# Patient Record
Sex: Male | Born: 1955
Health system: Southern US, Community
[De-identification: ages and names within clinical notes are randomized; demographics above are authoritative.]

## PROBLEM LIST (undated history)

## (undated) DIAGNOSIS — E119 Type 2 diabetes mellitus without complications: Secondary | ICD-10-CM

## (undated) DIAGNOSIS — Z972 Presence of dental prosthetic device (complete) (partial): Secondary | ICD-10-CM

## (undated) DIAGNOSIS — R350 Frequency of micturition: Secondary | ICD-10-CM

## (undated) DIAGNOSIS — I10 Essential (primary) hypertension: Secondary | ICD-10-CM

## (undated) DIAGNOSIS — R3915 Urgency of urination: Secondary | ICD-10-CM

## (undated) DIAGNOSIS — Q631 Lobulated, fused and horseshoe kidney: Secondary | ICD-10-CM

## (undated) DIAGNOSIS — K219 Gastro-esophageal reflux disease without esophagitis: Secondary | ICD-10-CM

## (undated) DIAGNOSIS — N200 Calculus of kidney: Secondary | ICD-10-CM

---

## 2003-06-05 HISTORY — PX: PERCUTANEOUS NEPHROLITHOTRIPSY: SHX2206

## 2013-08-14 ENCOUNTER — Telehealth: Payer: Self-pay | Admitting: Family Medicine

## 2013-08-14 NOTE — Telephone Encounter (Signed)
appt on the 18

## 2013-08-19 ENCOUNTER — Ambulatory Visit (INDEPENDENT_AMBULATORY_CARE_PROVIDER_SITE_OTHER): Payer: BC Managed Care – PPO | Admitting: Family Medicine

## 2013-08-19 VITALS — BP 150/80 | HR 67 | Temp 97.1°F | Ht 72.0 in | Wt 159.0 lb

## 2013-08-19 DIAGNOSIS — Z139 Encounter for screening, unspecified: Secondary | ICD-10-CM

## 2013-08-19 DIAGNOSIS — K219 Gastro-esophageal reflux disease without esophagitis: Secondary | ICD-10-CM

## 2013-08-19 DIAGNOSIS — I10 Essential (primary) hypertension: Secondary | ICD-10-CM

## 2013-08-19 DIAGNOSIS — Z0000 Encounter for general adult medical examination without abnormal findings: Secondary | ICD-10-CM

## 2013-08-19 DIAGNOSIS — R319 Hematuria, unspecified: Secondary | ICD-10-CM

## 2013-08-19 DIAGNOSIS — E119 Type 2 diabetes mellitus without complications: Secondary | ICD-10-CM

## 2013-08-19 LAB — POCT UA - MICROSCOPIC ONLY
Bacteria, U Microscopic: NEGATIVE
Casts, Ur, LPF, POC: NEGATIVE
Crystals, Ur, HPF, POC: NEGATIVE
Mucus, UA: NEGATIVE
WBC, Ur, HPF, POC: NEGATIVE
Yeast, UA: NEGATIVE

## 2013-08-19 LAB — POCT URINALYSIS DIPSTICK
Bilirubin, UA: NEGATIVE
Glucose, UA: NEGATIVE
Ketones, UA: NEGATIVE
Leukocytes, UA: NEGATIVE
Nitrite, UA: NEGATIVE
Protein, UA: NEGATIVE
Spec Grav, UA: 1.01
Urobilinogen, UA: NEGATIVE
pH, UA: 6.5

## 2013-08-19 LAB — POCT UA - MICROALBUMIN: Microalbumin Ur, POC: 20 mg/L

## 2013-08-19 LAB — POCT CBC
Granulocyte percent: 62 %G (ref 37–80)
HCT, POC: 41 % — AB (ref 43.5–53.7)
Hemoglobin: 13 g/dL — AB (ref 14.1–18.1)
Lymph, poc: 1.3 (ref 0.6–3.4)
MCH, POC: 27.2 pg (ref 27–31.2)
MCHC: 31.7 g/dL — AB (ref 31.8–35.4)
MCV: 85.5 fL (ref 80–97)
MPV: 7.3 fL (ref 0–99.8)
POC Granulocyte: 2.5 (ref 2–6.9)
POC LYMPH PERCENT: 33.7 %L (ref 10–50)
Platelet Count, POC: 182 10*3/uL (ref 142–424)
RBC: 4.8 M/uL (ref 4.69–6.13)
RDW, POC: 13.7 %
WBC: 4 10*3/uL — AB (ref 4.6–10.2)

## 2013-08-19 LAB — GLUCOSE, POCT (MANUAL RESULT ENTRY): POC Glucose: 401 mg/dl — AB (ref 70–99)

## 2013-08-19 LAB — POCT GLYCOSYLATED HEMOGLOBIN (HGB A1C): Hemoglobin A1C: 13

## 2013-08-19 MED ORDER — LISINOPRIL 10 MG PO TABS: 10.0000 mg | ORAL_TABLET | Freq: Every day | ORAL | Status: DC

## 2013-08-19 NOTE — Progress Notes (Signed)
Subjective:    Patient ID: Philip Richardson, male    DOB: 28-Nov-1955, 58 y.o.   MRN: [id number]  HPI  This 58 y.o. male presents for evaluation of CPE. o He has hx of diabetes and hypertension and he quit taking his meds because it made him feel funny.  He has not had colonoscopy. He has been feeling sluggish and he has been losing weight.  He has been taking some keflex and  tobrex eye gtt's for a stye on his left eye.  Review of Systems C/o stye left eye and feeling sluggish.   No chest pain, SOB, HA, dizziness, vision change, N/V, diarrhea, constipation, dysuria, urinary urgency or frequency, myalgias, arthralgias or rash.  Objective:   Physical Exam Vital signs noted  Well developed well nourished male.  HEENT - Head atraumatic Normocephalic                Eyes - PERRLA, Conjuctiva - clear Sclera- Clear EOMI.  Stye left eye                Ears - EAC's Wnl TM's Wnl Gross Hearing WNL                Nose - Nares patent                 Throat - oropharanx wnl Respiratory - Lungs CTA bilateral Cardiac - RRR S1 and S2 without murmur GI - Abdomen soft Nontender and bowel sounds active x 4 Extremities - No edema. Neuro - Grossly intact.   Results for orders placed in visit on 08/19/13  POCT GLYCOSYLATED HEMOGLOBIN (HGB A1C)      Result Value Ref Range   Hemoglobin A1C 13.0    POCT CBC      Result Value Ref Range   WBC 4.0 (*) 4.6 - 10.2 K/uL   Lymph, poc 1.3  0.6 - 3.4   POC LYMPH PERCENT 33.7  10 - 50 %L   POC Granulocyte 2.5  2 - 6.9   Granulocyte percent 62.0  37 - 80 %G   RBC 4.8  4.69 - 6.13 M/uL   Hemoglobin 13.0 (*) 14.1 - 18.1 g/dL   HCT, POC 41.0 (*) 43.5 - 53.7 %   MCV 85.5  80 - 97 fL   MCH, POC 27.2  27 - 31.2 pg   MCHC 31.7 (*) 31.8 - 35.4 g/dL   RDW, POC 13.7     Platelet Count, POC 182.0  142 - 424 K/uL   MPV 7.3  0 - 99.8 fL  POCT UA - MICROALBUMIN      Result Value Ref Range   Microalbumin Ur, POC 20    POCT UA - MICROSCOPIC ONLY      Result Value  Ref Range   WBC, Ur, HPF, POC neg     RBC, urine, microscopic 30-40     Bacteria, U Microscopic neg     Mucus, UA neg     Epithelial cells, urine per micros occ     Crystals, Ur, HPF, POC neg     Casts, Ur, LPF, POC neg     Yeast, UA neg    POCT URINALYSIS DIPSTICK      Result Value Ref Range   Color, UA yellow     Clarity, UA clear     Glucose, UA neg     Bilirubin, UA neg     Ketones, UA neg     Spec Grav, UA 1.010  Blood, UA large     pH, UA 6.5     Protein, UA neg     Urobilinogen, UA negative     Nitrite, UA neg     Leukocytes, UA Negative    GLUCOSE, POCT (MANUAL RESULT ENTRY)      Result Value Ref Range   POC Glucose 401 (*) 70 - 99 mg/dl      Assessment & Plan:  Screening - Plan: Ambulatory referral to Gastroenterology for screening colonoscopy  Diabetes - Plan: POCT glycosylated hemoglobin (Hb A1C), POCT UA - Microalbumin, lisinopril (PRINIVIL,ZESTRIL) 10 MG tablet Novolog Pen for SSI bid - For fsbs greater than 200 give 6 units sq, for fsbs > 300 give 8 units and for  fsbs >400 give 10 units sq.  Need to make an appointment to see Tammy Eckard or [place] pharmacist for diabetes visit next week and then follow up here with me in 2 weeks.   He is able to give himself 10 unit of humalog insulin sq here in the office.  Hematuria - He has hx of kidney stones and will refer to Urology.  Essential hypertension, benign - Plan: POCT CBC, CMP14+EGFR, lisinopril (PRINIVIL,ZESTRIL) 10 MG tablet  GERD (gastroesophageal reflux disease) - Plan: Vitamin B12 and refuses rx for GERD.  He states he will take something otc  Routine general medical examination at a health care facility - Plan: POCT CBC, CMP14+EGFR, Lipid panel, Thyroid Panel With TSH, PSA, total and free, Vit D  25 hydroxy (rtn osteoporosis monitoring), Vitamin B12, POCT UA - Microscopic Only, POCT urinalysis dipstick.  Spent over 45 minutes in visit with patient today.  Lysbeth Penner FNP

## 2013-08-20 LAB — CMP14+EGFR
ALT: 17 IU/L (ref 0–44)
AST: 15 IU/L (ref 0–40)
Albumin/Globulin Ratio: 1.6 (ref 1.1–2.5)
Albumin: 4 g/dL (ref 3.5–5.5)
Alkaline Phosphatase: 97 IU/L (ref 39–117)
BUN/Creatinine Ratio: 9 (ref 9–20)
BUN: 11 mg/dL (ref 6–24)
CO2: 26 mmol/L (ref 18–29)
Calcium: 9.5 mg/dL (ref 8.7–10.2)
Chloride: 94 mmol/L — ABNORMAL LOW (ref 97–108)
Creatinine, Ser: 1.18 mg/dL (ref 0.76–1.27)
GFR calc Af Amer: 79 mL/min/{1.73_m2} (ref >59)
GFR calc non Af Amer: 68 mL/min/{1.73_m2} (ref >59)
Globulin, Total: 2.5 g/dL (ref 1.5–4.5)
Glucose: 423 mg/dL (ref 65–99)
Potassium: 5 mmol/L (ref 3.5–5.2)
Sodium: 137 mmol/L (ref 134–144)
Total Bilirubin: 0.4 mg/dL (ref 0.0–1.2)
Total Protein: 6.5 g/dL (ref 6.0–8.5)

## 2013-08-20 LAB — THYROID PANEL WITH TSH
Free Thyroxine Index: 2.1 (ref 1.2–4.9)
T3 Uptake Ratio: 28 % (ref 24–39)
T4, Total: 7.4 ug/dL (ref 4.5–12.0)
TSH: 0.582 u[IU]/mL (ref 0.450–4.500)

## 2013-08-20 LAB — PSA, TOTAL AND FREE
PSA, Free Pct: 36 %
PSA, Free: 0.18 ng/mL
PSA: 0.5 ng/mL (ref 0.0–4.0)

## 2013-08-20 LAB — LIPID PANEL
Chol/HDL Ratio: 3.7 ratio units (ref 0.0–5.0)
Cholesterol, Total: 200 mg/dL — ABNORMAL HIGH (ref 100–199)
HDL: 54 mg/dL (ref >39)
LDL Calculated: 119 mg/dL — ABNORMAL HIGH (ref 0–99)
Triglycerides: 133 mg/dL (ref 0–149)
VLDL Cholesterol Cal: 27 mg/dL (ref 5–40)

## 2013-08-20 LAB — MICROALBUMIN, URINE: Microalbumin, Urine: 64.1 ug/mL — ABNORMAL HIGH (ref 0.0–17.0)

## 2013-08-20 LAB — VITAMIN B12: Vitamin B-12: 675 pg/mL (ref 211–946)

## 2013-08-20 LAB — VITAMIN D 25 HYDROXY (VIT D DEFICIENCY, FRACTURES): Vit D, 25-Hydroxy: 44.4 ng/mL (ref 30.0–100.0)

## 2013-08-21 ENCOUNTER — Other Ambulatory Visit: Payer: Self-pay | Admitting: Family Medicine

## 2013-08-21 ENCOUNTER — Telehealth: Payer: Self-pay | Admitting: Family Medicine

## 2013-08-21 MED ORDER — PRAVASTATIN SODIUM 40 MG PO TABS: 40.0000 mg | ORAL_TABLET | Freq: Every day | ORAL | Status: DC

## 2013-08-21 NOTE — Telephone Encounter (Signed)
Message copied by Waverly Ferrari on Fri Aug 21, 2013  3:24 PM ------      Message from: Lysbeth Penner      Created: Fri Aug 21, 2013  1:12 PM       Start pravachol for elevated cholesterol since he is diabetic ------

## 2013-08-25 ENCOUNTER — Ambulatory Visit (INDEPENDENT_AMBULATORY_CARE_PROVIDER_SITE_OTHER): Payer: BC Managed Care – PPO | Admitting: Pharmacist Clinician (PhC)/ Clinical Pharmacy Specialist

## 2013-08-25 DIAGNOSIS — IMO0001 Reserved for inherently not codable concepts without codable children: Secondary | ICD-10-CM

## 2013-08-25 DIAGNOSIS — E1165 Type 2 diabetes mellitus with hyperglycemia: Secondary | ICD-10-CM

## 2013-08-25 DIAGNOSIS — IMO0002 Reserved for concepts with insufficient information to code with codable children: Secondary | ICD-10-CM

## 2013-08-25 NOTE — Progress Notes (Signed)
Patient came in today at 11:55 for an 11:30 appointment and stated he had to be across town at the eye doctor by 12:30.  He was upset and frustrated that he did not know how to use his insulin pens or blood glucometer.  I went through slowly with him all the steps for the monitor and pen injection.  He gave himself a shot of novolog 10 units in office since his BG was 340mg /dL.  He had now taken any insulin since last week because he did not know how to check his blood sugar.  Patient asked many good questions.  I also reveiwed briefly a diabetic diet and CHO meal planning and printed a diet out for him to follow.  We talked about exercise as well.  He would like to get off insulin in the future and take oral medications, which I think is very doable.  He comes back next week to see Bill.

## 2013-08-26 ENCOUNTER — Encounter (INDEPENDENT_AMBULATORY_CARE_PROVIDER_SITE_OTHER): Payer: Self-pay | Admitting: *Deleted

## 2013-08-29 LAB — HM DIABETES EYE EXAM

## 2013-09-01 ENCOUNTER — Ambulatory Visit: Payer: Self-pay | Admitting: Family Medicine

## 2013-09-02 ENCOUNTER — Ambulatory Visit: Payer: Self-pay | Admitting: Family Medicine

## 2013-09-08 ENCOUNTER — Ambulatory Visit: Payer: Self-pay | Admitting: Family Medicine

## 2013-09-09 ENCOUNTER — Ambulatory Visit (INDEPENDENT_AMBULATORY_CARE_PROVIDER_SITE_OTHER): Payer: BC Managed Care – PPO | Admitting: General Practice

## 2013-09-09 ENCOUNTER — Ambulatory Visit: Payer: BC Managed Care – PPO | Admitting: Family Medicine

## 2013-09-09 ENCOUNTER — Encounter: Payer: Self-pay | Admitting: General Practice

## 2013-09-09 VITALS — BP 133/82 | HR 60 | Temp 97.6°F | Ht 72.0 in | Wt 160.6 lb

## 2013-09-09 DIAGNOSIS — E119 Type 2 diabetes mellitus without complications: Secondary | ICD-10-CM

## 2013-09-09 MED ORDER — LINAGLIPTIN-METFORMIN HCL 2.5-1000 MG PO TABS: 1.0000 | ORAL_TABLET | Freq: Two times a day (BID) | ORAL | Status: DC

## 2013-09-09 NOTE — Patient Instructions (Signed)
Diabetes and Foot Care Diabetes may cause you to have problems because of poor blood supply (circulation) to your feet and legs. This may cause the skin on your feet to become thinner, break easier, and heal more slowly. Your skin may become dry, and the skin may peel and crack. You may also have nerve damage in your legs and feet causing decreased feeling in them. You may not notice minor injuries to your feet that could lead to infections or more serious problems. Taking care of your feet is one of the most important things you can do for yourself.  HOME CARE INSTRUCTIONS  Wear shoes at all times, even in the house. Do not go barefoot. Bare feet are easily injured.  Check your feet daily for blisters, cuts, and redness. If you cannot see the bottom of your feet, use a mirror or ask someone for help.  Wash your feet with warm water (do not use hot water) and mild soap. Then pat your feet and the areas between your toes until they are completely dry. Do not soak your feet as this can dry your skin.  Apply a moisturizing lotion or petroleum jelly (that does not contain alcohol and is unscented) to the skin on your feet and to dry, brittle toenails. Do not apply lotion between your toes.  Trim your toenails straight across. Do not dig under them or around the cuticle. File the edges of your nails with an emery board or nail file.  Do not cut corns or calluses or try to remove them with medicine.  Wear clean socks or stockings every day. Make sure they are not too tight. Do not wear knee-high stockings since they may decrease blood flow to your legs.  Wear shoes that fit properly and have enough cushioning. To break in new shoes, wear them for just a few hours a day. This prevents you from injuring your feet. Always look in your shoes before you put them on to be sure there are no objects inside.  Do not cross your legs. This may decrease the blood flow to your feet.  If you find a minor scrape,  cut, or break in the skin on your feet, keep it and the skin around it clean and dry. These areas may be cleansed with mild soap and water. Do not cleanse the area with peroxide, alcohol, or iodine.  When you remove an adhesive bandage, be sure not to damage the skin around it.  If you have a wound, look at it several times a day to make sure it is healing.  Do not use heating pads or hot water bottles. They may burn your skin. If you have lost feeling in your feet or legs, you may not know it is happening until it is too late.  Make sure your health care provider performs a complete foot exam at least annually or more often if you have foot problems. Report any cuts, sores, or bruises to your health care provider immediately. SEEK MEDICAL CARE IF:   You have an injury that is not healing.  You have cuts or breaks in the skin.  You have an ingrown nail.  You notice redness on your legs or feet.  You feel burning or tingling in your legs or feet.  You have pain or cramps in your legs and feet.  Your legs or feet are numb.  Your feet always feel cold. SEEK IMMEDIATE MEDICAL CARE IF:   There is increasing redness,   swelling, or pain in or around a wound.  There is a red line that goes up your leg.  Pus is coming from a wound.  You develop a fever or as directed by your health care provider.  You notice a bad smell coming from an ulcer or wound. Document Released: 05/18/2000 Document Revised: 01/21/2013 Document Reviewed: 10/28/2012 Exit[employer] Patient Information 2014 [employer].  

## 2013-09-09 NOTE — Progress Notes (Signed)
   Subjective:    Patient ID: Philip Richardson, male    DOB: December 05, 1955, 58 y.o.   MRN: [id number]  HPI Patient presents today for follow up diabetes. Newly diagnosed on 08/19/13, taking blood sugars 2 times daily and ranges less than 200. He reports still no being comfortable with administering insulin. Also works two jobs and difficult to check blood sugars and administer insulin. Patient reports trying to eat healthier since speaking with pharmacist about nutrition.     Review of Systems  Constitutional: Negative for fever and chills.  Respiratory: Negative for chest tightness and shortness of breath.   Cardiovascular: Negative for chest pain and palpitations.       Objective:   Physical Exam  Constitutional: He is oriented to person, place, and time. He appears well-developed and well-nourished.  Cardiovascular: Normal rate, regular rhythm and normal heart sounds.   Pulmonary/Chest: Effort normal and breath sounds normal. No respiratory distress. He exhibits no tenderness.  Neurological: He is alert and oriented to person, place, and time.  Skin: Skin is warm and dry.  Psychiatric: He has a normal mood and affect.          Assessment & Plan:  1. Diabetes - Linagliptin-Metformin HCl 2.10-998 MG TABS; Take 1 tablet by mouth 2 (two) times daily with a meal.  Dispense: 56 tablet; Refill: 0 -discontinue insulin  -discussed keeping diary of blood sugars -discussed health eating and regular exercise -RTO in 1 month for labs and sooner if blood sugars elevated (as discussed) -discussed if HgbA1c hasn't improved, he may need to be restarted on insulin -Patient verbalized understanding and [place], FNP-C

## 2013-09-10 DIAGNOSIS — E119 Type 2 diabetes mellitus without complications: Secondary | ICD-10-CM | POA: Insufficient documentation

## 2013-09-23 ENCOUNTER — Other Ambulatory Visit: Payer: Self-pay | Admitting: Urology

## 2013-09-28 ENCOUNTER — Encounter (HOSPITAL_BASED_OUTPATIENT_CLINIC_OR_DEPARTMENT_OTHER): Payer: Self-pay | Admitting: *Deleted

## 2013-09-29 ENCOUNTER — Encounter (HOSPITAL_BASED_OUTPATIENT_CLINIC_OR_DEPARTMENT_OTHER): Payer: Self-pay | Admitting: *Deleted

## 2013-09-29 NOTE — Progress Notes (Signed)
NPO AFTER MN WITH EXCEPTION CLEAR LIQUIDS UNTIL 0900 (NO CREAM/ MILK PRODUCTS).  ARRIVE AT 1330. NEEDS ISTAT 8 AND EKG.

## 2013-09-30 ENCOUNTER — Other Ambulatory Visit: Payer: Self-pay

## 2013-09-30 ENCOUNTER — Ambulatory Visit (HOSPITAL_COMMUNITY): Payer: BC Managed Care – PPO

## 2013-09-30 ENCOUNTER — Encounter (HOSPITAL_BASED_OUTPATIENT_CLINIC_OR_DEPARTMENT_OTHER)
Admission: RE | Disposition: A | Discharge status: Home / Self Care | Payer: Self-pay | Source: Ambulatory Visit | Attending: Urology

## 2013-09-30 ENCOUNTER — Encounter (HOSPITAL_BASED_OUTPATIENT_CLINIC_OR_DEPARTMENT_OTHER): Payer: BC Managed Care – PPO | Admitting: Anesthesiology

## 2013-09-30 ENCOUNTER — Encounter (HOSPITAL_BASED_OUTPATIENT_CLINIC_OR_DEPARTMENT_OTHER): Payer: Self-pay

## 2013-09-30 ENCOUNTER — Ambulatory Visit (HOSPITAL_BASED_OUTPATIENT_CLINIC_OR_DEPARTMENT_OTHER): Payer: BC Managed Care – PPO | Admitting: Anesthesiology

## 2013-09-30 ENCOUNTER — Ambulatory Visit (HOSPITAL_BASED_OUTPATIENT_CLINIC_OR_DEPARTMENT_OTHER)
Admission: RE | Admit: 2013-09-30 | Discharge: 2013-09-30 | Disposition: A | Discharge status: Home / Self Care | Payer: BC Managed Care – PPO | Source: Ambulatory Visit | Attending: Urology | Admitting: Urology

## 2013-09-30 DIAGNOSIS — K219 Gastro-esophageal reflux disease without esophagitis: Secondary | ICD-10-CM | POA: Insufficient documentation

## 2013-09-30 DIAGNOSIS — I10 Essential (primary) hypertension: Secondary | ICD-10-CM | POA: Insufficient documentation

## 2013-09-30 DIAGNOSIS — Q638 Other specified congenital malformations of kidney: Secondary | ICD-10-CM | POA: Insufficient documentation

## 2013-09-30 DIAGNOSIS — N1330 Unspecified hydronephrosis: Secondary | ICD-10-CM | POA: Insufficient documentation

## 2013-09-30 DIAGNOSIS — E119 Type 2 diabetes mellitus without complications: Secondary | ICD-10-CM | POA: Insufficient documentation

## 2013-09-30 DIAGNOSIS — N200 Calculus of kidney: Secondary | ICD-10-CM | POA: Insufficient documentation

## 2013-09-30 HISTORY — DX: Lobulated, fused and horseshoe kidney: Q63.1

## 2013-09-30 HISTORY — DX: Gastro-esophageal reflux disease without esophagitis: K21.9

## 2013-09-30 HISTORY — DX: Presence of dental prosthetic device (complete) (partial): Z97.2

## 2013-09-30 HISTORY — DX: Essential (primary) hypertension: I10

## 2013-09-30 HISTORY — DX: Calculus of kidney: N20.0

## 2013-09-30 HISTORY — DX: Urgency of urination: R39.15

## 2013-09-30 HISTORY — DX: Type 2 diabetes mellitus without complications: E11.9

## 2013-09-30 HISTORY — PX: CYSTOSCOPY WITH RETROGRADE PYELOGRAM, URETEROSCOPY AND STENT PLACEMENT: SHX5789

## 2013-09-30 HISTORY — DX: Frequency of micturition: R35.0

## 2013-09-30 LAB — POCT I-STAT, CHEM 8
BUN: 12 mg/dL (ref 6–23)
CALCIUM ION: 0.93 mmol/L — AB (ref 1.12–1.23)
CHLORIDE: 105 meq/L (ref 96–112)
Creatinine, Ser: 1 mg/dL (ref 0.50–1.35)
Glucose, Bld: 139 mg/dL — ABNORMAL HIGH (ref 70–99)
HCT: 45 % (ref 39.0–52.0)
Hemoglobin: 15.3 g/dL (ref 13.0–17.0)
Potassium: 3.6 mEq/L — ABNORMAL LOW (ref 3.7–5.3)
SODIUM: 139 meq/L (ref 137–147)
TCO2: 21 mmol/L (ref 0–100)

## 2013-09-30 SURGERY — CYSTOURETEROSCOPY, WITH RETROGRADE PYELOGRAM AND STENT INSERTION
Anesthesia: General | Site: Ureter | Laterality: Bilateral

## 2013-09-30 MED ORDER — FENTANYL CITRATE 0.05 MG/ML IJ SOLN
INTRAMUSCULAR | Status: DC | PRN
  Administered 2013-09-30 (×2): 50 ug via INTRAVENOUS

## 2013-09-30 MED ORDER — IOHEXOL 300 MG/ML  SOLN
INTRAMUSCULAR | Status: DC | PRN
  Administered 2013-09-30: 35 mL via INTRAVENOUS

## 2013-09-30 MED ORDER — MIDAZOLAM HCL 2 MG/2ML IJ SOLN
INTRAMUSCULAR | Status: AC
  Filled 2013-09-30: qty 2

## 2013-09-30 MED ORDER — DEXTROSE 5 % IV SOLN
5.0000 mg/kg | Freq: Once | INTRAVENOUS | Status: AC
  Administered 2013-09-30: 350 mg via INTRAVENOUS
  Filled 2013-09-30: qty 8.75

## 2013-09-30 MED ORDER — MIDAZOLAM HCL 5 MG/5ML IJ SOLN
INTRAMUSCULAR | Status: DC | PRN
  Administered 2013-09-30: 2 mg via INTRAVENOUS

## 2013-09-30 MED ORDER — SODIUM CHLORIDE 0.9 % IR SOLN
Status: DC | PRN
  Administered 2013-09-30: 3000 mL via INTRAVESICAL

## 2013-09-30 MED ORDER — SENNOSIDES-DOCUSATE SODIUM 8.6-50 MG PO TABS: 1.0000 | ORAL_TABLET | Freq: Two times a day (BID) | ORAL | Status: DC

## 2013-09-30 MED ORDER — SULFAMETHOXAZOLE-TMP DS 800-160 MG PO TABS: 1.0000 | ORAL_TABLET | Freq: Two times a day (BID) | ORAL | Status: DC

## 2013-09-30 MED ORDER — OXYBUTYNIN CHLORIDE 5 MG PO TABS: 5.0000 mg | ORAL_TABLET | Freq: Three times a day (TID) | ORAL | Status: DC | PRN

## 2013-09-30 MED ORDER — LIDOCAINE HCL (CARDIAC) 20 MG/ML IV SOLN
INTRAVENOUS | Status: DC | PRN
  Administered 2013-09-30: 60 mg via INTRAVENOUS

## 2013-09-30 MED ORDER — FENTANYL CITRATE 0.05 MG/ML IJ SOLN
INTRAMUSCULAR | Status: AC
  Filled 2013-09-30: qty 4

## 2013-09-30 MED ORDER — ONDANSETRON HCL 4 MG/2ML IJ SOLN
INTRAMUSCULAR | Status: DC | PRN
  Administered 2013-09-30: 4 mg via INTRAVENOUS

## 2013-09-30 MED ORDER — OXYCODONE-ACETAMINOPHEN 5-325 MG PO TABS: 1.0000 | ORAL_TABLET | Freq: Four times a day (QID) | ORAL | Status: DC | PRN

## 2013-09-30 MED ORDER — LACTATED RINGERS IV SOLN
INTRAVENOUS | Status: DC
  Administered 2013-09-30 (×2): via INTRAVENOUS
  Filled 2013-09-30: qty 1000

## 2013-09-30 MED ORDER — PROPOFOL 10 MG/ML IV BOLUS
INTRAVENOUS | Status: DC | PRN
  Administered 2013-09-30: 200 mg via INTRAVENOUS

## 2013-09-30 MED ORDER — GENTAMICIN IN SALINE 1.6-0.9 MG/ML-% IV SOLN
80.0000 mg | INTRAVENOUS | Status: DC
  Filled 2013-09-30: qty 50

## 2013-09-30 SURGICAL SUPPLY — 20 items
6 X 26 CONTOUR ×2 IMPLANT
BAG DRAIN URO-CYSTO SKYTR STRL (DRAIN) ×2 IMPLANT
BASKET LASER NITINOL 1.9FR (BASKET) ×2 IMPLANT
BASKET ZERO TIP NITINOL 2.4FR (BASKET) IMPLANT
CANISTER SUCT LVC 12 LTR MEDI- (MISCELLANEOUS) ×2 IMPLANT
CATH INTERMIT  6FR 70CM (CATHETERS) ×2 IMPLANT
CLOTH BEACON ORANGE TIMEOUT ST (SAFETY) ×2 IMPLANT
DRAPE CAMERA CLOSED 9X96 (DRAPES) ×2 IMPLANT
GLOVE BIO SURGEON STRL SZ7.5 (GLOVE) ×2 IMPLANT
GLOVE INDICATOR 7.5 STRL GRN (GLOVE) ×2 IMPLANT
GLOVE SURG SS PI 7.5 STRL IVOR (GLOVE) ×2 IMPLANT
GOWN STRL REUS W/ TWL XL LVL3 (GOWN DISPOSABLE) ×1 IMPLANT
GOWN STRL REUS W/TWL XL LVL3 (GOWN DISPOSABLE) ×3 IMPLANT
GUIDEWIRE ANG ZIPWIRE 038X150 (WIRE) ×2 IMPLANT
GUIDEWIRE STR DUAL SENSOR (WIRE) ×2 IMPLANT
IV NS IRRIG 3000ML ARTHROMATIC (IV SOLUTION) ×2 IMPLANT
PACK CYSTOSCOPY (CUSTOM PROCEDURE TRAY) ×2 IMPLANT
STENT URET 6FRX26 CONTOUR (STENTS) ×2 IMPLANT
SYRINGE 10CC LL (SYRINGE) ×2 IMPLANT
TUBE FEEDING 8FR 16IN STR KANG (MISCELLANEOUS) ×2 IMPLANT

## 2013-09-30 NOTE — Anesthesia Postprocedure Evaluation (Signed)
Anesthesia Post Note  Patient: Philip Richardson  Procedure(s) Performed: Procedure(s) (LRB): CYSTOSCOPY WITH BILATERAL RETROGRADE PYELOGRAM, LEFT DIAGNOSTIC URETEROSCOPY AND Left ureteral stent (Bilateral)  Anesthesia type: General  Patient location: PACU  Post pain: Pain level controlled  Post assessment: Post-op Vital signs reviewed  Last Vitals: BP 149/85  Pulse 58  Temp(Src) 36.3 C (Oral)  Resp 10  Ht 6\' 1"  (1.854 m)  Wt 156 lb (70.761 kg)  BMI 20.59 kg/m2  SpO2 100%  Post vital signs: Reviewed  Level of consciousness: sedated  Complications: No apparent anesthesia complications

## 2013-09-30 NOTE — Anesthesia Preprocedure Evaluation (Signed)
Anesthesia Evaluation  Patient identified by MRN, date of birth, ID band Patient awake    Reviewed: Allergy & Precautions, H&P , NPO status , Patient's Chart, lab work & pertinent test results  Airway       Dental   Pulmonary neg pulmonary ROS,          Cardiovascular hypertension, negative cardio ROS      Neuro/Psych negative neurological ROS  negative psych ROS   GI/Hepatic negative GI ROS, Neg liver ROS, GERD-  ,  Endo/Other  negative endocrine ROSdiabetes  Renal/GU Renal diseasenegative Renal ROS     Musculoskeletal negative musculoskeletal ROS (+)   Abdominal   Peds  Hematology negative hematology ROS (+)   Anesthesia Other Findings   Reproductive/Obstetrics                           Anesthesia Physical Anesthesia Plan  ASA: II  Anesthesia Plan: General   Post-op Pain Management:    Induction: Intravenous  Airway Management Planned: LMA  Additional Equipment:   Intra-op Plan:   Post-operative Plan: Extubation in OR  Informed Consent: I have reviewed the patients History and Physical, chart, labs and discussed the procedure including the risks, benefits and alternatives for the proposed anesthesia with the patient or authorized representative who has indicated his/her understanding and acceptance.   Dental advisory given  Plan Discussed with: CRNA  Anesthesia Plan Comments:         Anesthesia Quick Evaluation

## 2013-09-30 NOTE — Brief Op Note (Signed)
09/30/2013  4:35 PM  PATIENT:  Zaccary Rhodus  58 y.o. male  PRE-OPERATIVE DIAGNOSIS:  BILATERAL STONES IN A HORSESHOE KIDNEY  POST-OPERATIVE DIAGNOSIS:  BILATERAL STONES IN A HORSESHOE KIDNEY  PROCEDURE:  Procedure(s): CYSTOSCOPY WITH BILATERAL RETROGRADE PYELOGRAM, LEFT DIAGNOSTIC URETEROSCOPY AND Left ureteral stent (Bilateral)  SURGEON:  Surgeon(s) and Role:    * Alexis Frock, MD - Primary  PHYSICIAN ASSISTANT:   ASSISTANTS: none   ANESTHESIA:   general  EBL:  Total I/O In: 300 [I.V.:300] Out: -   BLOOD ADMINISTERED:none  DRAINS: none   LOCAL MEDICATIONS USED:  NONE  SPECIMEN:  No Specimen  DISPOSITION OF SPECIMEN:  N/A  COUNTS:  YES  TOURNIQUET:  * No tourniquets in log *  DICTATION: .Other Dictation: Dictation Number [id number]  PLAN OF CARE: Discharge to home after PACU  PATIENT DISPOSITION:  PACU - hemodynamically stable.   Delay start of Pharmacological VTE agent (>24hrs) due to surgical blood loss or risk of bleeding: not applicable

## 2013-09-30 NOTE — Discharge Instructions (Signed)
1 - You may have urinary urgency (bladder spasms) and bloody urine on / off with stent in place. This is normal.  2 - Call MD or go to ER for fever >102, severe pain / nausea / vomiting not relieved by medications, or acute change in medical status  3 - Remove tethered stent on Friday morning at home by pulling on string, then blue plastic tubing, and discarding. Dr. Tresa Moore is in the office Friday if any issues arise.   [facility] [phone] Post Ureteroscopy With or Without Stent Instructions  Definitions:  Ureter: The duct that transports urine from the kidney to the bladder. Stent:   A plastic hollow tube that is placed into the ureter, from the kidney to the                 bladder to prevent the ureter from swelling shut.  GENERAL INSTRUCTIONS:  Despite the fact that no skin incisions were used, the area around the ureter and bladder is raw and irritated. The stent is a foreign body which will further irritate the bladder wall. This irritation is manifested by increased frequency of urination, both day and night, and by an increase in the urge to urinate. In some, the urge to urinate is present almost always. Sometimes the urge is strong enough that you may not be able to stop yourself from urinating. The only real cure is to remove the stent and then give time for the bladder wall to heal which can't be done until the danger of the ureter swelling shut has passed, which varies.  You may see some blood in your urine while the stent is in place and a few days afterwards. Do not be alarmed, even if the urine was clear for a while. Get off your feet and drink lots of fluids until clearing occurs. If you start to pass clots or don't improve, call us.  DIET: You may return to your normal diet immediately. Because of the raw surface of your bladder, alcohol, spicy foods, acid type foods and drinks with caffeine may cause irritation or frequency and should be used in  moderation. To keep your urine flowing freely and to avoid constipation, drink plenty of fluids during the day ( 8-10 glasses ). Tip: Avoid cranberry juice because it is very acidic.  ACTIVITY: Your physical activity doesn't need to be restricted. However, if you are very active, you may see some blood in your urine. We suggest that you reduce your activity under these circumstances until the bleeding has stopped.  BOWELS: It is important to keep your bowels regular during the postoperative period. Straining with bowel movements can cause bleeding. A bowel movement every other day is reasonable. Use a mild laxative if needed, such as Milk of Magnesia 2-3 tablespoons, or 2 Dulcolax tablets. Call if you continue to have problems. If you have been taking narcotics for pain, before, during or after your surgery, you may be constipated. Take a laxative if necessary.   MEDICATION: You should resume your pre-surgery medications unless told not to. In addition you will often be given an antibiotic to prevent infection. These should be taken as prescribed until the bottles are finished unless you are having an unusual reaction to one of the drugs.  PROBLEMS YOU SHOULD REPORT TO [place]:  Fevers over 100.5 Fahrenheit.  Heavy bleeding, or clots ( See above notes about blood in urine ).  Inability to urinate.  Drug reactions ( hives, rash, nausea, vomiting,  diarrhea ).  Severe burning or pain with urination that is not improving.  FOLLOW-UP: You will need a follow-up appointment to monitor your progress. Call for this appointment at the number listed above. Usually the first appointment will be about three to fourteen days after your surgery.      Post Anesthesia Home Care Instructions  Activity: Get plenty of rest for the remainder of the day. A responsible adult should stay with you for 24 hours following the procedure.  For the next 24 hours, DO NOT: -Drive a car -Paediatric nurse -Drink  alcoholic beverages -Take any medication unless instructed by your physician -Make any legal decisions or sign important papers.  Meals: Start with liquid foods such as gelatin or soup. Progress to regular foods as tolerated. Avoid greasy, spicy, heavy foods. If nausea and/or vomiting occur, drink only clear liquids until the nausea and/or vomiting subsides. Call your physician if vomiting continues.  Special Instructions/Symptoms: Your throat may feel dry or sore from the anesthesia or the breathing tube placed in your throat during surgery. If this causes discomfort, gargle with warm salt water. The discomfort should disappear within 24 hours.

## 2013-09-30 NOTE — Anesthesia Preprocedure Evaluation (Deleted)
Anesthesia Evaluation    Airway       Dental   Pulmonary          Cardiovascular hypertension, II    Neuro/Psych    GI/Hepatic   Endo/Other  diabetes  Renal/GU      Musculoskeletal   Abdominal   Peds  Hematology   Anesthesia Other Findings   Reproductive/Obstetrics                          Anesthesia Physical Anesthesia Plan Anesthesia Quick Evaluation

## 2013-09-30 NOTE — H&P (Signed)
Philip Richardson is an 58 y.o. male.    Chief Complaint: Pre-Op Diagnostic Ureteroscopy  HPI:   1 - Bilateral Large Nephrolithiasis in Horseshoe Kidney -  Pre 2015 - Rt PCNL 2005 at [facility] 09/2013 - Rt 2.6cm, renal pelvis stone, Left 1.7cm renal pelvis stone with hydro. Stoness >1500HU by CT on eval for hematuria.  2 - Horsehoe Kidney - Complete horsehoe kidney, not-pelvic location. Complex renovascular anatomy with Rt 1 upper early branch artery , Rt 1 lower posterior artery, Rt upper and lower veins, Lt 1 upper artery, Lt 1 lower posterior artery, Lt 1 small vein upper, Lt 1 large mid retroaortic vein. High UPJ insertion bilaterally.  3 - Left Hydronephrosis / Possible UPJ Obsturction - Mod lefft hydro by CT 2014 w/o obvious stone at UPJ / ureter raising quesion of ureteral stricture / mass/ UPJO.  4 - Prostate Screening - Gets annually by PCP. PSA 0.5 2015.  5 - Microscopic Hematuria - Non- smoker. No dye / textile exposure. CT Urogram 09/2013 with stones and left hydro above, cysto pending.  6 - Medical Stone Disease -  Eval 2015: BMP,PTH,Urate - normal; Composition - pending; 24 hr urines - pending  PMH sig for HTN, DM2. No CV disease. No strong blood thinners.  Today Philip Richardson is seen to proceed with cysto, retrogrades, and diagnostic left ureteroscopy.  Past Medical History  Diagnosis Date  . Type 2 diabetes mellitus   . Hypertension   . GERD (gastroesophageal reflux disease)   . Renal calculus, bilateral   . Horseshoe kidney     BILATERAL  . Frequency of urination   . Urgency of urination   . Wears dentures     Past Surgical History  Procedure Laterality Date  . Percutaneous nephrolithotripsy  2005    History reviewed. No pertinent family history. Social History:  reports that he has never smoked. He has never used smokeless tobacco. He reports that he does not drink alcohol or use illicit drugs.  Allergies: No Known Allergies  No prescriptions prior to admission     No results found for this or any previous visit (from the past 48 hour(s)). No results found.  Review of Systems  Constitutional: Negative.  Negative for fever and chills.  HENT: Negative.   Eyes: Negative.   Respiratory: Negative.   Cardiovascular: Negative.   Gastrointestinal: Negative.   Genitourinary: Negative.   Musculoskeletal: Negative.   Skin: Negative.   Neurological: Negative.   Endo/Heme/Allergies: Negative.   Psychiatric/Behavioral: Negative.     Height 6\' 1"  (1.854 m), weight 72.576 kg (160 lb). Physical Exam  Constitutional: He is oriented to person, place, and time. He appears well-developed and well-nourished.  HENT:  Head: Normocephalic and atraumatic.  Eyes: EOM are normal. Pupils are equal, round, and reactive to light.  Neck: Normal range of motion.  Cardiovascular: Normal rate.   Respiratory: Effort normal.  GI: Soft. Bowel sounds are normal.  Genitourinary: Penis normal.  No CVAT  Musculoskeletal: Normal range of motion.  Neurological: He is alert and oriented to person, place, and time.  Skin: Skin is warm and dry.  Psychiatric: He has a normal mood and affect. His behavior is normal. Judgment and thought content normal.     Assessment/Plan  1 - Bilateral Large Nephrolithiasis in Horseshoe Kidney - Very challenging case. I feel percutaneous approach not ideal given very narrow window to kidney and stones in very anterior location. Staged ureteroscopy an option, but may be technically very challenging and would  require MANY stages (at least 4). Agree robotic pyelolithotomy likely most definitive and efficient. Recommend operative cysto, retrogrades, and left diagnostic ureteroscopy prior to rule out sig left UPJO / stricture that would require concomitant pyeloplasty and maxiamlly rule out any ureteral mass given thickening of proximal ureter and gross hematuria.  We rediscussed ureteroscoy in detail.  We rediscussed risks including bleeding,  infection, damage to kidney / ureter  bladder, rarely loss of kidney. We rediscussed anesthetic risks and rare but serious surgical complications including DVT, PE, MI, and mortality. We specifically readdressed that in 5-10% of cases a staged approach is required with stenting followed by re-attempt ureteroscopy if anatomy unfavorable. The patient voiced understanding and wises to proceed.   2 - Horseshoe Kidney - Rare anatomic varient, likely poor intra-renal drainage driving stone risk substantially. Fortunately good overall renal function.   3 - Left Hydronephrosis / Possible UPJ Obsturction - Proceed with left ureteroscopy as per above.  4 - Prostate Screening - up to date this year.  5 - Microscopic Hematuria - Will complete eval with cysto, retrogrades, left ureteroscopy as per above.  6 - Medical Stone Disease - serum studies normal, composition and 24 hr urines to follow.   Alexis Frock 09/30/2013, 6:51 AM

## 2013-09-30 NOTE — Transfer of Care (Signed)
Immediate Anesthesia Transfer of Care Note  Patient: Philip Richardson  Procedure(s) Performed: Procedure(s) (LRB): CYSTOSCOPY WITH BILATERAL RETROGRADE PYELOGRAM, LEFT DIAGNOSTIC URETEROSCOPY AND Left ureteral stent (Bilateral)  Patient Location: PACU  Anesthesia Type: General  Level of Consciousness: awake, oriented, sedated and patient cooperative  Airway & Oxygen Therapy: Patient Spontanous Breathing and Patient connected to face mask oxygen  Post-op Assessment: Report given to PACU RN and Post -op Vital signs reviewed and stable  Post vital signs: Reviewed and stable  Complications: No apparent anesthesia complications

## 2013-10-01 LAB — GLUCOSE, CAPILLARY: GLUCOSE-CAPILLARY: 126 mg/dL — AB (ref 70–99)

## 2013-10-05 ENCOUNTER — Encounter (HOSPITAL_BASED_OUTPATIENT_CLINIC_OR_DEPARTMENT_OTHER): Payer: Self-pay | Admitting: Urology

## 2013-10-05 NOTE — Op Note (Signed)
NAMEGARDY, LETIZIA NO.:  [id number]  MEDICAL RECORD NO.:  [id number]  LOCATION:                                 FACILITY:  PHYSICIAN:  Alexis Frock, MD     DATE OF BIRTH:  07-07-1955  DATE OF PROCEDURE:  09/30/2013 DATE OF DISCHARGE:  09/30/2013                              OPERATIVE REPORT   DIAGNOSES:  Horseshoe kidney bilateral large volume, nephrolithiasis, questionable left ureteropelvic junction obstruction, filling defect.  PROCEDURES: 1. Cystoscopy, bilateral retrograde pyelogram with interpretation. 2. Diagnostic left ureteroscopy. 3. Insertion of left ureteral stent 6 x 26 with tether.  FINDINGS: 1. Unremarkable urinary bladder. 2. Right retrograde pyelogram with posterior insertion of the ureter     consistent with a horseshoe kidney. 3. Very abnormal left retrograde pyelogram with moderate-to-severe     hydronephrosis, large filling defect in the renal pelvis consistent     with known stone, questionable filling defect on retrograde     pyelogram. 4. Area of sequential filling defects found to be stone without     evidence whatsoever of intraluminal, extraluminal obstruction of     the UPJ upon ureteroscopic vision.  ESTIMATED BLOOD LOSS:  Nil.  INDICATION:  Mr. Philip Richardson is a very pleasant 58 year old gentleman with known history of bilateral reurrent nephrolithiasis and horseshoe kidney. He has undergone previous multiple procedures for this.  He re-presented with a very large volume of nephrolithiasis, having been lost followup for a number of years.  He had a significant left-sided hydronephrosis. Given the unusual orientation of his kidneys and the stone disease and of his ureters, it was felt that likely a robotic pyelolithotomy would be the most efficient means of definitive stone management, however, on his recent imaging, it was a questionable filling defect of the left UPJ worrisome for possible stricture versus mass versus  UPJ obstruction and further rule this out, so the diagnostic ureteroscopy was warranted. Informed consent was obtained and placed in medical record.  PROCEDURE IN DETAIL:  The patient being Salaam Ceresa was verified. Procedure being cysto, bilateral retrograde pyelogram, left diagnostic ureteroscopy was confirmed.  Procedure was carried out.  Time-out was performed.  Intravenous antibiotics were administered.  General LMA anesthesia was introduced.  The patient placed into a low-lithotomy position, and sterile field was created by prepping and draping the patient's penis, perineum, proximal thighs using iodine x3.  Next, cystourethroscopy was performed using a 22-French rigid cystoscope with 12-degree offset lens.  Inspection of anterior and posterior urethra unremarkable.  Inspection of bladder revealed mild trabeculation. Ureteral orifices were in the normal anatomic position.  The right ureteral orifice was cannulated with a 6-French Foley catheter and right retrograde pyelogram was obtained.  Right retrograde pyelogram demonstrated a single right ureter with single system right kidney.  There was abnormal likely posterior insertion with splaying of calyces in many directions consistent with known hemi-horseshoe kidney on the right.  There was no obvious filling defects, narrowing, or hydronephrosis.  Next, the left ureteral orifice was cannulated with a 6-French Foley catheter and left retrograde pyelogram was obtained.  Left retrograde pyelogram demonstrated a single left ureter with single system left kidney.  There was splaying  in multiple calices consistent with known horseshoe kidney.  There was significant hydronephrosis without ureteral nephrosis.  There was questionable narrowing or filling defect in the area of the UPJ on retrograde pyelography, and there was felt that diagnostic ureteroscopy was still warranted.  As such, a 0.03 Glidewire advanced at the level of upper  pole, set aside as a safety wire.  Next, semi-rigid ureteroscopy was performed of the distal fourth- fifth left ureter alongside a separate sensor working wire with the feeding tube in urinary bladder for pressure release.  No mucosal abnormalities were seen of the distal ureter, mid ureter whatsoever. Next, the semi-rigid ureteroscope was exchanged for the 6-French non- digital flexible ureteroscope over the sensor working wire to the level of the UPJ.  Flexible ureteroscopy was then performed in this location. Immediately, it became apparent that there was a protrusion of the large renal pelvis stone into the area of the UPJ.  Thus, causing apparent filling defect by imaging.  There was no evidence of intraluminal mass or stricture whatsoever.  I felt that orientation of the stone likely cause this appearance and therefore, felt that likely pyelolithotomy with removal of this this large remnant stone would in fact be likely most successful and pyeloplasty reimplant in this location would likely not be warranted.  Given the ureteroscopy on the left side, it was felt that temporizing stent was warranted.  As such, the ureteroscope was withdrawn and inspection of the ureter one more time revealed no evidence of mucosal abnormality.  Finally, a new 6 x 26 contour stent was placed using cystoscopic and fluoroscopic guidance.  Good proximal and distal curl were noted.  Efflux of urine was seen around and through the distal end of the stent.  The bladder was emptied per cystoscope. The tether was fashioned in the dorsum of penis.  Procedure was then terminated.  The patient tolerated the procedure well.  There were no immediate periprocedural complications.  The patient was taken to postanesthesia care unit in stable condition.          ______________________________ Alexis Frock, MD     TM/MEDQ  D:  09/30/2013  T:  10/01/2013  Job:  [id number]

## 2013-10-13 ENCOUNTER — Encounter: Payer: Self-pay | Admitting: Nurse Practitioner

## 2013-10-13 ENCOUNTER — Ambulatory Visit (INDEPENDENT_AMBULATORY_CARE_PROVIDER_SITE_OTHER): Payer: BC Managed Care – PPO | Admitting: Nurse Practitioner

## 2013-10-13 VITALS — BP 118/72 | HR 72 | Temp 97.7°F | Ht 73.0 in | Wt 156.4 lb

## 2013-10-13 DIAGNOSIS — E119 Type 2 diabetes mellitus without complications: Secondary | ICD-10-CM

## 2013-10-13 DIAGNOSIS — I10 Essential (primary) hypertension: Secondary | ICD-10-CM

## 2013-10-13 LAB — POCT GLYCOSYLATED HEMOGLOBIN (HGB A1C): Hemoglobin A1C: 9

## 2013-10-13 MED ORDER — LINAGLIPTIN-METFORMIN HCL 2.5-1000 MG PO TABS: 1.0000 | ORAL_TABLET | Freq: Two times a day (BID) | ORAL | Status: DC

## 2013-10-13 NOTE — Patient Instructions (Signed)

## 2013-10-13 NOTE — Progress Notes (Signed)
   Subjective:    Patient ID: Philip Richardson, male    DOB: 30-Jan-1956, 58 y.o.   MRN: [id number]  HPI Patient in several weeks ago and saw B. [place]- was found to have diabetes- He was originally put on insulin but was changed to oral meds at last visit- Fasting blood sugars are running 120-150 in AM- He is doing much better and trying to watch diet.    Review of Systems  Constitutional: Negative.   HENT: Negative.   Respiratory: Negative.   Cardiovascular: Negative.   Genitourinary: Negative.   Neurological: Negative.   Hematological: Negative.   Psychiatric/Behavioral: Negative.   All other systems reviewed and are negative.      Objective:   Physical Exam  Constitutional: He is oriented to person, place, and time. He appears well-developed and well-nourished.  Cardiovascular: Normal rate, regular rhythm and normal heart sounds.   Pulmonary/Chest: Effort normal and breath sounds normal.  Abdominal: Soft. Bowel sounds are normal.  Neurological: He is alert and oriented to person, place, and time.  Skin: Skin is warm and dry.  Psychiatric: He has a normal mood and affect. His behavior is normal. Judgment and thought content normal.    BP 118/72  Pulse 72  Temp(Src) 97.7 F (36.5 C) (Oral)  Ht 6\' 1"  (1.854 m)  Wt 156 lb 6.4 oz (70.943 kg)  BMI 20.64 kg/m2  Results for orders placed in visit on 10/13/13  POCT GLYCOSYLATED HEMOGLOBIN (HGB A1C)      Result Value Ref Range   Hemoglobin A1C 9.0%          Assessment & Plan:   1. Diabetes   2. Hypertension    Orders Placed This Encounter  Procedures  . POCT glycosylated hemoglobin (Hb A1C)   Meds ordered this encounter  Medications  . Insulin Pen Needle (PEN NEEDLES) 31G X 6 MM MISC    Sig:   . ONETOUCH DELICA LANCETS [id number] MISC    Sig:   . Linagliptin-Metformin HCl 2.10-998 MG TABS    Sig: Take 1 tablet by mouth 2 (two) times daily with a meal.    Dispense:  60 tablet    Refill:  3    Order Specific Question:   Supervising Provider    Answer:  Joycelyn Man   Continue toi keep diary of blood sugars Labs pending Health maintenance reviewed Diet and exercise encouraged Continue all meds Follow up  In 3 months   [place], FNP

## 2013-10-14 ENCOUNTER — Other Ambulatory Visit: Payer: Self-pay | Admitting: Urology

## 2013-11-24 ENCOUNTER — Encounter (HOSPITAL_COMMUNITY): Payer: Self-pay | Admitting: Pharmacy Technician

## 2013-12-01 ENCOUNTER — Encounter (HOSPITAL_COMMUNITY)
Admission: RE | Admit: 2013-12-01 | Discharge: 2013-12-01 | Disposition: A | Discharge status: Home / Self Care | Payer: BC Managed Care – PPO | Source: Ambulatory Visit | Attending: Urology | Admitting: Urology

## 2013-12-01 ENCOUNTER — Ambulatory Visit (HOSPITAL_COMMUNITY)
Admission: RE | Admit: 2013-12-01 | Discharge: 2013-12-01 | Disposition: A | Discharge status: Home / Self Care | Payer: BC Managed Care – PPO | Source: Ambulatory Visit | Attending: Anesthesiology | Admitting: Anesthesiology

## 2013-12-01 ENCOUNTER — Encounter (HOSPITAL_COMMUNITY): Payer: Self-pay

## 2013-12-01 ENCOUNTER — Encounter (INDEPENDENT_AMBULATORY_CARE_PROVIDER_SITE_OTHER): Payer: Self-pay

## 2013-12-01 DIAGNOSIS — Z01818 Encounter for other preprocedural examination: Secondary | ICD-10-CM | POA: Insufficient documentation

## 2013-12-01 DIAGNOSIS — Z01812 Encounter for preprocedural laboratory examination: Secondary | ICD-10-CM | POA: Insufficient documentation

## 2013-12-01 LAB — CBC
HEMATOCRIT: 35.4 % — AB (ref 39.0–52.0)
HEMOGLOBIN: 12.7 g/dL — AB (ref 13.0–17.0)
MCH: 29.7 pg (ref 26.0–34.0)
MCHC: 35.9 g/dL (ref 30.0–36.0)
MCV: 82.7 fL (ref 78.0–100.0)
Platelets: 198 10*3/uL (ref 150–400)
RBC: 4.28 MIL/uL (ref 4.22–5.81)
RDW: 13.7 % (ref 11.5–15.5)
WBC: 4.8 10*3/uL (ref 4.0–10.5)

## 2013-12-01 LAB — BASIC METABOLIC PANEL
BUN: 20 mg/dL (ref 6–23)
CHLORIDE: 96 meq/L (ref 96–112)
CO2: 31 mEq/L (ref 19–32)
Calcium: 10.1 mg/dL (ref 8.4–10.5)
Creatinine, Ser: 1.05 mg/dL (ref 0.50–1.35)
GFR calc non Af Amer: 77 mL/min — ABNORMAL LOW (ref >90)
GFR, EST AFRICAN AMERICAN: 89 mL/min — AB (ref >90)
Glucose, Bld: 128 mg/dL — ABNORMAL HIGH (ref 70–99)
POTASSIUM: 5.7 meq/L — AB (ref 3.7–5.3)
Sodium: 135 mEq/L — ABNORMAL LOW (ref 137–147)

## 2013-12-01 NOTE — Progress Notes (Signed)
ekg 4/15 epic

## 2013-12-01 NOTE — Progress Notes (Signed)
Left voice mail message with Foster Simpson at [facility] requesting her to be sure Dr Tresa Moore is aware of potassium results at 1415

## 2013-12-01 NOTE — Patient Instructions (Signed)
Your procedure is scheduled on: 12/09/13  Wednesday    Report to [facility] [place]      AM.   Call this number if you have problems the morning of surgery: [phone]        Do not eat food  Or drink :After Midnight. Tuesday NIGHT   Take these medicines the morning of surgery with A SIP OF WATER:NONE DO NOT TAKE ANY DIABETIC MEDICATION MORNING OF SURGERY   .  Contacts, dentures or partial plates, or metal hairpins  can not be worn to surgery. Your family will be responsible for glasses, dentures, hearing aides while you are in surgery  Leave suitcase in the car. After surgery it may be brought to your room.  For patients admitted to the hospital, checkout time is 11:00 AM day of  discharge.         [facility] IS NOT RESPONSIBLE FOR ANY VALUABLES                                                                  [facility] - Preparing for Surgery Before surgery, you can play an important role.  Because skin is not sterile, your skin needs to be as free of germs as possible.  You can reduce the number of germs on your skin by washing with CHG (chlorahexidine gluconate) soap before surgery.  CHG is an antiseptic cleaner which kills germs and bonds with the skin to continue killing germs even after washing. Please DO NOT use if you have an allergy to CHG or antibacterial soaps.  If your skin becomes reddened/irritated stop using the CHG and inform your nurse when you arrive at Short Stay. Do not shave (including legs and underarms) for at least 48 hours prior to the first CHG shower.  You may shave your face/neck. Please follow these instructions carefully:  1.  Shower with CHG Soap the night before surgery and the  morning of Surgery.  2.  If you choose to wash your hair, wash your hair first as usual with your  normal  shampoo.  3.  After you shampoo, rinse your hair and body thoroughly to remove the   shampoo.                           4.  Use CHG as you would any other liquid soap.  You can apply chg directly  to the skin and wash                       Gently with a scrungie or clean washcloth.  5.  Apply the CHG Soap to your body ONLY FROM THE NECK DOWN.   Do not use on face/ open                           Wound or open sores. Avoid contact with eyes, ears mouth and genitals (private parts).                       Wash face,  Development worker, international aid (private  parts) with your normal soap.             6.  Wash thoroughly, paying special attention to the area where your surgery  will be performed.  7.  Thoroughly rinse your body with warm water from the neck down.  8.  DO NOT shower/wash with your normal soap after using and rinsing off  the CHG Soap.                9.  Pat yourself dry with a clean towel.            10.  Wear clean pajamas.            11.  Place clean sheets on your bed the night of your first shower and do not  sleep with pets. Day of Surgery : Do not apply any lotions/deodorants the morning of surgery.  Please wear clean clothes to the hospital/surgery center.  FAILURE TO FOLLOW THESE INSTRUCTIONS MAY RESULT IN THE CANCELLATION OF YOUR SURGERY PATIENT SIGNATURE_________________________________  NURSE SIGNATURE__________________________________  ________________________________________________________________________

## 2013-12-01 NOTE — Progress Notes (Signed)
Faxed BMET results to Dr Tresa Moore via EPIC to fax and inbox

## 2013-12-09 ENCOUNTER — Inpatient Hospital Stay (HOSPITAL_COMMUNITY): Payer: BC Managed Care – PPO

## 2013-12-09 ENCOUNTER — Encounter (HOSPITAL_COMMUNITY): Payer: Self-pay | Admitting: *Deleted

## 2013-12-09 ENCOUNTER — Encounter (HOSPITAL_COMMUNITY): Payer: BC Managed Care – PPO | Admitting: Registered Nurse

## 2013-12-09 ENCOUNTER — Inpatient Hospital Stay (HOSPITAL_COMMUNITY): Payer: BC Managed Care – PPO | Admitting: Registered Nurse

## 2013-12-09 ENCOUNTER — Encounter (HOSPITAL_COMMUNITY)
Admission: RE | Disposition: A | Discharge status: Home / Self Care | Payer: Self-pay | Source: Ambulatory Visit | Attending: Urology

## 2013-12-09 ENCOUNTER — Inpatient Hospital Stay (HOSPITAL_COMMUNITY)
Admission: RE | Admit: 2013-12-09 | Discharge: 2013-12-11 | DRG: 660 | Disposition: A | Discharge status: Home / Self Care | Payer: BC Managed Care – PPO | Source: Ambulatory Visit | Attending: Urology | Admitting: Urology

## 2013-12-09 DIAGNOSIS — N200 Calculus of kidney: Principal | ICD-10-CM | POA: Diagnosis present

## 2013-12-09 DIAGNOSIS — Q638 Other specified congenital malformations of kidney: Secondary | ICD-10-CM

## 2013-12-09 DIAGNOSIS — K219 Gastro-esophageal reflux disease without esophagitis: Secondary | ICD-10-CM | POA: Diagnosis present

## 2013-12-09 DIAGNOSIS — IMO0002 Reserved for concepts with insufficient information to code with codable children: Secondary | ICD-10-CM

## 2013-12-09 DIAGNOSIS — Z794 Long term (current) use of insulin: Secondary | ICD-10-CM

## 2013-12-09 DIAGNOSIS — Z87891 Personal history of nicotine dependence: Secondary | ICD-10-CM

## 2013-12-09 DIAGNOSIS — E119 Type 2 diabetes mellitus without complications: Secondary | ICD-10-CM | POA: Diagnosis present

## 2013-12-09 DIAGNOSIS — I10 Essential (primary) hypertension: Secondary | ICD-10-CM | POA: Diagnosis present

## 2013-12-09 DIAGNOSIS — R339 Retention of urine, unspecified: Secondary | ICD-10-CM | POA: Diagnosis present

## 2013-12-09 DIAGNOSIS — N1330 Unspecified hydronephrosis: Secondary | ICD-10-CM | POA: Diagnosis present

## 2013-12-09 DIAGNOSIS — Z87442 Personal history of urinary calculi: Secondary | ICD-10-CM

## 2013-12-09 HISTORY — PX: ROBOT ASSISTED PYELOPLASTY: SHX5143

## 2013-12-09 HISTORY — PX: CYSTOSCOPY W/ URETERAL STENT PLACEMENT: SHX1429

## 2013-12-09 LAB — GLUCOSE, CAPILLARY
Glucose-Capillary: 140 mg/dL — ABNORMAL HIGH (ref 70–99)
Glucose-Capillary: 223 mg/dL — ABNORMAL HIGH (ref 70–99)
Glucose-Capillary: 247 mg/dL — ABNORMAL HIGH (ref 70–99)
Glucose-Capillary: 127 mg/dL — ABNORMAL HIGH (ref 70–99)

## 2013-12-09 LAB — HEMOGLOBIN AND HEMATOCRIT, BLOOD
HEMATOCRIT: 31.6 % — AB (ref 39.0–52.0)
Hemoglobin: 11.2 g/dL — ABNORMAL LOW (ref 13.0–17.0)

## 2013-12-09 LAB — TYPE AND SCREEN
ABO/RH(D): A POS
Antibody Screen: NEGATIVE

## 2013-12-09 LAB — ABO/RH: ABO/RH(D): A POS

## 2013-12-09 SURGERY — CYSTOSCOPY, WITH RETROGRADE PYELOGRAM AND URETERAL STENT INSERTION
Anesthesia: General

## 2013-12-09 MED ORDER — SODIUM CHLORIDE 0.9 % IV SOLN
INTRAVENOUS | Status: DC
  Administered 2013-12-09 – 2013-12-11 (×4): via INTRAVENOUS

## 2013-12-09 MED ORDER — LACTATED RINGERS IV SOLN: INTRAVENOUS | Status: DC

## 2013-12-09 MED ORDER — MIDAZOLAM HCL 5 MG/5ML IJ SOLN
INTRAMUSCULAR | Status: DC | PRN
Start: 2013-12-09 — End: 2013-12-09
  Administered 2013-12-09: 2 mg via INTRAVENOUS

## 2013-12-09 MED ORDER — LIDOCAINE HCL (CARDIAC) 20 MG/ML IV SOLN
INTRAVENOUS | Status: AC
  Filled 2013-12-09: qty 5

## 2013-12-09 MED ORDER — HYDROMORPHONE HCL PF 1 MG/ML IJ SOLN
INTRAMUSCULAR | Status: DC | PRN
  Administered 2013-12-09 (×2): .2 mg via INTRAVENOUS

## 2013-12-09 MED ORDER — IOHEXOL 300 MG/ML  SOLN
INTRAMUSCULAR | Status: DC | PRN
  Administered 2013-12-09: 50 mL

## 2013-12-09 MED ORDER — SODIUM CHLORIDE 0.9 % IJ SOLN
INTRAMUSCULAR | Status: AC
  Filled 2013-12-09: qty 20

## 2013-12-09 MED ORDER — DIPHENHYDRAMINE HCL 25 MG PO CAPS: 25.0000 mg | ORAL_CAPSULE | Freq: Four times a day (QID) | ORAL | Status: DC | PRN

## 2013-12-09 MED ORDER — CLINDAMYCIN PHOSPHATE 900 MG/50ML IV SOLN
INTRAVENOUS | Status: AC
  Filled 2013-12-09: qty 50

## 2013-12-09 MED ORDER — ONDANSETRON HCL 4 MG/2ML IJ SOLN
INTRAMUSCULAR | Status: DC | PRN
  Administered 2013-12-09: 4 mg via INTRAVENOUS

## 2013-12-09 MED ORDER — ROCURONIUM BROMIDE 100 MG/10ML IV SOLN
INTRAVENOUS | Status: AC
  Filled 2013-12-09: qty 1

## 2013-12-09 MED ORDER — WATER FOR IRRIGATION, STERILE IR SOLN
Status: DC | PRN
  Administered 2013-12-09: 3000 mL

## 2013-12-09 MED ORDER — BUPIVACAINE LIPOSOME 1.3 % IJ SUSP
20.0000 mL | Freq: Once | INTRAMUSCULAR | Status: AC
  Administered 2013-12-09: 20 mL
  Filled 2013-12-09: qty 20

## 2013-12-09 MED ORDER — MIDAZOLAM HCL 2 MG/2ML IJ SOLN
INTRAMUSCULAR | Status: AC
  Filled 2013-12-09: qty 2

## 2013-12-09 MED ORDER — PROPOFOL 10 MG/ML IV BOLUS
INTRAVENOUS | Status: DC | PRN
  Administered 2013-12-09: 160 mg via INTRAVENOUS

## 2013-12-09 MED ORDER — GENTAMICIN SULFATE 40 MG/ML IJ SOLN
5.0000 mg/kg | INTRAVENOUS | Status: AC
  Administered 2013-12-09: 360 mg via INTRAVENOUS
  Filled 2013-12-09: qty 9

## 2013-12-09 MED ORDER — GLYCOPYRROLATE 0.2 MG/ML IJ SOLN
INTRAMUSCULAR | Status: DC | PRN
  Administered 2013-12-09: .6 mg via INTRAVENOUS

## 2013-12-09 MED ORDER — INSULIN ASPART 100 UNIT/ML ~~LOC~~ SOLN
3.0000 [IU] | Freq: Three times a day (TID) | SUBCUTANEOUS | Status: DC
  Administered 2013-12-09 – 2013-12-10 (×3): 3 [IU] via SUBCUTANEOUS

## 2013-12-09 MED ORDER — ATROPINE SULFATE 0.4 MG/ML IJ SOLN
INTRAMUSCULAR | Status: AC
  Filled 2013-12-09: qty 2

## 2013-12-09 MED ORDER — HYDROMORPHONE HCL PF 1 MG/ML IJ SOLN
0.5000 mg | INTRAMUSCULAR | Status: DC | PRN
  Administered 2013-12-09 (×2): 1 mg via INTRAVENOUS
  Filled 2013-12-09 (×2): qty 1

## 2013-12-09 MED ORDER — EPHEDRINE SULFATE 50 MG/ML IJ SOLN
INTRAMUSCULAR | Status: AC
  Filled 2013-12-09: qty 1

## 2013-12-09 MED ORDER — LACTATED RINGERS IV SOLN
INTRAVENOUS | Status: DC | PRN
  Administered 2013-12-09: 08:00:00 via INTRAVENOUS

## 2013-12-09 MED ORDER — HYDROCODONE-ACETAMINOPHEN 5-325 MG PO TABS: 1.0000 | ORAL_TABLET | Freq: Four times a day (QID) | ORAL | Status: DC | PRN

## 2013-12-09 MED ORDER — ONDANSETRON HCL 4 MG/2ML IJ SOLN: 4.0000 mg | INTRAMUSCULAR | Status: DC | PRN

## 2013-12-09 MED ORDER — DOCUSATE SODIUM 100 MG PO CAPS
100.0000 mg | ORAL_CAPSULE | Freq: Two times a day (BID) | ORAL | Status: DC
  Administered 2013-12-09 – 2013-12-10 (×3): 100 mg via ORAL
  Filled 2013-12-09 (×5): qty 1

## 2013-12-09 MED ORDER — HYDROMORPHONE HCL PF 1 MG/ML IJ SOLN
INTRAMUSCULAR | Status: AC
  Filled 2013-12-09: qty 1

## 2013-12-09 MED ORDER — ROCURONIUM BROMIDE 100 MG/10ML IV SOLN
INTRAVENOUS | Status: DC | PRN
  Administered 2013-12-09: 70 mg via INTRAVENOUS
  Administered 2013-12-09: 10 mg via INTRAVENOUS
  Administered 2013-12-09: 5 mg via INTRAVENOUS

## 2013-12-09 MED ORDER — CLINDAMYCIN PHOSPHATE 900 MG/50ML IV SOLN
900.0000 mg | Freq: Once | INTRAVENOUS | Status: AC
  Administered 2013-12-09: 900 mg via INTRAVENOUS

## 2013-12-09 MED ORDER — PROPOFOL 10 MG/ML IV BOLUS
INTRAVENOUS | Status: AC
  Filled 2013-12-09: qty 20

## 2013-12-09 MED ORDER — ACETAMINOPHEN 500 MG PO TABS
1000.0000 mg | ORAL_TABLET | Freq: Three times a day (TID) | ORAL | Status: AC
  Administered 2013-12-09 – 2013-12-10 (×3): 1000 mg via ORAL
  Filled 2013-12-09 (×3): qty 2

## 2013-12-09 MED ORDER — HYDROMORPHONE HCL PF 2 MG/ML IJ SOLN
INTRAMUSCULAR | Status: AC
  Filled 2013-12-09: qty 1

## 2013-12-09 MED ORDER — FENTANYL CITRATE 0.05 MG/ML IJ SOLN
INTRAMUSCULAR | Status: AC
  Filled 2013-12-09: qty 5

## 2013-12-09 MED ORDER — FENTANYL CITRATE 0.05 MG/ML IJ SOLN
INTRAMUSCULAR | Status: DC | PRN
  Administered 2013-12-09 (×5): 50 ug via INTRAVENOUS

## 2013-12-09 MED ORDER — GLYCOPYRROLATE 0.2 MG/ML IJ SOLN
INTRAMUSCULAR | Status: AC
  Filled 2013-12-09: qty 3

## 2013-12-09 MED ORDER — ONDANSETRON HCL 4 MG/2ML IJ SOLN
INTRAMUSCULAR | Status: AC
  Filled 2013-12-09: qty 2

## 2013-12-09 MED ORDER — EPHEDRINE SULFATE 50 MG/ML IJ SOLN
INTRAMUSCULAR | Status: DC | PRN
Start: 2013-12-09 — End: 2013-12-09
  Administered 2013-12-09 (×3): 5 mg via INTRAVENOUS

## 2013-12-09 MED ORDER — DIPHENHYDRAMINE HCL 25 MG PO CAPS
25.0000 mg | ORAL_CAPSULE | Freq: Once | ORAL | Status: AC
  Administered 2013-12-09: 25 mg via ORAL
  Filled 2013-12-09: qty 1

## 2013-12-09 MED ORDER — HYDROMORPHONE HCL PF 1 MG/ML IJ SOLN
0.2500 mg | INTRAMUSCULAR | Status: DC | PRN
  Administered 2013-12-09 (×2): 0.5 mg via INTRAVENOUS

## 2013-12-09 MED ORDER — LACTATED RINGERS IR SOLN
Status: DC | PRN
  Administered 2013-12-09: 1000 mL

## 2013-12-09 MED ORDER — NEOSTIGMINE METHYLSULFATE 10 MG/10ML IV SOLN
INTRAVENOUS | Status: DC | PRN
  Administered 2013-12-09: 4 mg via INTRAVENOUS

## 2013-12-09 MED ORDER — PHENYLEPHRINE HCL 10 MG/ML IJ SOLN
INTRAMUSCULAR | Status: AC
  Filled 2013-12-09: qty 2

## 2013-12-09 MED ORDER — PHENYLEPHRINE HCL 10 MG/ML IJ SOLN
20.0000 mg | INTRAVENOUS | Status: DC | PRN
  Administered 2013-12-09: 10 ug/min via INTRAVENOUS

## 2013-12-09 MED ORDER — SENNA 8.6 MG PO TABS
1.0000 | ORAL_TABLET | Freq: Two times a day (BID) | ORAL | Status: DC
  Administered 2013-12-09 – 2013-12-10 (×2): 8.6 mg via ORAL
  Filled 2013-12-09 (×2): qty 1

## 2013-12-09 MED ORDER — SODIUM CHLORIDE 0.9 % IJ SOLN
INTRAMUSCULAR | Status: AC
  Filled 2013-12-09: qty 10

## 2013-12-09 MED ORDER — LIDOCAINE HCL (CARDIAC) 20 MG/ML IV SOLN
INTRAVENOUS | Status: DC | PRN
  Administered 2013-12-09: 80 mg via INTRAVENOUS

## 2013-12-09 MED ORDER — OXYCODONE HCL 5 MG PO TABS
5.0000 mg | ORAL_TABLET | ORAL | Status: DC | PRN
  Administered 2013-12-09 – 2013-12-10 (×2): 5 mg via ORAL
  Filled 2013-12-09 (×2): qty 1

## 2013-12-09 MED ORDER — INSULIN ASPART 100 UNIT/ML ~~LOC~~ SOLN
0.0000 [IU] | Freq: Three times a day (TID) | SUBCUTANEOUS | Status: DC
  Administered 2013-12-09: 5 [IU] via SUBCUTANEOUS
  Administered 2013-12-10: 3 [IU] via SUBCUTANEOUS
  Administered 2013-12-10: 2 [IU] via SUBCUTANEOUS

## 2013-12-09 MED ORDER — PNEUMOCOCCAL VAC POLYVALENT 25 MCG/0.5ML IJ INJ
0.5000 mL | INJECTION | INTRAMUSCULAR | Status: AC
  Administered 2013-12-10: 0.5 mL via INTRAMUSCULAR
  Filled 2013-12-09 (×2): qty 0.5

## 2013-12-09 MED ORDER — SODIUM CHLORIDE 0.9 % IV SOLN
INTRAVENOUS | Status: DC | PRN
  Administered 2013-12-09: 11:00:00 via INTRAVENOUS

## 2013-12-09 SURGICAL SUPPLY — 86 items
BAG URO CATCHER STRL LF (DRAPE) ×3 IMPLANT
CANISTER OMNI JUG 16 LITER (MISCELLANEOUS) IMPLANT
CANISTER SUCTION 2500CC (MISCELLANEOUS) IMPLANT
CATH INTERMIT  6FR 70CM (CATHETERS) ×3 IMPLANT
CHLORAPREP W/TINT 26ML (MISCELLANEOUS) ×6 IMPLANT
CLIP LIGATING HEM O LOK PURPLE (MISCELLANEOUS) ×3 IMPLANT
CLIP LIGATING HEMO O LOK GREEN (MISCELLANEOUS) ×3 IMPLANT
CONT SPEC 4OZ CLIKSEAL STRL BL (MISCELLANEOUS) ×3 IMPLANT
CORD HIGH FREQUENCY UNIPOLAR (ELECTROSURGICAL) ×3 IMPLANT
CORDS BIPOLAR (ELECTRODE) ×6 IMPLANT
COVER SURGICAL LIGHT HANDLE (MISCELLANEOUS) ×6 IMPLANT
COVER TIP SHEARS 8 DVNC (MISCELLANEOUS) ×2 IMPLANT
COVER TIP SHEARS 8MM DA VINCI (MISCELLANEOUS) ×1
DECANTER SPIKE VIAL GLASS SM (MISCELLANEOUS) IMPLANT
DERMABOND ADVANCED (GAUZE/BANDAGES/DRESSINGS) ×2
DERMABOND ADVANCED .7 DNX12 (GAUZE/BANDAGES/DRESSINGS) ×4 IMPLANT
DRAIN CHANNEL 15F RND FF 3/16 (WOUND CARE) ×3 IMPLANT
DRAPE INCISE IOBAN 66X45 STRL (DRAPES) ×6 IMPLANT
DRAPE LAPAROSCOPIC ABDOMINAL (DRAPES) ×6 IMPLANT
DRAPE LG THREE QUARTER DISP (DRAPES) ×6 IMPLANT
DRAPE TABLE BACK 44X90 PK DISP (DRAPES) ×6 IMPLANT
DRAPE UTILITY XL STRL (DRAPES) ×3 IMPLANT
DRAPE WARM FLUID 44X44 (DRAPE) ×3 IMPLANT
DRSG TEGADERM 6X8 (GAUZE/BANDAGES/DRESSINGS) ×3 IMPLANT
ELECT REM PT RETURN 9FT ADLT (ELECTROSURGICAL) ×3
ELECTRODE REM PT RTRN 9FT ADLT (ELECTROSURGICAL) ×2 IMPLANT
EVACUATOR SILICONE 100CC (DRAIN) ×3 IMPLANT
GLOVE BIOGEL M STRL SZ7.5 (GLOVE) ×15 IMPLANT
GLOVE BIOGEL PI IND STRL 6.5 (GLOVE) ×2 IMPLANT
GLOVE BIOGEL PI IND STRL 7.0 (GLOVE) ×6 IMPLANT
GLOVE BIOGEL PI IND STRL 7.5 (GLOVE) ×6 IMPLANT
GLOVE BIOGEL PI IND STRL 8 (GLOVE) ×2 IMPLANT
GLOVE BIOGEL PI INDICATOR 6.5 (GLOVE) ×1
GLOVE BIOGEL PI INDICATOR 7.0 (GLOVE) ×3
GLOVE BIOGEL PI INDICATOR 7.5 (GLOVE) ×3
GLOVE BIOGEL PI INDICATOR 8 (GLOVE) ×1
GLOVE SS BIOGEL STRL SZ 7 (GLOVE) ×6 IMPLANT
GLOVE SUPERSENSE BIOGEL SZ 7 (GLOVE) ×3
GLOVE SURG SS PI 6.5 STRL IVOR (GLOVE) ×3 IMPLANT
GLOVE SURG SS PI 7.0 STRL IVOR (GLOVE) ×9 IMPLANT
GOWN STRL REUS W/TWL LRG LVL3 (GOWN DISPOSABLE) ×33 IMPLANT
GUIDEWIRE ANG ZIPWIRE 038X150 (WIRE) ×3 IMPLANT
GUIDEWIRE STR DUAL SENSOR (WIRE) ×3 IMPLANT
KIT ACCESSORY DA VINCI DISP (KITS) ×1
KIT ACCESSORY DVNC DISP (KITS) ×2 IMPLANT
KIT BASIN OR (CUSTOM PROCEDURE TRAY) ×3 IMPLANT
LOOP VESSEL MAXI BLUE (MISCELLANEOUS) ×3 IMPLANT
LUBRICANT JELLY ST 5GR 8946 (MISCELLANEOUS) ×3 IMPLANT
NEEDLE INSUFFLATION 14GA 120MM (NEEDLE) ×3 IMPLANT
NS IRRIG 1000ML POUR BTL (IV SOLUTION) IMPLANT
PACK CYSTO (CUSTOM PROCEDURE TRAY) ×3 IMPLANT
PENCIL BUTTON HOLSTER BLD 10FT (ELECTRODE) ×6 IMPLANT
POSITIONER SURGICAL ARM (MISCELLANEOUS) IMPLANT
POUCH SPECIMEN RETRIEVAL 10MM (ENDOMECHANICALS) ×6 IMPLANT
SET TUBE IRRIG SUCTION NO TIP (IRRIGATION / IRRIGATOR) ×6 IMPLANT
SOLUTION ANTI FOG 6CC (MISCELLANEOUS) ×3 IMPLANT
SOLUTION ELECTROLUBE (MISCELLANEOUS) ×3 IMPLANT
SPONGE LAP 18X18 X RAY DECT (DISPOSABLE) IMPLANT
SPONGE LAP 4X18 X RAY DECT (DISPOSABLE) IMPLANT
STAPLER VISISTAT 35W (STAPLE) IMPLANT
STENT CONTOUR 6FRX26X.038 (STENTS) ×6 IMPLANT
SUT ETHILON 3 0 PS 1 (SUTURE) ×3 IMPLANT
SUT MNCRL 3 0 VIOLET RB1 (SUTURE) IMPLANT
SUT MNCRL AB 4-0 PS2 18 (SUTURE) ×12 IMPLANT
SUT MONOCRYL 3 0 RB1 (SUTURE)
SUT PDS AB 0 CT1 36 (SUTURE) ×6 IMPLANT
SUT VIC AB 0 CT1 27 (SUTURE)
SUT VIC AB 0 CT1 27XBRD ANTBC (SUTURE) IMPLANT
SUT VIC AB 0 UR5 27 (SUTURE) IMPLANT
SUT VIC AB 2-0 SH 27 (SUTURE) ×1
SUT VIC AB 2-0 SH 27X BRD (SUTURE) ×2 IMPLANT
SUT VIC AB 3-0 SH 27 (SUTURE) ×1
SUT VIC AB 3-0 SH 27X BRD (SUTURE) ×2 IMPLANT
SUT VICRYL 0 UR6 27IN ABS (SUTURE) ×3 IMPLANT
SUT VLOC BARB 180 ABS3/0GR12 (SUTURE) ×9
SUTURE VLOC BRB 180 ABS3/0GR12 (SUTURE) ×6 IMPLANT
TOWEL OR NON WOVEN STRL DISP B (DISPOSABLE) ×3 IMPLANT
TRAY FOLEY CATH 14FRSI W/METER (CATHETERS) IMPLANT
TRAY FOLEY CATH 16FRSI W/METER (SET/KITS/TRAYS/PACK) ×3 IMPLANT
TRAY LAP CHOLE (CUSTOM PROCEDURE TRAY) ×3 IMPLANT
TROCAR BLADELESS OPT 12M 150M (ENDOMECHANICALS) ×3 IMPLANT
TROCAR BLADELESS OPT 5 100 (ENDOMECHANICALS) ×3 IMPLANT
TROCAR XCEL 12X100 BLDLESS (ENDOMECHANICALS) ×3 IMPLANT
TUBING CONNECTING 10 (TUBING) ×3 IMPLANT
TUBING INSUFFLATION 10FT LAP (TUBING) ×6 IMPLANT
WATER STERILE IRR 1500ML POUR (IV SOLUTION) IMPLANT

## 2013-12-09 NOTE — Transfer of Care (Signed)
Immediate Anesthesia Transfer of Care Note  Patient: Philip Richardson  Procedure(s) Performed: Procedure(s): CYSTOSCOPY WITH RETROGRADE PYELOGRAM/URETERAL STENT PLACEMENT (Bilateral) ROBOTIC ASSISTED BILATERAL PYELOLITHOTOMY, RIGHT  PYELOPLASTY  (N/A)  Patient Location: PACU  Anesthesia Type:General  Level of Consciousness: awake, alert , oriented and patient cooperative  Airway & Oxygen Therapy: Patient Spontanous Breathing and Patient connected to face mask oxygen  Post-op Assessment: Report given to PACU RN, Post -op Vital signs reviewed and stable and Patient moving all extremities  Post vital signs: Reviewed and stable  Complications: No apparent anesthesia complications

## 2013-12-09 NOTE — H&P (Signed)
Philip Richardson is an 58 y.o. male.    Chief Complaint: Pre-Op Cysto, Bilateral Stents, Bilateral Robotic Pyelolithotomy  HPI:    1 - Bilateral Large Nephrolithiasis in Horseshoe Kidney -  Pre 2015 - Rt PCNL 2005 at [facility] 09/2013 - Rt 2.6cm, renal pelvis stone, Left 1.7cm renal pelvis stone with hydro. [place] >1500HU by CT on eval for hematuria.  2 - Horshoe Kidney - Complete horsehoe kidney, not-pelvic location. Complex renovascular anatomy with Rt 1 upper early branch artery , Rt 1 lower posterior artery, Rt upper and lower veins, Lt 1 upper artery, Lt 1 lower posterior artery, Lt 1 small vein upper, Lt 1 large mid retroaortic vein. High UPJ insertion bilaterally.  3 - Left Hydronephrosis- Mod lefft hydro by CT 2014 w/o obvious stone at UPJ / ureter raising quesion of ureteral stricture / mass/ UPJO, diagnostic ureteroscopy 09/2013 confirmed no stricture, just stone protruding into UPJ area.  PMH sig for HTN, DM2. No CV disease. No strong blood thinners.  Today Henning is seen to proceed with bilateral robotic pyelolithotomy for his complex bilateral stone disease in congenitially abnormal kidney. Most recent UCX negative and UA without infectious parameters.   Past Medical History  Diagnosis Date  . Type 2 diabetes mellitus   . Hypertension   . GERD (gastroesophageal reflux disease)   . Renal calculus, bilateral   . Horseshoe kidney     BILATERAL  . Frequency of urination   . Urgency of urination   . Wears dentures     Past Surgical History  Procedure Laterality Date  . Percutaneous nephrolithotripsy  2005  . Cystoscopy with retrograde pyelogram, ureteroscopy and stent placement Bilateral 09/30/2013    Procedure: CYSTOSCOPY WITH BILATERAL RETROGRADE PYELOGRAM, LEFT DIAGNOSTIC URETEROSCOPY AND Left ureteral stent;  Surgeon: Alexis Frock, MD;  Location: [facility];  Service: Urology;  Laterality: Bilateral;    No family history on file. Social History:   reports that he has never smoked. He has never used smokeless tobacco. He reports that he does not drink alcohol or use illicit drugs.  Allergies: No Known Allergies  Medications Prior to Admission  Medication Sig Dispense Refill  . Cholecalciferol (VITAMIN D3) 2000 UNITS TABS Take 1 capsule by mouth daily.      . Insulin Pen Needle (PEN NEEDLES) 31G X 6 MM MISC       . Linagliptin-Metformin HCl 2.10-998 MG TABS Take 1 tablet by mouth 2 (two) times daily with a meal.  60 tablet  3  . lisinopril (PRINIVIL,ZESTRIL) 10 MG tablet Take 10 mg by mouth every morning.      . Multiple Vitamin (MULTIVITAMIN WITH MINERALS) TABS tablet Take 1 tablet by mouth daily.      . Nutritional Supplements (CHOLESTEROL DEFENSE PO) Take 1 tablet by mouth daily.      Glory Rosebush DELICA LANCETS [id number] MISC         No results found for this or any previous visit (from the past 48 hour(s)). No results found.  Review of Systems  Constitutional: Negative for fever and chills.  HENT: Negative.   Eyes: Negative.   Respiratory: Negative.   Cardiovascular: Negative.   Gastrointestinal: Negative.   Genitourinary: Negative.   Musculoskeletal: Negative.   Skin: Negative.   Neurological: Negative.   Endo/Heme/Allergies: Negative.   Psychiatric/Behavioral: Negative.     Weight 71.215 kg (157 lb). Physical Exam  Constitutional: He is oriented to person, place, and time. He appears well-developed and well-nourished.  HENT:  Head: Normocephalic and atraumatic.  Eyes: EOM are normal. Pupils are equal, round, and reactive to light.  Neck: Normal range of motion. Neck supple.  Cardiovascular: Normal rate and regular rhythm.   Respiratory: Effort normal and breath sounds normal.  GI: Soft. Bowel sounds are normal.  Genitourinary: Penis normal.  No CVAT  Musculoskeletal: Normal range of motion.  Neurological: He is alert and oriented to person, place, and time.  Skin: Skin is warm and dry.  Psychiatric: He has a normal  mood and affect. His behavior is normal. Judgment and thought content normal.     Assessment/Plan  1 - Bilateral Large Nephrolithiasis in Horseshoe Kidney - Very challenging case. I feel percutaneous approach not ideal given very narrow window to kidney and stones in very anterior location. Staged ureteroscopy an option, but may be technically very challenging and would require MANY stages (at least 4). Agree robotic pyelolithotomy likely most definitive and efficient.   We discussed singl v. two stage approach and he wants single stage bilateral robotic pyelolithotomy. Will plan for cysto, bilateral stents, begin surgery with right side (prior PCNL side) and if goes well, proceed with left. Estimate 5 hrs OR time.  We rediscussed pyelolithotomy in detail including indication in cases of unusual anatomy / stone configuration concomitant UPJ abnormalities. We then rediscussed surgical approaches including robotic and open techniques with robotic associated with a shorter convalescence. I showed the patient on their abdomen the approximately 4-6 incision (trocar) sites as well as presumed extraction sites with robotic approach as well as possible open incision sites. We specifically readdressed that there may be need to alter operative plans according to intraopertive findings including conversion to open procedure as well as need for adjunctive procedures such as ureteral stenting to promote correct renal healing. We rediscussed specific peri-operative risks including bleeding, infection, deep vein thrombosis, pulmonary embolism, compartment syndrome, neuropathy / neuropraxia, heart attack, stroke, death, as well as long-term risks such as non-cure / need for additional therapy and need for imaging and lab based post-op surveillance protocols. We rediscussed typical hospital course of approximately 2 day hospitalization, need for peri-operative drains / catheters, and typical post-hospital course with return  to most non-strenuous activities by 2 weeks and ability to return to most jobs and more strenuous activity such as exercise by 6 weeks.   After this lengthy and detail discussion, including answering all of the patient's questions to their satisfaction, they have chosen to proceed with robotic pyelolithotomy today as planned.  2 - Horseshoe Kidney - Rare anatomic varient, likely poor intra-renal drainage driving stone risk substantially. Fortunately good overall renal function.   3 - Left Hydronephrosis / Possible UPJ Obstruction - diagnostic ureteroscopy w/o significant intraluminal obstruction.  Eliany Mccarter 12/09/2013, 6:47 AM

## 2013-12-09 NOTE — Discharge Instructions (Signed)

## 2013-12-09 NOTE — Brief Op Note (Signed)
12/09/2013  2:16 PM  PATIENT:  Philip Richardson  58 y.o. male  PRE-OPERATIVE DIAGNOSIS:  BILATERAL LARGE KIDNEY STONES IN HORSESHOE KIDNEY  POST-OPERATIVE DIAGNOSIS:  BILATERAL LARGE KIDNEY STONES IN HORSESHOE KIDNEY  PROCEDURE:  Procedure(s): CYSTOSCOPY WITH RETROGRADE PYELOGRAM/URETERAL STENT PLACEMENT (Bilateral) ROBOTIC ASSISTED BILATERAL PYELOLITHOTOMY, RIGHT  PYELOPLASTY  (N/A)  SURGEON:  Surgeon(s) and Role:    * Alexis Frock, MD - Primary    * Malka So, MD - Assisting  PHYSICIAN ASSISTANT:   ASSISTANTS: 1 - Irine Seal MD   ANESTHESIA:   general  EBL:  Total I/O In: 2000 [I.V.:2000] Out: 675 [Urine:575; Blood:100]  BLOOD ADMINISTERED:none  DRAINS: 1 - JP to bulb suction, 2- Foley to straight drain   LOCAL MEDICATIONS USED:  MARCAINE     SPECIMEN:  Source of Specimen:  Bilateral Large Renal Stones  DISPOSITION OF SPECIMEN:  [facility] for Compositional Analysis  COUNTS:  YES  TOURNIQUET:  * No tourniquets in log *  DICTATION: .Other Dictation: Dictation Number [id number]  PLAN OF CARE: Admit to inpatient   PATIENT DISPOSITION:  PACU - hemodynamically stable.   Delay start of Pharmacological VTE agent (>24hrs) due to surgical blood loss or risk of bleeding: yes

## 2013-12-09 NOTE — Anesthesia Postprocedure Evaluation (Signed)
  Anesthesia Post-op Note  Patient: Philip Richardson  Procedure(s) Performed: Procedure(s) (LRB): CYSTOSCOPY WITH RETROGRADE PYELOGRAM/URETERAL STENT PLACEMENT (Bilateral) ROBOTIC ASSISTED BILATERAL PYELOLITHOTOMY, RIGHT  PYELOPLASTY  (N/A)  Patient Location: PACU  Anesthesia Type: General  Level of Consciousness: awake and alert   Airway and Oxygen Therapy: Patient Spontanous Breathing  Post-op Pain: mild  Post-op Assessment: Post-op Vital signs reviewed, Patient's Cardiovascular Status Stable, Respiratory Function Stable, Patent Airway and No signs of Nausea or vomiting  Last Vitals:  Filed Vitals:   12/09/13 1604  BP: 130/71  Pulse: 90  Temp: 36.6 C  Resp: 16    Post-op Vital Signs: stable   Complications: No apparent anesthesia complications

## 2013-12-09 NOTE — Anesthesia Preprocedure Evaluation (Addendum)
Anesthesia Evaluation  Patient identified by MRN, date of birth, ID band Patient awake    Reviewed: Allergy & Precautions, H&P , NPO status , Patient's Chart, lab work & pertinent test results  Airway Mallampati: II TM Distance: >3 FB Neck ROM: full    Dental  (+) Edentulous Upper, Edentulous Lower, Dental Advisory Given   Pulmonary neg pulmonary ROS,  breath sounds clear to auscultation  Pulmonary exam normal       Cardiovascular Exercise Tolerance: Good hypertension, Pt. on medications Rhythm:regular Rate:Normal     Neuro/Psych negative neurological ROS  negative psych ROS   GI/Hepatic negative GI ROS, Neg liver ROS,   Endo/Other  diabetes, Well Controlled, Type 2, Insulin Dependent, Oral Hypoglycemic Agents  Renal/GU negative Renal ROS  negative genitourinary   Musculoskeletal   Abdominal   Peds  Hematology negative hematology ROS (+)   Anesthesia Other Findings   Reproductive/Obstetrics negative OB ROS                         Anesthesia Physical Anesthesia Plan  ASA: III  Anesthesia Plan: General   Post-op Pain Management:    Induction: Intravenous  Airway Management Planned: Oral ETT  Additional Equipment:   Intra-op Plan:   Post-operative Plan: Extubation in OR  Informed Consent: I have reviewed the patients History and Physical, chart, labs and discussed the procedure including the risks, benefits and alternatives for the proposed anesthesia with the patient or authorized representative who has indicated his/her understanding and acceptance.   Dental Advisory Given  Plan Discussed with: CRNA and Surgeon  Anesthesia Plan Comments:         Anesthesia Quick Evaluation

## 2013-12-10 ENCOUNTER — Encounter (HOSPITAL_COMMUNITY): Payer: Self-pay | Admitting: Urology

## 2013-12-10 LAB — GLUCOSE, CAPILLARY
GLUCOSE-CAPILLARY: 146 mg/dL — AB (ref 70–99)
GLUCOSE-CAPILLARY: 156 mg/dL — AB (ref 70–99)
GLUCOSE-CAPILLARY: 150 mg/dL — AB (ref 70–99)
Glucose-Capillary: 114 mg/dL — ABNORMAL HIGH (ref 70–99)

## 2013-12-10 LAB — BASIC METABOLIC PANEL
Anion gap: 10 (ref 5–15)
BUN: 13 mg/dL (ref 6–23)
CALCIUM: 8 mg/dL — AB (ref 8.4–10.5)
CHLORIDE: 99 meq/L (ref 96–112)
CO2: 26 meq/L (ref 19–32)
Creatinine, Ser: 1.13 mg/dL (ref 0.50–1.35)
GFR calc Af Amer: 82 mL/min — ABNORMAL LOW (ref >90)
GFR calc non Af Amer: 70 mL/min — ABNORMAL LOW (ref >90)
Glucose, Bld: 157 mg/dL — ABNORMAL HIGH (ref 70–99)
Potassium: 4.4 mEq/L (ref 3.7–5.3)
Sodium: 135 mEq/L — ABNORMAL LOW (ref 137–147)

## 2013-12-10 LAB — HEMOGLOBIN AND HEMATOCRIT, BLOOD
HCT: 28.4 % — ABNORMAL LOW (ref 39.0–52.0)
HEMOGLOBIN: 9.9 g/dL — AB (ref 13.0–17.0)

## 2013-12-10 LAB — CREATININE, FLUID (PLEURAL, PERITONEAL, JP DRAINAGE): Creat, Fluid: 1.2 mg/dL

## 2013-12-10 MED ORDER — TAMSULOSIN HCL 0.4 MG PO CAPS
0.4000 mg | ORAL_CAPSULE | Freq: Every day | ORAL | Status: DC
  Administered 2013-12-10: 0.4 mg via ORAL
  Filled 2013-12-10 (×2): qty 1

## 2013-12-10 NOTE — Progress Notes (Signed)
Nutrition Brief Note  Patient identified on the Malnutrition Screening Tool (MST) Report  Wt Readings from Last 15 Encounters:  12/09/13 157 lb (71.215 kg)  12/09/13 157 lb (71.215 kg)  12/01/13 157 lb (71.215 kg)  10/13/13 156 lb 6.4 oz (70.943 kg)  09/30/13 156 lb (70.761 kg)  09/30/13 156 lb (70.761 kg)  09/09/13 160 lb 9.6 oz (72.848 kg)  08/19/13 159 lb (72.122 kg)    Body mass index is 20.15 kg/(m^2). Patient meets criteria for Normal weight based on current BMI.   Current diet order is Carb modified, patient is consuming approximately 75% of meals at this time. Labs and medications reviewed.   Pt reported an unintentional wt loss of 10 lbs in past year (5.9% body weight loss, non-severe for time frame). Endorsed a decrease in appetite, and has been consuming two meals with one Glucerna shake/daily. Declined supplements during admit as he does not like vanilla flavor.  Currently eating well, denied nausea/abd pain post meals.  No nutrition interventions warranted at this time. If nutrition issues arise, please consult RD.   Philip Abide MS RD LDN Clinical Dietitian [id number]

## 2013-12-10 NOTE — Progress Notes (Signed)
Pt complained of not being able to urinate and feeling full. Bladder scan showed between 400-500cc in bladder. Dr. Tresa Moore made aware. Order to replace Foley. 90 F Foley placed. 725cc urine returned. Pt tolerated well and feels much relief. Callie Fielding RN

## 2013-12-10 NOTE — Op Note (Signed)
NAMEAVEON, NEWBORN NO.:  [id number]  MEDICAL RECORD NO.:  [id number]  LOCATION:  [id number]                         FACILITY:  [facility]  PHYSICIAN:  Alexis Frock, MD     DATE OF BIRTH:  Dec 09, 1955  DATE OF PROCEDURE:  12/09/2013 DATE OF DISCHARGE:                              OPERATIVE REPORT   DIAGNOSES: 1. Bilateral large renal stones and horseshoe kidney. 2. High insertion of right ureteropelvic junction.  PROCEDURES: 1. Cystoscopy, bilateral pyelograms interpretation. 2. Insertion of bilateral ureteral stents, 6 x 26, no tether. 3. Bilateral robotic-assisted laparoscopic pyelolithotomy. 4. Right robotic pyeloplasty.  SURGEON:  Alexis Frock, MD  ASSISTANTS: 1. Marshall Cork. Jeffie Pollock, M.D. 2. Leta Baptist, P.A.  ESTIMATED BLOOD LOSS:  100 mL.  COMPLICATIONS:  None.  SPECIMEN:  Right and left large renal stones for compositional analysis.  FINDINGS: 1. Dense inflammatory response around bilateral UPJs and renal pelvis,     as expected consistent with chronic partially obstructing stones. 2. Right greater than left large volume nephrolithiasis. 3. Insertion of right UPJ to renal pelvis, was incised and closed in a     Heineke-Mikulicz fashion, thus displacing the UPJ inferiorly more     deepened in position, thus performing the pyeloplasty on the right     side.  DRAINS: 1. Jackson-Pratt dual suction. 2. Foley catheter straight drain.  INDICATION:  Mr. Lem is a pleasant 58 year old gentleman with history of horseshoe kidney and recurrent nephrolithiasis.  He underwent Previous percutaneous surgery years ago at [facility].  He subsequently developed a large bilateral right greater than left renal stones and a horseshoe kidney, and he is referred for consideration of management.  He developed notably left hydroureteronephrosis and the left stone was partially obstructing and the right side appeared to have a high insertion of UPJ.  He has very  aberrant renovascular anatomy as well as some hematuria and smoking history.  He therefore underwent bilateral retrogrades and left diagnostic ureteroscopy, several months ago, to confirm no urothelial lesions and none were seen.  Thus, confirming likely obstruction only from a stone.  Options were discussed for definitive stone management including percutaneous approach which seems feasible on the left side, but not on the right due to anatomy versus robotic pyelolithotomy with and without pyeloplasty versus multi- staged ureteroscopy, and he wished to proceed with bilateral robotic pyelolithotomy.  Informed consent was obtained for same and placed in medical record.  PROCEDURE IN DETAIL:  The patient being Kasper Grisso verified, procedure being cystoscopy, bilateral stents, robotic pyelolithotomy bilaterally, possible pyeloplasty was confirmed.  Procedure was carried out.  Time-out was performed.  Intravenous antibiotics were administered.  General endotracheal anesthesia was introduced.  The patient was placed into a low lithotomy position.  Sterile field was created by prepping and draping the patient's penis, perineum, proximal thighs using iodine x3.  Next, cystourethroscopy was performed using 22- French rigid cystoscope with 12-degree offset lens.  Inspection of anterior and posterior urethra unremarkable.  Inspection of urinary bladder revealed minimal trabeculation.  No diverticula, no calcifications.  The right ureteral orifice was cannulated with a 6- French end-hole catheter and right retrograde pyelogram was obtained.  Right retrograde pyelogram demonstrated  a single right ureter, single system right kidney.  There was a large filling defect in the lower aspect, consistent with a known partial staghorn stone, and there was very high insertion of the ureter.  A 0.038 Glidewire was advanced at the level of the renal pelvis, over which a new 6 x 26 contour stent was  placed.  Good proximal and distal deployments were noted.  Next, the left ureteral orifice was cannulated with a 6-French end-hole catheter and left retrograde pyelogram was obtained.  Left retrograde pyelogram demonstrated a single left ureter, single system left kidney.  There was a filling defect.  The UPJ with very large hydronephrosis above this, consistent with known partially obstructing stone.  A 0.03 Glidewire was advanced at the level of the upper pole, over which a new 6 x 26 double-J stent was placed on the left side.  Good proximal and distal deployments were noted.  The cystoscope was exchanged for a 16-French Foley catheter with 10 mL sterile water in the balloon and connected to  straight drain.  The patient was then completely repositioned in a right side up full flank position, employing axillary roll, 15 degrees table flexion, patient further fashioned to the operative table using 3-inch tape over padding.  A new sterile field was created by first clipper shaving and then prepping and draping his entire right flank using chlorhexidine gluconate and a high-flow low pressure pneumoperitoneum was obtained using Veress technique in the right lower quadrant, having passed the aspiration and drop test.  Next, 12-mm robotic camera port was placed in position approximately 4 fingerbreadths superolateral to the umbilicus.  Laparoscopic examination of peritoneal cavity revealed no significant adhesions and no visceral injury.  Additional ports were then placed as follows; right subcostal 8- mm robotic port, right inferior paramedian 8-mm robotic port approximately 1 handbreadth superior to the pubic ramus, right far lateral 8-mm robotic port approximately 4 fingerbreadths superior to the anterior iliac spine, 12-mm assistant port in the midline at the level just below the umbilicus, and a 5-mm port in the paramedian location subcostally for liver retraction, through which a  self-retaining pressure was applied and fastened to the posterior abdominal wall, placing the liver on gentle superior traction to expose the upper pole of right kidney.  Robot was docked and passed through electronic checks. Attention was directed at development of the right retroperitoneum. Incision was made lateral to the ascending colon from the area of the cecum to the area of the hepatic flexure.  The colon was carefully swept medially.  The duodenum was encountered and very carefully kocherized medially.  The ureter was encountered as it coursed along the psoas muscle, was identified and placed on gentle lateral traction.  The right gonadal vessels were also found and did have a very abnormal course running very superolateral along the edge of the kidney lateral to the ureter.  Dissection was then proceeded within this triangle superiorly towards the area of the UPJ on the right side.  Area of the right UPJ was identified in the renal pelvis.  Carefully swept free of fat circumferentially around this for distance of approximately 3 cm in each direction.  There was significant inflammatory tissue around this area as expected.  The renal pelvis was then incised lateral to the UPJ and inferiorly, and then the larger stone in question was found, removed and placed into an EndoCatch bag for later retrieval.  Careful inspection was performed using robotic guidance of each accessible calyx in the  lower pole.  One additional stone fragments was found and also set aside for compositional analysis.  In order to further displace UPJ inferiorly to allow for better drainage decision was made to perform non-dismembered pyeloplasty by reapproximating the lower half pyelotomy as before, however, top half incision was closed in a Heineke-Mikulicz fashion, thus releasing some of the hourglass deformity of the renal pelvis and bringing the UPJ inferiorly, thus performing a pyeloplasty, was performed  using running 3- 0 V-Loc suture.  As described using two separate suture lines, taking great care not to include the ureteral stent suture line, this was not in the stent, but was visualized within the renal pelvis with good proximal curl.  This was then vigorously watertight.  Hemostasis appeared excellent.  All sponge and needle counts were correct. Notably, the ureter and a lower pole vessels were carefully mobilized and traction applied using vessel loops.  These were also removed at this point.  The patient was tolerating the procedure very well.  There was minimal blood loss.  He was hemodynamically stable.  Decision was made to proceed with contralateral surgery as well.  As such, robot was completely undocked, the previous camera port was closed at the level of fascia using figure-of-eight Vicryl and all port sites to the right of the midline were closed at the level of skin using subcuticular Monocryl followed by Dermabond and infiltrated with dilute lyophilized Marcaine. The EndoCatch bag string was brought through the medial assistant port, which was purposely left open and Ioban was placed over this sterilely. The patient was then completely repositioned into a left side up full flank position exactly mirror images as previous and found to be suitably positioned.  A new sterile field was created by prepping and draping the patient's entire left flank using chlorhexidine gluconate as well as the previous medial port site and EndoCatch bag string. Pneumoperitoneum was re-established by inserting the 12-mm short assistant port site through the previous incision.  Pneumoperitoneum was easily obtained.  New robotic camera port was placed approximately 4 fingerbreadths superolateral to the umbilicus on the left side. Superior subcostal robotic port was placed as well as a far lateral approximately 4 fingerbreadths superior into the left anterosuperior iliac spine.  Robot was docked  and passed through electronic checks. Attention was directed at development of the retroperitoneum on the left side. Incision was made lateral to the descending colon from the area of the splenic flexure towards the area of the internal ring.  This was carefully swept medially.  The ureter was encountered as it coursed along the psoas muscle and was carefully placed on gentle lateral traction.  As per the right side, the gonadal vessels were quite aberrant and ran superolaterally, coursing over the anterior surface of the kidney.  These were doubly ligated using cold clips and divided as they crossed the area of the UPJ and renal pelvis to better expose this area.  Dissection proceeded medial to the ureter towards the area of the UPJ and renal pelvis and superior to the UPJ, incision was made directly on top of the stone which was then carefully removed using robotic guidance, placed in a separate EndoCatch bag. Imaging was reviewed, and it was felt that all left sided stone had been removed, and this pyelotomy was closed at the level of the renal pelvis using running V-Loc suture 3-0.  The proximal curl of the stent was visualized within the renal pelvis and was not included in the renal pelvis repair.  This appeared to be grossly watertight.  A closed suction drain was brought through the previous left lateral most port site.  Robot was then undocked. Specimens were retrieved by extending the medial assistant port site for approximately 2 fingerbreadths, removing the specimens, setting aside for permanent pathology.  This extraction site was closed at the level of fascia using figure-of-eight PDS x2 followed by Scarpa's using 2-0 Vicryl.  The camera port site on the left site was closed using figure-of-eight Vicryl at the level of fascia.  All incision sites were infiltrated with dilute lyophilized Marcaine and closed at the level of skin using subcuticular Monocryl followed by Dermabond.   Drain system was applied.  Procedure was then terminated.  The patient tolerated the procedure well without immediate periprocedural complications.  The patient was taken to postanesthesia care unit in stable condition.          ______________________________ Alexis Frock, MD     TM/MEDQ  D:  12/09/2013  T:  12/10/2013  Job:  [id number]

## 2013-12-10 NOTE — Progress Notes (Signed)
1 Day Post-Op  Subjective:  1 - Bilateral Large Nephrolithiasis in Horseshoe Kidney - s/p bilateral robotic pyelolithotomy with bilateral ureteral stenting 12/09/2013. Foley removed 7/9.   Today Philip Richardson is without complaints. Urine clearing, drain output minimal, no fevers, pain controlled.     Objective: Vital signs in last 24 hours: Temp:  [96.2 F (35.7 C)-98.7 F (37.1 C)] 98.7 F (37.1 C) (07/09 0614) Pulse Rate:  [74-105] 105 (07/09 0614) Resp:  [10-18] 18 (07/09 0614) BP: (104-138)/(62-86) 104/66 mmHg (07/09 0614) SpO2:  [98 %-100 %] 98 % (07/09 0614) Last BM Date: 12/08/13  Intake/Output from previous day: 07/08 0701 - 07/09 0700 In: 4485 [P.O.:360; I.V.:4125] Out: 2130 [Urine:1875; Drains:155; Blood:100] Intake/Output this shift:    General appearance: alert, cooperative, appears stated age and friend at bedside Head: Normocephalic, without obvious abnormality, atraumatic Nose: Nares normal. Septum midline. Mucosa normal. No drainage or sinus tenderness. Throat: lips, mucosa, and tongue normal; teeth and gums normal Neck: supple, symmetrical, trachea midline Back: symmetric, no curvature. ROM normal. No CVA tenderness. Resp: non-labored on room air Cardio: Nl rate GI: soft, non-tender; bowel sounds normal; no masses,  no organomegaly Male genitalia: normal, foley removed, urine light pink.  Extremities: extremities normal, atraumatic, no cyanosis or edema Pulses: 2+ and symmetric Skin: Skin color, texture, turgor normal. No rashes or lesions Lymph nodes: Cervical, supraclavicular, and axillary nodes normal. Neurologic: Grossly normal Incision/Wound: Recent port sites c/d/i. JP with modest serosanguinous fluid.   Lab Results:   Recent Labs  12/09/13 1501 12/10/13 0427  HGB 11.2* 9.9*  HCT 31.6* 28.4*   BMET  Recent Labs  12/10/13 0427  NA 135*  K 4.4  CL 99  CO2 26  GLUCOSE 157*  BUN 13  CREATININE 1.13  CALCIUM 8.0*   PT/INR No results found  for this basename: LABPROT, INR,  in the last 72 hours ABG No results found for this basename: PHART, PCO2, PO2, HCO3,  in the last 72 hours  Studies/Results: Dg Abd 1 View  12/09/2013   CLINICAL DATA:  Horseshoe kidney.  Bilateral nephrolithiasis.  EXAM: DG C-ARM 1-60 MIN - NRPT [facility]; ABDOMEN - 1 VIEW  COMPARISON:  CT 09/09/2013.  Retrograde pyelogram 09/30/2013  FINDINGS: Horseshoe kidney noted.  Right ureter was injected with contrast. Mild right hydronephrosis. The proximally ureter was not opacified. Right ureteral stent successfully placed in good position  Left ureter was injected. There is marked hydronephrosis with dilatation of the renal pelvis with a large stone. There is a stricture at the left UPJ as noted on the prior study. Left ureteral stent placed in good position.  IMPRESSION: Bilateral hydronephrosis left greater than right. Bilateral nephrolithiasis. Irregular stricture at the left UPJ.  Bilateral ureteral stents were successfully placed.   Electronically Signed   By: Franchot Gallo M.D.   On: 12/09/2013 10:10   Dg C-arm 1-60 Min-no Report  12/09/2013   CLINICAL DATA:  Horseshoe kidney.  Bilateral nephrolithiasis.  EXAM: DG C-ARM 1-60 MIN - NRPT [facility]; ABDOMEN - 1 VIEW  COMPARISON:  CT 09/09/2013.  Retrograde pyelogram 09/30/2013  FINDINGS: Horseshoe kidney noted.  Right ureter was injected with contrast. Mild right hydronephrosis. The proximally ureter was not opacified. Right ureteral stent successfully placed in good position  Left ureter was injected. There is marked hydronephrosis with dilatation of the renal pelvis with a large stone. There is a stricture at the left UPJ as noted on the prior study. Left ureteral stent placed in good position.  IMPRESSION: Bilateral  hydronephrosis left greater than right. Bilateral nephrolithiasis. Irregular stricture at the left UPJ.  Bilateral ureteral stents were successfully placed.   Electronically Signed   By: Franchot Gallo M.D.   On:  12/09/2013 10:10    Anti-infectives: Anti-infectives   Start     Dose/Rate Route Frequency Ordered Stop   12/09/13 0715  clindamycin (CLEOCIN) IVPB 900 mg     900 mg 100 mL/hr over 30 Minutes Intravenous  Once 12/09/13 0709 12/09/13 0846   12/09/13 0715  gentamicin (GARAMYCIN) 360 mg in dextrose 5 % 100 mL IVPB     5 mg/kg  71.2 kg 109 mL/hr over 60 Minutes Intravenous On call to O.R. 12/09/13 0709 12/09/13 0934      Assessment/Plan:  1 - Bilateral Large Nephrolithiasis in Horseshoe Kidney - doing well POD1.DC foley and monitor JP output. Continue ambulation. Likely DC tomorrow AM.   Philip Richardson, Philip Richardson 12/10/2013

## 2013-12-11 LAB — GLUCOSE, CAPILLARY: Glucose-Capillary: 149 mg/dL — ABNORMAL HIGH (ref 70–99)

## 2013-12-11 MED ORDER — OXYCODONE-ACETAMINOPHEN 5-325 MG PO TABS: 1.0000 | ORAL_TABLET | Freq: Four times a day (QID) | ORAL | Status: DC | PRN

## 2013-12-11 MED ORDER — SENNOSIDES-DOCUSATE SODIUM 8.6-50 MG PO TABS: 1.0000 | ORAL_TABLET | Freq: Two times a day (BID) | ORAL | Status: DC

## 2013-12-11 MED ORDER — SULFAMETHOXAZOLE-TMP DS 800-160 MG PO TABS: 1.0000 | ORAL_TABLET | Freq: Two times a day (BID) | ORAL | Status: DC

## 2013-12-11 MED ORDER — TAMSULOSIN HCL 0.4 MG PO CAPS: 0.4000 mg | ORAL_CAPSULE | Freq: Every day | ORAL | Status: DC

## 2013-12-11 NOTE — Discharge Summary (Signed)
Physician Discharge Summary  Patient ID: Philip Richardson MRN: [id number] DOB/AGE: 06/27/1955 58 y.o.  Admit date: 12/09/2013 Discharge date: 12/11/2013  Admission Diagnoses: Bilateral Large Nephrolithiasis in Horseshoe Kidney  Discharge Diagnoses: Bilateral Large Nephrolithiasis in Horseshoe Kidney, Urinary Retention Active Problems:   Staghorn kidney stones   Discharged Condition: good  Hospital Course:     1 - Bilateral Large Nephrolithiasis in Horseshoe Kidney - s/p bilateral robotic pyelolithotomy with bilateral ureteral stenting 12/09/2013. JP removed 7/10 as JP Cr 1.2 (consistent with serum and low output).   2 - Urinary Retention  - pt with h/o prior post-op retention x several. Failed trial of void 7/9 and foley replaced and started on tamsulosin. Plan for removal in 3 days in office.  By 7/10, the day of discharge, pt ambulatory, pain controlled, no wound problems, tolerating regular diet, and felt to be adequate for discharge.   Consults: None  Significant Diagnostic Studies: labs: JP Cr 1.2.  Treatments: surgery: bilateral robotic pyelolithotomy with bilateral ureteral stenting 12/09/2013  Discharge Exam: Blood pressure 132/75, pulse 100, temperature 99.5 F (37.5 C), temperature source Oral, resp. rate 20, height 6\' 2"  (1.88 m), weight 71.215 kg (157 lb), SpO2 98.00%. General appearance: alert, cooperative, appears stated age and friend at bedside Head: Normocephalic, without obvious abnormality, atraumatic Nose: Nares normal. Septum midline. Mucosa normal. No drainage or sinus tenderness. Throat: lips, mucosa, and tongue normal; teeth and gums normal Neck: supple, symmetrical, trachea midline Back: symmetric, no curvature. ROM normal. No CVA tenderness. Resp: non-labored Chest wall: no tenderness Cardio: Nl rate GI: soft, non-tender; bowel sounds normal; no masses,  no organomegaly Male genitalia: normal, foley c/d/i with yellow urine.  Extremities: extremities  normal, atraumatic, no cyanosis or edema Pulses: 2+ and symmetric Skin: Skin color, texture, turgor normal. No rashes or lesions Lymph nodes: Cervical, supraclavicular, and axillary nodes normal. Neurologic: Grossly normal Incision/Wound: recent port sites c/d/i. Small blister from tape at left most site. JP removed and dry dressing applied, fluid in bulb scant serous.   Disposition: 01-Home or Self Care     Medication List    STOP taking these medications       CHOLESTEROL DEFENSE PO     multivitamin with minerals Tabs tablet     Vitamin D3 2000 UNITS Tabs      TAKE these medications       HYDROcodone-acetaminophen 5-325 MG per tablet  Commonly known as:  NORCO  Take 1-2 tablets by mouth every 6 (six) hours as needed.     Linagliptin-Metformin HCl 2.10-998 MG Tabs  Take 1 tablet by mouth 2 (two) times daily with a meal.     lisinopril 10 MG tablet  Commonly known as:  PRINIVIL,ZESTRIL  Take 10 mg by mouth every morning.     ONETOUCH DELICA LANCETS [id number] Misc     oxyCODONE-acetaminophen 5-325 MG per tablet  Commonly known as:  ROXICET  Take 1-2 tablets by mouth every 6 (six) hours as needed for moderate pain or severe pain. Post-operatively     Pen Needles 31G X 6 MM Misc     senna-docusate 8.6-50 MG per tablet  Commonly known as:  Senokot-S  Take 1 tablet by mouth 2 (two) times daily. While taking pain meds to prevent constipation     sulfamethoxazole-trimethoprim 800-160 MG per tablet  Commonly known as:  BACTRIM DS  Take 1 tablet by mouth 2 (two) times daily. X 4 days to prevent infection     tamsulosin 0.4 MG Caps capsule  Commonly known as:  FLOMAX  Take 1 capsule (0.4 mg total) by mouth daily. For urination.           Follow-up Information   Follow up with Alexis Frock, MD On 12/22/2013. (10:30)    Specialty:  Urology   Contact information:   [place] [address] [phone]       Follow up with Alexis Frock, MD On 12/14/2013.  (for office catheter removal by Dr. Zettie Pho nurse.)    Specialty:  Urology   Contact information:   [place] [address] [phone]       Signed: Alexis Frock 12/11/2013, 6:51 AM

## 2014-01-13 ENCOUNTER — Encounter: Payer: Self-pay | Admitting: Nurse Practitioner

## 2014-01-13 ENCOUNTER — Ambulatory Visit (INDEPENDENT_AMBULATORY_CARE_PROVIDER_SITE_OTHER): Payer: BC Managed Care – PPO | Admitting: Nurse Practitioner

## 2014-01-13 VITALS — BP 105/66 | HR 82 | Temp 97.5°F | Ht 74.0 in | Wt 153.2 lb

## 2014-01-13 DIAGNOSIS — E119 Type 2 diabetes mellitus without complications: Secondary | ICD-10-CM

## 2014-01-13 DIAGNOSIS — I10 Essential (primary) hypertension: Secondary | ICD-10-CM

## 2014-01-13 LAB — POCT GLYCOSYLATED HEMOGLOBIN (HGB A1C)

## 2014-01-13 NOTE — Patient Instructions (Signed)

## 2014-01-13 NOTE — Progress Notes (Signed)
Subjective:    Patient ID: Philip Richardson, male    DOB: [birth year], 58 y.o.   MRN: [id number]  Patient here today for follow up of chronic medical problems. Says that he feels much better since blood sugars have started coming down.   Hypertension This is a chronic problem. The current episode started more than 1 year ago. The problem is unchanged. The problem is controlled. Pertinent negatives include no chest pain, headaches, neck pain, palpitations, peripheral edema or shortness of breath. There are no associated agents to hypertension. Risk factors for coronary artery disease include dyslipidemia, family history, diabetes mellitus and male gender. Past treatments include ACE inhibitors. The current treatment provides significant improvement. There are no compliance problems.   Diabetes He presents for his follow-up diabetic visit. He has type 2 diabetes mellitus. No MedicAlert identification noted. The initial diagnosis of diabetes was made 6 months ago. His disease course has been stable. Pertinent negatives for hypoglycemia include no headaches. Pertinent negatives for diabetes include no chest pain, no polydipsia, no polyphagia, no polyuria, no visual change and no weight loss. Symptoms are improving. Risk factors for coronary artery disease include dyslipidemia, family history, diabetes mellitus, male sex and hypertension. Current diabetic treatment includes diet and oral agent (dual therapy). He is compliant with treatment most of the time. His weight is stable. He is following a diabetic diet. When asked about meal planning, he reported none. He has not had a previous visit with a dietician. He rarely participates in exercise. His home blood glucose trend is decreasing rapidly. His breakfast blood glucose is taken between 8-9 am. His breakfast blood glucose range is generally 110-130 mg/dl. His overall blood glucose range is 180-200 mg/dl. An ACE inhibitor/angiotensin II receptor blocker is  being taken. He does not see a podiatrist.Eye exam is current.      Review of Systems  Constitutional: Negative for weight loss.  Respiratory: Negative for shortness of breath.   Cardiovascular: Negative for chest pain and palpitations.  Endocrine: Negative for polydipsia, polyphagia and polyuria.  Musculoskeletal: Negative for neck pain.  Neurological: Negative for headaches.  All other systems reviewed and are negative.      Objective:   Physical Exam  Constitutional: He is oriented to person, place, and time. He appears well-developed and well-nourished.  HENT:  Head: Normocephalic.  Right Ear: External ear normal.  Left Ear: External ear normal.  Nose: Nose normal.  Mouth/Throat: Oropharynx is clear and moist.  Eyes: EOM are normal. Pupils are equal, round, and reactive to light.  Neck: Normal range of motion. Neck supple. No JVD present. No thyromegaly present.  Cardiovascular: Normal rate, regular rhythm, normal heart sounds and intact distal pulses.  Exam reveals no gallop and no friction rub.   No murmur heard. Pulmonary/Chest: Effort normal and breath sounds normal. No respiratory distress. He has no wheezes. He has no rales. He exhibits no tenderness.  Abdominal: Soft. Bowel sounds are normal. He exhibits no mass. There is no tenderness.  Musculoskeletal: Normal range of motion. He exhibits no edema.  Lymphadenopathy:    He has no cervical adenopathy.  Neurological: He is alert and oriented to person, place, and time. No cranial nerve deficit.  Skin: Skin is warm and dry.  Psychiatric: He has a normal mood and affect. His behavior is normal. Judgment and thought content normal.   BP 105/66  Pulse 82  Temp(Src) 97.5 F (36.4 C) (Oral)  Ht '6\' 2"'  (1.88 m)  Wt 153 lb 3.2 oz (  69.491 kg)  BMI 19.66 kg/m2  Results for orders placed in visit on 01/13/14  POCT GLYCOSYLATED HEMOGLOBIN (HGB A1C)      Result Value Ref Range   Hemoglobin A1C 6.5%              Assessment & Plan:   1. Type 2 diabetes mellitus without complication   2. Essential hypertension    Orders Placed This Encounter  Procedures  . CMP14+EGFR  . NMR, lipoprofile  . POCT glycosylated hemoglobin (Hb A1C)   Patient will do colonoscopy once heals from kidney surgery- will discuss at next appointment Labs pending Health maintenance reviewed Diet and exercise encouraged Continue all meds Follow up  In 3 months   [place], FNP

## 2014-01-14 LAB — CMP14+EGFR
A/G RATIO: 1.7 (ref 1.1–2.5)
ALT: 14 IU/L (ref 0–44)
AST: 16 IU/L (ref 0–40)
Albumin: 4.5 g/dL (ref 3.5–5.5)
Alkaline Phosphatase: 70 IU/L (ref 39–117)
BILIRUBIN TOTAL: 0.6 mg/dL (ref 0.0–1.2)
BUN/Creatinine Ratio: 13 (ref 9–20)
BUN: 15 mg/dL (ref 6–24)
CALCIUM: 10.1 mg/dL (ref 8.7–10.2)
CO2: 26 mmol/L (ref 18–29)
Chloride: 91 mmol/L — ABNORMAL LOW (ref 97–108)
Creatinine, Ser: 1.15 mg/dL (ref 0.76–1.27)
GFR calc non Af Amer: 70 mL/min/{1.73_m2} (ref >59)
GFR, EST AFRICAN AMERICAN: 81 mL/min/{1.73_m2} (ref >59)
Globulin, Total: 2.7 g/dL (ref 1.5–4.5)
Glucose: 121 mg/dL — ABNORMAL HIGH (ref 65–99)
POTASSIUM: 4.6 mmol/L (ref 3.5–5.2)
SODIUM: 136 mmol/L (ref 134–144)
Total Protein: 7.2 g/dL (ref 6.0–8.5)

## 2014-01-14 LAB — NMR, LIPOPROFILE
Cholesterol: 181 mg/dL (ref 100–199)
HDL Cholesterol by NMR: 49 mg/dL (ref >39)
HDL Particle Number: 32 umol/L (ref >=30.5)
LDL Particle Number: 1474 nmol/L — ABNORMAL HIGH (ref <1000)
LDL Size: 20.8 nm (ref >20.5)
LDLC SERPL CALC-MCNC: 103 mg/dL — ABNORMAL HIGH (ref 0–99)
LP-IR Score: 53 — ABNORMAL HIGH (ref <=45)
SMALL LDL PARTICLE NUMBER: 729 nmol/L — AB (ref <=527)
Triglycerides by NMR: 143 mg/dL (ref 0–149)

## 2014-01-19 ENCOUNTER — Other Ambulatory Visit: Payer: Self-pay | Admitting: Nurse Practitioner

## 2014-01-19 MED ORDER — ATORVASTATIN CALCIUM 40 MG PO TABS: 40.0000 mg | ORAL_TABLET | Freq: Every day | ORAL | Status: DC

## 2014-01-20 ENCOUNTER — Telehealth: Payer: Self-pay | Admitting: Family Medicine

## 2014-01-20 NOTE — Telephone Encounter (Signed)
Message copied by Waverly Ferrari on Wed Jan 20, 2014 10:31 AM ------      Message from: Chevis Pretty      Created: Tue Jan 19, 2014  9:09 AM       Hgba1c discussed at appointment      Kidney and liver function stable      ldl particle numbers and LDL are elevated- need to be on a statin- lipitor rx sent to pharmacy      Continue current meds- low fat diet and exercise and recheck in 3 months       ------

## 2014-03-13 ENCOUNTER — Other Ambulatory Visit: Payer: Self-pay | Admitting: Nurse Practitioner

## 2014-04-21 ENCOUNTER — Ambulatory Visit (INDEPENDENT_AMBULATORY_CARE_PROVIDER_SITE_OTHER): Payer: BC Managed Care – PPO | Admitting: Nurse Practitioner

## 2014-04-21 ENCOUNTER — Encounter: Payer: Self-pay | Admitting: Nurse Practitioner

## 2014-04-21 VITALS — BP 129/73 | HR 75 | Temp 98.0°F | Ht 74.0 in | Wt 158.6 lb

## 2014-04-21 DIAGNOSIS — E119 Type 2 diabetes mellitus without complications: Secondary | ICD-10-CM

## 2014-04-21 DIAGNOSIS — I10 Essential (primary) hypertension: Secondary | ICD-10-CM

## 2014-04-21 DIAGNOSIS — E785 Hyperlipidemia, unspecified: Secondary | ICD-10-CM

## 2014-04-21 LAB — POCT GLYCOSYLATED HEMOGLOBIN (HGB A1C): HEMOGLOBIN A1C: 6.5

## 2014-04-21 MED ORDER — LINAGLIPTIN-METFORMIN HCL 2.5-1000 MG PO TABS: ORAL_TABLET | ORAL | Status: DC

## 2014-04-21 MED ORDER — LISINOPRIL 10 MG PO TABS: 10.0000 mg | ORAL_TABLET | Freq: Every morning | ORAL | Status: DC

## 2014-04-21 MED ORDER — ATORVASTATIN CALCIUM 40 MG PO TABS: 40.0000 mg | ORAL_TABLET | Freq: Every day | ORAL | Status: DC

## 2014-04-21 NOTE — Progress Notes (Signed)
Subjective:    Patient ID: Philip Richardson, male    DOB: 01-Apr-1956, 58 y.o.   MRN: [id number]  Patient here today for follow up of chronic medical problems. Says that he feels much better since blood sugars have started coming down.   Hypertension This is a chronic problem. The current episode started more than 1 year ago. The problem is unchanged. The problem is controlled. Pertinent negatives include no chest pain, headaches, neck pain, palpitations or shortness of breath. Risk factors for coronary artery disease include male gender, diabetes mellitus and dyslipidemia. Past treatments include ACE inhibitors. The current treatment provides moderate improvement. Compliance problems include diet and exercise.   Diabetes He presents for his follow-up diabetic visit. He has type 2 diabetes mellitus. No MedicAlert identification noted. His disease course has been stable. There are no hypoglycemic associated symptoms. Pertinent negatives for hypoglycemia include no headaches. Pertinent negatives for diabetes include no chest pain, no polydipsia, no polyphagia, no polyuria and no visual change. There are no hypoglycemic complications. There are no diabetic complications. Risk factors for coronary artery disease include diabetes mellitus, dyslipidemia, hypertension, male sex and family history. Current diabetic treatment includes oral agent (dual therapy). He is compliant with treatment most of the time. When asked about meal planning, he reported none. He has not had a previous visit with a dietitian. He participates in exercise every other day. His breakfast blood glucose is taken between 9-10 am. His breakfast blood glucose range is generally 110-130 mg/dl. An ACE inhibitor/angiotensin II receptor blocker is being taken. He does not see a podiatrist.Eye exam is current.  Hyperlipidemia This is a chronic problem. The current episode started more than 1 year ago. The problem is uncontrolled. Recent lipid  tests were reviewed and are variable. Exacerbating diseases include diabetes. He has no history of hypothyroidism or obesity. Pertinent negatives include no chest pain or shortness of breath. Current antihyperlipidemic treatment includes statins. The current treatment provides moderate improvement of lipids. Compliance problems include adherence to diet.  Risk factors for coronary artery disease include diabetes mellitus, dyslipidemia, hypertension and male sex.      Review of Systems  Constitutional: Negative.   HENT: Negative.   Respiratory: Negative for shortness of breath.   Cardiovascular: Negative for chest pain and palpitations.  Endocrine: Negative for polydipsia, polyphagia and polyuria.  Musculoskeletal: Negative for neck pain.  Neurological: Negative for headaches.  Psychiatric/Behavioral: Negative.   All other systems reviewed and are negative.      Objective:   Physical Exam  Constitutional: He is oriented to person, place, and time. He appears well-developed and well-nourished.  HENT:  Head: Normocephalic.  Right Ear: External ear normal.  Left Ear: External ear normal.  Nose: Nose normal.  Mouth/Throat: Oropharynx is clear and moist.  Eyes: EOM are normal. Pupils are equal, round, and reactive to light.  Neck: Normal range of motion. Neck supple. No JVD present. No thyromegaly present.  Cardiovascular: Normal rate, regular rhythm, normal heart sounds and intact distal pulses.  Exam reveals no gallop and no friction rub.   No murmur heard. Pulmonary/Chest: Effort normal and breath sounds normal. No respiratory distress. He has no wheezes. He has no rales. He exhibits no tenderness.  Abdominal: Soft. Bowel sounds are normal. He exhibits no mass. There is no tenderness.  Genitourinary: Penis normal.  refuses rectal exam  Musculoskeletal: Normal range of motion. He exhibits no edema.  Lymphadenopathy:    He has no cervical adenopathy.  Neurological: He is alert  and  oriented to person, place, and time. No cranial nerve deficit.  Skin: Skin is warm and dry.  Psychiatric: He has a normal mood and affect. His behavior is normal. Judgment and thought content normal.   BP 129/73 mmHg  Pulse 75  Temp(Src) 98 F (36.7 C) (Oral)  Ht '6\' 2"'  (1.88 m)  Wt 158 lb 9.6 oz (71.94 kg)  BMI 20.35 kg/m2  Results for orders placed or performed in visit on 04/21/14  POCT glycosylated hemoglobin (Hb A1C)  Result Value Ref Range   Hemoglobin A1C 6.5%   HM DIABETES EYE EXAM  Result Value Ref Range   HM Diabetic Eye Exam No Retinopathy No Retinopathy      Assessment & Plan:   1. Type 2 diabetes mellitus without complication Continue to watch carbs in diet - POCT glycosylated hemoglobin (Hb A1C) - NMR, lipoprofile - Linagliptin-Metformin HCl (JENTADUETO) 2.10-998 MG TABS; TAKE  (1)  TABLET TWICE A DAY WITH MEALS (BREAKFAST AND SUPPER)  Dispense: 180 tablet; Refill: 1  2. Essential hypertension Low Na+ diet - CMP14+EGFR - lisinopril (PRINIVIL,ZESTRIL) 10 MG tablet; Take 1 tablet (10 mg total) by mouth every morning.  Dispense: 90 tablet; Refill: 1  3. Hyperlipidemia with target LDL less than 100 Low fat diet - atorvastatin (LIPITOR) 40 MG tablet; Take 1 tablet (40 mg total) by mouth daily.  Dispense: 90 tablet; Refill: 1  Referral made for colonoscopy and patient is suppose to call and make appointment Refuses flu vaccine Labs pending Health maintenance reviewed Diet and exercise encouraged Continue all meds Follow up  In 3 months   [place], FNP

## 2014-04-21 NOTE — Patient Instructions (Signed)

## 2014-04-22 LAB — NMR, LIPOPROFILE
Cholesterol: 176 mg/dL (ref 100–199)
HDL CHOLESTEROL BY NMR: 46 mg/dL (ref >39)
HDL Particle Number: 31.2 umol/L (ref >=30.5)
LDL PARTICLE NUMBER: 1351 nmol/L — AB (ref <1000)
LDL SIZE: 20.9 nm (ref >20.5)
LDL-C: 110 mg/dL — ABNORMAL HIGH (ref 0–99)
LP-IR Score: 44 (ref <=45)
Small LDL Particle Number: 623 nmol/L — ABNORMAL HIGH (ref <=527)
Triglycerides by NMR: 100 mg/dL (ref 0–149)

## 2014-04-22 LAB — CMP14+EGFR
ALT: 18 IU/L (ref 0–44)
AST: 21 IU/L (ref 0–40)
Albumin/Globulin Ratio: 1.6 (ref 1.1–2.5)
Albumin: 4.4 g/dL (ref 3.5–5.5)
Alkaline Phosphatase: 83 IU/L (ref 39–117)
BUN/Creatinine Ratio: 12 (ref 9–20)
BUN: 13 mg/dL (ref 6–24)
CALCIUM: 10.2 mg/dL (ref 8.7–10.2)
CHLORIDE: 95 mmol/L — AB (ref 97–108)
CO2: 26 mmol/L (ref 18–29)
Creatinine, Ser: 1.12 mg/dL (ref 0.76–1.27)
GFR calc Af Amer: 83 mL/min/{1.73_m2} (ref >59)
GFR calc non Af Amer: 72 mL/min/{1.73_m2} (ref >59)
Globulin, Total: 2.7 g/dL (ref 1.5–4.5)
Glucose: 116 mg/dL — ABNORMAL HIGH (ref 65–99)
POTASSIUM: 4.5 mmol/L (ref 3.5–5.2)
Sodium: 140 mmol/L (ref 134–144)
Total Bilirubin: 0.3 mg/dL (ref 0.0–1.2)
Total Protein: 7.1 g/dL (ref 6.0–8.5)

## 2014-09-24 ENCOUNTER — Other Ambulatory Visit: Payer: Self-pay | Admitting: Nurse Practitioner

## 2014-11-30 ENCOUNTER — Other Ambulatory Visit: Payer: Self-pay | Admitting: Nurse Practitioner

## 2014-11-30 NOTE — Telephone Encounter (Signed)
Last seen 04/11/14  MMM

## 2014-11-30 NOTE — Telephone Encounter (Signed)
no more refills without being seen  

## 2014-12-28 ENCOUNTER — Other Ambulatory Visit: Payer: Self-pay | Admitting: Nurse Practitioner

## 2015-01-05 ENCOUNTER — Telehealth: Payer: Self-pay

## 2015-01-05 MED ORDER — SAXAGLIPTIN-METFORMIN ER 2.5-1000 MG PO TB24: 1.0000 | ORAL_TABLET | Freq: Every day | ORAL | Status: DC

## 2015-01-05 NOTE — Telephone Encounter (Signed)
Insurance denied [email or web address]. Needs to try kombiglyze first which is on his formulary.  Shelah Lewandowsky is out of the office today. Will you authorize a switch to kombiglyze?  If so, review and sign setup prescription so that it will transmit electronically.

## 2015-01-05 NOTE — Telephone Encounter (Signed)
May switch patient to agent that insurance will pay for

## 2015-01-05 NOTE — Addendum Note (Signed)
Addended by: Holley Dexter on: 01/05/2015 03:34 PM   Modules accepted: Orders

## 2015-01-05 NOTE — Telephone Encounter (Signed)
[employer] denied prior authorization for [employer]   Must try Kombiglyze first

## 2015-01-06 ENCOUNTER — Other Ambulatory Visit: Payer: Self-pay | Admitting: Nurse Practitioner

## 2015-01-06 NOTE — Telephone Encounter (Signed)
Patient has not been seen since NOvember 2015- ntbs to change meds

## 2015-01-07 ENCOUNTER — Telehealth: Payer: Self-pay | Admitting: *Deleted

## 2015-01-07 ENCOUNTER — Telehealth: Payer: Self-pay | Admitting: Nurse Practitioner

## 2015-01-07 NOTE — Telephone Encounter (Signed)
Call given to rx

## 2015-01-07 NOTE — Telephone Encounter (Signed)
Left message, need to schedule an appointment to review medication changes and have lab work..  Insurance is refusing to pay for one of his current diabetic medications.  Will need to have blood work to check levels for his diabetes and continue from there.

## 2015-01-08 ENCOUNTER — Other Ambulatory Visit: Payer: Self-pay | Admitting: Nurse Practitioner

## 2015-01-08 ENCOUNTER — Telehealth: Payer: Self-pay

## 2015-01-08 MED ORDER — LINAGLIPTIN 5 MG PO TABS: 5.0000 mg | ORAL_TABLET | Freq: Every day | ORAL | Status: DC

## 2015-01-08 MED ORDER — METFORMIN HCL 1000 MG PO TABS: 1000.0000 mg | ORAL_TABLET | Freq: Two times a day (BID) | ORAL | Status: DC

## 2015-01-08 NOTE — Telephone Encounter (Signed)
Gave patient tradjenta sampoles to take 1/2 tablet daily and called in rx for metformin 1000mg  BID- patient aware.

## 2015-01-08 NOTE — Telephone Encounter (Signed)
Patient returned call, informed that he needs to come in for labwork and to discuss medication.  Patient has appointment scheduled

## 2015-01-08 NOTE — Addendum Note (Signed)
Addended byCarrolyn Leigh on: 01/08/2015 09:39 AM   Modules accepted: Orders, Medications

## 2015-01-13 NOTE — Telephone Encounter (Signed)
Patient aware.

## 2015-02-01 ENCOUNTER — Ambulatory Visit (INDEPENDENT_AMBULATORY_CARE_PROVIDER_SITE_OTHER): Payer: BLUE CROSS/BLUE SHIELD | Admitting: Nurse Practitioner

## 2015-02-01 ENCOUNTER — Encounter (INDEPENDENT_AMBULATORY_CARE_PROVIDER_SITE_OTHER): Payer: Self-pay

## 2015-02-01 ENCOUNTER — Encounter: Payer: Self-pay | Admitting: Nurse Practitioner

## 2015-02-01 VITALS — BP 138/84 | HR 66 | Temp 96.8°F | Ht 74.0 in | Wt 167.0 lb

## 2015-02-01 DIAGNOSIS — E785 Hyperlipidemia, unspecified: Secondary | ICD-10-CM

## 2015-02-01 DIAGNOSIS — I10 Essential (primary) hypertension: Secondary | ICD-10-CM

## 2015-02-01 DIAGNOSIS — E119 Type 2 diabetes mellitus without complications: Secondary | ICD-10-CM

## 2015-02-01 LAB — POCT GLYCOSYLATED HEMOGLOBIN (HGB A1C): HEMOGLOBIN A1C: 6.6

## 2015-02-01 MED ORDER — LISINOPRIL 10 MG PO TABS: ORAL_TABLET | ORAL | Status: DC

## 2015-02-01 MED ORDER — ATORVASTATIN CALCIUM 40 MG PO TABS: 40.0000 mg | ORAL_TABLET | Freq: Every day | ORAL | Status: DC

## 2015-02-01 MED ORDER — LINAGLIPTIN 5 MG PO TABS: 5.0000 mg | ORAL_TABLET | Freq: Every day | ORAL | Status: DC

## 2015-02-01 NOTE — Progress Notes (Signed)
Subjective:    Patient ID: Philip Richardson, male    DOB: [birth year], 59 y.o.   MRN: [id number]  Patient here today for follow up of chronic medical problems.   Hypertension This is a chronic problem. The current episode started more than 1 year ago. The problem is unchanged. The problem is controlled. Pertinent negatives include no chest pain, headaches, neck pain, palpitations or shortness of breath. Risk factors for coronary artery disease include male gender, diabetes mellitus and dyslipidemia. Past treatments include ACE inhibitors. The current treatment provides moderate improvement. Compliance problems include diet and exercise.  There is no history of CAD/MI or CVA.  Diabetes He presents for his follow-up diabetic visit. He has type 2 diabetes mellitus. No MedicAlert identification noted. His disease course has been stable. There are no hypoglycemic associated symptoms. Pertinent negatives for hypoglycemia include no headaches. Pertinent negatives for diabetes include no chest pain, no polydipsia, no polyphagia, no polyuria and no visual change. There are no hypoglycemic complications. Symptoms are stable. There are no diabetic complications. Pertinent negatives for diabetic complications include no CVA. Risk factors for coronary artery disease include diabetes mellitus, dyslipidemia, hypertension, male sex and family history. Current diabetic treatment includes oral agent (dual therapy). He is compliant with treatment most of the time. His weight is stable. He is following a diabetic diet. When asked about meal planning, he reported none. He has not had a previous visit with a dietitian. He participates in exercise daily. His breakfast blood glucose is taken between 9-10 am. His breakfast blood glucose range is generally 130-140 mg/dl. An ACE inhibitor/angiotensin II receptor blocker is being taken. He does not see a podiatrist.Eye exam is current (April 2016).  Hyperlipidemia This is a chronic  problem. The current episode started more than 1 year ago. The problem is uncontrolled. Recent lipid tests were reviewed and are variable. Exacerbating diseases include diabetes. He has no history of hypothyroidism or obesity. Pertinent negatives include no chest pain or shortness of breath. Current antihyperlipidemic treatment includes statins. The current treatment provides moderate improvement of lipids. Compliance problems include adherence to diet.  Risk factors for coronary artery disease include diabetes mellitus, dyslipidemia, hypertension and male sex.      Review of Systems  Constitutional: Negative.   HENT: Negative.   Respiratory: Negative.  Negative for shortness of breath.   Cardiovascular: Negative.  Negative for chest pain and palpitations.  Endocrine: Negative for polydipsia, polyphagia and polyuria.  Musculoskeletal: Negative for neck pain.  Neurological: Negative for headaches.  Psychiatric/Behavioral: Negative.   All other systems reviewed and are negative.      Objective:   Physical Exam  Constitutional: He is oriented to person, place, and time. He appears well-developed and well-nourished.  HENT:  Head: Normocephalic.  Right Ear: External ear normal.  Left Ear: External ear normal.  Nose: Nose normal.  Mouth/Throat: Oropharynx is clear and moist.  Eyes: EOM are normal. Pupils are equal, round, and reactive to light.  Neck: Normal range of motion. Neck supple. No JVD present. No thyromegaly present.  Cardiovascular: Normal rate, regular rhythm, normal heart sounds and intact distal pulses.  Exam reveals no gallop and no friction rub.   No murmur heard. Pulmonary/Chest: Effort normal and breath sounds normal. No respiratory distress. He has no wheezes. He has no rales. He exhibits no tenderness.  Abdominal: Soft. Bowel sounds are normal. He exhibits no mass. There is no tenderness.  Genitourinary: Penis normal.  refuses rectal exam  Musculoskeletal: Normal  range  of motion. He exhibits no edema.  Lymphadenopathy:    He has no cervical adenopathy.  Neurological: He is alert and oriented to person, place, and time. No cranial nerve deficit.  Skin: Skin is warm and dry.  Psychiatric: He has a normal mood and affect. His behavior is normal. Judgment and thought content normal.   BP 138/84 mmHg  Pulse 66  Temp(Src) 96.8 F (36 C) (Oral)  Ht _0  (1.88 m)  Wt 167 lb (75.751 kg)  BMI 21.43 kg/m2   Results for orders placed or performed in visit on 02/01/15  POCT glycosylated hemoglobin (Hb A1C)  Result Value Ref Range   Hemoglobin A1C 6.6         Assessment & Plan:   1. Type 2 diabetes mellitus without complication Continue with diet and exercise. Continue with CBG checks - POCT glycosylated hemoglobin (Hb A1C) - POCT UA - Microalbumin  2. Essential hypertension No salt added to diet - CMP14+EGFR - lisinopril (PRINIVIL,ZESTRIL) 10 MG tablet; TAKE (1) TABLET DAILY IN THE MORNING.  Dispense: 90 tablet; Refill: 1  3. Hyperlipidemia with target LDL less than 100 Avoid fatty foods in diet - Lipid panel - atorvastatin (LIPITOR) 40 MG tablet; Take 1 tablet (40 mg total) by mouth daily.  Dispense: 90 tablet; Refill: 1   Continue all meds Labs pending Health Maintenance reviewed Diet and exercise encouraged RTO 3 months  Mary-Margaret Hassell Done, FNP

## 2015-02-01 NOTE — Patient Instructions (Signed)
Diabetes and Foot Care Diabetes may cause you to have problems because of poor blood supply (circulation) to your feet and legs. This may cause the skin on your feet to become thinner, break easier, and heal more slowly. Your skin may become dry, and the skin may peel and crack. You may also have nerve damage in your legs and feet causing decreased feeling in them. You may not notice minor injuries to your feet that could lead to infections or more serious problems. Taking care of your feet is one of the most important things you can do for yourself.  HOME CARE INSTRUCTIONS  Wear shoes at all times, even in the house. Do not go barefoot. Bare feet are easily injured.  Check your feet daily for blisters, cuts, and redness. If you cannot see the bottom of your feet, use a mirror or ask someone for help.  Wash your feet with warm water (do not use hot water) and mild soap. Then pat your feet and the areas between your toes until they are completely dry. Do not soak your feet as this can dry your skin.  Apply a moisturizing lotion or petroleum jelly (that does not contain alcohol and is unscented) to the skin on your feet and to dry, brittle toenails. Do not apply lotion between your toes.  Trim your toenails straight across. Do not dig under them or around the cuticle. File the edges of your nails with an emery board or nail file.  Do not cut corns or calluses or try to remove them with medicine.  Wear clean socks or stockings every day. Make sure they are not too tight. Do not wear knee-high stockings since they may decrease blood flow to your legs.  Wear shoes that fit properly and have enough cushioning. To break in new shoes, wear them for just a few hours a day. This prevents you from injuring your feet. Always look in your shoes before you put them on to be sure there are no objects inside.  Do not cross your legs. This may decrease the blood flow to your feet.  If you find a minor scrape,  cut, or break in the skin on your feet, keep it and the skin around it clean and dry. These areas may be cleansed with mild soap and water. Do not cleanse the area with peroxide, alcohol, or iodine.  When you remove an adhesive bandage, be sure not to damage the skin around it.  If you have a wound, look at it several times a day to make sure it is healing.  Do not use heating pads or hot water bottles. They may burn your skin. If you have lost feeling in your feet or legs, you may not know it is happening until it is too late.  Make sure your health care provider performs a complete foot exam at least annually or more often if you have foot problems. Report any cuts, sores, or bruises to your health care provider immediately. SEEK MEDICAL CARE IF:   You have an injury that is not healing.  You have cuts or breaks in the skin.  You have an ingrown nail.  You notice redness on your legs or feet.  You feel burning or tingling in your legs or feet.  You have pain or cramps in your legs and feet.  Your legs or feet are numb.  Your feet always feel cold. SEEK IMMEDIATE MEDICAL CARE IF:   There is increasing redness,   swelling, or pain in or around a wound.  There is a red line that goes up your leg.  Pus is coming from a wound.  You develop a fever or as directed by your health care provider.  You notice a bad smell coming from an ulcer or wound. Document Released: 05/18/2000 Document Revised: 01/21/2013 Document Reviewed: 10/28/2012 Exit[employer] Patient Information 2015 [employer]. This information is not intended to replace advice given to you by your health care provider. Make sure you discuss any questions you have with your health care provider.  

## 2015-02-02 LAB — CMP14+EGFR
ALK PHOS: 64 IU/L (ref 39–117)
ALT: 16 IU/L (ref 0–44)
AST: 18 IU/L (ref 0–40)
Albumin/Globulin Ratio: 1.9 (ref 1.1–2.5)
Albumin: 4.6 g/dL (ref 3.5–5.5)
BUN/Creatinine Ratio: 14 (ref 9–20)
BUN: 18 mg/dL (ref 6–24)
Bilirubin Total: 0.5 mg/dL (ref 0.0–1.2)
CO2: 27 mmol/L (ref 18–29)
CREATININE: 1.29 mg/dL — AB (ref 0.76–1.27)
Calcium: 10.1 mg/dL (ref 8.7–10.2)
Chloride: 94 mmol/L — ABNORMAL LOW (ref 97–108)
GFR calc Af Amer: 70 mL/min/{1.73_m2} (ref >59)
GFR calc non Af Amer: 61 mL/min/{1.73_m2} (ref >59)
GLOBULIN, TOTAL: 2.4 g/dL (ref 1.5–4.5)
GLUCOSE: 105 mg/dL — AB (ref 65–99)
Potassium: 4.2 mmol/L (ref 3.5–5.2)
Sodium: 139 mmol/L (ref 134–144)
Total Protein: 7 g/dL (ref 6.0–8.5)

## 2015-02-02 LAB — LIPID PANEL
CHOLESTEROL TOTAL: 165 mg/dL (ref 100–199)
Chol/HDL Ratio: 3.4 ratio units (ref 0.0–5.0)
HDL: 49 mg/dL (ref >39)
LDL CALC: 84 mg/dL (ref 0–99)
TRIGLYCERIDES: 162 mg/dL — AB (ref 0–149)
VLDL Cholesterol Cal: 32 mg/dL (ref 5–40)

## 2015-02-09 ENCOUNTER — Other Ambulatory Visit: Payer: Self-pay | Admitting: Nurse Practitioner

## 2015-05-10 ENCOUNTER — Encounter: Payer: Self-pay | Admitting: Nurse Practitioner

## 2015-05-10 ENCOUNTER — Ambulatory Visit (INDEPENDENT_AMBULATORY_CARE_PROVIDER_SITE_OTHER): Payer: BLUE CROSS/BLUE SHIELD | Admitting: Nurse Practitioner

## 2015-05-10 VITALS — BP 171/83 | HR 66 | Temp 96.9°F | Ht 74.0 in | Wt 175.2 lb

## 2015-05-10 DIAGNOSIS — Z1212 Encounter for screening for malignant neoplasm of rectum: Secondary | ICD-10-CM | POA: Diagnosis not present

## 2015-05-10 DIAGNOSIS — I10 Essential (primary) hypertension: Secondary | ICD-10-CM

## 2015-05-10 DIAGNOSIS — Z1159 Encounter for screening for other viral diseases: Secondary | ICD-10-CM | POA: Diagnosis not present

## 2015-05-10 DIAGNOSIS — E119 Type 2 diabetes mellitus without complications: Secondary | ICD-10-CM

## 2015-05-10 DIAGNOSIS — Z125 Encounter for screening for malignant neoplasm of prostate: Secondary | ICD-10-CM

## 2015-05-10 DIAGNOSIS — E785 Hyperlipidemia, unspecified: Secondary | ICD-10-CM

## 2015-05-10 LAB — POCT GLYCOSYLATED HEMOGLOBIN (HGB A1C): Hemoglobin A1C: 7.2

## 2015-05-10 MED ORDER — LISINOPRIL 10 MG PO TABS: ORAL_TABLET | ORAL | Status: DC

## 2015-05-10 MED ORDER — LISINOPRIL-HYDROCHLOROTHIAZIDE 20-25 MG PO TABS: 1.0000 | ORAL_TABLET | Freq: Every day | ORAL | Status: DC

## 2015-05-10 NOTE — Progress Notes (Signed)
Subjective:    Patient ID: Philip Richardson, male    DOB: Jun 28, 1955, 59 y.o.   MRN: [id number]  Patient here today for follow up of chronic medical problems.   Hypertension This is a chronic problem. The current episode started more than 1 year ago. The problem is unchanged. The problem is controlled. Pertinent negatives include no chest pain, headaches, neck pain, palpitations or shortness of breath. Risk factors for coronary artery disease include male gender, diabetes mellitus and dyslipidemia. Past treatments include ACE inhibitors. The current treatment provides moderate improvement. Compliance problems include diet and exercise.  There is no history of CAD/MI or CVA.  Diabetes He presents for his follow-up diabetic visit. He has type 2 diabetes mellitus. No MedicAlert identification noted. His disease course has been stable. There are no hypoglycemic associated symptoms. Pertinent negatives for hypoglycemia include no headaches. Pertinent negatives for diabetes include no chest pain, no polydipsia, no polyphagia, no polyuria and no visual change. There are no hypoglycemic complications. Symptoms are stable. There are no diabetic complications. Pertinent negatives for diabetic complications include no CVA. Risk factors for coronary artery disease include diabetes mellitus, dyslipidemia, hypertension, male sex and family history. Current diabetic treatment includes oral agent (dual therapy). He is compliant with treatment most of the time. His weight is stable. He is following a diabetic diet. When asked about meal planning, he reported none. He has not had a previous visit with a dietitian. He participates in exercise daily. His breakfast blood glucose is taken between 9-10 am. His breakfast blood glucose range is generally 130-140 mg/dl. An ACE inhibitor/angiotensin II receptor blocker is being taken. He does not see a podiatrist.Eye exam is current (April 2016).  Hyperlipidemia This is a chronic  problem. The current episode started more than 1 year ago. The problem is uncontrolled. Recent lipid tests were reviewed and are variable. Exacerbating diseases include diabetes. He has no history of hypothyroidism or obesity. Pertinent negatives include no chest pain or shortness of breath. Current antihyperlipidemic treatment includes statins. The current treatment provides moderate improvement of lipids. Compliance problems include adherence to diet.  Risk factors for coronary artery disease include diabetes mellitus, dyslipidemia, hypertension and male sex.      Review of Systems  Constitutional: Negative.   HENT: Negative.   Respiratory: Negative.  Negative for shortness of breath.   Cardiovascular: Negative.  Negative for chest pain and palpitations.  Endocrine: Negative for polydipsia, polyphagia and polyuria.  Musculoskeletal: Negative for neck pain.  Neurological: Negative for headaches.  Psychiatric/Behavioral: Negative.   All other systems reviewed and are negative.      Objective:   Physical Exam  Constitutional: He is oriented to person, place, and time. He appears well-developed and well-nourished.  HENT:  Head: Normocephalic.  Right Ear: External ear normal.  Left Ear: External ear normal.  Nose: Nose normal.  Mouth/Throat: Oropharynx is clear and moist.  Eyes: EOM are normal. Pupils are equal, round, and reactive to light.  Neck: Normal range of motion. Neck supple. No JVD present. No thyromegaly present.  Cardiovascular: Normal rate, regular rhythm, normal heart sounds and intact distal pulses.  Exam reveals no gallop and no friction rub.   No murmur heard. Pulmonary/Chest: Effort normal and breath sounds normal. No respiratory distress. He has no wheezes. He has no rales. He exhibits no tenderness.  Abdominal: Soft. Bowel sounds are normal. He exhibits no mass. There is no tenderness.  Genitourinary: Penis normal.  refuses rectal exam  Musculoskeletal: Normal  range  of motion. He exhibits no edema.  Lymphadenopathy:    He has no cervical adenopathy.  Neurological: He is alert and oriented to person, place, and time. No cranial nerve deficit.  Skin: Skin is warm and dry.  Psychiatric: He has a normal mood and affect. His behavior is normal. Judgment and thought content normal.    BP 171/83 mmHg  Pulse 66  Temp(Src) 96.9 F (36.1 C) (Oral)  Ht '6\' 2"'  (1.88 m)  Wt 175 lb 3.2 oz (79.47 kg)  BMI 22.48 kg/m2  Results for orders placed or performed in visit on 05/10/15  POCT glycosylated hemoglobin (Hb A1C)  Result Value Ref Range   Hemoglobin A1C 7.2         Assessment & Plan:  1. Essential hypertension Do not add salt to diet Changed from lisinopril 32m to lisinopril/hctz 20/25 Nurse check og blood pressure in 2 weeks' - CMP14+EGFR - lisinopril-hydrochlorothiazide (PRINZIDE,ZESTORETIC) 20-25 MG tablet; Take 1 tablet by mouth daily.  Dispense: 90 tablet; Refill: 1  2. Type 2 diabetes mellitus without complication, without long-term current use of insulin (HCC) Continue to watch carbs in diet - POCT glycosylated hemoglobin (Hb A1C)  3. Hyperlipidemia with target LDL less than 100 Low fat diet - Lipid panel  4. Need for hepatitis C screening test - Hepatitis C antibody  5. Screening for malignant neoplasm of the rectum  - Fecal occult blood, imunochemical; Future  6. Prostate cancer screening - PSA, total and free    Labs pending Health maintenance reviewed Diet and exercise encouraged Continue all meds Follow up  In 3 month   M[place]endale FNP

## 2015-05-10 NOTE — Patient Instructions (Signed)
Hypertension Hypertension, commonly called high blood pressure, is when the force of blood pumping through your arteries is too strong. Your arteries are the blood vessels that carry blood from your heart throughout your body. A blood pressure reading consists of a higher number over a lower number, such as 110/72. The higher number (systolic) is the pressure inside your arteries when your heart pumps. The lower number (diastolic) is the pressure inside your arteries when your heart relaxes. Ideally you want your blood pressure below 120/80. Hypertension forces your heart to work harder to pump blood. Your arteries may become narrow or stiff. Having untreated or uncontrolled hypertension can cause heart attack, stroke, kidney disease, and other problems. RISK FACTORS Some risk factors for high blood pressure are controllable. Others are not.  Risk factors you cannot control include:   Race. You may be at higher risk if you are African American.  Age. Risk increases with age.  Gender. Men are at higher risk than women before age 45 years. After age 65, women are at higher risk than men. Risk factors you can control include:  Not getting enough exercise or physical activity.  Being overweight.  Getting too much fat, sugar, calories, or salt in your diet.  Drinking too much alcohol. SIGNS AND SYMPTOMS Hypertension does not usually cause signs or symptoms. Extremely high blood pressure (hypertensive crisis) may cause headache, anxiety, shortness of breath, and nosebleed. DIAGNOSIS To check if you have hypertension, your health care provider will measure your blood pressure while you are seated, with your arm held at the level of your heart. It should be measured at least twice using the same arm. Certain conditions can cause a difference in blood pressure between your right and left arms. A blood pressure reading that is higher than normal on one occasion does not mean that you need treatment. If  it is not clear whether you have high blood pressure, you may be asked to return on a different day to have your blood pressure checked again. Or, you may be asked to monitor your blood pressure at home for 1 or more weeks. TREATMENT Treating high blood pressure includes making lifestyle changes and possibly taking medicine. Living a healthy lifestyle can help lower high blood pressure. You may need to change some of your habits. Lifestyle changes may include:  Following the DASH diet. This diet is high in fruits, vegetables, and whole grains. It is low in salt, red meat, and added sugars.  Keep your sodium intake below 2,300 mg per day.  Getting at least 30-45 minutes of aerobic exercise at least 4 times per week.  Losing weight if necessary.  Not smoking.  Limiting alcoholic beverages.  Learning ways to reduce stress. Your health care provider may prescribe medicine if lifestyle changes are not enough to get your blood pressure under control, and if one of the following is true:  You are 18-59 years of age and your systolic blood pressure is above 140.  You are 60 years of age or older, and your systolic blood pressure is above 150.  Your diastolic blood pressure is above 90.  You have diabetes, and your systolic blood pressure is over 140 or your diastolic blood pressure is over 90.  You have kidney disease and your blood pressure is above 140/90.  You have heart disease and your blood pressure is above 140/90. Your personal target blood pressure may vary depending on your medical conditions, your age, and other factors. HOME CARE INSTRUCTIONS    Have your blood pressure rechecked as directed by your health care provider.   Take medicines only as directed by your health care provider. Follow the directions carefully. Blood pressure medicines must be taken as prescribed. The medicine does not work as well when you skip doses. Skipping doses also puts you at risk for  problems.  Do not smoke.   Monitor your blood pressure at home as directed by your health care provider. SEEK MEDICAL CARE IF:   You think you are having a reaction to medicines taken.  You have recurrent headaches or feel dizzy.  You have swelling in your ankles.  You have trouble with your vision. SEEK IMMEDIATE MEDICAL CARE IF:  You develop a severe headache or confusion.  You have unusual weakness, numbness, or feel faint.  You have severe chest or abdominal pain.  You vomit repeatedly.  You have trouble breathing. MAKE SURE YOU:   Understand these instructions.  Will watch your condition.  Will get help right away if you are not doing well or get worse.   This information is not intended to replace advice given to you by your health care provider. Make sure you discuss any questions you have with your health care provider.   Document Released: 05/21/2005 Document Revised: 10/05/2014 Document Reviewed: 03/13/2013 Elsevier Interactive Patient Education 2016 Elsevier Inc[employer]  

## 2015-05-11 LAB — CMP14+EGFR
ALK PHOS: 90 IU/L (ref 39–117)
ALT: 16 IU/L (ref 0–44)
AST: 15 IU/L (ref 0–40)
Albumin/Globulin Ratio: 1.8 (ref 1.1–2.5)
Albumin: 4.3 g/dL (ref 3.5–5.5)
BILIRUBIN TOTAL: 0.4 mg/dL (ref 0.0–1.2)
BUN / CREAT RATIO: 14 (ref 9–20)
BUN: 15 mg/dL (ref 6–24)
CHLORIDE: 99 mmol/L (ref 97–106)
CO2: 28 mmol/L (ref 18–29)
Calcium: 9.5 mg/dL (ref 8.7–10.2)
Creatinine, Ser: 1.05 mg/dL (ref 0.76–1.27)
GFR calc Af Amer: 89 mL/min/{1.73_m2} (ref >59)
GFR calc non Af Amer: 77 mL/min/{1.73_m2} (ref >59)
GLUCOSE: 113 mg/dL — AB (ref 65–99)
Globulin, Total: 2.4 g/dL (ref 1.5–4.5)
Potassium: 4.8 mmol/L (ref 3.5–5.2)
Sodium: 142 mmol/L (ref 136–144)
Total Protein: 6.7 g/dL (ref 6.0–8.5)

## 2015-05-11 LAB — LIPID PANEL
CHOLESTEROL TOTAL: 185 mg/dL (ref 100–199)
Chol/HDL Ratio: 3.9 ratio units (ref 0.0–5.0)
HDL: 47 mg/dL (ref >39)
LDL Calculated: 106 mg/dL — ABNORMAL HIGH (ref 0–99)
TRIGLYCERIDES: 161 mg/dL — AB (ref 0–149)
VLDL CHOLESTEROL CAL: 32 mg/dL (ref 5–40)

## 2015-05-11 LAB — PSA, TOTAL AND FREE
PSA, Free Pct: 35 %
PSA, Free: 0.21 ng/mL
Prostate Specific Ag, Serum: 0.6 ng/mL (ref 0.0–4.0)

## 2015-05-11 LAB — HEPATITIS C ANTIBODY: Hep C Virus Ab: <0.1 s/co ratio (ref 0.0–0.9)

## 2015-05-12 ENCOUNTER — Other Ambulatory Visit: Payer: Self-pay | Admitting: Nurse Practitioner

## 2015-05-12 MED ORDER — ATORVASTATIN CALCIUM 20 MG PO TABS: 20.0000 mg | ORAL_TABLET | Freq: Every day | ORAL | Status: DC

## 2015-08-09 ENCOUNTER — Ambulatory Visit (INDEPENDENT_AMBULATORY_CARE_PROVIDER_SITE_OTHER): Payer: BLUE CROSS/BLUE SHIELD | Admitting: Nurse Practitioner

## 2015-08-09 ENCOUNTER — Telehealth: Payer: Self-pay | Admitting: Nurse Practitioner

## 2015-08-09 ENCOUNTER — Encounter: Payer: Self-pay | Admitting: Nurse Practitioner

## 2015-08-09 VITALS — BP 134/76 | HR 68 | Temp 97.9°F | Ht 74.0 in | Wt 174.0 lb

## 2015-08-09 DIAGNOSIS — I10 Essential (primary) hypertension: Secondary | ICD-10-CM | POA: Diagnosis not present

## 2015-08-09 DIAGNOSIS — E119 Type 2 diabetes mellitus without complications: Secondary | ICD-10-CM

## 2015-08-09 DIAGNOSIS — E785 Hyperlipidemia, unspecified: Secondary | ICD-10-CM

## 2015-08-09 LAB — BAYER DCA HB A1C WAIVED: HB A1C (BAYER DCA - WAIVED): 8.2 % — ABNORMAL HIGH (ref <7.0)

## 2015-08-09 MED ORDER — CANAGLIFLOZIN 100 MG PO TABS: 100.0000 mg | ORAL_TABLET | Freq: Every day | ORAL | Status: DC

## 2015-08-09 MED ORDER — ATORVASTATIN CALCIUM 20 MG PO TABS: 20.0000 mg | ORAL_TABLET | Freq: Every day | ORAL | Status: DC

## 2015-08-09 MED ORDER — LISINOPRIL-HYDROCHLOROTHIAZIDE 20-25 MG PO TABS: 1.0000 | ORAL_TABLET | Freq: Every day | ORAL | Status: DC

## 2015-08-09 NOTE — Telephone Encounter (Signed)
FYI-Pharmacy says that patient always refuses to get Lipitor

## 2015-08-09 NOTE — Progress Notes (Signed)
Subjective:    Patient ID: Philip Richardson, male    DOB: August 04, 1955, 60 y.o.   MRN: [id number]  Patient here today for follow up of chronic medical problems.  Outpatient Encounter Prescriptions as of 08/09/2015  Medication Sig  . atorvastatin (LIPITOR) 20 MG tablet Take 1 tablet (20 mg total) by mouth daily.  Marland Kitchen lisinopril-hydrochlorothiazide (PRINZIDE,ZESTORETIC) 20-25 MG tablet Take 1 tablet by mouth daily.  . metFORMIN (GLUCOPHAGE) 1000 MG tablet Take 1 tablet (1,000 mg total) by mouth 2 (two) times daily with a meal.  . ONETOUCH DELICA LANCETS 86L MISC    No facility-administered encounter medications on file as of 08/09/2015.     Hypertension This is a chronic problem. The current episode started more than 1 year ago. The problem is unchanged. The problem is controlled. Pertinent negatives include no chest pain, headaches, neck pain, palpitations or shortness of breath. Risk factors for coronary artery disease include male gender, diabetes mellitus and dyslipidemia. Past treatments include ACE inhibitors. The current treatment provides moderate improvement. Compliance problems include diet and exercise.  There is no history of CAD/MI or CVA.  Diabetes He presents for his follow-up diabetic visit. He has type 2 diabetes mellitus. No MedicAlert identification noted. His disease course has been stable. There are no hypoglycemic associated symptoms. Pertinent negatives for hypoglycemia include no headaches. Pertinent negatives for diabetes include no chest pain, no polydipsia, no polyphagia, no polyuria and no visual change. There are no hypoglycemic complications. Symptoms are stable. There are no diabetic complications. Pertinent negatives for diabetic complications include no CVA. Risk factors for coronary artery disease include diabetes mellitus, dyslipidemia, hypertension, male sex and family history. Current diabetic treatment includes oral agent (dual therapy). He is compliant with treatment  most of the time. His weight is stable. He is following a diabetic diet. When asked about meal planning, he reported none. He has not had a previous visit with a dietitian. He participates in exercise daily. His breakfast blood glucose is taken between 9-10 am. His breakfast blood glucose range is generally 180-200 mg/dl. His dinner blood glucose range is generally 180-200 mg/dl. His highest blood glucose is >200 mg/dl. An ACE inhibitor/angiotensin II receptor blocker is being taken. He does not see a podiatrist.Eye exam is current (April 2016).  Hyperlipidemia This is a chronic problem. The current episode started more than 1 year ago. The problem is uncontrolled. Recent lipid tests were reviewed and are variable. Exacerbating diseases include diabetes. He has no history of hypothyroidism or obesity. Pertinent negatives include no chest pain or shortness of breath. Current antihyperlipidemic treatment includes statins. The current treatment provides moderate improvement of lipids. Compliance problems include adherence to diet.  Risk factors for coronary artery disease include diabetes mellitus, dyslipidemia, hypertension and male sex.      Review of Systems  Constitutional: Negative.   HENT: Negative.   Respiratory: Negative.  Negative for shortness of breath.   Cardiovascular: Negative.  Negative for chest pain and palpitations.  Endocrine: Negative for polydipsia, polyphagia and polyuria.  Musculoskeletal: Negative for neck pain.  Neurological: Negative for headaches.  Psychiatric/Behavioral: Negative.   All other systems reviewed and are negative.      Objective:   Physical Exam  Constitutional: He is oriented to person, place, and time. He appears well-developed and well-nourished.  HENT:  Head: Normocephalic.  Right Ear: External ear normal.  Left Ear: External ear normal.  Nose: Nose normal.  Mouth/Throat: Oropharynx is clear and moist.  Eyes: EOM are normal. Pupils  are equal,  round, and reactive to light.  Neck: Normal range of motion. Neck supple. No JVD present. No thyromegaly present.  Cardiovascular: Normal rate, regular rhythm, normal heart sounds and intact distal pulses.  Exam reveals no gallop and no friction rub.   No murmur heard. Pulmonary/Chest: Effort normal and breath sounds normal. No respiratory distress. He has no wheezes. He has no rales. He exhibits no tenderness.  Abdominal: Soft. Bowel sounds are normal. He exhibits no mass. There is no tenderness.  Genitourinary: Penis normal.  refuses rectal exam  Musculoskeletal: Normal range of motion. He exhibits no edema.  Lymphadenopathy:    He has no cervical adenopathy.  Neurological: He is alert and oriented to person, place, and time. No cranial nerve deficit.  Skin: Skin is warm and dry.  Psychiatric: He has a normal mood and affect. His behavior is normal. Judgment and thought content normal.    BP 134/76 mmHg  Pulse 68  Temp(Src) 97.9 F (36.6 C) (Oral)  Ht '6\' 2"'  (1.88 m)  Wt 174 lb (78.926 kg)  BMI 22.33 kg/m2  HGBA1C- 8.2%      Assessment & Plan:  1. Essential hypertension Do not add salt to diet - CMP14+EGFR - lisinopril-hydrochlorothiazide (PRINZIDE,ZESTORETIC) 20-25 MG tablet; Take 1 tablet by mouth daily.  Dispense: 90 tablet; Refill: 1  2. Type 2 diabetes mellitus without complication, without long-term current use of insulin (HCC) Stricter carb counting Added invokana to meds - Bayer DCA Hb A1c Waived - canagliflozin (INVOKANA) 100 MG TABS tablet; Take 1 tablet (100 mg total) by mouth daily before breakfast.  Dispense: 30 tablet; Refill: 0  3. Hyperlipidemia with target LDL less than 100 Low fat diet - Lipid panel - atorvastatin (LIPITOR) 20 MG tablet; Take 1 tablet (20 mg total) by mouth daily.  Dispense: 90 tablet; Refill: 1    Labs pending Health maintenance reviewed Diet and exercise encouraged Continue all meds Follow up  In 3 months   [place],  FNP

## 2015-08-09 NOTE — Patient Instructions (Signed)
Diabetes and Foot Care Diabetes may cause you to have problems because of poor blood supply (circulation) to your feet and legs. This may cause the skin on your feet to become thinner, break easier, and heal more slowly. Your skin may become dry, and the skin may peel and crack. You may also have nerve damage in your legs and feet causing decreased feeling in them. You may not notice minor injuries to your feet that could lead to infections or more serious problems. Taking care of your feet is one of the most important things you can do for yourself.  HOME CARE INSTRUCTIONS  Wear shoes at all times, even in the house. Do not go barefoot. Bare feet are easily injured.  Check your feet daily for blisters, cuts, and redness. If you cannot see the bottom of your feet, use a mirror or ask someone for help.  Wash your feet with warm water (do not use hot water) and mild soap. Then pat your feet and the areas between your toes until they are completely dry. Do not soak your feet as this can dry your skin.  Apply a moisturizing lotion or petroleum jelly (that does not contain alcohol and is unscented) to the skin on your feet and to dry, brittle toenails. Do not apply lotion between your toes.  Trim your toenails straight across. Do not dig under them or around the cuticle. File the edges of your nails with an emery board or nail file.  Do not cut corns or calluses or try to remove them with medicine.  Wear clean socks or stockings every day. Make sure they are not too tight. Do not wear knee-high stockings since they may decrease blood flow to your legs.  Wear shoes that fit properly and have enough cushioning. To break in new shoes, wear them for just a few hours a day. This prevents you from injuring your feet. Always look in your shoes before you put them on to be sure there are no objects inside.  Do not cross your legs. This may decrease the blood flow to your feet.  If you find a minor scrape,  cut, or break in the skin on your feet, keep it and the skin around it clean and dry. These areas may be cleansed with mild soap and water. Do not cleanse the area with peroxide, alcohol, or iodine.  When you remove an adhesive bandage, be sure not to damage the skin around it.  If you have a wound, look at it several times a day to make sure it is healing.  Do not use heating pads or hot water bottles. They may burn your skin. If you have lost feeling in your feet or legs, you may not know it is happening until it is too late.  Make sure your health care provider performs a complete foot exam at least annually or more often if you have foot problems. Report any cuts, sores, or bruises to your health care provider immediately. SEEK MEDICAL CARE IF:   You have an injury that is not healing.  You have cuts or breaks in the skin.  You have an ingrown nail.  You notice redness on your legs or feet.  You feel burning or tingling in your legs or feet.  You have pain or cramps in your legs and feet.  Your legs or feet are numb.  Your feet always feel cold. SEEK IMMEDIATE MEDICAL CARE IF:   There is increasing redness,   swelling, or pain in or around a wound.  There is a red line that goes up your leg.  Pus is coming from a wound.  You develop a fever or as directed by your health care provider.  You notice a bad smell coming from an ulcer or wound.   This information is not intended to replace advice given to you by your health care provider. Make sure you discuss any questions you have with your health care provider.   Document Released: 05/18/2000 Document Revised: 01/21/2013 Document Reviewed: 10/28/2012 Elsevier Interactive Patient Education 2016 [employer] Inc[employer]  

## 2015-08-10 LAB — LIPID PANEL
CHOL/HDL RATIO: 3.9 ratio (ref 0.0–5.0)
Cholesterol, Total: 178 mg/dL (ref 100–199)
HDL: 46 mg/dL (ref >39)
LDL Calculated: 112 mg/dL — ABNORMAL HIGH (ref 0–99)
Triglycerides: 102 mg/dL (ref 0–149)
VLDL Cholesterol Cal: 20 mg/dL (ref 5–40)

## 2015-08-10 LAB — CMP14+EGFR
ALBUMIN: 4 g/dL (ref 3.5–5.5)
ALT: 19 IU/L (ref 0–44)
AST: 15 IU/L (ref 0–40)
Albumin/Globulin Ratio: 1.5 (ref 1.1–2.5)
Alkaline Phosphatase: 79 IU/L (ref 39–117)
BILIRUBIN TOTAL: 0.4 mg/dL (ref 0.0–1.2)
BUN/Creatinine Ratio: 16 (ref 9–20)
BUN: 18 mg/dL (ref 6–24)
CALCIUM: 9.6 mg/dL (ref 8.7–10.2)
CHLORIDE: 97 mmol/L (ref 96–106)
CO2: 26 mmol/L (ref 18–29)
Creatinine, Ser: 1.15 mg/dL (ref 0.76–1.27)
GFR, EST AFRICAN AMERICAN: 80 mL/min/{1.73_m2} (ref >59)
GFR, EST NON AFRICAN AMERICAN: 69 mL/min/{1.73_m2} (ref >59)
GLUCOSE: 224 mg/dL — AB (ref 65–99)
Globulin, Total: 2.7 g/dL (ref 1.5–4.5)
POTASSIUM: 5.5 mmol/L — AB (ref 3.5–5.2)
SODIUM: 138 mmol/L (ref 134–144)
TOTAL PROTEIN: 6.7 g/dL (ref 6.0–8.5)

## 2015-08-11 NOTE — Addendum Note (Signed)
Addended by: Jamelle Haring on: 08/11/2015 02:28 PM   Modules accepted: Orders

## 2015-08-23 LAB — HM DIABETES EYE EXAM

## 2015-11-29 ENCOUNTER — Ambulatory Visit (INDEPENDENT_AMBULATORY_CARE_PROVIDER_SITE_OTHER): Payer: BLUE CROSS/BLUE SHIELD | Admitting: Nurse Practitioner

## 2015-11-29 ENCOUNTER — Encounter: Payer: Self-pay | Admitting: Nurse Practitioner

## 2015-11-29 VITALS — BP 125/72 | HR 65 | Temp 97.1°F | Ht 74.0 in | Wt 174.6 lb

## 2015-11-29 DIAGNOSIS — E119 Type 2 diabetes mellitus without complications: Secondary | ICD-10-CM | POA: Diagnosis not present

## 2015-11-29 DIAGNOSIS — I10 Essential (primary) hypertension: Secondary | ICD-10-CM | POA: Diagnosis not present

## 2015-11-29 DIAGNOSIS — E785 Hyperlipidemia, unspecified: Secondary | ICD-10-CM | POA: Diagnosis not present

## 2015-11-29 LAB — CMP14+EGFR
ALK PHOS: 70 IU/L (ref 39–117)
ALT: 16 IU/L (ref 0–44)
AST: 16 IU/L (ref 0–40)
Albumin/Globulin Ratio: 1.7 (ref 1.2–2.2)
Albumin: 4.1 g/dL (ref 3.5–5.5)
BILIRUBIN TOTAL: 0.4 mg/dL (ref 0.0–1.2)
BUN / CREAT RATIO: 15 (ref 9–20)
BUN: 19 mg/dL (ref 6–24)
CHLORIDE: 95 mmol/L — AB (ref 96–106)
CO2: 24 mmol/L (ref 18–29)
Calcium: 9.7 mg/dL (ref 8.7–10.2)
Creatinine, Ser: 1.31 mg/dL — ABNORMAL HIGH (ref 0.76–1.27)
GFR calc Af Amer: 68 mL/min/{1.73_m2} (ref >59)
GFR calc non Af Amer: 59 mL/min/{1.73_m2} — ABNORMAL LOW (ref >59)
GLOBULIN, TOTAL: 2.4 g/dL (ref 1.5–4.5)
Glucose: 302 mg/dL — ABNORMAL HIGH (ref 65–99)
POTASSIUM: 4.6 mmol/L (ref 3.5–5.2)
SODIUM: 135 mmol/L (ref 134–144)
Total Protein: 6.5 g/dL (ref 6.0–8.5)

## 2015-11-29 LAB — LIPID PANEL
CHOLESTEROL TOTAL: 171 mg/dL (ref 100–199)
Chol/HDL Ratio: 4.2 ratio units (ref 0.0–5.0)
HDL: 41 mg/dL (ref >39)
LDL CALC: 101 mg/dL — AB (ref 0–99)
Triglycerides: 146 mg/dL (ref 0–149)
VLDL Cholesterol Cal: 29 mg/dL (ref 5–40)

## 2015-11-29 LAB — BAYER DCA HB A1C WAIVED: HB A1C (BAYER DCA - WAIVED): 8 % — ABNORMAL HIGH (ref <7.0)

## 2015-11-29 MED ORDER — ATORVASTATIN CALCIUM 20 MG PO TABS: 20.0000 mg | ORAL_TABLET | Freq: Every day | ORAL | Status: DC

## 2015-11-29 MED ORDER — METFORMIN HCL 1000 MG PO TABS: 1000.0000 mg | ORAL_TABLET | Freq: Two times a day (BID) | ORAL | Status: DC

## 2015-11-29 MED ORDER — LISINOPRIL-HYDROCHLOROTHIAZIDE 20-25 MG PO TABS: 1.0000 | ORAL_TABLET | Freq: Every day | ORAL | Status: DC

## 2015-11-29 MED ORDER — GLIMEPIRIDE 4 MG PO TABS: 4.0000 mg | ORAL_TABLET | Freq: Every day | ORAL | Status: DC

## 2015-11-29 NOTE — Patient Instructions (Signed)

## 2015-11-29 NOTE — Progress Notes (Signed)
Subjective:    Patient ID: Philip Richardson, male    DOB: 11/12/1955, 60 y.o.   MRN: [id number]  Patient here today for follow up of chronic medical problems.  Outpatient Encounter Prescriptions as of 11/29/2015  Medication Sig  . atorvastatin (LIPITOR) 20 MG tablet Take 1 tablet (20 mg total) by mouth daily.  . canagliflozin (INVOKANA) 100 MG TABS tablet Take 1 tablet (100 mg total) by mouth daily before breakfast.  . lisinopril-hydrochlorothiazide (PRINZIDE,ZESTORETIC) 20-25 MG tablet Take 1 tablet by mouth daily.  . metFORMIN (GLUCOPHAGE) 1000 MG tablet Take 1 tablet (1,000 mg total) by mouth 2 (two) times daily with a meal.  . ONETOUCH DELICA LANCETS 36U MISC    No facility-administered encounter medications on file as of 11/29/2015.    Hypertension This is a chronic problem. The current episode started more than 1 year ago. The problem is unchanged. The problem is controlled. Pertinent negatives include no chest pain, headaches, neck pain, palpitations or shortness of breath. Risk factors for coronary artery disease include male gender, diabetes mellitus and dyslipidemia. Past treatments include ACE inhibitors. The current treatment provides moderate improvement. Compliance problems include diet and exercise.  There is no history of CAD/MI or CVA.  Diabetes He presents for his follow-up diabetic visit. He has type 2 diabetes mellitus. No MedicAlert identification noted. His disease course has been stable. There are no hypoglycemic associated symptoms. Pertinent negatives for hypoglycemia include no headaches. Pertinent negatives for diabetes include no chest pain, no polydipsia, no polyphagia, no polyuria and no visual change. There are no hypoglycemic complications. Symptoms are stable. There are no diabetic complications. Pertinent negatives for diabetic complications include no CVA. Risk factors for coronary artery disease include diabetes mellitus, dyslipidemia, hypertension, male sex and  family history. Current diabetic treatment includes oral agent (dual therapy) and oral agent (monotherapy) (was put on invokana at last visit but he said it made him weak so he quit taking.). He is compliant with treatment most of the time. His weight is stable. He is following a diabetic diet. When asked about meal planning, he reported none. He has not had a previous visit with a dietitian. He participates in exercise daily. His breakfast blood glucose is taken between 9-10 am. His breakfast blood glucose range is generally 130-140 mg/dl. His dinner blood glucose range is generally 180-200 mg/dl. His highest blood glucose is >200 mg/dl. An ACE inhibitor/angiotensin II receptor blocker is being taken. He does not see a podiatrist.Eye exam is current (April 2016).  Hyperlipidemia This is a chronic problem. The current episode started more than 1 year ago. The problem is uncontrolled. Recent lipid tests were reviewed and are variable. Exacerbating diseases include diabetes. He has no history of hypothyroidism or obesity. Pertinent negatives include no chest pain or shortness of breath. Current antihyperlipidemic treatment includes statins. The current treatment provides moderate improvement of lipids. Compliance problems include adherence to diet.  Risk factors for coronary artery disease include diabetes mellitus, dyslipidemia, hypertension and male sex.      Review of Systems  Constitutional: Negative.   HENT: Negative.   Respiratory: Negative.  Negative for shortness of breath.   Cardiovascular: Negative.  Negative for chest pain and palpitations.  Endocrine: Negative for polydipsia, polyphagia and polyuria.  Musculoskeletal: Negative for neck pain.  Neurological: Negative for headaches.  Psychiatric/Behavioral: Negative.   All other systems reviewed and are negative.      Objective:   Physical Exam  Constitutional: He is oriented to person, place, and  time. He appears well-developed and  well-nourished.  HENT:  Head: Normocephalic.  Right Ear: External ear normal.  Left Ear: External ear normal.  Nose: Nose normal.  Mouth/Throat: Oropharynx is clear and moist.  Eyes: EOM are normal. Pupils are equal, round, and reactive to light.  Neck: Normal range of motion. Neck supple. No JVD present. No thyromegaly present.  Cardiovascular: Normal rate, regular rhythm, normal heart sounds and intact distal pulses.  Exam reveals no gallop and no friction rub.   No murmur heard. Pulmonary/Chest: Effort normal and breath sounds normal. No respiratory distress. He has no wheezes. He has no rales. He exhibits no tenderness.  Abdominal: Soft. Bowel sounds are normal. He exhibits no mass. There is no tenderness.  Genitourinary: Penis normal.  refuses rectal exam  Musculoskeletal: Normal range of motion. He exhibits no edema.  Lymphadenopathy:    He has no cervical adenopathy.  Neurological: He is alert and oriented to person, place, and time. No cranial nerve deficit.  Skin: Skin is warm and dry.  Psychiatric: He has a normal mood and affect. His behavior is normal. Judgment and thought content normal.    BP 125/72 mmHg  Pulse 65  Temp(Src) 97.1 F (36.2 C) (Oral)  Ht '6\' 2"'  (1.88 m)  Wt 174 lb 9.6 oz (79.198 kg)  BMI 22.41 kg/m2  HGBA1C- 8.0% - down from 8.2% at last visit      Assessment & Plan:  1. Hyperlipidemia with target LDL less than 100 Low fat diet - CMP14+EGFR - Lipid panel - atorvastatin (LIPITOR) 20 MG tablet; Take 1 tablet (20 mg total) by mouth daily.  Dispense: 90 tablet; Refill: 1  2. Essential hypertension Do not add salt o diet - CMP14+EGFR - lisinopril-hydrochlorothiazide (PRINZIDE,ZESTORETIC) 20-25 MG tablet; Take 1 tablet by mouth daily.  Dispense: 90 tablet; Refill: 1  3. Type 2 diabetes mellitus without complication, without long-term current use of insulin (HCC) Stricter carb counting Added amaryl- insurance would not pay for trajenta in past- may  revisit when new insurance starts next month Warned of low bllod sugars with amaryl - Bayer DCA Hb A1c Waived - CMP14+EGFR - metFORMIN (GLUCOPHAGE) 1000 MG tablet; Take 1 tablet (1,000 mg total) by mouth 2 (two) times daily with a meal.  Dispense: 180 tablet; Refill: 3 - glimepiride (AMARYL) 4 MG tablet; Take 1 tablet (4 mg total) by mouth daily before breakfast.  Dispense: 30 tablet; Refill: 3    Labs pending Health maintenance reviewed Diet and exercise encouraged Continue all meds Follow up  In 3 months   [place], FNP

## 2016-03-06 ENCOUNTER — Encounter: Payer: Self-pay | Admitting: Nurse Practitioner

## 2016-03-06 ENCOUNTER — Ambulatory Visit (INDEPENDENT_AMBULATORY_CARE_PROVIDER_SITE_OTHER): Payer: 59 | Admitting: Nurse Practitioner

## 2016-03-06 VITALS — BP 139/69 | HR 64 | Temp 97.0°F | Ht 74.0 in | Wt 174.0 lb

## 2016-03-06 DIAGNOSIS — I10 Essential (primary) hypertension: Secondary | ICD-10-CM

## 2016-03-06 DIAGNOSIS — E119 Type 2 diabetes mellitus without complications: Secondary | ICD-10-CM

## 2016-03-06 DIAGNOSIS — Z0000 Encounter for general adult medical examination without abnormal findings: Secondary | ICD-10-CM

## 2016-03-06 DIAGNOSIS — E785 Hyperlipidemia, unspecified: Secondary | ICD-10-CM

## 2016-03-06 LAB — CMP14+EGFR
ALBUMIN: 4.1 g/dL (ref 3.5–5.5)
ALT: 17 IU/L (ref 0–44)
AST: 16 IU/L (ref 0–40)
Albumin/Globulin Ratio: 1.6 (ref 1.2–2.2)
Alkaline Phosphatase: 71 IU/L (ref 39–117)
BILIRUBIN TOTAL: 0.4 mg/dL (ref 0.0–1.2)
BUN / CREAT RATIO: 14 (ref 9–20)
BUN: 20 mg/dL (ref 6–24)
CALCIUM: 9.3 mg/dL (ref 8.7–10.2)
CO2: 24 mmol/L (ref 18–29)
Chloride: 95 mmol/L — ABNORMAL LOW (ref 96–106)
Creatinine, Ser: 1.46 mg/dL — ABNORMAL HIGH (ref 0.76–1.27)
GFR, EST AFRICAN AMERICAN: 60 mL/min/{1.73_m2} (ref >59)
GFR, EST NON AFRICAN AMERICAN: 52 mL/min/{1.73_m2} — AB (ref >59)
GLUCOSE: 311 mg/dL — AB (ref 65–99)
Globulin, Total: 2.5 g/dL (ref 1.5–4.5)
Potassium: 5.5 mmol/L — ABNORMAL HIGH (ref 3.5–5.2)
Sodium: 135 mmol/L (ref 134–144)
TOTAL PROTEIN: 6.6 g/dL (ref 6.0–8.5)

## 2016-03-06 LAB — LIPID PANEL
CHOL/HDL RATIO: 4.2 ratio (ref 0.0–5.0)
Cholesterol, Total: 176 mg/dL (ref 100–199)
HDL: 42 mg/dL (ref >39)
LDL CALC: 105 mg/dL — AB (ref 0–99)
Triglycerides: 145 mg/dL (ref 0–149)
VLDL CHOLESTEROL CAL: 29 mg/dL (ref 5–40)

## 2016-03-06 LAB — BAYER DCA HB A1C WAIVED: HB A1C (BAYER DCA - WAIVED): 7.6 % — ABNORMAL HIGH (ref <7.0)

## 2016-03-06 NOTE — Progress Notes (Addendum)
Subjective:    Patient ID: Philip Richardson, male    DOB: 22-May-1956, 60 y.o.   MRN: [id number]  Patient here today for annual physical exam and follow up of chronic medical problems.  Outpatient Encounter Prescriptions as of 03/06/2016  Medication Sig  . lisinopril-hydrochlorothiazide (PRINZIDE,ZESTORETIC) 20-25 MG tablet Take 1 tablet by mouth daily.  . metFORMIN (GLUCOPHAGE) 1000 MG tablet Take 1 tablet (1,000 mg total) by mouth 2 (two) times daily with a meal.  . ONETOUCH DELICA LANCETS 29U MISC     Hypertension  This is a chronic problem. The current episode started more than 1 year ago. The problem is unchanged. The problem is controlled. Pertinent negatives include no chest pain, headaches, neck pain, palpitations or shortness of breath. Risk factors for coronary artery disease include male gender, diabetes mellitus and dyslipidemia. Past treatments include ACE inhibitors. The current treatment provides moderate improvement. Compliance problems include diet and exercise.  There is no history of CAD/MI or CVA.  Diabetes  He presents for his follow-up diabetic visit. He has type 2 diabetes mellitus. No MedicAlert identification noted. His disease course has been stable. There are no hypoglycemic associated symptoms. Pertinent negatives for hypoglycemia include no headaches. Pertinent negatives for diabetes include no chest pain, no polydipsia, no polyphagia, no polyuria and no visual change. There are no hypoglycemic complications. Symptoms are stable. There are no diabetic complications. Pertinent negatives for diabetic complications include no CVA. Risk factors for coronary artery disease include diabetes mellitus, dyslipidemia, hypertension, male sex and family history. Current diabetic treatment includes oral agent (dual therapy) (added amaryl last visit because insurance would not pay for tradgenta. He stopped amaryl because he said it made him weak.). He is compliant with treatment most of  the time. His weight is stable. He is following a diabetic diet. When asked about meal planning, he reported none. He has not had a previous visit with a dietitian. He participates in exercise daily. His breakfast blood glucose is taken between 9-10 am. His breakfast blood glucose range is generally 130-140 mg/dl. His dinner blood glucose range is generally 180-200 mg/dl. His highest blood glucose is >200 mg/dl. An ACE inhibitor/angiotensin II receptor blocker is being taken. He does not see a podiatrist.Eye exam is current (April 2016).  Hyperlipidemia  This is a chronic problem. The current episode started more than 1 year ago. The problem is uncontrolled. Recent lipid tests were reviewed and are variable. Exacerbating diseases include diabetes. He has no history of hypothyroidism or obesity. Pertinent negatives include no chest pain or shortness of breath. Treatments tried: patient stopped statin because he is ntaking cholesterof  OTC. The current treatment provides moderate improvement of lipids. Compliance problems include adherence to diet.  Risk factors for coronary artery disease include diabetes mellitus, dyslipidemia, hypertension and male sex.      Review of Systems  Constitutional: Negative.   HENT: Negative.   Respiratory: Negative.  Negative for shortness of breath.   Cardiovascular: Negative.  Negative for chest pain and palpitations.  Endocrine: Negative for polydipsia, polyphagia and polyuria.  Musculoskeletal: Negative for neck pain.  Neurological: Negative for headaches.  Psychiatric/Behavioral: Negative.   All other systems reviewed and are negative.      Objective:   Physical Exam  Constitutional: He is oriented to person, place, and time. He appears well-developed and well-nourished.  HENT:  Head: Normocephalic.  Right Ear: External ear normal.  Left Ear: External ear normal.  Nose: Nose normal.  Mouth/Throat: Oropharynx is clear and  moist.  Eyes: EOM are normal.  Pupils are equal, round, and reactive to light.  Neck: Normal range of motion. Neck supple. No JVD present. No thyromegaly present.  Cardiovascular: Normal rate, regular rhythm, normal heart sounds and intact distal pulses.  Exam reveals no gallop and no friction rub.   No murmur heard. Pulmonary/Chest: Effort normal and breath sounds normal. No respiratory distress. He has no wheezes. He has no rales. He exhibits no tenderness.  Abdominal: Soft. Bowel sounds are normal. He exhibits no mass. There is no tenderness.  Genitourinary: Penis normal.  Genitourinary Comments: refuses rectal exam  Musculoskeletal: Normal range of motion. He exhibits no edema.  Lymphadenopathy:    He has no cervical adenopathy.  Neurological: He is alert and oriented to person, place, and time. No cranial nerve deficit.  Skin: Skin is warm and dry.  Psychiatric: He has a normal mood and affect. His behavior is normal. Judgment and thought content normal.   BP 139/69   Pulse 64   Temp 97 F (36.1 C) (Oral)   Ht 6' 2" (1.88 m)   Wt 174 lb (78.9 kg)   BMI 22.34 kg/m   Hgba1c 7.6% - down from 8.0% at last visit     Assessment & Plan:  1. Annual physical exam  2. Hyperlipidemia with target LDL less than 100 Low fat diet - Lipid panel  3. Essential hypertension Do not add salt to diet - CMP14+EGFR  4. Type 2 diabetes mellitus without complication, without long-term current use of insulin (HCC) Stricter carb countinh- need hgba1c below 7 - Bayer DCA Hb A1c Waived - Microalbumin / creatinine urine ratio    Labs pending Health maintenance reviewed Diet and exercise encouraged Continue all meds Follow up  In 3 months   [place], FNP

## 2016-03-06 NOTE — Patient Instructions (Signed)

## 2016-03-07 LAB — MICROALBUMIN / CREATININE URINE RATIO
CREATININE, UR: 53.7 mg/dL
MICROALBUM., U, RANDOM: 13.1 ug/mL
Microalb/Creat Ratio: 24.4 mg/g creat (ref 0.0–30.0)

## 2016-03-09 ENCOUNTER — Other Ambulatory Visit: Payer: Self-pay | Admitting: *Deleted

## 2016-03-09 DIAGNOSIS — E875 Hyperkalemia: Secondary | ICD-10-CM

## 2016-06-12 ENCOUNTER — Ambulatory Visit: Payer: 59 | Admitting: Nurse Practitioner

## 2016-06-26 ENCOUNTER — Ambulatory Visit (INDEPENDENT_AMBULATORY_CARE_PROVIDER_SITE_OTHER): Payer: 59 | Admitting: Nurse Practitioner

## 2016-06-26 ENCOUNTER — Encounter (INDEPENDENT_AMBULATORY_CARE_PROVIDER_SITE_OTHER): Payer: Self-pay

## 2016-06-26 ENCOUNTER — Encounter: Payer: Self-pay | Admitting: Nurse Practitioner

## 2016-06-26 VITALS — BP 139/83 | HR 65 | Temp 97.7°F | Ht 74.0 in | Wt 177.0 lb

## 2016-06-26 DIAGNOSIS — E119 Type 2 diabetes mellitus without complications: Secondary | ICD-10-CM | POA: Diagnosis not present

## 2016-06-26 DIAGNOSIS — E785 Hyperlipidemia, unspecified: Secondary | ICD-10-CM

## 2016-06-26 DIAGNOSIS — Z1211 Encounter for screening for malignant neoplasm of colon: Secondary | ICD-10-CM | POA: Diagnosis not present

## 2016-06-26 DIAGNOSIS — Z1212 Encounter for screening for malignant neoplasm of rectum: Secondary | ICD-10-CM

## 2016-06-26 DIAGNOSIS — I10 Essential (primary) hypertension: Secondary | ICD-10-CM

## 2016-06-26 LAB — BAYER DCA HB A1C WAIVED: HB A1C: 7.2 % — AB (ref <7.0)

## 2016-06-26 MED ORDER — LISINOPRIL-HYDROCHLOROTHIAZIDE 20-25 MG PO TABS: 1.0000 | ORAL_TABLET | Freq: Every day | ORAL | 1 refills | Status: DC

## 2016-06-26 NOTE — Progress Notes (Signed)
Subjective:    Patient ID: Philip Richardson, male    DOB: 1955-06-20, 61 y.o.   MRN: [id number]  Patient here today for follow up of chronic medical problems.  Outpatient Encounter Prescriptions as of 06/26/2016  Medication Sig  . lisinopril-hydrochlorothiazide (PRINZIDE,ZESTORETIC) 20-25 MG tablet Take 1 tablet by mouth daily.  . metFORMIN (GLUCOPHAGE) 1000 MG tablet Take 1 tablet (1,000 mg total) by mouth 2 (two) times daily with a meal.  . ONETOUCH DELICA LANCETS 37T MISC    No facility-administered encounter medications on file as of 06/26/2016.     Hypertension  This is a chronic problem. The current episode started more than 1 year ago. The problem is unchanged. The problem is controlled. Pertinent negatives include no chest pain, headaches, neck pain, palpitations or shortness of breath. Risk factors for coronary artery disease include male gender, diabetes mellitus and dyslipidemia. Past treatments include ACE inhibitors. The current treatment provides moderate improvement. Compliance problems include diet and exercise.  There is no history of CAD/MI or CVA.  Diabetes  He presents for his follow-up diabetic visit. He has type 2 diabetes mellitus. No MedicAlert identification noted. His disease course has been stable. There are no hypoglycemic associated symptoms. Pertinent negatives for hypoglycemia include no headaches. Pertinent negatives for diabetes include no chest pain, no polydipsia, no polyphagia, no polyuria and no visual change. There are no hypoglycemic complications. Symptoms are stable. There are no diabetic complications. Pertinent negatives for diabetic complications include no CVA. Risk factors for coronary artery disease include diabetes mellitus, dyslipidemia, hypertension, male sex and family history. Current diabetic treatment includes oral agent (dual therapy) and oral agent (monotherapy) (was put on invokana at last visit but he said it made him weak so he quit taking.).  He is compliant with treatment most of the time. His weight is stable. He is following a diabetic diet. When asked about meal planning, he reported none. He has not had a previous visit with a dietitian. He participates in exercise daily. His breakfast blood glucose is taken between 9-10 am. His breakfast blood glucose range is generally 140-180 mg/dl. His dinner blood glucose range is generally 180-200 mg/dl. His highest blood glucose is >200 mg/dl. His overall blood glucose range is 140-180 mg/dl. (He has not been checking blood sugar everyday) An ACE inhibitor/angiotensin II receptor blocker is being taken. He does not see a podiatrist.Eye exam is current (April 2016).  Hyperlipidemia  This is a chronic problem. The current episode started more than 1 year ago. The problem is uncontrolled. Recent lipid tests were reviewed and are variable. Exacerbating diseases include diabetes. He has no history of hypothyroidism or obesity. Pertinent negatives include no chest pain or shortness of breath. Current antihyperlipidemic treatment includes statins. The current treatment provides moderate improvement of lipids. Compliance problems include adherence to diet.  Risk factors for coronary artery disease include diabetes mellitus, dyslipidemia, hypertension and male sex.      Review of Systems  Constitutional: Negative.   HENT: Negative.   Respiratory: Negative.  Negative for shortness of breath.   Cardiovascular: Negative.  Negative for chest pain and palpitations.  Endocrine: Negative for polydipsia, polyphagia and polyuria.  Musculoskeletal: Negative for neck pain.  Neurological: Negative for headaches.  Psychiatric/Behavioral: Negative.   All other systems reviewed and are negative.      Objective:   Physical Exam  Constitutional: He is oriented to person, place, and time. He appears well-developed and well-nourished.  HENT:  Head: Normocephalic.  Right Ear: External  ear normal.  Left Ear:  External ear normal.  Nose: Nose normal.  Mouth/Throat: Oropharynx is clear and moist.  Eyes: EOM are normal. Pupils are equal, round, and reactive to light.  Neck: Normal range of motion. Neck supple. No JVD present. No thyromegaly present.  Cardiovascular: Normal rate, regular rhythm, normal heart sounds and intact distal pulses.  Exam reveals no gallop and no friction rub.   No murmur heard. Pulmonary/Chest: Effort normal and breath sounds normal. No respiratory distress. He has no wheezes. He has no rales. He exhibits no tenderness.  Abdominal: Soft. Bowel sounds are normal. He exhibits no mass. There is no tenderness.  Genitourinary: Penis normal.  Genitourinary Comments: refuses rectal exam  Musculoskeletal: Normal range of motion. He exhibits no edema.  Lymphadenopathy:    He has no cervical adenopathy.  Neurological: He is alert and oriented to person, place, and time. No cranial nerve deficit.  Skin: Skin is warm and dry.  Psychiatric: He has a normal mood and affect. His behavior is normal. Judgment and thought content normal.    BP 139/83   Pulse 65   Temp 97.7 F (36.5 C) (Oral)   Ht 6' 2" (1.88 m)   Wt 177 lb (80.3 kg)   BMI 22.73 kg/m   HGBA1C- 7.2% down from 7.6% at last visit      Assessment & Plan:  1. Type 2 diabetes mellitus without complication, without long-term current use of insulin (HCC) Continue to watch carbs in diet - CMP14+EGFR - Lipid panel - Bayer DCA Hb A1c Waived  2. Essential hypertension Low sodium diet - CMP14+EGFR - Lipid panel - Bayer DCA Hb A1c Waived - lisinopril-hydrochlorothiazide (PRINZIDE,ZESTORETIC) 20-25 MG tablet; Take 1 tablet by mouth daily.  Dispense: 90 tablet; Refill: 1  3. Hyperlipidemia with target LDL less than 100 Low fat diet - CMP14+EGFR - Lipid panel - Bayer DCA Hb A1c Waived  4. Encounter for colorectal cancer screening hemoccult cards given to patient with directions - Fecal occult blood, imunochemical;  Future    Labs pending Health maintenance reviewed Diet and exercise encouraged Continue all meds Follow up  In 3 months   [place], FNP

## 2016-06-26 NOTE — Patient Instructions (Signed)

## 2016-06-27 LAB — CMP14+EGFR
ALT: 18 IU/L (ref 0–44)
AST: 14 IU/L (ref 0–40)
Albumin/Globulin Ratio: 1.8 (ref 1.2–2.2)
Albumin: 4.2 g/dL (ref 3.6–4.8)
Alkaline Phosphatase: 72 IU/L (ref 39–117)
BUN/Creatinine Ratio: 14 (ref 10–24)
BUN: 19 mg/dL (ref 8–27)
Bilirubin Total: 0.4 mg/dL (ref 0.0–1.2)
CALCIUM: 9.7 mg/dL (ref 8.6–10.2)
CO2: 28 mmol/L (ref 18–29)
Chloride: 98 mmol/L (ref 96–106)
Creatinine, Ser: 1.34 mg/dL — ABNORMAL HIGH (ref 0.76–1.27)
GFR calc Af Amer: 66 mL/min/{1.73_m2} (ref >59)
GFR, EST NON AFRICAN AMERICAN: 57 mL/min/{1.73_m2} — AB (ref >59)
GLOBULIN, TOTAL: 2.3 g/dL (ref 1.5–4.5)
Glucose: 284 mg/dL — ABNORMAL HIGH (ref 65–99)
Potassium: 4.8 mmol/L (ref 3.5–5.2)
Sodium: 139 mmol/L (ref 134–144)
TOTAL PROTEIN: 6.5 g/dL (ref 6.0–8.5)

## 2016-06-27 LAB — LIPID PANEL
CHOL/HDL RATIO: 4 ratio (ref 0.0–5.0)
Cholesterol, Total: 163 mg/dL (ref 100–199)
HDL: 41 mg/dL (ref >39)
LDL CALC: 95 mg/dL (ref 0–99)
TRIGLYCERIDES: 133 mg/dL (ref 0–149)
VLDL Cholesterol Cal: 27 mg/dL (ref 5–40)

## 2016-06-28 NOTE — Progress Notes (Signed)
Patient aware.

## 2016-08-21 LAB — HM DIABETES EYE EXAM

## 2016-09-25 ENCOUNTER — Encounter: Payer: Self-pay | Admitting: Nurse Practitioner

## 2016-09-25 ENCOUNTER — Ambulatory Visit (INDEPENDENT_AMBULATORY_CARE_PROVIDER_SITE_OTHER): Payer: 59 | Admitting: Nurse Practitioner

## 2016-09-25 VITALS — BP 149/79 | HR 67 | Temp 97.5°F | Ht 74.0 in | Wt 181.0 lb

## 2016-09-25 DIAGNOSIS — E785 Hyperlipidemia, unspecified: Secondary | ICD-10-CM | POA: Diagnosis not present

## 2016-09-25 DIAGNOSIS — I10 Essential (primary) hypertension: Secondary | ICD-10-CM

## 2016-09-25 DIAGNOSIS — E119 Type 2 diabetes mellitus without complications: Secondary | ICD-10-CM

## 2016-09-25 LAB — BAYER DCA HB A1C WAIVED: HB A1C: 8 % — AB (ref <7.0)

## 2016-09-25 MED ORDER — GLIMEPIRIDE 2 MG PO TABS: 2.0000 mg | ORAL_TABLET | Freq: Every day | ORAL | 5 refills | Status: DC

## 2016-09-25 NOTE — Progress Notes (Signed)
Subjective:    Patient ID: Philip Richardson, male    DOB: 10-01-1955, 61 y.o.   MRN: [id number]  HPI  Philip Richardson is here today for follow up of chronic medical problem.  Outpatient Encounter Prescriptions as of 09/25/2016  Medication Sig  . lisinopril-hydrochlorothiazide (PRINZIDE,ZESTORETIC) 20-25 MG tablet Take 1 tablet by mouth daily.  . metFORMIN (GLUCOPHAGE) 1000 MG tablet Take 1 tablet (1,000 mg total) by mouth 2 (two) times daily with a meal.  . ONETOUCH DELICA LANCETS 54Y MISC    No facility-administered encounter medications on file as of 09/25/2016.     1. Essential hypertension  No c/o chest pain, SOB or HA  2. Type 2 diabetes mellitus without complication, without long-term current use of insulin (HCC) last hgba1c 7.2% - blood sugars averaging 120-130- no hpoglycemia  3. Hyperlipidemia with target LDL less than 100  Does not watch diet- very little exercise    New complaints: None today     Review of Systems  Constitutional: Negative for diaphoresis.  Eyes: Negative for pain.  Respiratory: Negative for shortness of breath.   Cardiovascular: Negative for chest pain, palpitations and leg swelling.  Gastrointestinal: Negative for abdominal pain.  Endocrine: Negative for polydipsia.  Skin: Negative for rash.  Neurological: Negative for dizziness, weakness and headaches.  Hematological: Does not bruise/bleed easily.       Objective:   Physical Exam  Constitutional: He is oriented to person, place, and time. He appears well-developed and well-nourished.  HENT:  Head: Normocephalic.  Right Ear: External ear normal.  Left Ear: External ear normal.  Nose: Nose normal.  Mouth/Throat: Oropharynx is clear and moist.  Eyes: EOM are normal. Pupils are equal, round, and reactive to light.  Neck: Normal range of motion. Neck supple. No JVD present. No thyromegaly present.  Cardiovascular: Normal rate, regular rhythm, normal heart sounds and intact distal pulses.   Exam reveals no gallop and no friction rub.   No murmur heard. Pulmonary/Chest: Effort normal and breath sounds normal. No respiratory distress. He has no wheezes. He has no rales. He exhibits no tenderness.  Abdominal: Soft. Bowel sounds are normal. He exhibits no mass. There is no tenderness.  Genitourinary: Prostate normal and penis normal.  Musculoskeletal: Normal range of motion. He exhibits no edema.  Lymphadenopathy:    He has no cervical adenopathy.  Neurological: He is alert and oriented to person, place, and time. No cranial nerve deficit.  Skin: Skin is warm and dry.  Psychiatric: He has a normal mood and affect. His behavior is normal. Judgment and thought content normal.   BP (!) 149/79   Pulse 67   Temp 97.5 F (36.4 C) (Oral)   Ht '6\' 2"'  (1.88 m)   Wt 181 lb (82.1 kg)   BMI 23.24 kg/m   hgba1c- 8.0%      Assessment & Plan:  1. Essential hypertension Low sodium diet - CMP14+EGFR  2. Type 2 diabetes mellitus without complication, without long-term current use of insulin (HCC) Check fasting blood sugars every morning Strict carb counting Added amaryl Meds ordered this encounter  Medications  . glimepiride (AMARYL) 2 MG tablet    Sig: Take 1 tablet (2 mg total) by mouth daily with breakfast.    Dispense:  30 tablet    Refill:  5    Order Specific Question:   Supervising Provider    Answer:   VINCENT, CAROL L [[id number]]   - Bayer DCA Hb A1c Waived  3. Hyperlipidemia with target  LDL less than 100 low fat diet - Lipid panel    Labs pending Health maintenance reviewed Diet and exercise encouraged Continue all meds Follow up  In 3 months   [place], FNP

## 2016-09-25 NOTE — Patient Instructions (Signed)
Carbohydrate Counting for Diabetes Mellitus, Adult Carbohydrate counting is a method for keeping track of how many carbohydrates you eat. Eating carbohydrates naturally increases the amount of sugar (glucose) in the blood. Counting how many carbohydrates you eat helps keep your blood glucose within normal limits, which helps you manage your diabetes (diabetes mellitus). It is important to know how many carbohydrates you can safely have in each meal. This is different for every person. A diet and nutrition specialist (registered dietitian) can help you make a meal plan and calculate how many carbohydrates you should have at each meal and snack. Carbohydrates are found in the following foods:  Grains, such as breads and cereals.  Dried beans and soy products.  Starchy vegetables, such as potatoes, peas, and corn.  Fruit and fruit juices.  Milk and yogurt.  Sweets and snack foods, such as cake, cookies, candy, chips, and soft drinks. How do I count carbohydrates? There are two ways to count carbohydrates in food. You can use either of the methods or a combination of both. Reading "Nutrition Facts" on packaged food  The "Nutrition Facts" list is included on the labels of almost all packaged foods and beverages in the [place] It includes:  The serving size.  Information about nutrients in each serving, including the grams (g) of carbohydrate per serving. To use the "Nutrition Facts":  Decide how many servings you will have.  Multiply the number of servings by the number of carbohydrates per serving.  The resulting number is the total amount of carbohydrates that you will be having. Learning standard serving sizes of other foods  When you eat foods containing carbohydrates that are not packaged or do not include "Nutrition Facts" on the label, you need to measure the servings in order to count the amount of carbohydrates:  Measure the foods that you will eat with a food scale or measuring  cup, if needed.  Decide how many standard-size servings you will eat.  Multiply the number of servings by 15. Most carbohydrate-rich foods have about 15 g of carbohydrates per serving.  For example, if you eat 8 oz (170 g) of strawberries, you will have eaten 2 servings and 30 g of carbohydrates (2 servings x 15 g = 30 g).  For foods that have more than one food mixed, such as soups and casseroles, you must count the carbohydrates in each food that is included. The following list contains standard serving sizes of common carbohydrate-rich foods. Each of these servings has about 15 g of carbohydrates:   hamburger bun or  English muffin.   oz (15 mL) syrup.   oz (14 g) jelly.  1 slice of bread.  1 six-inch tortilla.  3 oz (85 g) cooked rice or pasta.  4 oz (113 g) cooked dried beans.  4 oz (113 g) starchy vegetable, such as peas, corn, or potatoes.  4 oz (113 g) hot cereal.  4 oz (113 g) mashed potatoes or  of a large baked potato.  4 oz (113 g) canned or frozen fruit.  4 oz (120 mL) fruit juice.  4-6 crackers.  6 chicken nuggets.  6 oz (170 g) unsweetened dry cereal.  6 oz (170 g) plain fat-free yogurt or yogurt sweetened with artificial sweeteners.  8 oz (240 mL) milk.  8 oz (170 g) fresh fruit or one small piece of fruit.  24 oz (680 g) popped popcorn. Example of carbohydrate counting Sample meal  3 oz (85 g) chicken breast.  6 oz (  170 g) brown rice.  4 oz (113 g) corn.  8 oz (240 mL) milk.  8 oz (170 g) strawberries with sugar-free whipped topping. Carbohydrate calculation 1. Identify the foods that contain carbohydrates:  Rice.  Corn.  Milk.  Strawberries. 2. Calculate how many servings you have of each food:  2 servings rice.  1 serving corn.  1 serving milk.  1 serving strawberries. 3. Multiply each number of servings by 15 g:  2 servings rice x 15 g = 30 g.  1 serving corn x 15 g = 15 g.  1 serving milk x 15 g = 15  g.  1 serving strawberries x 15 g = 15 g. 4. Add together all of the amounts to find the total grams of carbohydrates eaten:  30 g + 15 g + 15 g + 15 g = 75 g of carbohydrates total. This information is not intended to replace advice given to you by your health care provider. Make sure you discuss any questions you have with your health care provider. Document Released: 05/21/2005 Document Revised: 12/09/2015 Document Reviewed: 11/02/2015 Elsevier Interactive Patient Education  2017 Elsevier Inc.  

## 2016-09-26 LAB — CMP14+EGFR
ALT: 19 IU/L (ref 0–44)
AST: 18 IU/L (ref 0–40)
Albumin/Globulin Ratio: 1.8 (ref 1.2–2.2)
Albumin: 4.2 g/dL (ref 3.6–4.8)
Alkaline Phosphatase: 68 IU/L (ref 39–117)
BILIRUBIN TOTAL: 0.2 mg/dL (ref 0.0–1.2)
BUN / CREAT RATIO: 15 (ref 10–24)
BUN: 19 mg/dL (ref 8–27)
CO2: 27 mmol/L (ref 18–29)
CREATININE: 1.3 mg/dL — AB (ref 0.76–1.27)
Calcium: 9.5 mg/dL (ref 8.6–10.2)
Chloride: 96 mmol/L (ref 96–106)
GFR calc non Af Amer: 59 mL/min/{1.73_m2} — ABNORMAL LOW (ref >59)
GFR, EST AFRICAN AMERICAN: 69 mL/min/{1.73_m2} (ref >59)
GLUCOSE: 186 mg/dL — AB (ref 65–99)
Globulin, Total: 2.3 g/dL (ref 1.5–4.5)
Potassium: 4.3 mmol/L (ref 3.5–5.2)
SODIUM: 139 mmol/L (ref 134–144)
TOTAL PROTEIN: 6.5 g/dL (ref 6.0–8.5)

## 2016-09-26 LAB — LIPID PANEL
Chol/HDL Ratio: 4.1 ratio (ref 0.0–5.0)
Cholesterol, Total: 174 mg/dL (ref 100–199)
HDL: 42 mg/dL (ref >39)
LDL CALC: 99 mg/dL (ref 0–99)
Triglycerides: 167 mg/dL — ABNORMAL HIGH (ref 0–149)
VLDL CHOLESTEROL CAL: 33 mg/dL (ref 5–40)

## 2016-10-23 ENCOUNTER — Other Ambulatory Visit: Payer: 59

## 2016-10-23 DIAGNOSIS — Z1212 Encounter for screening for malignant neoplasm of rectum: Principal | ICD-10-CM

## 2016-10-23 DIAGNOSIS — Z1211 Encounter for screening for malignant neoplasm of colon: Secondary | ICD-10-CM

## 2016-10-24 LAB — FECAL OCCULT BLOOD, IMMUNOCHEMICAL: FECAL OCCULT BLD: NEGATIVE

## 2016-12-08 ENCOUNTER — Other Ambulatory Visit: Payer: Self-pay | Admitting: Nurse Practitioner

## 2016-12-08 DIAGNOSIS — E119 Type 2 diabetes mellitus without complications: Secondary | ICD-10-CM

## 2016-12-25 ENCOUNTER — Encounter: Payer: Self-pay | Admitting: Nurse Practitioner

## 2016-12-25 ENCOUNTER — Ambulatory Visit (INDEPENDENT_AMBULATORY_CARE_PROVIDER_SITE_OTHER): Payer: 59 | Admitting: Nurse Practitioner

## 2016-12-25 VITALS — BP 161/83 | HR 70 | Temp 97.8°F | Ht 74.0 in | Wt 185.0 lb

## 2016-12-25 DIAGNOSIS — I10 Essential (primary) hypertension: Secondary | ICD-10-CM

## 2016-12-25 DIAGNOSIS — E785 Hyperlipidemia, unspecified: Secondary | ICD-10-CM

## 2016-12-25 DIAGNOSIS — E119 Type 2 diabetes mellitus without complications: Secondary | ICD-10-CM | POA: Diagnosis not present

## 2016-12-25 MED ORDER — LISINOPRIL-HYDROCHLOROTHIAZIDE 20-25 MG PO TABS: 1.0000 | ORAL_TABLET | Freq: Every day | ORAL | 1 refills | Status: DC

## 2016-12-25 MED ORDER — METFORMIN HCL 1000 MG PO TABS: 1000.0000 mg | ORAL_TABLET | Freq: Two times a day (BID) | ORAL | 0 refills | Status: DC

## 2016-12-25 MED ORDER — LISINOPRIL-HYDROCHLOROTHIAZIDE 20-12.5 MG PO TABS: 2.0000 | ORAL_TABLET | Freq: Every day | ORAL | 1 refills | Status: DC

## 2016-12-25 MED ORDER — GLIMEPIRIDE 2 MG PO TABS: 2.0000 mg | ORAL_TABLET | Freq: Every day | ORAL | 5 refills | Status: DC

## 2016-12-25 NOTE — Progress Notes (Signed)
 Subjective:    Patient ID: Philip Richardson, male    DOB: 02/16/1956, 60 y.o.   MRN: [id number]  HPI  Philip Richardson is here today for follow up of chronic medical problem.  Outpatient Encounter Prescriptions as of 12/25/2016  Medication Sig  . glimepiride (AMARYL) 2 MG tablet Take 1 tablet (2 mg total) by mouth daily with breakfast.  . lisinopril-hydrochlorothiazide (PRINZIDE,ZESTORETIC) 20-25 MG tablet Take 1 tablet by mouth daily.  . metFORMIN (GLUCOPHAGE) 1000 MG tablet Take 1 tablet (1,000 mg total) by mouth 2 (two) times daily with a meal.  . ONETOUCH DELICA LANCETS 33G MISC      1. Type 2 diabetes mellitus without complication, without long-term current use of insulin (HCC) last hgba1c 8.0%. We added amaryl to meds. Blood sugars have improved- he does not check everyday. No hypoglycemia  2. Essential hypertension  No c/o chest pain, SOB or Headache. Does not check blood pressure at home  3. Hyperlipidemia with target LDL less than 100  Does not really watch fats in diet very closely. Is currently not on anything for cholesterol.    New complaints: None today  Social history:     Review of Systems  Constitutional: Negative for activity change and appetite change.  HENT: Negative.   Eyes: Negative for pain.  Respiratory: Negative for shortness of breath.   Cardiovascular: Negative for chest pain, palpitations and leg swelling.  Gastrointestinal: Negative for abdominal pain.  Endocrine: Negative for polydipsia.  Genitourinary: Negative.   Skin: Negative for rash.  Neurological: Negative for dizziness, weakness and headaches.  Hematological: Does not bruise/bleed easily.  Psychiatric/Behavioral: Negative.   All other systems reviewed and are negative.      Objective:   Physical Exam  Constitutional: He is oriented to person, place, and time. He appears well-developed and well-nourished.  HENT:  Head: Normocephalic.  Right Ear: External ear normal.  Left Ear:  External ear normal.  Nose: Nose normal.  Mouth/Throat: Oropharynx is clear and moist.  Eyes: Pupils are equal, round, and reactive to light. EOM are normal.  Neck: Normal range of motion. Neck supple. No JVD present. No thyromegaly present.  Cardiovascular: Normal rate, regular rhythm, normal heart sounds and intact distal pulses.  Exam reveals no gallop and no friction rub.   No murmur heard. Pulmonary/Chest: Effort normal and breath sounds normal. No respiratory distress. He has no wheezes. He has no rales. He exhibits no tenderness.  Abdominal: Soft. Bowel sounds are normal. He exhibits no mass. There is no tenderness.  Musculoskeletal: Normal range of motion. He exhibits no edema.  Lymphadenopathy:    He has no cervical adenopathy.  Neurological: He is alert and oriented to person, place, and time. No cranial nerve deficit.  Skin: Skin is warm and dry.  Psychiatric: He has a normal mood and affect. His behavior is normal. Judgment and thought content normal.    BP (!) 161/83   Pulse 70   Temp 97.8 F (36.6 C) (Oral)   Ht 6' 2" (1.88 m)   Wt 185 lb (83.9 kg)   BMI 23.75 kg/m   hgba1c 7.1%      Assessment & Plan:  1. Type 2 diabetes mellitus without complication, without long-term current use of insulin (HCC) Continue to watch carbs in diet - Bayer DCA Hb A1c Waived - glimepiride (AMARYL) 2 MG tablet; Take 1 tablet (2 mg total) by mouth daily with breakfast.  Dispense: 30 tablet; Refill: 5 - metFORMIN (GLUCOPHAGE) 1000 MG tablet; Take   1 tablet (1,000 mg total) by mouth 2 (two) times daily with a meal.  Dispense: 60 tablet; Refill: 0  2. Essential hypertension Low sodium diet - CMP14+EGFR Changed lisinopril from 20/25 to 20/12.5 2 daily Keep diaryu of blood pressure if can - lisinopril-hydrochlorothiazide (ZESTORETIC) 20-12.5 MG tablet; Take 2 tablets by mouth daily.  Dispense: 180 tablet; Refill: 1  3. Hyperlipidemia with target LDL less than 100 Low fat diet - Lipid  panel    Labs pending Health maintenance reviewed Diet and exercise encouraged Continue all meds Follow up  In 3 months   [place], FNP

## 2016-12-25 NOTE — Patient Instructions (Signed)

## 2016-12-26 ENCOUNTER — Telehealth: Payer: Self-pay | Admitting: Nurse Practitioner

## 2016-12-26 LAB — LIPID PANEL
CHOLESTEROL TOTAL: 185 mg/dL (ref 100–199)
Chol/HDL Ratio: 4.6 ratio (ref 0.0–5.0)
HDL: 40 mg/dL (ref >39)
LDL Calculated: 102 mg/dL — ABNORMAL HIGH (ref 0–99)
TRIGLYCERIDES: 215 mg/dL — AB (ref 0–149)
VLDL Cholesterol Cal: 43 mg/dL — ABNORMAL HIGH (ref 5–40)

## 2016-12-26 LAB — CMP14+EGFR
ALBUMIN: 4.3 g/dL (ref 3.6–4.8)
ALK PHOS: 67 IU/L (ref 39–117)
ALT: 29 IU/L (ref 0–44)
AST: 23 IU/L (ref 0–40)
Albumin/Globulin Ratio: 1.8 (ref 1.2–2.2)
BILIRUBIN TOTAL: 0.3 mg/dL (ref 0.0–1.2)
BUN/Creatinine Ratio: 15 (ref 10–24)
BUN: 22 mg/dL (ref 8–27)
CHLORIDE: 101 mmol/L (ref 96–106)
CO2: 26 mmol/L (ref 20–29)
CREATININE: 1.44 mg/dL — AB (ref 0.76–1.27)
Calcium: 10 mg/dL (ref 8.6–10.2)
GFR calc Af Amer: 61 mL/min/{1.73_m2} (ref >59)
GFR calc non Af Amer: 52 mL/min/{1.73_m2} — ABNORMAL LOW (ref >59)
GLOBULIN, TOTAL: 2.4 g/dL (ref 1.5–4.5)
GLUCOSE: 78 mg/dL (ref 65–99)
Potassium: 4.1 mmol/L (ref 3.5–5.2)
SODIUM: 144 mmol/L (ref 134–144)
Total Protein: 6.7 g/dL (ref 6.0–8.5)

## 2016-12-26 LAB — BAYER DCA HB A1C WAIVED: HB A1C (BAYER DCA - WAIVED): 7.1 % — ABNORMAL HIGH (ref <7.0)

## 2016-12-26 NOTE — Telephone Encounter (Signed)
Provider, dose of medication was increased to two pills daily and has been one pill prior to office visit.  Is two pills daily correct?

## 2016-12-27 NOTE — Telephone Encounter (Signed)
Yes he is on 2 tablets daily now as discussed at appointment- changed form lisinopril 20/25 1x a day to 20/12.5 2 pills daily to increase lisinopril from 20mg  daily to 40mg  and keep HCTZ at 25mg  daily

## 2016-12-27 NOTE — Telephone Encounter (Signed)
Clarified with Philip Richardson pt to be on Lisinopril-HCTZ 20/12.5 two tablets daily equal to 40/25

## 2017-02-05 ENCOUNTER — Other Ambulatory Visit: Payer: Self-pay | Admitting: Nurse Practitioner

## 2017-02-05 DIAGNOSIS — E119 Type 2 diabetes mellitus without complications: Secondary | ICD-10-CM

## 2017-03-08 ENCOUNTER — Other Ambulatory Visit: Payer: Self-pay | Admitting: Nurse Practitioner

## 2017-03-08 DIAGNOSIS — E119 Type 2 diabetes mellitus without complications: Secondary | ICD-10-CM

## 2017-04-02 ENCOUNTER — Encounter: Payer: Self-pay | Admitting: Nurse Practitioner

## 2017-04-02 ENCOUNTER — Ambulatory Visit (INDEPENDENT_AMBULATORY_CARE_PROVIDER_SITE_OTHER): Payer: 59 | Admitting: Nurse Practitioner

## 2017-04-02 VITALS — BP 145/80 | HR 67 | Temp 97.0°F | Ht 74.0 in | Wt 187.0 lb

## 2017-04-02 DIAGNOSIS — E785 Hyperlipidemia, unspecified: Secondary | ICD-10-CM

## 2017-04-02 DIAGNOSIS — E119 Type 2 diabetes mellitus without complications: Secondary | ICD-10-CM

## 2017-04-02 DIAGNOSIS — I10 Essential (primary) hypertension: Secondary | ICD-10-CM

## 2017-04-02 MED ORDER — LISINOPRIL-HYDROCHLOROTHIAZIDE 20-12.5 MG PO TABS: 2.0000 | ORAL_TABLET | Freq: Every day | ORAL | 1 refills | Status: DC

## 2017-04-02 MED ORDER — METFORMIN HCL 1000 MG PO TABS: 1000.0000 mg | ORAL_TABLET | Freq: Two times a day (BID) | ORAL | 1 refills | Status: DC

## 2017-04-02 MED ORDER — GLIMEPIRIDE 2 MG PO TABS: 2.0000 mg | ORAL_TABLET | Freq: Every day | ORAL | 1 refills | Status: DC

## 2017-04-02 NOTE — Patient Instructions (Signed)

## 2017-04-02 NOTE — Progress Notes (Signed)
Subjective:    Patient ID: Philip Richardson, male    DOB: 02-Apr-1956, 61 y.o.   MRN: [id number]  HPI Philip Richardson is here today for follow up of chronic medical problem.  Outpatient Encounter Prescriptions as of 04/02/2017  Medication Sig  . glimepiride (AMARYL) 2 MG tablet Take 1 tablet (2 mg total) by mouth daily with breakfast.  . lisinopril-hydrochlorothiazide (ZESTORETIC) 20-12.5 MG tablet Take 2 tablets by mouth daily.  . metFORMIN (GLUCOPHAGE) 1000 MG tablet TAKE 1 TABLET TWICE A DAY WITH MEALS  . ONETOUCH DELICA LANCETS 77O MISC    No facility-administered encounter medications on file as of 04/02/2017.     1. Type 2 diabetes mellitus without complication, without long-term current use of insulin (HCC)  Blood glucose controlled with metformin and glimeperide.  Patient checks blood glucose twice weekly and he states it runs in the 130s.  Previous A1C 7.1 on 12/25/16.  2. Essential hypertension  Blood pressure well-controlled on combination lisinopril-HCTZ.  Patient does not check blood pressure regularly at home.  3. Hyperlipidemia with target LDL less than 100  Managed with diet and exercise.  Labs monitored for progression.    New complaints: None today.  Social history: Lives alone works 2 jobs to pay for anew [employer] he just bought.   Review of Systems  Constitutional: Positive for activity change (increased activity; walking daily). Negative for appetite change.  Respiratory: Negative for cough and shortness of breath.   Cardiovascular: Negative for chest pain and palpitations.  Neurological: Negative for dizziness, light-headedness and headaches.  All other systems reviewed and are negative.      Objective:   Physical Exam  Constitutional: He is oriented to person, place, and time. He appears well-developed and well-nourished. No distress.  HENT:  Head: Normocephalic.  Right Ear: External ear normal.  Left Ear: External ear normal.  Mouth/Throat:  Oropharynx is clear and moist.  Eyes: Pupils are equal, round, and reactive to light.  Neck: Normal range of motion. Neck supple. No thyromegaly present.  Cardiovascular: Normal rate, regular rhythm, normal heart sounds and intact distal pulses.   No murmur heard. Pulmonary/Chest: Effort normal and breath sounds normal. No respiratory distress. He has no wheezes.  Abdominal: Soft. Bowel sounds are normal. There is no tenderness.  Musculoskeletal: Normal range of motion.  Neurological: He is alert and oriented to person, place, and time.  Skin: Skin is warm and dry.  Psychiatric: He has a normal mood and affect. His behavior is normal.   BP (!) 162/83   Pulse 67   Temp (!) 97 F (36.1 C) (Oral)   Ht '6\' 2"'$  (1.88 m)   Wt 187 lb (84.8 kg)   BMI 24.01 kg/m  A1C: 6.9%    Assessment & Plan:  1. Type 2 diabetes mellitus without complication, without long-term current use of insulin (HCC) Continue to watch carbs in diet - Bayer DCA Hb A1c Waived - Microalbumin / creatinine urine ratio - glimepiride (AMARYL) 2 MG tablet; Take 1 tablet (2 mg total) by mouth daily with breakfast.  Dispense: 90 tablet; Refill: 1 - metFORMIN (GLUCOPHAGE) 1000 MG tablet; Take 1 tablet (1,000 mg total) by mouth 2 (two) times daily with a meal.  Dispense: 180 tablet; Refill: 1  2. Essential hypertension Low sodium diet - CMP14+EGFR - lisinopril-hydrochlorothiazide (ZESTORETIC) 20-12.5 MG tablet; Take 2 tablets by mouth daily.  Dispense: 180 tablet; Refill: 1  3. Hyperlipidemia with target LDL less than 100 Low fat diet - Lipid panel  Labs pending Health maintenance reviewed Diet and exercise encouraged Continue all meds Follow up  In 3 months   [place], FNP

## 2017-04-03 LAB — LIPID PANEL
CHOLESTEROL TOTAL: 176 mg/dL (ref 100–199)
Chol/HDL Ratio: 4.2 ratio (ref 0.0–5.0)
HDL: 42 mg/dL (ref >39)
LDL CALC: 110 mg/dL — AB (ref 0–99)
TRIGLYCERIDES: 119 mg/dL (ref 0–149)
VLDL Cholesterol Cal: 24 mg/dL (ref 5–40)

## 2017-04-03 LAB — CMP14+EGFR
ALK PHOS: 70 IU/L (ref 39–117)
ALT: 24 IU/L (ref 0–44)
AST: 19 IU/L (ref 0–40)
Albumin/Globulin Ratio: 1.9 (ref 1.2–2.2)
Albumin: 4.2 g/dL (ref 3.6–4.8)
BILIRUBIN TOTAL: 0.2 mg/dL (ref 0.0–1.2)
BUN/Creatinine Ratio: 15 (ref 10–24)
BUN: 25 mg/dL (ref 8–27)
CHLORIDE: 100 mmol/L (ref 96–106)
CO2: 28 mmol/L (ref 20–29)
CREATININE: 1.7 mg/dL — AB (ref 0.76–1.27)
Calcium: 9.6 mg/dL (ref 8.6–10.2)
GFR calc Af Amer: 50 mL/min/{1.73_m2} — ABNORMAL LOW (ref >59)
GFR calc non Af Amer: 43 mL/min/{1.73_m2} — ABNORMAL LOW (ref >59)
GLOBULIN, TOTAL: 2.2 g/dL (ref 1.5–4.5)
GLUCOSE: 77 mg/dL (ref 65–99)
POTASSIUM: 4.3 mmol/L (ref 3.5–5.2)
SODIUM: 142 mmol/L (ref 134–144)
Total Protein: 6.4 g/dL (ref 6.0–8.5)

## 2017-04-03 LAB — BAYER DCA HB A1C WAIVED: HB A1C (BAYER DCA - WAIVED): 6.9 % (ref <7.0)

## 2017-04-09 ENCOUNTER — Telehealth: Payer: Self-pay | Admitting: Nurse Practitioner

## 2017-04-09 NOTE — Telephone Encounter (Signed)
Patient notified of lab results

## 2017-07-09 ENCOUNTER — Ambulatory Visit: Payer: 59 | Admitting: Nurse Practitioner

## 2017-07-09 ENCOUNTER — Encounter: Payer: Self-pay | Admitting: Nurse Practitioner

## 2017-07-09 VITALS — BP 142/76 | HR 87 | Temp 96.8°F | Ht 74.0 in | Wt 185.0 lb

## 2017-07-09 DIAGNOSIS — E785 Hyperlipidemia, unspecified: Secondary | ICD-10-CM

## 2017-07-09 DIAGNOSIS — I10 Essential (primary) hypertension: Secondary | ICD-10-CM | POA: Diagnosis not present

## 2017-07-09 DIAGNOSIS — E119 Type 2 diabetes mellitus without complications: Secondary | ICD-10-CM | POA: Diagnosis not present

## 2017-07-09 LAB — BAYER DCA HB A1C WAIVED: HB A1C: 6.8 % (ref <7.0)

## 2017-07-09 MED ORDER — METFORMIN HCL 1000 MG PO TABS: 1000.0000 mg | ORAL_TABLET | Freq: Two times a day (BID) | ORAL | 1 refills | Status: DC

## 2017-07-09 MED ORDER — LISINOPRIL-HYDROCHLOROTHIAZIDE 20-12.5 MG PO TABS: 2.0000 | ORAL_TABLET | Freq: Every day | ORAL | 1 refills | Status: DC

## 2017-07-09 MED ORDER — GLIMEPIRIDE 2 MG PO TABS: 2.0000 mg | ORAL_TABLET | Freq: Every day | ORAL | 1 refills | Status: DC

## 2017-07-09 NOTE — Patient Instructions (Signed)

## 2017-07-09 NOTE — Progress Notes (Signed)
Subjective:    Patient ID: Philip Richardson, male    DOB: 11/30/1955, 62 y.o.   MRN: [id number]  HPI  Sonny Anthes is here today for follow up of chronic medical problem.  Outpatient Encounter Medications as of 07/09/2017  Medication Sig  . glimepiride (AMARYL) 2 MG tablet Take 1 tablet (2 mg total) by mouth daily with breakfast.  . lisinopril-hydrochlorothiazide (ZESTORETIC) 20-12.5 MG tablet Take 2 tablets by mouth daily.  . metFORMIN (GLUCOPHAGE) 1000 MG tablet Take 1 tablet (1,000 mg total) by mouth 2 (two) times daily with a meal.  . ONETOUCH DELICA LANCETS 87G MISC      1. Type 2 diabetes mellitus without complication, without long-term current use of insulin ([place]) last hgba1c was 6.9%. Fasting blood sugars have been running around 130-140 consistently. No hypolgycemia.  2. Essential hypertension  No c/o chest pain, sob or headache. Does not check blood pressure at home. BP Readings from Last 3 Encounters:  04/02/17 (!) 145/80  12/25/16 (!) 161/83  09/25/16 (!) 149/79     3. Hyperlipidemia with target LDL less than 100  Does not watch diet at all.    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 Negative for activity change.  HENT: Negative.   Eyes: Negative for pain.  Respiratory: Negative for shortness of breath.   Cardiovascular: Negative for chest pain, palpitations and leg swelling.  Gastrointestinal: Negative for abdominal pain.  Endocrine: Negative for polydipsia.  Genitourinary: Negative.   Skin: Negative for rash.  Neurological: Negative for dizziness, weakness and headaches.  Hematological: Does not bruise/bleed easily.  Psychiatric/Behavioral: Negative.   All other systems reviewed and are negative.      Objective:   Physical Exam  Constitutional: He is oriented to person, place, and time. He appears well-developed and  well-nourished.  HENT:  Head: Normocephalic.  Right Ear: External ear normal.  Left Ear: External ear normal.  Nose: Nose normal.  Mouth/Throat: Oropharynx is clear and moist.  Eyes: EOM are normal. Pupils are equal, round, and reactive to light.  Neck: Normal range of motion. Neck supple. No JVD present. No thyromegaly present.  Cardiovascular: Normal rate, regular rhythm, normal heart sounds and intact distal pulses. Exam reveals no gallop and no friction rub.  No murmur heard. Pulmonary/Chest: Effort normal and breath sounds normal. No respiratory distress. He has no wheezes. He has no rales. He exhibits no tenderness.  Abdominal: Soft. Bowel sounds are normal. He exhibits no mass. There is no tenderness.  Genitourinary: Prostate normal and penis normal.  Musculoskeletal: Normal range of motion. He exhibits no edema.  Lymphadenopathy:    He has no cervical adenopathy.  Neurological: He is alert and oriented to person, place, and time. No cranial nerve deficit.  Skin: Skin is warm and dry.  Psychiatric: He has a normal mood and affect. His behavior is normal. Judgment and thought content normal.   BP (!) 142/76 (BP Location: Left Arm, Cuff Size: Normal)   Pulse 87   Temp (!) 96.8 F (36 C) (Oral)   Ht '6\' 2"'  (1.88 m)   Wt 185 lb (83.9 kg)   BMI 23.75 kg/m   hgba1c 6.8% today      Assessment & Plan:  1. Type 2 diabetes mellitus without complication, without long-term current use of insulin (HCC) Continue  To watch carbs in diet - Bayer DCA Hb A1c Waived -  glimepiride (AMARYL) 2 MG tablet; Take 1 tablet (2 mg total) by mouth daily with breakfast.  Dispense: 90 tablet; Refill: 1 - metFORMIN (GLUCOPHAGE) 1000 MG tablet; Take 1 tablet (1,000 mg total) by mouth 2 (two) times daily with a meal.  Dispense: 180 tablet; Refill: 1  2. Essential hypertension Low sodium diet - CMP14+EGFR - lisinopril-hydrochlorothiazide (ZESTORETIC) 20-12.5 MG tablet; Take 2 tablets by mouth daily.   Dispense: 180 tablet; Refill: 1  3. Hyperlipidemia with target LDL less than 100 Low fat diet - Lipid panel    Labs pending Health maintenance reviewed Diet and exercise encouraged Continue all meds Follow up  In 3 months   [place], FNP

## 2017-07-10 LAB — CMP14+EGFR
A/G RATIO: 1.8 (ref 1.2–2.2)
ALBUMIN: 4.4 g/dL (ref 3.6–4.8)
ALK PHOS: 79 IU/L (ref 39–117)
ALT: 20 IU/L (ref 0–44)
AST: 20 IU/L (ref 0–40)
BILIRUBIN TOTAL: 0.3 mg/dL (ref 0.0–1.2)
BUN/Creatinine Ratio: 13 (ref 10–24)
BUN: 23 mg/dL (ref 8–27)
CHLORIDE: 102 mmol/L (ref 96–106)
CO2: 24 mmol/L (ref 20–29)
Calcium: 10.5 mg/dL — ABNORMAL HIGH (ref 8.6–10.2)
Creatinine, Ser: 1.8 mg/dL — ABNORMAL HIGH (ref 0.76–1.27)
GFR calc non Af Amer: 40 mL/min/{1.73_m2} — ABNORMAL LOW (ref >59)
GFR, EST AFRICAN AMERICAN: 46 mL/min/{1.73_m2} — AB (ref >59)
GLOBULIN, TOTAL: 2.4 g/dL (ref 1.5–4.5)
Glucose: 64 mg/dL — ABNORMAL LOW (ref 65–99)
Potassium: 4.6 mmol/L (ref 3.5–5.2)
SODIUM: 146 mmol/L — AB (ref 134–144)
TOTAL PROTEIN: 6.8 g/dL (ref 6.0–8.5)

## 2017-07-10 LAB — LIPID PANEL
CHOLESTEROL TOTAL: 183 mg/dL (ref 100–199)
Chol/HDL Ratio: 4.6 ratio (ref 0.0–5.0)
HDL: 40 mg/dL (ref >39)
LDL Calculated: 103 mg/dL — ABNORMAL HIGH (ref 0–99)
Triglycerides: 199 mg/dL — ABNORMAL HIGH (ref 0–149)
VLDL CHOLESTEROL CAL: 40 mg/dL (ref 5–40)

## 2017-10-08 ENCOUNTER — Ambulatory Visit: Payer: 59 | Admitting: Nurse Practitioner

## 2017-10-08 ENCOUNTER — Encounter: Payer: Self-pay | Admitting: Nurse Practitioner

## 2017-10-08 VITALS — BP 127/68 | HR 69 | Temp 98.5°F | Ht 74.0 in | Wt 186.0 lb

## 2017-10-08 DIAGNOSIS — E119 Type 2 diabetes mellitus without complications: Secondary | ICD-10-CM

## 2017-10-08 DIAGNOSIS — I10 Essential (primary) hypertension: Secondary | ICD-10-CM

## 2017-10-08 DIAGNOSIS — E785 Hyperlipidemia, unspecified: Secondary | ICD-10-CM | POA: Diagnosis not present

## 2017-10-08 LAB — BAYER DCA HB A1C WAIVED: HB A1C: 6.8 % (ref <7.0)

## 2017-10-08 NOTE — Progress Notes (Signed)
Subjective:    Patient ID: Philip Richardson, male    DOB: August 13, 1955, 62 y.o.   MRN: [id number]   Chief Complaint: Medical management of chronic issues  HPI:  1. Type 2 diabetes mellitus without complication, without long-term current use of insulin ([place]) last hgba1c was 6.8% . He does not check blood sugars at home veery often.  2. Hyperlipidemia with target LDL less than 100  Tries to avoid fried foods.  3. Essential hypertension  No c/o chest pain, sob or headache. Does not check blood pressure at home. BP Readings from Last 3 Encounters:  10/08/17 127/68  07/09/17 (!) 142/76  04/02/17 (!) 145/80       Outpatient Encounter Medications as of 10/08/2017  Medication Sig  . glimepiride (AMARYL) 2 MG tablet Take 1 tablet (2 mg total) by mouth daily with breakfast.  . lisinopril-hydrochlorothiazide (ZESTORETIC) 20-12.5 MG tablet Take 2 tablets by mouth daily.  . metFORMIN (GLUCOPHAGE) 1000 MG tablet Take 1 tablet (1,000 mg total) by mouth 2 (two) times daily with a meal.  . ONETOUCH DELICA LANCETS 37T MISC       New complaints: None today  Social history: Lives by hisself. Retired from town [place]  Constitutional: Negative for activity change and appetite change.  HENT: Negative.   Eyes: Negative for pain.  Respiratory: Negative for shortness of breath.   Cardiovascular: Negative for chest pain, palpitations and leg swelling.  Gastrointestinal: Negative for abdominal pain.  Endocrine: Negative for polydipsia.  Genitourinary: Negative.   Skin: Negative for rash.  Neurological: Negative for dizziness, weakness and headaches.  Hematological: Does not bruise/bleed easily.  Psychiatric/Behavioral: Negative.   All other systems reviewed and are negative.      Objective:   Physical Exam  Constitutional: He is oriented to person, place, and time. He appears well-developed and well-nourished.  HENT:  Head: Normocephalic.  Nose: Nose normal.    Mouth/Throat: Oropharynx is clear and moist.  Eyes: Pupils are equal, round, and reactive to light. EOM are normal.  Neck: Normal range of motion and phonation normal. Neck supple. No JVD present. Carotid bruit is not present. No thyroid mass and no thyromegaly present.  Cardiovascular: Normal rate and regular rhythm.  Pulmonary/Chest: Effort normal and breath sounds normal. No respiratory distress.  Abdominal: Soft. Normal appearance, normal aorta and bowel sounds are normal. There is no tenderness.  Musculoskeletal: Normal range of motion.  Lymphadenopathy:    He has no cervical adenopathy.  Neurological: He is alert and oriented to person, place, and time.  Skin: Skin is warm and dry.  Psychiatric: Judgment normal.  Vitals reviewed.   BP 127/68   Pulse 69   Temp 98.5 F (36.9 C) (Oral)   Ht '6\' 2"'$  (1.88 m)   Wt 186 lb (84.4 kg)   BMI 23.88 kg/m   hgba1c 6.8%     Assessment & Plan:  Philip Richardson comes in today with chief complaint of Medical Management of Chronic Issues   Diagnosis and orders addressed:  1. Type 2 diabetes mellitus without complication, without long-term current use of insulin (HCC) Continue to watch carbs in diet - Bayer DCA Hb A1c Waived - Microalbumin / creatinine urine ratio  2. Hyperlipidemia with target LDL less than 100 Low fat diet - Lipid panel  3. Essential hypertension Watch sodium content in diet - CMP14+EGFR   Labs pending Health Maintenance reviewed Diet and exercise encouraged  Follow up plan: 3 months   Mary-Margaret  Hassell Done, [place]

## 2017-10-08 NOTE — Patient Instructions (Signed)
DASH Eating Plan DASH stands for "Dietary Approaches to Stop Hypertension." The DASH eating plan is a healthy eating plan that has been shown to reduce high blood pressure (hypertension). It may also reduce your risk for type 2 diabetes, heart disease, and stroke. The DASH eating plan may also help with weight loss. What are tips for following this plan? General guidelines  Avoid eating more than 2,300 mg (milligrams) of salt (sodium) a day. If you have hypertension, you may need to reduce your sodium intake to 1,500 mg a day.  Limit alcohol intake to no more than 1 drink a day for nonpregnant women and 2 drinks a day for men. One drink equals 12 oz of beer, 5 oz of wine, or 1 oz of hard liquor.  Work with your health care provider to maintain a healthy body weight or to lose weight. Ask what an ideal weight is for you.  Get at least 30 minutes of exercise that causes your heart to beat faster (aerobic exercise) most days of the week. Activities may include walking, swimming, or biking.  Work with your health care provider or diet and nutrition specialist (dietitian) to adjust your eating plan to your individual calorie needs. Reading food labels  Check food labels for the amount of sodium per serving. Choose foods with less than 5 percent of the Daily Value of sodium. Generally, foods with less than 300 mg of sodium per serving fit into this eating plan.  To find whole grains, look for the word "whole" as the first word in the ingredient list. Shopping  Buy products labeled as "low-sodium" or "no salt added."  Buy fresh foods. Avoid canned foods and premade or frozen meals. Cooking  Avoid adding salt when cooking. Use salt-free seasonings or herbs instead of table salt or sea salt. Check with your health care provider or pharmacist before using salt substitutes.  Do not fry foods. Cook foods using healthy methods such as baking, boiling, grilling, and broiling instead.  Cook with  heart-healthy oils, such as olive, canola, soybean, or sunflower oil. Meal planning   Eat a balanced diet that includes: ? 5 or more servings of fruits and vegetables each day. At each meal, try to fill half of your plate with fruits and vegetables. ? Up to 6-8 servings of whole grains each day. ? Less than 6 oz of lean meat, poultry, or fish each day. A 3-oz serving of meat is about the same size as a deck of cards. One egg equals 1 oz. ? 2 servings of low-fat dairy each day. ? A serving of nuts, seeds, or beans 5 times each week. ? Heart-healthy fats. Healthy fats called Omega-3 fatty acids are found in foods such as flaxseeds and coldwater fish, like sardines, salmon, and mackerel.  Limit how much you eat of the following: ? Canned or prepackaged foods. ? Food that is high in trans fat, such as fried foods. ? Food that is high in saturated fat, such as fatty meat. ? Sweets, desserts, sugary drinks, and other foods with added sugar. ? Full-fat dairy products.  Do not salt foods before eating.  Try to eat at least 2 vegetarian meals each week.  Eat more home-cooked food and less restaurant, buffet, and fast food.  When eating at a restaurant, ask that your food be prepared with less salt or no salt, if possible. What foods are recommended? The items listed may not be a complete list. Talk with your dietitian about what   dietary choices are best for you. Grains Whole-grain or whole-wheat bread. Whole-grain or whole-wheat pasta. Brown rice. Oatmeal. Quinoa. Bulgur. Whole-grain and low-sodium cereals. Pita bread. Low-fat, low-sodium crackers. Whole-wheat flour tortillas. Vegetables Fresh or frozen vegetables (raw, steamed, roasted, or grilled). Low-sodium or reduced-sodium tomato and vegetable juice. Low-sodium or reduced-sodium tomato sauce and tomato paste. Low-sodium or reduced-sodium canned vegetables. Fruits All fresh, dried, or frozen fruit. Canned fruit in natural juice (without  added sugar). Meat and other protein foods Skinless chicken or turkey. Ground chicken or turkey. Pork with fat trimmed off. Fish and seafood. Egg whites. Dried beans, peas, or lentils. Unsalted nuts, nut butters, and seeds. Unsalted canned beans. Lean cuts of beef with fat trimmed off. Low-sodium, lean deli meat. Dairy Low-fat (1%) or fat-free (skim) milk. Fat-free, low-fat, or reduced-fat cheeses. Nonfat, low-sodium ricotta or cottage cheese. Low-fat or nonfat yogurt. Low-fat, low-sodium cheese. Fats and oils Soft margarine without trans fats. Vegetable oil. Low-fat, reduced-fat, or light mayonnaise and salad dressings (reduced-sodium). Canola, safflower, olive, soybean, and sunflower oils. Avocado. Seasoning and other foods Herbs. Spices. Seasoning mixes without salt. Unsalted popcorn and pretzels. Fat-free sweets. What foods are not recommended? The items listed may not be a complete list. Talk with your dietitian about what dietary choices are best for you. Grains Baked goods made with fat, such as croissants, muffins, or some breads. Dry pasta or rice meal packs. Vegetables Creamed or fried vegetables. Vegetables in a cheese sauce. Regular canned vegetables (not low-sodium or reduced-sodium). Regular canned tomato sauce and paste (not low-sodium or reduced-sodium). Regular tomato and vegetable juice (not low-sodium or reduced-sodium). Pickles. Olives. Fruits Canned fruit in a light or heavy syrup. Fried fruit. Fruit in cream or butter sauce. Meat and other protein foods Fatty cuts of meat. Ribs. Fried meat. Bacon. Sausage. Bologna and other processed lunch meats. Salami. Fatback. Hotdogs. Bratwurst. Salted nuts and seeds. Canned beans with added salt. Canned or smoked fish. Whole eggs or egg yolks. Chicken or turkey with skin. Dairy Whole or 2% milk, cream, and half-and-half. Whole or full-fat cream cheese. Whole-fat or sweetened yogurt. Full-fat cheese. Nondairy creamers. Whipped toppings.  Processed cheese and cheese spreads. Fats and oils Butter. Stick margarine. Lard. Shortening. Ghee. Bacon fat. Tropical oils, such as coconut, palm kernel, or palm oil. Seasoning and other foods Salted popcorn and pretzels. Onion salt, garlic salt, seasoned salt, table salt, and sea salt. Worcestershire sauce. Tartar sauce. Barbecue sauce. Teriyaki sauce. Soy sauce, including reduced-sodium. Steak sauce. Canned and packaged gravies. Fish sauce. Oyster sauce. Cocktail sauce. Horseradish that you find on the shelf. Ketchup. Mustard. Meat flavorings and tenderizers. Bouillon cubes. Hot sauce and Tabasco sauce. Premade or packaged marinades. Premade or packaged taco seasonings. Relishes. Regular salad dressings. Where to find more information:  National Heart, Lung, and Blood Institute: www.nhlbi.nih.gov  American Heart Association: www.heart.org Summary  The DASH eating plan is a healthy eating plan that has been shown to reduce high blood pressure (hypertension). It may also reduce your risk for type 2 diabetes, heart disease, and stroke.  With the DASH eating plan, you should limit salt (sodium) intake to 2,300 mg a day. If you have hypertension, you may need to reduce your sodium intake to 1,500 mg a day.  When on the DASH eating plan, aim to eat more fresh fruits and vegetables, whole grains, lean proteins, low-fat dairy, and heart-healthy fats.  Work with your health care provider or diet and nutrition specialist (dietitian) to adjust your eating plan to your individual   calorie needs. This information is not intended to replace advice given to you by your health care provider. Make sure you discuss any questions you have with your health care provider. Document Released: 05/10/2011 Document Revised: 05/14/2016 Document Reviewed: 05/14/2016 Elsevier Interactive Patient Education  2018 Elsevier Inc.  

## 2017-10-09 LAB — LIPID PANEL
CHOL/HDL RATIO: 4.5 ratio (ref 0.0–5.0)
Cholesterol, Total: 154 mg/dL (ref 100–199)
HDL: 34 mg/dL — AB (ref >39)
LDL CALC: 82 mg/dL (ref 0–99)
TRIGLYCERIDES: 190 mg/dL — AB (ref 0–149)
VLDL Cholesterol Cal: 38 mg/dL (ref 5–40)

## 2017-10-09 LAB — CMP14+EGFR
A/G RATIO: 1.8 (ref 1.2–2.2)
ALK PHOS: 72 IU/L (ref 39–117)
ALT: 19 IU/L (ref 0–44)
AST: 15 IU/L (ref 0–40)
Albumin: 4 g/dL (ref 3.6–4.8)
BUN/Creatinine Ratio: 12 (ref 10–24)
BUN: 21 mg/dL (ref 8–27)
Bilirubin Total: 0.2 mg/dL (ref 0.0–1.2)
CO2: 26 mmol/L (ref 20–29)
Calcium: 9.4 mg/dL (ref 8.6–10.2)
Chloride: 99 mmol/L (ref 96–106)
Creatinine, Ser: 1.78 mg/dL — ABNORMAL HIGH (ref 0.76–1.27)
GFR calc Af Amer: 47 mL/min/{1.73_m2} — ABNORMAL LOW (ref >59)
GFR calc non Af Amer: 40 mL/min/{1.73_m2} — ABNORMAL LOW (ref >59)
GLOBULIN, TOTAL: 2.2 g/dL (ref 1.5–4.5)
Glucose: 120 mg/dL — ABNORMAL HIGH (ref 65–99)
POTASSIUM: 4.1 mmol/L (ref 3.5–5.2)
SODIUM: 140 mmol/L (ref 134–144)
Total Protein: 6.2 g/dL (ref 6.0–8.5)

## 2018-01-07 ENCOUNTER — Encounter: Payer: Self-pay | Admitting: Nurse Practitioner

## 2018-01-07 ENCOUNTER — Ambulatory Visit: Payer: BLUE CROSS/BLUE SHIELD | Admitting: Nurse Practitioner

## 2018-01-07 VITALS — BP 140/76 | HR 65 | Temp 97.6°F | Ht 74.0 in | Wt 187.0 lb

## 2018-01-07 DIAGNOSIS — E785 Hyperlipidemia, unspecified: Secondary | ICD-10-CM

## 2018-01-07 DIAGNOSIS — E119 Type 2 diabetes mellitus without complications: Secondary | ICD-10-CM | POA: Diagnosis not present

## 2018-01-07 DIAGNOSIS — Z1211 Encounter for screening for malignant neoplasm of colon: Secondary | ICD-10-CM | POA: Diagnosis not present

## 2018-01-07 DIAGNOSIS — I10 Essential (primary) hypertension: Secondary | ICD-10-CM | POA: Diagnosis not present

## 2018-01-07 DIAGNOSIS — Z1212 Encounter for screening for malignant neoplasm of rectum: Secondary | ICD-10-CM

## 2018-01-07 LAB — BAYER DCA HB A1C WAIVED: HB A1C: 7 % — AB (ref <7.0)

## 2018-01-07 MED ORDER — GLIMEPIRIDE 2 MG PO TABS: 2.0000 mg | ORAL_TABLET | Freq: Every day | ORAL | 1 refills | Status: DC

## 2018-01-07 MED ORDER — METFORMIN HCL 1000 MG PO TABS: 1000.0000 mg | ORAL_TABLET | Freq: Two times a day (BID) | ORAL | 1 refills | Status: DC

## 2018-01-07 MED ORDER — LISINOPRIL-HYDROCHLOROTHIAZIDE 20-12.5 MG PO TABS: 2.0000 | ORAL_TABLET | Freq: Every day | ORAL | 1 refills | Status: DC

## 2018-01-07 NOTE — Progress Notes (Signed)
Subjective:    Patient ID: Philip Richardson, male    DOB: 03/24/1956, 62 y.o.   MRN: [id number]   Chief Complaint: Medical Management of Chronic Issues  HPI:  1. Type 2 diabetes mellitus without complication, without long-term current use of insulin ([place]) last hgba1c was 6.8%. He does not check blood sugars everyday. He does try to watch diet. denies any symptoms of hypoglycemia.  2. Hyperlipidemia with target LDL less than 100  Tries to avoid fried foods. Does very little exercse  3. Essential hypertension  No c/o chest pain, sob or headache. Does not check blood pressure at home. BP Readings from Last 3 Encounters:  10/08/17 127/68  07/09/17 (!) 142/76  04/02/17 (!) 145/80       Outpatient Encounter Medications as of 01/07/2018  Medication Sig  . glimepiride (AMARYL) 2 MG tablet Take 1 tablet (2 mg total) by mouth daily with breakfast.  . lisinopril-hydrochlorothiazide (ZESTORETIC) 20-12.5 MG tablet Take 2 tablets by mouth daily.  . metFORMIN (GLUCOPHAGE) 1000 MG tablet Take 1 tablet (1,000 mg total) by mouth 2 (two) times daily with a meal.  . ONETOUCH DELICA LANCETS 48O MISC       New complaints: None today  Social history: Retired from town of [place]. Still lives alone.   Review of Systems  Constitutional: Negative for activity change and appetite change.  HENT: Negative.   Eyes: Negative for pain.  Respiratory: Negative for shortness of breath.   Cardiovascular: Negative for chest pain, palpitations and leg swelling.  Gastrointestinal: Negative for abdominal pain.  Endocrine: Negative for polydipsia.  Genitourinary: Negative.   Skin: Negative for rash.  Neurological: Negative for dizziness, weakness and headaches.  Hematological: Does not bruise/bleed easily.  Psychiatric/Behavioral: Negative.   All other systems reviewed and are negative.      Objective:   Physical Exam  Constitutional: He is oriented to person, place, and time. He appears  well-developed and well-nourished.  HENT:  Head: Normocephalic.  Nose: Nose normal.  Mouth/Throat: Oropharynx is clear and moist.  Eyes: Pupils are equal, round, and reactive to light. EOM are normal.  Neck: Normal range of motion and phonation normal. Neck supple. No JVD present. Carotid bruit is not present. No thyroid mass and no thyromegaly present.  Cardiovascular: Normal rate and regular rhythm.  Pulmonary/Chest: Effort normal and breath sounds normal. No respiratory distress.  Abdominal: Soft. Normal appearance, normal aorta and bowel sounds are normal. There is no tenderness.  Musculoskeletal: Normal range of motion.  Lymphadenopathy:    He has no cervical adenopathy.  Neurological: He is alert and oriented to person, place, and time.  Skin: Skin is warm and dry.  Psychiatric: He has a normal mood and affect. His behavior is normal. Judgment and thought content normal.  Nursing note and vitals reviewed.  BP 140/76   Pulse 65   Temp 97.6 F (36.4 C) (Oral)   Ht _0  (1.88 m)   Wt 187 lb (84.8 kg)   BMI 24.01 kg/m   hgba1c 7.0%       Assessment & Plan:  Philip Richardson comes in today with chief complaint of No chief complaint on file.   Diagnosis and orders addressed:  1. Type 2 diabetes mellitus without complication, without long-term current use of insulin (HCC) Continue to wath carbsin diet - Bayer DCA Hb A1c Waived - Microalbumin / creatinine urine ratio  2. Hyperlipidemia with target LDL less than 100 Low fat diet - Lipid panel  3. Essential hypertension  Low sodium diet - CMP14+EGFR  4. Encounter for colorectal cancer screening 3 months  Meds ordered this encounter  Medications  . lisinopril-hydrochlorothiazide (ZESTORETIC) 20-12.5 MG tablet    Sig: Take 2 tablets by mouth daily.    Dispense:  180 tablet    Refill:  1    Note dose change    Order Specific Question:   Supervising Provider    Answer:   VINCENT, CAROL L [[id number]]  . glimepiride  (AMARYL) 2 MG tablet    Sig: Take 1 tablet (2 mg total) by mouth daily with breakfast.    Dispense:  90 tablet    Refill:  1    Order Specific Question:   Supervising Provider    Answer:   VINCENT, CAROL L [[id number]]  . metFORMIN (GLUCOPHAGE) 1000 MG tablet    Sig: Take 1 tablet (1,000 mg total) by mouth 2 (two) times daily with a meal.    Dispense:  180 tablet    Refill:  1    Order Specific Question:   Supervising Provider    Answer:   [place] pending Health Maintenance reviewed Diet and exercise encouraged  Follow up plan: 3 months   [place], FNP

## 2018-01-07 NOTE — Patient Instructions (Signed)

## 2018-01-08 LAB — CMP14+EGFR
ALBUMIN: 4.1 g/dL (ref 3.6–4.8)
ALK PHOS: 76 IU/L (ref 39–117)
ALT: 17 IU/L (ref 0–44)
AST: 17 IU/L (ref 0–40)
Albumin/Globulin Ratio: 1.6 (ref 1.2–2.2)
BUN / CREAT RATIO: 13 (ref 10–24)
BUN: 22 mg/dL (ref 8–27)
Bilirubin Total: 0.2 mg/dL (ref 0.0–1.2)
CO2: 26 mmol/L (ref 20–29)
CREATININE: 1.74 mg/dL — AB (ref 0.76–1.27)
Calcium: 9.9 mg/dL (ref 8.6–10.2)
Chloride: 98 mmol/L (ref 96–106)
GFR calc Af Amer: 48 mL/min/{1.73_m2} — ABNORMAL LOW (ref >59)
GFR calc non Af Amer: 41 mL/min/{1.73_m2} — ABNORMAL LOW (ref >59)
GLUCOSE: 123 mg/dL — AB (ref 65–99)
Globulin, Total: 2.5 g/dL (ref 1.5–4.5)
Potassium: 4.8 mmol/L (ref 3.5–5.2)
Sodium: 139 mmol/L (ref 134–144)
Total Protein: 6.6 g/dL (ref 6.0–8.5)

## 2018-01-08 LAB — LIPID PANEL
CHOLESTEROL TOTAL: 175 mg/dL (ref 100–199)
Chol/HDL Ratio: 5.1 ratio — ABNORMAL HIGH (ref 0.0–5.0)
HDL: 34 mg/dL — ABNORMAL LOW (ref >39)
LDL CALC: 98 mg/dL (ref 0–99)
TRIGLYCERIDES: 216 mg/dL — AB (ref 0–149)
VLDL Cholesterol Cal: 43 mg/dL — ABNORMAL HIGH (ref 5–40)

## 2018-02-04 DIAGNOSIS — Z1211 Encounter for screening for malignant neoplasm of colon: Secondary | ICD-10-CM | POA: Diagnosis not present

## 2018-02-04 DIAGNOSIS — Z1212 Encounter for screening for malignant neoplasm of rectum: Secondary | ICD-10-CM | POA: Diagnosis not present

## 2018-02-08 LAB — COLOGUARD
Cologuard: NEGATIVE
Cologuard: NEGATIVE

## 2018-02-11 ENCOUNTER — Encounter: Payer: Self-pay | Admitting: *Deleted

## 2018-02-18 LAB — HM DIABETES EYE EXAM

## 2018-04-08 ENCOUNTER — Ambulatory Visit: Payer: BLUE CROSS/BLUE SHIELD | Admitting: Nurse Practitioner

## 2018-04-08 ENCOUNTER — Encounter: Payer: Self-pay | Admitting: Nurse Practitioner

## 2018-04-08 VITALS — BP 147/78 | HR 65 | Temp 97.8°F | Ht 74.0 in | Wt 183.0 lb

## 2018-04-08 DIAGNOSIS — E785 Hyperlipidemia, unspecified: Secondary | ICD-10-CM | POA: Diagnosis not present

## 2018-04-08 DIAGNOSIS — I10 Essential (primary) hypertension: Secondary | ICD-10-CM

## 2018-04-08 DIAGNOSIS — E119 Type 2 diabetes mellitus without complications: Secondary | ICD-10-CM

## 2018-04-08 DIAGNOSIS — K219 Gastro-esophageal reflux disease without esophagitis: Secondary | ICD-10-CM | POA: Diagnosis not present

## 2018-04-08 LAB — BAYER DCA HB A1C WAIVED: HB A1C (BAYER DCA - WAIVED): 7.2 % — ABNORMAL HIGH (ref <7.0)

## 2018-04-08 MED ORDER — LISINOPRIL-HYDROCHLOROTHIAZIDE 20-12.5 MG PO TABS: 2.0000 | ORAL_TABLET | Freq: Every day | ORAL | 1 refills | Status: DC

## 2018-04-08 MED ORDER — METFORMIN HCL 1000 MG PO TABS: 1000.0000 mg | ORAL_TABLET | Freq: Two times a day (BID) | ORAL | 1 refills | Status: DC

## 2018-04-08 MED ORDER — GLIMEPIRIDE 2 MG PO TABS: 2.0000 mg | ORAL_TABLET | Freq: Every day | ORAL | 1 refills | Status: DC

## 2018-04-08 MED ORDER — OMEPRAZOLE 40 MG PO CPDR: 40.0000 mg | DELAYED_RELEASE_CAPSULE | Freq: Every day | ORAL | 3 refills | Status: DC

## 2018-04-08 NOTE — Progress Notes (Signed)
Subjective:    Patient ID: Philip Richardson, male    DOB: 11-07-1955, 62 y.o.   MRN: [id number]   Chief Complaint: Medical management of chronic issues  HPI:  1. Essential hypertension  -does not check BP at home -no C/O of CP/SOB/HA -does not watch salt intake in diet BP Readings from Last 3 Encounters:  04/08/18 (!) 147/78  01/07/18 140/76  10/08/17 127/68    2. Type 2 diabetes mellitus without complication, without long-term current use of insulin (HCC)  -checks blood sugars occasionally -no symptoms of hypoglycemia -last A1C on 01/07/18 was 7.0  3. Hyperlipidemia with target LDL less than 100  -does not watch his fat intake in diet -little exercise -Cholesteroff OTC    Outpatient Encounter Medications as of 04/08/2018  Medication Sig  . glimepiride (AMARYL) 2 MG tablet Take 1 tablet (2 mg total) by mouth daily with breakfast.  . lisinopril-hydrochlorothiazide (ZESTORETIC) 20-12.5 MG tablet Take 2 tablets by mouth daily.  . metFORMIN (GLUCOPHAGE) 1000 MG tablet Take 1 tablet (1,000 mg total) by mouth 2 (two) times daily with a meal.  . ONETOUCH DELICA LANCETS 74Q MISC    No facility-administered encounter medications on file as of 04/08/2018.     New complaints: Indigestion on and off for over a year, TUMs helps calm down symptoms some, but burning in throat/chest makes him cough with almost every meal. No dark/black stools or swallowing difficulties  Social history: Works part-time at [employer]  Constitutional: Negative for chills, fatigue, fever and unexpected weight change.  HENT: Negative for congestion, ear pain, rhinorrhea, sinus pressure and sore throat.   Eyes: Negative for pain, redness and visual disturbance.  Respiratory: Negative for cough, chest tightness, shortness of breath and wheezing.   Cardiovascular: Negative for chest pain, palpitations and leg swelling.  Gastrointestinal: Negative for abdominal pain, constipation,  diarrhea, nausea and vomiting.  Endocrine: Negative for cold intolerance, heat intolerance, polydipsia, polyphagia and polyuria.  Genitourinary: Negative for difficulty urinating, dysuria and urgency.  Musculoskeletal: Negative for arthralgias, joint swelling and myalgias.  Skin: Negative for rash and wound.  Allergic/Immunologic: Negative for environmental allergies and food allergies.  Neurological: Negative for dizziness and headaches.  Hematological: Does not bruise/bleed easily.  Psychiatric/Behavioral: Negative for confusion, sleep disturbance and suicidal ideas. The patient is not nervous/anxious.        Objective:   Physical Exam  Constitutional: He is oriented to person, place, and time. He appears well-developed and well-nourished.  HENT:  Head: Normocephalic and atraumatic.  Right Ear: External ear normal.  Left Ear: External ear normal.  Nose: Nose normal.  Mouth/Throat: Oropharynx is clear and moist. No oropharyngeal exudate.  Eyes: Pupils are equal, round, and reactive to light. Conjunctivae and EOM are normal.  Neck: Normal range of motion. Neck supple. No thyromegaly present.  Cardiovascular: Normal rate, regular rhythm, normal heart sounds and intact distal pulses.  Pulmonary/Chest: Effort normal and breath sounds normal.  Abdominal: Soft. Bowel sounds are normal.  Musculoskeletal: Normal range of motion.  Neurological: He is alert and oriented to person, place, and time. He displays normal reflexes. No cranial nerve deficit.  Skin: Skin is warm and dry.  Psychiatric: He has a normal mood and affect. His behavior is normal. Judgment and thought content normal.  Nursing note and vitals reviewed.  A1C today is 7.2, up from 7.0 on 01/07/18    BP (!) 147/78   Pulse 65   Temp 97.8 F (36.6 C) (  Oral)   Ht 6' 2" (1.88 m)   Wt 183 lb (83 kg)   BMI 23.50 kg/m   PLAN:  [name] comes in today with chief complaint of Medical Management of Chronic  Issues   Diagnosis and orders addressed:  1. Essential hypertension -limit salt intake in diet -check BP at home - CMP14+EGFR - lisinopril-hydrochlorothiazide (ZESTORETIC) 20-12.5 MG tablet; Take 2 tablets by mouth daily.  Dispense: 180 tablet; Refill: 1  2. Type 2 diabetes mellitus without complication, without long-term current use of insulin (HCC) -watch carbs/sweets -check BS at home - Bayer DCA Hb A1c Waived - glimepiride (AMARYL) 2 MG tablet; Take 1 tablet (2 mg total) by mouth daily with breakfast.  Dispense: 90 tablet; Refill: 1 - metFORMIN (GLUCOPHAGE) 1000 MG tablet; Take 1 tablet (1,000 mg total) by mouth 2 (two) times daily with a meal.  Dispense: 180 tablet; Refill: 1  3. Hyperlipidemia with target LDL less than 100 -low fat diet; avoid fried/fatty foods - Lipid panel - LDL Cholesterol, Direct  4. Gastroesophageal reflux disease without esophagitis -omeprazole 42m po daily -avoid spicy foods -stop eating 2-3 hours before bedtime   Labs pending Health Maintenance reviewed Diet and exercise encouraged  Follow up plan: 3 months   [place] FNP

## 2018-04-08 NOTE — Patient Instructions (Signed)

## 2018-04-09 LAB — CMP14+EGFR
A/G RATIO: 1.6 (ref 1.2–2.2)
ALT: 23 IU/L (ref 0–44)
AST: 19 IU/L (ref 0–40)
Albumin: 4.1 g/dL (ref 3.6–4.8)
Alkaline Phosphatase: 70 IU/L (ref 39–117)
BUN/Creatinine Ratio: 5 — ABNORMAL LOW (ref 10–24)
BUN: 8 mg/dL (ref 8–27)
Bilirubin Total: 0.2 mg/dL (ref 0.0–1.2)
CALCIUM: 10.4 mg/dL — AB (ref 8.6–10.2)
CO2: 34 mmol/L — ABNORMAL HIGH (ref 20–29)
CREATININE: 1.62 mg/dL — AB (ref 0.76–1.27)
Chloride: 98 mmol/L (ref 96–106)
GFR, EST AFRICAN AMERICAN: 52 mL/min/{1.73_m2} — AB (ref >59)
GFR, EST NON AFRICAN AMERICAN: 45 mL/min/{1.73_m2} — AB (ref >59)
Globulin, Total: 2.6 g/dL (ref 1.5–4.5)
Glucose: 110 mg/dL — ABNORMAL HIGH (ref 65–99)
POTASSIUM: 4 mmol/L (ref 3.5–5.2)
SODIUM: 141 mmol/L (ref 134–144)
TOTAL PROTEIN: 6.7 g/dL (ref 6.0–8.5)

## 2018-04-09 LAB — LIPID PANEL
Chol/HDL Ratio: 4.3 ratio (ref 0.0–5.0)
Cholesterol, Total: 177 mg/dL (ref 100–199)
HDL: 41 mg/dL (ref >39)
LDL CALC: 106 mg/dL — AB (ref 0–99)
Triglycerides: 149 mg/dL (ref 0–149)
VLDL CHOLESTEROL CAL: 30 mg/dL (ref 5–40)

## 2018-04-09 LAB — LDL CHOLESTEROL, DIRECT: LDL Direct: 113 mg/dL — ABNORMAL HIGH (ref 0–99)

## 2018-04-29 DIAGNOSIS — X32XXXD Exposure to sunlight, subsequent encounter: Secondary | ICD-10-CM | POA: Diagnosis not present

## 2018-04-29 DIAGNOSIS — Z85828 Personal history of other malignant neoplasm of skin: Secondary | ICD-10-CM | POA: Diagnosis not present

## 2018-04-29 DIAGNOSIS — D225 Melanocytic nevi of trunk: Secondary | ICD-10-CM | POA: Diagnosis not present

## 2018-04-29 DIAGNOSIS — Z08 Encounter for follow-up examination after completed treatment for malignant neoplasm: Secondary | ICD-10-CM | POA: Diagnosis not present

## 2018-04-29 DIAGNOSIS — B078 Other viral warts: Secondary | ICD-10-CM | POA: Diagnosis not present

## 2018-04-29 DIAGNOSIS — C44519 Basal cell carcinoma of skin of other part of trunk: Secondary | ICD-10-CM | POA: Diagnosis not present

## 2018-04-29 DIAGNOSIS — L570 Actinic keratosis: Secondary | ICD-10-CM | POA: Diagnosis not present

## 2018-06-10 DIAGNOSIS — X32XXXD Exposure to sunlight, subsequent encounter: Secondary | ICD-10-CM | POA: Diagnosis not present

## 2018-06-10 DIAGNOSIS — L570 Actinic keratosis: Secondary | ICD-10-CM | POA: Diagnosis not present

## 2018-06-10 DIAGNOSIS — Z08 Encounter for follow-up examination after completed treatment for malignant neoplasm: Secondary | ICD-10-CM | POA: Diagnosis not present

## 2018-06-10 DIAGNOSIS — Z85828 Personal history of other malignant neoplasm of skin: Secondary | ICD-10-CM | POA: Diagnosis not present

## 2018-07-15 ENCOUNTER — Ambulatory Visit: Payer: BLUE CROSS/BLUE SHIELD | Admitting: Nurse Practitioner

## 2018-07-15 ENCOUNTER — Encounter: Payer: Self-pay | Admitting: Nurse Practitioner

## 2018-07-15 VITALS — BP 165/75 | HR 69 | Temp 97.0°F | Ht 74.0 in | Wt 189.0 lb

## 2018-07-15 DIAGNOSIS — I10 Essential (primary) hypertension: Secondary | ICD-10-CM

## 2018-07-15 DIAGNOSIS — H60501 Unspecified acute noninfective otitis externa, right ear: Secondary | ICD-10-CM

## 2018-07-15 DIAGNOSIS — E119 Type 2 diabetes mellitus without complications: Secondary | ICD-10-CM | POA: Diagnosis not present

## 2018-07-15 DIAGNOSIS — E785 Hyperlipidemia, unspecified: Secondary | ICD-10-CM

## 2018-07-15 LAB — BAYER DCA HB A1C WAIVED: HB A1C: 6.9 % (ref <7.0)

## 2018-07-15 MED ORDER — CIPROFLOXACIN-DEXAMETHASONE 0.3-0.1 % OT SUSP: 4.0000 [drp] | Freq: Two times a day (BID) | OTIC | 0 refills | Status: DC

## 2018-07-15 MED ORDER — LISINOPRIL-HYDROCHLOROTHIAZIDE 20-12.5 MG PO TABS: 2.0000 | ORAL_TABLET | Freq: Every day | ORAL | 1 refills | Status: DC

## 2018-07-15 MED ORDER — GLIMEPIRIDE 2 MG PO TABS: 2.0000 mg | ORAL_TABLET | Freq: Every day | ORAL | 1 refills | Status: DC

## 2018-07-15 MED ORDER — METFORMIN HCL 1000 MG PO TABS: 1000.0000 mg | ORAL_TABLET | Freq: Two times a day (BID) | ORAL | 1 refills | Status: DC

## 2018-07-15 NOTE — Progress Notes (Signed)
Subjective:    Patient ID: Philip Richardson, male    DOB: 07/26/1955, 63 y.o.   MRN: [id number]   Chief Complaint: medical management of chronic issues  HPI:  1. Essential hypertension  no c/o chest pain, sob or headache. Does not check his blood pressure at home. BP Readings from Last 3 Encounters:  04/08/18 (!) 147/78  01/07/18 140/76  10/08/17 127/68     2. Hyperlipidemia with target LDL less than 100  Watches diet and exercises frequently  3. Type 2 diabetes mellitus without complication, without long-term current use of insulin ([place]) last hgba1c  was 7.2%. his blood sugars run around 130 fasting when he checks them. He denies any symptoms f low blood sugar.    Outpatient Encounter Medications as of 07/15/2018  Medication Sig  . glimepiride (AMARYL) 2 MG tablet Take 1 tablet (2 mg total) by mouth daily with breakfast.  . lisinopril-hydrochlorothiazide (ZESTORETIC) 20-12.5 MG tablet Take 2 tablets by mouth daily.  . metFORMIN (GLUCOPHAGE) 1000 MG tablet Take 1 tablet (1,000 mg total) by mouth 2 (two) times daily with a meal.  . omeprazole (PRILOSEC) 40 MG capsule Take 1 capsule (40 mg total) by mouth daily.  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   Review of Systems  Constitutional: Negative for activity change and appetite change.  HENT: Negative.   Eyes: Negative for pain.  Respiratory: Negative for shortness of breath.   Cardiovascular: Negative for chest pain, palpitations and leg swelling.  Gastrointestinal: Negative for abdominal pain.  Endocrine: Negative for polydipsia.  Genitourinary: Negative.   Skin: Negative for rash.  Neurological: Negative for dizziness, weakness and headaches.  Hematological: Does not bruise/bleed easily.  Psychiatric/Behavioral: Negative.   All other systems reviewed and are negative.      Objective:   Physical  Exam Vitals signs and nursing note reviewed.  Constitutional:      Appearance: Normal appearance. He is well-developed.  HENT:     Head: Normocephalic.     Right Ear: Tympanic membrane and external ear normal.     Left Ear: Tympanic membrane, ear canal and external ear normal.     Ears:     Comments: Right ear canal has yellowish excudate    Nose: Nose normal.  Eyes:     Pupils: Pupils are equal, round, and reactive to light.  Neck:     Musculoskeletal: Normal range of motion and neck supple.     Thyroid: No thyroid mass or thyromegaly.     Vascular: No carotid bruit or JVD.     Trachea: Phonation normal.  Cardiovascular:     Rate and Rhythm: Normal rate and regular rhythm.  Pulmonary:     Effort: Pulmonary effort is normal. No respiratory distress.     Breath sounds: Normal breath sounds.  Abdominal:     General: Bowel sounds are normal.     Palpations: Abdomen is soft.     Tenderness: There is no abdominal tenderness.  Musculoskeletal: Normal range of motion.  Lymphadenopathy:     Cervical: No cervical adenopathy.  Skin:    General: Skin is warm and dry.  Neurological:     Mental Status: He is alert and oriented to person, place, and time.  Psychiatric:        Behavior: Behavior normal.        Thought Content: Thought  content normal.        Judgment: Judgment normal.     BP (!) 165/75   Pulse 69   Temp (!) 97 F (36.1 C) (Oral)   Ht _0  (1.88 m)   Wt 189 lb (85.7 kg)   BMI 24.27 kg/m   hgba1c 6.9%    Assessment & Plan:  Philip Richardson comes in today with chief complaint of Medical Management of Chronic Issues   Diagnosis and orders addressed:  1. Essential hypertension Low sodium diet - CMP14+EGFR - lisinopril-hydrochlorothiazide (ZESTORETIC) 20-12.5 MG tablet; Take 2 tablets by mouth daily.  Dispense: 180 tablet; Refill: 1  2. Hyperlipidemia with target LDL less than 100 Low fat diet - Lipid panel  3. Type 2 diabetes mellitus without  complication, without long-term current use of insulin (HCC) Continue to wtach carbs in diet - Bayer DCA Hb A1c Waived - Microalbumin / creatinine urine ratio - glimepiride (AMARYL) 2 MG tablet; Take 1 tablet (2 mg total) by mouth daily with breakfast.  Dispense: 90 tablet; Refill: 1 - metFORMIN (GLUCOPHAGE) 1000 MG tablet; Take 1 tablet (1,000 mg total) by mouth 2 (two) times daily with a meal.  Dispense: 180 tablet; Refill: 1  4. Acute otitis externa of right ear, unspecified type Avoid getting water in ears   Labs pending Health Maintenance reviewed Diet and exercise encouraged  Follow up plan: 3 months   [place], FNP

## 2018-07-15 NOTE — Patient Instructions (Signed)
Diabetes Mellitus and Foot Care  Foot care is an important part of your health, especially when you have diabetes. Diabetes may cause you to have problems because of poor blood flow (circulation) to your feet and legs, which can cause your skin to:   Become thinner and drier.   Break more easily.   Heal more slowly.   Peel and crack.  You may also have nerve damage (neuropathy) in your legs and feet, causing decreased feeling in them. This means that you may not notice minor injuries to your feet that could lead to more serious problems. Noticing and addressing any potential problems early is the best way to prevent future foot problems.  How to care for your feet  Foot hygiene   Wash your feet daily with warm water and mild soap. Do not use hot water. Then, pat your feet and the areas between your toes until they are completely dry. Do not soak your feet as this can dry your skin.   Trim your toenails straight across. Do not dig under them or around the cuticle. File the edges of your nails with an emery board or nail file.   Apply a moisturizing lotion or petroleum jelly to the skin on your feet and to dry, brittle toenails. Use lotion that does not contain alcohol and is unscented. Do not apply lotion between your toes.  Shoes and socks   Wear clean socks or stockings every day. Make sure they are not too tight. Do not wear knee-high stockings since they may decrease blood flow to your legs.   Wear shoes that fit properly and have enough cushioning. Always look in your shoes before you put them on to be sure there are no objects inside.   To break in new shoes, wear them for just a few hours a day. This prevents injuries on your feet.  Wounds, scrapes, corns, and calluses   Check your feet daily for blisters, cuts, bruises, sores, and redness. If you cannot see the bottom of your feet, use a mirror or ask someone for help.   Do not cut corns or calluses or try to remove them with medicine.   If you  find a minor scrape, cut, or break in the skin on your feet, keep it and the skin around it clean and dry. You may clean these areas with mild soap and water. Do not clean the area with peroxide, alcohol, or iodine.   If you have a wound, scrape, corn, or callus on your foot, look at it several times a day to make sure it is healing and not infected. Check for:  ? Redness, swelling, or pain.  ? Fluid or blood.  ? Warmth.  ? Pus or a bad smell.  General instructions   Do not cross your legs. This may decrease blood flow to your feet.   Do not use heating pads or hot water bottles on your feet. They may burn your skin. If you have lost feeling in your feet or legs, you may not know this is happening until it is too late.   Protect your feet from hot and cold by wearing shoes, such as at the beach or on hot pavement.   Schedule a complete foot exam at least once a year (annually) or more often if you have foot problems. If you have foot problems, report any cuts, sores, or bruises to your health care provider immediately.  Contact a health care provider if:     You have a medical condition that increases your risk of infection and you have any cuts, sores, or bruises on your feet.   You have an injury that is not healing.   You have redness on your legs or feet.   You feel burning or tingling in your legs or feet.   You have pain or cramps in your legs and feet.   Your legs or feet are numb.   Your feet always feel cold.   You have pain around a toenail.  Get help right away if:   You have a wound, scrape, corn, or callus on your foot and:  ? You have pain, swelling, or redness that gets worse.  ? You have fluid or blood coming from the wound, scrape, corn, or callus.  ? Your wound, scrape, corn, or callus feels warm to the touch.  ? You have pus or a bad smell coming from the wound, scrape, corn, or callus.  ? You have a fever.  ? You have a red line going up your leg.  Summary   Check your feet every day  for cuts, sores, red spots, swelling, and blisters.   Moisturize feet and legs daily.   Wear shoes that fit properly and have enough cushioning.   If you have foot problems, report any cuts, sores, or bruises to your health care provider immediately.   Schedule a complete foot exam at least once a year (annually) or more often if you have foot problems.  This information is not intended to replace advice given to you by your health care provider. Make sure you discuss any questions you have with your health care provider.  Document Released: 05/18/2000 Document Revised: 07/03/2017 Document Reviewed: 06/22/2016  Elsevier Interactive Patient Education  2019 [employer]vier Inc[employer]

## 2018-07-16 LAB — CMP14+EGFR
A/G RATIO: 2 (ref 1.2–2.2)
ALK PHOS: 76 IU/L (ref 39–117)
ALT: 24 IU/L (ref 0–44)
AST: 17 IU/L (ref 0–40)
Albumin: 4.2 g/dL (ref 3.8–4.8)
BUN/Creatinine Ratio: 14 (ref 10–24)
BUN: 24 mg/dL (ref 8–27)
Bilirubin Total: <0.2 mg/dL (ref 0.0–1.2)
CALCIUM: 9.8 mg/dL (ref 8.6–10.2)
CO2: 25 mmol/L (ref 20–29)
Chloride: 99 mmol/L (ref 96–106)
Creatinine, Ser: 1.69 mg/dL — ABNORMAL HIGH (ref 0.76–1.27)
GFR calc Af Amer: 49 mL/min/{1.73_m2} — ABNORMAL LOW (ref >59)
GFR, EST NON AFRICAN AMERICAN: 43 mL/min/{1.73_m2} — AB (ref >59)
Globulin, Total: 2.1 g/dL (ref 1.5–4.5)
Glucose: 178 mg/dL — ABNORMAL HIGH (ref 65–99)
Potassium: 4.3 mmol/L (ref 3.5–5.2)
Sodium: 142 mmol/L (ref 134–144)
Total Protein: 6.3 g/dL (ref 6.0–8.5)

## 2018-07-16 LAB — LIPID PANEL
CHOLESTEROL TOTAL: 169 mg/dL (ref 100–199)
Chol/HDL Ratio: 4.4 ratio (ref 0.0–5.0)
HDL: 38 mg/dL — AB (ref >39)
LDL Calculated: 101 mg/dL — ABNORMAL HIGH (ref 0–99)
TRIGLYCERIDES: 149 mg/dL (ref 0–149)
VLDL Cholesterol Cal: 30 mg/dL (ref 5–40)

## 2018-08-21 DIAGNOSIS — H04123 Dry eye syndrome of bilateral lacrimal glands: Secondary | ICD-10-CM | POA: Diagnosis not present

## 2018-08-21 DIAGNOSIS — H40033 Anatomical narrow angle, bilateral: Secondary | ICD-10-CM | POA: Diagnosis not present

## 2018-08-22 DIAGNOSIS — H10013 Acute follicular conjunctivitis, bilateral: Secondary | ICD-10-CM | POA: Diagnosis not present

## 2018-10-14 ENCOUNTER — Other Ambulatory Visit: Payer: Self-pay

## 2018-10-14 ENCOUNTER — Ambulatory Visit (INDEPENDENT_AMBULATORY_CARE_PROVIDER_SITE_OTHER): Payer: BLUE CROSS/BLUE SHIELD | Admitting: Nurse Practitioner

## 2018-10-14 ENCOUNTER — Encounter: Payer: Self-pay | Admitting: Nurse Practitioner

## 2018-10-14 DIAGNOSIS — I10 Essential (primary) hypertension: Secondary | ICD-10-CM

## 2018-10-14 DIAGNOSIS — E785 Hyperlipidemia, unspecified: Secondary | ICD-10-CM

## 2018-10-14 DIAGNOSIS — E119 Type 2 diabetes mellitus without complications: Secondary | ICD-10-CM

## 2018-10-14 MED ORDER — METFORMIN HCL 1000 MG PO TABS: 1000.0000 mg | ORAL_TABLET | Freq: Two times a day (BID) | ORAL | 1 refills | Status: DC

## 2018-10-14 MED ORDER — GLIMEPIRIDE 2 MG PO TABS: 2.0000 mg | ORAL_TABLET | Freq: Every day | ORAL | 1 refills | Status: DC

## 2018-10-14 MED ORDER — LISINOPRIL-HYDROCHLOROTHIAZIDE 20-12.5 MG PO TABS: 2.0000 | ORAL_TABLET | Freq: Every day | ORAL | 1 refills | Status: DC

## 2018-10-14 NOTE — Progress Notes (Signed)
Virtual Visit via telephone Note  I connected with Philip Richardson on 10/14/18 at 3:40PM by telephone and verified that I am speaking with the correct person using two identifiers. Philip Richardson is currently located at home and no one is currently with her during visit. The provider, Mary-Margaret Hassell Done, FNP is located in their office at time of visit.  I discussed the limitations, risks, security and privacy concerns of performing an evaluation and management service by telephone and the availability of in person appointments. I also discussed with the patient that there may be a patient responsible charge related to this service. The patient expressed understanding and agreed to proceed.   History and Present Illness:   Chief Complaint: Medical Management of Chronic Issues    HPI:  1. Essential hypertension No c/o chest pain, sob or headache. Does not check blood pressure at home BP Readings from Last 3 Encounters:  07/15/18 (!) 165/75  04/08/18 (!) 147/78  01/07/18 140/76    2. Type 2 diabetes mellitus without complication, without long-term current use of insulin (HCC) Last hgba1c was 6.9. hois fasting blood sugars have remained below 130. He denies any symptoms of hypoglycemia  3. Hyperlipidemia with target LDL less than 100 Does not watch diet very closely. Stays busy and walks 2 miles a day   Outpatient Encounter Medications as of 10/14/2018  Medication Sig  . ciprofloxacin-dexamethasone (CIPRODEX) OTIC suspension Place 4 drops into the right ear 2 (two) times daily.  Marland Kitchen glimepiride (AMARYL) 2 MG tablet Take 1 tablet (2 mg total) by mouth daily with breakfast.  . lisinopril-hydrochlorothiazide (ZESTORETIC) 20-12.5 MG tablet Take 2 tablets by mouth daily.  . metFORMIN (GLUCOPHAGE) 1000 MG tablet Take 1 tablet (1,000 mg total) by mouth 2 (two) times daily with a meal.  . ONETOUCH DELICA LANCETS 65K MISC        New complaints:   None today  Social history: Lives  by hisself    Review of Systems  Constitutional: Negative for diaphoresis and weight loss.  Eyes: Negative for blurred vision, double vision and pain.  Respiratory: Negative for shortness of breath.   Cardiovascular: Negative for chest pain, palpitations, orthopnea and leg swelling.  Gastrointestinal: Negative for abdominal pain.  Skin: Negative for rash.  Neurological: Negative for dizziness, sensory change, loss of consciousness, weakness and headaches.  Endo/Heme/Allergies: Negative for polydipsia. Does not bruise/bleed easily.  Psychiatric/Behavioral: Negative for memory loss. The patient does not have insomnia.   All other systems reviewed and are negative.    Observations/Objective: Alert and oriented- answers all questions appropriately No distress  Assessment and Plan: [name] comes in today with chief complaint of Medical Management of Chronic Issues   Diagnosis and orders addressed:  1. Essential hypertension Low sodium diet - lisinopril-hydrochlorothiazide (ZESTORETIC) 20-12.5 MG tablet; Take 2 tablets by mouth daily.  Dispense: 180 tablet; Refill: 1  2. Type 2 diabetes mellitus without complication, without long-term current use of insulin (HCC) Continue to watch carbs in diet - glimepiride (AMARYL) 2 MG tablet; Take 1 tablet (2 mg total) by mouth daily with breakfast.  Dispense: 90 tablet; Refill: 1 - metFORMIN (GLUCOPHAGE) 1000 MG tablet; Take 1 tablet (1,000 mg total) by mouth 2 (two) times daily with a meal.  Dispense: 180 tablet; Refill: 1  3. Hyperlipidemia with target LDL less than 100 Low fat diet   Previous lab results reviewed Health Maintenance reviewed Diet and exercise encouraged  Follow up plan: 3 months     I discussed  the assessment and treatment plan with the patient. The patient was provided an opportunity to ask questions and all were answered. The patient agreed with the plan and demonstrated an understanding of the  instructions.   The patient was advised to call back or seek an in-person evaluation if the symptoms worsen or if the condition fails to improve as anticipated.  The above assessment and management plan was discussed with the patient. The patient verbalized understanding of and has agreed to the management plan. Patient is aware to call the clinic if symptoms persist or worsen. Patient is aware when to return to the clinic for a follow-up visit. Patient educated on when it is appropriate to go to the emergency department.   Time call ended:  3:55 PM  I provided 17 minutes of non-face-to-face time during this encounter.    Mary-Margaret Hassell Done, FNP

## 2019-01-14 ENCOUNTER — Encounter: Payer: Self-pay | Admitting: Nurse Practitioner

## 2019-01-14 ENCOUNTER — Ambulatory Visit: Payer: BC Managed Care – PPO | Admitting: Nurse Practitioner

## 2019-01-14 ENCOUNTER — Other Ambulatory Visit: Payer: Self-pay

## 2019-01-14 VITALS — BP 161/83 | HR 70 | Temp 97.8°F | Ht 74.0 in | Wt 187.0 lb

## 2019-01-14 DIAGNOSIS — E785 Hyperlipidemia, unspecified: Secondary | ICD-10-CM | POA: Diagnosis not present

## 2019-01-14 DIAGNOSIS — I10 Essential (primary) hypertension: Secondary | ICD-10-CM

## 2019-01-14 DIAGNOSIS — E119 Type 2 diabetes mellitus without complications: Secondary | ICD-10-CM | POA: Diagnosis not present

## 2019-01-14 LAB — BAYER DCA HB A1C WAIVED: HB A1C (BAYER DCA - WAIVED): 8 % — ABNORMAL HIGH

## 2019-01-14 MED ORDER — AMLODIPINE BESYLATE 10 MG PO TABS: 10.0000 mg | ORAL_TABLET | Freq: Every day | ORAL | 3 refills | Status: DC

## 2019-01-14 NOTE — Patient Instructions (Signed)
Carbohydrate Counting for Diabetes Mellitus, Adult  Carbohydrate counting is a method of keeping track of how many carbohydrates you eat. Eating carbohydrates naturally increases the amount of sugar (glucose) in the blood. Counting how many carbohydrates you eat helps keep your blood glucose within normal limits, which helps you manage your diabetes (diabetes mellitus). It is important to know how many carbohydrates you can safely have in each meal. This is different for every person. A diet and nutrition specialist (registered dietitian) can help you make a meal plan and calculate how many carbohydrates you should have at each meal and snack. Carbohydrates are found in the following foods:  Grains, such as breads and cereals.  Dried beans and soy products.  Starchy vegetables, such as potatoes, peas, and corn.  Fruit and fruit juices.  Milk and yogurt.  Sweets and snack foods, such as cake, cookies, candy, chips, and soft drinks. How do I count carbohydrates? There are two ways to count carbohydrates in food. You can use either of the methods or a combination of both. Reading "Nutrition Facts" on packaged food The "Nutrition Facts" list is included on the labels of almost all packaged foods and beverages in the [place] It includes:  The serving size.  Information about nutrients in each serving, including the grams (g) of carbohydrate per serving. To use the "Nutrition Facts":  Decide how many servings you will have.  Multiply the number of servings by the number of carbohydrates per serving.  The resulting number is the total amount of carbohydrates that you will be having. Learning standard serving sizes of other foods When you eat carbohydrate foods that are not packaged or do not include "Nutrition Facts" on the label, you need to measure the servings in order to count the amount of carbohydrates:  Measure the foods that you will eat with a food scale or measuring cup, if needed.   Decide how many standard-size servings you will eat.  Multiply the number of servings by 15. Most carbohydrate-rich foods have about 15 g of carbohydrates per serving. ? For example, if you eat 8 oz (170 g) of strawberries, you will have eaten 2 servings and 30 g of carbohydrates (2 servings x 15 g = 30 g).  For foods that have more than one food mixed, such as soups and casseroles, you must count the carbohydrates in each food that is included. The following list contains standard serving sizes of common carbohydrate-rich foods. Each of these servings has about 15 g of carbohydrates:   hamburger bun or  English muffin.   oz (15 mL) syrup.   oz (14 g) jelly.  1 slice of bread.  1 six-inch tortilla.  3 oz (85 g) cooked rice or pasta.  4 oz (113 g) cooked dried beans.  4 oz (113 g) starchy vegetable, such as peas, corn, or potatoes.  4 oz (113 g) hot cereal.  4 oz (113 g) mashed potatoes or  of a large baked potato.  4 oz (113 g) canned or frozen fruit.  4 oz (120 mL) fruit juice.  4-6 crackers.  6 chicken nuggets.  6 oz (170 g) unsweetened dry cereal.  6 oz (170 g) plain fat-free yogurt or yogurt sweetened with artificial sweeteners.  8 oz (240 mL) milk.  8 oz (170 g) fresh fruit or one small piece of fruit.  24 oz (680 g) popped popcorn. Example of carbohydrate counting Sample meal  3 oz (85 g) chicken breast.  6 oz (170 g)   brown rice.  4 oz (113 g) corn.  8 oz (240 mL) milk.  8 oz (170 g) strawberries with sugar-free whipped topping. Carbohydrate calculation 1. Identify the foods that contain carbohydrates: ? Rice. ? Corn. ? Milk. ? Strawberries. 2. Calculate how many servings you have of each food: ? 2 servings rice. ? 1 serving corn. ? 1 serving milk. ? 1 serving strawberries. 3. Multiply each number of servings by 15 g: ? 2 servings rice x 15 g = 30 g. ? 1 serving corn x 15 g = 15 g. ? 1 serving milk x 15 g = 15 g. ? 1 serving  strawberries x 15 g = 15 g. 4. Add together all of the amounts to find the total grams of carbohydrates eaten: ? 30 g + 15 g + 15 g + 15 g = 75 g of carbohydrates total. Summary  Carbohydrate counting is a method of keeping track of how many carbohydrates you eat.  Eating carbohydrates naturally increases the amount of sugar (glucose) in the blood.  Counting how many carbohydrates you eat helps keep your blood glucose within normal limits, which helps you manage your diabetes.  A diet and nutrition specialist (registered dietitian) can help you make a meal plan and calculate how many carbohydrates you should have at each meal and snack. This information is not intended to replace advice given to you by your health care provider. Make sure you discuss any questions you have with your health care provider. Document Released: 05/21/2005 Document Revised: 12/13/2016 Document Reviewed: 11/02/2015 Elsevier Patient Education  2020 Elsevier Inc.  

## 2019-01-14 NOTE — Progress Notes (Signed)
Subjective:    Patient ID: Philip Richardson, male    DOB: 12-29-1955, 63 y.o.   MRN: [id number]     Subjective:    Patient ID: Philip Richardson, male    DOB: 03-28-1956, 63 y.o.   MRN: [id number]  .  Chief Complaint: medical management of chronic issues   HPI:  1. Essential hypertension No c/o chest pain, sob or headache. Does not check blood pressure at home. BP Readings from Last 3 Encounters:  07/15/18 (!) 165/75  04/08/18 (!) 147/78  01/07/18 140/76     2. Hyperlipidemia with target LDL less than 100 Watches diet but does very little exercise  3. Type 2 diabetes mellitus without complication, without long-term current use of insulin (HCC) Last HGBA1c was 6.9%. his fasting blood sugars when he checks them are running below 140. He denies any low blood sugars.    Outpatient Encounter Medications as of 01/14/2019  Medication Sig  . ciprofloxacin-dexamethasone (CIPRODEX) OTIC suspension Place 4 drops into the right ear 2 (two) times daily.  Marland Kitchen glimepiride (AMARYL) 2 MG tablet Take 1 tablet (2 mg total) by mouth daily with breakfast.  . lisinopril-hydrochlorothiazide (ZESTORETIC) 20-12.5 MG tablet Take 2 tablets by mouth daily.  . metFORMIN (GLUCOPHAGE) 1000 MG tablet Take 1 tablet (1,000 mg total) by mouth 2 (two) times daily with a meal.  . ONETOUCH DELICA LANCETS 15V MISC      Past Surgical History:  Procedure Laterality Date  . CYSTOSCOPY W/ URETERAL STENT PLACEMENT Bilateral 12/09/2013   Procedure: CYSTOSCOPY WITH RETROGRADE PYELOGRAM/URETERAL STENT PLACEMENT;  Surgeon: Alexis Frock, MD;  Location: [facility];  Service: Urology;  Laterality: Bilateral;  . CYSTOSCOPY WITH RETROGRADE PYELOGRAM, URETEROSCOPY AND STENT PLACEMENT Bilateral 09/30/2013   Procedure: CYSTOSCOPY WITH BILATERAL RETROGRADE PYELOGRAM, LEFT DIAGNOSTIC URETEROSCOPY AND Left ureteral stent;  Surgeon: Alexis Frock, MD;  Location: [facility];  Service: Urology;  Laterality: Bilateral;  .  PERCUTANEOUS NEPHROLITHOTRIPSY  2005  . ROBOT ASSISTED PYELOPLASTY N/A 12/09/2013   Procedure: ROBOTIC ASSISTED BILATERAL PYELOLITHOTOMY, RIGHT  PYELOPLASTY ;  Surgeon: Alexis Frock, MD;  Location: [facility];  Service: Urology;  Laterality: N/A;    History reviewed. No pertinent family history.  New complaints: None today  Social history: He quit his job at [employer] - now he is retired  Armed forces technical officer: N/A   Review of Systems  Constitutional: Negative for activity change and appetite change.  HENT: Negative.   Eyes: Negative for pain.  Respiratory: Negative for shortness of breath.   Cardiovascular: Negative for chest pain, palpitations and leg swelling.  Gastrointestinal: Negative for abdominal pain.  Endocrine: Negative for polydipsia.  Genitourinary: Negative.   Skin: Negative for rash.  Neurological: Negative for dizziness, weakness and headaches.  Hematological: Does not bruise/bleed easily.  Psychiatric/Behavioral: Negative.   All other systems reviewed and are negative.      Objective:   Physical Exam Vitals signs and nursing note reviewed.  Constitutional:      Appearance: Normal appearance. He is well-developed.  HENT:     Head: Normocephalic.     Nose: Nose normal.  Eyes:     Pupils: Pupils are equal, round, and reactive to light.  Neck:     Musculoskeletal: Normal range of motion and neck supple.     Thyroid: No thyroid mass or thyromegaly.     Vascular: No carotid bruit or JVD.     Trachea: Phonation normal.  Cardiovascular:     Rate and Rhythm: Normal rate and  regular rhythm.  Pulmonary:     Effort: Pulmonary effort is normal. No respiratory distress.     Breath sounds: Normal breath sounds.  Abdominal:     General: Bowel sounds are normal.     Palpations: Abdomen is soft.     Tenderness: There is no abdominal tenderness.  Musculoskeletal: Normal range of motion.  Lymphadenopathy:     Cervical: No cervical adenopathy.  Skin:     General: Skin is warm and dry.  Neurological:     Mental Status: He is alert and oriented to person, place, and time.  Psychiatric:        Behavior: Behavior normal.        Thought Content: Thought content normal.        Judgment: Judgment normal.    BP (!) 161/83   Pulse 70   Temp 97.8 F (36.6 C) (Oral)   Ht '6\' 2"'  (1.88 m)   Wt 187 lb (84.8 kg)   BMI 24.01 kg/m   hgba1c 8.0      Assessment & Plan:  Mykle Pascua comes in today with chief complaint of Medical Management of Chronic Issues   Diagnosis and orders addressed:  1. Essential hypertension Low sodium diet Added amlodipine 36m to meds - CMP14+EGFR  2. Hyperlipidemia with target LDL less than 100 Low fat  diet - Lipid panel  3. Type 2 diabetes mellitus without complication, without long-term current use of insulin (HCC) Continue to watch carbs in diet Will give 1 month to trend downward - Bayer DCA Hb A1c Waived - Microalbumin / creatinine urine ratio   Labs pending Health Maintenance reviewed Diet and exercise encouraged  Follow up plan: 1 month   MIsabel FNP

## 2019-01-15 LAB — CMP14+EGFR
ALT: 28 IU/L (ref 0–44)
AST: 23 IU/L (ref 0–40)
Albumin/Globulin Ratio: 1.8 (ref 1.2–2.2)
Albumin: 4.1 g/dL (ref 3.8–4.8)
Alkaline Phosphatase: 76 IU/L (ref 39–117)
BUN/Creatinine Ratio: 12 (ref 10–24)
BUN: 20 mg/dL (ref 8–27)
Bilirubin Total: 0.2 mg/dL (ref 0.0–1.2)
CO2: 25 mmol/L (ref 20–29)
Calcium: 10 mg/dL (ref 8.6–10.2)
Chloride: 95 mmol/L — ABNORMAL LOW (ref 96–106)
Creatinine, Ser: 1.68 mg/dL — ABNORMAL HIGH (ref 0.76–1.27)
GFR calc Af Amer: 50 mL/min/{1.73_m2} — ABNORMAL LOW (ref >59)
GFR calc non Af Amer: 43 mL/min/{1.73_m2} — ABNORMAL LOW (ref >59)
Globulin, Total: 2.3 g/dL (ref 1.5–4.5)
Glucose: 238 mg/dL — ABNORMAL HIGH (ref 65–99)
Potassium: 4.7 mmol/L (ref 3.5–5.2)
Sodium: 136 mmol/L (ref 134–144)
Total Protein: 6.4 g/dL (ref 6.0–8.5)

## 2019-01-15 LAB — LIPID PANEL
Chol/HDL Ratio: 4.9 ratio (ref 0.0–5.0)
Cholesterol, Total: 183 mg/dL (ref 100–199)
HDL: 37 mg/dL — ABNORMAL LOW (ref >39)
LDL Calculated: 97 mg/dL (ref 0–99)
Triglycerides: 243 mg/dL — ABNORMAL HIGH (ref 0–149)
VLDL Cholesterol Cal: 49 mg/dL — ABNORMAL HIGH (ref 5–40)

## 2019-02-17 LAB — HM DIABETES EYE EXAM

## 2019-02-24 ENCOUNTER — Encounter: Payer: Self-pay | Admitting: Nurse Practitioner

## 2019-02-24 ENCOUNTER — Ambulatory Visit: Payer: BC Managed Care – PPO | Admitting: Nurse Practitioner

## 2019-02-24 ENCOUNTER — Other Ambulatory Visit: Payer: Self-pay

## 2019-02-24 VITALS — BP 138/71 | HR 69 | Temp 97.8°F | Ht 74.0 in | Wt 185.0 lb

## 2019-02-24 DIAGNOSIS — E785 Hyperlipidemia, unspecified: Secondary | ICD-10-CM | POA: Diagnosis not present

## 2019-02-24 DIAGNOSIS — E119 Type 2 diabetes mellitus without complications: Secondary | ICD-10-CM

## 2019-02-24 DIAGNOSIS — Z125 Encounter for screening for malignant neoplasm of prostate: Secondary | ICD-10-CM | POA: Diagnosis not present

## 2019-02-24 DIAGNOSIS — I10 Essential (primary) hypertension: Secondary | ICD-10-CM

## 2019-02-24 LAB — BAYER DCA HB A1C WAIVED: HB A1C (BAYER DCA - WAIVED): 8.1 % — ABNORMAL HIGH (ref <7.0)

## 2019-02-24 MED ORDER — GLIMEPIRIDE 2 MG PO TABS: 2.0000 mg | ORAL_TABLET | Freq: Every day | ORAL | 1 refills | Status: DC

## 2019-02-24 MED ORDER — METFORMIN HCL 1000 MG PO TABS: 1000.0000 mg | ORAL_TABLET | Freq: Two times a day (BID) | ORAL | 1 refills | Status: DC

## 2019-02-24 MED ORDER — LISINOPRIL-HYDROCHLOROTHIAZIDE 20-12.5 MG PO TABS: 2.0000 | ORAL_TABLET | Freq: Every day | ORAL | 1 refills | Status: DC

## 2019-02-24 NOTE — Patient Instructions (Signed)
Diabetes Mellitus and Foot Care Foot care is an important part of your health, especially when you have diabetes. Diabetes may cause you to have problems because of poor blood flow (circulation) to your feet and legs, which can cause your skin to:  Become thinner and drier.  Break more easily.  Heal more slowly.  Peel and crack. You may also have nerve damage (neuropathy) in your legs and feet, causing decreased feeling in them. This means that you may not notice minor injuries to your feet that could lead to more serious problems. Noticing and addressing any potential problems early is the best way to prevent future foot problems. How to care for your feet Foot hygiene  Wash your feet daily with warm water and mild soap. Do not use hot water. Then, pat your feet and the areas between your toes until they are completely dry. Do not soak your feet as this can dry your skin.  Trim your toenails straight across. Do not dig under them or around the cuticle. File the edges of your nails with an emery board or nail file.  Apply a moisturizing lotion or petroleum jelly to the skin on your feet and to dry, brittle toenails. Use lotion that does not contain alcohol and is unscented. Do not apply lotion between your toes. Shoes and socks  Wear clean socks or stockings every day. Make sure they are not too tight. Do not wear knee-high stockings since they may decrease blood flow to your legs.  Wear shoes that fit properly and have enough cushioning. Always look in your shoes before you put them on to be sure there are no objects inside.  To break in new shoes, wear them for just a few hours a day. This prevents injuries on your feet. Wounds, scrapes, corns, and calluses  Check your feet daily for blisters, cuts, bruises, sores, and redness. If you cannot see the bottom of your feet, use a mirror or ask someone for help.  Do not cut corns or calluses or try to remove them with medicine.  If you  find a minor scrape, cut, or break in the skin on your feet, keep it and the skin around it clean and dry. You may clean these areas with mild soap and water. Do not clean the area with peroxide, alcohol, or iodine.  If you have a wound, scrape, corn, or callus on your foot, look at it several times a day to make sure it is healing and not infected. Check for: ? Redness, swelling, or pain. ? Fluid or blood. ? Warmth. ? Pus or a bad smell. General instructions  Do not cross your legs. This may decrease blood flow to your feet.  Do not use heating pads or hot water bottles on your feet. They may burn your skin. If you have lost feeling in your feet or legs, you may not know this is happening until it is too late.  Protect your feet from hot and cold by wearing shoes, such as at the beach or on hot pavement.  Schedule a complete foot exam at least once a year (annually) or more often if you have foot problems. If you have foot problems, report any cuts, sores, or bruises to your health care provider immediately. Contact a health care provider if:  You have a medical condition that increases your risk of infection and you have any cuts, sores, or bruises on your feet.  You have an injury that is not   healing.  You have redness on your legs or feet.  You feel burning or tingling in your legs or feet.  You have pain or cramps in your legs and feet.  Your legs or feet are numb.  Your feet always feel cold.  You have pain around a toenail. Get help right away if:  You have a wound, scrape, corn, or callus on your foot and: ? You have pain, swelling, or redness that gets worse. ? You have fluid or blood coming from the wound, scrape, corn, or callus. ? Your wound, scrape, corn, or callus feels warm to the touch. ? You have pus or a bad smell coming from the wound, scrape, corn, or callus. ? You have a fever. ? You have a red line going up your leg. Summary  Check your feet every day  for cuts, sores, red spots, swelling, and blisters.  Moisturize feet and legs daily.  Wear shoes that fit properly and have enough cushioning.  If you have foot problems, report any cuts, sores, or bruises to your health care provider immediately.  Schedule a complete foot exam at least once a year (annually) or more often if you have foot problems. This information is not intended to replace advice given to you by your health care provider. Make sure you discuss any questions you have with your health care provider. Document Released: 05/18/2000 Document Revised: 07/03/2017 Document Reviewed: 06/22/2016 Elsevier Patient Education  2020 Elsevier Inc.  

## 2019-02-24 NOTE — Progress Notes (Signed)
Subjective:    Patient ID: Philip Richardson, male    DOB: 27-Apr-1956, 63 y.o.   MRN: [id number]   Chief Complaint: Medical Management of Chronic Issues    HPI:  1. Essential hypertension No c/o chest pain, sob or headache. Does not check blood pressure  At home. BP Readings from Last 3 Encounters:  02/24/19 (!) 147/76  01/14/19 (!) 161/83  07/15/18 (!) 165/75     2. Hyperlipidemia with target LDL less than 100 Doe snot really watch diet and does no exercise. Lab Results  Component Value Date   CHOL 183 01/14/2019   HDL 37 (L) 01/14/2019   LDLCALC 97 01/14/2019   LDLDIRECT 113 (H) 04/08/2018   TRIG 243 (H) 01/14/2019   CHOLHDL 4.9 01/14/2019     3. Type 2 diabetes mellitus without complication, without long-term current use of insulin (HCC) His fasting blood sugars at home are running around 130-150. He does ot check it every day. He denies any symptoms of low blood sugar. Lab Results  Component Value Date   HGBA1C 8.0 (H) 01/14/2019     4. Screening for prostate cancer Lab Results  Component Value Date   PSA 0.5 08/19/2013       Outpatient Encounter Medications as of 02/24/2019  Medication Sig  . amLODipine (NORVASC) 10 MG tablet Take 1 tablet (10 mg total) by mouth daily.  Marland Kitchen glimepiride (AMARYL) 2 MG tablet Take 1 tablet (2 mg total) by mouth daily with breakfast.  . lisinopril-hydrochlorothiazide (ZESTORETIC) 20-12.5 MG tablet Take 2 tablets by mouth daily.  . metFORMIN (GLUCOPHAGE) 1000 MG tablet Take 1 tablet (1,000 mg total) by mouth 2 (two) times daily with a meal.  . ONETOUCH DELICA LANCETS 63A MISC      Past Surgical History:  Procedure Laterality Date  . CYSTOSCOPY W/ URETERAL STENT PLACEMENT Bilateral 12/09/2013   Procedure: CYSTOSCOPY WITH RETROGRADE PYELOGRAM/URETERAL STENT PLACEMENT;  Surgeon: Alexis Frock, MD;  Location: [facility];  Service: Urology;  Laterality: Bilateral;  . CYSTOSCOPY WITH RETROGRADE PYELOGRAM, URETEROSCOPY AND STENT  PLACEMENT Bilateral 09/30/2013   Procedure: CYSTOSCOPY WITH BILATERAL RETROGRADE PYELOGRAM, LEFT DIAGNOSTIC URETEROSCOPY AND Left ureteral stent;  Surgeon: Alexis Frock, MD;  Location: [facility];  Service: Urology;  Laterality: Bilateral;  . PERCUTANEOUS NEPHROLITHOTRIPSY  2005  . ROBOT ASSISTED PYELOPLASTY N/A 12/09/2013   Procedure: ROBOTIC ASSISTED BILATERAL PYELOLITHOTOMY, RIGHT  PYELOPLASTY ;  Surgeon: Alexis Frock, MD;  Location: [facility];  Service: Urology;  Laterality: N/A;    History reviewed. No pertinent family history.  New complaints: none today  Social history: Lives by hisself- is retired  Controlled substance contract: n/a    Review of Systems  Constitutional: Negative for activity change and appetite change.  HENT: Negative.   Eyes: Negative for pain.  Respiratory: Negative for shortness of breath.   Cardiovascular: Negative for chest pain, palpitations and leg swelling.  Gastrointestinal: Negative for abdominal pain.  Endocrine: Negative for polydipsia.  Genitourinary: Negative.   Skin: Negative for rash.  Neurological: Negative for dizziness, weakness and headaches.  Hematological: Does not bruise/bleed easily.  Psychiatric/Behavioral: Negative.   All other systems reviewed and are negative.      Objective:   Physical Exam Vitals signs and nursing note reviewed.  Constitutional:      Appearance: Normal appearance. He is well-developed.  HENT:     Head: Normocephalic.     Nose: Nose normal.  Eyes:     Pupils: Pupils are equal, round, and reactive to light.  Neck:     Musculoskeletal: Normal range of motion and neck supple.     Thyroid: No thyroid mass or thyromegaly.     Vascular: No carotid bruit or JVD.     Trachea: Phonation normal.  Cardiovascular:     Rate and Rhythm: Normal rate and regular rhythm.  Pulmonary:     Effort: Pulmonary effort is normal. No respiratory distress.     Breath sounds: Normal breath sounds.   Abdominal:     General: Bowel sounds are normal.     Palpations: Abdomen is soft.     Tenderness: There is no abdominal tenderness.  Musculoskeletal: Normal range of motion.  Lymphadenopathy:     Cervical: No cervical adenopathy.  Skin:    General: Skin is warm and dry.  Neurological:     Mental Status: He is alert and oriented to person, place, and time.  Psychiatric:        Behavior: Behavior normal.        Thought Content: Thought content normal.        Judgment: Judgment normal.    BP 138/71   Pulse 69   Temp 97.8 F (36.6 C) (Temporal)   Ht _0  (1.88 m)   Wt 185 lb (83.9 kg)   SpO2 98%   BMI 23.75 kg/m         Assessment & Plan:  Philip Richardson comes in today with chief complaint of Medical Management of Chronic Issues   Diagnosis and orders addressed:  1. Essential hypertension Low sodium diet - CMP14+EGFR - lisinopril-hydrochlorothiazide (ZESTORETIC) 20-12.5 MG tablet; Take 2 tablets by mouth daily.  Dispense: 180 tablet; Refill: 1  2. Hyperlipidemia with target LDL less than 100 Low fat diet - Lipid panel  3. Type 2 diabetes mellitus without complication, without long-term current use of insulin (HCC) Continue to watch carbs diet - Bayer DCA Hb A1c Waived - glimepiride (AMARYL) 2 MG tablet; Take 1 tablet (2 mg total) by mouth daily with breakfast.  Dispense: 90 tablet; Refill: 1 - metFORMIN (GLUCOPHAGE) 1000 MG tablet; Take 1 tablet (1,000 mg total) by mouth 2 (two) times daily with a meal.  Dispense: 180 tablet; Refill: 1  4. Screening for prostate cancer - PSA, total and free   Labs pending Health Maintenance reviewed Diet and exercise encouraged  Follow up plan: 3 months   [place], FNP

## 2019-02-25 LAB — PSA, TOTAL AND FREE
PSA, Free Pct: 51.4 %
PSA, Free: 0.36 ng/mL
Prostate Specific Ag, Serum: 0.7 ng/mL (ref 0.0–4.0)

## 2019-02-25 LAB — LIPID PANEL
Chol/HDL Ratio: 4.5 ratio (ref 0.0–5.0)
Cholesterol, Total: 191 mg/dL (ref 100–199)
HDL: 42 mg/dL (ref >39)
LDL Chol Calc (NIH): 122 mg/dL — ABNORMAL HIGH (ref 0–99)
Triglycerides: 154 mg/dL — ABNORMAL HIGH (ref 0–149)
VLDL Cholesterol Cal: 27 mg/dL (ref 5–40)

## 2019-02-25 LAB — CMP14+EGFR
ALT: 28 IU/L (ref 0–44)
AST: 21 IU/L (ref 0–40)
Albumin/Globulin Ratio: 1.6 (ref 1.2–2.2)
Albumin: 4.3 g/dL (ref 3.8–4.8)
Alkaline Phosphatase: 88 IU/L (ref 39–117)
BUN/Creatinine Ratio: 14 (ref 10–24)
BUN: 25 mg/dL (ref 8–27)
Bilirubin Total: 0.3 mg/dL (ref 0.0–1.2)
CO2: 24 mmol/L (ref 20–29)
Calcium: 10.4 mg/dL — ABNORMAL HIGH (ref 8.6–10.2)
Chloride: 99 mmol/L (ref 96–106)
Creatinine, Ser: 1.75 mg/dL — ABNORMAL HIGH (ref 0.76–1.27)
GFR calc Af Amer: 47 mL/min/{1.73_m2} — ABNORMAL LOW (ref >59)
GFR calc non Af Amer: 41 mL/min/{1.73_m2} — ABNORMAL LOW (ref >59)
Globulin, Total: 2.7 g/dL (ref 1.5–4.5)
Glucose: 169 mg/dL — ABNORMAL HIGH (ref 65–99)
Potassium: 4.8 mmol/L (ref 3.5–5.2)
Sodium: 140 mmol/L (ref 134–144)
Total Protein: 7 g/dL (ref 6.0–8.5)

## 2019-02-26 MED ORDER — ATORVASTATIN CALCIUM 40 MG PO TABS: 40.0000 mg | ORAL_TABLET | Freq: Every day | ORAL | 1 refills | Status: DC

## 2019-02-26 NOTE — Addendum Note (Signed)
Addended by: Chevis Pretty on: 02/26/2019 05:09 PM   Modules accepted: Orders

## 2019-02-27 ENCOUNTER — Telehealth: Payer: Self-pay | Admitting: Nurse Practitioner

## 2019-02-27 NOTE — Telephone Encounter (Signed)
Aware of results. 

## 2019-03-02 ENCOUNTER — Encounter: Payer: Self-pay | Admitting: *Deleted

## 2019-06-03 ENCOUNTER — Encounter: Payer: Self-pay | Admitting: Nurse Practitioner

## 2019-06-03 ENCOUNTER — Other Ambulatory Visit: Payer: Self-pay

## 2019-06-03 ENCOUNTER — Ambulatory Visit: Payer: BC Managed Care – PPO | Admitting: Nurse Practitioner

## 2019-06-03 VITALS — BP 138/73 | HR 80 | Temp 97.5°F | Resp 20 | Ht 74.0 in | Wt 193.0 lb

## 2019-06-03 DIAGNOSIS — E119 Type 2 diabetes mellitus without complications: Secondary | ICD-10-CM

## 2019-06-03 DIAGNOSIS — I10 Essential (primary) hypertension: Secondary | ICD-10-CM

## 2019-06-03 DIAGNOSIS — E785 Hyperlipidemia, unspecified: Secondary | ICD-10-CM

## 2019-06-03 LAB — BAYER DCA HB A1C WAIVED: HB A1C (BAYER DCA - WAIVED): 8.7 % — ABNORMAL HIGH (ref <7.0)

## 2019-06-03 MED ORDER — METFORMIN HCL 1000 MG PO TABS: 1000.0000 mg | ORAL_TABLET | Freq: Two times a day (BID) | ORAL | 1 refills | Status: DC

## 2019-06-03 MED ORDER — GLIMEPIRIDE 2 MG PO TABS: 2.0000 mg | ORAL_TABLET | Freq: Every day | ORAL | 1 refills | Status: DC

## 2019-06-03 MED ORDER — LISINOPRIL-HYDROCHLOROTHIAZIDE 20-12.5 MG PO TABS: 2.0000 | ORAL_TABLET | Freq: Every day | ORAL | 1 refills | Status: DC

## 2019-06-03 MED ORDER — GLIMEPIRIDE 4 MG PO TABS: 4.0000 mg | ORAL_TABLET | Freq: Every day | ORAL | 1 refills | Status: DC

## 2019-06-03 MED ORDER — AMLODIPINE BESYLATE 10 MG PO TABS: 10.0000 mg | ORAL_TABLET | Freq: Every day | ORAL | 1 refills | Status: DC

## 2019-06-03 MED ORDER — ATORVASTATIN CALCIUM 40 MG PO TABS: 40.0000 mg | ORAL_TABLET | Freq: Every day | ORAL | 1 refills | Status: DC

## 2019-06-03 NOTE — Patient Instructions (Signed)
Carbohydrate Counting for Diabetes Mellitus, Adult  Carbohydrate counting is a method of keeping track of how many carbohydrates you eat. Eating carbohydrates naturally increases the amount of sugar (glucose) in the blood. Counting how many carbohydrates you eat helps keep your blood glucose within normal limits, which helps you manage your diabetes (diabetes mellitus). It is important to know how many carbohydrates you can safely have in each meal. This is different for every person. A diet and nutrition specialist (registered dietitian) can help you make a meal plan and calculate how many carbohydrates you should have at each meal and snack. Carbohydrates are found in the following foods:  Grains, such as breads and cereals.  Dried beans and soy products.  Starchy vegetables, such as potatoes, peas, and corn.  Fruit and fruit juices.  Milk and yogurt.  Sweets and snack foods, such as cake, cookies, candy, chips, and soft drinks. How do I count carbohydrates? There are two ways to count carbohydrates in food. You can use either of the methods or a combination of both. Reading "Nutrition Facts" on packaged food The "Nutrition Facts" list is included on the labels of almost all packaged foods and beverages in the [place] It includes:  The serving size.  Information about nutrients in each serving, including the grams (g) of carbohydrate per serving. To use the "Nutrition Facts":  Decide how many servings you will have.  Multiply the number of servings by the number of carbohydrates per serving.  The resulting number is the total amount of carbohydrates that you will be having. Learning standard serving sizes of other foods When you eat carbohydrate foods that are not packaged or do not include "Nutrition Facts" on the label, you need to measure the servings in order to count the amount of carbohydrates:  Measure the foods that you will eat with a food scale or measuring cup, if needed.   Decide how many standard-size servings you will eat.  Multiply the number of servings by 15. Most carbohydrate-rich foods have about 15 g of carbohydrates per serving. ? For example, if you eat 8 oz (170 g) of strawberries, you will have eaten 2 servings and 30 g of carbohydrates (2 servings x 15 g = 30 g).  For foods that have more than one food mixed, such as soups and casseroles, you must count the carbohydrates in each food that is included. The following list contains standard serving sizes of common carbohydrate-rich foods. Each of these servings has about 15 g of carbohydrates:   hamburger bun or  English muffin.   oz (15 mL) syrup.   oz (14 g) jelly.  1 slice of bread.  1 six-inch tortilla.  3 oz (85 g) cooked rice or pasta.  4 oz (113 g) cooked dried beans.  4 oz (113 g) starchy vegetable, such as peas, corn, or potatoes.  4 oz (113 g) hot cereal.  4 oz (113 g) mashed potatoes or  of a large baked potato.  4 oz (113 g) canned or frozen fruit.  4 oz (120 mL) fruit juice.  4-6 crackers.  6 chicken nuggets.  6 oz (170 g) unsweetened dry cereal.  6 oz (170 g) plain fat-free yogurt or yogurt sweetened with artificial sweeteners.  8 oz (240 mL) milk.  8 oz (170 g) fresh fruit or one small piece of fruit.  24 oz (680 g) popped popcorn. Example of carbohydrate counting Sample meal  3 oz (85 g) chicken breast.  6 oz (170 g)   brown rice.  4 oz (113 g) corn.  8 oz (240 mL) milk.  8 oz (170 g) strawberries with sugar-free whipped topping. Carbohydrate calculation 1. Identify the foods that contain carbohydrates: ? Rice. ? Corn. ? Milk. ? Strawberries. 2. Calculate how many servings you have of each food: ? 2 servings rice. ? 1 serving corn. ? 1 serving milk. ? 1 serving strawberries. 3. Multiply each number of servings by 15 g: ? 2 servings rice x 15 g = 30 g. ? 1 serving corn x 15 g = 15 g. ? 1 serving milk x 15 g = 15 g. ? 1 serving  strawberries x 15 g = 15 g. 4. Add together all of the amounts to find the total grams of carbohydrates eaten: ? 30 g + 15 g + 15 g + 15 g = 75 g of carbohydrates total. Summary  Carbohydrate counting is a method of keeping track of how many carbohydrates you eat.  Eating carbohydrates naturally increases the amount of sugar (glucose) in the blood.  Counting how many carbohydrates you eat helps keep your blood glucose within normal limits, which helps you manage your diabetes.  A diet and nutrition specialist (registered dietitian) can help you make a meal plan and calculate how many carbohydrates you should have at each meal and snack. This information is not intended to replace advice given to you by your health care provider. Make sure you discuss any questions you have with your health care provider. Document Released: 05/21/2005 Document Revised: 12/13/2016 Document Reviewed: 11/02/2015 Elsevier Patient Education  2020 Elsevier Inc.  

## 2019-06-03 NOTE — Progress Notes (Signed)
Subjective:    Patient ID: Philip Richardson, male    DOB: [birth year], 63 y.o.   MRN: [id number]   Chief Complaint: Medical Management of Chronic Issues    HPI:  1. Essential hypertension No c/o chest pan, sob or headache. Does not check blood presure at home. BP Readings from Last 3 Encounters:  06/03/19 138/73  02/24/19 138/71  01/14/19 (!) 161/83     2. Hyperlipidemia with target LDL less than 100 Tries to watch diet but does very little exercise. Lab Results  Component Value Date   CHOL 191 02/24/2019   HDL 42 02/24/2019   LDLCALC 122 (H) 02/24/2019   LDLDIRECT 113 (H) 04/08/2018   TRIG 154 (H) 02/24/2019   CHOLHDL 4.5 02/24/2019     3. Type 2 diabetes mellitus without complication, without long-term current use of insulin (HCC) Fasting blood sugars running around 140. Denies any low blood sugars. No changes were made to meds at last visit Lab Results  Component Value Date   HGBA1C 8.1 (H) 02/24/2019       Outpatient Encounter Medications as of 06/03/2019  Medication Sig  . amLODipine (NORVASC) 10 MG tablet Take 1 tablet (10 mg total) by mouth daily.  Marland Kitchen atorvastatin (LIPITOR) 40 MG tablet Take 1 tablet (40 mg total) by mouth daily.  Marland Kitchen glimepiride (AMARYL) 2 MG tablet Take 1 tablet (2 mg total) by mouth daily with breakfast.  . lisinopril-hydrochlorothiazide (ZESTORETIC) 20-12.5 MG tablet Take 2 tablets by mouth daily.  . metFORMIN (GLUCOPHAGE) 1000 MG tablet Take 1 tablet (1,000 mg total) by mouth 2 (two) times daily with a meal.  . ONETOUCH DELICA LANCETS 07A MISC      Past Surgical History:  Procedure Laterality Date  . CYSTOSCOPY W/ URETERAL STENT PLACEMENT Bilateral 12/09/2013   Procedure: CYSTOSCOPY WITH RETROGRADE PYELOGRAM/URETERAL STENT PLACEMENT;  Surgeon: Alexis Frock, MD;  Location: [facility];  Service: Urology;  Laterality: Bilateral;  . CYSTOSCOPY WITH RETROGRADE PYELOGRAM, URETEROSCOPY AND STENT PLACEMENT Bilateral 09/30/2013   Procedure:  CYSTOSCOPY WITH BILATERAL RETROGRADE PYELOGRAM, LEFT DIAGNOSTIC URETEROSCOPY AND Left ureteral stent;  Surgeon: Alexis Frock, MD;  Location: [facility];  Service: Urology;  Laterality: Bilateral;  . PERCUTANEOUS NEPHROLITHOTRIPSY  2005  . ROBOT ASSISTED PYELOPLASTY N/A 12/09/2013   Procedure: ROBOTIC ASSISTED BILATERAL PYELOLITHOTOMY, RIGHT  PYELOPLASTY ;  Surgeon: Alexis Frock, MD;  Location: [facility];  Service: Urology;  Laterality: N/A;    History reviewed. No pertinent family history.  New complaints: None today  Social history: Lives by hisself  Controlled substance contract: n/a    Review of Systems  Constitutional: Negative for diaphoresis.  Eyes: Negative for pain.  Respiratory: Negative for shortness of breath.   Cardiovascular: Negative for chest pain, palpitations and leg swelling.  Gastrointestinal: Negative for abdominal pain.  Endocrine: Negative for polydipsia.  Skin: Negative for rash.  Neurological: Negative for dizziness, weakness and headaches.  Hematological: Does not bruise/bleed easily.  All other systems reviewed and are negative.      Objective:   Physical Exam Vitals and nursing note reviewed.  Constitutional:      Appearance: Normal appearance. He is well-developed.  HENT:     Head: Normocephalic.     Nose: Nose normal.  Eyes:     Pupils: Pupils are equal, round, and reactive to light.  Neck:     Thyroid: No thyroid mass or thyromegaly.     Vascular: No carotid bruit or JVD.     Trachea: Phonation normal.  Cardiovascular:  Rate and Rhythm: Normal rate and regular rhythm.  Pulmonary:     Effort: Pulmonary effort is normal. No respiratory distress.     Breath sounds: Normal breath sounds.  Abdominal:     General: Bowel sounds are normal.     Palpations: Abdomen is soft.     Tenderness: There is no abdominal tenderness.  Musculoskeletal:        General: Normal range of motion.     Cervical back: Normal range of motion  and neck supple.  Lymphadenopathy:     Cervical: No cervical adenopathy.  Skin:    General: Skin is warm and dry.  Neurological:     Mental Status: He is alert and oriented to person, place, and time.  Psychiatric:        Behavior: Behavior normal.        Thought Content: Thought content normal.        Judgment: Judgment normal.    BP 138/73   Pulse 80   Temp (!) 97.5 F (36.4 C) (Temporal)   Resp 20   Ht '6\' 2"'  (1.88 m)   Wt 193 lb (87.5 kg)   SpO2 100%   BMI 24.78 kg/m          Assessment & Plan:  Philip Richardson comes in today with chief complaint of Medical Management of Chronic Issues   Diagnosis and orders addressed:  1. Essential hypertension Low sodium diet - CMP14+EGFR - lisinopril-hydrochlorothiazide (ZESTORETIC) 20-12.5 MG tablet; Take 2 tablets by mouth daily.  Dispense: 180 tablet; Refill: 1 - amLODipine (NORVASC) 10 MG tablet; Take 1 tablet (10 mg total) by mouth daily.  Dispense: 90 tablet; Refill: 1  2. Hyperlipidemia with target LDL less than 100 Low fat diet - Lipid panel - atorvastatin (LIPITOR) 40 MG tablet; Take 1 tablet (40 mg total) by mouth daily.  Dispense: 90 tablet; Refill: 1  3. Type 2 diabetes mellitus without complication, without long-term current use of insulin (HCC) Strict carb counting Increased amaryl to 6m daily- hgba1c - Microalbumin / creatinine urine ratio - glimepiride (AMARYL) 4 MG tablet; Take 1 tablet (2 mg total) by mouth daily with breakfast.  Dispense: 90 tablet; Refill: 1 - metFORMIN (GLUCOPHAGE) 1000 MG tablet; Take 1 tablet (1,000 mg total) by mouth 2 (two) times daily with a meal.  Dispense: 180 tablet; Refill: 1   Labs pending Health Maintenance reviewed Diet and exercise encouraged  Follow up plan: 3 months   Mary-Margaret MHassell Done FNP

## 2019-06-04 LAB — CMP14+EGFR
ALT: 37 IU/L (ref 0–44)
AST: 23 IU/L (ref 0–40)
Albumin/Globulin Ratio: 1.6 (ref 1.2–2.2)
Albumin: 4.2 g/dL (ref 3.8–4.8)
Alkaline Phosphatase: 81 IU/L (ref 39–117)
BUN/Creatinine Ratio: 11 (ref 10–24)
BUN: 20 mg/dL (ref 8–27)
Bilirubin Total: 0.3 mg/dL (ref 0.0–1.2)
CO2: 24 mmol/L (ref 20–29)
Calcium: 10.1 mg/dL (ref 8.6–10.2)
Chloride: 93 mmol/L — ABNORMAL LOW (ref 96–106)
Creatinine, Ser: 1.84 mg/dL — ABNORMAL HIGH (ref 0.76–1.27)
GFR calc Af Amer: 44 mL/min/{1.73_m2} — ABNORMAL LOW (ref >59)
GFR calc non Af Amer: 38 mL/min/{1.73_m2} — ABNORMAL LOW (ref >59)
Globulin, Total: 2.6 g/dL (ref 1.5–4.5)
Glucose: 391 mg/dL — ABNORMAL HIGH (ref 65–99)
Potassium: 4.9 mmol/L (ref 3.5–5.2)
Sodium: 134 mmol/L (ref 134–144)
Total Protein: 6.8 g/dL (ref 6.0–8.5)

## 2019-06-04 LAB — LIPID PANEL
Chol/HDL Ratio: 4.6 ratio (ref 0.0–5.0)
Cholesterol, Total: 215 mg/dL — ABNORMAL HIGH (ref 100–199)
HDL: 47 mg/dL (ref >39)
LDL Chol Calc (NIH): 138 mg/dL — ABNORMAL HIGH (ref 0–99)
Triglycerides: 165 mg/dL — ABNORMAL HIGH (ref 0–149)
VLDL Cholesterol Cal: 30 mg/dL (ref 5–40)

## 2019-06-16 ENCOUNTER — Encounter: Payer: Self-pay | Admitting: Family Medicine

## 2019-06-26 ENCOUNTER — Ambulatory Visit: Payer: BC Managed Care – PPO | Admitting: Nurse Practitioner

## 2019-06-26 DIAGNOSIS — J40 Bronchitis, not specified as acute or chronic: Secondary | ICD-10-CM | POA: Diagnosis not present

## 2019-06-26 DIAGNOSIS — Z6823 Body mass index (BMI) 23.0-23.9, adult: Secondary | ICD-10-CM | POA: Diagnosis not present

## 2019-06-26 DIAGNOSIS — R05 Cough: Secondary | ICD-10-CM | POA: Diagnosis not present

## 2019-06-26 DIAGNOSIS — J0100 Acute maxillary sinusitis, unspecified: Secondary | ICD-10-CM | POA: Diagnosis not present

## 2019-07-03 DIAGNOSIS — K219 Gastro-esophageal reflux disease without esophagitis: Secondary | ICD-10-CM | POA: Diagnosis not present

## 2019-07-03 DIAGNOSIS — Z6824 Body mass index (BMI) 24.0-24.9, adult: Secondary | ICD-10-CM | POA: Diagnosis not present

## 2019-07-03 DIAGNOSIS — R0602 Shortness of breath: Secondary | ICD-10-CM | POA: Diagnosis not present

## 2019-07-03 DIAGNOSIS — R05 Cough: Secondary | ICD-10-CM | POA: Diagnosis not present

## 2019-07-07 ENCOUNTER — Telehealth: Payer: Self-pay | Admitting: Nurse Practitioner

## 2019-07-07 ENCOUNTER — Other Ambulatory Visit: Payer: Self-pay | Admitting: Nurse Practitioner

## 2019-07-07 MED ORDER — BENZONATATE 100 MG PO CAPS: 100.0000 mg | ORAL_CAPSULE | Freq: Three times a day (TID) | ORAL | 0 refills | Status: DC | PRN

## 2019-07-07 MED ORDER — AZITHROMYCIN 250 MG PO TABS: ORAL_TABLET | ORAL | 0 refills | Status: DC

## 2019-07-07 MED ORDER — PREDNISONE 10 MG (21) PO TBPK: ORAL_TABLET | ORAL | 0 refills | Status: DC

## 2019-07-07 NOTE — Telephone Encounter (Signed)
Pts girlfriend called stating that pt tested positive for COVID on 07/03/19 and is having a hard time breathing. Wants to know if MMM can prescribe him something or recommend something for him to take or do to help with his breathing. Can call girlfriend or patients home # [phone].

## 2019-07-07 NOTE — Telephone Encounter (Signed)
Lmtcb.

## 2019-07-07 NOTE — Progress Notes (Signed)
meds sent to [facility]- if no improvement to the ER

## 2019-07-07 NOTE — Telephone Encounter (Signed)
Patient is not wanting to do phone visit just wants prednison, zpack,inhaler, cough med with codeine , nasal spray sent to [facility]. Aware will not get cough med with out visit. Please advise.

## 2019-07-15 ENCOUNTER — Emergency Department (HOSPITAL_COMMUNITY): Payer: BC Managed Care – PPO

## 2019-07-15 ENCOUNTER — Encounter (HOSPITAL_COMMUNITY): Payer: Self-pay | Admitting: Emergency Medicine

## 2019-07-15 ENCOUNTER — Other Ambulatory Visit: Payer: Self-pay

## 2019-07-15 ENCOUNTER — Inpatient Hospital Stay (HOSPITAL_COMMUNITY)
Admission: EM | Admit: 2019-07-15 | Discharge: 2019-07-22 | DRG: 438 | Disposition: A | Discharge status: Home / Self Care | Payer: BC Managed Care – PPO | Attending: Internal Medicine | Admitting: Internal Medicine

## 2019-07-15 DIAGNOSIS — Q631 Lobulated, fused and horseshoe kidney: Secondary | ICD-10-CM

## 2019-07-15 DIAGNOSIS — R509 Fever, unspecified: Secondary | ICD-10-CM

## 2019-07-15 DIAGNOSIS — E1122 Type 2 diabetes mellitus with diabetic chronic kidney disease: Secondary | ICD-10-CM | POA: Diagnosis present

## 2019-07-15 DIAGNOSIS — K7689 Other specified diseases of liver: Secondary | ICD-10-CM | POA: Diagnosis not present

## 2019-07-15 DIAGNOSIS — R748 Abnormal levels of other serum enzymes: Secondary | ICD-10-CM

## 2019-07-15 DIAGNOSIS — E875 Hyperkalemia: Secondary | ICD-10-CM

## 2019-07-15 DIAGNOSIS — D72829 Elevated white blood cell count, unspecified: Secondary | ICD-10-CM | POA: Diagnosis not present

## 2019-07-15 DIAGNOSIS — E1165 Type 2 diabetes mellitus with hyperglycemia: Secondary | ICD-10-CM

## 2019-07-15 DIAGNOSIS — Z888 Allergy status to other drugs, medicaments and biological substances status: Secondary | ICD-10-CM

## 2019-07-15 DIAGNOSIS — N179 Acute kidney failure, unspecified: Secondary | ICD-10-CM | POA: Diagnosis not present

## 2019-07-15 DIAGNOSIS — E876 Hypokalemia: Secondary | ICD-10-CM | POA: Diagnosis not present

## 2019-07-15 DIAGNOSIS — K219 Gastro-esophageal reflux disease without esophagitis: Secondary | ICD-10-CM

## 2019-07-15 DIAGNOSIS — E785 Hyperlipidemia, unspecified: Secondary | ICD-10-CM | POA: Diagnosis present

## 2019-07-15 DIAGNOSIS — E1121 Type 2 diabetes mellitus with diabetic nephropathy: Secondary | ICD-10-CM | POA: Diagnosis not present

## 2019-07-15 DIAGNOSIS — Z8616 Personal history of COVID-19: Secondary | ICD-10-CM

## 2019-07-15 DIAGNOSIS — I10 Essential (primary) hypertension: Secondary | ICD-10-CM | POA: Diagnosis present

## 2019-07-15 DIAGNOSIS — E8809 Other disorders of plasma-protein metabolism, not elsewhere classified: Secondary | ICD-10-CM | POA: Diagnosis not present

## 2019-07-15 DIAGNOSIS — R1013 Epigastric pain: Secondary | ICD-10-CM | POA: Diagnosis not present

## 2019-07-15 DIAGNOSIS — K8500 Idiopathic acute pancreatitis without necrosis or infection: Principal | ICD-10-CM | POA: Diagnosis present

## 2019-07-15 DIAGNOSIS — K5900 Constipation, unspecified: Secondary | ICD-10-CM

## 2019-07-15 DIAGNOSIS — K859 Acute pancreatitis without necrosis or infection, unspecified: Secondary | ICD-10-CM | POA: Diagnosis present

## 2019-07-15 DIAGNOSIS — E441 Mild protein-calorie malnutrition: Secondary | ICD-10-CM | POA: Diagnosis present

## 2019-07-15 DIAGNOSIS — K5730 Diverticulosis of large intestine without perforation or abscess without bleeding: Secondary | ICD-10-CM | POA: Diagnosis not present

## 2019-07-15 DIAGNOSIS — R1310 Dysphagia, unspecified: Secondary | ICD-10-CM | POA: Diagnosis present

## 2019-07-15 DIAGNOSIS — R000 Tachycardia, unspecified: Secondary | ICD-10-CM | POA: Diagnosis not present

## 2019-07-15 DIAGNOSIS — Z6824 Body mass index (BMI) 24.0-24.9, adult: Secondary | ICD-10-CM | POA: Diagnosis not present

## 2019-07-15 DIAGNOSIS — Z7984 Long term (current) use of oral hypoglycemic drugs: Secondary | ICD-10-CM | POA: Diagnosis not present

## 2019-07-15 DIAGNOSIS — R109 Unspecified abdominal pain: Secondary | ICD-10-CM

## 2019-07-15 DIAGNOSIS — E1169 Type 2 diabetes mellitus with other specified complication: Secondary | ICD-10-CM

## 2019-07-15 DIAGNOSIS — E1100 Type 2 diabetes mellitus with hyperosmolarity without nonketotic hyperglycemic-hyperosmolar coma (NKHHC): Secondary | ICD-10-CM | POA: Diagnosis present

## 2019-07-15 DIAGNOSIS — Z79899 Other long term (current) drug therapy: Secondary | ICD-10-CM

## 2019-07-15 DIAGNOSIS — R079 Chest pain, unspecified: Secondary | ICD-10-CM | POA: Diagnosis not present

## 2019-07-15 DIAGNOSIS — I129 Hypertensive chronic kidney disease with stage 1 through stage 4 chronic kidney disease, or unspecified chronic kidney disease: Secondary | ICD-10-CM | POA: Diagnosis present

## 2019-07-15 DIAGNOSIS — R7989 Other specified abnormal findings of blood chemistry: Secondary | ICD-10-CM | POA: Diagnosis not present

## 2019-07-15 DIAGNOSIS — Z87442 Personal history of urinary calculi: Secondary | ICD-10-CM | POA: Diagnosis not present

## 2019-07-15 DIAGNOSIS — J690 Pneumonitis due to inhalation of food and vomit: Secondary | ICD-10-CM | POA: Diagnosis not present

## 2019-07-15 DIAGNOSIS — R739 Hyperglycemia, unspecified: Secondary | ICD-10-CM

## 2019-07-15 DIAGNOSIS — R1010 Upper abdominal pain, unspecified: Secondary | ICD-10-CM | POA: Diagnosis not present

## 2019-07-15 DIAGNOSIS — N200 Calculus of kidney: Secondary | ICD-10-CM | POA: Diagnosis not present

## 2019-07-15 DIAGNOSIS — E1101 Type 2 diabetes mellitus with hyperosmolarity with coma: Secondary | ICD-10-CM | POA: Diagnosis not present

## 2019-07-15 DIAGNOSIS — N1832 Chronic kidney disease, stage 3b: Secondary | ICD-10-CM | POA: Diagnosis not present

## 2019-07-15 DIAGNOSIS — K30 Functional dyspepsia: Secondary | ICD-10-CM | POA: Diagnosis not present

## 2019-07-15 DIAGNOSIS — E871 Hypo-osmolality and hyponatremia: Secondary | ICD-10-CM

## 2019-07-15 LAB — URINALYSIS, ROUTINE W REFLEX MICROSCOPIC
Bacteria, UA: NONE SEEN
Bilirubin Urine: NEGATIVE
Glucose, UA: >=500 mg/dL — AB
Ketones, ur: NEGATIVE mg/dL
Leukocytes,Ua: NEGATIVE
Nitrite: NEGATIVE
Protein, ur: NEGATIVE mg/dL
Specific Gravity, Urine: 1.02 (ref 1.005–1.030)
pH: 5 (ref 5.0–8.0)

## 2019-07-15 LAB — HEPATIC FUNCTION PANEL
ALT: 28 U/L (ref 0–44)
AST: 17 U/L (ref 15–41)
Albumin: 3.3 g/dL — ABNORMAL LOW (ref 3.5–5.0)
Alkaline Phosphatase: 95 U/L (ref 38–126)
Bilirubin, Direct: 0.2 mg/dL (ref 0.0–0.2)
Indirect Bilirubin: 0.7 mg/dL (ref 0.3–0.9)
Total Bilirubin: 0.9 mg/dL (ref 0.3–1.2)
Total Protein: 7.6 g/dL (ref 6.5–8.1)

## 2019-07-15 LAB — CBC
HCT: 37 % — ABNORMAL LOW (ref 39.0–52.0)
Hemoglobin: 12.4 g/dL — ABNORMAL LOW (ref 13.0–17.0)
MCH: 27.7 pg (ref 26.0–34.0)
MCHC: 33.5 g/dL (ref 30.0–36.0)
MCV: 82.8 fL (ref 80.0–100.0)
Platelets: 339 10*3/uL (ref 150–400)
RBC: 4.47 MIL/uL (ref 4.22–5.81)
RDW: 12.7 % (ref 11.5–15.5)
WBC: 12.9 10*3/uL — ABNORMAL HIGH (ref 4.0–10.5)
nRBC: 0 % (ref 0.0–0.2)

## 2019-07-15 LAB — BASIC METABOLIC PANEL
Anion gap: 18 — ABNORMAL HIGH (ref 5–15)
BUN: 44 mg/dL — ABNORMAL HIGH (ref 8–23)
CO2: 21 mmol/L — ABNORMAL LOW (ref 22–32)
Calcium: 9.9 mg/dL (ref 8.9–10.3)
Chloride: 82 mmol/L — ABNORMAL LOW (ref 98–111)
Creatinine, Ser: 2.86 mg/dL — ABNORMAL HIGH (ref 0.61–1.24)
GFR calc Af Amer: 26 mL/min — ABNORMAL LOW (ref >60)
GFR calc non Af Amer: 22 mL/min — ABNORMAL LOW (ref >60)
Glucose, Bld: 804 mg/dL (ref 70–99)
Potassium: 5.4 mmol/L — ABNORMAL HIGH (ref 3.5–5.1)
Sodium: 121 mmol/L — ABNORMAL LOW (ref 135–145)

## 2019-07-15 LAB — CBG MONITORING, ED
Glucose-Capillary: >600 mg/dL (ref 70–99)
Glucose-Capillary: 521 mg/dL (ref 70–99)
Glucose-Capillary: >600 mg/dL (ref 70–99)
Glucose-Capillary: 417 mg/dL — ABNORMAL HIGH (ref 70–99)
Glucose-Capillary: 225 mg/dL — ABNORMAL HIGH (ref 70–99)
Glucose-Capillary: 324 mg/dL — ABNORMAL HIGH (ref 70–99)

## 2019-07-15 LAB — BLOOD GAS, VENOUS
Acid-base deficit: 1.4 mmol/L (ref 0.0–2.0)
Bicarbonate: 21.4 mmol/L (ref 20.0–28.0)
FIO2: 21
O2 Saturation: 25.6 %
Patient temperature: 36.3
pCO2, Ven: 42 mmHg — ABNORMAL LOW (ref 44.0–60.0)
pH, Ven: 7.363 (ref 7.250–7.430)
pO2, Ven: <31 mmHg — CL (ref 32.0–45.0)

## 2019-07-15 LAB — TROPONIN I (HIGH SENSITIVITY)
Troponin I (High Sensitivity): 3 ng/L (ref <18)
Troponin I (High Sensitivity): 3 ng/L (ref <18)

## 2019-07-15 LAB — BETA-HYDROXYBUTYRIC ACID: Beta-Hydroxybutyric Acid: 1.96 mmol/L — ABNORMAL HIGH (ref 0.05–0.27)

## 2019-07-15 LAB — LIPASE, BLOOD: Lipase: 1014 U/L — ABNORMAL HIGH (ref 11–51)

## 2019-07-15 MED ORDER — INSULIN REGULAR(HUMAN) IN NACL 100-0.9 UT/100ML-% IV SOLN
INTRAVENOUS | Status: DC
  Administered 2019-07-15: 7.5 [IU]/h via INTRAVENOUS
  Administered 2019-07-16: 3.4 [IU]/h via INTRAVENOUS
  Filled 2019-07-15 (×2): qty 100

## 2019-07-15 MED ORDER — DEXTROSE 50 % IV SOLN: 0.0000 mL | INTRAVENOUS | Status: DC | PRN

## 2019-07-15 MED ORDER — SODIUM CHLORIDE 0.9% FLUSH
3.0000 mL | Freq: Once | INTRAVENOUS | Status: AC
  Administered 2019-07-15: 3 mL via INTRAVENOUS

## 2019-07-15 MED ORDER — DEXTROSE-NACL 5-0.45 % IV SOLN: INTRAVENOUS | Status: DC

## 2019-07-15 MED ORDER — SODIUM CHLORIDE 0.9 % IV BOLUS
1000.0000 mL | INTRAVENOUS | Status: AC
  Administered 2019-07-15 (×2): 1000 mL via INTRAVENOUS

## 2019-07-15 MED ORDER — INSULIN ASPART 100 UNIT/ML ~~LOC~~ SOLN
10.0000 [IU] | Freq: Once | SUBCUTANEOUS | Status: AC
  Administered 2019-07-15: 10 [IU] via SUBCUTANEOUS
  Filled 2019-07-15: qty 1

## 2019-07-15 MED ORDER — SODIUM CHLORIDE 0.9 % IV BOLUS
1000.0000 mL | Freq: Once | INTRAVENOUS | Status: AC
  Administered 2019-07-15: 1000 mL via INTRAVENOUS

## 2019-07-15 MED ORDER — PANTOPRAZOLE SODIUM 40 MG IV SOLR
40.0000 mg | Freq: Once | INTRAVENOUS | Status: AC
  Administered 2019-07-15: 40 mg via INTRAVENOUS
  Filled 2019-07-15: qty 40

## 2019-07-15 MED ORDER — SODIUM CHLORIDE 0.9 % IV SOLN: INTRAVENOUS | Status: DC

## 2019-07-15 NOTE — ED Triage Notes (Signed)
Patient c/o chest and abdominal pain that started yesterday. Reports no BM x 1 week.

## 2019-07-15 NOTE — ED Notes (Signed)
Date and time results received: 07/15/19 2117  Test: po2 Critical Value: <31.0  Name of Provider Notified: cook  Orders Received? Or Actions Taken?: na

## 2019-07-15 NOTE — ED Provider Notes (Addendum)
[facility] Provider Note   CSN: [id number] Arrival date & time: 07/15/19  1746     History Chief Complaint  Patient presents with  . Chest Pain  . Hyperglycemia    Philip Richardson is a 64 y.o. male.  Chief complaint lower chest pain and upper abdominal pain described as a "gas-like" sensation.  No frank substernal chest pain, dyspnea, diaphoresis.  Recent sinus infection which responded to antibiotics.  However his glucose has been running high lately.  Normal glucose for him is 140.  No bowel movement in 1 week.        Past Medical History:  Diagnosis Date  . Frequency of urination   . GERD (gastroesophageal reflux disease)   . Horseshoe kidney    BILATERAL  . Hypertension   . Renal calculus, bilateral   . Type 2 diabetes mellitus ([place])   . Urgency of urination   . Wears dentures     Patient Active Problem List   Diagnosis Date Noted  . Hyperlipidemia with target LDL less than 100 02/01/2015  . [place] kidney stones 12/09/2013  . Hypertension 10/13/2013  . Diabetes ([place]) 09/10/2013    Past Surgical History:  Procedure Laterality Date  . CYSTOSCOPY W/ URETERAL STENT PLACEMENT Bilateral 12/09/2013   Procedure: CYSTOSCOPY WITH RETROGRADE PYELOGRAM/URETERAL STENT PLACEMENT;  Surgeon: Alexis Frock, MD;  Location: [facility];  Service: Urology;  Laterality: Bilateral;  . CYSTOSCOPY WITH RETROGRADE PYELOGRAM, URETEROSCOPY AND STENT PLACEMENT Bilateral 09/30/2013   Procedure: CYSTOSCOPY WITH BILATERAL RETROGRADE PYELOGRAM, LEFT DIAGNOSTIC URETEROSCOPY AND Left ureteral stent;  Surgeon: Alexis Frock, MD;  Location: [facility];  Service: Urology;  Laterality: Bilateral;  . PERCUTANEOUS NEPHROLITHOTRIPSY  2005  . ROBOT ASSISTED PYELOPLASTY N/A 12/09/2013   Procedure: ROBOTIC ASSISTED BILATERAL PYELOLITHOTOMY, RIGHT  PYELOPLASTY ;  Surgeon: Alexis Frock, MD;  Location: [facility];  Service: Urology;  Laterality: N/A;       Family History    Problem Relation Age of Onset  . Cancer Mother     Social History   Tobacco Use  . Smoking status: Never Smoker  . Smokeless tobacco: Never Used  Substance Use Topics  . Alcohol use: No  . Drug use: No    Home Medications Prior to Admission medications   Medication Sig Start Date End Date Taking? Authorizing Provider  amLODipine (NORVASC) 10 MG tablet Take 1 tablet (10 mg total) by mouth daily. 06/03/19  Yes Martin, Mary-Margaret, FNP  benzonatate (TESSALON PERLES) 100 MG capsule Take 1 capsule (100 mg total) by mouth 3 (three) times daily as needed for cough. 07/07/19  Yes Hassell Done, Mary-Margaret, FNP  cimetidine (TAGAMET) 200 MG tablet Take 200 mg by mouth 3 (three) times daily. 07/03/19  Yes [provider]  glimepiride (AMARYL) 4 MG tablet Take 1 tablet (4 mg total) by mouth daily before breakfast. 06/03/19  Yes Hassell Done, Mary-Margaret, FNP  lisinopril-hydrochlorothiazide (ZESTORETIC) 20-12.5 MG tablet Take 2 tablets by mouth daily. Patient taking differently: Take 1 tablet by mouth 2 (two) times daily.  06/03/19  Yes Hassell Done, Mary-Margaret, FNP  metFORMIN (GLUCOPHAGE) 1000 MG tablet Take 1 tablet (1,000 mg total) by mouth 2 (two) times daily with a meal. 06/03/19  Yes Hassell Done, Mary-Margaret, FNP  Multiple Vitamin (MULTIVITAMIN WITH MINERALS) TABS tablet Take 1 tablet by mouth daily.   Yes [provider]  Plant Sterol Stanol-Pantethine (CHOLEST OFF COMPLETE PO) Take 1 tablet by mouth daily.   Yes [provider]  predniSONE (STERAPRED UNI-PAK 21 TAB) 10  MG (21) TBPK tablet As directed x 6 days Patient taking differently: Take 10-60 mg by mouth See admin instructions. 6 day course starting on 07/07/2019 (6,5,4,3,2,1) 07/07/19  Yes Hassell Done, Mary-Margaret, FNP  albuterol (VENTOLIN HFA) 108 (90 Base) MCG/ACT inhaler Inhale 2 puffs into the lungs every 6 (six) hours as needed for wheezing or shortness of breath.  06/26/19   [provider]  atorvastatin (LIPITOR)  40 MG tablet Take 1 tablet (40 mg total) by mouth daily. Patient not taking: Reported on 07/15/2019 06/03/19   Chevis Pretty, FNP  azithromycin (ZITHROMAX Z-PAK) 250 MG tablet As directed Patient not taking: Reported on 07/15/2019 07/07/19   Chevis Pretty, FNP  VIRTUSSIN A/C 100-10 MG/5ML syrup Take 5 mLs by mouth 3 (three) times daily as needed for cough.  06/26/19   [provider]    Allergies    Invokana [canagliflozin]  Review of Systems   Review of Systems  All other systems reviewed and are negative.   Physical Exam Updated Vital Signs BP 133/86   Pulse 94   Temp (!) 97.3 F (36.3 C) (Oral)   Resp 19   Ht 6\' 1"  (1.854 m)   Wt 84.4 kg   SpO2 94%   BMI 24.54 kg/m   Physical Exam Vitals and nursing note reviewed.  Constitutional:      Appearance: He is well-developed.     Comments: nad  HENT:     Head: Normocephalic and atraumatic.  Eyes:     Conjunctiva/sclera: Conjunctivae normal.  Cardiovascular:     Rate and Rhythm: Normal rate and regular rhythm.     Comments: Tender lower chest wall Pulmonary:     Effort: Pulmonary effort is normal.     Breath sounds: Normal breath sounds.  Abdominal:     General: Bowel sounds are normal.     Palpations: Abdomen is soft.     Comments: Tender epigastrium.  Musculoskeletal:        General: Normal range of motion.     Cervical back: Neck supple.  Skin:    General: Skin is warm and dry.  Neurological:     General: No focal deficit present.     Mental Status: He is alert and oriented to person, place, and time.  Psychiatric:        Behavior: Behavior normal.     ED Results / Procedures / Treatments   Labs (all labs ordered are listed, but only abnormal results are displayed) Labs Reviewed  BASIC METABOLIC PANEL - Abnormal; Notable for the following components:      Result Value   Sodium 121 (*)    Potassium 5.4 (*)    Chloride 82 (*)    CO2 21 (*)    Glucose, Bld 804 (*)    BUN 44 (*)     Creatinine, Ser 2.86 (*)    GFR calc non Af Amer 22 (*)    GFR calc Af Amer 26 (*)    Anion gap 18 (*)    All other components within normal limits  CBC - Abnormal; Notable for the following components:   WBC 12.9 (*)    Hemoglobin 12.4 (*)    HCT 37.0 (*)    All other components within normal limits  URINALYSIS, ROUTINE W REFLEX MICROSCOPIC - Abnormal; Notable for the following components:   Color, Urine STRAW (*)    Glucose, UA >=500 (*)    Hgb urine dipstick MODERATE (*)    All other components within normal  limits  HEPATIC FUNCTION PANEL - Abnormal; Notable for the following components:   Albumin 3.3 (*)    All other components within normal limits  LIPASE, BLOOD - Abnormal; Notable for the following components:   Lipase 1,014 (*)    All other components within normal limits  BETA-HYDROXYBUTYRIC ACID - Abnormal; Notable for the following components:   Beta-Hydroxybutyric Acid 1.96 (*)    All other components within normal limits  BLOOD GAS, VENOUS - Abnormal; Notable for the following components:   pCO2, Ven 42.0 (*)    pO2, Ven <31.0 (*)    All other components within normal limits  CBG MONITORING, ED - Abnormal; Notable for the following components:   Glucose-Capillary >600 (*)    All other components within normal limits  CBG MONITORING, ED - Abnormal; Notable for the following components:   Glucose-Capillary >600 (*)    All other components within normal limits  CBG MONITORING, ED - Abnormal; Notable for the following components:   Glucose-Capillary 521 (*)    All other components within normal limits  CBG MONITORING, ED - Abnormal; Notable for the following components:   Glucose-Capillary 417 (*)    All other components within normal limits  CBG MONITORING, ED - Abnormal; Notable for the following components:   Glucose-Capillary 324 (*)    All other components within normal limits  BETA-HYDROXYBUTYRIC ACID  TROPONIN I (HIGH SENSITIVITY)  TROPONIN I (HIGH  SENSITIVITY)    EKG EKG Interpretation  Date/Time:  Wednesday July 15 2019 17:53:49 EST Ventricular Rate:  116 PR Interval:  152 QRS Duration: 88 QT Interval:  310 QTC Calculation: 430 R Axis:   60 Text Interpretation: Sinus tachycardia Cannot rule out Anterior infarct , age undetermined Abnormal ECG Confirmed by Nat Christen [phone]) on 07/15/2019 6:27:27 PM   Radiology DG Chest 2 View  Result Date: 07/15/2019 CLINICAL DATA:  Chest pain. EXAM: CHEST - 2 VIEW COMPARISON:  12/01/2013 FINDINGS: The cardiomediastinal silhouette is within normal limits. There are new mild opacities in both lung bases. The upper lungs are clear. No pleural effusion or pneumothorax is identified. No acute osseous abnormality is seen. IMPRESSION: New mild bibasilar opacities which could reflect early infection or atelectasis. Electronically Signed   By: Logan Bores M.D.   On: 07/15/2019 18:58   DG Abdomen 1 View  Result Date: 07/15/2019 CLINICAL DATA:  Patient c/o chest and abdominal pain that started yesterday. Reports no BM x 1 week. EXAM: ABDOMEN - 1 VIEW COMPARISON:  Abdominal radiograph 09/21/2014, CT abdomen pelvis 09/09/2013 FINDINGS: There are no dilated loops of bowel to suggest obstruction. No supine evidence for free air. Moderate stool burden. Small radiopaque densities projecting over the bilateral kidneys mostly representing renal stones. Lung bases are excluded from field of view. No acute finding in the visualized skeleton. IMPRESSION: 1. Nonobstructive bowel gas pattern.  Moderate stool burden. 2. Bilateral renal calculi. Electronically Signed   By: Audie Pinto M.D.   On: 07/15/2019 19:20    Procedures Procedures (including critical care time)  Medications Ordered in ED Medications  insulin regular, human (MYXREDLIN) 100 units/ 100 mL infusion (3.6 Units/hr Intravenous Rate/Dose Change 07/15/19 2237)  0.9 %  sodium chloride infusion (has no administration in time range)  dextrose 5  %-0.45 % sodium chloride infusion (has no administration in time range)  dextrose 50 % solution 0-50 mL (has no administration in time range)  sodium chloride flush (NS) 0.9 % injection 3 mL (3 mLs Intravenous Given 07/15/19 1833)  sodium chloride 0.9 % bolus 1,000 mL (0 mLs Intravenous Stopped 07/15/19 2026)  insulin aspart (novoLOG) injection 10 Units (10 Units Subcutaneous Given 07/15/19 1907)  sodium chloride 0.9 % bolus 1,000 mL (1,000 mLs Intravenous New Bag/Given 07/15/19 2047)    ED Course  I have reviewed the triage vital signs and the nursing notes.  Pertinent labs & imaging results that were available during my care of the patient were reviewed by me and considered in my medical decision making (see chart for details).    MDM Rules/Calculators/A&P                      No acute abdomen.  Will work-up chest pain and epigastric pain.  Glucose elevated.  IV fluids and subcutaneous insulin administered.  2200: Multiple lab anomalies noted.  Glucose 804, potassium 5.4, sodium 121, creatinine 2.86, lipase 1014.  Endo tool initiated for hyperglycemia.  Will obtain CT scan of abdomen pelvis to evaluate elevated lipase.  Admit to general medicine.   CRITICAL CARE Performed by: Nat Christen Total critical care time: 45 minutes Critical care time was exclusive of separately billable procedures and treating other patients. Critical care was necessary to treat or prevent imminent or life-threatening deterioration. Critical care was time spent personally by me on the following activities: development of treatment plan with patient and/or surrogate as well as nursing, discussions with consultants, evaluation of patient's response to treatment, examination of patient, obtaining history from patient or surrogate, ordering and performing treatments and interventions, ordering and review of laboratory studies, ordering and review of radiographic studies, pulse oximetry and re-evaluation of patient's  condition. Final Clinical Impression(s) / ED Diagnoses Final diagnoses:  Chest pain, unspecified type  Epigastric pain  Hyperglycemia  Elevated lipase  AKI (acute kidney injury) [facility])    Rx / DC Orders ED Discharge Orders    None       Nat Christen, MD 07/15/19 Lanetta Inch    Nat Christen, MD 07/15/19 2259

## 2019-07-15 NOTE — ED Triage Notes (Signed)
Patient states he has not checked his blood sugar "in about 2 weeks." CBG completed in Triage with "high" reading.

## 2019-07-15 NOTE — ED Notes (Signed)
Date and time results received: 07/15/19 1938  Test: Glucose Critical Value: 804  Name of Provider Notified: Lacinda Axon  Orders Received? Or Actions Taken?: na

## 2019-07-16 ENCOUNTER — Inpatient Hospital Stay (HOSPITAL_COMMUNITY): Payer: BC Managed Care – PPO

## 2019-07-16 ENCOUNTER — Encounter (HOSPITAL_COMMUNITY): Payer: Self-pay | Admitting: Internal Medicine

## 2019-07-16 DIAGNOSIS — Q631 Lobulated, fused and horseshoe kidney: Secondary | ICD-10-CM | POA: Diagnosis not present

## 2019-07-16 DIAGNOSIS — Z7984 Long term (current) use of oral hypoglycemic drugs: Secondary | ICD-10-CM | POA: Diagnosis not present

## 2019-07-16 DIAGNOSIS — I10 Essential (primary) hypertension: Secondary | ICD-10-CM

## 2019-07-16 DIAGNOSIS — N179 Acute kidney failure, unspecified: Secondary | ICD-10-CM

## 2019-07-16 DIAGNOSIS — K859 Acute pancreatitis without necrosis or infection, unspecified: Secondary | ICD-10-CM | POA: Diagnosis present

## 2019-07-16 DIAGNOSIS — E441 Mild protein-calorie malnutrition: Secondary | ICD-10-CM | POA: Diagnosis present

## 2019-07-16 DIAGNOSIS — E785 Hyperlipidemia, unspecified: Secondary | ICD-10-CM

## 2019-07-16 DIAGNOSIS — E875 Hyperkalemia: Secondary | ICD-10-CM

## 2019-07-16 DIAGNOSIS — Z8616 Personal history of COVID-19: Secondary | ICD-10-CM | POA: Diagnosis not present

## 2019-07-16 DIAGNOSIS — K5900 Constipation, unspecified: Secondary | ICD-10-CM

## 2019-07-16 DIAGNOSIS — R1010 Upper abdominal pain, unspecified: Secondary | ICD-10-CM | POA: Diagnosis not present

## 2019-07-16 DIAGNOSIS — R109 Unspecified abdominal pain: Secondary | ICD-10-CM

## 2019-07-16 DIAGNOSIS — D72829 Elevated white blood cell count, unspecified: Secondary | ICD-10-CM

## 2019-07-16 DIAGNOSIS — R1310 Dysphagia, unspecified: Secondary | ICD-10-CM | POA: Diagnosis present

## 2019-07-16 DIAGNOSIS — K8500 Idiopathic acute pancreatitis without necrosis or infection: Secondary | ICD-10-CM | POA: Diagnosis not present

## 2019-07-16 DIAGNOSIS — E1165 Type 2 diabetes mellitus with hyperglycemia: Secondary | ICD-10-CM

## 2019-07-16 DIAGNOSIS — N1832 Chronic kidney disease, stage 3b: Secondary | ICD-10-CM | POA: Diagnosis present

## 2019-07-16 DIAGNOSIS — E1101 Type 2 diabetes mellitus with hyperosmolarity with coma: Secondary | ICD-10-CM | POA: Diagnosis present

## 2019-07-16 DIAGNOSIS — E1122 Type 2 diabetes mellitus with diabetic chronic kidney disease: Secondary | ICD-10-CM | POA: Diagnosis present

## 2019-07-16 DIAGNOSIS — R7989 Other specified abnormal findings of blood chemistry: Secondary | ICD-10-CM | POA: Diagnosis not present

## 2019-07-16 DIAGNOSIS — E871 Hypo-osmolality and hyponatremia: Secondary | ICD-10-CM | POA: Diagnosis present

## 2019-07-16 DIAGNOSIS — N200 Calculus of kidney: Secondary | ICD-10-CM | POA: Diagnosis not present

## 2019-07-16 DIAGNOSIS — K219 Gastro-esophageal reflux disease without esophagitis: Secondary | ICD-10-CM

## 2019-07-16 DIAGNOSIS — E1100 Type 2 diabetes mellitus with hyperosmolarity without nonketotic hyperglycemic-hyperosmolar coma (NKHHC): Secondary | ICD-10-CM | POA: Diagnosis not present

## 2019-07-16 DIAGNOSIS — K7689 Other specified diseases of liver: Secondary | ICD-10-CM | POA: Diagnosis not present

## 2019-07-16 DIAGNOSIS — I129 Hypertensive chronic kidney disease with stage 1 through stage 4 chronic kidney disease, or unspecified chronic kidney disease: Secondary | ICD-10-CM | POA: Diagnosis present

## 2019-07-16 DIAGNOSIS — J690 Pneumonitis due to inhalation of food and vomit: Secondary | ICD-10-CM | POA: Diagnosis not present

## 2019-07-16 DIAGNOSIS — E876 Hypokalemia: Secondary | ICD-10-CM | POA: Diagnosis not present

## 2019-07-16 DIAGNOSIS — R748 Abnormal levels of other serum enzymes: Secondary | ICD-10-CM | POA: Diagnosis not present

## 2019-07-16 DIAGNOSIS — Z6824 Body mass index (BMI) 24.0-24.9, adult: Secondary | ICD-10-CM | POA: Diagnosis not present

## 2019-07-16 DIAGNOSIS — Z79899 Other long term (current) drug therapy: Secondary | ICD-10-CM | POA: Diagnosis not present

## 2019-07-16 DIAGNOSIS — K5730 Diverticulosis of large intestine without perforation or abscess without bleeding: Secondary | ICD-10-CM | POA: Diagnosis not present

## 2019-07-16 DIAGNOSIS — Z87442 Personal history of urinary calculi: Secondary | ICD-10-CM | POA: Diagnosis not present

## 2019-07-16 DIAGNOSIS — K30 Functional dyspepsia: Secondary | ICD-10-CM | POA: Diagnosis not present

## 2019-07-16 DIAGNOSIS — Z888 Allergy status to other drugs, medicaments and biological substances status: Secondary | ICD-10-CM | POA: Diagnosis not present

## 2019-07-16 DIAGNOSIS — E8809 Other disorders of plasma-protein metabolism, not elsewhere classified: Secondary | ICD-10-CM

## 2019-07-16 LAB — CBC
HCT: 31.9 % — ABNORMAL LOW (ref 39.0–52.0)
Hemoglobin: 10.8 g/dL — ABNORMAL LOW (ref 13.0–17.0)
MCH: 28.2 pg (ref 26.0–34.0)
MCHC: 33.9 g/dL (ref 30.0–36.0)
MCV: 83.3 fL (ref 80.0–100.0)
Platelets: 226 10*3/uL (ref 150–400)
RBC: 3.83 MIL/uL — ABNORMAL LOW (ref 4.22–5.81)
RDW: 12.7 % (ref 11.5–15.5)
WBC: 14.5 10*3/uL — ABNORMAL HIGH (ref 4.0–10.5)
nRBC: 0 % (ref 0.0–0.2)

## 2019-07-16 LAB — GLUCOSE, CAPILLARY
Glucose-Capillary: 118 mg/dL — ABNORMAL HIGH (ref 70–99)
Glucose-Capillary: 135 mg/dL — ABNORMAL HIGH (ref 70–99)
Glucose-Capillary: 136 mg/dL — ABNORMAL HIGH (ref 70–99)
Glucose-Capillary: 141 mg/dL — ABNORMAL HIGH (ref 70–99)
Glucose-Capillary: 142 mg/dL — ABNORMAL HIGH (ref 70–99)
Glucose-Capillary: 157 mg/dL — ABNORMAL HIGH (ref 70–99)
Glucose-Capillary: 161 mg/dL — ABNORMAL HIGH (ref 70–99)
Glucose-Capillary: 170 mg/dL — ABNORMAL HIGH (ref 70–99)
Glucose-Capillary: 175 mg/dL — ABNORMAL HIGH (ref 70–99)
Glucose-Capillary: 182 mg/dL — ABNORMAL HIGH (ref 70–99)
Glucose-Capillary: 201 mg/dL — ABNORMAL HIGH (ref 70–99)
Glucose-Capillary: 224 mg/dL — ABNORMAL HIGH (ref 70–99)
Glucose-Capillary: 244 mg/dL — ABNORMAL HIGH (ref 70–99)
Glucose-Capillary: 247 mg/dL — ABNORMAL HIGH (ref 70–99)
Glucose-Capillary: 255 mg/dL — ABNORMAL HIGH (ref 70–99)

## 2019-07-16 LAB — BASIC METABOLIC PANEL
Anion gap: 6 (ref 5–15)
BUN: 38 mg/dL — ABNORMAL HIGH (ref 8–23)
CO2: 23 mmol/L (ref 22–32)
Calcium: 8.3 mg/dL — ABNORMAL LOW (ref 8.9–10.3)
Chloride: 99 mmol/L (ref 98–111)
Creatinine, Ser: 2.33 mg/dL — ABNORMAL HIGH (ref 0.61–1.24)
GFR calc Af Amer: 33 mL/min — ABNORMAL LOW (ref >60)
GFR calc non Af Amer: 29 mL/min — ABNORMAL LOW (ref >60)
Glucose, Bld: 254 mg/dL — ABNORMAL HIGH (ref 70–99)
Potassium: 4.7 mmol/L (ref 3.5–5.1)
Sodium: 128 mmol/L — ABNORMAL LOW (ref 135–145)

## 2019-07-16 LAB — COMPREHENSIVE METABOLIC PANEL
ALT: 21 U/L (ref 0–44)
ALT: 18 U/L (ref 0–44)
ALT: 20 U/L (ref 0–44)
ALT: 19 U/L (ref 0–44)
AST: 17 U/L (ref 15–41)
AST: 22 U/L (ref 15–41)
AST: 20 U/L (ref 15–41)
AST: 21 U/L (ref 15–41)
Albumin: 2.7 g/dL — ABNORMAL LOW (ref 3.5–5.0)
Albumin: 2.7 g/dL — ABNORMAL LOW (ref 3.5–5.0)
Albumin: 2.5 g/dL — ABNORMAL LOW (ref 3.5–5.0)
Albumin: 2.3 g/dL — ABNORMAL LOW (ref 3.5–5.0)
Alkaline Phosphatase: 68 U/L (ref 38–126)
Alkaline Phosphatase: 74 U/L (ref 38–126)
Alkaline Phosphatase: 71 U/L (ref 38–126)
Alkaline Phosphatase: 68 U/L (ref 38–126)
Anion gap: 9 (ref 5–15)
Anion gap: 6 (ref 5–15)
Anion gap: 7 (ref 5–15)
Anion gap: 6 (ref 5–15)
BUN: 39 mg/dL — ABNORMAL HIGH (ref 8–23)
BUN: 34 mg/dL — ABNORMAL HIGH (ref 8–23)
BUN: 31 mg/dL — ABNORMAL HIGH (ref 8–23)
BUN: 27 mg/dL — ABNORMAL HIGH (ref 8–23)
CO2: 22 mmol/L (ref 22–32)
CO2: 21 mmol/L — ABNORMAL LOW (ref 22–32)
CO2: 23 mmol/L (ref 22–32)
CO2: 21 mmol/L — ABNORMAL LOW (ref 22–32)
Calcium: 8.6 mg/dL — ABNORMAL LOW (ref 8.9–10.3)
Calcium: 8 mg/dL — ABNORMAL LOW (ref 8.9–10.3)
Calcium: 8 mg/dL — ABNORMAL LOW (ref 8.9–10.3)
Calcium: 7.6 mg/dL — ABNORMAL LOW (ref 8.9–10.3)
Chloride: 98 mmol/L (ref 98–111)
Chloride: 102 mmol/L (ref 98–111)
Chloride: 101 mmol/L (ref 98–111)
Chloride: 105 mmol/L (ref 98–111)
Creatinine, Ser: 2.38 mg/dL — ABNORMAL HIGH (ref 0.61–1.24)
Creatinine, Ser: 2.17 mg/dL — ABNORMAL HIGH (ref 0.61–1.24)
Creatinine, Ser: 1.97 mg/dL — ABNORMAL HIGH (ref 0.61–1.24)
Creatinine, Ser: 1.73 mg/dL — ABNORMAL HIGH (ref 0.61–1.24)
GFR calc Af Amer: 36 mL/min — ABNORMAL LOW (ref >60)
GFR calc Af Amer: 41 mL/min — ABNORMAL LOW (ref >60)
GFR calc Af Amer: 48 mL/min — ABNORMAL LOW (ref >60)
GFR calc non Af Amer: 31 mL/min — ABNORMAL LOW (ref >60)
GFR calc non Af Amer: 35 mL/min — ABNORMAL LOW (ref >60)
GFR calc non Af Amer: 41 mL/min — ABNORMAL LOW (ref >60)
Glucose, Bld: 229 mg/dL — ABNORMAL HIGH (ref 70–99)
Glucose, Bld: 153 mg/dL — ABNORMAL HIGH (ref 70–99)
Glucose, Bld: 162 mg/dL — ABNORMAL HIGH (ref 70–99)
Glucose, Bld: 182 mg/dL — ABNORMAL HIGH (ref 70–99)
Potassium: 5.2 mmol/L — ABNORMAL HIGH (ref 3.5–5.1)
Potassium: 4.6 mmol/L (ref 3.5–5.1)
Potassium: 4.6 mmol/L (ref 3.5–5.1)
Potassium: 4.8 mmol/L (ref 3.5–5.1)
Sodium: 129 mmol/L — ABNORMAL LOW (ref 135–145)
Sodium: 129 mmol/L — ABNORMAL LOW (ref 135–145)
Sodium: 131 mmol/L — ABNORMAL LOW (ref 135–145)
Sodium: 132 mmol/L — ABNORMAL LOW (ref 135–145)
Total Bilirubin: 0.8 mg/dL (ref 0.3–1.2)
Total Bilirubin: 0.7 mg/dL (ref 0.3–1.2)
Total Bilirubin: 0.8 mg/dL (ref 0.3–1.2)
Total Bilirubin: 0.7 mg/dL (ref 0.3–1.2)
Total Protein: 6.2 g/dL — ABNORMAL LOW (ref 6.5–8.1)
Total Protein: 6 g/dL — ABNORMAL LOW (ref 6.5–8.1)
Total Protein: 5.8 g/dL — ABNORMAL LOW (ref 6.5–8.1)
Total Protein: 5.6 g/dL — ABNORMAL LOW (ref 6.5–8.1)

## 2019-07-16 LAB — BETA-HYDROXYBUTYRIC ACID
Beta-Hydroxybutyric Acid: 1.6 mmol/L — ABNORMAL HIGH (ref 0.05–0.27)
Beta-Hydroxybutyric Acid: 0.17 mmol/L (ref 0.05–0.27)

## 2019-07-16 LAB — PHOSPHORUS: Phosphorus: 3.3 mg/dL (ref 2.5–4.6)

## 2019-07-16 LAB — HIV ANTIBODY (ROUTINE TESTING W REFLEX): HIV Screen 4th Generation wRfx: NONREACTIVE

## 2019-07-16 LAB — CBG MONITORING, ED
Glucose-Capillary: 141 mg/dL — ABNORMAL HIGH (ref 70–99)
Glucose-Capillary: 143 mg/dL — ABNORMAL HIGH (ref 70–99)
Glucose-Capillary: 169 mg/dL — ABNORMAL HIGH (ref 70–99)
Glucose-Capillary: 246 mg/dL — ABNORMAL HIGH (ref 70–99)

## 2019-07-16 LAB — MAGNESIUM: Magnesium: 1.6 mg/dL — ABNORMAL LOW (ref 1.7–2.4)

## 2019-07-16 LAB — PROCALCITONIN: Procalcitonin: 0.44 ng/mL

## 2019-07-16 LAB — MRSA PCR SCREENING: MRSA by PCR: NEGATIVE

## 2019-07-16 MED ORDER — BISACODYL 10 MG RE SUPP
10.0000 mg | Freq: Once | RECTAL | Status: AC
  Administered 2019-07-16: 10 mg via RECTAL
  Filled 2019-07-16: qty 1

## 2019-07-16 MED ORDER — ATORVASTATIN CALCIUM 40 MG PO TABS
40.0000 mg | ORAL_TABLET | Freq: Every day | ORAL | Status: DC
  Administered 2019-07-16: 40 mg via ORAL
  Filled 2019-07-16: qty 1

## 2019-07-16 MED ORDER — ONDANSETRON HCL 4 MG/2ML IJ SOLN: 4.0000 mg | Freq: Four times a day (QID) | INTRAMUSCULAR | Status: DC | PRN

## 2019-07-16 MED ORDER — ALBUTEROL SULFATE HFA 108 (90 BASE) MCG/ACT IN AERS: 2.0000 | INHALATION_SPRAY | Freq: Four times a day (QID) | RESPIRATORY_TRACT | Status: DC | PRN

## 2019-07-16 MED ORDER — GLUCERNA SHAKE PO LIQD
237.0000 mL | Freq: Three times a day (TID) | ORAL | Status: DC
  Administered 2019-07-17 – 2019-07-22 (×4): 237 mL via ORAL

## 2019-07-16 MED ORDER — DEXTROSE-NACL 5-0.9 % IV SOLN: INTRAVENOUS | Status: DC

## 2019-07-16 MED ORDER — MORPHINE SULFATE (PF) 2 MG/ML IV SOLN
2.0000 mg | INTRAVENOUS | Status: DC | PRN
  Administered 2019-07-16: 2 mg via INTRAVENOUS
  Filled 2019-07-16: qty 1

## 2019-07-16 MED ORDER — INSULIN DETEMIR 100 UNIT/ML ~~LOC~~ SOLN
10.0000 [IU] | Freq: Once | SUBCUTANEOUS | Status: AC
  Administered 2019-07-16: 10 [IU] via SUBCUTANEOUS
  Filled 2019-07-16: qty 0.1

## 2019-07-16 MED ORDER — INSULIN ASPART 100 UNIT/ML ~~LOC~~ SOLN
4.0000 [IU] | Freq: Three times a day (TID) | SUBCUTANEOUS | Status: DC
  Administered 2019-07-16: 4 [IU] via SUBCUTANEOUS

## 2019-07-16 MED ORDER — LIVING WELL WITH DIABETES BOOK: Freq: Once | Status: AC

## 2019-07-16 MED ORDER — MAGNESIUM SULFATE 50 % IJ SOLN: 1.0000 g | Freq: Once | INTRAMUSCULAR | Status: DC

## 2019-07-16 MED ORDER — INSULIN ASPART 100 UNIT/ML ~~LOC~~ SOLN: 0.0000 [IU] | Freq: Every day | SUBCUTANEOUS | Status: DC

## 2019-07-16 MED ORDER — SODIUM CHLORIDE 0.9 % IV SOLN: INTRAVENOUS | Status: AC

## 2019-07-16 MED ORDER — HEPARIN SODIUM (PORCINE) 5000 UNIT/ML IJ SOLN
5000.0000 [IU] | Freq: Three times a day (TID) | INTRAMUSCULAR | Status: DC
  Administered 2019-07-16 – 2019-07-22 (×18): 5000 [IU] via SUBCUTANEOUS
  Filled 2019-07-16 (×18): qty 1

## 2019-07-16 MED ORDER — CHLORHEXIDINE GLUCONATE CLOTH 2 % EX PADS
6.0000 | MEDICATED_PAD | Freq: Every day | CUTANEOUS | Status: DC
  Administered 2019-07-16 – 2019-07-17 (×2): 6 via TOPICAL

## 2019-07-16 MED ORDER — HYDROMORPHONE HCL 1 MG/ML IJ SOLN
0.5000 mg | INTRAMUSCULAR | Status: DC | PRN
  Administered 2019-07-16 – 2019-07-18 (×8): 0.5 mg via INTRAVENOUS
  Filled 2019-07-16 (×8): qty 0.5

## 2019-07-16 MED ORDER — INSULIN ASPART 100 UNIT/ML ~~LOC~~ SOLN
0.0000 [IU] | Freq: Three times a day (TID) | SUBCUTANEOUS | Status: DC
  Administered 2019-07-16: 5 [IU] via SUBCUTANEOUS

## 2019-07-16 MED ORDER — PANTOPRAZOLE SODIUM 40 MG PO TBEC
40.0000 mg | DELAYED_RELEASE_TABLET | Freq: Every day | ORAL | Status: DC
  Administered 2019-07-16 – 2019-07-17 (×2): 40 mg via ORAL
  Filled 2019-07-16 (×2): qty 1

## 2019-07-16 MED ORDER — AMLODIPINE BESYLATE 5 MG PO TABS
10.0000 mg | ORAL_TABLET | Freq: Every day | ORAL | Status: DC
  Administered 2019-07-16 – 2019-07-22 (×7): 10 mg via ORAL
  Filled 2019-07-16 (×7): qty 2

## 2019-07-16 MED ORDER — MAGNESIUM SULFATE IN D5W 1-5 GM/100ML-% IV SOLN
1.0000 g | Freq: Once | INTRAVENOUS | Status: AC
  Administered 2019-07-16: 1 g via INTRAVENOUS
  Filled 2019-07-16: qty 100

## 2019-07-16 NOTE — H&P (Addendum)
History and Physical  Philip Richardson HWE:[id number] DOB: 30-Nov-1955 DOA: 07/15/2019  Referring physician: Nat Christen PCP: Chevis Pretty, [place]  Patient coming from: Home   Chief Complaint:   HPI: Philip Richardson is a 64 y.o. male with medical history significant for hypertension, type 2 diabetes mellitus, hyperlipidemia, GERD and Horse shoe kidney who presents to the emergency department due to upper abdominal pain with radiation to the back and lower chest area causing lower chest pain which started on Tuesday (2/9), abdominal pain was described as  "gas-like "sensation and was rated as 8/10 on pain scale and was associated with decreased appetite.  Patient states that he has not had any bowel movement in 1 week. He is diabetic and he said that he has not checked his blood glucose level in 2 weeks.  He denies fever, chills, headache, diaphoresis.  Of note, patient states that he was recently treated for sinus infection which responds to antibiotics.  ED Course:  In the emergency department, temperature was 97.3 F, BP 104/66 with other vital signs being stable. He was noted to be hyperglycemic with blood glucose level at 804.  WBC 12.9, patient was also noted to be hyponatremic, hyperkalemic, hypoalbuminemic and elevated BUN/creatinine of 44/2.86 (baseline creatinine was 1.7-1.8).  Lipase 1014.  Urinalysis was positive for glucosuria.  CT abdomen and pelvis without contrast showed patchy/streaky airspace opacity at both lung bases which could be due to atelectasis or infectious etiology.  Findings consistent with diffuse extensive acute pancreatitis noted.  Normal x-ray showed nonobstructive bowel gas pattern with moderate stool burden and bilateral renal calculi.  Chest x-ray showed new mild bibasilar opacities which could reflect early infection or atelectasis.  10 units subcutaneous insulin followed by IV insulin drip was started.  IV Protonix and IV hydration provided.  Hospitalist was asked to  admit patient for further evaluation and management.  Review of Systems: Constitutional: Negative for chills and fever.  HENT: Negative for ear pain and sore throat.   Eyes: Negative for pain and visual disturbance.  Respiratory: Negative for cough, chest tightness and shortness of breath.   Cardiovascular: Positive for lower chest pain.  No diaphoresis or palpitations.  Gastrointestinal: Positive for abdominal pain.  No vomiting.  Endocrine: Negative for polyphagia and polyuria.  Genitourinary: Negative for decreased urine volume, dysuria Musculoskeletal: Negative for arthralgias and back pain.  Skin: Negative for color change and rash.  Allergic/Immunologic: Negative for immunocompromised state.  Neurological: Negative for tremors, syncope, speech difficulty, weakness, light-headedness and headaches.  Hematological: Does not bruise/bleed easily.  All other systems reviewed and are negative   Past Medical History:  Diagnosis Date  . Frequency of urination   . GERD (gastroesophageal reflux disease)   . Horseshoe kidney    BILATERAL  . Hypertension   . Renal calculus, bilateral   . Type 2 diabetes mellitus ([place])   . Urgency of urination   . Wears dentures    Past Surgical History:  Procedure Laterality Date  . CYSTOSCOPY W/ URETERAL STENT PLACEMENT Bilateral 12/09/2013   Procedure: CYSTOSCOPY WITH RETROGRADE PYELOGRAM/URETERAL STENT PLACEMENT;  Surgeon: Alexis Frock, MD;  Location: [facility];  Service: Urology;  Laterality: Bilateral;  . CYSTOSCOPY WITH RETROGRADE PYELOGRAM, URETEROSCOPY AND STENT PLACEMENT Bilateral 09/30/2013   Procedure: CYSTOSCOPY WITH BILATERAL RETROGRADE PYELOGRAM, LEFT DIAGNOSTIC URETEROSCOPY AND Left ureteral stent;  Surgeon: Alexis Frock, MD;  [facility];  Service: Urology;  Laterality: Bilateral;  . PERCUTANEOUS NEPHROLITHOTRIPSY  2005  . ROBOT ASSISTED PYELOPLASTY N/A 12/09/2013  Procedure: ROBOTIC ASSISTED BILATERAL  PYELOLITHOTOMY, RIGHT  PYELOPLASTY ;  Surgeon: Alexis Frock, MD;  Location: [facility];  Service: Urology;  Laterality: N/A;    Social History:  reports that he has never smoked. He has never used smokeless tobacco. He reports that he does not drink alcohol or use drugs.   Allergies  Allergen Reactions  . Invokana [Canagliflozin] Other (See Comments)    weakness    Family History  Problem Relation Age of Onset  . Cancer Mother      Prior to Admission medications   Medication Sig Start Date End Date Taking? Authorizing Provider  amLODipine (NORVASC) 10 MG tablet Take 1 tablet (10 mg total) by mouth daily. 06/03/19  Yes Martin, Mary-Margaret, FNP  benzonatate (TESSALON PERLES) 100 MG capsule Take 1 capsule (100 mg total) by mouth 3 (three) times daily as needed for cough. 07/07/19  Yes Hassell Done, Mary-Margaret, FNP  cimetidine (TAGAMET) 200 MG tablet Take 200 mg by mouth 3 (three) times daily. 07/03/19  Yes [provider]  glimepiride (AMARYL) 4 MG tablet Take 1 tablet (4 mg total) by mouth daily before breakfast. 06/03/19  Yes Hassell Done, Mary-Margaret, FNP  lisinopril-hydrochlorothiazide (ZESTORETIC) 20-12.5 MG tablet Take 2 tablets by mouth daily. Patient taking differently: Take 1 tablet by mouth 2 (two) times daily.  06/03/19  Yes Hassell Done, Mary-Margaret, FNP  metFORMIN (GLUCOPHAGE) 1000 MG tablet Take 1 tablet (1,000 mg total) by mouth 2 (two) times daily with a meal. 06/03/19  Yes Hassell Done, Mary-Margaret, FNP  Multiple Vitamin (MULTIVITAMIN WITH MINERALS) TABS tablet Take 1 tablet by mouth daily.   Yes [provider]  Plant Sterol Stanol-Pantethine (CHOLEST OFF COMPLETE PO) Take 1 tablet by mouth daily.   Yes [provider]  predniSONE (STERAPRED UNI-PAK 21 TAB) 10 MG (21) TBPK tablet As directed x 6 days Patient taking differently: Take 10-60 mg by mouth See admin instructions. 6 day course starting on 07/07/2019 (6,5,4,3,2,1) 07/07/19  Yes Hassell Done, Mary-Margaret, FNP    albuterol (VENTOLIN HFA) 108 (90 Base) MCG/ACT inhaler Inhale 2 puffs into the lungs every 6 (six) hours as needed for wheezing or shortness of breath.  06/26/19   [provider]  atorvastatin (LIPITOR) 40 MG tablet Take 1 tablet (40 mg total) by mouth daily. Patient not taking: Reported on 07/15/2019 06/03/19   Chevis Pretty, FNP  azithromycin (ZITHROMAX Z-PAK) 250 MG tablet As directed Patient not taking: Reported on 07/15/2019 07/07/19   Chevis Pretty, FNP  VIRTUSSIN A/C 100-10 MG/5ML syrup Take 5 mLs by mouth 3 (three) times daily as needed for cough.  06/26/19   [provider]    Physical Exam: BP (!) 147/87   Pulse 90   Temp 98 F (36.7 C) (Oral)   Resp (!) 21   Ht 6\' 1"  (1.854 m)   Wt 77.5 kg   SpO2 100%   BMI 22.54 kg/m   . General: 64 y.o. year-old male well developed well nourished in no acute distress.  Alert and oriented x3. Marland Kitchen HEENT: Normocephalic, atraumatic . Neck: Supple, trachea medial . Cardiovascular: Regular rate and rhythm with no rubs or gallops.  Mild tenderness to palpation of Xiphoid process. No thyromegaly or JVD noted.  No lower extremity edema. 2/4 pulses in all 4 extremities. Marland Kitchen Respiratory: Clear to auscultation with no wheezes or rales. Good inspiratory effort. . Abdomen: Soft, epigastric tenderness with normal bowel sounds x4 quadrants. . Muskuloskeletal: No cyanosis, clubbing or edema noted bilaterally . Neuro: CN II-XII intact, strength, sensation, reflexes .  Skin: No ulcerative lesions noted or rashes . Psychiatry: Judgement and insight appear normal. Mood is appropriate for condition and setting          Labs on Admission:  Basic Metabolic Panel: Recent Labs  Lab 07/15/19 1829 07/16/19 0342  NA 121* 129*  K 5.4* 5.2*  CL 82* 98  CO2 21* 22  GLUCOSE 804* 229*  BUN 44* 39*  CREATININE 2.86* 2.38*  CALCIUM 9.9 8.6*  MG  --  1.6*  PHOS  --  3.3   Liver Function Tests: Recent Labs  Lab 07/15/19 1829  07/16/19 0342  AST 17 17  ALT 28 21  ALKPHOS 95 68  BILITOT 0.9 0.8  PROT 7.6 6.2*  ALBUMIN 3.3* 2.7*   Recent Labs  Lab 07/15/19 1829  LIPASE 1,014*   No results for input(s): AMMONIA in the last 168 hours. CBC: Recent Labs  Lab 07/15/19 1829 07/16/19 0342  WBC 12.9* 14.5*  HGB 12.4* 10.8*  HCT 37.0* 31.9*  MCV 82.8 83.3  PLT 339 226   Cardiac Enzymes: No results for input(s): CKTOTAL, CKMB, CKMBINDEX, TROPONINI in the last 168 hours.  BNP (last 3 results) No results for input(s): BNP in the last 8760 hours.  ProBNP (last 3 results) No results for input(s): PROBNP in the last 8760 hours.  CBG: Recent Labs  Lab 07/16/19 0041 07/16/19 0144 07/16/19 0248 07/16/19 0536 07/16/19 0623  GLUCAP 141* 143* 169* 246* 255*    Radiological Exams on Admission: CT ABDOMEN PELVIS WO CONTRAST  Result Date: 07/16/2019 CLINICAL DATA:  Abdominal pain EXAM: CT ABDOMEN AND PELVIS without CONTRAST TECHNIQUE: Multidetector CT imaging of the abdomen and pelvis was performed following the standard protocol without IV contrast. COMPARISON:  None. FINDINGS: Lower chest: The visualized heart size within normal limits. No pericardial fluid/thickening. There is a small hiatal hernia present. Contrast is seen refluxing within the distal esophagus. Increased interstitial markings and patchy airspace opacity seen at both lung bases. Hepatobiliary: The liver is normal in density without focal abnormality.The main portal vein is patent. No evidence of calcified gallstones, gallbladder wall thickening or biliary dilatation. Pancreas: There is diffuse mesenteric fat stranding changes seen surrounding the entirety of the pancreas, predominantly around the pancreatic head and proximal body. No definite pancreatic ductal dilatation is seen. Common bile duct stones are seen. No loculated fluid collections is seen. Scattered small lymph nodes are noted. Parenchymal enhancement evaluation is limited due to  lack of intravenous contrast. Spleen: Normal in size without focal abnormality. Adrenals/Urinary Tract: Both adrenal glands appear normal. 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 Multiple bilateral renal calculi are noted. The largest measuring 1.2 cm in the upper pole the right kidney. There is mild bilateral renal atrophy. There is a 1 cm low-density lesion seen off the upper pole of the left kidney. No hydronephrosis. The bladder is unremarkable. Stomach/Bowel: There is a small hiatal hernia. There is diffuse fat stranding changes and wall thickening seen around the second and third portion of the duodenum. The remainder of the small bowel is unremarkable.Scattered colonic diverticula are noted without diverticulitis. The appendix is normal in appearance. Vascular/Lymphatic: There are no enlarged mesenteric, retroperitoneal, or pelvic lymph nodes. Scattered aortic atherosclerotic calcifications are seen without aneurysmal dilatation. Reproductive: The prostate is unremarkable. Other: No evidence of abdominal wall mass or hernia. Musculoskeletal: No acute or significant osseous findings. IMPRESSION: 1. Patchy/streaky airspace opacity at both lung bases which could be due to atelectasis or infectious etiology.  2. Findings consistent with diffuse extensive acute pancreatitis, most notable about the proximal body and pancreatic head. No definite loculated fluid collections or common bile duct stones. 3. Adjacent inflammatory changes involving the duodenum. 4. Multiple bilateral non-obstructing renal calculi. 5. Diverticulosis without diverticulitis. 6.  Aortic Atherosclerosis (ICD10-I70.0). Electronically Signed   By: Prudencio Pair M.D.   On: 07/16/2019 01:32   DG Chest 2 View  Result Date: 07/15/2019 CLINICAL DATA:  Chest pain. EXAM: CHEST - 2 VIEW COMPARISON:  12/01/2013 FINDINGS: The cardiomediastinal silhouette is within normal limits. There are new mild opacities in both lung bases. The  upper lungs are clear. No pleural effusion or pneumothorax is identified. No acute osseous abnormality is seen. IMPRESSION: New mild bibasilar opacities which could reflect early infection or atelectasis. Electronically Signed   By: Logan Bores M.D.   On: 07/15/2019 18:58   DG Abdomen 1 View  Result Date: 07/15/2019 CLINICAL DATA:  Patient c/o chest and abdominal pain that started yesterday. Reports no BM x 1 week. EXAM: ABDOMEN - 1 VIEW COMPARISON:  Abdominal radiograph 09/21/2014, CT abdomen pelvis 09/09/2013 FINDINGS: There are no dilated loops of bowel to suggest obstruction. No supine evidence for free air. Moderate stool burden. Small radiopaque densities projecting over the bilateral kidneys mostly representing renal stones. Lung bases are excluded from field of view. No acute finding in the visualized skeleton. IMPRESSION: 1. Nonobstructive bowel gas pattern.  Moderate stool burden. 2. Bilateral renal calculi. Electronically Signed   By: Audie Pinto M.D.   On: 07/15/2019 19:20    EKG: I independently viewed the EKG done and my findings are as followed: Sinus tachycardia at a rate of 116bpm Assessment/Plan Present on Admission: . Pancreatitis . Hyperosmolar hyperglycemic state (HHS) ([place]) . Hypertension  Principal Problem:   Hyperosmolar hyperglycemic state (HHS) ([place]) Active Problems:   Hypertension   Dyslipidemia   Pancreatitis   Abdominal pain   GERD (gastroesophageal reflux disease)   Hyponatremia   Hypomagnesemia   Hypoalbuminemia   Hyperkalemia   Leukocytosis   Hyperglycemia due to diabetes mellitus (HCC)   AKI (acute kidney injury) ([place])   Constipation   Hyperosmolar hyperglycemic state Hyperglycemia secondary to poorly controlled type 2 diabetes mellitus Patient presents with blood glucose level of 804 anion gap of 18 without metabolic acidosis Continue insulin drip, IV NS with IV potassium per DKA protocol Transition IV NS to D5 1/2NS when serum glucose <   250mg /dL Continue to monitor for anion gap closure and increased bicarb > 18 prior to transitioning patient to subcu insulin Continue NPO Glimepiride and Metformin will be held at this time  Abdominal pain possible secondary to acute pancreatitis Patient complains of abdominal pain with radiation to the back Lipase level was elevated at 1014 CT abdomen and pelvis suggestive of acute pancreatitis Continue IV Zofran p.r.n Continue IV morphine 2 mg every 4 hours p.r.n for pain Continue Protonix Start IV LR at  143ml/Hr when above IV fluid is discontinued Continue full liquid diet with plan to advance diet as tolerated RUQ U/S in the morning to investigate biliary etiology (gallstone and bile duct dilatation)  Essential hypertension (uncontrolled) Continue home amlodipine Hold lisinopril-HCTZ at this time due to hyperkalemia and acute kidney injury  Dyslipidemia Continue statin  GERD Continue Protonix  Pseudohyponatremia Na 121; corrected sodium level per hypoglycemia =132 Continue to monitor sodium level with morning labs  Hypomagnesemia Mg 1.6; this will be replenished  Hypoalbuminemia secondary to mild protein calorie malnutrition Albumin 3.3, protein supplement  be provided  Hyperkalemia K+ 5.4, this has improved to 5.2 Continue to monitor K level with morning labs  Questionable pulmonary infectious process  leukocytosis WBC is 12.9 CT abdomen and pelvis without contrast showed patchy/streaky airspace opacity at both lung bases which could be due to atelectasis or infectious etiology. Chest x-ray showed new mild bibasilar opacities which could reflect early infection or atelectasis. Patient was in no acute distress and denies shortness of breath.  Procalcitonin will be checked to rule out possible infectious process Continue incentive spirometry as needed  Acute kidney injury on ??? CKD  Creatinine on admission= Elevated BUN/creatinine of 44/2.86 (baseline creatinine  was 1.7-1.8).  Renally adjust medications, avoid nephrotoxic agents/dehydration/hypotension  Constipation Continue MiraLAX daily as needed  DVT prophylaxis: Subcu heparin  Code Status: Full code  Family Communication: None at bedside  Disposition Plan: Discharge home within the next 48 to 72 hours pending clinical improvement  Consults called: None  Admission status: Inpatient admission    Bernadette Hoit MD [facility]  If 7PM-7AM, please contact night-coverage [email or web address]  07/16/2019, 7:26 AM

## 2019-07-16 NOTE — Progress Notes (Signed)
Inpatient Diabetes Program Recommendations  AACE/ADA: New Consensus Statement on Inpatient Glycemic Control (2015)  Target Ranges:  Prepandial:   less than 140 mg/dL      Peak postprandial:   less than 180 mg/dL (1-2 hours)      Critically ill patients:  140 - 180 mg/dL   Lab Results  Component Value Date   GLUCAP 170 (H) 07/16/2019   HGBA1C 8.7 (H) 06/03/2019    Review of Glycemic Control  Diabetes history: DM2 Outpatient Diabetes medications: Amaryl 4 mg qd + Metformin 1 gm bid Current orders for Inpatient glycemic control: IV insulin drip  Inpatient Diabetes Program Recommendations:   Noted patient received Levemir 10 units @ 5852 and correction. Consider when transitioning off of IV insulin: -Increase Levemir to 12 units bid (0.3 units/kg x 77.5 kg)  Thank you, Bethena Roys E. Arlissa Monteverde, RN, MSN, CDE  Diabetes Coordinator Inpatient Glycemic Control Team Team Pager [phone] (8am-5pm) 07/16/2019 1:14 PM

## 2019-07-16 NOTE — Progress Notes (Signed)
Per HPI: Philip Richardson is a 64 y.o. male with medical history significant for hypertension, type 2 diabetes mellitus, hyperlipidemia, GERD and Horse shoe kidney who presents to the emergency department due to upper abdominal pain with radiation to the back and lower chest area causing lower chest pain which started on Tuesday (2/9), abdominal pain was described as  "gas-like "sensation and was rated as 8/10 on pain scale and was associated with decreased appetite.  Patient states that he has not had any bowel movement in 1 week. He is diabetic and he said that he has not checked his blood glucose level in 2 weeks.  He denies fever, chills, headache, diaphoresis.  Of note, patient states that he was recently treated for sinus infection which responds to antibiotics.  2/11: Patient was admitted with hyperosmolar hyperglycemic state in the setting of acute pancreatitis.  He also has recent history of COVID-19 diagnosis on 1/29 at an urgent care facility.  He is not hypoxemic, nor does he have any upper respiratory symptoms.  He has been started on IV insulin infusion as well as IV fluids and continues to have abdominal pain complaints.  GI consultation appreciated for the pancreatitis as there is questionable common bile duct stone present on imaging.  He denies alcohol use, but could have some of his symptoms related to Covid.  Continue n.p.o. status with close monitoring.  Total care time: 35 minutes.

## 2019-07-16 NOTE — Progress Notes (Signed)
Pt has not voided all shift, bladder scan performed around 1430, 200 mL found in the bladder. MD made aware, orders to recheck in approx. 2-3 hours and if >300 mL in and out cath the patient.  Pt urinated approx. 25 mL in urinal, bladder scanned patient again and 191 mL was found in the bladder. MD made aware, will continue to monitor.

## 2019-07-16 NOTE — Consult Note (Addendum)
Referring Provider: Bernadette Hoit, DO Primary Care Physician:  Chevis Pretty, FNP Primary Gastroenterologist:  Garfield Cornea, MD (previously unassigned  Reason for Consultation:  pancreatitis  HPI: Philip Richardson is a 64 y.o. male with past medical history for hypertension, type 2 diabetes mellitus, hyperlipidemia, GERD, horseshoe kidney presenting to the emergency department with severe epigastric pain radiating into the back for 24 hours duration. PATIENT WAS DIAGNOSED WITH COVID ON 07/03/19.  Patient states of the past 2 weeks he has not felt well.  He has had a hard time eating.  Discomfort in the upper abdomen which she felt was reflux related.  No bowel movement in 2 weeks.  Has been unable to take his medications.  Yesterday epigastric pain became so severe he had come to the emergency department.  He has had nausea but no vomiting.  Prior to 2 weeks ago he was having regular bowel movements.  No blood in the stool or melena.  Denies fever or chills.  No current shortness of breath. Initially seen at urgent care 06/26/19 for SOB, chest tightness, cough and wheezing. Patient was covid negative at that time. Retested 07/03/19 and was positive.  In the emergency department he was afebrile.  Vital signs stable.  Glucose of 804.  Noted to have significant electrolyte abnormalities and acute on chronic renal insufficiency.  His creatinine was 2.86 up from baseline of 1.8, BUN 44, sodium 121, potassium 5.4, chloride 82, white blood cell count 12,900, hemoglobin 12.4, hematocrit 37, troponin I was 3, lipase 1014, LFTs normal except albumin of 3.3.  ABGs with PO2 less than 31, PCO2 42.  CT abdomen pelvis without contrast yesterday showed small hiatal hernia, contrast refluxing into the distal esophagus, interstitial markings in patchy airspace opacity seen in both lung bases, diffuse mesenteric fat stranding surrounding the entirety of the pancreas, predominantly around the pancreatic head and  proximal body.  No definite pancreatic ductal dilatation. Multiple bilateral nonobstructing renal calculi.   "Common bile duct stones are seen" noted within the body of the report. In the impression of the study it says no common bile duct stones.   No prior episodes of pancreatitis. No history of ETOH use past or present. No FH of pancreatitis.   Prior to Admission medications   Medication Sig Start Date End Date Taking? Authorizing Provider  amLODipine (NORVASC) 10 MG tablet Take 1 tablet (10 mg total) by mouth daily. 06/03/19  Yes Martin, Mary-Margaret, FNP  benzonatate (TESSALON PERLES) 100 MG capsule Take 1 capsule (100 mg total) by mouth 3 (three) times daily as needed for cough. 07/07/19  Yes Hassell Done, Mary-Margaret, FNP  cimetidine (TAGAMET) 200 MG tablet Take 200 mg by mouth 3 (three) times daily. 07/03/19  Yes [provider]  glimepiride (AMARYL) 4 MG tablet Take 1 tablet (4 mg total) by mouth daily before breakfast. 06/03/19  Yes Hassell Done, Mary-Margaret, FNP  lisinopril-hydrochlorothiazide (ZESTORETIC) 20-12.5 MG tablet Take 2 tablets by mouth daily. Patient taking differently: Take 1 tablet by mouth 2 (two) times daily.  06/03/19  Yes Hassell Done, Mary-Margaret, FNP  metFORMIN (GLUCOPHAGE) 1000 MG tablet Take 1 tablet (1,000 mg total) by mouth 2 (two) times daily with a meal. 06/03/19  Yes Hassell Done, Mary-Margaret, FNP  Multiple Vitamin (MULTIVITAMIN WITH MINERALS) TABS tablet Take 1 tablet by mouth daily.   Yes [provider]  Plant Sterol Stanol-Pantethine (CHOLEST OFF COMPLETE PO) Take 1 tablet by mouth daily.   Yes [provider]  predniSONE (STERAPRED UNI-PAK 21 TAB) 10 MG (21) TBPK  tablet As directed x 6 days Patient taking differently: Take 10-60 mg by mouth See admin instructions. 6 day course starting on 07/07/2019 (6,5,4,3,2,1) 07/07/19  Yes Hassell Done, Mary-Margaret, FNP  albuterol (VENTOLIN HFA) 108 (90 Base) MCG/ACT inhaler Inhale 2 puffs into the lungs every 6 (six)  hours as needed for wheezing or shortness of breath.  06/26/19   [provider]  atorvastatin (LIPITOR) 40 MG tablet Take 1 tablet (40 mg total) by mouth daily. Patient not taking: Reported on 07/15/2019 06/03/19   Chevis Pretty, FNP  azithromycin (ZITHROMAX Z-PAK) 250 MG tablet As directed Patient not taking: Reported on 07/15/2019 07/07/19   Chevis Pretty, FNP  VIRTUSSIN A/C 100-10 MG/5ML syrup Take 5 mLs by mouth 3 (three) times daily as needed for cough.  06/26/19   [provider]    Current Facility-Administered Medications  Medication Dose Route Frequency Provider Last Rate Last Admin  . 0.9 %  sodium chloride infusion   Intravenous Continuous Nat Christen, MD      . albuterol (VENTOLIN HFA) 108 (90 Base) MCG/ACT inhaler 2 puff  2 puff Inhalation Q6H PRN Adefeso, Oladapo, DO      . amLODipine (NORVASC) tablet 10 mg  10 mg Oral Daily Adefeso, Oladapo, DO   10 mg at 07/16/19 0802  . atorvastatin (LIPITOR) tablet 40 mg  40 mg Oral Daily Adefeso, Oladapo, DO   40 mg at 07/16/19 0802  . Chlorhexidine Gluconate Cloth 2 % PADS 6 each  6 each Topical Daily Adefeso, Oladapo, DO   6 each at 07/16/19 0804  . dextrose 5 %-0.45 % sodium chloride infusion   Intravenous Continuous Nat Christen, MD 75 mL/hr at 07/16/19 0043 New Bag at 07/16/19 0043  . dextrose 50 % solution 0-50 mL  0-50 mL Intravenous PRN Nat Christen, MD      . feeding supplement (GLUCERNA SHAKE) (GLUCERNA SHAKE) liquid 237 mL  237 mL Oral TID BM Adefeso, Oladapo, DO      . heparin injection 5,000 Units  5,000 Units Subcutaneous Q8H Adefeso, Oladapo, DO      . insulin aspart (novoLOG) injection 0-15 Units  0-15 Units Subcutaneous TID WC Adefeso, Oladapo, DO   5 Units at 07/16/19 0802  . insulin aspart (novoLOG) injection 0-5 Units  0-5 Units Subcutaneous QHS Adefeso, Oladapo, DO      . insulin aspart (novoLOG) injection 4 Units  4 Units Subcutaneous TID WC Adefeso, Oladapo, DO   4 Units at 07/16/19 0803  .  insulin regular, human (MYXREDLIN) 100 units/ 100 mL infusion   Intravenous Continuous Nat Christen, MD 2.2 mL/hr at 07/16/19 0538 2.2 Units/hr at 07/16/19 0538  . magnesium sulfate IVPB 1 g 100 mL  1 g Intravenous Once Adefeso, Oladapo, DO 100 mL/hr at 07/16/19 0804 1 g at 07/16/19 0804  . morphine 2 MG/ML injection 2 mg  2 mg Intravenous Q4H PRN Adefeso, Oladapo, DO   2 mg at 07/16/19 0805  . ondansetron (ZOFRAN) injection 4 mg  4 mg Intravenous Q6H PRN Adefeso, Oladapo, DO      . pantoprazole (PROTONIX) EC tablet 40 mg  40 mg Oral Daily Adefeso, Oladapo, DO   40 mg at 07/16/19 0802    Allergies as of 07/15/2019 - Review Complete 07/15/2019  Allergen Reaction Noted  . Invokana [canagliflozin] Other (See Comments) 11/29/2015    Past Medical History:  Diagnosis Date  . Frequency of urination   . GERD (gastroesophageal reflux disease)   . Horseshoe kidney    BILATERAL  .  Hypertension   . Renal calculus, bilateral   . Type 2 diabetes mellitus ([place])   . Urgency of urination   . Wears dentures     Past Surgical History:  Procedure Laterality Date  . CYSTOSCOPY W/ URETERAL STENT PLACEMENT Bilateral 12/09/2013   Procedure: CYSTOSCOPY WITH RETROGRADE PYELOGRAM/URETERAL STENT PLACEMENT;  Surgeon: Alexis Frock, MD;  Location: [facility];  Service: Urology;  Laterality: Bilateral;  . CYSTOSCOPY WITH RETROGRADE PYELOGRAM, URETEROSCOPY AND STENT PLACEMENT Bilateral 09/30/2013   Procedure: CYSTOSCOPY WITH BILATERAL RETROGRADE PYELOGRAM, LEFT DIAGNOSTIC URETEROSCOPY AND Left ureteral stent;  Surgeon: Alexis Frock, MD;  Location: [facility];  Service: Urology;  Laterality: Bilateral;  . PERCUTANEOUS NEPHROLITHOTRIPSY  2005  . ROBOT ASSISTED PYELOPLASTY N/A 12/09/2013   Procedure: ROBOTIC ASSISTED BILATERAL PYELOLITHOTOMY, RIGHT  PYELOPLASTY ;  Surgeon: Alexis Frock, MD;  Location: [facility];  Service: Urology;  Laterality: N/A;    Family History  Problem Relation Age of Onset  .  Cancer Mother   . Colon cancer Neg Hx   . Pancreatitis Neg Hx     Social History   Socioeconomic History  . Marital status: Single    Spouse name: Not on file  . Number of children: Not on file  . Years of education: Not on file  . Highest education level: Not on file  Occupational History  . Not on file  Tobacco Use  . Smoking status: Never Smoker  . Smokeless tobacco: Never Used  Substance and Sexual Activity  . Alcohol use: No    Comment: no history of etoh use  . Drug use: No  . Sexual activity: Not on file  Other Topics Concern  . Not on file  Social History Narrative  . Not on file   Social Determinants of Health   Financial Resource Strain:   . Difficulty of Paying Living Expenses: Not on file  Food Insecurity:   . Worried About Charity fundraiser in the Last Year: Not on file  . Ran Out of Food in the Last Year: Not on file  Transportation Needs:   . Lack of Transportation (Medical): Not on file  . Lack of Transportation (Non-Medical): Not on file  Physical Activity:   . Days of Exercise per Week: Not on file  . Minutes of Exercise per Session: Not on file  Stress:   . Feeling of Stress : Not on file  Social Connections:   . Frequency of Communication with Friends and Family: Not on file  . Frequency of Social Gatherings with Friends and Family: Not on file  . Attends Religious Services: Not on file  . Active Member of Clubs or Organizations: Not on file  . Attends Archivist Meetings: Not on file  . Marital Status: Not on file  Intimate Partner Violence:   . Fear of Current or Ex-Partner: Not on file  . Emotionally Abused: Not on file  . Physically Abused: Not on file  . Sexually Abused: Not on file     ROS:  General: Negative for weight loss, fever, chills, +anorexia, +fatigue,+ weakness. Eyes: Negative for vision changes.  ENT: Negative for hoarseness, difficulty swallowing , nasal congestion. CV: Negative for chest pain, angina,  palpitations, dyspnea on exertion, peripheral edema.  Respiratory: Negative for dyspnea at rest, dyspnea on exertion, cough, sputum, wheezing. No current sob.  GI: See history of present illness. GU:  Negative for dysuria, hematuria, urinary incontinence, urinary frequency, nocturnal urination.  MS: Negative for joint pain, low  back pain.  Derm: Negative for rash or itching.  Neuro: Negative for weakness, abnormal sensation, seizure, frequent headaches, memory loss, confusion.  Psych: Negative for anxiety, depression, suicidal ideation, hallucinations.  Endo: Negative for unusual weight change.  Heme: Negative for bruising or bleeding. Allergy: Negative for rash or hives.       Physical Examination: Vital signs in last 24 hours: Temp:  [97.3 F (36.3 C)-98 F (36.7 C)] 98 F (36.7 C) (02/11 0804) Pulse Rate:  [77-94] 86 (02/11 0804) Resp:  [16-22] 16 (02/11 0804) BP: (104-158)/(66-105) 150/88 (02/11 0804) SpO2:  [94 %-100 %] 100 % (02/11 0804) Weight:  [77.5 kg-84.4 kg] 77.5 kg (02/11 0635) Last BM Date: 06/25/19(pt states its been 3 weeks)  General: Well-nourished, well-developed in no acute distress. Appears uncomfortable Head: Normocephalic, atraumatic.   Eyes: Conjunctiva pink, no icterus. Mouth: Oropharyngeal mucosa moist and pink , no lesions erythema or exudate. Neck: Supple without thyromegaly, masses, or lymphadenopathy.  Lungs: Clear to auscultation bilaterally.  Heart: Regular rate and rhythm, no murmurs rubs or gallops.  Abdomen: Bowel sounds are normal, nondistended, no hepatosplenomegaly or masses, no abdominal bruits or hernia , no rebound or guarding.  Moderate epigastric tenderness Rectal: not performed Extremities: No lower extremity edema, clubbing, deformity.  Neuro: Alert and oriented x 4 , grossly normal neurologically.  Skin: Warm and dry, no rash or jaundice.   Psych: Alert and cooperative, normal mood and affect.        Intake/Output from previous  day: No intake/output data recorded. Intake/Output this shift: No intake/output data recorded.  Lab Results: CBC Recent Labs    07/15/19 1829 07/16/19 0342  WBC 12.9* 14.5*  HGB 12.4* 10.8*  HCT 37.0* 31.9*  MCV 82.8 83.3  PLT 339 226   BMET Recent Labs    07/15/19 1829 07/16/19 0342  NA 121* 129*  K 5.4* 5.2*  CL 82* 98  CO2 21* 22  GLUCOSE 804* 229*  BUN 44* 39*  CREATININE 2.86* 2.38*  CALCIUM 9.9 8.6*   LFT Recent Labs    07/15/19 1829 07/16/19 0342  BILITOT 0.9 0.8  BILIDIR 0.2  --   IBILI 0.7  --   ALKPHOS 95 68  AST 17 17  ALT 28 21  PROT 7.6 6.2*  ALBUMIN 3.3* 2.7*    Lipase Recent Labs    07/15/19 1829  LIPASE 1,014*    PT/INR No results for input(s): LABPROT, INR in the last 72 hours.    Imaging Studies: CT ABDOMEN PELVIS WO CONTRAST  Result Date: 07/16/2019 CLINICAL DATA:  Abdominal pain EXAM: CT ABDOMEN AND PELVIS without CONTRAST TECHNIQUE: Multidetector CT imaging of the abdomen and pelvis was performed following the standard protocol without IV contrast. COMPARISON:  None. FINDINGS: Lower chest: The visualized heart size within normal limits. No pericardial fluid/thickening. There is a small hiatal hernia present. Contrast is seen refluxing within the distal esophagus. Increased interstitial markings and patchy airspace opacity seen at both lung bases. Hepatobiliary: The liver is normal in density without focal abnormality.The main portal vein is patent. No evidence of calcified gallstones, gallbladder wall thickening or biliary dilatation. Pancreas: There is diffuse mesenteric fat stranding changes seen surrounding the entirety of the pancreas, predominantly around the pancreatic head and proximal body. No definite pancreatic ductal dilatation is seen. Common bile duct stones are seen. No loculated fluid collections is seen. Scattered small lymph nodes are noted. Parenchymal enhancement evaluation is limited due to lack of intravenous  contrast. Spleen: Normal in size  without focal abnormality. Adrenals/Urinary Tract: Both adrenal glands appear normal. 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 Multiple bilateral renal calculi are noted. The largest measuring 1.2 cm in the upper pole the right kidney. There is mild bilateral renal atrophy. There is a 1 cm low-density lesion seen off the upper pole of the left kidney. No hydronephrosis. The bladder is unremarkable. Stomach/Bowel: There is a small hiatal hernia. There is diffuse fat stranding changes and wall thickening seen around the second and third portion of the duodenum. The remainder of the small bowel is unremarkable.Scattered colonic diverticula are noted without diverticulitis. The appendix is normal in appearance. Vascular/Lymphatic: There are no enlarged mesenteric, retroperitoneal, or pelvic lymph nodes. Scattered aortic atherosclerotic calcifications are seen without aneurysmal dilatation. Reproductive: The prostate is unremarkable. Other: No evidence of abdominal wall mass or hernia. Musculoskeletal: No acute or significant osseous findings. IMPRESSION: 1. Patchy/streaky airspace opacity at both lung bases which could be due to atelectasis or infectious etiology. 2. Findings consistent with diffuse extensive acute pancreatitis, most notable about the proximal body and pancreatic head. No definite loculated fluid collections or common bile duct stones. 3. Adjacent inflammatory changes involving the duodenum. 4. Multiple bilateral non-obstructing renal calculi. 5. Diverticulosis without diverticulitis. 6.  Aortic Atherosclerosis (ICD10-I70.0). Electronically Signed   By: Prudencio Pair M.D.   On: 07/16/2019 01:32   DG Chest 2 View  Result Date: 07/15/2019 CLINICAL DATA:  Chest pain. EXAM: CHEST - 2 VIEW COMPARISON:  12/01/2013 FINDINGS: The cardiomediastinal silhouette is within normal limits. There are new mild opacities in both lung bases. The upper lungs are clear.  No pleural effusion or pneumothorax is identified. No acute osseous abnormality is seen. IMPRESSION: New mild bibasilar opacities which could reflect early infection or atelectasis. Electronically Signed   By: Logan Bores M.D.   On: 07/15/2019 18:58   DG Abdomen 1 View  Result Date: 07/15/2019 CLINICAL DATA:  Patient c/o chest and abdominal pain that started yesterday. Reports no BM x 1 week. EXAM: ABDOMEN - 1 VIEW COMPARISON:  Abdominal radiograph 09/21/2014, CT abdomen pelvis 09/09/2013 FINDINGS: There are no dilated loops of bowel to suggest obstruction. No supine evidence for free air. Moderate stool burden. Small radiopaque densities projecting over the bilateral kidneys mostly representing renal stones. Lung bases are excluded from field of view. No acute finding in the visualized skeleton. IMPRESSION: 1. Nonobstructive bowel gas pattern.  Moderate stool burden. 2. Bilateral renal calculi. Electronically Signed   By: Audie Pinto M.D.   On: 07/15/2019 19:20  [4 week]   Impression: Pleasant 64 year old with history of diabetes, hypertension, GERD presenting for further evaluation of progressive epigastric pain associated with nausea, overall feeling poor for couple of weeks.  Patient initially presented to urgent care on January 22 with complaints of upper respiratory symptoms, tested negative for influenza and Covid at the time.  He eventually was retested on January 29 was Covid positive.  Over the past 2 days he has had progressive epigastric pain radiating into his back requiring ED evaluation.  Noted to have acute pancreatitis.  Unclear etiology at this point.  Recent triglycerides and calcium unremarkable.  No prior alcohol use. I have personally reviewed CT with Dr. Thornton Papas and patient does not have CBD stones. CBD diameter 0.2cm on u/s and LFTs are normal.   Constipation likely multifactorial in setting of Covid, pancreatitis. Will provide dulcolax suppository today, transition to miralax  once he begins oral intake.    Plan: 1.  Add NS 200cc/hr for next 8 hours in addition to his dextrose/NS infusion.  2. NPO.  3. Supportive measures with adequate pain management.  4. Follow labs.  5. We will follow with you.   We would like to thank you for the opportunity to participate in the care of Philip Richardson.  Laureen Ochs. Bernarda Caffey [facility] [phone] 2/11/202112:31 PM     LOS: 0 days

## 2019-07-17 DIAGNOSIS — R1010 Upper abdominal pain, unspecified: Secondary | ICD-10-CM

## 2019-07-17 DIAGNOSIS — K219 Gastro-esophageal reflux disease without esophagitis: Secondary | ICD-10-CM

## 2019-07-17 DIAGNOSIS — E1165 Type 2 diabetes mellitus with hyperglycemia: Secondary | ICD-10-CM

## 2019-07-17 DIAGNOSIS — K8500 Idiopathic acute pancreatitis without necrosis or infection: Principal | ICD-10-CM

## 2019-07-17 DIAGNOSIS — N179 Acute kidney failure, unspecified: Secondary | ICD-10-CM

## 2019-07-17 DIAGNOSIS — R1013 Epigastric pain: Secondary | ICD-10-CM

## 2019-07-17 DIAGNOSIS — K5900 Constipation, unspecified: Secondary | ICD-10-CM

## 2019-07-17 DIAGNOSIS — E1100 Type 2 diabetes mellitus with hyperosmolarity without nonketotic hyperglycemic-hyperosmolar coma (NKHHC): Secondary | ICD-10-CM

## 2019-07-17 DIAGNOSIS — R748 Abnormal levels of other serum enzymes: Secondary | ICD-10-CM

## 2019-07-17 LAB — COMPREHENSIVE METABOLIC PANEL
ALT: 20 U/L (ref 0–44)
ALT: 19 U/L (ref 0–44)
AST: 20 U/L (ref 15–41)
AST: 22 U/L (ref 15–41)
Albumin: 2.4 g/dL — ABNORMAL LOW (ref 3.5–5.0)
Albumin: 2.4 g/dL — ABNORMAL LOW (ref 3.5–5.0)
Alkaline Phosphatase: 70 U/L (ref 38–126)
Alkaline Phosphatase: 64 U/L (ref 38–126)
Anion gap: 7 (ref 5–15)
Anion gap: 8 (ref 5–15)
BUN: 27 mg/dL — ABNORMAL HIGH (ref 8–23)
BUN: 24 mg/dL — ABNORMAL HIGH (ref 8–23)
CO2: 20 mmol/L — ABNORMAL LOW (ref 22–32)
CO2: 21 mmol/L — ABNORMAL LOW (ref 22–32)
Calcium: 7.7 mg/dL — ABNORMAL LOW (ref 8.9–10.3)
Calcium: 7.9 mg/dL — ABNORMAL LOW (ref 8.9–10.3)
Chloride: 104 mmol/L (ref 98–111)
Chloride: 104 mmol/L (ref 98–111)
Creatinine, Ser: 1.76 mg/dL — ABNORMAL HIGH (ref 0.61–1.24)
Creatinine, Ser: 1.72 mg/dL — ABNORMAL HIGH (ref 0.61–1.24)
GFR calc Af Amer: 47 mL/min — ABNORMAL LOW (ref >60)
GFR calc Af Amer: 48 mL/min — ABNORMAL LOW (ref >60)
GFR calc non Af Amer: 40 mL/min — ABNORMAL LOW (ref >60)
GFR calc non Af Amer: 41 mL/min — ABNORMAL LOW (ref >60)
Glucose, Bld: 167 mg/dL — ABNORMAL HIGH (ref 70–99)
Glucose, Bld: 151 mg/dL — ABNORMAL HIGH (ref 70–99)
Potassium: 4.9 mmol/L (ref 3.5–5.1)
Potassium: 4.9 mmol/L (ref 3.5–5.1)
Sodium: 131 mmol/L — ABNORMAL LOW (ref 135–145)
Sodium: 133 mmol/L — ABNORMAL LOW (ref 135–145)
Total Bilirubin: 0.8 mg/dL (ref 0.3–1.2)
Total Bilirubin: 0.4 mg/dL (ref 0.3–1.2)
Total Protein: 5.7 g/dL — ABNORMAL LOW (ref 6.5–8.1)
Total Protein: 5.9 g/dL — ABNORMAL LOW (ref 6.5–8.1)

## 2019-07-17 LAB — GLUCOSE, CAPILLARY
Glucose-Capillary: 126 mg/dL — ABNORMAL HIGH (ref 70–99)
Glucose-Capillary: 132 mg/dL — ABNORMAL HIGH (ref 70–99)
Glucose-Capillary: 132 mg/dL — ABNORMAL HIGH (ref 70–99)
Glucose-Capillary: 137 mg/dL — ABNORMAL HIGH (ref 70–99)
Glucose-Capillary: 144 mg/dL — ABNORMAL HIGH (ref 70–99)
Glucose-Capillary: 146 mg/dL — ABNORMAL HIGH (ref 70–99)
Glucose-Capillary: 146 mg/dL — ABNORMAL HIGH (ref 70–99)
Glucose-Capillary: 148 mg/dL — ABNORMAL HIGH (ref 70–99)
Glucose-Capillary: 148 mg/dL — ABNORMAL HIGH (ref 70–99)
Glucose-Capillary: 150 mg/dL — ABNORMAL HIGH (ref 70–99)
Glucose-Capillary: 150 mg/dL — ABNORMAL HIGH (ref 70–99)
Glucose-Capillary: 156 mg/dL — ABNORMAL HIGH (ref 70–99)
Glucose-Capillary: 159 mg/dL — ABNORMAL HIGH (ref 70–99)
Glucose-Capillary: 169 mg/dL — ABNORMAL HIGH (ref 70–99)
Glucose-Capillary: 246 mg/dL — ABNORMAL HIGH (ref 70–99)

## 2019-07-17 LAB — HEMOGLOBIN A1C
Hgb A1c MFr Bld: 12.6 % — ABNORMAL HIGH (ref 4.8–5.6)
Mean Plasma Glucose: 314.92 mg/dL

## 2019-07-17 LAB — LIPASE, BLOOD: Lipase: 310 U/L — ABNORMAL HIGH (ref 11–51)

## 2019-07-17 MED ORDER — INSULIN ASPART 100 UNIT/ML ~~LOC~~ SOLN: 0.0000 [IU] | Freq: Every day | SUBCUTANEOUS | Status: DC

## 2019-07-17 MED ORDER — INSULIN DETEMIR 100 UNIT/ML ~~LOC~~ SOLN
12.0000 [IU] | Freq: Two times a day (BID) | SUBCUTANEOUS | Status: DC
  Administered 2019-07-17 – 2019-07-20 (×7): 12 [IU] via SUBCUTANEOUS
  Filled 2019-07-17 (×12): qty 0.12

## 2019-07-17 MED ORDER — INSULIN ASPART 100 UNIT/ML ~~LOC~~ SOLN
0.0000 [IU] | Freq: Three times a day (TID) | SUBCUTANEOUS | Status: DC
  Administered 2019-07-17: 3 [IU] via SUBCUTANEOUS

## 2019-07-17 MED ORDER — PANTOPRAZOLE SODIUM 40 MG PO TBEC
40.0000 mg | DELAYED_RELEASE_TABLET | Freq: Every day | ORAL | Status: DC
  Administered 2019-07-18: 40 mg via ORAL
  Filled 2019-07-17: qty 1

## 2019-07-17 NOTE — Progress Notes (Signed)
PROGRESS NOTE    Philip Richardson  RFF:[id number] DOB: 08-31-1955 DOA: 07/15/2019 PCP: Chevis Pretty, FNP   Brief Narrative:  Per HPI: Philip Romingeris a 64 y.o.malewith medical history significant forhypertension, type 2 diabetes mellitus, hyperlipidemia, GERD andHorse shoe kidneywho presents to the emergency department due to upper abdominal pain with radiation to the back and lower chest area causing lower chest pain which started on Tuesday (2/9), abdominal pain was described as "gas-like"sensation and was rated as 8/10 on pain scale and was associated with decreased appetite. Patient states that he has not had any bowel movement in 1 week. Heis diabetic and he said that he has not checked his blood glucose level in 2 weeks. He denies fever, chills, headache, diaphoresis. Of note, patient states that he was recently treated for sinus infection which responds to antibiotics.  2/11: Patient was admitted with hyperosmolar hyperglycemic state in the setting of acute pancreatitis.  He also has recent history of COVID-19 diagnosis on 1/29 at an urgent care facility.  He is not hypoxemic, nor does he have any upper respiratory symptoms.  He has been started on IV insulin infusion as well as IV fluids and continues to have abdominal pain complaints.  GI consultation appreciated for the pancreatitis as there is questionable common bile duct stone present on imaging.  He denies alcohol use, but could have some of his symptoms related to Covid.  Continue n.p.o. status with close monitoring.  2/12: Patient has improved blood glucose readings and less abdominal pain noted this morning.  Awaiting GI recommendations for diet advancement and then will plan to stop insulin drip and convert to sliding scale.  Assessment & Plan:   Principal Problem:   Hyperosmolar hyperglycemic state (HHS) ([place]) Active Problems:   Hypertension   Dyslipidemia   Pancreatitis   Abdominal pain   GERD  (gastroesophageal reflux disease)   Hyponatremia   Hypomagnesemia   Hypoalbuminemia   Hyperkalemia   Leukocytosis   Hyperglycemia due to diabetes mellitus (HCC)   AKI (acute kidney injury) ([place])   Constipation   Acute pancreatitis-idiopathic -Appears to be improving with downtrending lipase -Appreciate GI recommendations for dietary advancement -Maintain on IV fluid and insulin drip for now -Patient will likely require outpatient imaging studies as recommended in the near future  Hyperosmolar hyperglycemic state secondary to above -We will plan to transition to sliding scale insulin once patient is able to tolerate diet  Recent COVID-19 diagnosis -Patient appears asymptomatic from this currently does not require isolation -Diagnosed in urgent care facility 1/29  Essential hypertension-controlled -Continue home amlodipine -Hold lisinopril/HCTZ  Dyslipidemia -Holding statin for now  GERD -Continue PPI  Mild hyponatremia -Likely secondary to HCTZ use at home, recommend holding for now -Continue on D5 normal saline while on insulin infusion at this time  AKI on CKD stage IIIb -Appears to be resolved and he is back to his baseline creatinine near 1.8 -Continue to monitor and remain off lisinopril/HCTZ for now  Constipation -Appreciate GI recommendations and will keep on MiraLAX as needed   DVT prophylaxis: Heparin Code Status: Full Family Communication: None at bedside Disposition Plan: Continue to treat for pancreatitis and convert IV insulin to sliding scale once tolerating diet.   Consultants:   GI  Procedures:   See studies below  Antimicrobials:  Anti-infectives (From admission, onward)   None       Subjective: Patient seen and evaluated today with no new acute complaints or concerns. No acute concerns or events noted overnight.  He  denies any further abdominal pain, nausea, or vomiting.  Objective: Vitals:   07/17/19 0819 07/17/19 0900 07/17/19  1000 07/17/19 1100  BP:  (!) 144/83 (!) 153/76 (!) 152/76  Pulse: (!) 111 (!) 113 (!) 108 (!) 112  Resp: 18 19 18 20   Temp:      TempSrc:      SpO2: 100% 99% 99% 100%  Weight:      Height:        Intake/Output Summary (Last 24 hours) at 07/17/2019 1119 Last data filed at 07/17/2019 1045 Gross per 24 hour  Intake 2050.69 ml  Output 1275 ml  Net 775.69 ml   Filed Weights   07/15/19 1801 07/16/19 0635 07/17/19 0500  Weight: 84.4 kg 77.5 kg 79.6 kg    Examination:  General exam: Appears calm and comfortable  Respiratory system: Clear to auscultation. Respiratory effort normal. Cardiovascular system: S1 & S2 heard, RRR. No JVD, murmurs, rubs, gallops or clicks. No pedal edema. Gastrointestinal system: Abdomen is minimally distended, soft and nontender. No organomegaly or masses felt. Normal bowel sounds heard. Central nervous system: Alert and oriented. No focal neurological deficits. Extremities: Symmetric 5 x 5 power. Skin: No rashes, lesions or ulcers Psychiatry: Judgement and insight appear normal. Mood & affect appropriate.     Data Reviewed: I have personally reviewed following labs and imaging studies  CBC: Recent Labs  Lab 07/15/19 1829 07/16/19 0342  WBC 12.9* 14.5*  HGB 12.4* 10.8*  HCT 37.0* 31.9*  MCV 82.8 83.3  PLT 339 638   Basic Metabolic Panel: Recent Labs  Lab 07/16/19 0342 07/16/19 0845 07/16/19 1215 07/16/19 1642 07/16/19 2108 07/17/19 0038 07/17/19 0341  NA 129*   < > 129* 131* 132* 131* 133*  K 5.2*   < > 4.6 4.6 4.8 4.9 4.9  CL 98   < > 102 101 105 104 104  CO2 22   < > 21* 23 21* 20* 21*  GLUCOSE 229*   < > 153* 162* 182* 167* 151*  BUN 39*   < > 34* 31* 27* 27* 24*  CREATININE 2.38*   < > 2.17* 1.97* 1.73* 1.76* 1.72*  CALCIUM 8.6*   < > 8.0* 8.0* 7.6* 7.7* 7.9*  MG 1.6*  --   --   --   --   --   --   PHOS 3.3  --   --   --   --   --   --    < > = values in this interval not displayed.   GFR: Estimated Creatinine Clearance: 49.5  mL/min (A) (by C-G formula based on SCr of 1.72 mg/dL (H)). Liver Function Tests: Recent Labs  Lab 07/16/19 1215 07/16/19 1642 07/16/19 2108 07/17/19 0038 07/17/19 0341  AST 22 20 21 20 22   ALT 18 20 19 20 19   ALKPHOS 74 71 68 70 64  BILITOT 0.7 0.8 0.7 0.8 0.4  PROT 6.0* 5.8* 5.6* 5.7* 5.9*  ALBUMIN 2.7* 2.5* 2.3* 2.4* 2.4*   Recent Labs  Lab 07/15/19 1829 07/17/19 0341  LIPASE 1,014* 310*   No results for input(s): AMMONIA in the last 168 hours. Coagulation Profile: No results for input(s): INR, PROTIME in the last 168 hours. Cardiac Enzymes: No results for input(s): CKTOTAL, CKMB, CKMBINDEX, TROPONINI in the last 168 hours. BNP (last 3 results) No results for input(s): PROBNP in the last 8760 hours. HbA1C: No results for input(s): HGBA1C in the last 72 hours. CBG: Recent Labs  Lab 07/17/19 0625 07/17/19  9381 07/17/19 0849 07/17/19 0932 07/17/19 1043  GLUCAP 146* 126* 132* 144* 159*   Lipid Profile: No results for input(s): CHOL, HDL, LDLCALC, TRIG, CHOLHDL, LDLDIRECT in the last 72 hours. Thyroid Function Tests: No results for input(s): TSH, T4TOTAL, FREET4, T3FREE, THYROIDAB in the last 72 hours. Anemia Panel: No results for input(s): VITAMINB12, FOLATE, FERRITIN, TIBC, IRON, RETICCTPCT in the last 72 hours. Sepsis Labs: Recent Labs  Lab 07/16/19 0845  PROCALCITON 0.44    Recent Results (from the past 240 hour(s))  MRSA PCR Screening     Status: None   Collection Time: 07/16/19  6:22 AM   Specimen: Nasal Mucosa; Nasopharyngeal  Result Value Ref Range Status   MRSA by PCR NEGATIVE NEGATIVE Final    Comment:        The GeneXpert MRSA Assay (FDA approved for NASAL specimens only), is one component of a comprehensive MRSA colonization surveillance program. It is not intended to diagnose MRSA infection nor to guide or monitor treatment for MRSA infections. Performed at [facility], [address], [place], [place] [address]           Radiology Studies: CT ABDOMEN PELVIS WO CONTRAST  Addendum Date: 07/16/2019   ADDENDUM REPORT: 07/16/2019 11:41 ADDENDUM: Upon review of the images and the report, correction is made to the body of the report. In the section discussing the pancreas, the third sentence should be corrected to state: "NO common bile duct stones are seen." This than matches the impression of the report, which indicates an absence of common bile duct stones. Remainder of report as previously dictated by original interpreting radiologist. Electronically Signed   By: Lavonia Dana M.D.   On: 07/16/2019 11:41   Result Date: 07/16/2019 CLINICAL DATA:  Abdominal pain EXAM: CT ABDOMEN AND PELVIS without CONTRAST TECHNIQUE: Multidetector CT imaging of the abdomen and pelvis was performed following the standard protocol without IV contrast. COMPARISON:  None. FINDINGS: Lower chest: The visualized heart size within normal limits. No pericardial fluid/thickening. There is a small hiatal hernia present. Contrast is seen refluxing within the distal esophagus. Increased interstitial markings and patchy airspace opacity seen at both lung bases. Hepatobiliary: The liver is normal in density without focal abnormality.The main portal vein is patent. No evidence of calcified gallstones, gallbladder wall thickening or biliary dilatation. Pancreas: There is diffuse mesenteric fat stranding changes seen surrounding the entirety of the pancreas, predominantly around the pancreatic head and proximal body. No definite pancreatic ductal dilatation is seen. Common bile duct stones are seen. No loculated fluid collections is seen. Scattered small lymph nodes are noted. Parenchymal enhancement evaluation is limited due to lack of intravenous contrast. Spleen: Normal in size without focal abnormality. Adrenals/Urinary Tract: Both adrenal glands appear normal. 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 Multiple bilateral renal calculi  are noted. The largest measuring 1.2 cm in the upper pole the right kidney. There is mild bilateral renal atrophy. There is a 1 cm low-density lesion seen off the upper pole of the left kidney. No hydronephrosis. The bladder is unremarkable. Stomach/Bowel: There is a small hiatal hernia. There is diffuse fat stranding changes and wall thickening seen around the second and third portion of the duodenum. The remainder of the small bowel is unremarkable.Scattered colonic diverticula are noted without diverticulitis. The appendix is normal in appearance. Vascular/Lymphatic: There are no enlarged mesenteric, retroperitoneal, or pelvic lymph nodes. Scattered aortic atherosclerotic calcifications are seen without aneurysmal dilatation. Reproductive: The prostate is unremarkable. Other: No  evidence of abdominal wall mass or hernia. Musculoskeletal: No acute or significant osseous findings. IMPRESSION: 1. Patchy/streaky airspace opacity at both lung bases which could be due to atelectasis or infectious etiology. 2. Findings consistent with diffuse extensive acute pancreatitis, most notable about the proximal body and pancreatic head. No definite loculated fluid collections or common bile duct stones. 3. Adjacent inflammatory changes involving the duodenum. 4. Multiple bilateral non-obstructing renal calculi. 5. Diverticulosis without diverticulitis. 6.  Aortic Atherosclerosis (ICD10-I70.0). Electronically Signed: By: Prudencio Pair M.D. On: 07/16/2019 01:32   DG Chest 2 View  Result Date: 07/15/2019 CLINICAL DATA:  Chest pain. EXAM: CHEST - 2 VIEW COMPARISON:  12/01/2013 FINDINGS: The cardiomediastinal silhouette is within normal limits. There are new mild opacities in both lung bases. The upper lungs are clear. No pleural effusion or pneumothorax is identified. No acute osseous abnormality is seen. IMPRESSION: New mild bibasilar opacities which could reflect early infection or atelectasis. Electronically Signed   By:  Logan Bores M.D.   On: 07/15/2019 18:58   DG Abdomen 1 View  Result Date: 07/15/2019 CLINICAL DATA:  Patient c/o chest and abdominal pain that started yesterday. Reports no BM x 1 week. EXAM: ABDOMEN - 1 VIEW COMPARISON:  Abdominal radiograph 09/21/2014, CT abdomen pelvis 09/09/2013 FINDINGS: There are no dilated loops of bowel to suggest obstruction. No supine evidence for free air. Moderate stool burden. Small radiopaque densities projecting over the bilateral kidneys mostly representing renal stones. Lung bases are excluded from field of view. No acute finding in the visualized skeleton. IMPRESSION: 1. Nonobstructive bowel gas pattern.  Moderate stool burden. 2. Bilateral renal calculi. Electronically Signed   By: Audie Pinto M.D.   On: 07/15/2019 19:20   US Abdomen Limited RUQ  Result Date: 07/16/2019 CLINICAL DATA:  Abdominal pain. EXAM: ULTRASOUND ABDOMEN LIMITED RIGHT UPPER QUADRANT COMPARISON:  CT abdomen pelvis 07/16/2019 FINDINGS: Gallbladder: No gallstones or wall thickening visualized. No sonographic Murphy sign noted by sonographer. Common bile duct: Diameter: 0.2 cm, within normal limits Liver: Poor visualization due to shadowing bowel gas and increased echogenicity. No definite focal lesion identified. The liver parenchymal echogenicity appears diffusely increased. Portal vein is patent on color Doppler imaging with normal direction of blood flow towards the liver. Other: None. IMPRESSION: 1.  No acute finding in the right upper quadrant. 2. Limited visualization of the liver due to shadowing bowel gas and increased echogenicity. Diffusely increased parenchymal echogenicity most commonly seen with hepatic steatosis. Electronically Signed   By: Audie Pinto M.D.   On: 07/16/2019 11:54        Scheduled Meds: . amLODipine  10 mg Oral Daily  . Chlorhexidine Gluconate Cloth  6 each Topical Daily  . feeding supplement (GLUCERNA SHAKE)  237 mL Oral TID BM  . heparin  5,000  Units Subcutaneous Q8H  . pantoprazole  40 mg Oral Daily   Continuous Infusions: . sodium chloride    . dextrose 5 % and 0.9% NaCl 75 mL/hr at 07/16/19 0925  . insulin 1 mL/hr at 07/17/19 1045     LOS: 1 day    Time spent: 30 minutes    Philip Richardson Darleen Crocker, DO [facility] Pager [phone]  If 7PM-7AM, please contact night-coverage [email or web address] Password [facility] 07/17/2019, 11:19 AM

## 2019-07-17 NOTE — Progress Notes (Signed)
Subjective: Feeling significantly better today than yesterday. Less abdominal pain, still with generalized soreness. Has needed pain medication, but effective for him. Denies N/V, hematochezia, melena. No fever, chills. Has intermittent GERD symptoms at home and was recently started on "an acid pill." Currently on po Protonix while inpatient. No other GI complaints.  Objective: Vital signs in last 24 hours: Temp:  [98.2 F (36.8 C)-99 F (37.2 C)] 98.5 F (36.9 C) (02/12 0800) Pulse Rate:  [98-118] 112 (02/12 1100) Resp:  [13-20] 20 (02/12 1100) BP: (123-160)/(72-90) 152/76 (02/12 1100) SpO2:  [81 %-100 %] 100 % (02/12 1100) Weight:  [79.6 kg] 79.6 kg (02/12 0500) Last BM Date: 06/25/19 General:   Alert and oriented, pleasant Head:  Normocephalic and atraumatic. Eyes:  No icterus, sclera clear. Conjuctiva pink.  Heart:  S1, S2 present, no murmurs noted.  Lungs: Clear to auscultation bilaterally, without wheezing, rales, or rhonchi.  Abdomen:  Bowel sounds present, soft, nnon-distended. Mild to moderate generalized TTP. No HSM or hernias noted. No rebound or guarding. No masses appreciated  Msk:  Symmetrical without gross deformities. Pulses:  Normal pulses noted. Extremities:  Without clubbing or edema. Neurologic:  Alert and  oriented x4;  grossly normal neurologically. Psych:  Alert and cooperative. Normal mood and affect.  Intake/Output from previous day: 02/11 0701 - 02/12 0700 In: 2171.7 [I.V.:2071.7; IV Piggyback:100] Out: 1761 [Urine:1275] Intake/Output this shift: Total I/O In: 5.7 [I.V.:5.7] Out: -   Lab Results: Recent Labs    07/15/19 1829 07/16/19 0342  WBC 12.9* 14.5*  HGB 12.4* 10.8*  HCT 37.0* 31.9*  PLT 339 226   BMET Recent Labs    07/16/19 2108 07/17/19 0038 07/17/19 0341  NA 132* 131* 133*  K 4.8 4.9 4.9  CL 105 104 104  CO2 21* 20* 21*  GLUCOSE 182* 167* 151*  BUN 27* 27* 24*  CREATININE 1.73* 1.76* 1.72*  CALCIUM 7.6* 7.7* 7.9*    LFT Recent Labs    07/15/19 1829 07/16/19 0342 07/16/19 2108 07/17/19 0038 07/17/19 0341  PROT 7.6   < > 5.6* 5.7* 5.9*  ALBUMIN 3.3*   < > 2.3* 2.4* 2.4*  AST 17   < > 21 20 22   ALT 28   < > 19 20 19   ALKPHOS 95   < > 68 70 64  BILITOT 0.9   < > 0.7 0.8 0.4  BILIDIR 0.2  --   --   --   --   IBILI 0.7  --   --   --   --    < > = values in this interval not displayed.   PT/INR No results for input(s): LABPROT, INR in the last 72 hours. Hepatitis Panel No results for input(s): HEPBSAG, HCVAB, HEPAIGM, HEPBIGM in the last 72 hours.   Studies/Results: CT ABDOMEN PELVIS WO CONTRAST  Addendum Date: 07/16/2019   ADDENDUM REPORT: 07/16/2019 11:41 ADDENDUM: Upon review of the images and the report, correction is made to the body of the report. In the section discussing the pancreas, the third sentence should be corrected to state: "NO common bile duct stones are seen." This than matches the impression of the report, which indicates an absence of common bile duct stones. Remainder of report as previously dictated by original interpreting radiologist. Electronically Signed   By: Lavonia Dana M.D.   On: 07/16/2019 11:41   Result Date: 07/16/2019 CLINICAL DATA:  Abdominal pain EXAM: CT ABDOMEN AND PELVIS without CONTRAST TECHNIQUE: Multidetector CT imaging of  the abdomen and pelvis was performed following the standard protocol without IV contrast. COMPARISON:  None. FINDINGS: Lower chest: The visualized heart size within normal limits. No pericardial fluid/thickening. There is a small hiatal hernia present. Contrast is seen refluxing within the distal esophagus. Increased interstitial markings and patchy airspace opacity seen at both lung bases. Hepatobiliary: The liver is normal in density without focal abnormality.The main portal vein is patent. No evidence of calcified gallstones, gallbladder wall thickening or biliary dilatation. Pancreas: There is diffuse mesenteric fat stranding changes seen  surrounding the entirety of the pancreas, predominantly around the pancreatic head and proximal body. No definite pancreatic ductal dilatation is seen. Common bile duct stones are seen. No loculated fluid collections is seen. Scattered small lymph nodes are noted. Parenchymal enhancement evaluation is limited due to lack of intravenous contrast. Spleen: Normal in size without focal abnormality. Adrenals/Urinary Tract: Both adrenal glands appear normal. 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 Multiple bilateral renal calculi are noted. The largest measuring 1.2 cm in the upper pole the right kidney. There is mild bilateral renal atrophy. There is a 1 cm low-density lesion seen off the upper pole of the left kidney. No hydronephrosis. The bladder is unremarkable. Stomach/Bowel: There is a small hiatal hernia. There is diffuse fat stranding changes and wall thickening seen around the second and third portion of the duodenum. The remainder of the small bowel is unremarkable.Scattered colonic diverticula are noted without diverticulitis. The appendix is normal in appearance. Vascular/Lymphatic: There are no enlarged mesenteric, retroperitoneal, or pelvic lymph nodes. Scattered aortic atherosclerotic calcifications are seen without aneurysmal dilatation. Reproductive: The prostate is unremarkable. Other: No evidence of abdominal wall mass or hernia. Musculoskeletal: No acute or significant osseous findings. IMPRESSION: 1. Patchy/streaky airspace opacity at both lung bases which could be due to atelectasis or infectious etiology. 2. Findings consistent with diffuse extensive acute pancreatitis, most notable about the proximal body and pancreatic head. No definite loculated fluid collections or common bile duct stones. 3. Adjacent inflammatory changes involving the duodenum. 4. Multiple bilateral non-obstructing renal calculi. 5. Diverticulosis without diverticulitis. 6.  Aortic Atherosclerosis  (ICD10-I70.0). Electronically Signed: By: Prudencio Pair M.D. On: 07/16/2019 01:32   DG Chest 2 View  Result Date: 07/15/2019 CLINICAL DATA:  Chest pain. EXAM: CHEST - 2 VIEW COMPARISON:  12/01/2013 FINDINGS: The cardiomediastinal silhouette is within normal limits. There are new mild opacities in both lung bases. The upper lungs are clear. No pleural effusion or pneumothorax is identified. No acute osseous abnormality is seen. IMPRESSION: New mild bibasilar opacities which could reflect early infection or atelectasis. Electronically Signed   By: Logan Bores M.D.   On: 07/15/2019 18:58   DG Abdomen 1 View  Result Date: 07/15/2019 CLINICAL DATA:  Patient c/o chest and abdominal pain that started yesterday. Reports no BM x 1 week. EXAM: ABDOMEN - 1 VIEW COMPARISON:  Abdominal radiograph 09/21/2014, CT abdomen pelvis 09/09/2013 FINDINGS: There are no dilated loops of bowel to suggest obstruction. No supine evidence for free air. Moderate stool burden. Small radiopaque densities projecting over the bilateral kidneys mostly representing renal stones. Lung bases are excluded from field of view. No acute finding in the visualized skeleton. IMPRESSION: 1. Nonobstructive bowel gas pattern.  Moderate stool burden. 2. Bilateral renal calculi. Electronically Signed   By: Audie Pinto M.D.   On: 07/15/2019 19:20   US Abdomen Limited RUQ  Result Date: 07/16/2019 CLINICAL DATA:  Abdominal pain. EXAM: ULTRASOUND ABDOMEN LIMITED RIGHT UPPER  QUADRANT COMPARISON:  CT abdomen pelvis 07/16/2019 FINDINGS: Gallbladder: No gallstones or wall thickening visualized. No sonographic Murphy sign noted by sonographer. Common bile duct: Diameter: 0.2 cm, within normal limits Liver: Poor visualization due to shadowing bowel gas and increased echogenicity. No definite focal lesion identified. The liver parenchymal echogenicity appears diffusely increased. Portal vein is patent on color Doppler imaging with normal direction of blood  flow towards the liver. Other: None. IMPRESSION: 1.  No acute finding in the right upper quadrant. 2. Limited visualization of the liver due to shadowing bowel gas and increased echogenicity. Diffusely increased parenchymal echogenicity most commonly seen with hepatic steatosis. Electronically Signed   By: Audie Pinto M.D.   On: 07/16/2019 11:54    Assessment: Pleasant 64 year old with history of diabetes, hypertension, GERD presenting for further evaluation of progressive epigastric pain associated with nausea, overall feeling poor for couple of weeks.  Patient initially presented to urgent care on January 22 with complaints of upper respiratory symptoms, tested negative for influenza and Covid at the time.  He eventually was retested on January 29 was Covid positive.  Over the past 2 days he has had progressive epigastric pain radiating into his back requiring ED evaluation.  Noted to have acute pancreatitis.  Unclear etiology at this point.  Recent triglycerides and calcium unremarkable.  No prior alcohol use. CT was personally reviewed with Dr. Thornton Papas yesterday and confirmed patient does not have CBD stones. CBD diameter 0.2cm on u/s and LFTs are normal.   Constipation likely multifactorial in setting of Covid, pancreatitis. Was provided dulcolax suppository yesterday, transition to miralax once he begins oral intake.   Labs today (early morning approximately 0345) show mildly improved BUN (34>31>27>24) with hydration. CBC not checked this morning. Lipase (also 0345) significantly improved from 1014 > 310. CBGs better controlled.  Urinary difficulty: Having urinary difficulty last evening. Per nursing staff patient has had output of about a liter overnight, appears resolved.   Overall improved today.  Plan: 1. Continue gentle hydration 2. Supportive care including pain management and fluids 3. Follow labs (lipase, CBC, metabolic panel tomorrow) 4. Start clear liquid diet at lunch, advance  as tolerated; if worsening symptoms return to NPO. 5. Will continue to follow along 6. Supportive measures   Thank you for allowing [place] to participate in the care of Jaconita, DNP, AGNP-C Adult & Gerontological Nurse Practitioner [facility]    LOS: 1 day    07/17/2019, 11:25 AM

## 2019-07-17 NOTE — Progress Notes (Signed)
Patients heart rate 115, MEWS score of 2. On call provider notified.

## 2019-07-18 ENCOUNTER — Inpatient Hospital Stay (HOSPITAL_COMMUNITY): Payer: BC Managed Care – PPO

## 2019-07-18 DIAGNOSIS — E1165 Type 2 diabetes mellitus with hyperglycemia: Secondary | ICD-10-CM | POA: Diagnosis not present

## 2019-07-18 DIAGNOSIS — R509 Fever, unspecified: Secondary | ICD-10-CM | POA: Diagnosis not present

## 2019-07-18 DIAGNOSIS — R748 Abnormal levels of other serum enzymes: Secondary | ICD-10-CM

## 2019-07-18 DIAGNOSIS — E1100 Type 2 diabetes mellitus with hyperosmolarity without nonketotic hyperglycemic-hyperosmolar coma (NKHHC): Secondary | ICD-10-CM | POA: Diagnosis not present

## 2019-07-18 LAB — CBC
HCT: 27.7 % — ABNORMAL LOW (ref 39.0–52.0)
Hemoglobin: 9.1 g/dL — ABNORMAL LOW (ref 13.0–17.0)
MCH: 27.5 pg (ref 26.0–34.0)
MCHC: 32.9 g/dL (ref 30.0–36.0)
MCV: 83.7 fL (ref 80.0–100.0)
Platelets: 257 10*3/uL (ref 150–400)
RBC: 3.31 MIL/uL — ABNORMAL LOW (ref 4.22–5.81)
RDW: 13.5 % (ref 11.5–15.5)
WBC: 21.7 10*3/uL — ABNORMAL HIGH (ref 4.0–10.5)
nRBC: 0 % (ref 0.0–0.2)

## 2019-07-18 LAB — URINALYSIS, ROUTINE W REFLEX MICROSCOPIC
Bilirubin Urine: NEGATIVE
Glucose, UA: >=500 mg/dL — AB
Ketones, ur: NEGATIVE mg/dL
Leukocytes,Ua: NEGATIVE
Nitrite: NEGATIVE
Protein, ur: 100 mg/dL — AB
Specific Gravity, Urine: 1.011 (ref 1.005–1.030)
pH: 5 (ref 5.0–8.0)

## 2019-07-18 LAB — BASIC METABOLIC PANEL
Anion gap: 9 (ref 5–15)
BUN: 16 mg/dL (ref 8–23)
CO2: 20 mmol/L — ABNORMAL LOW (ref 22–32)
Calcium: 7.9 mg/dL — ABNORMAL LOW (ref 8.9–10.3)
Chloride: 104 mmol/L (ref 98–111)
Creatinine, Ser: 1.67 mg/dL — ABNORMAL HIGH (ref 0.61–1.24)
GFR calc Af Amer: 50 mL/min — ABNORMAL LOW (ref >60)
GFR calc non Af Amer: 43 mL/min — ABNORMAL LOW (ref >60)
Glucose, Bld: 119 mg/dL — ABNORMAL HIGH (ref 70–99)
Potassium: 4 mmol/L (ref 3.5–5.1)
Sodium: 133 mmol/L — ABNORMAL LOW (ref 135–145)

## 2019-07-18 LAB — LACTIC ACID, PLASMA
Lactic Acid, Venous: 1.4 mmol/L (ref 0.5–1.9)
Lactic Acid, Venous: 1.4 mmol/L (ref 0.5–1.9)

## 2019-07-18 LAB — LIPASE, BLOOD: Lipase: 63 U/L — ABNORMAL HIGH (ref 11–51)

## 2019-07-18 LAB — GLUCOSE, CAPILLARY
Glucose-Capillary: 147 mg/dL — ABNORMAL HIGH (ref 70–99)
Glucose-Capillary: 148 mg/dL — ABNORMAL HIGH (ref 70–99)
Glucose-Capillary: 168 mg/dL — ABNORMAL HIGH (ref 70–99)
Glucose-Capillary: 189 mg/dL — ABNORMAL HIGH (ref 70–99)
Glucose-Capillary: 67 mg/dL — ABNORMAL LOW (ref 70–99)
Glucose-Capillary: 95 mg/dL (ref 70–99)

## 2019-07-18 LAB — PROCALCITONIN: Procalcitonin: 1.27 ng/mL

## 2019-07-18 MED ORDER — POLYETHYLENE GLYCOL 3350 17 G PO PACK
17.0000 g | PACK | Freq: Once | ORAL | Status: AC
  Administered 2019-07-18: 17 g via ORAL

## 2019-07-18 MED ORDER — PIPERACILLIN-TAZOBACTAM 3.375 G IVPB
3.3750 g | Freq: Three times a day (TID) | INTRAVENOUS | Status: DC
  Administered 2019-07-18 – 2019-07-21 (×11): 3.375 g via INTRAVENOUS
  Filled 2019-07-18 (×12): qty 50

## 2019-07-18 MED ORDER — POLYETHYLENE GLYCOL 3350 17 G PO PACK
255.0000 g | PACK | Freq: Once | ORAL | Status: DC
  Filled 2019-07-18: qty 15

## 2019-07-18 MED ORDER — INSULIN ASPART 100 UNIT/ML ~~LOC~~ SOLN
0.0000 [IU] | Freq: Three times a day (TID) | SUBCUTANEOUS | Status: DC
  Administered 2019-07-18: 09:00:00 3 [IU] via SUBCUTANEOUS
  Administered 2019-07-18 – 2019-07-19 (×2): 4 [IU] via SUBCUTANEOUS
  Administered 2019-07-20 – 2019-07-22 (×2): 7 [IU] via SUBCUTANEOUS
  Administered 2019-07-22: 4 [IU] via SUBCUTANEOUS

## 2019-07-18 MED ORDER — BISACODYL 10 MG RE SUPP
10.0000 mg | Freq: Once | RECTAL | Status: AC
  Administered 2019-07-18: 12:00:00 10 mg via RECTAL
  Filled 2019-07-18: qty 1

## 2019-07-18 MED ORDER — PANTOPRAZOLE SODIUM 40 MG PO TBEC
40.0000 mg | DELAYED_RELEASE_TABLET | Freq: Two times a day (BID) | ORAL | Status: DC
  Administered 2019-07-18 – 2019-07-22 (×7): 40 mg via ORAL
  Filled 2019-07-18 (×8): qty 1

## 2019-07-18 MED ORDER — HYDROMORPHONE HCL 1 MG/ML IJ SOLN
0.5000 mg | Freq: Four times a day (QID) | INTRAMUSCULAR | Status: DC
  Administered 2019-07-19 – 2019-07-20 (×4): 0.5 mg via INTRAVENOUS
  Filled 2019-07-18 (×5): qty 0.5

## 2019-07-18 MED ORDER — INSULIN ASPART 100 UNIT/ML ~~LOC~~ SOLN: 0.0000 [IU] | Freq: Every day | SUBCUTANEOUS | Status: DC

## 2019-07-18 MED ORDER — ACETAMINOPHEN 325 MG PO TABS
650.0000 mg | ORAL_TABLET | Freq: Four times a day (QID) | ORAL | Status: DC | PRN
  Administered 2019-07-18 – 2019-07-20 (×2): 650 mg via ORAL
  Filled 2019-07-18 (×2): qty 2

## 2019-07-18 MED ORDER — SODIUM CHLORIDE 0.9 % IV SOLN: INTRAVENOUS | Status: DC

## 2019-07-18 NOTE — Progress Notes (Signed)
  Assessment/Plan: ADMITTED WITH IDIOPATHIC PANCREATITIS. MORE PAIN AND BLOATING TODAY AFTER ATTEMPTING A FULL LIQUID DIET. WBC INCREASING. LIPASE IMPROVED.  PLAN: 1. CONTINUE FULL LIQUID DIET. 2. ATC PAIN MEDS AND PRN. 3. BID PPI. 4. CONSIDER BENEFITS V. RISKS OF ZOSYN. IT IS TOO EARLY FOR INFECTED PSEUDOCYST. MONITOR CBC AND TEMPERATURE.   Subjective: Since I last evaluated the patient HE HAS 7/10 ABDOMINAL PAIN AFTER FULL LIQUID DIET. STOMACH MORE DISTENDED. NO NAUSEA OR VOMITING. PASSING GAS. NO BM.  Objective: Vital signs in last 24 hours: Vitals:   07/18/19 0836 07/18/19 1406  BP: 119/67 (!) 151/77  Pulse: (!) 104 (!) 108  Resp:  19  Temp: 99.7 F (37.6 C) 100.1 F (37.8 C)  SpO2: 97% 97%   General appearance: alert, cooperative and MILD distress Resp: clear to auscultation bilaterally Cardio: regular rate and rhythm GI: soft, MILDLY tender IN EPIGASTRIUM, DISTENDED; NO REBOUND OR GUARDING; bowel sounds PRESENT  Lab Results:  WBC 21.7 Hb 9.1 PLT CT 257   Studies/Results: DG Chest 1 View  Result Date: 07/18/2019 CLINICAL DATA:  COVID-19 positivity with fevers EXAM: CHEST  1 VIEW COMPARISON:  07/15/2019 FINDINGS: Cardiac shadow is stable. Stable bibasilar opacities are seen. No focal confluent infiltrate is noted. No sizable effusion is seen. No bony abnormality is noted. IMPRESSION: No significant change from the prior exam. Electronically Signed   By: Inez Catalina M.D.   On: 07/18/2019 11:19    Medications: I have reviewed the patient's current medications.

## 2019-07-18 NOTE — Progress Notes (Signed)
PROGRESS NOTE    Philip Richardson  ZYS:[id number] DOB: 11-16-1955 DOA: 07/15/2019 PCP: Chevis Pretty, FNP   Brief Narrative:  Per HPI: Vimal Romingeris a 64 y.o.malewith medical history significant forhypertension, type 2 diabetes mellitus, hyperlipidemia, GERD andHorse shoe kidneywho presents to the emergency department due to upper abdominal pain with radiation to the back and lower chest area causing lower chest pain which started on Tuesday (2/9), abdominal pain was described as "gas-like"sensation and was rated as 8/10 on pain scale and was associated with decreased appetite. Patient states that he has not had any bowel movement in 1 week. Heis diabetic and he said that he has not checked his blood glucose level in 2 weeks. He denies fever, chills, headache, diaphoresis. Of note, patient states that he was recently treated for sinus infection which responds to antibiotics.  2/11:Patient was admitted with hyperosmolar hyperglycemic state in the setting of acute pancreatitis. He also has recent history of COVID-19 diagnosis on 1/29 at an urgent care facility. He is not hypoxemic, nor does he have any upper respiratory symptoms. He has been started on IV insulin infusion as well as IV fluids and continues to have abdominal pain complaints. GI consultation appreciated for the pancreatitis as there is questionable common bile duct stone present on imaging. He denies alcohol use, but could have some of his symptoms related to Covid. Continue n.p.o. status with close monitoring.  2/12: Patient has improved blood glucose readings and less abdominal pain noted this morning.  Awaiting GI recommendations for diet advancement and then will plan to stop insulin drip and convert to sliding scale.  2/13: Lipase levels of further down trended and blood glucose remained stable.  He is otherwise asymptomatic and has still not had a bowel movement.  Overnight he had developed a  temperature of 101.4 Fahrenheit and was noted to be tachycardic.  He was empirically started on IV Zosyn with blood cultures obtained and no growth noted thus far.  Will obtain urine analysis and chest x-ray as well.  Advance diet per GI.  Maintain on IV fluid for now.  Assessment & Plan:   Principal Problem:   Hyperosmolar hyperglycemic state (HHS) ([place]) Active Problems:   Hypertension   Dyslipidemia   Pancreatitis   Abdominal pain   GERD (gastroesophageal reflux disease)   Hyponatremia   Hypomagnesemia   Hypoalbuminemia   Hyperkalemia   Leukocytosis   Hyperglycemia due to diabetes mellitus (HCC)   AKI (acute kidney injury) ([place])   Constipation   Elevated lipase   Acute pancreatitis-idiopathic -Appears to be improving with downtrending lipase -Appreciate GI recommendations  -Plan to advance diet today -Maintain on IV fluid as ordered -Patient will likely require outpatient imaging studies as recommended in the near future  Hyperosmolar hyperglycemic state secondary to above -Blood glucose is now better controlled and patient has been transitioned off of IV insulin  Fever with tachycardia -Noted SIRS criteria without any definitive source of infection -Started on IV Zosyn empirically with blood cultures obtained and pending, but no growth thus far -We will obtain chest x-ray and urinalysis for completeness -Procalcitonin is elevated -Continue to monitor closely -Denies any significant symptomatology, but may consider repeat abdominal CT if this persists  Recent COVID-19 diagnosis -Patient appears asymptomatic from this currently does not require isolation -Diagnosed in urgent care facility 1/29  Essential hypertension-controlled -Continue home amlodipine -Hold lisinopril/HCTZ  Dyslipidemia -Holding statin for now  GERD -Continue PPI  Mild hyponatremia -Likely secondary to HCTZ use at home, recommend holding  for now -Continue on normal saline  AKI on  CKD stage IIIb -Appears to be resolved  -Continue to monitor repeat labs with IV fluid ongoing  Constipation -Administer suppository as well as MiraLAX today   DVT prophylaxis: Heparin Code Status: Full Family Communication: None at bedside Disposition Plan:  Continue to monitor fevers and maintain on IV Zosyn.  Appreciate further GI recommendations.  We will plan to advance diet further today.   Consultants:   GI  Procedures:   See studies below  Antimicrobials:  Anti-infectives (From admission, onward)   Start     Dose/Rate Route Frequency Ordered Stop   07/18/19 0630  piperacillin-tazobactam (ZOSYN) IVPB 3.375 g     3.375 g 12.5 mL/hr over 240 Minutes Intravenous Every 8 hours 07/18/19 0557         Subjective: Patient seen and evaluated today with no new acute complaints or concerns.  He was noted to develop a fever overnight as well as elevated heart rate.  Blood cultures were obtained and patient was started empirically on Zosyn.  No other complaints are otherwise noted.  He appears to be tolerating his full liquid diet okay and blood glucose remained stable.  He is currently afebrile.  Objective: Vitals:   07/18/19 0209 07/18/19 0307 07/18/19 0525 07/18/19 0836  BP: (!) 162/84  (!) 158/63 119/67  Pulse: (!) 128 (!) 121 (!) 134 (!) 104  Resp: 20  20   Temp: 100 F (37.8 C) 99.4 F (37.4 C) (!) 101.4 F (38.6 C) 99.7 F (37.6 C)  TempSrc: Oral Oral Oral Oral  SpO2: 97% 97% 97% 97%  Weight:      Height:        Intake/Output Summary (Last 24 hours) at 07/18/2019 1107 Last data filed at 07/18/2019 0900 Gross per 24 hour  Intake 244.66 ml  Output 200 ml  Net 44.66 ml   Filed Weights   07/15/19 1801 07/16/19 0635 07/17/19 0500  Weight: 84.4 kg 77.5 kg 79.6 kg    Examination:  General exam: Appears calm and comfortable  Respiratory system: Clear to auscultation. Respiratory effort normal. Cardiovascular system: S1 & S2 heard, RRR. No JVD, murmurs,  rubs, gallops or clicks. No pedal edema. Gastrointestinal system: Abdomen is nondistended, soft and nontender. No organomegaly or masses felt. Normal bowel sounds heard. Central nervous system: Alert and oriented. No focal neurological deficits. Extremities: Symmetric 5 x 5 power. Skin: No rashes, lesions or ulcers Psychiatry: Judgement and insight appear normal. Mood & affect appropriate.     Data Reviewed: I have personally reviewed following labs and imaging studies  CBC: Recent Labs  Lab 07/15/19 1829 07/16/19 0342 07/18/19 0704  WBC 12.9* 14.5* 21.7*  HGB 12.4* 10.8* 9.1*  HCT 37.0* 31.9* 27.7*  MCV 82.8 83.3 83.7  PLT 339 226 275   Basic Metabolic Panel: Recent Labs  Lab 07/16/19 0342 07/16/19 0845 07/16/19 1642 07/16/19 2108 07/17/19 0038 07/17/19 0341 07/18/19 0704  NA 129*   < > 131* 132* 131* 133* 133*  K 5.2*   < > 4.6 4.8 4.9 4.9 4.0  CL 98   < > 101 105 104 104 104  CO2 22   < > 23 21* 20* 21* 20*  GLUCOSE 229*   < > 162* 182* 167* 151* 119*  BUN 39*   < > 31* 27* 27* 24* 16  CREATININE 2.38*   < > 1.97* 1.73* 1.76* 1.72* 1.67*  CALCIUM 8.6*   < > 8.0* 7.6* 7.7* 7.9*  7.9*  MG 1.6*  --   --   --   --   --   --   PHOS 3.3  --   --   --   --   --   --    < > = values in this interval not displayed.   GFR: Estimated Creatinine Clearance: 51 mL/min (A) (by C-G formula based on SCr of 1.67 mg/dL (H)). Liver Function Tests: Recent Labs  Lab 07/16/19 1215 07/16/19 1642 07/16/19 2108 07/17/19 0038 07/17/19 0341  AST 22 20 21 20 22   ALT 18 20 19 20 19   ALKPHOS 74 71 68 70 64  BILITOT 0.7 0.8 0.7 0.8 0.4  PROT 6.0* 5.8* 5.6* 5.7* 5.9*  ALBUMIN 2.7* 2.5* 2.3* 2.4* 2.4*   Recent Labs  Lab 07/15/19 1829 07/17/19 0341 07/18/19 0704  LIPASE 1,014* 310* 63*   No results for input(s): AMMONIA in the last 168 hours. Coagulation Profile: No results for input(s): INR, PROTIME in the last 168 hours. Cardiac Enzymes: No results for input(s): CKTOTAL,  CKMB, CKMBINDEX, TROPONINI in the last 168 hours. BNP (last 3 results) No results for input(s): PROBNP in the last 8760 hours. HbA1C: Recent Labs    07/17/19 0341  HGBA1C 12.6*   CBG: Recent Labs  Lab 07/17/19 2153 07/18/19 0041 07/18/19 0621 07/18/19 0722 07/18/19 1101  GLUCAP 156* 148* 95 147* 67*   Lipid Profile: No results for input(s): CHOL, HDL, LDLCALC, TRIG, CHOLHDL, LDLDIRECT in the last 72 hours. Thyroid Function Tests: No results for input(s): TSH, T4TOTAL, FREET4, T3FREE, THYROIDAB in the last 72 hours. Anemia Panel: No results for input(s): VITAMINB12, FOLATE, FERRITIN, TIBC, IRON, RETICCTPCT in the last 72 hours. Sepsis Labs: Recent Labs  Lab 07/16/19 0845 07/18/19 0709 07/18/19 1006  PROCALCITON 0.44 1.27  --   LATICACIDVEN  --  1.4 1.4    Recent Results (from the past 240 hour(s))  MRSA PCR Screening     Status: None   Collection Time: 07/16/19  6:22 AM   Specimen: Nasal Mucosa; Nasopharyngeal  Result Value Ref Range Status   MRSA by PCR NEGATIVE NEGATIVE Final    Comment:        The GeneXpert MRSA Assay (FDA approved for NASAL specimens only), is one component of a comprehensive MRSA colonization surveillance program. It is not intended to diagnose MRSA infection nor to guide or monitor treatment for MRSA infections. Performed at [facility], [address] [place], [place] [address]   Culture, blood (routine x 2)     Status: None (Preliminary result)   Collection Time: 07/18/19  7:08 AM   Specimen: BLOOD RIGHT ARM  Result Value Ref Range Status   Specimen Description   Final    BLOOD RIGHT ARM BOTTLES DRAWN AEROBIC AND ANAEROBIC   Special Requests Blood Culture adequate volume  Final   Culture   Final    NO GROWTH < 12 HOURS Performed at [facility], [address] [place], [place] [address]    Report Status PENDING  Incomplete  Culture, blood (routine x 2)     Status: None (Preliminary result)   Collection Time: 07/18/19  7:16 AM     Specimen: Right Antecubital; Blood  Result Value Ref Range Status   Specimen Description   Final    RIGHT ANTECUBITAL BOTTLES DRAWN AEROBIC AND ANAEROBIC   Special Requests Blood Culture adequate volume  Final   Culture   Final    NO GROWTH < 12 HOURS Performed at  [facility], [address] [place], [place] [address]    Report Status PENDING  Incomplete         Radiology Studies: US Abdomen Limited RUQ  Result Date: 07/16/2019 CLINICAL DATA:  Abdominal pain. EXAM: ULTRASOUND ABDOMEN LIMITED RIGHT UPPER QUADRANT COMPARISON:  CT abdomen pelvis 07/16/2019 FINDINGS: Gallbladder: No gallstones or wall thickening visualized. No sonographic Murphy sign noted by sonographer. Common bile duct: Diameter: 0.2 cm, within normal limits Liver: Poor visualization due to shadowing bowel gas and increased echogenicity. No definite focal lesion identified. The liver parenchymal echogenicity appears diffusely increased. Portal vein is patent on color Doppler imaging with normal direction of blood flow towards the liver. Other: None. IMPRESSION: 1.  No acute finding in the right upper quadrant. 2. Limited visualization of the liver due to shadowing bowel gas and increased echogenicity. Diffusely increased parenchymal echogenicity most commonly seen with hepatic steatosis. Electronically Signed   By: Audie Pinto M.D.   On: 07/16/2019 11:54        Scheduled Meds: . amLODipine  10 mg Oral Daily  . bisacodyl  10 mg Rectal Once  . Chlorhexidine Gluconate Cloth  6 each Topical Daily  . feeding supplement (GLUCERNA SHAKE)  237 mL Oral TID BM  . heparin  5,000 Units Subcutaneous Q8H  . insulin aspart  0-20 Units Subcutaneous TID WC  . insulin aspart  0-5 Units Subcutaneous QHS  . insulin detemir  12 Units Subcutaneous BID  . pantoprazole  40 mg Oral QAC breakfast  . polyethylene glycol  255 g Oral Once   Continuous Infusions: . sodium chloride 125 mL/hr at 07/18/19 0213  . insulin Stopped  (07/17/19 1648)  . piperacillin-tazobactam (ZOSYN)  IV 3.375 g (07/18/19 0610)     LOS: 2 days    Time spent: 34 minutes    Layali Freund Darleen Crocker, DO [facility] Pager [phone]  If 7PM-7AM, please contact night-coverage [email or web address] Password [facility] 07/18/2019, 11:07 AM

## 2019-07-18 NOTE — Progress Notes (Signed)
Pharmacy Antibiotic Note  Philip Richardson is a 64 y.o. male admitted on 07/15/2019 with sepsis.  Pharmacy has been consulted for Zosyn dosing.  Plan:  Start Zosyn 3.375gm IV q8h (EI) Pharmacy will continue to monitor renal function,  cultures and patient progress.   Height: 6\' 1"  (185.4 cm) Weight: 175 lb 7.8 oz (79.6 kg) IBW/kg (Calculated) : 79.9  Temp (24hrs), Avg:99.4 F (37.4 C), Min:98.4 F (36.9 C), Max:101.4 F (38.6 C)  Recent Labs  Lab 07/15/19 1829 07/15/19 1829 07/16/19 0342 07/16/19 0845 07/16/19 1215 07/16/19 1642 07/16/19 2108 07/17/19 0038 07/17/19 0341  WBC 12.9*  --  14.5*  --   --   --   --   --   --   CREATININE 2.86*   < > 2.38*   < > 2.17* 1.97* 1.73* 1.76* 1.72*   < > = values in this interval not displayed.    Estimated Creatinine Clearance: 49.5 mL/min (A) (by C-G formula based on SCr of 1.72 mg/dL (H)).    Allergies  Allergen Reactions  . Invokana [Canagliflozin] Other (See Comments)    weakness    Antimicrobials this admission: Zosyn 2/13>      Microbiology results: 2/13 [facility] x2:   2/11 MRSA PCR: negative  Thank you for allowing pharmacy to be a part of this patient's care.  Despina Pole 07/18/2019 5:58 AM

## 2019-07-18 NOTE — Progress Notes (Signed)
Patient MEWS score of 4. Heart rate 134, temp 101.4, blood pressure 158/63, RR20 and O2 97% on room air. Spoke with MD Humphrey Rolls via phone and new orders placed for cardiac monitoring, BCx2, PRN Tylenol to be given now and Zosyn with Pharmacy to dose. Patient A&O x4 with no complaints at this time except for mild lower back pain which he states is improved from earlier.

## 2019-07-19 LAB — GLUCOSE, CAPILLARY
Glucose-Capillary: 94 mg/dL (ref 70–99)
Glucose-Capillary: 119 mg/dL — ABNORMAL HIGH (ref 70–99)
Glucose-Capillary: 164 mg/dL — ABNORMAL HIGH (ref 70–99)
Glucose-Capillary: 152 mg/dL — ABNORMAL HIGH (ref 70–99)

## 2019-07-19 LAB — CBC
HCT: 24 % — ABNORMAL LOW (ref 39.0–52.0)
Hemoglobin: 7.9 g/dL — ABNORMAL LOW (ref 13.0–17.0)
MCH: 27.7 pg (ref 26.0–34.0)
MCHC: 32.9 g/dL (ref 30.0–36.0)
MCV: 84.2 fL (ref 80.0–100.0)
Platelets: 230 10*3/uL (ref 150–400)
RBC: 2.85 MIL/uL — ABNORMAL LOW (ref 4.22–5.81)
RDW: 13.5 % (ref 11.5–15.5)
WBC: 13.4 10*3/uL — ABNORMAL HIGH (ref 4.0–10.5)
nRBC: 0 % (ref 0.0–0.2)

## 2019-07-19 LAB — BASIC METABOLIC PANEL
Anion gap: 9 (ref 5–15)
BUN: 14 mg/dL (ref 8–23)
CO2: 19 mmol/L — ABNORMAL LOW (ref 22–32)
Calcium: 7.6 mg/dL — ABNORMAL LOW (ref 8.9–10.3)
Chloride: 102 mmol/L (ref 98–111)
Creatinine, Ser: 1.59 mg/dL — ABNORMAL HIGH (ref 0.61–1.24)
GFR calc Af Amer: 53 mL/min — ABNORMAL LOW (ref >60)
GFR calc non Af Amer: 46 mL/min — ABNORMAL LOW (ref >60)
Glucose, Bld: 115 mg/dL — ABNORMAL HIGH (ref 70–99)
Potassium: 3.6 mmol/L (ref 3.5–5.1)
Sodium: 130 mmol/L — ABNORMAL LOW (ref 135–145)

## 2019-07-19 LAB — PROCALCITONIN: Procalcitonin: 1.26 ng/mL

## 2019-07-19 NOTE — Plan of Care (Signed)
  Problem: Education: Goal: Knowledge of General Education information will improve Description: Including pain rating scale, medication(s)/side effects and non-pharmacologic comfort measures Outcome: Progressing   Problem: Clinical Measurements: Goal: Will remain free from infection Outcome: Progressing Goal: Respiratory complications will improve Outcome: Progressing   Problem: Activity: Goal: Risk for activity intolerance will decrease Outcome: Progressing   Problem: Nutrition: Goal: Adequate nutrition will be maintained Outcome: Progressing   Problem: Elimination: Goal: Will not experience complications related to bowel motility Outcome: Progressing   Problem: Safety: Goal: Ability to remain free from injury will improve Outcome: Progressing   Problem: Skin Integrity: Goal: Risk for impaired skin integrity will decrease Outcome: Progressing

## 2019-07-19 NOTE — Progress Notes (Signed)
  Assessment/Plan: ADMITTED WITH IDIOPATHIC PANCREATITIS. CLINICALLY STABLE. PAIN CONTINUES BUT NOW MORE IN HIS BACK. WBC IMPROVED.  PLAN: 1. SUPPORTIVE CARE. CONTINUE MIVFs. 2. PAIN MEDS  3. PROTONIX BID 4. ZOFRAN PRN 5. FULL LIQUID DIET.  Subjective: Since I last evaluated the patient HE C/O 7/10 BACK PAIN. ABDOMINAL PAIN IMPROVED. NAUSEA/VOMITING RESOLVED. 1 WATERY STOOL TODAY AND 1 SOFT STOOL. FEELS A LITTLE BIT BETTER TODAY. PAIN IS CONTROLLED WITH MEDS.  Objective: Vital signs in last 24 hours: Vitals:   07/18/19 2053 07/19/19 0543  BP: 110/70 139/73  Pulse: (!) 129 (!) 109  Resp: 20 18  Temp: (!) 97.3 F (36.3 C) 100 F (37.8 C)  SpO2: 99% 96%   General appearance: alert, cooperative and no distress Resp: clear to auscultation bilaterally Cardio: regular rate and rhythm GI: soft, MILDLY tender IN EPIGASTRIUM, RUQ/RLQ, NO REBOUND OR GUARDING; bowel sounds normal;   Lab Results:  K 3.6 Cr 1.59 Hb 7.9 WBC 13.4   Studies/Results: No results found.  Medications: I have reviewed the patient's current medications.

## 2019-07-19 NOTE — Progress Notes (Signed)
PROGRESS NOTE    Philip Richardson  UMP:[id number] DOB: [birth year] DOA: 07/15/2019 PCP: Chevis Pretty, FNP   Brief Narrative:  Per HPI: Philip Romingeris a 64 y.o.malewith medical history significant forhypertension, type 2 diabetes mellitus, hyperlipidemia, GERD andHorse shoe kidneywho presents to the emergency department due to upper abdominal pain with radiation to the back and lower chest area causing lower chest pain which started on Tuesday (2/9), abdominal pain was described as "gas-like"sensation and was rated as 8/10 on pain scale and was associated with decreased appetite. Patient states that he has not had any bowel movement in 1 week. Heis diabetic and he said that he has not checked his blood glucose level in 2 weeks. He denies fever, chills, headache, diaphoresis. Of note, patient states that he was recently treated for sinus infection which responds to antibiotics.  2/11:Patient was admitted with hyperosmolar hyperglycemic state in the setting of acute pancreatitis. He also has recent history of COVID-19 diagnosis on 1/29 at an urgent care facility. He is not hypoxemic, nor does he have any upper respiratory symptoms. He has been started on IV insulin infusion as well as IV fluids and continues to have abdominal pain complaints. GI consultation appreciated for the pancreatitis as there is questionable common bile duct stone present on imaging. He denies alcohol use, but could have some of his symptoms related to Covid. Continue n.p.o. status with close monitoring.  2/12:Patient has improved blood glucose readings and less abdominal pain noted this morning. Awaiting GI recommendations for diet advancement and then will plan to stop insulin drip and convert to sliding scale.  2/13: Lipase levels of further down trended and blood glucose remained stable.  He is otherwise asymptomatic and has still not had a bowel movement.  Overnight he had developed a  temperature of 101.4 Fahrenheit and was noted to be tachycardic.  He was empirically started on IV Zosyn with blood cultures obtained and no growth noted thus far.  Will obtain urine analysis and chest x-ray as well.  Advance diet per GI.  Maintain on IV fluid for now.  2/14: Leukocytosis has improved, but patient still has low-grade temperatures as well as some tachycardia.  No growth on cultures or findings that are significant on urinalysis or chest x-ray.  Continue Zosyn for now.  He is back to full liquid diet as he had some persistent pain with eating a regular diet.  Appreciate further GI recommendations.  Recheck labs along with lipase in a.m.  Assessment & Plan:   Principal Problem:   Hyperosmolar hyperglycemic state (HHS) (Rule) Active Problems:   Hypertension   Dyslipidemia   Pancreatitis   Abdominal pain   GERD (gastroesophageal reflux disease)   Hyponatremia   Hypomagnesemia   Hypoalbuminemia   Hyperkalemia   Leukocytosis   Hyperglycemia due to diabetes mellitus (HCC)   AKI (acute kidney injury) ([place])   Constipation   Elevated lipase   Acute pancreatitis-idiopathic -Appears to be improving with downtrending lipase, recheck in a.m. -Appreciate GI recommendations  -Continue on full liquid diet for now -Maintain on IV fluid as ordered for now -Patient will likely require outpatient imaging studies as recommended in the near future  Hyperosmolar hyperglycemic state secondary to above-resolved -Continue current management  Fever with tachycardia-ongoing -Noted SIRS criteria without any definitive source of infection on chest x-ray, urine analysis, or blood cultures -Started on IV Zosyn empirically with blood cultures obtained and pending, but no growth thus far -Procalcitonin remains elevated -Leukocytosis is improving and CBC will be  reevaluated along with lactic acid in a.m. -Continue to monitor closely -Denies any significant symptomatology, but may consider  repeat abdominal CT if this persists  Recent COVID-19 diagnosis -Patient appears asymptomatic from this currently does not require isolation -Diagnosed in urgent care facility 1/29  Essential hypertension-controlled -Continue home amlodipine -Hold lisinopril/HCTZ  Dyslipidemia -Holding statin for now  GERD -Continue PPI  Mild hyponatremia -Likely secondary to HCTZ use at home, holding for now -Continue on normal saline at current rate  AKI on CKD stage IIIb -Steadily improving -Continue to monitor repeat labs with IV fluid ongoing  Constipation-improved -Patient noted to have a bowel movement on 2/13   DVT prophylaxis:Heparin Code Status:Full Family Communication:None at bedside Disposition Plan: Continue to monitor fevers and maintain on IV Zosyn.  Appreciate further GI recommendations.    Advance diet when able.   Consultants:  GI  Procedures:  See studies below  Antimicrobials:  Anti-infectives (From admission, onward)   Start     Dose/Rate Route Frequency Ordered Stop   07/18/19 0630  piperacillin-tazobactam (ZOSYN) IVPB 3.375 g     3.375 g 12.5 mL/hr over 240 Minutes Intravenous Every 8 hours 07/18/19 0557         Subjective: Patient seen and evaluated today with no new acute complaints or concerns. No acute concerns or events noted overnight.  He states that he did have some abdominal pain with diet advancement yesterday, but denies any at the moment.  He did have some low-grade fevers overnight as well.  He was noted to have a bowel movement yesterday after suppository and MiraLAX.  Objective: Vitals:   07/18/19 0836 07/18/19 1406 07/18/19 2053 07/19/19 0543  BP: 119/67 (!) 151/77 110/70 139/73  Pulse: (!) 104 (!) 108 (!) 129 (!) 109  Resp:  19 20 18   Temp: 99.7 F (37.6 C) 100.1 F (37.8 C) (!) 97.3 F (36.3 C) 100 F (37.8 C)  TempSrc: Oral Oral Oral Oral  SpO2: 97% 97% 99% 96%  Weight:      Height:         Intake/Output Summary (Last 24 hours) at 07/19/2019 1200 Last data filed at 07/19/2019 1133 Gross per 24 hour  Intake 1549.87 ml  Output 1600 ml  Net -50.13 ml   Filed Weights   07/15/19 1801 07/16/19 0635 07/17/19 0500  Weight: 84.4 kg 77.5 kg 79.6 kg    Examination:  General exam: Appears calm and comfortable  Respiratory system: Clear to auscultation. Respiratory effort normal. Cardiovascular system: S1 & S2 heard, RRR. No JVD, murmurs, rubs, gallops or clicks. No pedal edema. Gastrointestinal system: Abdomen is nondistended, soft and nontender. No organomegaly or masses felt. Normal bowel sounds heard. Central nervous system: Alert and oriented. No focal neurological deficits. Extremities: Symmetric 5 x 5 power. Skin: No rashes, lesions or ulcers Psychiatry: Judgement and insight appear normal. Mood & affect appropriate.     Data Reviewed: I have personally reviewed following labs and imaging studies  CBC: Recent Labs  Lab 07/15/19 1829 07/16/19 0342 07/18/19 0704 07/19/19 0634  WBC 12.9* 14.5* 21.7* 13.4*  HGB 12.4* 10.8* 9.1* 7.9*  HCT 37.0* 31.9* 27.7* 24.0*  MCV 82.8 83.3 83.7 84.2  PLT 339 226 257 833   Basic Metabolic Panel: Recent Labs  Lab 07/16/19 0342 07/16/19 0845 07/16/19 2108 07/17/19 0038 07/17/19 0341 07/18/19 0704 07/19/19 0634  NA 129*   < > 132* 131* 133* 133* 130*  K 5.2*   < > 4.8 4.9 4.9 4.0 3.6  CL  98   < > 105 104 104 104 102  CO2 22   < > 21* 20* 21* 20* 19*  GLUCOSE 229*   < > 182* 167* 151* 119* 115*  BUN 39*   < > 27* 27* 24* 16 14  CREATININE 2.38*   < > 1.73* 1.76* 1.72* 1.67* 1.59*  CALCIUM 8.6*   < > 7.6* 7.7* 7.9* 7.9* 7.6*  MG 1.6*  --   --   --   --   --   --   PHOS 3.3  --   --   --   --   --   --    < > = values in this interval not displayed.   GFR: Estimated Creatinine Clearance: 53.5 mL/min (A) (by C-G formula based on SCr of 1.59 mg/dL (H)). Liver Function Tests: Recent Labs  Lab 07/16/19 1215  07/16/19 1642 07/16/19 2108 07/17/19 0038 07/17/19 0341  AST 22 20 21 20 22   ALT 18 20 19 20 19   ALKPHOS 74 71 68 70 64  BILITOT 0.7 0.8 0.7 0.8 0.4  PROT 6.0* 5.8* 5.6* 5.7* 5.9*  ALBUMIN 2.7* 2.5* 2.3* 2.4* 2.4*   Recent Labs  Lab 07/15/19 1829 07/17/19 0341 07/18/19 0704  LIPASE 1,014* 310* 63*   No results for input(s): AMMONIA in the last 168 hours. Coagulation Profile: No results for input(s): INR, PROTIME in the last 168 hours. Cardiac Enzymes: No results for input(s): CKTOTAL, CKMB, CKMBINDEX, TROPONINI in the last 168 hours. BNP (last 3 results) No results for input(s): PROBNP in the last 8760 hours. HbA1C: Recent Labs    07/17/19 0341  HGBA1C 12.6*   CBG: Recent Labs  Lab 07/18/19 1101 07/18/19 1611 07/18/19 2109 07/19/19 0732 07/19/19 1118  GLUCAP 67* 168* 189* 94 119*   Lipid Profile: No results for input(s): CHOL, HDL, LDLCALC, TRIG, CHOLHDL, LDLDIRECT in the last 72 hours. Thyroid Function Tests: No results for input(s): TSH, T4TOTAL, FREET4, T3FREE, THYROIDAB in the last 72 hours. Anemia Panel: No results for input(s): VITAMINB12, FOLATE, FERRITIN, TIBC, IRON, RETICCTPCT in the last 72 hours. Sepsis Labs: Recent Labs  Lab 07/16/19 0845 07/18/19 0709 07/18/19 1006 07/19/19 0634  PROCALCITON 0.44 1.27  --  1.26  LATICACIDVEN  --  1.4 1.4  --     Recent Results (from the past 240 hour(s))  MRSA PCR Screening     Status: None   Collection Time: 07/16/19  6:22 AM   Specimen: Nasal Mucosa; Nasopharyngeal  Result Value Ref Range Status   MRSA by PCR NEGATIVE NEGATIVE Final    Comment:        The GeneXpert MRSA Assay (FDA approved for NASAL specimens only), is one component of a comprehensive MRSA colonization surveillance program. It is not intended to diagnose MRSA infection nor to guide or monitor treatment for MRSA infections. Performed at [facility], [address] [place], [place] [address]   Culture, blood (routine x 2)      Status: None (Preliminary result)   Collection Time: 07/18/19  7:08 AM   Specimen: BLOOD RIGHT ARM  Result Value Ref Range Status   Specimen Description   Final    BLOOD RIGHT ARM BOTTLES DRAWN AEROBIC AND ANAEROBIC   Special Requests Blood Culture adequate volume  Final   Culture   Final    NO GROWTH 1 DAY Performed at [facility], [address], [place], [place] [address]    Report Status PENDING  Incomplete  Culture, blood (routine  x 2)     Status: None (Preliminary result)   Collection Time: 07/18/19  7:16 AM   Specimen: Right Antecubital; Blood  Result Value Ref Range Status   Specimen Description   Final    RIGHT ANTECUBITAL BOTTLES DRAWN AEROBIC AND ANAEROBIC   Special Requests Blood Culture adequate volume  Final   Culture   Final    NO GROWTH 1 DAY Performed at [facility], [address], [place], [place] [address]    Report Status PENDING  Incomplete         Radiology Studies: DG Chest 1 View  Result Date: 07/18/2019 CLINICAL DATA:  COVID-19 positivity with fevers EXAM: CHEST  1 VIEW COMPARISON:  07/15/2019 FINDINGS: Cardiac shadow is stable. Stable bibasilar opacities are seen. No focal confluent infiltrate is noted. No sizable effusion is seen. No bony abnormality is noted. IMPRESSION: No significant change from the prior exam. Electronically Signed   By: Inez Catalina M.D.   On: 07/18/2019 11:19        Scheduled Meds: . amLODipine  10 mg Oral Daily  . feeding supplement (GLUCERNA SHAKE)  237 mL Oral TID BM  . heparin  5,000 Units Subcutaneous Q8H  .  HYDROmorphone (DILAUDID) injection  0.5 mg Intravenous Q6H  . insulin aspart  0-20 Units Subcutaneous TID WC  . insulin aspart  0-5 Units Subcutaneous QHS  . insulin detemir  12 Units Subcutaneous BID  . pantoprazole  40 mg Oral BID AC   Continuous Infusions: . sodium chloride 75 mL/hr at 07/18/19 1740  . insulin Stopped (07/17/19 1648)  . piperacillin-tazobactam (ZOSYN)  IV 3.375 g (07/19/19 0556)      LOS: 3 days    Time spent: 30 minutes    Jazline Cumbee Darleen Crocker, DO [facility] Pager [phone]  If 7PM-7AM, please contact night-coverage [email or web address] Password [email or web address] 07/19/2019, 12:00 PM

## 2019-07-20 LAB — PROCALCITONIN: Procalcitonin: 1.11 ng/mL

## 2019-07-20 LAB — COMPREHENSIVE METABOLIC PANEL
ALT: 37 U/L (ref 0–44)
AST: 40 U/L (ref 15–41)
Albumin: 1.7 g/dL — ABNORMAL LOW (ref 3.5–5.0)
Alkaline Phosphatase: 132 U/L — ABNORMAL HIGH (ref 38–126)
Anion gap: 8 (ref 5–15)
BUN: 9 mg/dL (ref 8–23)
CO2: 20 mmol/L — ABNORMAL LOW (ref 22–32)
Calcium: 7.6 mg/dL — ABNORMAL LOW (ref 8.9–10.3)
Chloride: 102 mmol/L (ref 98–111)
Creatinine, Ser: 1.55 mg/dL — ABNORMAL HIGH (ref 0.61–1.24)
GFR calc Af Amer: 54 mL/min — ABNORMAL LOW (ref >60)
GFR calc non Af Amer: 47 mL/min — ABNORMAL LOW (ref >60)
Glucose, Bld: 62 mg/dL — ABNORMAL LOW (ref 70–99)
Potassium: 3.1 mmol/L — ABNORMAL LOW (ref 3.5–5.1)
Sodium: 130 mmol/L — ABNORMAL LOW (ref 135–145)
Total Bilirubin: 0.7 mg/dL (ref 0.3–1.2)
Total Protein: 5.2 g/dL — ABNORMAL LOW (ref 6.5–8.1)

## 2019-07-20 LAB — CBC
HCT: 23.2 % — ABNORMAL LOW (ref 39.0–52.0)
Hemoglobin: 7.7 g/dL — ABNORMAL LOW (ref 13.0–17.0)
MCH: 27.7 pg (ref 26.0–34.0)
MCHC: 33.2 g/dL (ref 30.0–36.0)
MCV: 83.5 fL (ref 80.0–100.0)
Platelets: 212 10*3/uL (ref 150–400)
RBC: 2.78 MIL/uL — ABNORMAL LOW (ref 4.22–5.81)
RDW: 13.2 % (ref 11.5–15.5)
WBC: 12.6 10*3/uL — ABNORMAL HIGH (ref 4.0–10.5)
nRBC: 0 % (ref 0.0–0.2)

## 2019-07-20 LAB — GLUCOSE, CAPILLARY
Glucose-Capillary: 52 mg/dL — ABNORMAL LOW (ref 70–99)
Glucose-Capillary: 222 mg/dL — ABNORMAL HIGH (ref 70–99)
Glucose-Capillary: 69 mg/dL — ABNORMAL LOW (ref 70–99)
Glucose-Capillary: 111 mg/dL — ABNORMAL HIGH (ref 70–99)

## 2019-07-20 LAB — LACTIC ACID, PLASMA: Lactic Acid, Venous: 0.6 mmol/L (ref 0.5–1.9)

## 2019-07-20 LAB — LIPASE, BLOOD: Lipase: 25 U/L (ref 11–51)

## 2019-07-20 MED ORDER — ALUM & MAG HYDROXIDE-SIMETH 200-200-20 MG/5ML PO SUSP
30.0000 mL | ORAL | Status: DC | PRN
  Administered 2019-07-20: 30 mL via ORAL
  Filled 2019-07-20: qty 30

## 2019-07-20 MED ORDER — POLYETHYLENE GLYCOL 3350 17 G PO PACK
17.0000 g | PACK | Freq: Every day | ORAL | Status: DC
  Administered 2019-07-20 – 2019-07-22 (×2): 17 g via ORAL
  Filled 2019-07-20 (×2): qty 1

## 2019-07-20 MED ORDER — BISACODYL 10 MG RE SUPP
10.0000 mg | Freq: Once | RECTAL | Status: AC
  Administered 2019-07-20: 10 mg via RECTAL
  Filled 2019-07-20: qty 1

## 2019-07-20 MED ORDER — POTASSIUM CHLORIDE CRYS ER 20 MEQ PO TBCR
40.0000 meq | EXTENDED_RELEASE_TABLET | Freq: Two times a day (BID) | ORAL | Status: DC
  Administered 2019-07-20 – 2019-07-22 (×5): 40 meq via ORAL
  Filled 2019-07-20 (×5): qty 2
  Filled 2019-07-20: qty 4

## 2019-07-20 NOTE — Progress Notes (Signed)
    Subjective: Had episode of heartburn this morning but eased after Maalox. Abdominal pain improved from yesterday. No nausea. Feels better than yesterday.   Objective: Vital signs in last 24 hours: Temp:  [98.7 F (37.1 C)-99.3 F (37.4 C)] 98.7 F (37.1 C) (02/15 0432) Pulse Rate:  [97-106] 97 (02/15 0432) Resp:  [16-18] 18 (02/15 0432) BP: (128-134)/(70-73) 128/73 (02/15 0432) SpO2:  [98 %] 98 % (02/15 0432) Weight:  [83.9 kg] 83.9 kg (02/15 0432) Last BM Date: 07/18/19(pt states "small" bowel movement) General:   Alert and oriented, pleasant Head:  Normocephalic and atraumatic. Abdomen:  Bowel sounds present, soft, non-tender, non-distended.  Extremities:  Without  edema. Neurologic:  Alert and  oriented x4  Intake/Output from previous day: 02/14 0701 - 02/15 0700 In: 500.5 [P.O.:240; IV Piggyback:260.5] Out: 1250 [Urine:1250] Intake/Output this shift: No intake/output data recorded.  Lab Results: Recent Labs    07/18/19 0704 07/19/19 0634 07/20/19 0715  WBC 21.7* 13.4* 12.6*  HGB 9.1* 7.9* 7.7*  HCT 27.7* 24.0* 23.2*  PLT 257 230 212   BMET Recent Labs    07/18/19 0704 07/19/19 0634  NA 133* 130*  K 4.0 3.6  CL 104 102  CO2 20* 19*  GLUCOSE 119* 115*  BUN 16 14  CREATININE 1.67* 1.59*  CALCIUM 7.9* 7.6*   Lab Results  Component Value Date   ALT 37 07/20/2019   AST 40 07/20/2019   ALKPHOS 132 (H) 07/20/2019   BILITOT 0.7 07/20/2019   Lab Results  Component Value Date   LIPASE 25 07/20/2019      Studies/Results: DG Chest 1 View  Result Date: 07/18/2019 CLINICAL DATA:  COVID-19 positivity with fevers EXAM: CHEST  1 VIEW COMPARISON:  07/15/2019 FINDINGS: Cardiac shadow is stable. Stable bibasilar opacities are seen. No focal confluent infiltrate is noted. No sizable effusion is seen. No bony abnormality is noted. IMPRESSION: No significant change from the prior exam. Electronically Signed   By: Inez Catalina M.D.   On: 07/18/2019 11:19     Assessment: 64 year old male admitted with idiopathic pancreatitis, developing fever and leukocytosis over the weekend but now trending down with continued improvement in leukocytosis, Tmax 99.3 in past 24 hours, and pain improved since admission. He reports feeling improved today from over weekend and tolerating diet. Zosyn empirically started as well on 2/13. Hold off on repeat imaging unless worsening abdominal pain. Renal function continues to slowly improve.   Hypokalemia: per hospitalist.    Plan: Continue dysphagia 2 diet Protonix BID Will need dedicated outpatient repeat imaging of pancreas (CT with pancreatic protocol or MRI) in 6-8 weeks Continue supportive measures for now   Annitta Needs, PhD, ANP-BC [facility]      LOS: 4 days    07/20/2019, 7:50 AM

## 2019-07-20 NOTE — Progress Notes (Signed)
PROGRESS NOTE    Adnan Vanvoorhis  SEG:[id number] DOB: 05/31/1956 DOA: 07/15/2019 PCP: Chevis Pretty, FNP   Brief Narrative:  Per HPI: Nikai Romingeris a 64 y.o.malewith medical history significant forhypertension, type 2 diabetes mellitus, hyperlipidemia, GERD andHorse shoe kidneywho presents to the emergency department due to upper abdominal pain with radiation to the back and lower chest area causing lower chest pain which started on Tuesday (2/9), abdominal pain was described as "gas-like"sensation and was rated as 8/10 on pain scale and was associated with decreased appetite. Patient states that he has not had any bowel movement in 1 week. Heis diabetic and he said that he has not checked his blood glucose level in 2 weeks. He denies fever, chills, headache, diaphoresis. Of note, patient states that he was recently treated for sinus infection which responds to antibiotics.  2/11:Patient was admitted with hyperosmolar hyperglycemic state in the setting of acute pancreatitis. He also has recent history of COVID-19 diagnosis on 1/29 at an urgent care facility. He is not hypoxemic, nor does he have any upper respiratory symptoms. He has been started on IV insulin infusion as well as IV fluids and continues to have abdominal pain complaints. GI consultation appreciated for the pancreatitis as there is questionable common bile duct stone present on imaging. He denies alcohol use, but could have some of his symptoms related to Covid. Continue n.p.o. status with close monitoring.  2/12:Patient has improved blood glucose readings and less abdominal pain noted this morning. Awaiting GI recommendations for diet advancement and then will plan to stop insulin drip and convert to sliding scale.  2/13: Lipase levels of further down trended and blood glucose remained stable. He is otherwise asymptomatic and has still not had a bowel movement. Overnight he had developed a  temperature of 101.4 Fahrenheit and was noted to be tachycardic. He was empirically started on IV Zosyn with blood cultures obtained and no growth noted thus far. Will obtain urine analysis and chest x-ray as well. Advance diet per GI. Maintain on IV fluid for now.  2/14: Leukocytosis has improved, but patient still has low-grade temperatures as well as some tachycardia.  No growth on cultures or findings that are significant on urinalysis or chest x-ray.  Continue Zosyn for now.  He is back to full liquid diet as he had some persistent pain with eating a regular diet.  Appreciate further GI recommendations.  Recheck labs along with lipase in a.m.  2/15: Leukocytosis continues to improve and patient has improvements in his fever noted.  He continues to remain on Zosyn and has been switched to dysphagia 2 diet.  Plan to continue current treatments per GI recommendations and very slowly advance.  Supplement potassium and continue IV fluid.  Consider repeat CT imaging as needed for worsening clinical symptoms.  Assessment & Plan:   Principal Problem:   Hyperosmolar hyperglycemic state (HHS) ([place]) Active Problems:   Hypertension   Dyslipidemia   Pancreatitis   Abdominal pain   GERD (gastroesophageal reflux disease)   Hyponatremia   Hypomagnesemia   Hypoalbuminemia   Hyperkalemia   Leukocytosis   Hyperglycemia due to diabetes mellitus (HCC)   AKI (acute kidney injury) ([place])   Constipation   Elevated lipase   Acute pancreatitis-idiopathic -Appears to be improving with downtrending lipase, recheck in a.m. -Appreciate GI recommendations -Continue on dysphagia 2 diet as ordered -Maintain on IV fluidas ordered for now -Patient will likely require outpatient imaging studies as recommended in the near future  Hyperosmolar hyperglycemic state secondary  to above-resolved -Continue current management -We will not be started on insulin as it appears that he had hyperglycemia related to  recent steroid use -Hemoglobin A1c 12.6% which will need close follow-up  Fever with tachycardia-currently resolved -Noted SIRS criteria without any definitive source of infection on chest x-ray, urine analysis, or blood cultures -Started on IV Zosyn empirically with blood cultures obtained and pending, but no growth thus far -Procalcitonin remains elevated -Leukocytosis is improving and CBC will be reevaluated in a.m. -Continue to monitor closely -Denies any significant symptomatology, but may consider repeat abdominal CT if this persists  Recent COVID-19 diagnosis -Patient appears asymptomatic from this currently does not require isolation -Diagnosed in urgent care facility 1/29  Essential hypertension-controlled -Continue home amlodipine -Hold lisinopril/HCTZ  Dyslipidemia -Holding statin for now  GERD -Continue PPI, currently twice daily  Mild hyponatremia-stable -Likely secondary to HCTZ use at home, holding for now -Continue onnormal saline at current rate  AKI on CKD stage IIIb -Steadily improving -Continue to monitor repeat labs with IV fluid ongoing  Constipation-improved -Patient noted to have a bowel movement on 2/13 -Repeat suppository and MiraLAX today   DVT prophylaxis:Heparin Code Status:Full Family Communication:None at bedside Disposition Plan:Continue to monitor fevers and maintain on IV Zosyn. Appreciate further GI recommendations.  Advance diet when able.   Consultants:  GI  Procedures:  See studies below  Antimicrobials:  Anti-infectives (From admission, onward)   Start     Dose/Rate Route Frequency Ordered Stop   07/18/19 0630  piperacillin-tazobactam (ZOSYN) IVPB 3.375 g     3.375 g 12.5 mL/hr over 240 Minutes Intravenous Every 8 hours 07/18/19 0557         Subjective: Patient seen and evaluated today with no new acute complaints or concerns. No acute concerns or events noted overnight.  He was noted to have  some indigestion overnight which has improved with use of Maalox.  He still has some epigastric abdominal pain, but this has not worsened.  Objective: Vitals:   07/19/19 0543 07/19/19 1307 07/19/19 2035 07/20/19 0432  BP: 139/73 134/70 129/71 128/73  Pulse: (!) 109 (!) 106 100 97  Resp: 18  16 18   Temp: 100 F (37.8 C) 98.8 F (37.1 C) 99.3 F (37.4 C) 98.7 F (37.1 C)  TempSrc: Oral Oral Oral Oral  SpO2: 96% 98% 98% 98%  Weight:    83.9 kg  Height:        Intake/Output Summary (Last 24 hours) at 07/20/2019 1204 Last data filed at 07/20/2019 1100 Gross per 24 hour  Intake 860.49 ml  Output 1700 ml  Net -839.51 ml   Filed Weights   07/16/19 0635 07/17/19 0500 07/20/19 0432  Weight: 77.5 kg 79.6 kg 83.9 kg    Examination:  General exam: Appears calm and comfortable  Respiratory system: Clear to auscultation. Respiratory effort normal. Cardiovascular system: S1 & S2 heard, RRR. No JVD, murmurs, rubs, gallops or clicks. No pedal edema. Gastrointestinal system: Abdomen is nondistended, soft and minimally tender to palpation in epigastric region. No organomegaly or masses felt. Normal bowel sounds heard. Central nervous system: Alert and oriented. No focal neurological deficits. Extremities: Symmetric 5 x 5 power. Skin: No rashes, lesions or ulcers Psychiatry: Judgement and insight appear normal. Mood & affect appropriate.     Data Reviewed: I have personally reviewed following labs and imaging studies  CBC: Recent Labs  Lab 07/15/19 1829 07/16/19 0342 07/18/19 0704 07/19/19 0634 07/20/19 0715  WBC 12.9* 14.5* 21.7* 13.4* 12.6*  HGB 12.4*  10.8* 9.1* 7.9* 7.7*  HCT 37.0* 31.9* 27.7* 24.0* 23.2*  MCV 82.8 83.3 83.7 84.2 83.5  PLT 339 226 257 230 295   Basic Metabolic Panel: Recent Labs  Lab 07/16/19 0342 07/16/19 0845 07/17/19 0038 07/17/19 0341 07/18/19 0704 07/19/19 0634 07/20/19 0715  NA 129*   < > 131* 133* 133* 130* 130*  K 5.2*   < > 4.9 4.9 4.0 3.6  3.1*  CL 98   < > 104 104 104 102 102  CO2 22   < > 20* 21* 20* 19* 20*  GLUCOSE 229*   < > 167* 151* 119* 115* 62*  BUN 39*   < > 27* 24* 16 14 9   CREATININE 2.38*   < > 1.76* 1.72* 1.67* 1.59* 1.55*  CALCIUM 8.6*   < > 7.7* 7.9* 7.9* 7.6* 7.6*  MG 1.6*  --   --   --   --   --   --   PHOS 3.3  --   --   --   --   --   --    < > = values in this interval not displayed.   GFR: Estimated Creatinine Clearance: 55.1 mL/min (A) (by C-G formula based on SCr of 1.55 mg/dL (H)). Liver Function Tests: Recent Labs  Lab 07/16/19 1642 07/16/19 2108 07/17/19 0038 07/17/19 0341 07/20/19 0715  AST 20 21 20 22  40  ALT 20 19 20 19  37  ALKPHOS 71 68 70 64 132*  BILITOT 0.8 0.7 0.8 0.4 0.7  PROT 5.8* 5.6* 5.7* 5.9* 5.2*  ALBUMIN 2.5* 2.3* 2.4* 2.4* 1.7*   Recent Labs  Lab 07/15/19 1829 07/17/19 0341 07/18/19 0704 07/20/19 0715  LIPASE 1,014* 310* 63* 25   No results for input(s): AMMONIA in the last 168 hours. Coagulation Profile: No results for input(s): INR, PROTIME in the last 168 hours. Cardiac Enzymes: No results for input(s): CKTOTAL, CKMB, CKMBINDEX, TROPONINI in the last 168 hours. BNP (last 3 results) No results for input(s): PROBNP in the last 8760 hours. HbA1C: No results for input(s): HGBA1C in the last 72 hours. CBG: Recent Labs  Lab 07/19/19 1118 07/19/19 1626 07/19/19 2036 07/20/19 0750 07/20/19 1123  GLUCAP 119* 164* 152* 52* 222*   Lipid Profile: No results for input(s): CHOL, HDL, LDLCALC, TRIG, CHOLHDL, LDLDIRECT in the last 72 hours. Thyroid Function Tests: No results for input(s): TSH, T4TOTAL, FREET4, T3FREE, THYROIDAB in the last 72 hours. Anemia Panel: No results for input(s): VITAMINB12, FOLATE, FERRITIN, TIBC, IRON, RETICCTPCT in the last 72 hours. Sepsis Labs: Recent Labs  Lab 07/16/19 0845 07/18/19 0709 07/18/19 1006 07/19/19 0634 07/20/19 0715  PROCALCITON 0.44 1.27  --  1.26 1.11  LATICACIDVEN  --  1.4 1.4  --  0.6    Recent Results  (from the past 240 hour(s))  MRSA PCR Screening     Status: None   Collection Time: 07/16/19  6:22 AM   Specimen: Nasal Mucosa; Nasopharyngeal  Result Value Ref Range Status   MRSA by PCR NEGATIVE NEGATIVE Final    Comment:        The GeneXpert MRSA Assay (FDA approved for NASAL specimens only), is one component of a comprehensive MRSA colonization surveillance program. It is not intended to diagnose MRSA infection nor to guide or monitor treatment for MRSA infections. Performed at [facility], [address], [place], [place] [address]   Culture, blood (routine x 2)     Status: None (Preliminary result)   Collection Time: 07/18/19  7:08 AM   Specimen: BLOOD RIGHT ARM  Result Value Ref Range Status   Specimen Description   Final    BLOOD RIGHT ARM BOTTLES DRAWN AEROBIC AND ANAEROBIC   Special Requests Blood Culture adequate volume  Final   Culture   Final    NO GROWTH 2 DAYS Performed at [facility], [address], [place], [place] [address]    Report Status PENDING  Incomplete  Culture, blood (routine x 2)     Status: None (Preliminary result)   Collection Time: 07/18/19  7:16 AM   Specimen: Right Antecubital; Blood  Result Value Ref Range Status   Specimen Description   Final    RIGHT ANTECUBITAL BOTTLES DRAWN AEROBIC AND ANAEROBIC   Special Requests Blood Culture adequate volume  Final   Culture   Final    NO GROWTH 2 DAYS Performed at [facility], [address], [place], [place] [address]    Report Status PENDING  Incomplete         Radiology Studies: No results found.      Scheduled Meds: . amLODipine  10 mg Oral Daily  . bisacodyl  10 mg Rectal Once  . feeding supplement (GLUCERNA SHAKE)  237 mL Oral TID BM  . heparin  5,000 Units Subcutaneous Q8H  .  HYDROmorphone (DILAUDID) injection  0.5 mg Intravenous Q6H  . insulin aspart  0-20 Units Subcutaneous TID WC  . insulin aspart  0-5 Units Subcutaneous QHS  . insulin detemir  12 Units Subcutaneous  BID  . pantoprazole  40 mg Oral BID AC  . polyethylene glycol  17 g Oral Daily  . potassium chloride  40 mEq Oral BID   Continuous Infusions: . sodium chloride Stopped (07/19/19 2207)  . insulin Stopped (07/17/19 1648)  . piperacillin-tazobactam (ZOSYN)  IV 3.375 g (07/20/19 0519)     LOS: 4 days    Time spent: 30 minutes    Ashwini Jago Darleen Crocker, DO [facility] Pager [phone]  If 7PM-7AM, please contact night-coverage [email or web address] Password [facility] 07/20/2019, 12:04 PM

## 2019-07-20 NOTE — Plan of Care (Signed)
  Problem: Education: °Goal: Knowledge of General Education information will improve °Description: Including pain rating scale, medication(s)/side effects and non-pharmacologic comfort measures °Outcome: Progressing °  °Problem: Clinical Measurements: °Goal: Ability to maintain clinical measurements within normal limits will improve °Outcome: Progressing °Goal: Will remain free from infection °Outcome: Progressing °  °Problem: Activity: °Goal: Risk for activity intolerance will decrease °Outcome: Progressing °  °Problem: Nutrition: °Goal: Adequate nutrition will be maintained °Outcome: Progressing °  °Problem: Elimination: °Goal: Will not experience complications related to bowel motility °Outcome: Progressing °  °Problem: Pain Managment: °Goal: General experience of comfort will improve °Outcome: Progressing °  °Problem: Safety: °Goal: Ability to remain free from injury will improve °Outcome: Progressing °  °Problem: Skin Integrity: °Goal: Risk for impaired skin integrity will decrease °Outcome: Progressing °  °

## 2019-07-20 NOTE — Progress Notes (Signed)
Inpatient Diabetes Program Recommendations  AACE/ADA: New Consensus Statement on Inpatient Glycemic Control   Target Ranges:  Prepandial:   less than 140 mg/dL      Peak postprandial:   less than 180 mg/dL (1-2 hours)      Critically ill patients:  140 - 180 mg/dL  Results for Philip Richardson, Philip Richardson (MRN [id number]) as of 07/20/2019 10:23  Ref. Range 07/19/2019 07:32 07/19/2019 11:18 07/19/2019 16:26 07/19/2019 20:36 07/20/2019 07:50  Glucose-Capillary Latest Ref Range: 70 - 99 mg/dL 94 119 (H) 164 (H) 152 (H) 52 (L)   Results for Philip Richardson, Philip Richardson (MRN [id number]) as of 07/20/2019 10:23  Ref. Range 07/17/2019 03:41  Hemoglobin A1C Latest Ref Range: 4.8 - 5.6 % 12.6 (H)   Review of Glycemic Control  Diabetes history: DM2 Outpatient Diabetes medications: Amaryl 4 mg daily, Metformin 1000 mg BID Current orders for Inpatient glycemic control: Levemir 12 units BID, Novolog 0-20 units TID with meals, Novolog 0-5 units QHS  Inpatient Diabetes Program Recommendations:   Insulin - Basal: Fasting glucose 52 mg/dl this morning. Please consider decreasing Levemir to 10 units QHS.  HgbA1C: A1C 12.6% on 07/17/19 indicating an average glucose of 315 mg/dl over the past 2-3 months.  NOTE: Per chart, patient has DM2 hx and has Amaryl and Metformin listed on home medication list as DM medications. Patient sees Mary-Margaret Hassell Done, NP for PCP; noted telephone note on 07/07/19 in which patient requested Prednisone, Z-pack, inhaler, cough medicine with codeine and nasal spray and Rx for meds were sent to patient's pharmacy. Spoke with patient over the phone. Patient states that he has been taking Amaryl 4 mg daily and Metformin 1000 mg BID for DM as an outpatient. Patient reports that he checks glucose at home but not as often as he should; states that his glucose was in the 140's mg/dl about 2 days prior to coming to the hospital which was the last time he checked his glucose. Patient reports that he was prescribed insulin when  he was initially dx with DM2 but he never started the insulin since his PCP changed him to oral DM medications. Patient confirms that he took Prednisone taper (which was called in as he requested on 07/07/19). Patient states his last A1C was 7% on 06/03/19 (not able to see in office note on 06/03/19 but noted that A1C was 8.1% on 02/24/19). Discussed current A1C of 12.6% on 07/17/19 which indicates an average glucose of 315 mg/dl. Discussed impact of COVID and steroids on glycemic control which has likely impacted A1C results. Discussed target glucose and A1C goals.  Patient reports that he would prefer to use oral DM medications for DM management but he would be willing to take insulin short term if needed but does not really want to take insulin. Informed patient that I would communicate with Dr. Manuella Ghazi to determine discharge plan for DM control and call him back to discuss insulin if needed. Patient verbalized understanding of information discussed and he states that he has no questions at this time related to DM.  Thanks, Barnie Alderman, RN, MSN, CDE Diabetes Coordinator Inpatient Diabetes Program [phone] (Team Pager from 8am to 5pm)

## 2019-07-21 ENCOUNTER — Telehealth: Payer: Self-pay | Admitting: Gastroenterology

## 2019-07-21 DIAGNOSIS — R7989 Other specified abnormal findings of blood chemistry: Secondary | ICD-10-CM

## 2019-07-21 DIAGNOSIS — K8500 Idiopathic acute pancreatitis without necrosis or infection: Secondary | ICD-10-CM

## 2019-07-21 LAB — COMPREHENSIVE METABOLIC PANEL
ALT: 54 U/L — ABNORMAL HIGH (ref 0–44)
AST: 55 U/L — ABNORMAL HIGH (ref 15–41)
Albumin: 1.7 g/dL — ABNORMAL LOW (ref 3.5–5.0)
Alkaline Phosphatase: 142 U/L — ABNORMAL HIGH (ref 38–126)
Anion gap: 8 (ref 5–15)
BUN: 7 mg/dL — ABNORMAL LOW (ref 8–23)
CO2: 21 mmol/L — ABNORMAL LOW (ref 22–32)
Calcium: 7.9 mg/dL — ABNORMAL LOW (ref 8.9–10.3)
Chloride: 105 mmol/L (ref 98–111)
Creatinine, Ser: 1.34 mg/dL — ABNORMAL HIGH (ref 0.61–1.24)
GFR calc Af Amer: >60 mL/min (ref >60)
GFR calc non Af Amer: 56 mL/min — ABNORMAL LOW (ref >60)
Glucose, Bld: 54 mg/dL — ABNORMAL LOW (ref 70–99)
Potassium: 3.9 mmol/L (ref 3.5–5.1)
Sodium: 134 mmol/L — ABNORMAL LOW (ref 135–145)
Total Bilirubin: 0.4 mg/dL (ref 0.3–1.2)
Total Protein: 5 g/dL — ABNORMAL LOW (ref 6.5–8.1)

## 2019-07-21 LAB — GLUCOSE, CAPILLARY
Glucose-Capillary: 48 mg/dL — ABNORMAL LOW (ref 70–99)
Glucose-Capillary: 65 mg/dL — ABNORMAL LOW (ref 70–99)
Glucose-Capillary: 104 mg/dL — ABNORMAL HIGH (ref 70–99)
Glucose-Capillary: 88 mg/dL (ref 70–99)
Glucose-Capillary: 80 mg/dL (ref 70–99)
Glucose-Capillary: 121 mg/dL — ABNORMAL HIGH (ref 70–99)

## 2019-07-21 LAB — CBC
HCT: 23.6 % — ABNORMAL LOW (ref 39.0–52.0)
Hemoglobin: 8 g/dL — ABNORMAL LOW (ref 13.0–17.0)
MCH: 28.2 pg (ref 26.0–34.0)
MCHC: 33.9 g/dL (ref 30.0–36.0)
MCV: 83.1 fL (ref 80.0–100.0)
Platelets: 249 10*3/uL (ref 150–400)
RBC: 2.84 MIL/uL — ABNORMAL LOW (ref 4.22–5.81)
RDW: 13.4 % (ref 11.5–15.5)
WBC: 12.5 10*3/uL — ABNORMAL HIGH (ref 4.0–10.5)
nRBC: 0 % (ref 0.0–0.2)

## 2019-07-21 LAB — MAGNESIUM: Magnesium: 1.6 mg/dL — ABNORMAL LOW (ref 1.7–2.4)

## 2019-07-21 MED ORDER — INSULIN DETEMIR 100 UNIT/ML ~~LOC~~ SOLN
6.0000 [IU] | Freq: Two times a day (BID) | SUBCUTANEOUS | Status: DC
  Administered 2019-07-21 – 2019-07-22 (×3): 6 [IU] via SUBCUTANEOUS
  Filled 2019-07-21 (×5): qty 0.06

## 2019-07-21 MED ORDER — MAGNESIUM SULFATE 2 GM/50ML IV SOLN
2.0000 g | Freq: Once | INTRAVENOUS | Status: AC
  Administered 2019-07-21: 2 g via INTRAVENOUS
  Filled 2019-07-21: qty 50

## 2019-07-21 MED ORDER — AMOXICILLIN-POT CLAVULANATE 875-125 MG PO TABS
1.0000 | ORAL_TABLET | Freq: Two times a day (BID) | ORAL | Status: DC
  Administered 2019-07-21 – 2019-07-22 (×2): 1 via ORAL
  Filled 2019-07-21 (×2): qty 1

## 2019-07-21 MED ORDER — SORBITOL 70 % SOLN
960.0000 mL | TOPICAL_OIL | Freq: Once | ORAL | Status: AC
  Administered 2019-07-21: 960 mL via RECTAL
  Filled 2019-07-21: qty 473

## 2019-07-21 NOTE — Progress Notes (Signed)
    Subjective: Overall feeling much improved. Abdominal pain is about a 1/10. Had two sausage patties , oatmeal, and eggs this morning. Tolerates this well. No nausea, vomiting, or worsening of abdominal pain.  Had BM this morning. Stools are loose. No blood in the stool. No black stool.   Objective: Vital signs in last 24 hours: Temp:  [98 F (36.7 C)-99.1 F (37.3 C)] 98 F (36.7 C) (02/16 1610) Pulse Rate:  [72-89] 72 (02/16 0632) Resp:  [19-20] 20 (02/16 9604) BP: (120-135)/(71-75) 120/71 (02/16 5409) SpO2:  [98 %-100 %] 99 % (02/16 0757) Last BM Date: 07/18/19 General:   Alert and oriented, pleasant Head:  Normocephalic and atraumatic. Eyes:  No icterus, sclera clear. Conjuctiva pink.  Abdomen:  Bowel sounds present, soft, non-tender, non-distended. No rebound or guarding. No masses appreciated.  Extremities:  Without edema. Neurologic:  Alert and  oriented x4;  grossly normal neurologically. Skin:  Warm and dry, intact without significant lesions.  Psych:  Normal mood and affect.  Intake/Output from previous day: 02/15 0701 - 02/16 0700 In: 3079.6 [P.O.:960; I.V.:1980.9; IV Piggyback:138.8] Out: 3750 [Urine:3750] Intake/Output this shift: No intake/output data recorded.  Lab Results: Recent Labs    07/19/19 0634 07/20/19 0715 07/21/19 0528  WBC 13.4* 12.6* 12.5*  HGB 7.9* 7.7* 8.0*  HCT 24.0* 23.2* 23.6*  PLT 230 212 249   BMET Recent Labs    07/19/19 0634 07/20/19 0715 07/21/19 0528  NA 130* 130* 134*  K 3.6 3.1* 3.9  CL 102 102 105  CO2 19* 20* 21*  GLUCOSE 115* 62* 54*  BUN 14 9 7*  CREATININE 1.59* 1.55* 1.34*  CALCIUM 7.6* 7.6* 7.9*   LFT Recent Labs    07/20/19 0715 07/21/19 0528  PROT 5.2* 5.0*  ALBUMIN 1.7* 1.7*  AST 40 55*  ALT 37 54*  ALKPHOS 132* 142*  BILITOT 0.7 0.4   Studies/Results: No results found.  Assessment: 64 year old male admitted with idiopathic pancreatitis, developing fever and leukocytosis over the weekend but  now trending down with continued improvement in leukocytosis s/p empiric treatment with Zosyn starting on 2/13.  T-max 99.1 last 24 hours. Overall feeling much improved with minimal abdominal pain and tolerating diet well. Renal function improving.  LFTs bumped slightly over the last 24 hours.  AST 55, ALT 54, alk phos 142, total bilirubin normal. As patient is asymptomatic at this time, we will plan to follow this with repeat CMP in the a.m.  This could be secondary to underlying fatty liver, acute illness, or possible med effect with Zosyn.  Plan: Continue dysphagia 3 diet. Continue Protonix 40 mg twice daily. Continue to monitor LFTs with repeat CMP in the a.m. Monitor for any worsening of abdominal pain.  Would need repeat imaging. Will need dedicated outpatient repeat imaging of pancreas (CT with pancreatic protocol or MRI) in 6-8 weeks.  Continue supportive measures.   Anticipate discharge in the next 24 hours.   LOS: 5 days    07/21/2019, 9:55 AM   Aliene Altes, PA-C [facility]

## 2019-07-21 NOTE — Telephone Encounter (Signed)
Patient needs MRCP/MRI pancreas for idiopathic pancreatitis in 6-8 weeks.  He will need creatinine two days before.   Make office follow up after MRCP/MRI.

## 2019-07-21 NOTE — Progress Notes (Signed)
PROGRESS NOTE    Philip Richardson  PXT:[id number] DOB: 1956/01/25 DOA: 07/15/2019 PCP: Chevis Pretty, FNP   Brief Narrative:  Per HPI: Philip Romingeris a 64 y.o.malewith medical history significant forhypertension, type 2 diabetes mellitus, hyperlipidemia, GERD andHorse shoe kidneywho presents to the emergency department due to upper abdominal pain with radiation to the back and lower chest area causing lower chest pain which started on Tuesday (2/9), abdominal pain was described as "gas-like"sensation and was rated as 8/10 on pain scale and was associated with decreased appetite. Patient states that he has not had any bowel movement in 1 week. Heis diabetic and he said that he has not checked his blood glucose level in 2 weeks. He denies fever, chills, headache, diaphoresis. Of note, patient states that he was recently treated for sinus infection which responds to antibiotics.  2/11:Patient was admitted with hyperosmolar hyperglycemic state in the setting of acute pancreatitis. He also has recent history of COVID-19 diagnosis on 1/29 at an urgent care facility. He is not hypoxemic, nor does he have any upper respiratory symptoms. He has been started on IV insulin infusion as well as IV fluids and continues to have abdominal pain complaints. GI consultation appreciated for the pancreatitis as there is questionable common bile duct stone present on imaging. He denies alcohol use, but could have some of his symptoms related to Covid. Continue n.p.o. status with close monitoring.  2/12:Patient has improved blood glucose readings and less abdominal pain noted this morning. Awaiting GI recommendations for diet advancement and then will plan to stop insulin drip and convert to sliding scale.  2/13: Lipase levels of further down trended and blood glucose remained stable. He is otherwise asymptomatic and has still not had a bowel movement. Overnight he had developed a  temperature of 101.4 Fahrenheit and was noted to be tachycardic. He was empirically started on IV Zosyn with blood cultures obtained and no growth noted thus far. Will obtain urine analysis and chest x-ray as well. Advance diet per GI. Maintain on IV fluid for now.  2/14: Leukocytosis has improved, but patient still has low-grade temperatures as well as some tachycardia. No growth on cultures or findings that are significant on urinalysis or chest x-ray. Continue Zosyn for now. He is back to full liquid diet as he had some persistent pain with eating a regular diet. Appreciate further GI recommendations.Recheck labs along with lipase in a.m.  2/15: Leukocytosis continues to improve and patient has improvements in his fever noted.  He continues to remain on Zosyn and has been switched to dysphagia 2 diet.  Plan to continue current treatments per GI recommendations and very slowly advance.  Supplement potassium and continue IV fluid.  Consider repeat CT imaging as needed for worsening clinical symptoms.  2/16: Patient states that he has continued epigastric pain, but this is slowly improving and he is able to tolerate his current diet.  Unfortunately his transaminitis is still upward trending and GI would like to follow prior to discharge.  Continue IV Zosyn which he is currently on day 4. Uncertain what is being treated at this point, but would recommend finishing up a 7-day course of treatment potentially with oral Augmentin on discharge.  Likely discharge by a.m. if stable or improving liver enzyme trend noted.  Assessment & Plan:   Principal Problem:   Hyperosmolar hyperglycemic state (HHS) ([place]) Active Problems:   Hypertension   Dyslipidemia   Pancreatitis   Abdominal pain   GERD (gastroesophageal reflux disease)   Hyponatremia  Hypomagnesemia   Hypoalbuminemia   Hyperkalemia   Leukocytosis   Hyperglycemia due to diabetes mellitus (HCC)   AKI (acute kidney injury) ([place])    Constipation   Elevated lipase   Elevated LFTs   Acute pancreatitis-idiopathic -Appears to be improving clinically -Appreciate GI recommendationswith monitoring of transaminitis -Continue on dysphagia 2 diet as ordered -Maintain on IV fluidas orderedfor now -Patient will likely require outpatient imaging studies as recommended in the near future after discharge  Hyperosmolar hyperglycemic state secondary to above-resolved -Continue current management -We will not be started on insulin as it appears that he had hyperglycemia related to recent steroid use -Hemoglobin A1c 12.6% which will need close follow-up, no need for insulin on discharge given recent steroid use  Fever with tachycardia-currently resolved -Noted SIRS criteria without any definitive source of infectionon chest x-ray, urine analysis, or blood cultures -Started on IV Zosyn empirically and he is currently day 4.  Would recommend finishing a 7-day course of antibiotic treatments potentially with Augmentin on day of discharge. -Procalcitoninremains elevated, reevaluate in a.m. for de-escalation of antibiotics -Leukocytosis is improving and CBC will be reevaluated in a.m. -Continue to monitor closely -Denies any significant symptomatology, but may consider repeat abdominal CT if this persists  Hypomagnesemia -Replete and reevaluate in a.m.  Recent COVID-19 diagnosis -Patient appears asymptomatic from this currently does not require isolation -Diagnosed in urgent care facility 1/29  Essential hypertension-controlled -Continue home amlodipine -Continue to hold lisinopril/HCTZ  Dyslipidemia -Holding statin for now -Would likely hold on discharge given transaminitis  GERD -Continue PPI, currently twice daily  Mild hyponatremia-improving -Likely secondary to HCTZ use at home,holding for now -Continue onnormal salineat current rate  AKI on CKD stage IIIb -Steadily improving -Continue to monitor  repeat labs with IV fluid ongoing  Constipation-improved -Patient noted to have a bowel movement on 2/13 -Bowel movement noted after enema today   DVT prophylaxis:Heparin Code Status:Full Family Communication:Girlfriend at bedside Disposition Plan:Continue to monitor liver enzymes tomorrow.  If stable or downtrending can likely discharge.   Consultants:  GI  Procedures:  See studies below  Antimicrobials:  Anti-infectives (From admission, onward)   Start     Dose/Rate Route Frequency Ordered Stop   07/18/19 0630  piperacillin-tazobactam (ZOSYN) IVPB 3.375 g     3.375 g 12.5 mL/hr over 240 Minutes Intravenous Every 8 hours 07/18/19 0557         Subjective: Patient seen and evaluated today with no new acute complaints or concerns. No acute concerns or events noted overnight.  He complains of some mild indigestion.  He did have a bowel movement with enema today.  Objective: Vitals:   07/20/19 2200 07/21/19 0632 07/21/19 0757 07/21/19 1155  BP: 130/71 120/71    Pulse: 89 72  (!) 110  Resp: 20 20    Temp: 99.1 F (37.3 C) 98 F (36.7 C)    TempSrc: Oral     SpO2: 98% 100% 99% 95%  Weight:      Height:        Intake/Output Summary (Last 24 hours) at 07/21/2019 1526 Last data filed at 07/21/2019 1100 Gross per 24 hour  Intake 2719.64 ml  Output 2150 ml  Net 569.64 ml   Filed Weights   07/16/19 0635 07/17/19 0500 07/20/19 0432  Weight: 77.5 kg 79.6 kg 83.9 kg    Examination:  General exam: Appears calm and comfortable  Respiratory system: Clear to auscultation. Respiratory effort normal. Cardiovascular system: S1 & S2 heard, RRR. No JVD, murmurs, rubs,  gallops or clicks. No pedal edema. Gastrointestinal system: Abdomen is nondistended, soft and nontender. No organomegaly or masses felt. Normal bowel sounds heard. Central nervous system: Alert and oriented. No focal neurological deficits. Extremities: Symmetric 5 x 5 power. Skin: No rashes,  lesions or ulcers Psychiatry: Judgement and insight appear normal. Mood & affect appropriate.     Data Reviewed: I have personally reviewed following labs and imaging studies  CBC: Recent Labs  Lab 07/16/19 0342 07/18/19 0704 07/19/19 0634 07/20/19 0715 07/21/19 0528  WBC 14.5* 21.7* 13.4* 12.6* 12.5*  HGB 10.8* 9.1* 7.9* 7.7* 8.0*  HCT 31.9* 27.7* 24.0* 23.2* 23.6*  MCV 83.3 83.7 84.2 83.5 83.1  PLT 226 257 230 212 630   Basic Metabolic Panel: Recent Labs  Lab 07/16/19 0342 07/16/19 0845 07/17/19 0341 07/18/19 0704 07/19/19 0634 07/20/19 0715 07/21/19 0528  NA 129*   < > 133* 133* 130* 130* 134*  K 5.2*   < > 4.9 4.0 3.6 3.1* 3.9  CL 98   < > 104 104 102 102 105  CO2 22   < > 21* 20* 19* 20* 21*  GLUCOSE 229*   < > 151* 119* 115* 62* 54*  BUN 39*   < > 24* 16 14 9  7*  CREATININE 2.38*   < > 1.72* 1.67* 1.59* 1.55* 1.34*  CALCIUM 8.6*   < > 7.9* 7.9* 7.6* 7.6* 7.9*  MG 1.6*  --   --   --   --   --  1.6*  PHOS 3.3  --   --   --   --   --   --    < > = values in this interval not displayed.   GFR: Estimated Creatinine Clearance: 63.8 mL/min (A) (by C-G formula based on SCr of 1.34 mg/dL (H)). Liver Function Tests: Recent Labs  Lab 07/16/19 2108 07/17/19 0038 07/17/19 0341 07/20/19 0715 07/21/19 0528  AST 21 20 22  40 55*  ALT 19 20 19  37 54*  ALKPHOS 68 70 64 132* 142*  BILITOT 0.7 0.8 0.4 0.7 0.4  PROT 5.6* 5.7* 5.9* 5.2* 5.0*  ALBUMIN 2.3* 2.4* 2.4* 1.7* 1.7*   Recent Labs  Lab 07/15/19 1829 07/17/19 0341 07/18/19 0704 07/20/19 0715  LIPASE 1,014* 310* 63* 25   No results for input(s): AMMONIA in the last 168 hours. Coagulation Profile: No results for input(s): INR, PROTIME in the last 168 hours. Cardiac Enzymes: No results for input(s): CKTOTAL, CKMB, CKMBINDEX, TROPONINI in the last 168 hours. BNP (last 3 results) No results for input(s): PROBNP in the last 8760 hours. HbA1C: No results for input(s): HGBA1C in the last 72  hours. CBG: Recent Labs  Lab 07/20/19 2136 07/21/19 0744 07/21/19 0814 07/21/19 0949 07/21/19 1150  GLUCAP 111* 48* 65* 104* 88   Lipid Profile: No results for input(s): CHOL, HDL, LDLCALC, TRIG, CHOLHDL, LDLDIRECT in the last 72 hours. Thyroid Function Tests: No results for input(s): TSH, T4TOTAL, FREET4, T3FREE, THYROIDAB in the last 72 hours. Anemia Panel: No results for input(s): VITAMINB12, FOLATE, FERRITIN, TIBC, IRON, RETICCTPCT in the last 72 hours. Sepsis Labs: Recent Labs  Lab 07/16/19 0845 07/18/19 0709 07/18/19 1006 07/19/19 0634 07/20/19 0715  PROCALCITON 0.44 1.27  --  1.26 1.11  LATICACIDVEN  --  1.4 1.4  --  0.6    Recent Results (from the past 240 hour(s))  MRSA PCR Screening     Status: None   Collection Time: 07/16/19  6:22 AM   Specimen: Nasal Mucosa;  Nasopharyngeal  Result Value Ref Range Status   MRSA by PCR NEGATIVE NEGATIVE Final    Comment:        The GeneXpert MRSA Assay (FDA approved for NASAL specimens only), is one component of a comprehensive MRSA colonization surveillance program. It is not intended to diagnose MRSA infection nor to guide or monitor treatment for MRSA infections. Performed at [facility], [address], [place], [place] [address]   Culture, blood (routine x 2)     Status: None (Preliminary result)   Collection Time: 07/18/19  7:08 AM   Specimen: BLOOD RIGHT ARM  Result Value Ref Range Status   Specimen Description   Final    BLOOD RIGHT ARM BOTTLES DRAWN AEROBIC AND ANAEROBIC   Special Requests Blood Culture adequate volume  Final   Culture   Final    NO GROWTH 3 DAYS Performed at [facility], [address] [place], [place] [address]    Report Status PENDING  Incomplete  Culture, blood (routine x 2)     Status: None (Preliminary result)   Collection Time: 07/18/19  7:16 AM   Specimen: Right Antecubital; Blood  Result Value Ref Range Status   Specimen Description   Final    RIGHT ANTECUBITAL BOTTLES  DRAWN AEROBIC AND ANAEROBIC   Special Requests Blood Culture adequate volume  Final   Culture   Final    NO GROWTH 3 DAYS Performed at [facility], [address], [place], [place] [address]    Report Status PENDING  Incomplete         Radiology Studies: No results found.      Scheduled Meds: . amLODipine  10 mg Oral Daily  . feeding supplement (GLUCERNA SHAKE)  237 mL Oral TID BM  . heparin  5,000 Units Subcutaneous Q8H  .  HYDROmorphone (DILAUDID) injection  0.5 mg Intravenous Q6H  . insulin aspart  0-20 Units Subcutaneous TID WC  . insulin aspart  0-5 Units Subcutaneous QHS  . insulin detemir  6 Units Subcutaneous BID  . pantoprazole  40 mg Oral BID AC  . polyethylene glycol  17 g Oral Daily  . potassium chloride  40 mEq Oral BID   Continuous Infusions: . sodium chloride 75 mL/hr at 07/21/19 0528  . piperacillin-tazobactam (ZOSYN)  IV 3.375 g (07/21/19 1425)     LOS: 5 days    Time spent: 30 minutes    Mishel Sans Darleen Crocker, DO [facility] Pager [phone]  If 7PM-7AM, please contact night-coverage [email or web address] Password [facility] 07/21/2019, 3:26 PM

## 2019-07-21 NOTE — Progress Notes (Signed)
Pharmacy Antibiotic Note  Philip Richardson is a 64 y.o. male admitted on 07/15/2019 with sepsis.  Pharmacy has been consulted for Zosyn dosing. Patient improving, afebrile. Starting to advance diet.  Plan:  Continue Zosyn 3.375gm IV q8h (EI) Pharmacy will continue to monitor renal function,  cultures and patient progress. F/U transition to po abx   Height: 6\' 1"  (185.4 cm) Weight: 184 lb 15.5 oz (83.9 kg) IBW/kg (Calculated) : 79.9  Temp (24hrs), Avg:98.3 F (36.8 C), Min:98 F (36.7 C), Max:99.1 F (37.3 C)  Recent Labs  Lab 07/16/19 0342 07/16/19 0845 07/17/19 0341 07/18/19 0704 07/18/19 0709 07/18/19 1006 07/19/19 0634 07/20/19 0715 07/21/19 0528  WBC 14.5*  --   --  21.7*  --   --  13.4* 12.6* 12.5*  CREATININE 2.38*   < > 1.72* 1.67*  --   --  1.59* 1.55* 1.34*  LATICACIDVEN  --   --   --   --  1.4 1.4  --  0.6  --    < > = values in this interval not displayed.    Estimated Creatinine Clearance: 63.8 mL/min (A) (by C-G formula based on SCr of 1.34 mg/dL (H)).    Allergies  Allergen Reactions  . Invokana [Canagliflozin] Other (See Comments)    weakness    Antimicrobials this admission: Zosyn 2/13>      Microbiology results: 2/13 BC x2:  ngtd 2/11 MRSA PCR: negative COVID- (+) 1/29   Thank you for allowing pharmacy to be a part of this patient's care.  Isac Sarna, BS [place], [place] Clinical Pharmacist Pager [phone] 07/21/2019 12:56 PM

## 2019-07-22 ENCOUNTER — Encounter: Payer: Self-pay | Admitting: Gastroenterology

## 2019-07-22 ENCOUNTER — Other Ambulatory Visit: Payer: Self-pay

## 2019-07-22 DIAGNOSIS — K8500 Idiopathic acute pancreatitis without necrosis or infection: Secondary | ICD-10-CM

## 2019-07-22 DIAGNOSIS — Z79899 Other long term (current) drug therapy: Secondary | ICD-10-CM

## 2019-07-22 DIAGNOSIS — E1121 Type 2 diabetes mellitus with diabetic nephropathy: Secondary | ICD-10-CM

## 2019-07-22 DIAGNOSIS — N1832 Chronic kidney disease, stage 3b: Secondary | ICD-10-CM

## 2019-07-22 LAB — COMPREHENSIVE METABOLIC PANEL
ALT: 49 U/L — ABNORMAL HIGH (ref 0–44)
AST: 39 U/L (ref 15–41)
Albumin: 1.8 g/dL — ABNORMAL LOW (ref 3.5–5.0)
Alkaline Phosphatase: 153 U/L — ABNORMAL HIGH (ref 38–126)
Anion gap: 7 (ref 5–15)
BUN: <5 mg/dL — ABNORMAL LOW (ref 8–23)
CO2: 21 mmol/L — ABNORMAL LOW (ref 22–32)
Calcium: 7.7 mg/dL — ABNORMAL LOW (ref 8.9–10.3)
Chloride: 104 mmol/L (ref 98–111)
Creatinine, Ser: 1.3 mg/dL — ABNORMAL HIGH (ref 0.61–1.24)
GFR calc Af Amer: >60 mL/min (ref >60)
GFR calc non Af Amer: 58 mL/min — ABNORMAL LOW (ref >60)
Glucose, Bld: 173 mg/dL — ABNORMAL HIGH (ref 70–99)
Potassium: 4.3 mmol/L (ref 3.5–5.1)
Sodium: 132 mmol/L — ABNORMAL LOW (ref 135–145)
Total Bilirubin: 0.4 mg/dL (ref 0.3–1.2)
Total Protein: 5.3 g/dL — ABNORMAL LOW (ref 6.5–8.1)

## 2019-07-22 LAB — CBC
HCT: 24.2 % — ABNORMAL LOW (ref 39.0–52.0)
Hemoglobin: 7.9 g/dL — ABNORMAL LOW (ref 13.0–17.0)
MCH: 27.8 pg (ref 26.0–34.0)
MCHC: 32.6 g/dL (ref 30.0–36.0)
MCV: 85.2 fL (ref 80.0–100.0)
Platelets: 237 10*3/uL (ref 150–400)
RBC: 2.84 MIL/uL — ABNORMAL LOW (ref 4.22–5.81)
RDW: 13.9 % (ref 11.5–15.5)
WBC: 9.5 10*3/uL (ref 4.0–10.5)
nRBC: 0 % (ref 0.0–0.2)

## 2019-07-22 LAB — GLUCOSE, CAPILLARY
Glucose-Capillary: 216 mg/dL — ABNORMAL HIGH (ref 70–99)
Glucose-Capillary: 163 mg/dL — ABNORMAL HIGH (ref 70–99)

## 2019-07-22 LAB — PROCALCITONIN: Procalcitonin: 0.59 ng/mL

## 2019-07-22 LAB — MAGNESIUM: Magnesium: 1.6 mg/dL — ABNORMAL LOW (ref 1.7–2.4)

## 2019-07-22 MED ORDER — PANTOPRAZOLE SODIUM 40 MG PO TBEC: 40.0000 mg | DELAYED_RELEASE_TABLET | Freq: Two times a day (BID) | ORAL | 1 refills | Status: DC

## 2019-07-22 MED ORDER — INSULIN DETEMIR 100 UNIT/ML FLEXPEN: 12.0000 [IU] | Freq: Every day | SUBCUTANEOUS | 2 refills | Status: DC

## 2019-07-22 MED ORDER — AMOXICILLIN-POT CLAVULANATE 875-125 MG PO TABS: 1.0000 | ORAL_TABLET | Freq: Two times a day (BID) | ORAL | 0 refills | Status: AC

## 2019-07-22 MED ORDER — PEN NEEDLES 31G X 8 MM MISC: 1 refills | Status: DC

## 2019-07-22 NOTE — Discharge Summary (Signed)
Physician Discharge Summary  Philip Richardson WEX:[id number] DOB: [birth year] DOA: 07/15/2019  PCP: Chevis Pretty, FNP  Admit date: 07/15/2019 Discharge date: 07/22/2019  Time spent: 35 minutes  Recommendations for Outpatient Follow-up:  1. Repeat BMET and Mg level to follow electrolytes and renal function 2. -close monitoring of patient CBG's with further adjustment to hypoglycemic regimen as needed 3. Reassess BP and adjust antihypertensive meds.  4. Repeat LFT's to follow liver function trend.   Discharge Diagnoses:  Principal Problem:   Hyperosmolar hyperglycemic state (HHS) ([place]) Active Problems:   Hypertension   Dyslipidemia   Pancreatitis   Abdominal pain   GERD (gastroesophageal reflux disease)   Hyponatremia   Hypomagnesemia   Hypoalbuminemia   Hyperkalemia   Leukocytosis   Hyperglycemia due to diabetes mellitus (HCC)   AKI (acute kidney injury) ([place])   Constipation   Elevated lipase   Elevated LFTs   Discharge Condition: stable and improved. Discharge home with instructions to follow up with PCP in 10 days and with GI service as an outpatient.  Diet recommendation: heart healthy/low fat and modified carbohydrates.  Filed Weights   07/16/19 0635 07/17/19 0500 07/20/19 0432  Weight: 77.5 kg 79.6 kg 83.9 kg    History of present illness:  As per H&P written by Dr. Josephine Cables on 07/16/19 64 y.o. male with medical history significant for hypertension, type 2 diabetes mellitus, hyperlipidemia, GERD and Horse shoe kidney who presents to the emergency department due to upper abdominal pain with radiation to the back and lower chest area causing lower chest pain which started on Tuesday (2/9), abdominal pain was described as  "gas-like "sensation and was rated as 8/10 on pain scale and was associated with decreased appetite.  Patient states that he has not had any bowel movement in 1 week. He is diabetic and he said that he has not checked his blood glucose level in 2  weeks.  He denies fever, chills, headache, diaphoresis.  Of note, patient states that he was recently treated for sinus infection which responds to antibiotics.  ED Course:  In the emergency department, temperature was 97.3 F, BP 104/66 with other vital signs being stable. He was noted to be hyperglycemic with blood glucose level at 804.  WBC 12.9, patient was also noted to be hyponatremic, hyperkalemic, hypoalbuminemic and elevated BUN/creatinine of 44/2.86 (baseline creatinine was 1.7-1.8).  Lipase 1014.  Urinalysis was positive for glucosuria.  CT abdomen and pelvis without contrast showed patchy/streaky airspace opacity at both lung bases which could be due to atelectasis or infectious etiology.  Findings consistent with diffuse extensive acute pancreatitis noted.  Normal x-ray showed nonobstructive bowel gas pattern with moderate stool burden and bilateral renal calculi.  Chest x-ray showed new mild bibasilar opacities which could reflect early infection or atelectasis.  10 units subcutaneous insulin followed by IV insulin drip was started.  IV Protonix and IV hydration provided.  Hospitalist was asked to admit patient for further evaluation and management.  Hospital Course:  1-acute pancreatitis: idiopathic  -clinically improved and tolerating diet without problems at discharge -advised to follow low fat and low carb diet -close outpatient follow up with GI service -cimetidine discontinued -instructed to maintained adequate hydration  2-poorly controlled type 2 diabetes/hyperosmolar non-ketotic hyperglycemia -A1C 12.6 -CBG's worsen after recent steroids as an outpatient -will discharge on metformin, glimepiride and levemir -close follow up with PCP to further adjust hypoglycemic regimen   3-SIRS: in the setting of HONK and concerns for aspiration pneumonitis -initially treated with zosyn and  subsequent transition to augmentin -excellent response and with resolved SIRS features at  discharge -advised to maintain adequate hydration  4-hypomagnesemia -repleted -repeat electrolytes check at follow up visit  5-essential HTN -overall stable -will continue current antihypertensive regimen   6-AKI on CKD stage 3 b at baseline  -improved and at baseline at discharge -repeat BMET at follow up visit to reassess renal function  -safe to resume ACE/HCTZ -Cr 1.3 at discharge  7-GERD -continue PPI  Procedures:  See below for x-ray reports   Consultations:  GI  Discharge Exam: Vitals:   07/22/19 0537 07/22/19 1306  BP: (!) 141/64 140/70  Pulse: 90 88  Resp: 16 16  Temp: 98.2 F (36.8 C) 98.6 F (37 C)  SpO2: 100% 100%    General: afebrile, no CP, no nausea, no vomiting; wants to go home. CBG's well controlled. Cardiovascular: S1 and S2, no rubs, no gallops.  Respiratory: CTA bilaterally, normal resp effort.  Abd: soft, NT, ND, positive BS Extremities: no cyanosis, no edema  Discharge Instructions   Discharge Instructions    Diet - low sodium heart healthy   Complete by: As directed    Discharge instructions   Complete by: As directed    Follow modified carb diet Maintain adequate hydration Follow up with GI as instructed Use insulin as prescribed and follow up with PCP to further adjust hypoglycemic regimen. Complete antibiotic therapy as instructed.   Increase activity slowly   Complete by: As directed      Allergies as of 07/22/2019      Reactions   Invokana [canagliflozin] Other (See Comments)   weakness      Medication List    STOP taking these medications   atorvastatin 40 MG tablet Commonly known as: Lipitor   azithromycin 250 MG tablet Commonly known as: Zithromax Z-Pak   CHOLEST OFF COMPLETE PO   cimetidine 200 MG tablet Commonly known as: TAGAMET   predniSONE 10 MG (21) Tbpk tablet Commonly known as: STERAPRED UNI-PAK 21 TAB     TAKE these medications   albuterol 108 (90 Base) MCG/ACT inhaler Commonly known as:  VENTOLIN HFA Inhale 2 puffs into the lungs every 6 (six) hours as needed for wheezing or shortness of breath.   amLODipine 10 MG tablet Commonly known as: NORVASC Take 1 tablet (10 mg total) by mouth daily.   amoxicillin-clavulanate 875-125 MG tablet Commonly known as: AUGMENTIN Take 1 tablet by mouth every 12 (twelve) hours for 4 days.   benzonatate 100 MG capsule Commonly known as: Tessalon Perles Take 1 capsule (100 mg total) by mouth 3 (three) times daily as needed for cough.   glimepiride 4 MG tablet Commonly known as: AMARYL Take 1 tablet (4 mg total) by mouth daily before breakfast.   insulin detemir 100 unit/ml Soln Commonly known as: LEVEMIR Inject 0.12 mLs (12 Units total) into the skin daily.   lisinopril-hydrochlorothiazide 20-12.5 MG tablet Commonly known as: Zestoretic Take 2 tablets by mouth daily. What changed:   how much to take  when to take this   metFORMIN 1000 MG tablet Commonly known as: GLUCOPHAGE Take 1 tablet (1,000 mg total) by mouth 2 (two) times daily with a meal.   multivitamin with minerals Tabs tablet Take 1 tablet by mouth daily.   pantoprazole 40 MG tablet Commonly known as: PROTONIX Take 1 tablet (40 mg total) by mouth 2 (two) times daily before a meal.   Pen Needles 31G X 8 MM Misc Use to inject insulin daily as  prescribed   Virtussin A/C 100-10 MG/5ML syrup Generic drug: guaiFENesin-codeine Take 5 mLs by mouth 3 (three) times daily as needed for cough.      Allergies  Allergen Reactions  . Invokana [Canagliflozin] Other (See Comments)    weakness    The results of significant diagnostics from this hospitalization (including imaging, microbiology, ancillary and laboratory) are listed below for reference.    Significant Diagnostic Studies: CT ABDOMEN PELVIS WO CONTRAST  Addendum Date: 07/16/2019   ADDENDUM REPORT: 07/16/2019 11:41 ADDENDUM: Upon review of the images and the report, correction is made to the body of the  report. In the section discussing the pancreas, the third sentence should be corrected to state: "NO common bile duct stones are seen." This than matches the impression of the report, which indicates an absence of common bile duct stones. Remainder of report as previously dictated by original interpreting radiologist. Electronically Signed   By: Lavonia Dana M.D.   On: 07/16/2019 11:41   Result Date: 07/16/2019 CLINICAL DATA:  Abdominal pain EXAM: CT ABDOMEN AND PELVIS without CONTRAST TECHNIQUE: Multidetector CT imaging of the abdomen and pelvis was performed following the standard protocol without IV contrast. COMPARISON:  None. FINDINGS: Lower chest: The visualized heart size within normal limits. No pericardial fluid/thickening. There is a small hiatal hernia present. Contrast is seen refluxing within the distal esophagus. Increased interstitial markings and patchy airspace opacity seen at both lung bases. Hepatobiliary: The liver is normal in density without focal abnormality.The main portal vein is patent. No evidence of calcified gallstones, gallbladder wall thickening or biliary dilatation. Pancreas: There is diffuse mesenteric fat stranding changes seen surrounding the entirety of the pancreas, predominantly around the pancreatic head and proximal body. No definite pancreatic ductal dilatation is seen. Common bile duct stones are seen. No loculated fluid collections is seen. Scattered small lymph nodes are noted. Parenchymal enhancement evaluation is limited due to lack of intravenous contrast. Spleen: Normal in size without focal abnormality. Adrenals/Urinary Tract: Both adrenal glands appear normal. 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 Multiple bilateral renal calculi are noted. The largest measuring 1.2 cm in the upper pole the right kidney. There is mild bilateral renal atrophy. There is a 1 cm low-density lesion seen off the upper pole of the left kidney. No hydronephrosis. The  bladder is unremarkable. Stomach/Bowel: There is a small hiatal hernia. There is diffuse fat stranding changes and wall thickening seen around the second and third portion of the duodenum. The remainder of the small bowel is unremarkable.Scattered colonic diverticula are noted without diverticulitis. The appendix is normal in appearance. Vascular/Lymphatic: There are no enlarged mesenteric, retroperitoneal, or pelvic lymph nodes. Scattered aortic atherosclerotic calcifications are seen without aneurysmal dilatation. Reproductive: The prostate is unremarkable. Other: No evidence of abdominal wall mass or hernia. Musculoskeletal: No acute or significant osseous findings. IMPRESSION: 1. Patchy/streaky airspace opacity at both lung bases which could be due to atelectasis or infectious etiology. 2. Findings consistent with diffuse extensive acute pancreatitis, most notable about the proximal body and pancreatic head. No definite loculated fluid collections or common bile duct stones. 3. Adjacent inflammatory changes involving the duodenum. 4. Multiple bilateral non-obstructing renal calculi. 5. Diverticulosis without diverticulitis. 6.  Aortic Atherosclerosis (ICD10-I70.0). Electronically Signed: By: Prudencio Pair M.D. On: 07/16/2019 01:32   DG Chest 1 View  Result Date: 07/18/2019 CLINICAL DATA:  COVID-19 positivity with fevers EXAM: CHEST  1 VIEW COMPARISON:  07/15/2019 FINDINGS: Cardiac shadow is stable. Stable bibasilar opacities  are seen. No focal confluent infiltrate is noted. No sizable effusion is seen. No bony abnormality is noted. IMPRESSION: No significant change from the prior exam. Electronically Signed   By: Inez Catalina M.D.   On: 07/18/2019 11:19   DG Chest 2 View  Result Date: 07/15/2019 CLINICAL DATA:  Chest pain. EXAM: CHEST - 2 VIEW COMPARISON:  12/01/2013 FINDINGS: The cardiomediastinal silhouette is within normal limits. There are new mild opacities in both lung bases. The upper lungs are  clear. No pleural effusion or pneumothorax is identified. No acute osseous abnormality is seen. IMPRESSION: New mild bibasilar opacities which could reflect early infection or atelectasis. Electronically Signed   By: Logan Bores M.D.   On: 07/15/2019 18:58   DG Abdomen 1 View  Result Date: 07/15/2019 CLINICAL DATA:  Patient c/o chest and abdominal pain that started yesterday. Reports no BM x 1 week. EXAM: ABDOMEN - 1 VIEW COMPARISON:  Abdominal radiograph 09/21/2014, CT abdomen pelvis 09/09/2013 FINDINGS: There are no dilated loops of bowel to suggest obstruction. No supine evidence for free air. Moderate stool burden. Small radiopaque densities projecting over the bilateral kidneys mostly representing renal stones. Lung bases are excluded from field of view. No acute finding in the visualized skeleton. IMPRESSION: 1. Nonobstructive bowel gas pattern.  Moderate stool burden. 2. Bilateral renal calculi. Electronically Signed   By: Audie Pinto M.D.   On: 07/15/2019 19:20   US Abdomen Limited RUQ  Result Date: 07/16/2019 CLINICAL DATA:  Abdominal pain. EXAM: ULTRASOUND ABDOMEN LIMITED RIGHT UPPER QUADRANT COMPARISON:  CT abdomen pelvis 07/16/2019 FINDINGS: Gallbladder: No gallstones or wall thickening visualized. No sonographic Murphy sign noted by sonographer. Common bile duct: Diameter: 0.2 cm, within normal limits Liver: Poor visualization due to shadowing bowel gas and increased echogenicity. No definite focal lesion identified. The liver parenchymal echogenicity appears diffusely increased. Portal vein is patent on color Doppler imaging with normal direction of blood flow towards the liver. Other: None. IMPRESSION: 1.  No acute finding in the right upper quadrant. 2. Limited visualization of the liver due to shadowing bowel gas and increased echogenicity. Diffusely increased parenchymal echogenicity most commonly seen with hepatic steatosis. Electronically Signed   By: Audie Pinto M.D.   On:  07/16/2019 11:54    Microbiology: Recent Results (from the past 240 hour(s))  MRSA PCR Screening     Status: None   Collection Time: 07/16/19  6:22 AM   Specimen: Nasal Mucosa; Nasopharyngeal  Result Value Ref Range Status   MRSA by PCR NEGATIVE NEGATIVE Final    Comment:        The GeneXpert MRSA Assay (FDA approved for NASAL specimens only), is one component of a comprehensive MRSA colonization surveillance program. It is not intended to diagnose MRSA infection nor to guide or monitor treatment for MRSA infections. Performed at [facility], [address], [place], [place] [address]   Culture, blood (routine x 2)     Status: None (Preliminary result)   Collection Time: 07/18/19  7:08 AM   Specimen: BLOOD RIGHT ARM  Result Value Ref Range Status   Specimen Description   Final    BLOOD RIGHT ARM BOTTLES DRAWN AEROBIC AND ANAEROBIC   Special Requests Blood Culture adequate volume  Final   Culture   Final    NO GROWTH 4 DAYS Performed at [facility], [address], [place], [place] [address]    Report Status PENDING  Incomplete  Culture, blood (routine x 2)     Status:  None (Preliminary result)   Collection Time: 07/18/19  7:16 AM   Specimen: Right Antecubital; Blood  Result Value Ref Range Status   Specimen Description   Final    RIGHT ANTECUBITAL BOTTLES DRAWN AEROBIC AND ANAEROBIC   Special Requests Blood Culture adequate volume  Final   Culture   Final    NO GROWTH 4 DAYS Performed at [facility], [address], [place], [place] [address]    Report Status PENDING  Incomplete     Labs: Basic Metabolic Panel: Recent Labs  Lab 07/16/19 0342 07/16/19 0845 07/18/19 0704 07/19/19 0962 07/20/19 0715 07/21/19 0528 07/22/19 0626  NA 129*   < > 133* 130* 130* 134* 132*  K 5.2*   < > 4.0 3.6 3.1* 3.9 4.3  CL 98   < > 104 102 102 105 104  CO2 22   < > 20* 19* 20* 21* 21*  GLUCOSE 229*   < > 119* 115* 62* 54* 173*  BUN 39*   < > 16 14 9  7* <5*  CREATININE 2.38*    < > 1.67* 1.59* 1.55* 1.34* 1.30*  CALCIUM 8.6*   < > 7.9* 7.6* 7.6* 7.9* 7.7*  MG 1.6*  --   --   --   --  1.6* 1.6*  PHOS 3.3  --   --   --   --   --   --    < > = values in this interval not displayed.   Liver Function Tests: Recent Labs  Lab 07/17/19 0038 07/17/19 0341 07/20/19 0715 07/21/19 0528 07/22/19 0626  AST 20 22 40 55* 39  ALT 20 19 37 54* 49*  ALKPHOS 70 64 132* 142* 153*  BILITOT 0.8 0.4 0.7 0.4 0.4  PROT 5.7* 5.9* 5.2* 5.0* 5.3*  ALBUMIN 2.4* 2.4* 1.7* 1.7* 1.8*   Recent Labs  Lab 07/15/19 1829 07/17/19 0341 07/18/19 0704 07/20/19 0715  LIPASE 1,014* 310* 63* 25   CBC: Recent Labs  Lab 07/18/19 0704 07/19/19 0634 07/20/19 0715 07/21/19 0528 07/22/19 0626  WBC 21.7* 13.4* 12.6* 12.5* 9.5  HGB 9.1* 7.9* 7.7* 8.0* 7.9*  HCT 27.7* 24.0* 23.2* 23.6* 24.2*  MCV 83.7 84.2 83.5 83.1 85.2  PLT 257 230 212 249 237   CBG: Recent Labs  Lab 07/21/19 1150 07/21/19 1643 07/21/19 2204 07/22/19 0741 07/22/19 1139  GLUCAP 88 80 121* 216* 163*    Signed:  Barton Dubois MD.  [facility] 07/22/2019, 2:04 PM

## 2019-07-22 NOTE — Telephone Encounter (Signed)
Attempted to submit PA for MRI via [facility]. PA will need to be submitted closer to appt date.

## 2019-07-22 NOTE — Progress Notes (Signed)
Pt was transported via [name] to the lobby to meet spouse in private vehicle. He shows no signs of distress, and confirmed understanding of discharge instructions.

## 2019-07-22 NOTE — Addendum Note (Signed)
Addended by: Zara Council C on: 07/22/2019 11:40 AM   Modules accepted: Orders

## 2019-07-22 NOTE — Progress Notes (Signed)
Subjective:  Continues to feel better.  Tolerating diet.  No nausea or vomiting.  Abdominal pain improving.  Wants to go home.  Objective: Vital signs in last 24 hours: Temp:  [98.2 F (36.8 C)-98.8 F (37.1 C)] 98.2 F (36.8 C) (02/17 0537) Pulse Rate:  [83-110] 90 (02/17 0537) Resp:  [16] 16 (02/17 0537) BP: (133-141)/(64-67) 141/64 (02/17 0537) SpO2:  [95 %-100 %] 100 % (02/17 0537) Last BM Date: 07/21/19 General:   Alert,  Well-developed, well-nourished, pleasant and cooperative in NAD Head:  Normocephalic and atraumatic. Eyes:  Sclera clear, no icterus.  Abdomen:  Soft, nontender and nondistended. Normal bowel sounds, without guarding, and without rebound.   Extremities:  Without clubbing, deformity or edema. Neurologic:  Alert and  oriented x4;  grossly normal neurologically. Skin:  Intact without significant lesions or rashes. Psych:  Alert and cooperative. Normal mood and affect.  Intake/Output from previous day: 02/16 0701 - 02/17 0700 In: 240 [P.O.:240] Out: 1250 [Urine:1250] Intake/Output this shift: Total I/O In: 120 [P.O.:120] Out: 600 [Urine:600]  Lab Results: CBC Recent Labs    07/20/19 0715 07/21/19 0528 07/22/19 0626  WBC 12.6* 12.5* 9.5  HGB 7.7* 8.0* 7.9*  HCT 23.2* 23.6* 24.2*  MCV 83.5 83.1 85.2  PLT 212 249 237   BMET Recent Labs    07/20/19 0715 07/21/19 0528 07/22/19 0626  NA 130* 134* 132*  K 3.1* 3.9 4.3  CL 102 105 104  CO2 20* 21* 21*  GLUCOSE 62* 54* 173*  BUN 9 7* <5*  CREATININE 1.55* 1.34* 1.30*  CALCIUM 7.6* 7.9* 7.7*   LFTs Recent Labs    07/20/19 0715 07/21/19 0528 07/22/19 0626  BILITOT 0.7 0.4 0.4  ALKPHOS 132* 142* 153*  AST 40 55* 39  ALT 37 54* 49*  PROT 5.2* 5.0* 5.3*  ALBUMIN 1.7* 1.7* 1.8*   Recent Labs    07/20/19 0715  LIPASE 25   PT/INR No results for input(s): LABPROT, INR in the last 72 hours.    Imaging Studies: CT ABDOMEN PELVIS WO CONTRAST  Addendum Date: 07/16/2019   ADDENDUM  REPORT: 07/16/2019 11:41 ADDENDUM: Upon review of the images and the report, correction is made to the body of the report. In the section discussing the pancreas, the third sentence should be corrected to state: "NO common bile duct stones are seen." This than matches the impression of the report, which indicates an absence of common bile duct stones. Remainder of report as previously dictated by original interpreting radiologist. Electronically Signed   By: Lavonia Dana M.D.   On: 07/16/2019 11:41   Result Date: 07/16/2019 CLINICAL DATA:  Abdominal pain EXAM: CT ABDOMEN AND PELVIS without CONTRAST TECHNIQUE: Multidetector CT imaging of the abdomen and pelvis was performed following the standard protocol without IV contrast. COMPARISON:  None. FINDINGS: Lower chest: The visualized heart size within normal limits. No pericardial fluid/thickening. There is a small hiatal hernia present. Contrast is seen refluxing within the distal esophagus. Increased interstitial markings and patchy airspace opacity seen at both lung bases. Hepatobiliary: The liver is normal in density without focal abnormality.The main portal vein is patent. No evidence of calcified gallstones, gallbladder wall thickening or biliary dilatation. Pancreas: There is diffuse mesenteric fat stranding changes seen surrounding the entirety of the pancreas, predominantly around the pancreatic head and proximal body. No definite pancreatic ductal dilatation is seen. Common bile duct stones are seen. No loculated fluid collections is seen. Scattered small lymph nodes are noted. Parenchymal enhancement evaluation is  limited due to lack of intravenous contrast. Spleen: Normal in size without focal abnormality. Adrenals/Urinary Tract: Both adrenal glands appear normal. 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 Multiple bilateral renal calculi are noted. The largest measuring 1.2 cm in the upper pole the right kidney. There is mild bilateral  renal atrophy. There is a 1 cm low-density lesion seen off the upper pole of the left kidney. No hydronephrosis. The bladder is unremarkable. Stomach/Bowel: There is a small hiatal hernia. There is diffuse fat stranding changes and wall thickening seen around the second and third portion of the duodenum. The remainder of the small bowel is unremarkable.Scattered colonic diverticula are noted without diverticulitis. The appendix is normal in appearance. Vascular/Lymphatic: There are no enlarged mesenteric, retroperitoneal, or pelvic lymph nodes. Scattered aortic atherosclerotic calcifications are seen without aneurysmal dilatation. Reproductive: The prostate is unremarkable. Other: No evidence of abdominal wall mass or hernia. Musculoskeletal: No acute or significant osseous findings. IMPRESSION: 1. Patchy/streaky airspace opacity at both lung bases which could be due to atelectasis or infectious etiology. 2. Findings consistent with diffuse extensive acute pancreatitis, most notable about the proximal body and pancreatic head. No definite loculated fluid collections or common bile duct stones. 3. Adjacent inflammatory changes involving the duodenum. 4. Multiple bilateral non-obstructing renal calculi. 5. Diverticulosis without diverticulitis. 6.  Aortic Atherosclerosis (ICD10-I70.0). Electronically Signed: By: Prudencio Pair M.D. On: 07/16/2019 01:32   DG Chest 1 View  Result Date: 07/18/2019 CLINICAL DATA:  COVID-19 positivity with fevers EXAM: CHEST  1 VIEW COMPARISON:  07/15/2019 FINDINGS: Cardiac shadow is stable. Stable bibasilar opacities are seen. No focal confluent infiltrate is noted. No sizable effusion is seen. No bony abnormality is noted. IMPRESSION: No significant change from the prior exam. Electronically Signed   By: Inez Catalina M.D.   On: 07/18/2019 11:19   DG Chest 2 View  Result Date: 07/15/2019 CLINICAL DATA:  Chest pain. EXAM: CHEST - 2 VIEW COMPARISON:  12/01/2013 FINDINGS: The  cardiomediastinal silhouette is within normal limits. There are new mild opacities in both lung bases. The upper lungs are clear. No pleural effusion or pneumothorax is identified. No acute osseous abnormality is seen. IMPRESSION: New mild bibasilar opacities which could reflect early infection or atelectasis. Electronically Signed   By: Logan Bores M.D.   On: 07/15/2019 18:58   DG Abdomen 1 View  Result Date: 07/15/2019 CLINICAL DATA:  Patient c/o chest and abdominal pain that started yesterday. Reports no BM x 1 week. EXAM: ABDOMEN - 1 VIEW COMPARISON:  Abdominal radiograph 09/21/2014, CT abdomen pelvis 09/09/2013 FINDINGS: There are no dilated loops of bowel to suggest obstruction. No supine evidence for free air. Moderate stool burden. Small radiopaque densities projecting over the bilateral kidneys mostly representing renal stones. Lung bases are excluded from field of view. No acute finding in the visualized skeleton. IMPRESSION: 1. Nonobstructive bowel gas pattern.  Moderate stool burden. 2. Bilateral renal calculi. Electronically Signed   By: Audie Pinto M.D.   On: 07/15/2019 19:20   US Abdomen Limited RUQ  Result Date: 07/16/2019 CLINICAL DATA:  Abdominal pain. EXAM: ULTRASOUND ABDOMEN LIMITED RIGHT UPPER QUADRANT COMPARISON:  CT abdomen pelvis 07/16/2019 FINDINGS: Gallbladder: No gallstones or wall thickening visualized. No sonographic Murphy sign noted by sonographer. Common bile duct: Diameter: 0.2 cm, within normal limits Liver: Poor visualization due to shadowing bowel gas and increased echogenicity. No definite focal lesion identified. The liver parenchymal echogenicity appears diffusely increased. Portal vein is patent on color Doppler  imaging with normal direction of blood flow towards the liver. Other: None. IMPRESSION: 1.  No acute finding in the right upper quadrant. 2. Limited visualization of the liver due to shadowing bowel gas and increased echogenicity. Diffusely increased  parenchymal echogenicity most commonly seen with hepatic steatosis. Electronically Signed   By: Audie Pinto M.D.   On: 07/16/2019 11:54  [2 weeks]   Assessment: 64 year old male admitted with idiopathic pancreatitis in the setting of recent Covid infection, developed fever and leukocytosis over the weekend, afebrile over the past 24 hours.  Leukocytosis resolved.  Clinically feeling much improved.  Constipation likely multifactorial in the setting of Covid, pancreatitis.  Patient was able to have good bowel movement yesterday.  Elevated LFTs: Stable to improved today.  We will continue to follow as an outpatient.     Plan: 1. Okay for discharge from a GI standpoint. 2. Plan for MRI/MRCP in 6 to 8 weeks with follow-up appointment after that. 3. Continue Protonix twice daily. 4. We will need follow-up of LFTs as an outpatient.  Laureen Ochs. Bernarda Caffey [facility] [phone] 2/17/202110:03 AM     LOS: 6 days

## 2019-07-22 NOTE — Telephone Encounter (Signed)
MB, Spoke with pts significant other. Pt is in the hospital. I'm mailing a letter with enclosed lab orders to pts home. Pt will call our office if he has any questions or concerns about his procedure and labs needed.  Erline Levine- pt will need a f/u apt after MRCP/MRI.

## 2019-07-22 NOTE — Telephone Encounter (Signed)
MRI/MRCP scheduled for 09/08/19 at 11:00am, arrive at 10:30am. NPO 4 hours before test. Appt letter mailed to pt since he is hospitalized.

## 2019-07-22 NOTE — Telephone Encounter (Signed)
Patient will also need LFTs next week in addition to creatinine prior to MRI.

## 2019-07-22 NOTE — Telephone Encounter (Signed)
Noted, lab orders placed for lfts and mailed to pt.

## 2019-07-23 LAB — CULTURE, BLOOD (ROUTINE X 2)
Culture: NO GROWTH
Culture: NO GROWTH
Special Requests: ADEQUATE
Special Requests: ADEQUATE

## 2019-07-28 ENCOUNTER — Other Ambulatory Visit: Payer: Self-pay

## 2019-07-29 ENCOUNTER — Encounter: Payer: Self-pay | Admitting: Nurse Practitioner

## 2019-07-29 ENCOUNTER — Ambulatory Visit: Payer: BC Managed Care – PPO | Admitting: Nurse Practitioner

## 2019-07-29 VITALS — BP 129/80 | HR 106 | Temp 96.4°F | Ht 73.0 in | Wt 172.0 lb

## 2019-07-29 DIAGNOSIS — Z09 Encounter for follow-up examination after completed treatment for conditions other than malignant neoplasm: Secondary | ICD-10-CM | POA: Diagnosis not present

## 2019-07-29 DIAGNOSIS — E119 Type 2 diabetes mellitus without complications: Secondary | ICD-10-CM | POA: Diagnosis not present

## 2019-07-29 NOTE — Patient Instructions (Signed)
Carbohydrate Counting for Diabetes Mellitus, Adult  Carbohydrate counting is a method of keeping track of how many carbohydrates you eat. Eating carbohydrates naturally increases the amount of sugar (glucose) in the blood. Counting how many carbohydrates you eat helps keep your blood glucose within normal limits, which helps you manage your diabetes (diabetes mellitus). It is important to know how many carbohydrates you can safely have in each meal. This is different for every person. A diet and nutrition specialist (registered dietitian) can help you make a meal plan and calculate how many carbohydrates you should have at each meal and snack. Carbohydrates are found in the following foods:  Grains, such as breads and cereals.  Dried beans and soy products.  Starchy vegetables, such as potatoes, peas, and corn.  Fruit and fruit juices.  Milk and yogurt.  Sweets and snack foods, such as cake, cookies, candy, chips, and soft drinks. How do I count carbohydrates? There are two ways to count carbohydrates in food. You can use either of the methods or a combination of both. Reading "Nutrition Facts" on packaged food The "Nutrition Facts" list is included on the labels of almost all packaged foods and beverages in the [place] It includes:  The serving size.  Information about nutrients in each serving, including the grams (g) of carbohydrate per serving. To use the "Nutrition Facts":  Decide how many servings you will have.  Multiply the number of servings by the number of carbohydrates per serving.  The resulting number is the total amount of carbohydrates that you will be having. Learning standard serving sizes of other foods When you eat carbohydrate foods that are not packaged or do not include "Nutrition Facts" on the label, you need to measure the servings in order to count the amount of carbohydrates:  Measure the foods that you will eat with a food scale or measuring cup, if  needed.  Decide how many standard-size servings you will eat.  Multiply the number of servings by 15. Most carbohydrate-rich foods have about 15 g of carbohydrates per serving. ? For example, if you eat 8 oz (170 g) of strawberries, you will have eaten 2 servings and 30 g of carbohydrates (2 servings x 15 g = 30 g).  For foods that have more than one food mixed, such as soups and casseroles, you must count the carbohydrates in each food that is included. The following list contains standard serving sizes of common carbohydrate-rich foods. Each of these servings has about 15 g of carbohydrates:   hamburger bun or  English muffin.   oz (15 mL) syrup.   oz (14 g) jelly.  1 slice of bread.  1 six-inch tortilla.  3 oz (85 g) cooked rice or pasta.  4 oz (113 g) cooked dried beans.  4 oz (113 g) starchy vegetable, such as peas, corn, or potatoes.  4 oz (113 g) hot cereal.  4 oz (113 g) mashed potatoes or  of a large baked potato.  4 oz (113 g) canned or frozen fruit.  4 oz (120 mL) fruit juice.  4-6 crackers.  6 chicken nuggets.  6 oz (170 g) unsweetened dry cereal.  6 oz (170 g) plain fat-free yogurt or yogurt sweetened with artificial sweeteners.  8 oz (240 mL) milk.  8 oz (170 g) fresh fruit or one small piece of fruit.  24 oz (680 g) popped popcorn. Example of carbohydrate counting Sample meal  3 oz (85 g) chicken breast.  6 oz (170 g)   brown rice.  4 oz (113 g) corn.  8 oz (240 mL) milk.  8 oz (170 g) strawberries with sugar-free whipped topping. Carbohydrate calculation 1. Identify the foods that contain carbohydrates: ? Rice. ? Corn. ? Milk. ? Strawberries. 2. Calculate how many servings you have of each food: ? 2 servings rice. ? 1 serving corn. ? 1 serving milk. ? 1 serving strawberries. 3. Multiply each number of servings by 15 g: ? 2 servings rice x 15 g = 30 g. ? 1 serving corn x 15 g = 15 g. ? 1 serving milk x 15 g = 15 g. ? 1  serving strawberries x 15 g = 15 g. 4. Add together all of the amounts to find the total grams of carbohydrates eaten: ? 30 g + 15 g + 15 g + 15 g = 75 g of carbohydrates total. Summary  Carbohydrate counting is a method of keeping track of how many carbohydrates you eat.  Eating carbohydrates naturally increases the amount of sugar (glucose) in the blood.  Counting how many carbohydrates you eat helps keep your blood glucose within normal limits, which helps you manage your diabetes.  A diet and nutrition specialist (registered dietitian) can help you make a meal plan and calculate how many carbohydrates you should have at each meal and snack. This information is not intended to replace advice given to you by your health care provider. Make sure you discuss any questions you have with your health care provider. Document Revised: 12/13/2016 Document Reviewed: 11/02/2015 Elsevier Patient Education  2020 [employer]vier Inc.  

## 2019-07-29 NOTE — Progress Notes (Signed)
Subjective:    Patient ID: Philip Richardson, male    DOB: 1956-03-20, 63 y.o.   MRN: [id number]   Chief Complaint: Hospitalization Follow-up   HPI Patient had since infection at end of [place]. He was given antibiotic and got no better. [facility] and he tested positive for covid.  During covid he had a very poor appetite and was getting weak. He stopped taing all of his meds and his blood sugar shot up. He was admitted to b\the hospital for over a week. While he was there they gave him insulin and sent him home on insulin. He was not in insulin prior to admission. He is back on his metformin and and amaryl and wants to stop levemir. Last hgba1c in office was 8.7 (12/30/20000. In hospital hgba1c was 12.6 (07/17/19). His blood sugars since he has been home have been averaging around 162.   Review of Systems  Constitutional: Negative for diaphoresis.  Eyes: Negative for pain.  Respiratory: Negative for shortness of breath.   Cardiovascular: Negative for chest pain, palpitations and leg swelling.  Gastrointestinal: Negative for abdominal pain.  Endocrine: Negative for polydipsia.  Skin: Negative for rash.  Neurological: Negative for dizziness, weakness and headaches.  Hematological: Does not bruise/bleed easily.  All other systems reviewed and are negative.      Objective:   Physical Exam Vitals and nursing note reviewed.  Constitutional:      Appearance: Normal appearance. He is well-developed.  HENT:     Head: Normocephalic.     Nose: Nose normal.  Eyes:     Pupils: Pupils are equal, round, and reactive to light.  Neck:     Thyroid: No thyroid mass or thyromegaly.     Vascular: No carotid bruit or JVD.     Trachea: Phonation normal.  Cardiovascular:     Rate and Rhythm: Normal rate and regular rhythm.  Pulmonary:     Effort: Pulmonary effort is normal. No respiratory distress.     Breath sounds: Normal breath sounds.  Abdominal:     General:  Bowel sounds are normal.     Palpations: Abdomen is soft.     Tenderness: There is no abdominal tenderness.  Musculoskeletal:        General: Normal range of motion.     Cervical back: Normal range of motion and neck supple.  Lymphadenopathy:     Cervical: No cervical adenopathy.  Skin:    General: Skin is warm and dry.  Neurological:     Mental Status: He is alert and oriented to person, place, and time.  Psychiatric:        Behavior: Behavior normal.        Thought Content: Thought content normal.        Judgment: Judgment normal.     BP 129/80   Pulse (!) 106   Temp (!) 96.4 F (35.8 C)   Ht 6\' 1"  (1.854 m)   Wt 172 lb (78 kg)   SpO2 100%   BMI 22.69 kg/m        Assessment & Plan:  Philip Richardson in today with chief complaint of Hospitalization Follow-up   1. Type 2 diabetes mellitus without complication, without long-term current use of insulin ([place]) Continue all diabetic meds, including levemir for now Watch carbs in diet Keep diary of blood sugars Keep follow up appointment in March  2. Hospital discharge follow-up Hospital records reviewed    The above assessment and  management plan was discussed with the patient. The patient verbalized understanding of and has agreed to the management plan. Patient is aware to call the clinic if symptoms persist or worsen. Patient is aware when to return to the clinic for a follow-up visit. Patient educated on when it is appropriate to go to the emergency department.   Mary-Margaret Hassell Done, FNP

## 2019-07-31 DIAGNOSIS — E119 Type 2 diabetes mellitus without complications: Secondary | ICD-10-CM | POA: Diagnosis not present

## 2019-07-31 DIAGNOSIS — H40033 Anatomical narrow angle, bilateral: Secondary | ICD-10-CM | POA: Diagnosis not present

## 2019-08-03 ENCOUNTER — Telehealth: Payer: Self-pay | Admitting: *Deleted

## 2019-08-03 NOTE — Telephone Encounter (Signed)
PA for MRI has been approved via [employer] website. Auth# [id number] dates 08/03/2019-01/29/2020

## 2019-09-01 ENCOUNTER — Ambulatory Visit: Payer: BC Managed Care – PPO | Admitting: Nurse Practitioner

## 2019-09-01 ENCOUNTER — Other Ambulatory Visit: Payer: Self-pay

## 2019-09-01 ENCOUNTER — Encounter: Payer: Self-pay | Admitting: Nurse Practitioner

## 2019-09-01 VITALS — BP 128/78 | HR 86 | Temp 97.5°F | Resp 20 | Ht 73.0 in | Wt 176.0 lb

## 2019-09-01 DIAGNOSIS — K219 Gastro-esophageal reflux disease without esophagitis: Secondary | ICD-10-CM | POA: Diagnosis not present

## 2019-09-01 DIAGNOSIS — E119 Type 2 diabetes mellitus without complications: Secondary | ICD-10-CM | POA: Diagnosis not present

## 2019-09-01 DIAGNOSIS — K5900 Constipation, unspecified: Secondary | ICD-10-CM

## 2019-09-01 DIAGNOSIS — E785 Hyperlipidemia, unspecified: Secondary | ICD-10-CM | POA: Diagnosis not present

## 2019-09-01 DIAGNOSIS — I10 Essential (primary) hypertension: Secondary | ICD-10-CM

## 2019-09-01 DIAGNOSIS — E875 Hyperkalemia: Secondary | ICD-10-CM

## 2019-09-01 LAB — BAYER DCA HB A1C WAIVED: HB A1C (BAYER DCA - WAIVED): 12.1 % — ABNORMAL HIGH (ref <7.0)

## 2019-09-01 MED ORDER — GLIMEPIRIDE 4 MG PO TABS: 4.0000 mg | ORAL_TABLET | Freq: Every day | ORAL | 1 refills | Status: DC

## 2019-09-01 MED ORDER — METFORMIN HCL 1000 MG PO TABS: 1000.0000 mg | ORAL_TABLET | Freq: Two times a day (BID) | ORAL | 1 refills | Status: DC

## 2019-09-01 MED ORDER — PANTOPRAZOLE SODIUM 40 MG PO TBEC: 40.0000 mg | DELAYED_RELEASE_TABLET | Freq: Two times a day (BID) | ORAL | 1 refills | Status: DC

## 2019-09-01 MED ORDER — LISINOPRIL-HYDROCHLOROTHIAZIDE 20-12.5 MG PO TABS: 2.0000 | ORAL_TABLET | Freq: Every day | ORAL | 1 refills | Status: DC

## 2019-09-01 MED ORDER — INSULIN DETEMIR 100 UNIT/ML FLEXPEN: 15.0000 [IU] | Freq: Every day | SUBCUTANEOUS | 2 refills | Status: DC

## 2019-09-01 MED ORDER — AMLODIPINE BESYLATE 10 MG PO TABS: 10.0000 mg | ORAL_TABLET | Freq: Every day | ORAL | 1 refills | Status: DC

## 2019-09-01 NOTE — Patient Instructions (Signed)
Carbohydrate Counting for Diabetes Mellitus, Adult  Carbohydrate counting is a method of keeping track of how many carbohydrates you eat. Eating carbohydrates naturally increases the amount of sugar (glucose) in the blood. Counting how many carbohydrates you eat helps keep your blood glucose within normal limits, which helps you manage your diabetes (diabetes mellitus). It is important to know how many carbohydrates you can safely have in each meal. This is different for every person. A diet and nutrition specialist (registered dietitian) can help you make a meal plan and calculate how many carbohydrates you should have at each meal and snack. Carbohydrates are found in the following foods:  Grains, such as breads and cereals.  Dried beans and soy products.  Starchy vegetables, such as potatoes, peas, and corn.  Fruit and fruit juices.  Milk and yogurt.  Sweets and snack foods, such as cake, cookies, candy, chips, and soft drinks. How do I count carbohydrates? There are two ways to count carbohydrates in food. You can use either of the methods or a combination of both. Reading "Nutrition Facts" on packaged food The "Nutrition Facts" list is included on the labels of almost all packaged foods and beverages in the [place] It includes:  The serving size.  Information about nutrients in each serving, including the grams (g) of carbohydrate per serving. To use the "Nutrition Facts":  Decide how many servings you will have.  Multiply the number of servings by the number of carbohydrates per serving.  The resulting number is the total amount of carbohydrates that you will be having. Learning standard serving sizes of other foods When you eat carbohydrate foods that are not packaged or do not include "Nutrition Facts" on the label, you need to measure the servings in order to count the amount of carbohydrates:  Measure the foods that you will eat with a food scale or measuring cup, if  needed.  Decide how many standard-size servings you will eat.  Multiply the number of servings by 15. Most carbohydrate-rich foods have about 15 g of carbohydrates per serving. ? For example, if you eat 8 oz (170 g) of strawberries, you will have eaten 2 servings and 30 g of carbohydrates (2 servings x 15 g = 30 g).  For foods that have more than one food mixed, such as soups and casseroles, you must count the carbohydrates in each food that is included. The following list contains standard serving sizes of common carbohydrate-rich foods. Each of these servings has about 15 g of carbohydrates:   hamburger bun or  English muffin.   oz (15 mL) syrup.   oz (14 g) jelly.  1 slice of bread.  1 six-inch tortilla.  3 oz (85 g) cooked rice or pasta.  4 oz (113 g) cooked dried beans.  4 oz (113 g) starchy vegetable, such as peas, corn, or potatoes.  4 oz (113 g) hot cereal.  4 oz (113 g) mashed potatoes or  of a large baked potato.  4 oz (113 g) canned or frozen fruit.  4 oz (120 mL) fruit juice.  4-6 crackers.  6 chicken nuggets.  6 oz (170 g) unsweetened dry cereal.  6 oz (170 g) plain fat-free yogurt or yogurt sweetened with artificial sweeteners.  8 oz (240 mL) milk.  8 oz (170 g) fresh fruit or one small piece of fruit.  24 oz (680 g) popped popcorn. Example of carbohydrate counting Sample meal  3 oz (85 g) chicken breast.  6 oz (170 g)   brown rice.  4 oz (113 g) corn.  8 oz (240 mL) milk.  8 oz (170 g) strawberries with sugar-free whipped topping. Carbohydrate calculation 1. Identify the foods that contain carbohydrates: ? Rice. ? Corn. ? Milk. ? Strawberries. 2. Calculate how many servings you have of each food: ? 2 servings rice. ? 1 serving corn. ? 1 serving milk. ? 1 serving strawberries. 3. Multiply each number of servings by 15 g: ? 2 servings rice x 15 g = 30 g. ? 1 serving corn x 15 g = 15 g. ? 1 serving milk x 15 g = 15 g. ? 1  serving strawberries x 15 g = 15 g. 4. Add together all of the amounts to find the total grams of carbohydrates eaten: ? 30 g + 15 g + 15 g + 15 g = 75 g of carbohydrates total. Summary  Carbohydrate counting is a method of keeping track of how many carbohydrates you eat.  Eating carbohydrates naturally increases the amount of sugar (glucose) in the blood.  Counting how many carbohydrates you eat helps keep your blood glucose within normal limits, which helps you manage your diabetes.  A diet and nutrition specialist (registered dietitian) can help you make a meal plan and calculate how many carbohydrates you should have at each meal and snack. This information is not intended to replace advice given to you by your health care provider. Make sure you discuss any questions you have with your health care provider. Document Revised: 12/13/2016 Document Reviewed: 11/02/2015 Elsevier Patient Education  2020 [employer]vier Inc.  

## 2019-09-01 NOTE — Progress Notes (Signed)
Subjective:    Patient ID: Philip Richardson, male    DOB: 1955-06-20, 64 y.o.   MRN: [id number]   Chief Complaint: Medical Management of Chronic Issues    HPI:  1. Type 2 diabetes mellitus without complication, without long-term current use of insulin ([name]) Patient was in the hospital last month with hyperglycemia and covid. He had stopped his diabetes meds and his HGBA1c went up to 12.6. he has since started taking his meds again. Fasting blood sugar running 160-190. He denies nay low blood sugars. Lab Results  Component Value Date   HGBA1C 12.6 (H) 07/17/2019     2. Essential hypertension No c/o chest pain, sob or headache. Does not check blood pressure at home. BP Readings from Last 3 Encounters:  07/29/19 129/80  07/22/19 140/70  06/03/19 138/73     3. Hyperlipidemia with target LDL less than 100 Has ben watching diet the last month but is not doing much exercise. Lab Results  Component Value Date   CHOL 215 (H) 06/03/2019   HDL 47 06/03/2019   LDLCALC 138 (H) 06/03/2019   LDLDIRECT 113 (H) 04/08/2018   TRIG 165 (H) 06/03/2019   CHOLHDL 4.6 06/03/2019     4. Gastroesophageal reflux disease without esophagitis Is on protonix daily and that works well to keep his symptoms under control  5. Dyslipidemia Doe snot watch fats in diet very well.  6. Hyperkalemia No c/o lower ext cramps Lab Results  Component Value Date   K 4.3 07/22/2019     7. Constipation, unspecified constipation type Has been doing well since got out of hospital.    Outpatient Encounter Medications as of 09/01/2019  Medication Sig  . albuterol (VENTOLIN HFA) 108 (90 Base) MCG/ACT inhaler Inhale 2 puffs into the lungs every 6 (six) hours as needed for wheezing or shortness of breath.   Marland Kitchen amLODipine (NORVASC) 10 MG tablet Take 1 tablet (10 mg total) by mouth daily.  Marland Kitchen glimepiride (AMARYL) 4 MG tablet Take 1 tablet (4 mg total) by mouth daily before breakfast.  . insulin detemir (LEVEMIR)  100 unit/ml SOLN Inject 0.12 mLs (12 Units total) into the skin daily.  . Insulin Pen Needle (PEN NEEDLES) 31G X 8 MM MISC Use to inject insulin daily as prescribed  . lisinopril-hydrochlorothiazide (ZESTORETIC) 20-12.5 MG tablet Take 2 tablets by mouth daily. (Patient taking differently: Take 1 tablet by mouth 2 (two) times daily. )  . metFORMIN (GLUCOPHAGE) 1000 MG tablet Take 1 tablet (1,000 mg total) by mouth 2 (two) times daily with a meal.  . Multiple Vitamin (MULTIVITAMIN WITH MINERALS) TABS tablet Take 1 tablet by mouth daily.  . pantoprazole (PROTONIX) 40 MG tablet Take 1 tablet (40 mg total) by mouth 2 (two) times daily before a meal.     Past Surgical History:  Procedure Laterality Date  . CYSTOSCOPY W/ URETERAL STENT PLACEMENT Bilateral 12/09/2013   Procedure: CYSTOSCOPY WITH RETROGRADE PYELOGRAM/URETERAL STENT PLACEMENT;  Surgeon: Alexis Frock, MD;  Location: [facility];  Service: Urology;  Laterality: Bilateral;  . CYSTOSCOPY WITH RETROGRADE PYELOGRAM, URETEROSCOPY AND STENT PLACEMENT Bilateral 09/30/2013   Procedure: CYSTOSCOPY WITH BILATERAL RETROGRADE PYELOGRAM, LEFT DIAGNOSTIC URETEROSCOPY AND Left ureteral stent;  Surgeon: Alexis Frock, MD;  [facility];  Service: Urology;  Laterality: Bilateral;  . PERCUTANEOUS NEPHROLITHOTRIPSY  2005  . ROBOT ASSISTED PYELOPLASTY N/A 12/09/2013   Procedure: ROBOTIC ASSISTED BILATERAL PYELOLITHOTOMY, RIGHT  PYELOPLASTY ;  Surgeon: Alexis Frock, MD;  Location: [facility];  Service: Urology;  Laterality: N/A;    Family History  Problem Relation Age of Onset  . Cancer Mother   . Colon cancer Neg Hx   . Pancreatitis Neg Hx     New complaints: None today  Social history: Lives with his girlfriend  Controlled substance contract: n/a    Review of Systems  Constitutional: Negative for diaphoresis.  Eyes: Negative for pain.  Respiratory: Negative for shortness of breath.   Cardiovascular: Negative for chest pain,  palpitations and leg swelling.  Gastrointestinal: Negative for abdominal pain.  Endocrine: Negative for polydipsia.  Skin: Negative for rash.  Neurological: Negative for dizziness, weakness and headaches.  Hematological: Does not bruise/bleed easily.  All other systems reviewed and are negative.      Objective:   Physical Exam Vitals and nursing note reviewed.  Constitutional:      Appearance: Normal appearance. He is well-developed.  HENT:     Head: Normocephalic.     Nose: Nose normal.  Eyes:     Pupils: Pupils are equal, round, and reactive to light.  Neck:     Thyroid: No thyroid mass or thyromegaly.     Vascular: No carotid bruit or JVD.     Trachea: Phonation normal.  Cardiovascular:     Rate and Rhythm: Normal rate and regular rhythm.  Pulmonary:     Effort: Pulmonary effort is normal. No respiratory distress.     Breath sounds: Normal breath sounds.  Abdominal:     General: Bowel sounds are normal.     Palpations: Abdomen is soft.     Tenderness: There is no abdominal tenderness.  Musculoskeletal:        General: Normal range of motion.     Cervical back: Normal range of motion and neck supple.  Lymphadenopathy:     Cervical: No cervical adenopathy.  Skin:    General: Skin is warm and dry.  Neurological:     Mental Status: He is alert and oriented to person, place, and time.  Psychiatric:        Behavior: Behavior normal.        Thought Content: Thought content normal.        Judgment: Judgment normal.    BP 128/78   Pulse 86   Temp (!) 97.5 F (36.4 C) (Temporal)   Resp 20   Ht '6\' 1"'  (1.854 m)   Wt 176 lb (79.8 kg)   SpO2 100%   BMI 23.22 kg/m         Assessment & Plan:  Philip Richardson comes in today with chief complaint of Medical Management of Chronic Issues   Diagnosis and orders addressed:  1. Type 2 diabetes mellitus without complication, without long-term current use of insulin ([facility]) Strict carb counting Increase levemir to 15u  daily - Bayer DCA Hb A1c Waived - Microalbumin / creatinine urine ratio - insulin detemir (LEVEMIR) 100 unit/ml SOLN; Inject 0.15 mLs (15 Units total) into the skin daily.  Dispense: 3 mL; Refill: 2 - metFORMIN (GLUCOPHAGE) 1000 MG tablet; Take 1 tablet (1,000 mg total) by mouth 2 (two) times daily with a meal.  Dispense: 180 tablet; Refill: 1 - glimepiride (AMARYL) 4 MG tablet; Take 1 tablet (4 mg total) by mouth daily before breakfast.  Dispense: 90 tablet; Refill: 1  2. Essential hypertension Low sodium diet - CBC with Differential/Platelet - CMP14+EGFR - amLODipine (NORVASC) 10 MG tablet; Take 1 tablet (10 mg total) by mouth daily.  Dispense: 90 tablet; Refill: 1 - lisinopril-hydrochlorothiazide (ZESTORETIC) 20-12.5  MG tablet; Take 2 tablets by mouth daily.  Dispense: 180 tablet; Refill: 1  3. Hyperlipidemia with target LDL less than 100 Low fat diet and exercise encouraged - Lipid panel  4. Gastroesophageal reflux disease without esophagitis Avoid spicy foods Do not eat 2 hours prior to bedtime - pantoprazole (PROTONIX) 40 MG tablet; Take 1 tablet (40 mg total) by mouth 2 (two) times daily before a meal.  Dispense: 60 tablet; Refill: 1  5. Dyslipidemia Low fat diet  6. Hyperkalemia Labs pending  7. Constipation, unspecified constipation type Force fluids Increase fiber in diet   Labs pending Health Maintenance reviewed Diet and exercise encouraged  Follow up plan: 3 months   Mary-Margaret Hassell Done, FNP

## 2019-09-02 LAB — LIPID PANEL
Chol/HDL Ratio: 5.3 ratio — ABNORMAL HIGH (ref 0.0–5.0)
Cholesterol, Total: 222 mg/dL — ABNORMAL HIGH (ref 100–199)
HDL: 42 mg/dL (ref >39)
LDL Chol Calc (NIH): 153 mg/dL — ABNORMAL HIGH (ref 0–99)
Triglycerides: 146 mg/dL (ref 0–149)
VLDL Cholesterol Cal: 27 mg/dL (ref 5–40)

## 2019-09-02 LAB — CMP14+EGFR
ALT: 14 IU/L (ref 0–44)
AST: 16 IU/L (ref 0–40)
Albumin/Globulin Ratio: 1.4 (ref 1.2–2.2)
Albumin: 3.9 g/dL (ref 3.8–4.8)
Alkaline Phosphatase: 101 IU/L (ref 39–117)
BUN/Creatinine Ratio: 12 (ref 10–24)
BUN: 22 mg/dL (ref 8–27)
Bilirubin Total: 0.3 mg/dL (ref 0.0–1.2)
CO2: 23 mmol/L (ref 20–29)
Calcium: 9.6 mg/dL (ref 8.6–10.2)
Chloride: 98 mmol/L (ref 96–106)
Creatinine, Ser: 1.82 mg/dL — ABNORMAL HIGH (ref 0.76–1.27)
GFR calc Af Amer: 45 mL/min/{1.73_m2} — ABNORMAL LOW (ref >59)
GFR calc non Af Amer: 39 mL/min/{1.73_m2} — ABNORMAL LOW (ref >59)
Globulin, Total: 2.7 g/dL (ref 1.5–4.5)
Glucose: 287 mg/dL — ABNORMAL HIGH (ref 65–99)
Potassium: 4.9 mmol/L (ref 3.5–5.2)
Sodium: 139 mmol/L (ref 134–144)
Total Protein: 6.6 g/dL (ref 6.0–8.5)

## 2019-09-02 LAB — CBC WITH DIFFERENTIAL/PLATELET
Basophils Absolute: 0.1 10*3/uL (ref 0.0–0.2)
Basos: 1 %
EOS (ABSOLUTE): 0.1 10*3/uL (ref 0.0–0.4)
Eos: 2 %
Hematocrit: 31.5 % — ABNORMAL LOW (ref 37.5–51.0)
Hemoglobin: 10.2 g/dL — ABNORMAL LOW (ref 13.0–17.7)
Immature Grans (Abs): 0 10*3/uL (ref 0.0–0.1)
Immature Granulocytes: 0 %
Lymphocytes Absolute: 1.2 10*3/uL (ref 0.7–3.1)
Lymphs: 21 %
MCH: 27.4 pg (ref 26.6–33.0)
MCHC: 32.4 g/dL (ref 31.5–35.7)
MCV: 85 fL (ref 79–97)
Monocytes Absolute: 0.4 10*3/uL (ref 0.1–0.9)
Monocytes: 7 %
Neutrophils Absolute: 3.8 10*3/uL (ref 1.4–7.0)
Neutrophils: 69 %
Platelets: 248 10*3/uL (ref 150–450)
RBC: 3.72 x10E6/uL — ABNORMAL LOW (ref 4.14–5.80)
RDW: 14.8 % (ref 11.6–15.4)
WBC: 5.6 10*3/uL (ref 3.4–10.8)

## 2019-09-04 ENCOUNTER — Other Ambulatory Visit (HOSPITAL_COMMUNITY): Admission: RE | Admit: 2019-09-04 | Payer: BC Managed Care – PPO | Source: Ambulatory Visit | Admitting: *Deleted

## 2019-09-04 DIAGNOSIS — Z79899 Other long term (current) drug therapy: Secondary | ICD-10-CM | POA: Diagnosis not present

## 2019-09-04 DIAGNOSIS — K8500 Idiopathic acute pancreatitis without necrosis or infection: Secondary | ICD-10-CM | POA: Diagnosis not present

## 2019-09-05 LAB — HEPATIC FUNCTION PANEL
ALT: 15 IU/L (ref 0–44)
AST: 18 IU/L (ref 0–40)
Albumin: 4.5 g/dL (ref 3.8–4.8)
Alkaline Phosphatase: 103 IU/L (ref 39–117)
Bilirubin Total: 0.4 mg/dL (ref 0.0–1.2)
Bilirubin, Direct: 0.11 mg/dL (ref 0.00–0.40)
Total Protein: 7 g/dL (ref 6.0–8.5)

## 2019-09-05 LAB — CREATININE, SERUM
Creatinine, Ser: 1.68 mg/dL — ABNORMAL HIGH (ref 0.76–1.27)
GFR calc Af Amer: 49 mL/min/{1.73_m2} — ABNORMAL LOW (ref >59)
GFR calc non Af Amer: 43 mL/min/{1.73_m2} — ABNORMAL LOW (ref >59)

## 2019-09-08 ENCOUNTER — Ambulatory Visit (HOSPITAL_COMMUNITY): Admit: 2019-09-08 | Payer: BC Managed Care – PPO

## 2019-09-30 ENCOUNTER — Ambulatory Visit: Payer: BC Managed Care – PPO | Admitting: Gastroenterology

## 2019-10-05 ENCOUNTER — Ambulatory Visit (HOSPITAL_COMMUNITY): Payer: BC Managed Care – PPO

## 2019-10-07 ENCOUNTER — Ambulatory Visit (HOSPITAL_COMMUNITY)
Admission: RE | Admit: 2019-10-07 | Discharge: 2019-10-07 | Disposition: A | Discharge status: Home / Self Care | Payer: BC Managed Care – PPO | Source: Ambulatory Visit | Attending: Gastroenterology | Admitting: Gastroenterology

## 2019-10-07 ENCOUNTER — Other Ambulatory Visit: Payer: Self-pay | Admitting: Gastroenterology

## 2019-10-07 ENCOUNTER — Other Ambulatory Visit: Payer: Self-pay

## 2019-10-07 DIAGNOSIS — K8500 Idiopathic acute pancreatitis without necrosis or infection: Secondary | ICD-10-CM

## 2019-10-07 DIAGNOSIS — K859 Acute pancreatitis without necrosis or infection, unspecified: Secondary | ICD-10-CM | POA: Diagnosis not present

## 2019-10-07 DIAGNOSIS — R935 Abnormal findings on diagnostic imaging of other abdominal regions, including retroperitoneum: Secondary | ICD-10-CM | POA: Diagnosis not present

## 2019-10-07 DIAGNOSIS — K760 Fatty (change of) liver, not elsewhere classified: Secondary | ICD-10-CM | POA: Diagnosis not present

## 2019-10-07 MED ORDER — GADOBUTROL 1 MMOL/ML IV SOLN
7.0000 mL | Freq: Once | INTRAVENOUS | Status: AC | PRN
  Administered 2019-10-07: 7 mL via INTRAVENOUS

## 2019-10-07 NOTE — Progress Notes (Signed)
Referring Provider: Chevis Pretty, * Primary Care Physician:  Chevis Pretty, FNP  Primary GI: Dr. Gala Romney   Chief Complaint  Patient presents with  . Pancreatitis    doing ok    HPI:   Philip Richardson is a 64 y.o. male presenting today with a history of idiopathic pancreatitis in Feb 2021, returning for hospital follow-up. MRCP for dedicated pancreatic evaluation completed this week with resolving inflammatory changes of pancreas. No mass.   Gained 11 lbs since discharge. Good appetite. No abdominal pain. No N/V. No GERD breakthrough since starting Protonix BID, 30 minutes before eating.   Colonoscopy: no prior. Cologuard Sept 2019 was negative. EGD possibly 25-30 years ago and was told he had a hiatal hernia. Declining colonoscopy at this point.   Elevated Alk Phos and transaminases during hospitalization, now resolved and normal. No gallstones.     Past Medical History:  Diagnosis Date  . Frequency of urination   . GERD (gastroesophageal reflux disease)   . Horseshoe kidney    BILATERAL  . Hypertension   . Renal calculus, bilateral   . Type 2 diabetes mellitus ([place])   . Urgency of urination   . Wears dentures     Past Surgical History:  Procedure Laterality Date  . CYSTOSCOPY W/ URETERAL STENT PLACEMENT Bilateral 12/09/2013   Procedure: CYSTOSCOPY WITH RETROGRADE PYELOGRAM/URETERAL STENT PLACEMENT;  Surgeon: Alexis Frock, MD;  Location: [facility];  Service: Urology;  Laterality: Bilateral;  . CYSTOSCOPY WITH RETROGRADE PYELOGRAM, URETEROSCOPY AND STENT PLACEMENT Bilateral 09/30/2013   Procedure: CYSTOSCOPY WITH BILATERAL RETROGRADE PYELOGRAM, LEFT DIAGNOSTIC URETEROSCOPY AND Left ureteral stent;  Surgeon: Alexis Frock, MD;  Location: [facility];  Service: Urology;  Laterality: Bilateral;  . PERCUTANEOUS NEPHROLITHOTRIPSY  2005  . ROBOT ASSISTED PYELOPLASTY N/A 12/09/2013   Procedure: ROBOTIC ASSISTED BILATERAL PYELOLITHOTOMY, RIGHT   PYELOPLASTY ;  Surgeon: Alexis Frock, MD;  Location: [facility];  Service: Urology;  Laterality: N/A;    Current Outpatient Medications  Medication Sig Dispense Refill  . amLODipine (NORVASC) 10 MG tablet Take 1 tablet (10 mg total) by mouth daily. 90 tablet 1  . glimepiride (AMARYL) 4 MG tablet Take 1 tablet (4 mg total) by mouth daily before breakfast. 90 tablet 1  . insulin detemir (LEVEMIR) 100 unit/ml SOLN Inject 0.15 mLs (15 Units total) into the skin daily. 3 mL 2  . Insulin Pen Needle (PEN NEEDLES) 31G X 8 MM MISC Use to inject insulin daily as prescribed 150 each 1  . lisinopril-hydrochlorothiazide (ZESTORETIC) 20-12.5 MG tablet Take 2 tablets by mouth daily. 180 tablet 1  . metFORMIN (GLUCOPHAGE) 1000 MG tablet Take 1 tablet (1,000 mg total) by mouth 2 (two) times daily with a meal. 180 tablet 1  . Multiple Vitamin (MULTIVITAMIN WITH MINERALS) TABS tablet Take 1 tablet by mouth daily.    . pantoprazole (PROTONIX) 40 MG tablet Take 1 tablet (40 mg total) by mouth 2 (two) times daily before a meal. 60 tablet 1   No current facility-administered medications for this visit.    Allergies as of 10/09/2019 - Review Complete 10/09/2019  Allergen Reaction Noted  . Invokana [canagliflozin] Other (See Comments) 11/29/2015    Family History  Problem Relation Age of Onset  . Cancer Mother   . Colon cancer Neg Hx   . Pancreatitis Neg Hx     Social History   Socioeconomic History  . Marital status: Single    Spouse name: Not on file  . Number  of children: Not on file  . Years of education: Not on file  . Highest education level: Not on file  Occupational History  . Not on file  Tobacco Use  . Smoking status: Never Smoker  . Smokeless tobacco: Never Used  Substance and Sexual Activity  . Alcohol use: No    Comment: no history of etoh use  . Drug use: No  . Sexual activity: Not on file  Other Topics Concern  . Not on file  Social History Narrative  . Not on file   Social  Determinants of Health   Financial Resource Strain:   . Difficulty of Paying Living Expenses:   Food Insecurity:   . Worried About Charity fundraiser in the Last Year:   . Arboriculturist in the Last Year:   Transportation Needs:   . Film/video editor (Medical):   Marland Kitchen Lack of Transportation (Non-Medical):   Physical Activity:   . Days of Exercise per Week:   . Minutes of Exercise per Session:   Stress:   . Feeling of Stress :   Social Connections:   . Frequency of Communication with Friends and Family:   . Frequency of Social Gatherings with Friends and Family:   . Attends Religious Services:   . Active Member of Clubs or Organizations:   . Attends Archivist Meetings:   Marland Kitchen Marital Status:     Review of Systems: Gen: Denies fever, chills, anorexia. Denies fatigue, weakness, weight loss.  CV: Denies chest pain, palpitations, syncope, peripheral edema, and claudication. Resp: Denies dyspnea at rest, cough, wheezing, coughing up blood, and pleurisy. GI:see HPI  Derm: Denies rash, itching, dry skin Psych: Denies depression, anxiety, memory loss, confusion. No homicidal or suicidal ideation.  Heme: Denies bruising, bleeding, and enlarged lymph nodes.  Physical Exam: BP (!) 155/84   Pulse 72   Temp (!) 96.9 F (36.1 C) (Temporal)   Ht '6\' 1"'  (1.854 m)   Wt 183 lb 6.4 oz (83.2 kg)   BMI 24.20 kg/m  General:   Alert and oriented. No distress noted. Pleasant and cooperative.  Head:  Normocephalic and atraumatic. Eyes:  Conjuctiva clear without scleral icterus. Mouth:  Mask in place Abdomen:  +BS, soft, non-tender and non-distended. No rebound or guarding. No HSM or masses noted. Msk:  Symmetrical without gross deformities. Normal posture. Extremities:  Without edema. Neurologic:  Alert and  oriented x4 Psych:  Alert and cooperative. Normal mood and affect.  ASSESSMENT: Philip Richardson is a 64 y.o. male presenting today in hospital follow-up for acute  pancreatitis, felt to be idiopathic. Unable to rule out microlithiasis; I do note he had slight bump in transaminases and alk phos but now resolved. No gallstones. Clinically, he has had complete resolution of symptoms. Follow-up dedicated imaging via MRI unrevealing.   While inpatient, drop in Hgb to 7.7 without overt GI bleeding, 12.4 on admission. Recent CBC with Hgb 10.2 Will recheck CBC, iron studies in 6 weeks. Declining colonoscopy at this time. No prior colonoscopy.    PLAN:   CBC, iron studies in 6 weeks  Return in 6 months  Call with concerns in interim  Discussed need for screening colonoscopy: patient declining   Annitta Needs, PhD, ANP-BC [facility]

## 2019-10-09 ENCOUNTER — Other Ambulatory Visit: Payer: Self-pay

## 2019-10-09 ENCOUNTER — Encounter: Payer: Self-pay | Admitting: Gastroenterology

## 2019-10-09 ENCOUNTER — Ambulatory Visit: Payer: BC Managed Care – PPO | Admitting: Gastroenterology

## 2019-10-09 VITALS — BP 155/84 | HR 72 | Temp 96.9°F | Ht 73.0 in | Wt 183.4 lb

## 2019-10-09 DIAGNOSIS — K8500 Idiopathic acute pancreatitis without necrosis or infection: Secondary | ICD-10-CM

## 2019-10-09 DIAGNOSIS — D649 Anemia, unspecified: Secondary | ICD-10-CM | POA: Diagnosis not present

## 2019-10-09 DIAGNOSIS — K219 Gastro-esophageal reflux disease without esophagitis: Secondary | ICD-10-CM

## 2019-10-09 NOTE — Patient Instructions (Signed)
I am glad you are doing well!  Please call with any recurrent issues.  We will recheck blood work in 6 weeks. We will mail it to you.  I will see you in 6 months!  I enjoyed seeing you again today! As you know, I value our relationship and want to provide genuine, compassionate, and quality care. I welcome your feedback. If you receive a survey regarding your visit,  I greatly appreciate you taking time to fill this out. See you next time!  Annitta Needs, PhD, ANP-BC [facility] Gastroenter[facility]

## 2019-11-30 ENCOUNTER — Other Ambulatory Visit: Payer: Self-pay | Admitting: Nurse Practitioner

## 2019-11-30 DIAGNOSIS — K219 Gastro-esophageal reflux disease without esophagitis: Secondary | ICD-10-CM

## 2019-12-03 ENCOUNTER — Other Ambulatory Visit: Payer: Self-pay

## 2019-12-03 ENCOUNTER — Ambulatory Visit: Payer: BC Managed Care – PPO | Admitting: Nurse Practitioner

## 2019-12-03 ENCOUNTER — Encounter: Payer: Self-pay | Admitting: Nurse Practitioner

## 2019-12-03 VITALS — BP 138/76 | HR 85 | Temp 98.2°F | Resp 20 | Ht 73.0 in | Wt 188.0 lb

## 2019-12-03 DIAGNOSIS — E119 Type 2 diabetes mellitus without complications: Secondary | ICD-10-CM

## 2019-12-03 DIAGNOSIS — I10 Essential (primary) hypertension: Secondary | ICD-10-CM | POA: Diagnosis not present

## 2019-12-03 DIAGNOSIS — K219 Gastro-esophageal reflux disease without esophagitis: Secondary | ICD-10-CM

## 2019-12-03 DIAGNOSIS — R7989 Other specified abnormal findings of blood chemistry: Secondary | ICD-10-CM

## 2019-12-03 LAB — BAYER DCA HB A1C WAIVED: HB A1C (BAYER DCA - WAIVED): 8.9 % — ABNORMAL HIGH (ref <7.0)

## 2019-12-03 MED ORDER — METFORMIN HCL 1000 MG PO TABS: 1000.0000 mg | ORAL_TABLET | Freq: Two times a day (BID) | ORAL | 1 refills | Status: DC

## 2019-12-03 MED ORDER — LISINOPRIL-HYDROCHLOROTHIAZIDE 20-12.5 MG PO TABS: 2.0000 | ORAL_TABLET | Freq: Every day | ORAL | 1 refills | Status: DC

## 2019-12-03 MED ORDER — AMLODIPINE BESYLATE 10 MG PO TABS: 10.0000 mg | ORAL_TABLET | Freq: Every day | ORAL | 1 refills | Status: DC

## 2019-12-03 MED ORDER — LEVEMIR FLEXTOUCH 100 UNIT/ML ~~LOC~~ SOPN: 20.0000 [IU] | PEN_INJECTOR | Freq: Every day | SUBCUTANEOUS | 11 refills | Status: DC

## 2019-12-03 MED ORDER — PANTOPRAZOLE SODIUM 40 MG PO TBEC: DELAYED_RELEASE_TABLET | ORAL | 2 refills | Status: DC

## 2019-12-03 MED ORDER — GLIMEPIRIDE 4 MG PO TABS: 4.0000 mg | ORAL_TABLET | Freq: Every day | ORAL | 1 refills | Status: DC

## 2019-12-03 NOTE — Patient Instructions (Signed)
DASH Eating Plan DASH stands for "Dietary Approaches to Stop Hypertension." The DASH eating plan is a healthy eating plan that has been shown to reduce high blood pressure (hypertension). It may also reduce your risk for type 2 diabetes, heart disease, and stroke. The DASH eating plan may also help with weight loss. What are tips for following this plan?  General guidelines  Avoid eating more than 2,300 mg (milligrams) of salt (sodium) a day. If you have hypertension, you may need to reduce your sodium intake to 1,500 mg a day.  Limit alcohol intake to no more than 1 drink a day for nonpregnant women and 2 drinks a day for men. One drink equals 12 oz of beer, 5 oz of wine, or 1 oz of hard liquor.  Work with your health care provider to maintain a healthy body weight or to lose weight. Ask what an ideal weight is for you.  Get at least 30 minutes of exercise that causes your heart to beat faster (aerobic exercise) most days of the week. Activities may include walking, swimming, or biking.  Work with your health care provider or diet and nutrition specialist (dietitian) to adjust your eating plan to your individual calorie needs. Reading food labels   Check food labels for the amount of sodium per serving. Choose foods with less than 5 percent of the Daily Value of sodium. Generally, foods with less than 300 mg of sodium per serving fit into this eating plan.  To find whole grains, look for the word "whole" as the first word in the ingredient list. Shopping  Buy products labeled as "low-sodium" or "no salt added."  Buy fresh foods. Avoid canned foods and premade or frozen meals. Cooking  Avoid adding salt when cooking. Use salt-free seasonings or herbs instead of table salt or sea salt. Check with your health care provider or pharmacist before using salt substitutes.  Do not fry foods. Cook foods using healthy methods such as baking, boiling, grilling, and broiling instead.  Cook with  heart-healthy oils, such as olive, canola, soybean, or sunflower oil. Meal planning  Eat a balanced diet that includes: ? 5 or more servings of fruits and vegetables each day. At each meal, try to fill half of your plate with fruits and vegetables. ? Up to 6-8 servings of whole grains each day. ? Less than 6 oz of lean meat, poultry, or fish each day. A 3-oz serving of meat is about the same size as a deck of cards. One egg equals 1 oz. ? 2 servings of low-fat dairy each day. ? A serving of nuts, seeds, or beans 5 times each week. ? Heart-healthy fats. Healthy fats called Omega-3 fatty acids are found in foods such as flaxseeds and coldwater fish, like sardines, salmon, and mackerel.  Limit how much you eat of the following: ? Canned or prepackaged foods. ? Food that is high in trans fat, such as fried foods. ? Food that is high in saturated fat, such as fatty meat. ? Sweets, desserts, sugary drinks, and other foods with added sugar. ? Full-fat dairy products.  Do not salt foods before eating.  Try to eat at least 2 vegetarian meals each week.  Eat more home-cooked food and less restaurant, buffet, and fast food.  When eating at a restaurant, ask that your food be prepared with less salt or no salt, if possible. What foods are recommended? The items listed may not be a complete list. Talk with your dietitian about   what dietary choices are best for you. Grains Whole-grain or whole-wheat bread. Whole-grain or whole-wheat pasta. Brown rice. Oatmeal. Quinoa. Bulgur. Whole-grain and low-sodium cereals. Pita bread. Low-fat, low-sodium crackers. Whole-wheat flour tortillas. Vegetables Fresh or frozen vegetables (raw, steamed, roasted, or grilled). Low-sodium or reduced-sodium tomato and vegetable juice. Low-sodium or reduced-sodium tomato sauce and tomato paste. Low-sodium or reduced-sodium canned vegetables. Fruits All fresh, dried, or frozen fruit. Canned fruit in natural juice (without  added sugar). Meat and other protein foods Skinless chicken or turkey. Ground chicken or turkey. Pork with fat trimmed off. Fish and seafood. Egg whites. Dried beans, peas, or lentils. Unsalted nuts, nut butters, and seeds. Unsalted canned beans. Lean cuts of beef with fat trimmed off. Low-sodium, lean deli meat. Dairy Low-fat (1%) or fat-free (skim) milk. Fat-free, low-fat, or reduced-fat cheeses. Nonfat, low-sodium ricotta or cottage cheese. Low-fat or nonfat yogurt. Low-fat, low-sodium cheese. Fats and oils Soft margarine without trans fats. Vegetable oil. Low-fat, reduced-fat, or light mayonnaise and salad dressings (reduced-sodium). Canola, safflower, olive, soybean, and sunflower oils. Avocado. Seasoning and other foods Herbs. Spices. Seasoning mixes without salt. Unsalted popcorn and pretzels. Fat-free sweets. What foods are not recommended? The items listed may not be a complete list. Talk with your dietitian about what dietary choices are best for you. Grains Baked goods made with fat, such as croissants, muffins, or some breads. Dry pasta or rice meal packs. Vegetables Creamed or fried vegetables. Vegetables in a cheese sauce. Regular canned vegetables (not low-sodium or reduced-sodium). Regular canned tomato sauce and paste (not low-sodium or reduced-sodium). Regular tomato and vegetable juice (not low-sodium or reduced-sodium). Pickles. Olives. Fruits Canned fruit in a light or heavy syrup. Fried fruit. Fruit in cream or butter sauce. Meat and other protein foods Fatty cuts of meat. Ribs. Fried meat. Bacon. Sausage. Bologna and other processed lunch meats. Salami. Fatback. Hotdogs. Bratwurst. Salted nuts and seeds. Canned beans with added salt. Canned or smoked fish. Whole eggs or egg yolks. Chicken or turkey with skin. Dairy Whole or 2% milk, cream, and half-and-half. Whole or full-fat cream cheese. Whole-fat or sweetened yogurt. Full-fat cheese. Nondairy creamers. Whipped toppings.  Processed cheese and cheese spreads. Fats and oils Butter. Stick margarine. Lard. Shortening. Ghee. Bacon fat. Tropical oils, such as coconut, palm kernel, or palm oil. Seasoning and other foods Salted popcorn and pretzels. Onion salt, garlic salt, seasoned salt, table salt, and sea salt. Worcestershire sauce. Tartar sauce. Barbecue sauce. Teriyaki sauce. Soy sauce, including reduced-sodium. Steak sauce. Canned and packaged gravies. Fish sauce. Oyster sauce. Cocktail sauce. Horseradish that you find on the shelf. Ketchup. Mustard. Meat flavorings and tenderizers. Bouillon cubes. Hot sauce and Tabasco sauce. Premade or packaged marinades. Premade or packaged taco seasonings. Relishes. Regular salad dressings. Where to find more information:  National Heart, Lung, and Blood Institute: www.nhlbi.nih.gov  American Heart Association: www.heart.org Summary  The DASH eating plan is a healthy eating plan that has been shown to reduce high blood pressure (hypertension). It may also reduce your risk for type 2 diabetes, heart disease, and stroke.  With the DASH eating plan, you should limit salt (sodium) intake to 2,300 mg a day. If you have hypertension, you may need to reduce your sodium intake to 1,500 mg a day.  When on the DASH eating plan, aim to eat more fresh fruits and vegetables, whole grains, lean proteins, low-fat dairy, and heart-healthy fats.  Work with your health care provider or diet and nutrition specialist (dietitian) to adjust your eating plan to your   individual calorie needs. This information is not intended to replace advice given to you by your health care provider. Make sure you discuss any questions you have with your health care provider. Document Revised: 05/03/2017 Document Reviewed: 05/14/2016 Elsevier Patient Education  2020 [employer]  

## 2019-12-03 NOTE — Progress Notes (Signed)
Subjective:    Patient ID: Philip Richardson, male    DOB: Aug 08, 1955, 64 y.o.   MRN: [id number]  Chief Complaint: No chief complaint on file.    HPI:  1. Essential hypertension BP Readings from Last 3 Encounters:  12/03/19 138/76  10/09/19 (!) 155/84  09/01/19 128/78   Checks BP regularly, ~138 at home. No chest pain, SOB, or headaches. Takes medication as prescribed. Denies watching salt intake. Walks and bowls for exercise.  2. Gastroesophageal reflux disease, unspecified whether esophagitis present Takes medication as prescribed. Avoids triggers foods. Eats smaller meals.   3. Type 2 diabetes mellitus without complication, without long-term current use of insulin ([place]) Lab Results  Component Value Date   HGBA1C 12.1 (H) 09/01/2019   Denies checked CBG regularly. Takes medication as prescribed. Denies watching sugar and carb intake. Increased Levemir to 15 units last visit. HA1C is 8.1 today.    Outpatient Encounter Medications as of 12/03/2019  Medication Sig  . amLODipine (NORVASC) 10 MG tablet Take 1 tablet (10 mg total) by mouth daily.  Marland Kitchen glimepiride (AMARYL) 4 MG tablet Take 1 tablet (4 mg total) by mouth daily before breakfast.  . insulin detemir (LEVEMIR) 100 unit/ml SOLN Inject 0.15 mLs (15 Units total) into the skin daily.  . Insulin Pen Needle (PEN NEEDLES) 31G X 8 MM MISC Use to inject insulin daily as prescribed  . lisinopril-hydrochlorothiazide (ZESTORETIC) 20-12.5 MG tablet Take 2 tablets by mouth daily.  . metFORMIN (GLUCOPHAGE) 1000 MG tablet Take 1 tablet (1,000 mg total) by mouth 2 (two) times daily with a meal.  . Multiple Vitamin (MULTIVITAMIN WITH MINERALS) TABS tablet Take 1 tablet by mouth daily.  . pantoprazole (PROTONIX) 40 MG tablet TAKE (1) TABLET TWICE A DAY BEFORE MEALS.   No facility-administered encounter medications on file as of 12/03/2019.    Past Surgical History:  Procedure Laterality Date  . CYSTOSCOPY W/ URETERAL STENT PLACEMENT  Bilateral 12/09/2013   Procedure: CYSTOSCOPY WITH RETROGRADE PYELOGRAM/URETERAL STENT PLACEMENT;  Surgeon: Alexis Frock, MD;  Location: [facility];  Service: Urology;  Laterality: Bilateral;  . CYSTOSCOPY WITH RETROGRADE PYELOGRAM, URETEROSCOPY AND STENT PLACEMENT Bilateral 09/30/2013   Procedure: CYSTOSCOPY WITH BILATERAL RETROGRADE PYELOGRAM, LEFT DIAGNOSTIC URETEROSCOPY AND Left ureteral stent;  Surgeon: Alexis Frock, MD;  [facility]: [facility]  Service: Urology;  Laterality: Bilateral;  . PERCUTANEOUS NEPHROLITHOTRIPSY  2005  . ROBOT ASSISTED PYELOPLASTY N/A 12/09/2013   Procedure: ROBOTIC ASSISTED BILATERAL PYELOLITHOTOMY, RIGHT  PYELOPLASTY ;  Surgeon: Alexis Frock, MD;  Location: [facility];  Service: Urology;  Laterality: N/A;    Family History  Problem Relation Age of Onset  . Cancer Mother   . Colon cancer Neg Hx   . Pancreatitis Neg Hx     New complaints: No new complaints.  Social history: Lives at home alone, bowls, enjoys sports.   Controlled substance contract: n/a   Review of Systems  Constitutional: Negative.   HENT: Negative.   Eyes: Negative.   Respiratory: Negative.   Cardiovascular: Negative.   Gastrointestinal: Negative.   Endocrine: Negative.   Genitourinary: Negative.   Musculoskeletal: Negative.   Skin: Negative.   Allergic/Immunologic: Negative.   Neurological: Negative.   Hematological: Negative.   Psychiatric/Behavioral: Negative.   All other systems reviewed and are negative.      Objective:   Physical Exam Constitutional:      Appearance: Normal appearance.  HENT:     Head: Normocephalic and atraumatic.     Right Ear: Tympanic membrane and  external ear normal.     Left Ear: Tympanic membrane, ear canal and external ear normal.     Nose: Nose normal.     Mouth/Throat:     Mouth: Mucous membranes are moist.     Pharynx: Oropharynx is clear.  Eyes:     Extraocular Movements: Extraocular movements intact.      Conjunctiva/sclera: Conjunctivae normal.     Pupils: Pupils are equal, round, and reactive to light.  Cardiovascular:     Rate and Rhythm: Normal rate and regular rhythm.     Pulses: Normal pulses.     Heart sounds: Normal heart sounds.  Pulmonary:     Effort: Pulmonary effort is normal.     Breath sounds: Normal breath sounds.  Abdominal:     General: Bowel sounds are normal.     Palpations: Abdomen is soft.  Musculoskeletal:        General: Normal range of motion.     Cervical back: Normal range of motion and neck supple.  Skin:    General: Skin is warm and dry.     Capillary Refill: Capillary refill takes less than 2 seconds.  Neurological:     General: No focal deficit present.     Mental Status: He is alert and oriented to person, place, and time. Mental status is at baseline.  Psychiatric:        Mood and Affect: Mood normal.        Behavior: Behavior normal.        Thought Content: Thought content normal.        Judgment: Judgment normal.   BP 138/76   Pulse 85   Temp 98.2 F (36.8 C) (Temporal)   Resp 20   Ht '6\' 1"'  (1.854 m)   Wt 188 lb (85.3 kg)   SpO2 100%   BMI 24.80 kg/m        Assessment & Plan:  Philip Richardson comes in today with chief complaint of No chief complaint on file.   Diagnosis and orders addressed:  1. Essential hypertension Continue taking your medication as prescribed. Follow a heart healthy diet that is low in salt. Stay active.   - CBC with Differential/Platelet - CMP14+EGFR - Lipid panel  2. Gastroesophageal reflux disease, unspecified whether esophagitis present Avoid fatty and high fat foods. Do not lie down after big meals. Avoid trigger foods.   3. Type 2 diabetes mellitus without complication, without long-term current use of insulin (HCC) Avoid foods high in sugar or carbs. Continue taking medication as prescribed. Drink plenty of fluids to stay hydrated.   Increase Levemir to 20 units daily. Report any dizziness,  weakness, or sweating episodes that are out of the ordinary.   - Bayer DCA Hb A1c Waived - Microalbumin / creatinine urine ratio  Meds ordered this encounter  Medications  . lisinopril-hydrochlorothiazide (ZESTORETIC) 20-12.5 MG tablet    Sig: Take 2 tablets by mouth daily.    Dispense:  180 tablet    Refill:  1    Note dose change    Order Specific Question:   Supervising Provider    Answer:   Caryl Pina A [id number]  . amLODipine (NORVASC) 10 MG tablet    Sig: Take 1 tablet (10 mg total) by mouth daily.    Dispense:  90 tablet    Refill:  1    Order Specific Question:   Supervising Provider    Answer:   Caryl Pina A [id number]  . pantoprazole (PROTONIX) 40  MG tablet    Sig: TAKE (1) TABLET TWICE A DAY BEFORE MEALS.    Dispense:  60 tablet    Refill:  2    Order Specific Question:   Supervising Provider    Answer:   Caryl Pina A [id number]  . glimepiride (AMARYL) 4 MG tablet    Sig: Take 1 tablet (4 mg total) by mouth daily before breakfast.    Dispense:  90 tablet    Refill:  1    Note dose change- cancel previous    Order Specific Question:   Supervising Provider    Answer:   Caryl Pina A [id number]  . metFORMIN (GLUCOPHAGE) 1000 MG tablet    Sig: Take 1 tablet (1,000 mg total) by mouth 2 (two) times daily with a meal.    Dispense:  180 tablet    Refill:  1    Order Specific Question:   Supervising Provider    Answer:   Caryl Pina A [[id number]]  . insulin detemir (LEVEMIR FLEXTOUCH) 100 UNIT/ML FlexPen    Sig: Inject 20 Units into the skin daily.    Dispense:  15 mL    Refill:  11    Order Specific Question:   Supervising Provider    Answer:   Caryl Pina A [id number]    Labs pending Health Maintenance reviewed Diet and exercise encouraged  Follow up plan: 3 month follow up.   Mary-Margaret Hassell Done, FNP

## 2019-12-04 LAB — CMP14+EGFR
ALT: 18 IU/L (ref 0–44)
AST: 17 IU/L (ref 0–40)
Albumin/Globulin Ratio: 1.6 (ref 1.2–2.2)
Albumin: 4.1 g/dL (ref 3.8–4.8)
Alkaline Phosphatase: 93 IU/L (ref 48–121)
BUN/Creatinine Ratio: 11 (ref 10–24)
BUN: 30 mg/dL — ABNORMAL HIGH (ref 8–27)
Bilirubin Total: 0.2 mg/dL (ref 0.0–1.2)
CO2: 23 mmol/L (ref 20–29)
Calcium: 9.1 mg/dL (ref 8.6–10.2)
Chloride: 103 mmol/L (ref 96–106)
Creatinine, Ser: 2.68 mg/dL — ABNORMAL HIGH (ref 0.76–1.27)
GFR calc Af Amer: 28 mL/min/{1.73_m2} — ABNORMAL LOW (ref >59)
GFR calc non Af Amer: 24 mL/min/{1.73_m2} — ABNORMAL LOW (ref >59)
Globulin, Total: 2.5 g/dL (ref 1.5–4.5)
Glucose: 314 mg/dL — ABNORMAL HIGH (ref 65–99)
Potassium: 5.4 mmol/L — ABNORMAL HIGH (ref 3.5–5.2)
Sodium: 140 mmol/L (ref 134–144)
Total Protein: 6.6 g/dL (ref 6.0–8.5)

## 2019-12-04 LAB — CBC WITH DIFFERENTIAL/PLATELET
Basophils Absolute: 0.1 10*3/uL (ref 0.0–0.2)
Basos: 1 %
EOS (ABSOLUTE): 0.1 10*3/uL (ref 0.0–0.4)
Eos: 1 %
Hematocrit: 29.4 % — ABNORMAL LOW (ref 37.5–51.0)
Hemoglobin: 9.6 g/dL — ABNORMAL LOW (ref 13.0–17.7)
Immature Grans (Abs): 0 10*3/uL (ref 0.0–0.1)
Immature Granulocytes: 0 %
Lymphocytes Absolute: 1.5 10*3/uL (ref 0.7–3.1)
Lymphs: 19 %
MCH: 26.4 pg — ABNORMAL LOW (ref 26.6–33.0)
MCHC: 32.7 g/dL (ref 31.5–35.7)
MCV: 81 fL (ref 79–97)
Monocytes Absolute: 0.4 10*3/uL (ref 0.1–0.9)
Monocytes: 5 %
Neutrophils Absolute: 5.6 10*3/uL (ref 1.4–7.0)
Neutrophils: 74 %
Platelets: 219 10*3/uL (ref 150–450)
RBC: 3.64 x10E6/uL — ABNORMAL LOW (ref 4.14–5.80)
RDW: 14.1 % (ref 11.6–15.4)
WBC: 7.6 10*3/uL (ref 3.4–10.8)

## 2019-12-04 LAB — LIPID PANEL
Chol/HDL Ratio: 5.2 ratio — ABNORMAL HIGH (ref 0.0–5.0)
Cholesterol, Total: 193 mg/dL (ref 100–199)
HDL: 37 mg/dL — ABNORMAL LOW (ref >39)
LDL Chol Calc (NIH): 125 mg/dL — ABNORMAL HIGH (ref 0–99)
Triglycerides: 172 mg/dL — ABNORMAL HIGH (ref 0–149)
VLDL Cholesterol Cal: 31 mg/dL (ref 5–40)

## 2019-12-10 ENCOUNTER — Other Ambulatory Visit: Payer: Self-pay | Admitting: *Deleted

## 2019-12-10 DIAGNOSIS — E875 Hyperkalemia: Secondary | ICD-10-CM

## 2019-12-10 NOTE — Addendum Note (Signed)
Addended by: Chevis Pretty on: 12/10/2019 02:03 PM   Modules accepted: Orders

## 2020-03-07 ENCOUNTER — Ambulatory Visit (INDEPENDENT_AMBULATORY_CARE_PROVIDER_SITE_OTHER): Payer: BC Managed Care – PPO | Admitting: Nurse Practitioner

## 2020-03-07 ENCOUNTER — Other Ambulatory Visit: Payer: Self-pay

## 2020-03-07 ENCOUNTER — Encounter: Payer: Self-pay | Admitting: Nurse Practitioner

## 2020-03-07 VITALS — BP 154/75 | HR 85 | Temp 97.9°F | Resp 20 | Ht 73.0 in | Wt 191.0 lb

## 2020-03-07 DIAGNOSIS — I120 Hypertensive chronic kidney disease with stage 5 chronic kidney disease or end stage renal disease: Secondary | ICD-10-CM

## 2020-03-07 DIAGNOSIS — I10 Essential (primary) hypertension: Secondary | ICD-10-CM

## 2020-03-07 DIAGNOSIS — E119 Type 2 diabetes mellitus without complications: Secondary | ICD-10-CM

## 2020-03-07 DIAGNOSIS — E875 Hyperkalemia: Secondary | ICD-10-CM

## 2020-03-07 DIAGNOSIS — E785 Hyperlipidemia, unspecified: Secondary | ICD-10-CM | POA: Diagnosis not present

## 2020-03-07 DIAGNOSIS — K8500 Idiopathic acute pancreatitis without necrosis or infection: Secondary | ICD-10-CM

## 2020-03-07 DIAGNOSIS — N185 Chronic kidney disease, stage 5: Secondary | ICD-10-CM

## 2020-03-07 DIAGNOSIS — D649 Anemia, unspecified: Secondary | ICD-10-CM

## 2020-03-07 DIAGNOSIS — K5900 Constipation, unspecified: Secondary | ICD-10-CM

## 2020-03-07 DIAGNOSIS — K219 Gastro-esophageal reflux disease without esophagitis: Secondary | ICD-10-CM

## 2020-03-07 LAB — BAYER DCA HB A1C WAIVED: HB A1C (BAYER DCA - WAIVED): 8.3 % — ABNORMAL HIGH (ref <7.0)

## 2020-03-07 NOTE — Progress Notes (Signed)
Subjective:    Patient ID: Philip Richardson, male    DOB: 1956/04/08, 64 y.o.   MRN: [id number]   Chief Complaint: Medical Management of Chronic Issues    HPI:  1. Essential hypertension No c/o chest pain, sob or headache. Does not check blood pressure at home. He was told to stop lisinoril but he did not stop it. BP Readings from Last 3 Encounters:  03/07/20 (!) 154/75  12/03/19 138/76  10/09/19 (!) 155/84     2. Type 2 diabetes mellitus without complication, without long-term current use of insulin (HCC) fasting blood sugars are running around 135-140. He deneis any low readings. He was suppose to stop metformin due  To his creatine. He has not seen nephrologist- only a urologist. Lab Results  Component Value Date   HGBA1C 8.9 (H) 12/03/2019     3. Hyperlipidemia with target LDL less than 100 does not watch diet and does little to no exercise.  Lab Results  Component Value Date   CHOL 193 12/03/2019   HDL 37 (L) 12/03/2019   LDLCALC 125 (H) 12/03/2019   LDLDIRECT 113 (H) 04/08/2018   TRIG 172 (H) 12/03/2019   CHOLHDL 5.2 (H) 12/03/2019     4. Hyperkalemia Patient was suppose to come in and have potasssium repeated but he did not do. He denies any lower ext cramping. Lab Results  Component Value Date   K 5.4 (H) 12/03/2019    5. Gastroesophageal reflux disease, unspecified whether esophagitis present Is on protonix daily and is doing well.  6. Idiopathic acute pancreatitis without infection or necrosis Denies nay abdominal pain  7. Normocytic anemia He takes a daily iron supplement Lab Results  Component Value Date   HGB 9.6 (L) 12/03/2019     8. Constipation, unspecified constipation type Has daily.    Outpatient Encounter Medications as of 03/07/2020  Medication Sig  . amLODipine (NORVASC) 10 MG tablet Take 1 tablet (10 mg total) by mouth daily.  Marland Kitchen glimepiride (AMARYL) 4 MG tablet Take 1 tablet (4 mg total) by mouth daily before breakfast.  .  insulin detemir (LEVEMIR FLEXTOUCH) 100 UNIT/ML FlexPen Inject 20 Units into the skin daily.  . Insulin Pen Needle (PEN NEEDLES) 31G X 8 MM MISC Use to inject insulin daily as prescribed  . lisinopril-hydrochlorothiazide (ZESTORETIC) 20-12.5 MG tablet Take 2 tablets by mouth daily.  . Multiple Vitamin (MULTIVITAMIN WITH MINERALS) TABS tablet Take 1 tablet by mouth daily.  . pantoprazole (PROTONIX) 40 MG tablet TAKE (1) TABLET TWICE A DAY BEFORE MEALS.    Past Surgical History:  Procedure Laterality Date  . CYSTOSCOPY W/ URETERAL STENT PLACEMENT Bilateral 12/09/2013   Procedure: CYSTOSCOPY WITH RETROGRADE PYELOGRAM/URETERAL STENT PLACEMENT;  Surgeon: Alexis Frock, MD;  Location: [facility];  Service: Urology;  Laterality: Bilateral;  . CYSTOSCOPY WITH RETROGRADE PYELOGRAM, URETEROSCOPY AND STENT PLACEMENT Bilateral 09/30/2013   Procedure: CYSTOSCOPY WITH BILATERAL RETROGRADE PYELOGRAM, LEFT DIAGNOSTIC URETEROSCOPY AND Left ureteral stent;  Surgeon: Alexis Frock, MD;  Location: [facility];  Service: Urology;  Laterality: Bilateral;  . PERCUTANEOUS NEPHROLITHOTRIPSY  2005  . ROBOT ASSISTED PYELOPLASTY N/A 12/09/2013   Procedure: ROBOTIC ASSISTED BILATERAL PYELOLITHOTOMY, RIGHT  PYELOPLASTY ;  Surgeon: Alexis Frock, MD;  Location: [facility];  Service: Urology;  Laterality: N/A;    Family History  Problem Relation Age of Onset  . Cancer Mother   . Colon cancer Neg Hx   . Pancreatitis Neg Hx     New complaints: An appointment was made on  AUgust 16 to see nephrologist but he says he did not know anything about it.   Social history: Lives with his girlfriend  Controlled substance contract: n/a    Review of Systems  Constitutional: Negative for diaphoresis.  Eyes: Negative for pain.  Respiratory: Negative for shortness of breath.   Cardiovascular: Negative for chest pain, palpitations and leg swelling.  Gastrointestinal: Negative for abdominal pain.  Endocrine: Negative for  polydipsia.  Skin: Negative for rash.  Neurological: Negative for dizziness, weakness and headaches.  Hematological: Does not bruise/bleed easily.  All other systems reviewed and are negative.      Objective:   Physical Exam Vitals and nursing note reviewed.  Constitutional:      Appearance: Normal appearance. He is well-developed.  HENT:     Head: Normocephalic.     Nose: Nose normal.  Eyes:     Pupils: Pupils are equal, round, and reactive to light.  Neck:     Thyroid: No thyroid mass or thyromegaly.     Vascular: No carotid bruit or JVD.     Trachea: Phonation normal.  Cardiovascular:     Rate and Rhythm: Normal rate and regular rhythm.  Pulmonary:     Effort: Pulmonary effort is normal. No respiratory distress.     Breath sounds: Normal breath sounds.  Abdominal:     General: Bowel sounds are normal.     Palpations: Abdomen is soft.     Tenderness: There is no abdominal tenderness.  Musculoskeletal:        General: Normal range of motion.     Cervical back: Normal range of motion and neck supple.  Lymphadenopathy:     Cervical: No cervical adenopathy.  Skin:    General: Skin is warm and dry.  Neurological:     Mental Status: He is alert and oriented to person, place, and time.  Psychiatric:        Behavior: Behavior normal.        Thought Content: Thought content normal.        Judgment: Judgment normal.     BP (!) 154/75   Pulse 85   Temp 97.9 F (36.6 C) (Temporal)   Resp 20   Ht $R'6\' 1"'un$  (1.854 m)   Wt 191 lb (86.6 kg)   SpO2 100%   BMI 25.20 kg/m   Hgba1c discussed at appointment 83%      Assessment & Plan:  Philip Richardson comes in today with chief complaint of Medical Management of Chronic Issues   Diagnosis and orders addressed:  1. Essential hypertension Hold lisinopril until I get labs back continue amlodipine - CBC with Differential/Platelet - CMP14+EGFR  2. Type 2 diabetes mellitus without complication, without long-term current use  of insulin (HCC) Hold metformin until labs are back Continue glimipiride and levemir - Bayer DCA Hb A1c Waived - Microalbumin / creatinine urine ratio  3. Hyperlipidemia with target LDL less than 100 refuses cholesterol meds - Lipid panel  4. Hyperkalemia Labs pending  5. Gastroesophageal reflux disease, unspecified whether esophagitis present Continue protonix daily Avoid spicy foods Do not eat 2 hours prior to bedtime  6. Idiopathic acute pancreatitis without infection or necrosis  7. Normocytic anemia Labs pendoing contniue daily iron suplement  8. Constipation, unspecified constipation type increas fiber in diet  9. Hypertensive kidney disease with CKD (chronic kidney disease) stage V ([place]) Labs oending - Ambulatory referral to Nephrology   Labs pending Health Maintenance reviewed Diet and exercise encouraged  Follow up plan:  3 months   [place], FNP

## 2020-03-08 LAB — CBC WITH DIFFERENTIAL/PLATELET
Basophils Absolute: 0.1 10*3/uL (ref 0.0–0.2)
Basos: 1 %
EOS (ABSOLUTE): 0.1 10*3/uL (ref 0.0–0.4)
Eos: 2 %
Hematocrit: 28.8 % — ABNORMAL LOW (ref 37.5–51.0)
Hemoglobin: 9.1 g/dL — ABNORMAL LOW (ref 13.0–17.7)
Immature Grans (Abs): 0 10*3/uL (ref 0.0–0.1)
Immature Granulocytes: 0 %
Lymphocytes Absolute: 1.3 10*3/uL (ref 0.7–3.1)
Lymphs: 21 %
MCH: 27.2 pg (ref 26.6–33.0)
MCHC: 31.6 g/dL (ref 31.5–35.7)
MCV: 86 fL (ref 79–97)
Monocytes Absolute: 0.4 10*3/uL (ref 0.1–0.9)
Monocytes: 6 %
Neutrophils Absolute: 4.4 10*3/uL (ref 1.4–7.0)
Neutrophils: 70 %
Platelets: 235 10*3/uL (ref 150–450)
RBC: 3.35 x10E6/uL — ABNORMAL LOW (ref 4.14–5.80)
RDW: 14.5 % (ref 11.6–15.4)
WBC: 6.2 10*3/uL (ref 3.4–10.8)

## 2020-03-08 LAB — CMP14+EGFR
ALT: 14 IU/L (ref 0–44)
AST: 17 IU/L (ref 0–40)
Albumin/Globulin Ratio: 2 (ref 1.2–2.2)
Albumin: 4.3 g/dL (ref 3.8–4.8)
Alkaline Phosphatase: 89 IU/L (ref 44–121)
BUN/Creatinine Ratio: 9 — ABNORMAL LOW (ref 10–24)
BUN: 22 mg/dL (ref 8–27)
Bilirubin Total: <0.2 mg/dL (ref 0.0–1.2)
CO2: 21 mmol/L (ref 20–29)
Calcium: 8.9 mg/dL (ref 8.6–10.2)
Chloride: 98 mmol/L (ref 96–106)
Creatinine, Ser: 2.34 mg/dL — ABNORMAL HIGH (ref 0.76–1.27)
GFR calc Af Amer: 33 mL/min/{1.73_m2} — ABNORMAL LOW (ref >59)
GFR calc non Af Amer: 29 mL/min/{1.73_m2} — ABNORMAL LOW (ref >59)
Globulin, Total: 2.1 g/dL (ref 1.5–4.5)
Glucose: 341 mg/dL — ABNORMAL HIGH (ref 65–99)
Potassium: 5 mmol/L (ref 3.5–5.2)
Sodium: 136 mmol/L (ref 134–144)
Total Protein: 6.4 g/dL (ref 6.0–8.5)

## 2020-03-08 LAB — LIPID PANEL
Chol/HDL Ratio: 6.1 ratio — ABNORMAL HIGH (ref 0.0–5.0)
Cholesterol, Total: 213 mg/dL — ABNORMAL HIGH (ref 100–199)
HDL: 35 mg/dL — ABNORMAL LOW (ref >39)
LDL Chol Calc (NIH): 148 mg/dL — ABNORMAL HIGH (ref 0–99)
Triglycerides: 164 mg/dL — ABNORMAL HIGH (ref 0–149)
VLDL Cholesterol Cal: 30 mg/dL (ref 5–40)

## 2020-03-08 MED ORDER — ATORVASTATIN CALCIUM 40 MG PO TABS: 40.0000 mg | ORAL_TABLET | Freq: Every day | ORAL | 1 refills | Status: DC

## 2020-03-08 MED ORDER — CLONIDINE HCL 0.1 MG PO TABS: 0.1000 mg | ORAL_TABLET | Freq: Two times a day (BID) | ORAL | 3 refills | Status: DC

## 2020-03-08 NOTE — Addendum Note (Signed)
Addended by: Chevis Pretty on: 03/08/2020 09:54 AM   Modules accepted: Orders

## 2020-03-09 ENCOUNTER — Telehealth: Payer: Self-pay | Admitting: Nurse Practitioner

## 2020-03-11 ENCOUNTER — Ambulatory Visit: Payer: Self-pay | Admitting: Pharmacist

## 2020-03-14 ENCOUNTER — Other Ambulatory Visit: Payer: Self-pay

## 2020-03-14 ENCOUNTER — Ambulatory Visit (INDEPENDENT_AMBULATORY_CARE_PROVIDER_SITE_OTHER): Payer: BC Managed Care – PPO | Admitting: Pharmacist

## 2020-03-14 DIAGNOSIS — E119 Type 2 diabetes mellitus without complications: Secondary | ICD-10-CM

## 2020-03-14 DIAGNOSIS — G720 Drug-induced myopathy: Secondary | ICD-10-CM

## 2020-03-14 LAB — HM DIABETES EYE EXAM

## 2020-03-14 MED ORDER — ONETOUCH DELICA LANCETS 30G MISC: 3 refills | Status: DC

## 2020-03-14 MED ORDER — DAPAGLIFLOZIN PROPANEDIOL 10 MG PO TABS: 10.0000 mg | ORAL_TABLET | Freq: Every day | ORAL | 3 refills | Status: DC

## 2020-03-14 MED ORDER — ONETOUCH VERIO FLEX SYSTEM W/DEVICE KIT: PACK | 0 refills | Status: DC

## 2020-03-14 MED ORDER — ONETOUCH VERIO VI STRP: ORAL_STRIP | 12 refills | Status: DC

## 2020-03-14 NOTE — Progress Notes (Signed)
° ° °  03/14/2020 Name: Philip Richardson MRN: [id number] DOB: 1955-09-09   S:  72 yoM Presents for diabetes evaluation, education, and management Patient was referred and last seen by Primary Care Provider on 03/07/20.  Insurance coverage/medication affordability: BCBS commercial  Patient reports adherence with medications.  Current diabetes medications include: levemir, glimepiride  Current hypertension medications include: amlodipine, clonidine Goal 130/80  Current hyperlipidemia medications include: atorvastatin (NOT TAKING) documented statin induced myopathy. Patient is taking cholesterol supplement OTC, but does not recall the name.   Patient denies hypoglycemic events.  Patient-reported exercise habits: n/a  Patient denies nocturia (nighttime urination).    O:  Lab Results  Component Value Date   HGBA1C 8.3 (H) 03/07/2020   Lipid Panel     Component Value Date/Time   CHOL 213 (H) 03/07/2020 1133   TRIG 164 (H) 03/07/2020 1133   TRIG 100 04/21/2014 1455   HDL 35 (L) 03/07/2020 1133   HDL 46 04/21/2014 1455   CHOLHDL 6.1 (H) 03/07/2020 1133   LDLCALC 148 (H) 03/07/2020 1133   LDLCALC 103 (H) 01/13/2014 1342   LDLDIRECT 113 (H) 04/08/2018 1622     Home fasting blood sugars: reports 140, FBG was 300 in office (patient missed dose of insulin yesterday)  2 hour post-meal/random blood sugars: n/a.     A/P:  Diabetes T2DM currently uncontrolled. Patient is MOSTLY adherent with medication. Control is suboptimal due to missed doses.  -One touch verio meter called in; patient's meter is broken at home  -Continued basal insulin Levemir (insulin glargine) 20 units for now.  Will likely change to [place] for better all day coverage  -Started SGLT2-I FARXIGA (generic name dapagliflozin) 10mg  for CKD and DM; samples given: [id number], EXP 4/24 Counseled Sick day rules: if sick, vomiting, have diarrhea, or  cannot drink enough fluids, you should stop taking SGLT-2  inhibitors until your symptoms go away. In rare cases, these medicines can cause diabetic ketoacidosis (DKA). DKA is acid buildup in the blood.  -Not taking statin due to myopathy/weakness; will address at next visit; ICD10 G72 for drug induced myopathy  -Extensively discussed pathophysiology of diabetes, recommended lifestyle interventions, dietary effects on blood sugar control  -Counseled on s/sx of and management of hypoglycemia  -Next A1C anticipated 06/2019.  Written patient instructions provided.  Total time in face to face counseling 30 minutes.   Follow up PCP Clinic Visit ON 06/2019.    Regina Eck, PharmD, BCPS Clinical Pharmacist, [place]  II Phone [phone]

## 2020-03-31 ENCOUNTER — Telehealth: Payer: Self-pay | Admitting: Nurse Practitioner

## 2020-03-31 DIAGNOSIS — N170 Acute kidney failure with tubular necrosis: Secondary | ICD-10-CM | POA: Diagnosis not present

## 2020-03-31 DIAGNOSIS — N189 Chronic kidney disease, unspecified: Secondary | ICD-10-CM | POA: Diagnosis not present

## 2020-03-31 DIAGNOSIS — I129 Hypertensive chronic kidney disease with stage 1 through stage 4 chronic kidney disease, or unspecified chronic kidney disease: Secondary | ICD-10-CM | POA: Diagnosis not present

## 2020-03-31 DIAGNOSIS — D638 Anemia in other chronic diseases classified elsewhere: Secondary | ICD-10-CM | POA: Diagnosis not present

## 2020-03-31 NOTE — Telephone Encounter (Signed)
Thayer Headings was calling on behalf of pt to discuss medications

## 2020-04-01 NOTE — Telephone Encounter (Signed)
Patient needs to stop farxiga- appointment with clinical pharmacist to discuss diabetes- please call patient and make appointment

## 2020-04-01 NOTE — Telephone Encounter (Signed)
When I spoke with Thayer Headings this morning she stated that Dr. Theador Hawthorne wants to talk to MMM directly about patient.

## 2020-04-01 NOTE — Telephone Encounter (Signed)
Dr. Theador Hawthorne wants to talk to MMM about patient -Please call

## 2020-04-04 NOTE — Telephone Encounter (Signed)
I have already spoke with him and we agreed to stop farxiga until he can see Almyra Free about his diabetes.

## 2020-04-05 ENCOUNTER — Other Ambulatory Visit: Payer: BC Managed Care – PPO

## 2020-04-05 ENCOUNTER — Other Ambulatory Visit: Payer: Self-pay

## 2020-04-05 DIAGNOSIS — D509 Iron deficiency anemia, unspecified: Secondary | ICD-10-CM | POA: Diagnosis not present

## 2020-04-05 DIAGNOSIS — R809 Proteinuria, unspecified: Secondary | ICD-10-CM | POA: Diagnosis not present

## 2020-04-05 DIAGNOSIS — Z1159 Encounter for screening for other viral diseases: Secondary | ICD-10-CM | POA: Diagnosis not present

## 2020-04-05 DIAGNOSIS — E559 Vitamin D deficiency, unspecified: Secondary | ICD-10-CM | POA: Diagnosis not present

## 2020-04-12 ENCOUNTER — Ambulatory Visit: Payer: BC Managed Care – PPO | Admitting: Gastroenterology

## 2020-04-12 ENCOUNTER — Other Ambulatory Visit: Payer: Self-pay

## 2020-04-12 ENCOUNTER — Encounter: Payer: Self-pay | Admitting: Internal Medicine

## 2020-04-12 ENCOUNTER — Encounter: Payer: Self-pay | Admitting: Gastroenterology

## 2020-04-12 VITALS — BP 173/89 | HR 109 | Temp 97.1°F | Ht 73.0 in | Wt 180.8 lb

## 2020-04-12 DIAGNOSIS — R1310 Dysphagia, unspecified: Secondary | ICD-10-CM | POA: Diagnosis not present

## 2020-04-12 DIAGNOSIS — D649 Anemia, unspecified: Secondary | ICD-10-CM

## 2020-04-12 NOTE — H&P (View-Only) (Signed)
Referring Provider: Chevis Pretty, * Primary Care Physician:  Chevis Pretty, FNP Primary GI: Dr. Gala Romney  Chief Complaint  Patient presents with  . Gastroesophageal Reflux    doing okay  . Constipation    comes/goes    HPI:   Philip Richardson is a 64 y.o. male presenting today with a history of idiopathic pancreatitis in Feb 2021, MRCP for dedicated pancreatic evaluation with resolving inflammatory changes and no mass. Unable to rule out microlithiasis; I do note he had slight bump in transaminases and alk phos but now resolved. No gallstones.   Colonoscopy: no prior. Cologuard Sept 2019 was negative. EGD possibly 25-30 years ago and was told he had a hiatal hernia. Declined colonoscopy historically. Started on iron by PCP.   Protonix BID. No breakthrough GERD. Occasional hiccups. Sometimes when eating, starts to cough and gag. If drinks, can get it down. Feels like food gets hung up in his throat. Feels like he has a lot of sinus drainage.   Constipation with iron. No straining. Feels like not having the best BM.   Chronic normocytic anemia in setting of CKD. Ferritin 122 in Nov 2021. PCP has him on iron.    Past Medical History:  Diagnosis Date  . Frequency of urination   . GERD (gastroesophageal reflux disease)   . Horseshoe kidney    BILATERAL  . Hypertension   . Renal calculus, bilateral   . Type 2 diabetes mellitus ([place])   . Urgency of urination   . Wears dentures     Past Surgical History:  Procedure Laterality Date  . CYSTOSCOPY W/ URETERAL STENT PLACEMENT Bilateral 12/09/2013   Procedure: CYSTOSCOPY WITH RETROGRADE PYELOGRAM/URETERAL STENT PLACEMENT;  Surgeon: Alexis Frock, MD;  Location: [facility];  Service: Urology;  Laterality: Bilateral;  . CYSTOSCOPY WITH RETROGRADE PYELOGRAM, URETEROSCOPY AND STENT PLACEMENT Bilateral 09/30/2013   Procedure: CYSTOSCOPY WITH BILATERAL RETROGRADE PYELOGRAM, LEFT DIAGNOSTIC URETEROSCOPY AND Left ureteral stent;   Surgeon: Alexis Frock, MD;  [facility];  Service: Urology;  Laterality: Bilateral;  . PERCUTANEOUS NEPHROLITHOTRIPSY  2005  . ROBOT ASSISTED PYELOPLASTY N/A 12/09/2013   Procedure: ROBOTIC ASSISTED BILATERAL PYELOLITHOTOMY, RIGHT  PYELOPLASTY ;  Surgeon: Alexis Frock, MD;  Location: [facility];  Service: Urology;  Laterality: N/A;    Current Outpatient Medications  Medication Sig Dispense Refill  . Blood Glucose Monitoring Suppl ([place]) w/Device KIT Use to test blood sugar twice daily. DX E11.9 1 kit 0  . cloNIDine (CATAPRES) 0.1 MG tablet Take 1 tablet (0.1 mg total) by mouth 2 (two) times daily. (Patient taking differently: Take 0.1 mg by mouth 3 (three) times daily. ) 60 tablet 3  . dapagliflozin propanediol (FARXIGA) 10 MG TABS tablet Take 1 tablet (10 mg total) by mouth daily. 30 tablet 3  . glimepiride (AMARYL) 4 MG tablet Take 1 tablet (4 mg total) by mouth daily before breakfast. 90 tablet 1  . glucose blood (ONETOUCH VERIO) test strip Use to test blood sugar twice daily. DX E11.9 100 each 12  . Insulin Degludec (TRESIBA) 100 UNIT/ML SOLN Inject into the skin. Once a day    . insulin detemir (LEVEMIR FLEXTOUCH) 100 UNIT/ML FlexPen Inject 20 Units into the skin daily. 15 mL 11  . Insulin Pen Needle (PEN NEEDLES) 31G X 8 MM MISC Use to inject insulin daily as prescribed 150 each 1  . Multiple Vitamin (MULTIVITAMIN WITH MINERALS) TABS tablet Take 1 tablet by mouth daily.    Glory Rosebush  Delica Lancets 94T MISC Use to test blood sugar twice daily. DX E11.9 100 each 3  . pantoprazole (PROTONIX) 40 MG tablet TAKE (1) TABLET TWICE A DAY BEFORE MEALS. 60 tablet 2  . amLODipine (NORVASC) 10 MG tablet Take 1 tablet (10 mg total) by mouth daily. (Patient not taking: Reported on 04/12/2020) 90 tablet 1   No current facility-administered medications for this visit.    Allergies as of 04/12/2020 - Review Complete 04/12/2020  Allergen Reaction Noted  .  Atorvastatin  03/14/2020  . Invokana [canagliflozin] Other (See Comments) 11/29/2015    Family History  Problem Relation Age of Onset  . Cancer Mother   . Colon cancer Neg Hx   . Pancreatitis Neg Hx     Social History   Socioeconomic History  . Marital status: Single    Spouse name: Not on file  . Number of children: Not on file  . Years of education: Not on file  . Highest education level: Not on file  Occupational History  . Not on file  Tobacco Use  . Smoking status: Never Smoker  . Smokeless tobacco: Never Used  Vaping Use  . Vaping Use: Never used  Substance and Sexual Activity  . Alcohol use: No    Comment: no history of etoh use  . Drug use: No  . Sexual activity: Not on file  Other Topics Concern  . Not on file  Social History Narrative  . Not on file   Social Determinants of Health   Financial Resource Strain:   . Difficulty of Paying Living Expenses: Not on file  Food Insecurity:   . Worried About Charity fundraiser in the Last Year: Not on file  . Ran Out of Food in the Last Year: Not on file  Transportation Needs:   . Lack of Transportation (Medical): Not on file  . Lack of Transportation (Non-Medical): Not on file  Physical Activity:   . Days of Exercise per Week: Not on file  . Minutes of Exercise per Session: Not on file  Stress:   . Feeling of Stress : Not on file  Social Connections:   . Frequency of Communication with Friends and Family: Not on file  . Frequency of Social Gatherings with Friends and Family: Not on file  . Attends Religious Services: Not on file  . Active Member of Clubs or Organizations: Not on file  . Attends Archivist Meetings: Not on file  . Marital Status: Not on file    Review of Systems: Gen: Denies fever, chills, anorexia. Denies fatigue, weakness, weight loss.  CV: Denies chest pain, palpitations, syncope, peripheral edema, and claudication. Resp: Denies dyspnea at rest, cough, wheezing, coughing up  blood, and pleurisy. GI: see HPI Derm: Denies rash, itching, dry skin Psych: Denies depression, anxiety, memory loss, confusion. No homicidal or suicidal ideation.  Heme: Denies bruising, bleeding, and enlarged lymph nodes.  Physical Exam: BP (!) 173/89   Pulse (!) 109   Temp (!) 97.1 F (36.2 C)   Ht _0  (1.854 m)   Wt 180 lb 12.8 oz (82 kg)   BMI 23.85 kg/m  General:   Alert and oriented. No distress noted. Pleasant and cooperative.  Head:  Normocephalic and atraumatic. Eyes:  Conjuctiva clear without scleral icterus. Mouth:  Mask in place Cardiac: S1 S2 present without murmurs Lungs: clear bilaterally Abdomen:  +BS, soft, non-tender and non-distended. No rebound or guarding. No HSM or masses noted. Msk:  Symmetrical without  gross deformities. Normal posture. Extremities:  Without edema. Neurologic:  Alert and  oriented x4 Psych:  Alert and cooperative. Normal mood and affect.  ASSESSMENT: Philip Richardson is a 64 y.o. male presenting today with history of acute pancreatitis earlier this year that was felt to be idiopathic, unable to rule out microlithiasis, no gallstones on imaging, and follow-up dedicated MRI of pancreas unrevealing.   GERD: continues with Protonix BID but now noting solid food dysphagia. Last EGD 25-30 years ago. Discussed EGD/dilation in near future.  Normocytic anemia: in setting of CKD. Ferritin 122 in Nov 2021 through Nephrology. PCP has placed him on iron. No prior colonoscopy. Needs initial screening in near future. Does not appear to have an iron deficiency component.   Patient's primary GI is Dr. Gala Romney but with scheduling issues and in setting of dysphagia and need for EGD/dilation, will pursue with Dr Abbey Chatters in next few weeks.    PLAN:  Proceed with colonoscopy/EGD/dilation by Dr. Abbey Chatters  in near future: the risks, benefits, and alternatives have been discussed with the patient in detail. The patient states understanding and desires to proceed.    Stop iron 7 days  Benefiber daily  Follow-up thereafter   Annitta Needs, PhD, ANP-BC [facility]

## 2020-04-12 NOTE — Patient Instructions (Signed)
We are arranging a colonoscopy, upper endoscopy, and possible dilation in the near future. Do not take any of your oral diabetes medication or insulin the morning of the procedure. Please stop iron 7 days prior.   For mild constipation: take Benefiber 2 teaspoons daily and you can increase as tolerated.  We will see you in follow-up thereafter!  I enjoyed seeing you again today! As you know, I value our relationship and want to provide genuine, compassionate, and quality care. I welcome your feedback. If you receive a survey regarding your visit,  I greatly appreciate you taking time to fill this out. See you next time!  Annitta Needs, PhD, ANP-BC [facility]

## 2020-04-12 NOTE — Progress Notes (Signed)
  Referring Provider: Martin, Mary-Margaret, * Primary Care Physician:  Martin, Mary-Margaret, FNP Primary GI: Dr. Rourk  Chief Complaint  Patient presents with  . Gastroesophageal Reflux    doing okay  . Constipation    comes/goes    HPI:   Philip Richardson is a 64 y.o. male presenting today with a history of idiopathic pancreatitis in Feb 2021, MRCP for dedicated pancreatic evaluation with resolving inflammatory changes and no mass. Unable to rule out microlithiasis; I do note he had slight bump in transaminases and alk phos but now resolved. No gallstones.   Colonoscopy: no prior. Cologuard Sept 2019 was negative. EGD possibly 25-30 years ago and was told he had a hiatal hernia. Declined colonoscopy historically. Started on iron by PCP.   Protonix BID. No breakthrough GERD. Occasional hiccups. Sometimes when eating, starts to cough and gag. If drinks, can get it down. Feels like food gets hung up in his throat. Feels like he has a lot of sinus drainage.   Constipation with iron. No straining. Feels like not having the best BM.   Chronic normocytic anemia in setting of CKD. Ferritin 122 in Nov 2021. PCP has him on iron.    Past Medical History:  Diagnosis Date  . Frequency of urination   . GERD (gastroesophageal reflux disease)   . Horseshoe kidney    BILATERAL  . Hypertension   . Renal calculus, bilateral   . Type 2 diabetes mellitus ([facility])   . Urgency of urination   . Wears dentures     Past Surgical History:  Procedure Laterality Date  . CYSTOSCOPY W/ URETERAL STENT PLACEMENT Bilateral 12/09/2013   Procedure: CYSTOSCOPY WITH RETROGRADE PYELOGRAM/URETERAL STENT PLACEMENT;  Surgeon: Theodore Manny, MD;  Location: [facility];  Service: Urology;  Laterality: Bilateral;  . CYSTOSCOPY WITH RETROGRADE PYELOGRAM, URETEROSCOPY AND STENT PLACEMENT Bilateral 09/30/2013   Procedure: CYSTOSCOPY WITH BILATERAL RETROGRADE PYELOGRAM, LEFT DIAGNOSTIC URETEROSCOPY AND Left ureteral stent;   Surgeon: Theodore Manny, MD;  Location: [facility];  Service: Urology;  Laterality: Bilateral;  . PERCUTANEOUS NEPHROLITHOTRIPSY  2005  . ROBOT ASSISTED PYELOPLASTY N/A 12/09/2013   Procedure: ROBOTIC ASSISTED BILATERAL PYELOLITHOTOMY, RIGHT  PYELOPLASTY ;  Surgeon: Theodore Manny, MD;  Location: [facility];  Service: Urology;  Laterality: N/A;    Current Outpatient Medications  Medication Sig Dispense Refill  . Blood Glucose Monitoring Suppl (ONETOUCH VERIO FLEX SYSTEM) w/Device KIT Use to test blood sugar twice daily. DX E11.9 1 kit 0  . cloNIDine (CATAPRES) 0.1 MG tablet Take 1 tablet (0.1 mg total) by mouth 2 (two) times daily. (Patient taking differently: Take 0.1 mg by mouth 3 (three) times daily. ) 60 tablet 3  . dapagliflozin propanediol (FARXIGA) 10 MG TABS tablet Take 1 tablet (10 mg total) by mouth daily. 30 tablet 3  . glimepiride (AMARYL) 4 MG tablet Take 1 tablet (4 mg total) by mouth daily before breakfast. 90 tablet 1  . glucose blood (ONETOUCH VERIO) test strip Use to test blood sugar twice daily. DX E11.9 100 each 12  . Insulin Degludec (TRESIBA) 100 UNIT/ML SOLN Inject into the skin. Once a day    . insulin detemir (LEVEMIR FLEXTOUCH) 100 UNIT/ML FlexPen Inject 20 Units into the skin daily. 15 mL 11  . Insulin Pen Needle (PEN NEEDLES) 31G X 8 MM MISC Use to inject insulin daily as prescribed 150 each 1  . Multiple Vitamin (MULTIVITAMIN WITH MINERALS) TABS tablet Take 1 tablet by mouth daily.    . OneTouch   Delica Lancets 30G MISC Use to test blood sugar twice daily. DX E11.9 100 each 3  . pantoprazole (PROTONIX) 40 MG tablet TAKE (1) TABLET TWICE A DAY BEFORE MEALS. 60 tablet 2  . amLODipine (NORVASC) 10 MG tablet Take 1 tablet (10 mg total) by mouth daily. (Patient not taking: Reported on 04/12/2020) 90 tablet 1   No current facility-administered medications for this visit.    Allergies as of 04/12/2020 - Review Complete 04/12/2020  Allergen Reaction Noted  .  Atorvastatin  03/14/2020  . Invokana [canagliflozin] Other (See Comments) 11/29/2015    Family History  Problem Relation Age of Onset  . Cancer Mother   . Colon cancer Neg Hx   . Pancreatitis Neg Hx     Social History   Socioeconomic History  . Marital status: Single    Spouse name: Not on file  . Number of children: Not on file  . Years of education: Not on file  . Highest education level: Not on file  Occupational History  . Not on file  Tobacco Use  . Smoking status: Never Smoker  . Smokeless tobacco: Never Used  Vaping Use  . Vaping Use: Never used  Substance and Sexual Activity  . Alcohol use: No    Comment: no history of etoh use  . Drug use: No  . Sexual activity: Not on file  Other Topics Concern  . Not on file  Social History Narrative  . Not on file   Social Determinants of Health   Financial Resource Strain:   . Difficulty of Paying Living Expenses: Not on file  Food Insecurity:   . Worried About Running Out of Food in the Last Year: Not on file  . Ran Out of Food in the Last Year: Not on file  Transportation Needs:   . Lack of Transportation (Medical): Not on file  . Lack of Transportation (Non-Medical): Not on file  Physical Activity:   . Days of Exercise per Week: Not on file  . Minutes of Exercise per Session: Not on file  Stress:   . Feeling of Stress : Not on file  Social Connections:   . Frequency of Communication with Friends and Family: Not on file  . Frequency of Social Gatherings with Friends and Family: Not on file  . Attends Religious Services: Not on file  . Active Member of Clubs or Organizations: Not on file  . Attends Club or Organization Meetings: Not on file  . Marital Status: Not on file    Review of Systems: Gen: Denies fever, chills, anorexia. Denies fatigue, weakness, weight loss.  CV: Denies chest pain, palpitations, syncope, peripheral edema, and claudication. Resp: Denies dyspnea at rest, cough, wheezing, coughing up  blood, and pleurisy. GI: see HPI Derm: Denies rash, itching, dry skin Psych: Denies depression, anxiety, memory loss, confusion. No homicidal or suicidal ideation.  Heme: Denies bruising, bleeding, and enlarged lymph nodes.  Physical Exam: BP (!) 173/89   Pulse (!) 109   Temp (!) 97.1 F (36.2 C)   Ht 6' 1" (1.854 m)   Wt 180 lb 12.8 oz (82 kg)   BMI 23.85 kg/m  General:   Alert and oriented. No distress noted. Pleasant and cooperative.  Head:  Normocephalic and atraumatic. Eyes:  Conjuctiva clear without scleral icterus. Mouth:  Mask in place Cardiac: S1 S2 present without murmurs Lungs: clear bilaterally Abdomen:  +BS, soft, non-tender and non-distended. No rebound or guarding. No HSM or masses noted. Msk:  Symmetrical without   gross deformities. Normal posture. Extremities:  Without edema. Neurologic:  Alert and  oriented x4 Psych:  Alert and cooperative. Normal mood and affect.  ASSESSMENT: Philip Richardson is a 64 y.o. male presenting today with history of acute pancreatitis earlier this year that was felt to be idiopathic, unable to rule out microlithiasis, no gallstones on imaging, and follow-up dedicated MRI of pancreas unrevealing.   GERD: continues with Protonix BID but now noting solid food dysphagia. Last EGD 25-30 years ago. Discussed EGD/dilation in near future.  Normocytic anemia: in setting of CKD. Ferritin 122 in Nov 2021 through Nephrology. PCP has placed him on iron. No prior colonoscopy. Needs initial screening in near future. Does not appear to have an iron deficiency component.   Patient's primary GI is Dr. Rourk but with scheduling issues and in setting of dysphagia and need for EGD/dilation, will pursue with Dr Carver in next few weeks.    PLAN:  Proceed with colonoscopy/EGD/dilation by Dr. Carver  in near future: the risks, benefits, and alternatives have been discussed with the patient in detail. The patient states understanding and desires to proceed.    Stop iron 7 days  Benefiber daily  Follow-up thereafter   Philip Meadow W. Rozalia Dino, PhD, ANP-BC [facility]     

## 2020-04-13 NOTE — Progress Notes (Signed)
Cc'ed to pcp °

## 2020-04-19 ENCOUNTER — Encounter (HOSPITAL_COMMUNITY): Payer: Self-pay

## 2020-04-22 ENCOUNTER — Other Ambulatory Visit (HOSPITAL_COMMUNITY)
Admission: RE | Admit: 2020-04-22 | Discharge: 2020-04-22 | Disposition: A | Discharge status: Home / Self Care | Payer: BC Managed Care – PPO | Source: Ambulatory Visit | Attending: Internal Medicine | Admitting: Internal Medicine

## 2020-04-22 ENCOUNTER — Other Ambulatory Visit: Payer: Self-pay

## 2020-04-22 DIAGNOSIS — Z79899 Other long term (current) drug therapy: Secondary | ICD-10-CM | POA: Diagnosis not present

## 2020-04-22 DIAGNOSIS — Z20822 Contact with and (suspected) exposure to covid-19: Secondary | ICD-10-CM | POA: Insufficient documentation

## 2020-04-22 DIAGNOSIS — K648 Other hemorrhoids: Secondary | ICD-10-CM | POA: Diagnosis not present

## 2020-04-22 DIAGNOSIS — D124 Benign neoplasm of descending colon: Secondary | ICD-10-CM | POA: Diagnosis not present

## 2020-04-22 DIAGNOSIS — Z794 Long term (current) use of insulin: Secondary | ICD-10-CM | POA: Diagnosis not present

## 2020-04-22 DIAGNOSIS — D509 Iron deficiency anemia, unspecified: Secondary | ICD-10-CM | POA: Diagnosis not present

## 2020-04-22 DIAGNOSIS — B3781 Candidal esophagitis: Secondary | ICD-10-CM | POA: Diagnosis not present

## 2020-04-22 DIAGNOSIS — Z888 Allergy status to other drugs, medicaments and biological substances status: Secondary | ICD-10-CM | POA: Diagnosis not present

## 2020-04-22 DIAGNOSIS — R1310 Dysphagia, unspecified: Secondary | ICD-10-CM | POA: Diagnosis not present

## 2020-04-22 DIAGNOSIS — Z01812 Encounter for preprocedural laboratory examination: Secondary | ICD-10-CM | POA: Insufficient documentation

## 2020-04-22 DIAGNOSIS — K319 Disease of stomach and duodenum, unspecified: Secondary | ICD-10-CM | POA: Diagnosis not present

## 2020-04-22 DIAGNOSIS — K2970 Gastritis, unspecified, without bleeding: Secondary | ICD-10-CM | POA: Diagnosis not present

## 2020-04-23 LAB — SARS CORONAVIRUS 2 (TAT 6-24 HRS): SARS Coronavirus 2: NEGATIVE

## 2020-04-25 ENCOUNTER — Ambulatory Visit (HOSPITAL_COMMUNITY): Payer: BC Managed Care – PPO | Admitting: Anesthesiology

## 2020-04-25 ENCOUNTER — Ambulatory Visit (HOSPITAL_COMMUNITY)
Admission: RE | Admit: 2020-04-25 | Discharge: 2020-04-25 | Disposition: A | Discharge status: Home / Self Care | Payer: BC Managed Care – PPO | Attending: Internal Medicine | Admitting: Internal Medicine

## 2020-04-25 ENCOUNTER — Encounter (HOSPITAL_COMMUNITY)
Admission: RE | Disposition: A | Discharge status: Home / Self Care | Payer: Self-pay | Source: Home / Self Care | Attending: Internal Medicine

## 2020-04-25 ENCOUNTER — Encounter (HOSPITAL_COMMUNITY): Payer: Self-pay | Admitting: *Deleted

## 2020-04-25 ENCOUNTER — Other Ambulatory Visit: Payer: Self-pay

## 2020-04-25 DIAGNOSIS — Z79899 Other long term (current) drug therapy: Secondary | ICD-10-CM | POA: Insufficient documentation

## 2020-04-25 DIAGNOSIS — K319 Disease of stomach and duodenum, unspecified: Secondary | ICD-10-CM | POA: Insufficient documentation

## 2020-04-25 DIAGNOSIS — D124 Benign neoplasm of descending colon: Secondary | ICD-10-CM | POA: Diagnosis not present

## 2020-04-25 DIAGNOSIS — Z888 Allergy status to other drugs, medicaments and biological substances status: Secondary | ICD-10-CM | POA: Diagnosis not present

## 2020-04-25 DIAGNOSIS — K648 Other hemorrhoids: Secondary | ICD-10-CM | POA: Insufficient documentation

## 2020-04-25 DIAGNOSIS — K2970 Gastritis, unspecified, without bleeding: Secondary | ICD-10-CM

## 2020-04-25 DIAGNOSIS — R1310 Dysphagia, unspecified: Secondary | ICD-10-CM

## 2020-04-25 DIAGNOSIS — Z20822 Contact with and (suspected) exposure to covid-19: Secondary | ICD-10-CM | POA: Diagnosis not present

## 2020-04-25 DIAGNOSIS — B3781 Candidal esophagitis: Secondary | ICD-10-CM

## 2020-04-25 DIAGNOSIS — K2289 Other specified disease of esophagus: Secondary | ICD-10-CM | POA: Diagnosis not present

## 2020-04-25 DIAGNOSIS — K3189 Other diseases of stomach and duodenum: Secondary | ICD-10-CM | POA: Diagnosis not present

## 2020-04-25 DIAGNOSIS — D509 Iron deficiency anemia, unspecified: Secondary | ICD-10-CM | POA: Insufficient documentation

## 2020-04-25 DIAGNOSIS — D649 Anemia, unspecified: Secondary | ICD-10-CM | POA: Diagnosis not present

## 2020-04-25 DIAGNOSIS — K635 Polyp of colon: Secondary | ICD-10-CM

## 2020-04-25 DIAGNOSIS — Z794 Long term (current) use of insulin: Secondary | ICD-10-CM | POA: Diagnosis not present

## 2020-04-25 HISTORY — PX: COLONOSCOPY WITH PROPOFOL: SHX5780

## 2020-04-25 HISTORY — PX: BIOPSY: SHX5522

## 2020-04-25 HISTORY — PX: ESOPHAGOGASTRODUODENOSCOPY (EGD) WITH PROPOFOL: SHX5813

## 2020-04-25 HISTORY — PX: POLYPECTOMY: SHX5525

## 2020-04-25 LAB — KOH PREP

## 2020-04-25 LAB — GLUCOSE, CAPILLARY: Glucose-Capillary: 250 mg/dL — ABNORMAL HIGH (ref 70–99)

## 2020-04-25 SURGERY — COLONOSCOPY WITH PROPOFOL
Anesthesia: General

## 2020-04-25 MED ORDER — GLYCOPYRROLATE 0.2 MG/ML IJ SOLN: 0.2000 mg | Freq: Once | INTRAMUSCULAR | Status: AC

## 2020-04-25 MED ORDER — LACTATED RINGERS IV SOLN: Freq: Once | INTRAVENOUS | Status: AC

## 2020-04-25 MED ORDER — LACTATED RINGERS IV SOLN: INTRAVENOUS | Status: DC | PRN

## 2020-04-25 MED ORDER — LIDOCAINE VISCOUS HCL 2 % MT SOLN: 15.0000 mL | Freq: Once | OROMUCOSAL | Status: AC

## 2020-04-25 MED ORDER — PROPOFOL 10 MG/ML IV BOLUS
INTRAVENOUS | Status: DC | PRN
  Administered 2020-04-25: 100 mg via INTRAVENOUS
  Administered 2020-04-25: 150 ug/kg/min via INTRAVENOUS

## 2020-04-25 MED ORDER — LIDOCAINE VISCOUS HCL 2 % MT SOLN
OROMUCOSAL | Status: AC
  Administered 2020-04-25: 15 mL via OROMUCOSAL
  Filled 2020-04-25: qty 15

## 2020-04-25 MED ORDER — GLYCOPYRROLATE 0.2 MG/ML IJ SOLN
INTRAMUSCULAR | Status: AC
  Administered 2020-04-25: 0.2 mg via INTRAVENOUS
  Filled 2020-04-25: qty 1

## 2020-04-25 NOTE — Transfer of Care (Signed)
Immediate Anesthesia Transfer of Care Note  Patient: Philip Richardson  Procedure(s) Performed: COLONOSCOPY WITH PROPOFOL (N/A ) ESOPHAGOGASTRODUODENOSCOPY (EGD) WITH PROPOFOL (N/A ) BIOPSY POLYPECTOMY  Patient Location: Endoscopy Unit  Anesthesia Type:General  Level of Consciousness: awake, alert , oriented and patient cooperative  Airway & Oxygen Therapy: Patient Spontanous Breathing  Post-op Assessment: Report given to RN, Post -op Vital signs reviewed and stable and Patient moving all extremities  Post vital signs: Reviewed and stable  Last Vitals:  Vitals Value Taken Time  BP    Temp    Pulse    Resp    SpO2      Last Pain:  Vitals:   04/25/20 1006  TempSrc:   PainSc: 0-No pain      Patients Stated Pain Goal: 9 (32/25/67 2091)  Complications: No complications documented.

## 2020-04-25 NOTE — Op Note (Signed)
[facility] Patient Name: Philip Richardson Procedure Date: 04/25/2020 9:57 AM MRN: [id number] Date of Birth: [birth year] Attending MD: Elon Alas. Abbey Chatters DO CSN: [id number] Age: 64 Admit Type: Outpatient Procedure:                Colonoscopy Indications:              Iron deficiency anemia Providers:                Elon Alas. Liam Bossman, DO, [place] B. Sharon Seller, RN,                            Nelma Rothman, Technician Referring MD:              Medicines:                See the Anesthesia note for documentation of the                            administered medications Complications:            No immediate complications. Estimated Blood Loss:     Estimated blood loss was minimal. Procedure:                Pre-Anesthesia Assessment:                           - The anesthesia plan was to use monitored                            anesthesia care (MAC).                           After obtaining informed consent, the colonoscope                            was passed under direct vision. Throughout the                            procedure, the patient's blood pressure, pulse, and                            oxygen saturations were monitored continuously. The                            [id number]-[id number] ([id number]) scope was introduced through                            the anus and advanced to the the cecum, identified                            by appendiceal orifice and ileocecal valve. The                            colonoscopy was performed without difficulty. The                            patient tolerated the procedure well. The  quality                            of the bowel preparation was evaluated using the                            BBPS [facility] Bowel Preparation Scale) with scores                            of: Right Colon = 3, Transverse Colon = 3 and Left                            Colon = 3 (entire mucosa seen well with no residual                            staining, small fragments of  stool or opaque                            liquid). The total BBPS score equals 9. Scope In: 10:17:40 AM Scope Out: 10:28:08 AM Scope Withdrawal Time: 0 hours 7 minutes 51 seconds  Total Procedure Duration: 0 hours 10 minutes 28 seconds  Findings:      The perianal and digital rectal examinations were normal.      Non-bleeding internal hemorrhoids were found during endoscopy.      A 5 mm polyp was found in the descending colon. The polyp was sessile.       The polyp was removed with a cold snare. Resection and retrieval were       complete.      The exam was otherwise without abnormality. Impression:               - Non-bleeding internal hemorrhoids.                           - One 5 mm polyp in the descending colon, removed                            with a cold snare. Resected and retrieved.                           - The examination was otherwise normal. Moderate Sedation:      Per Anesthesia Care Recommendation:           - Patient has a contact number available for                            emergencies. The signs and symptoms of potential                            delayed complications were discussed with the                            patient. Return to normal activities tomorrow.  Written discharge instructions were provided to the                            patient.                           - Resume previous diet.                           - Continue present medications.                           - Await pathology results.                           - Repeat colonoscopy in 5 years for surveillance.                           - Return to GI clinic in 2 months. Procedure Code(s):        --- Professional ---                           [phone], Colonoscopy, flexible; with removal of                            tumor(s), polyp(s), or other lesion(s) by snare                            technique Diagnosis Code(s):        --- Professional ---                            K63.5, Polyp of colon                           K64.8, Other hemorrhoids                           D50.9, Iron deficiency anemia, unspecified CPT copyright 2019 American Medical Association. All rights reserved. The codes documented in this report are preliminary and upon coder review may  be revised to meet current compliance requirements. Elon Alas. Abbey Chatters, DO [place] Abbey Chatters, DO 04/25/2020 10:29:43 AM This report has been signed electronically. Number of Addenda: 0

## 2020-04-25 NOTE — Interval H&P Note (Signed)
History and Physical Interval Note:  04/25/2020 9:39 AM  Philip Richardson  has presented today for surgery, with the diagnosis of anemia, dysphagia.  The various methods of treatment have been discussed with the patient and family. After consideration of risks, benefits and other options for treatment, the patient has consented to  Procedure(s) with comments: COLONOSCOPY WITH PROPOFOL (N/A) - 10:15am ESOPHAGOGASTRODUODENOSCOPY (EGD) WITH PROPOFOL (N/A) BALLOON DILATION (N/A) as a surgical intervention.  The patient's history has been reviewed, patient examined, no change in status, stable for surgery.  I have reviewed the patient's chart and labs.  Questions were answered to the patient's satisfaction.     [name]

## 2020-04-25 NOTE — Discharge Instructions (Addendum)
Colon Polyps  Polyps are tissue growths inside the body. Polyps can grow in many places, including the large intestine (colon). A polyp may be a round bump or a mushroom-shaped growth. You could have one polyp or several. Most colon polyps are noncancerous (benign). However, some colon polyps can become cancerous over time. Finding and removing the polyps early can help prevent this. What are the causes? The exact cause of colon polyps is not known. What increases the risk? You are more likely to develop this condition if you:  Have a family history of colon cancer or colon polyps.  Are older than 55 or older than 45 if you are African American.  Have inflammatory bowel disease, such as ulcerative colitis or Crohn's disease.  Have certain hereditary conditions, such as: ? Familial adenomatous polyposis. ? Lynch syndrome. ? Turcot syndrome. ? Peutz-Jeghers syndrome.  Are overweight.  Smoke cigarettes.  Do not get enough exercise.  Drink too much alcohol.  Eat a diet that is high in fat and red meat and low in fiber.  Had childhood cancer that was treated with abdominal radiation. What are the signs or symptoms? Most polyps do not cause symptoms. If you have symptoms, they may include:  Blood coming from your rectum when having a bowel movement.  Blood in your stool. The stool may look dark red or black.  Abdominal pain.  A change in bowel habits, such as constipation or diarrhea. How is this diagnosed? This condition is diagnosed with a colonoscopy. This is a procedure in which a lighted, flexible scope is inserted into the anus and then passed into the colon to examine the area. Polyps are sometimes found when a colonoscopy is done as part of routine cancer screening tests. How is this treated? Treatment for this condition involves removing any polyps that are found. Most polyps can be removed during a colonoscopy. Those polyps will then be tested for cancer. Additional  treatment may be needed depending on the results of testing. Follow these instructions at home: Lifestyle  Maintain a healthy weight, or lose weight if recommended by your health care provider.  Exercise every day or as told by your health care provider.  Do not use any products that contain nicotine or tobacco, such as cigarettes and e-cigarettes. If you need help quitting, ask your health care provider.  If you drink alcohol, limit how much you have: ? 0-1 drink a day for women. ? 0-2 drinks a day for men.  Be aware of how much alcohol is in your drink. In the [place] one drink equals one 12 oz bottle of beer (355 mL), one 5 oz glass of wine (148 mL), or one 1 oz shot of hard liquor (44 mL). Eating and drinking   Eat foods that are high in fiber, such as fruits, vegetables, and whole grains.  Eat foods that are high in calcium and vitamin D, such as milk, cheese, yogurt, eggs, liver, fish, and broccoli.  Limit foods that are high in fat, such as fried foods and desserts.  Limit the amount of red meat and processed meat you eat, such as hot dogs, sausage, bacon, and lunch meats. General instructions  Keep all follow-up visits as told by your health care provider. This is important. ? This includes having regularly scheduled colonoscopies. ? Talk to your health care provider about when you need a colonoscopy. Contact a health care provider if:  You have new or worsening bleeding during a bowel movement.  You  have new or increased blood in your stool.  You have a change in bowel habits.  You lose weight for no known reason. Summary  Polyps are tissue growths inside the body. Polyps can grow in many places, including the colon.  Most colon polyps are noncancerous (benign), but some can become cancerous over time.  This condition is diagnosed with a colonoscopy.  Treatment for this condition involves removing any polyps that are found. Most polyps can be removed during a  colonoscopy. This information is not intended to replace advice given to you by your health care provider. Make sure you discuss any questions you have with your health care provider. Document Revised: 09/05/2017 Document Reviewed: 09/05/2017 Elsevier Patient Education  [place]. EGD Discharge instructions Please read the instructions outlined below and refer to this sheet in the next few weeks. These discharge instructions provide you with general information on caring for yourself after you leave the hospital. Your doctor may also give you specific instructions. While your treatment has been planned according to the most current medical practices available, unavoidable complications occasionally occur. If you have any problems or questions after discharge, please call your doctor. ACTIVITY  You may resume your regular activity but move at a slower pace for the next 24 hours.   Take frequent rest periods for the next 24 hours.   Walking will help expel (get rid of) the air and reduce the bloated feeling in your abdomen.   No driving for 24 hours (because of the anesthesia (medicine) used during the test).   You may shower.   Do not sign any important legal documents or operate any machinery for 24 hours (because of the anesthesia used during the test).  NUTRITION  Drink plenty of fluids.   You may resume your normal diet.   Begin with a light meal and progress to your normal diet.   Avoid alcoholic beverages for 24 hours or as instructed by your caregiver.  MEDICATIONS  You may resume your normal medications unless your caregiver tells you otherwise.  WHAT YOU CAN EXPECT TODAY  You may experience abdominal discomfort such as a feeling of fullness or "gas" pains.  FOLLOW-UP  Your doctor will discuss the results of your test with you.  SEEK IMMEDIATE MEDICAL ATTENTION IF ANY OF THE FOLLOWING OCCUR:  Excessive nausea (feeling sick to your stomach) and/or vomiting.    Severe abdominal pain and distention (swelling).   Trouble swallowing.   Temperature over 101 F (37.8 C).   Rectal bleeding or vomiting of blood.    Colonoscopy Discharge Instructions  Read the instructions outlined below and refer to this sheet in the next few weeks. These discharge instructions provide you with general information on caring for yourself after you leave the hospital. Your doctor may also give you specific instructions. While your treatment has been planned according to the most current medical practices available, unavoidable complications occasionally occur.   ACTIVITY  You may resume your regular activity, but move at a slower pace for the next 24 hours.   Take frequent rest periods for the next 24 hours.   Walking will help get rid of the air and reduce the bloated feeling in your belly (abdomen).   No driving for 24 hours (because of the medicine (anesthesia) used during the test).    Do not sign any important legal documents or operate any machinery for 24 hours (because of the anesthesia used during the test).  NUTRITION  Drink plenty  of fluids.   You may resume your normal diet as instructed by your doctor.   Begin with a light meal and progress to your normal diet. Heavy or fried foods are harder to digest and may make you feel sick to your stomach (nauseated).   Avoid alcoholic beverages for 24 hours or as instructed.  MEDICATIONS  You may resume your normal medications unless your doctor tells you otherwise.  WHAT YOU CAN EXPECT TODAY  Some feelings of bloating in the abdomen.   Passage of more gas than usual.   Spotting of blood in your stool or on the toilet paper.  IF YOU HAD POLYPS REMOVED DURING THE COLONOSCOPY:  No aspirin products for 7 days or as instructed.   No alcohol for 7 days or as instructed.   Eat a soft diet for the next 24 hours.  FINDING OUT THE RESULTS OF YOUR TEST Not all test results are available during your  visit. If your test results are not back during the visit, make an appointment with your caregiver to find out the results. Do not assume everything is normal if you have not heard from your caregiver or the medical facility. It is important for you to follow up on all of your test results.  SEEK IMMEDIATE MEDICAL ATTENTION IF:  You have more than a spotting of blood in your stool.   Your belly is swollen (abdominal distention).   You are nauseated or vomiting.   You have a temperature over 101.   You have abdominal pain or discomfort that is severe or gets worse throughout the day.   Your EGD showed inflammation in your esophagus and stomach.  I biopsied your stomach to rule out infection with bacteria called H. pylori.  You may have a fungal infection in your esophagus.  Depending on biopsy results, we may need to treat this with a 14-day course of antifungals.  Await pathology results, my office will contact you.  Continue on pantoprazole twice daily.  Avoid NSAIDs.  Your colonoscopy was relatively unremarkable besides 1 polyp which I removed successfully.  I would recommend we repeat this in 5 years for surveillance purposes.  Follow-up with Roseanne Kaufman in 2 to 3 months.  I hope you have a great rest of your week!  Elon Alas. Abbey Chatters, D.O. Gastroenterology and Hepatology [facility]

## 2020-04-25 NOTE — Op Note (Signed)
[facility] Patient Name: Philip Richardson Procedure Date: 04/25/2020 10:04 AM MRN: [id number] Date of Birth: [birth year] Attending MD: Elon Alas. Abbey Chatters DO CSN: [id number] Age: 64 Admit Type: Outpatient Procedure:                Upper GI endoscopy Indications:              Dysphagia Providers:                Elon Alas. Ever Halberg, DO, Otis Peak B. Sharon Seller, RN,                            Nelma Rothman, Technician Referring MD:              Medicines:                See the Anesthesia note for documentation of the                            administered medications Complications:            No immediate complications. Estimated Blood Loss:     Estimated blood loss was minimal. Procedure:                Pre-Anesthesia Assessment:                           - The anesthesia plan was to use monitored                            anesthesia care (MAC).                           After obtaining informed consent, the endoscope was                            passed under direct vision. Throughout the                            procedure, the patient's blood pressure, pulse, and                            oxygen saturations were monitored continuously. The                            [id number]-H190 ([id number]) scope was introduced through the                            mouth, and advanced to the second part of duodenum.                            The upper GI endoscopy was accomplished without                            difficulty. The patient tolerated the procedure                            well. Scope In: 10:07:16 AM  Scope Out: 10:12:09 AM Total Procedure Duration: 0 hours 4 minutes 53 seconds  Findings:      Mildly severe esophagitis with no bleeding was found in the middle third       of the esophagus. Cells for cytology were obtained by brushing.      Mucosal changes including ringed esophagus were found in the middle       third of the esophagus. Esophageal findings were graded using the        Eosinophilic Esophagitis Endoscopic Reference Score (EoE-EREFS) as:       Edema Grade 0 Normal (distinct vascular markings), Rings Grade 2       Moderate (distinct rings that do not occlude passage of diagnostic 8-10       mm endoscope), Exudates Grade 1 Mild (scattered white lesions involving       less than 10 percent of the esophageal surface area), Furrows Grade 0       None (no vertical lines seen) and Stricture none (no stricture found).       Biopsies were taken with a cold forceps for histology.      Localized mild inflammation characterized by erythema was found in the       gastric antrum. Biopsies were taken with a cold forceps for Helicobacter       pylori testing.      The duodenal bulb, first portion of the duodenum and second portion of       the duodenum were normal. Biopsies for histology were taken with a cold       forceps for evaluation of celiac disease. Impression:               - Mildly severe candidiasis esophagitis with no                            bleeding. Cells for cytology obtained.                           - Esophageal mucosal changes suspicious for                            eosinophilic esophagitis. Biopsied.                           - Gastritis. Biopsied.                           - Normal duodenal bulb, first portion of the                            duodenum and second portion of the duodenum.                            Biopsied. Moderate Sedation:      Per Anesthesia Care Recommendation:           - Patient has a contact number available for                            emergencies. The signs and symptoms of potential  delayed complications were discussed with the                            patient. Return to normal activities tomorrow.                            Written discharge instructions were provided to the                            patient.                           - Resume previous diet.                            - Continue present medications.                           - Await pathology results.                           - Return to GI clinic in 2 months. Procedure Code(s):        --- Professional ---                           [phone], Esophagogastroduodenoscopy, flexible,                            transoral; with biopsy, single or multiple Diagnosis Code(s):        --- Professional ---                           B37.81, Candidal esophagitis                           K22.8, Other specified diseases of esophagus                           K29.70, Gastritis, unspecified, without bleeding                           R13.10, Dysphagia, unspecified CPT copyright 2019 American Medical Association. All rights reserved. The codes documented in this report are preliminary and upon coder review may  be revised to meet current compliance requirements. Elon Alas. Abbey Chatters, DO [place] Abbey Chatters, DO 04/25/2020 10:16:03 AM This report has been signed electronically. Number of Addenda: 0

## 2020-04-25 NOTE — Anesthesia Preprocedure Evaluation (Addendum)
Anesthesia Evaluation  Patient identified by MRN, date of birth, ID band Patient awake    Reviewed: Allergy & Precautions, NPO status , Patient's Chart, lab work & pertinent test results  History of Anesthesia Complications Negative for: history of anesthetic complications  Airway Mallampati: II  TM Distance: >3 FB Neck ROM: Full    Dental  (+) Dental Advisory Given, Upper Dentures, Lower Dentures   Pulmonary neg pulmonary ROS,    Pulmonary exam normal breath sounds clear to auscultation       Cardiovascular Exercise Tolerance: Good hypertension, Pt. on medications Normal cardiovascular exam Rhythm:Regular Rate:Normal     Neuro/Psych  Neuromuscular disease negative psych ROS   GI/Hepatic GERD  Medicated,Elevated LFTs   Endo/Other  diabetes, Well Controlled, Type 2, Oral Hypoglycemic Agents, Insulin Dependent  Renal/GU Renal InsufficiencyRenal disease (creatinine 2.34)  negative genitourinary   Musculoskeletal negative musculoskeletal ROS (+)   Abdominal   Peds  Hematology  (+) anemia ,   Anesthesia Other Findings   Reproductive/Obstetrics negative OB ROS                            Anesthesia Physical Anesthesia Plan  ASA: II  Anesthesia Plan: General   Post-op Pain Management:    Induction: Intravenous  PONV Risk Score and Plan:   Airway Management Planned: Nasal Cannula and Natural Airway  Additional Equipment:   Intra-op Plan:   Post-operative Plan:   Informed Consent: I have reviewed the patients History and Physical, chart, labs and discussed the procedure including the risks, benefits and alternatives for the proposed anesthesia with the patient or authorized representative who has indicated his/her understanding and acceptance.     Dental advisory given  Plan Discussed with: CRNA and Surgeon  Anesthesia Plan Comments:         Anesthesia Quick  Evaluation

## 2020-04-25 NOTE — Anesthesia Postprocedure Evaluation (Signed)
Anesthesia Post Note  Patient: Software engineer  Procedure(s) Performed: COLONOSCOPY WITH PROPOFOL (N/A ) ESOPHAGOGASTRODUODENOSCOPY (EGD) WITH PROPOFOL (N/A ) BIOPSY POLYPECTOMY  Patient location during evaluation: Endoscopy Anesthesia Type: General Level of consciousness: awake, oriented, awake and alert and patient cooperative Pain management: pain level controlled Vital Signs Assessment: post-procedure vital signs reviewed and stable Respiratory status: spontaneous breathing, nonlabored ventilation and respiratory function stable Cardiovascular status: blood pressure returned to baseline and stable Postop Assessment: no headache and no backache Anesthetic complications: no   No complications documented.   Last Vitals:  Vitals:   04/25/20 0916  BP: (!) 181/104  Pulse: (!) 109  Resp: 16  Temp: 37.1 C  SpO2: 99%    Last Pain:  Vitals:   04/25/20 1006  TempSrc:   PainSc: 0-No pain                 Tacy Learn

## 2020-04-26 ENCOUNTER — Other Ambulatory Visit: Payer: Self-pay

## 2020-04-27 ENCOUNTER — Other Ambulatory Visit: Payer: Self-pay

## 2020-04-27 LAB — SURGICAL PATHOLOGY

## 2020-05-02 ENCOUNTER — Encounter (HOSPITAL_COMMUNITY): Payer: Self-pay | Admitting: Internal Medicine

## 2020-05-05 ENCOUNTER — Telehealth: Payer: Self-pay | Admitting: Internal Medicine

## 2020-05-05 DIAGNOSIS — N189 Chronic kidney disease, unspecified: Secondary | ICD-10-CM | POA: Diagnosis not present

## 2020-05-05 DIAGNOSIS — I129 Hypertensive chronic kidney disease with stage 1 through stage 4 chronic kidney disease, or unspecified chronic kidney disease: Secondary | ICD-10-CM | POA: Diagnosis not present

## 2020-05-05 DIAGNOSIS — D638 Anemia in other chronic diseases classified elsewhere: Secondary | ICD-10-CM | POA: Diagnosis not present

## 2020-05-05 DIAGNOSIS — B3781 Candidal esophagitis: Secondary | ICD-10-CM

## 2020-05-05 DIAGNOSIS — Q631 Lobulated, fused and horseshoe kidney: Secondary | ICD-10-CM | POA: Diagnosis not present

## 2020-05-05 MED ORDER — FLUCONAZOLE 200 MG PO TABS: 200.0000 mg | ORAL_TABLET | Freq: Every day | ORAL | 0 refills | Status: AC

## 2020-05-05 NOTE — Telephone Encounter (Signed)
I have tried to call patient multiple times.  I finally left a voicemail today informing patient that it appears that he has candidal esophagitis.  I have sent in Diflucan x14 days to his pharmacy.  He will need to take 400 mg on day 1 and 200 mg daily thereafter.  He already has follow-up visit arranged.  Thank you

## 2020-05-05 NOTE — Telephone Encounter (Signed)
Noted  

## 2020-05-06 NOTE — Telephone Encounter (Signed)
Phoned and spoke to the pt. He did receive Dr. Ave Filter vm and has already picked up his Rx.

## 2020-06-09 ENCOUNTER — Ambulatory Visit: Payer: BC Managed Care – PPO | Admitting: Nurse Practitioner

## 2020-06-09 ENCOUNTER — Other Ambulatory Visit: Payer: Self-pay

## 2020-06-09 ENCOUNTER — Encounter: Payer: Self-pay | Admitting: Nurse Practitioner

## 2020-06-09 VITALS — BP 159/77 | HR 93 | Temp 98.4°F | Resp 20 | Ht 73.0 in | Wt 181.0 lb

## 2020-06-09 DIAGNOSIS — E119 Type 2 diabetes mellitus without complications: Secondary | ICD-10-CM | POA: Diagnosis not present

## 2020-06-09 DIAGNOSIS — E785 Hyperlipidemia, unspecified: Secondary | ICD-10-CM | POA: Diagnosis not present

## 2020-06-09 DIAGNOSIS — R1319 Other dysphagia: Secondary | ICD-10-CM

## 2020-06-09 DIAGNOSIS — I10 Essential (primary) hypertension: Secondary | ICD-10-CM

## 2020-06-09 DIAGNOSIS — K219 Gastro-esophageal reflux disease without esophagitis: Secondary | ICD-10-CM

## 2020-06-09 DIAGNOSIS — E875 Hyperkalemia: Secondary | ICD-10-CM | POA: Diagnosis not present

## 2020-06-09 DIAGNOSIS — R7989 Other specified abnormal findings of blood chemistry: Secondary | ICD-10-CM

## 2020-06-09 DIAGNOSIS — D649 Anemia, unspecified: Secondary | ICD-10-CM

## 2020-06-09 LAB — BAYER DCA HB A1C WAIVED: HB A1C (BAYER DCA - WAIVED): 12.6 % — ABNORMAL HIGH (ref <7.0)

## 2020-06-09 MED ORDER — CLONIDINE HCL 0.1 MG PO TABS: 0.1000 mg | ORAL_TABLET | Freq: Three times a day (TID) | ORAL | 1 refills | Status: DC

## 2020-06-09 MED ORDER — FERROUS SULFATE 325 (65 FE) MG PO TABS: 325.0000 mg | ORAL_TABLET | Freq: Every day | ORAL | 1 refills | Status: DC

## 2020-06-09 MED ORDER — PANTOPRAZOLE SODIUM 40 MG PO TBEC: 40.0000 mg | DELAYED_RELEASE_TABLET | Freq: Two times a day (BID) | ORAL | 1 refills | Status: DC

## 2020-06-09 MED ORDER — DAPAGLIFLOZIN PROPANEDIOL 10 MG PO TABS: 10.0000 mg | ORAL_TABLET | Freq: Every day | ORAL | 1 refills | Status: DC

## 2020-06-09 MED ORDER — AMLODIPINE BESYLATE 10 MG PO TABS: 10.0000 mg | ORAL_TABLET | Freq: Every day | ORAL | 1 refills | Status: DC

## 2020-06-09 MED ORDER — LEVEMIR FLEXTOUCH 100 UNIT/ML ~~LOC~~ SOPN: 30.0000 [IU] | PEN_INJECTOR | Freq: Every day | SUBCUTANEOUS | 11 refills | Status: DC

## 2020-06-09 MED ORDER — GLIMEPIRIDE 4 MG PO TABS: 4.0000 mg | ORAL_TABLET | Freq: Every day | ORAL | 1 refills | Status: DC

## 2020-06-09 NOTE — Patient Instructions (Signed)
Insulin Detemir injection What is this medicine? INSULIN DETEMIR (IN su lin DE te mir) is a human-made form of insulin. This drug lowers the amount of sugar in your blood. It is a long-acting insulin that is usually given once or twice a day. This medicine may be used for other purposes; ask your health care provider or pharmacist if you have questions. COMMON BRAND NAME(S): Levemir, Levemir FlexTouch What should I tell my health care provider before I take this medicine? They need to know if you have any of these conditions:  episodes of low blood sugar  eye disease, vision problems  kidney disease  liver disease  an unusual or allergic reaction to insulin, metacresol, other medicines, foods, dyes, or preservatives  pregnant or trying to get pregnant  breast-feeding How should I use this medicine? This medicine is for injection under the skin. Take this medicine at the same time(s) each day. Use exactly as directed. This insulin should never be mixed in the same syringe with other insulins before injection. Do not vigorously shake insulin before use. You will be taught how to adjust doses for activities and illness. Do not use more insulin than prescribed. Do not use more or less often than prescribed. Always check the appearance of your insulin before using it. This medicine should be clear and colorless like water. Do not use if it is cloudy, thickened, colored, or has solid particles in it. If you use a pen, be sure to take off the outer needle cover before using the dose. It is important that you put your used needles and syringes in a special sharps container. Do not put them in a trash can. If you do not have a sharps container, call your pharmacist or healthcare provider to get one. This drug comes with INSTRUCTIONS FOR USE. Ask your pharmacist for directions on how to use this drug. Read the information carefully. Talk to your pharmacist or health care provider if you have  questions. Talk to your pediatrician regarding the use of this medicine in children. While this drug may be prescribed for children as young as 2 years for selected conditions, precautions do apply. Overdosage: If you think you have taken too much of this medicine contact a poison control center or emergency room at once. NOTE: This medicine is only for you. Do not share this medicine with others. What if I miss a dose? It is important not to miss a dose. Your health care professional or doctor should discuss a plan for missed doses with you. If you do miss a dose, follow their plan. Do not take double doses. What may interact with this medicine?  other medicines for diabetes Many medications may cause changes in blood sugar, these include:  alcohol containing beverages  antiviral medicines for HIV or AIDS  aspirin and aspirin-like drugs  certain medicines for blood pressure, heart disease, irregular heart beat  chromium  diuretics  male hormones, such as estrogens or progestins, birth control pills  fenofibrate  gemfibrozil  isoniazid  lanreotide  male hormones or anabolic steroids  MAOIs like Carbex, Eldepryl, Marplan, Nardil, and Parnate  medicines for weight loss  medicines for allergies, asthma, cold, or cough  medicines for depression, anxiety, or psychotic disturbances  niacin  nicotine  NSAIDs, medicines for pain and inflammation, like ibuprofen or naproxen  octreotide  pasireotide  pentamidine  phenytoin  probenecid  quinolone antibiotics such as ciprofloxacin, levofloxacin, ofloxacin  some herbal dietary supplements  steroid medicines such as  prednisone or cortisone  sulfamethoxazole; trimethoprim  thyroid hormones Some medications can hide the warning symptoms of low blood sugar (hypoglycemia). You may need to monitor your blood sugar more closely if you are taking one of these medications. These include:  beta-blockers, often used  for high blood pressure or heart problems (examples include atenolol, metoprolol, propranolol)  clonidine  guanethidine  reserpine This list may not describe all possible interactions. Give your health care provider a list of all the medicines, herbs, non-prescription drugs, or dietary supplements you use. Also tell them if you smoke, drink alcohol, or use illegal drugs. Some items may interact with your medicine. What should I watch for while using this medicine? Visit your health care professional or doctor for regular checks on your progress. A test called the HbA1C (A1C) will be monitored. This is a simple blood test. It measures your blood sugar control over the last 2 to 3 months. You will receive this test every 3 to 6 months. Learn how to check your blood sugar. Learn the symptoms of low and high blood sugar and how to manage them. Always carry a quick-source of sugar with you in case you have symptoms of low blood sugar. Examples include hard sugar candy or glucose tablets. Make sure others know that you can choke if you eat or drink when you develop serious symptoms of low blood sugar, such as seizures or unconsciousness. They must get medical help at once. Tell your doctor or health care professional if you have high blood sugar. You might need to change the dose of your medicine. If you are sick or exercising more than usual, you might need to change the dose of your medicine. Do not skip meals. Ask your doctor or health care professional if you should avoid alcohol. Many nonprescription cough and cold products contain sugar or alcohol. These can affect blood sugar. Make sure that you have the right kind of syringe for the type of insulin you use. Try not to change the brand and type of insulin or syringe unless your health care professional or doctor tells you to. Switching insulin brand or type can cause dangerously high or low blood sugar. Always keep an extra supply of insulin,  syringes, and needles on hand. Use a syringe one time only. Throw away syringe and needle in a closed container to prevent accidental needle sticks. Insulin pens and cartridges should never be shared. Even if the needle is changed, sharing may result in passing of viruses like hepatitis or HIV. Each time you get a new box of pen needles, check to see if they are the same type as the ones you were trained to use. If not, ask your health care professional to show you how to use this new type properly. Wear a medical ID bracelet or chain, and carry a card that describes your disease and details of your medicine and dosage times. What side effects may I notice from receiving this medicine? Side effects that you should report to your doctor or health care professional as soon as possible:  allergic reactions like skin rash, itching or hives, swelling of the face, lips, or tongue  breathing problems  signs and symptoms of high blood sugar such as dizziness, dry mouth, dry skin, fruity breath, nausea, stomach pain, increased hunger or thirst, increased urination  signs and symptoms of low blood sugar such as feeling anxious, confusion, dizziness, increased hunger, unusually weak or tired, sweating, shakiness, cold, irritable, headache, blurred vision, fast  heartbeat, loss of consciousness Side effects that usually do not require medical attention (report to your doctor or health care professional if they continue or are bothersome):  increase or decrease in fatty tissue under the skin due to overuse of a particular injection site  itching, burning, swelling, or rash at site where injected This list may not describe all possible side effects. Call your doctor for medical advice about side effects. You may report side effects to FDA at 1-800-FDA-1088. Where should I keep my medicine? Keep out of the reach of children. Unopened Vials: Levemir vials: Store in a refrigerator between 2 and 8 degrees C (36  and 46 degrees F) or at room temperature below 30 degrees C (86 degrees F). Do not freeze or use if the insulin has been frozen. Protect from light and excessive heat. If stored at room temperature, the vial must be discarded after 42 days. Throw away any unopened and unused medicine that has been stored in the refrigerator after the expiration date. Unopened Pens: Levemir Flextouch pens: Store in a refrigerator between 2 and 8 degrees C (36 and 46 degrees F) or at room temperature below 30 degrees C (86 degrees F). Do not freeze or use if the insulin has been frozen. Protect from light and excessive heat. If stored at room temperature, the pen must be discarded after 42 days. Throw away any unopened and unused medicine that has been stored in the refrigerator after the expiration date. Vials that you are using: Levemir vials: Store in the refrigerator or at room temperature below 30 degrees C (86 degrees F). Do not freeze. Keep away from heat and light. Throw the opened vial away after 42 days. Pens that you are using: Levemir Flextouch pens: Store at room temperature, below 30 degrees C (86 degrees F). Do not store in the refrigerator once opened. Do not refrigerate or freeze. Keep away from heat and light. Throw the pen away after 42 days, even if it still has insulin left in it. NOTE: This sheet is a summary. It may not cover all possible information. If you have questions about this medicine, talk to your doctor, pharmacist, or health care provider.  2020 Elsevier/Gold Standard (2019-02-03 08:10:07)

## 2020-06-09 NOTE — Progress Notes (Signed)
Subjective:    Patient ID: Philip Richardson, male    DOB: [birth year], 65 y.o.   MRN: [id number]   Chief Complaint: Medical Management of Chronic Issues    HPI:  1. Type 2 diabetes mellitus without complication, without long-term current use of insulin (HCC) He has not been checking his blood sugars. He stopped drinking soft drinks and is now drinking unsweetened tea. He doe snot watch diet very closely. He is on levemir 20u daily. He fprgets it some days.  2. Hyperlipidemia with target LDL less than 100 Does try to watch diet , but does little to no dedicated exercise. Lab Results  Component Value Date   CHOL 213 (H) 03/07/2020   HDL 35 (L) 03/07/2020   LDLCALC 148 (H) 03/07/2020   LDLDIRECT 113 (H) 04/08/2018   TRIG 164 (H) 03/07/2020   CHOLHDL 6.1 (H) 03/07/2020   The 10-year ASCVD risk score Mikey Bussing DC Jr., et al., 2013) is: 39.2%   Values used to calculate the score:     Age: 63 years     Sex: Male     Is Non-Hispanic African American: No     Diabetic: Yes     Tobacco smoker: No     Systolic Blood Pressure: 035 mmHg     Is BP treated: Yes     HDL Cholesterol: 35 mg/dL     Total Cholesterol: 213 mg/dL   3. Primary hypertension No c/o chest pain, sob or headache. He does not check blood pressure at home. Nephrologist recently put him on clonidine 3x a day along with his amlodipine.  BP Readings from Last 3 Encounters:  04/25/20 110/78  04/12/20 (!) 173/89  03/07/20 (!) 154/75     4. Hyperkalemia No c/o of lower ext cramping. Lab Results  Component Value Date   K 5.0 03/07/2020     5. Elevated LFTs Lab Results  Component Value Date   ALT 14 03/07/2020   AST 17 03/07/2020   ALKPHOS 89 03/07/2020   BILITOT <0.2 03/07/2020     6. Esophageal dysphagia Has not had any  Problems swallowing recently.  7. Gastroesophageal reflux disease, unspecified whether esophagitis present Is on protonix daily and is doing well.  8. Normocytic anemia No c/o fatigue.  He was put on iron supplement at last visit. Lab Results  Component Value Date   HGB 9.1 (L) 03/07/2020    9. Renal diaease Has followup with nephrology tomorrow. Lab Results  Component Value Date   CREATININE 2.34 (H) 03/07/2020     Outpatient Encounter Medications as of 06/09/2020  Medication Sig  . acetaminophen (TYLENOL) 325 MG tablet Take 325-650 mg by mouth every 6 (six) hours as needed (for pain.).  Marland Kitchen amLODipine (NORVASC) 10 MG tablet Take 10 mg by mouth daily.  . Blood Glucose Monitoring Suppl (Clay) w/Device KIT Use to test blood sugar twice daily. DX E11.9  . Cholecalciferol (VITAMIN D3) 50 MCG (2000 UT) TABS Take 2,000 Units by mouth daily.  . cloNIDine (CATAPRES) 0.1 MG tablet Take 1 tablet (0.1 mg total) by mouth 2 (two) times daily. (Patient taking differently: Take 0.1 mg by mouth 3 (three) times daily. )  . dapagliflozin propanediol (FARXIGA) 10 MG TABS tablet Take 1 tablet (10 mg total) by mouth daily.  . ferrous sulfate 325 (65 FE) MG tablet Take 325 mg by mouth daily with breakfast.  . glimepiride (AMARYL) 4 MG tablet Take 1 tablet (4 mg total) by mouth daily before breakfast.  .  glucose blood (ONETOUCH VERIO) test strip Use to test blood sugar twice daily. DX E11.9  . insulin detemir (LEVEMIR FLEXTOUCH) 100 UNIT/ML FlexPen Inject 20 Units into the skin daily.  . Insulin Pen Needle (PEN NEEDLES) 31G X 8 MM MISC Use to inject insulin daily as prescribed  . Multiple Vitamin (MULTIVITAMIN WITH MINERALS) TABS tablet Take 1 tablet by mouth daily.  Glory Rosebush Delica Lancets 29U MISC Use to test blood sugar twice daily. DX E11.9  . pantoprazole (PROTONIX) 40 MG tablet TAKE (1) TABLET TWICE A DAY BEFORE MEALS. (Patient taking differently: Take 40 mg by mouth 2 (two) times daily. )  . Plant Sterols and Stanols (CHOLEST OFF PO) Take 1 tablet by mouth daily.   No facility-administered encounter medications on file as of 06/09/2020.    Past Surgical  History:  Procedure Laterality Date  . BIOPSY  04/25/2020   Procedure: BIOPSY;  Surgeon: [name], DO;  Location: [facility];  Service: Endoscopy;;  duodenum gastric esophagus  . COLONOSCOPY WITH PROPOFOL N/A 04/25/2020   Procedure: COLONOSCOPY WITH PROPOFOL;  Surgeon: [name], DO;  Location: [facility];  Service: Endoscopy;  Laterality: N/A;  10:15am  . CYSTOSCOPY W/ URETERAL STENT PLACEMENT Bilateral 12/09/2013   Procedure: CYSTOSCOPY WITH RETROGRADE PYELOGRAM/URETERAL STENT PLACEMENT;  Surgeon: Alexis Frock, MD;  Location: [facility];  Service: Urology;  Laterality: Bilateral;  . CYSTOSCOPY WITH RETROGRADE PYELOGRAM, URETEROSCOPY AND STENT PLACEMENT Bilateral 09/30/2013   Procedure: CYSTOSCOPY WITH BILATERAL RETROGRADE PYELOGRAM, LEFT DIAGNOSTIC URETEROSCOPY AND Left ureteral stent;  Surgeon: Alexis Frock, MD;  Location: [facility];  Service: Urology;  Laterality: Bilateral;  . ESOPHAGOGASTRODUODENOSCOPY (EGD) WITH PROPOFOL N/A 04/25/2020   Procedure: ESOPHAGOGASTRODUODENOSCOPY (EGD) WITH PROPOFOL;  Surgeon: [name], DO;  Location: [facility];  Service: Endoscopy;  Laterality: N/A;  . PERCUTANEOUS NEPHROLITHOTRIPSY  2005  . POLYPECTOMY  04/25/2020   Procedure: POLYPECTOMY;  Surgeon: [name], DO;  Location: [facility];  Service: Endoscopy;;  colon  . ROBOT ASSISTED PYELOPLASTY N/A 12/09/2013   Procedure: ROBOTIC ASSISTED BILATERAL PYELOLITHOTOMY, RIGHT  PYELOPLASTY ;  Surgeon: Alexis Frock, MD;  Location: [facility];  Service: Urology;  Laterality: N/A;    Family History  Problem Relation Age of Onset  . Cancer Mother   . Colon cancer Neg Hx   . Pancreatitis Neg Hx     New complaints: None today  Social history: Lives with girlfriend  Controlled substance contract: n/a    Review of Systems  Constitutional: Negative for diaphoresis.  Eyes: Negative for pain.  Respiratory: Negative for shortness of breath.    Cardiovascular: Negative for chest pain, palpitations and leg swelling.  Gastrointestinal: Negative for abdominal pain.  Endocrine: Negative for polydipsia.  Skin: Negative for rash.  Neurological: Negative for dizziness, weakness and headaches.  Hematological: Does not bruise/bleed easily.  All other systems reviewed and are negative.      Objective:   Physical Exam Vitals and nursing note reviewed.  Constitutional:      Appearance: Normal appearance. He is well-developed and well-nourished.  HENT:     Head: Normocephalic.     Nose: Nose normal.     Mouth/Throat:     Mouth: Oropharynx is clear and moist.  Eyes:     Extraocular Movements: EOM normal.     Pupils: Pupils are equal, round, and reactive to light.  Neck:     Thyroid: No thyroid mass or thyromegaly.     Vascular: No carotid  bruit or JVD.     Trachea: Phonation normal.  Cardiovascular:     Rate and Rhythm: Normal rate and regular rhythm.  Pulmonary:     Effort: Pulmonary effort is normal. No respiratory distress.     Breath sounds: Normal breath sounds.  Abdominal:     General: Bowel sounds are normal. Aorta is normal.     Palpations: Abdomen is soft.     Tenderness: There is no abdominal tenderness.  Musculoskeletal:        General: Normal range of motion.     Cervical back: Normal range of motion and neck supple.  Lymphadenopathy:     Cervical: No cervical adenopathy.  Skin:    General: Skin is warm and dry.  Neurological:     Mental Status: He is alert and oriented to person, place, and time.  Psychiatric:        Mood and Affect: Mood and affect normal.        Behavior: Behavior normal.        Thought Content: Thought content normal.        Judgment: Judgment normal.     BP (!) 159/77   Pulse 93   Temp 98.4 F (36.9 C) (Temporal)   Resp 20   Ht '6\' 1"'  (1.854 m)   Wt 181 lb (82.1 kg)   SpO2 100%   BMI 23.88 kg/m   .hgba1c 12.6       Assessment & Plan:  Philip Richardson comes in today  with chief complaint of Medical Management of Chronic Issues   Diagnosis and orders addressed:  1. Type 2 diabetes mellitus without complication, without long-term current use of insulin (HCC) Strict carb counting Increased levemir to 25u for 3 days then 30u daily - Bayer DCA Hb A1c Waived - Microalbumin / creatinine urine ratio - glimepiride (AMARYL) 4 MG tablet; Take 1 tablet (4 mg total) by mouth daily before breakfast.  Dispense: 90 tablet; Refill: 1 - insulin detemir (LEVEMIR FLEXTOUCH) 100 UNIT/ML FlexPen; Inject 30 Units into the skin daily.  Dispense: 15 mL; Refill: 11 - dapagliflozin propanediol (FARXIGA) 10 MG TABS tablet; Take 1 tablet (10 mg total) by mouth daily.  Dispense: 90 tablet; Refill: 1  2. Hyperlipidemia with target LDL less than 100 Low fat diet - Lipid panel  3. Primary hypertension Low sodium diet - CBC with Differential/Platelet - CMP14+EGFR - cloNIDine (CATAPRES) 0.1 MG tablet; Take 1 tablet (0.1 mg total) by mouth 3 (three) times daily.  Dispense: 270 tablet; Refill: 1 - amLODipine (NORVASC) 10 MG tablet; Take 1 tablet (10 mg total) by mouth daily.  Dispense: 90 tablet; Refill: 1  4. Hyperkalemia Labs oending  5. Elevated LFTs Labs oending  6. Esophageal dysphagia  7. Gastroesophageal reflux disease, unspecified whether esophagitis present Avoid spicy foods Do not eat 2 hours prior to bedtime - pantoprazole (PROTONIX) 40 MG tablet; Take 1 tablet (40 mg total) by mouth 2 (two) times daily.  Dispense: 180 tablet; Refill: 1  8. Normocytic anemia - ferrous sulfate 325 (65 FE) MG tablet; Take 1 tablet (325 mg total) by mouth daily with breakfast.  Dispense: 90 tablet; Refill: 1   Labs pending Health Maintenance reviewed Diet and exercise encouraged  Follow up plan: 1 month   [place], FNP

## 2020-06-10 LAB — LIPID PANEL
Chol/HDL Ratio: 6 ratio — ABNORMAL HIGH (ref 0.0–5.0)
Cholesterol, Total: 229 mg/dL — ABNORMAL HIGH (ref 100–199)
HDL: 38 mg/dL — ABNORMAL LOW (ref >39)
LDL Chol Calc (NIH): 133 mg/dL — ABNORMAL HIGH (ref 0–99)
Triglycerides: 325 mg/dL — ABNORMAL HIGH (ref 0–149)
VLDL Cholesterol Cal: 58 mg/dL — ABNORMAL HIGH (ref 5–40)

## 2020-06-10 LAB — CMP14+EGFR
ALT: 11 IU/L (ref 0–44)
AST: 12 IU/L (ref 0–40)
Albumin/Globulin Ratio: 1.7 (ref 1.2–2.2)
Albumin: 4.3 g/dL (ref 3.8–4.8)
Alkaline Phosphatase: 126 IU/L — ABNORMAL HIGH (ref 44–121)
BUN/Creatinine Ratio: 11 (ref 10–24)
BUN: 27 mg/dL (ref 8–27)
Bilirubin Total: 0.3 mg/dL (ref 0.0–1.2)
CO2: 21 mmol/L (ref 20–29)
Calcium: 9.5 mg/dL (ref 8.6–10.2)
Chloride: 97 mmol/L (ref 96–106)
Creatinine, Ser: 2.54 mg/dL — ABNORMAL HIGH (ref 0.76–1.27)
GFR calc Af Amer: 30 mL/min/{1.73_m2} — ABNORMAL LOW (ref >59)
GFR calc non Af Amer: 26 mL/min/{1.73_m2} — ABNORMAL LOW (ref >59)
Globulin, Total: 2.5 g/dL (ref 1.5–4.5)
Glucose: 512 mg/dL (ref 65–99)
Potassium: 4.4 mmol/L (ref 3.5–5.2)
Sodium: 135 mmol/L (ref 134–144)
Total Protein: 6.8 g/dL (ref 6.0–8.5)

## 2020-06-10 LAB — MICROALBUMIN / CREATININE URINE RATIO
Creatinine, Urine: 45.8 mg/dL
Microalb/Creat Ratio: 214 mg/g creat — ABNORMAL HIGH (ref 0–29)
Microalbumin, Urine: 98.1 ug/mL

## 2020-06-10 LAB — CBC WITH DIFFERENTIAL/PLATELET
Basophils Absolute: 0.1 10*3/uL (ref 0.0–0.2)
Basos: 1 %
EOS (ABSOLUTE): 0.1 10*3/uL (ref 0.0–0.4)
Eos: 1 %
Hematocrit: 35.1 % — ABNORMAL LOW (ref 37.5–51.0)
Hemoglobin: 11.7 g/dL — ABNORMAL LOW (ref 13.0–17.7)
Immature Grans (Abs): 0 10*3/uL (ref 0.0–0.1)
Immature Granulocytes: 0 %
Lymphocytes Absolute: 1 10*3/uL (ref 0.7–3.1)
Lymphs: 18 %
MCH: 26.8 pg (ref 26.6–33.0)
MCHC: 33.3 g/dL (ref 31.5–35.7)
MCV: 81 fL (ref 79–97)
Monocytes Absolute: 0.4 10*3/uL (ref 0.1–0.9)
Monocytes: 7 %
Neutrophils Absolute: 4.2 10*3/uL (ref 1.4–7.0)
Neutrophils: 73 %
Platelets: 225 10*3/uL (ref 150–450)
RBC: 4.36 x10E6/uL (ref 4.14–5.80)
RDW: 13.8 % (ref 11.6–15.4)
WBC: 5.7 10*3/uL (ref 3.4–10.8)

## 2020-06-21 MED ORDER — ROSUVASTATIN CALCIUM 20 MG PO TABS: 20.0000 mg | ORAL_TABLET | Freq: Every day | ORAL | 3 refills | Status: DC

## 2020-06-21 NOTE — Addendum Note (Signed)
Addended by: Chevis Pretty on: 06/21/2020 02:53 PM   Modules accepted: Orders

## 2020-06-28 ENCOUNTER — Other Ambulatory Visit: Payer: Self-pay

## 2020-06-28 ENCOUNTER — Other Ambulatory Visit: Payer: BC Managed Care – PPO

## 2020-06-28 DIAGNOSIS — I129 Hypertensive chronic kidney disease with stage 1 through stage 4 chronic kidney disease, or unspecified chronic kidney disease: Secondary | ICD-10-CM | POA: Diagnosis not present

## 2020-06-28 DIAGNOSIS — E1122 Type 2 diabetes mellitus with diabetic chronic kidney disease: Secondary | ICD-10-CM | POA: Diagnosis not present

## 2020-06-28 DIAGNOSIS — D638 Anemia in other chronic diseases classified elsewhere: Secondary | ICD-10-CM | POA: Diagnosis not present

## 2020-06-28 DIAGNOSIS — N189 Chronic kidney disease, unspecified: Secondary | ICD-10-CM | POA: Diagnosis not present

## 2020-07-07 DIAGNOSIS — N189 Chronic kidney disease, unspecified: Secondary | ICD-10-CM | POA: Diagnosis not present

## 2020-07-07 DIAGNOSIS — D638 Anemia in other chronic diseases classified elsewhere: Secondary | ICD-10-CM | POA: Diagnosis not present

## 2020-07-07 DIAGNOSIS — I129 Hypertensive chronic kidney disease with stage 1 through stage 4 chronic kidney disease, or unspecified chronic kidney disease: Secondary | ICD-10-CM | POA: Diagnosis not present

## 2020-07-08 ENCOUNTER — Ambulatory Visit: Payer: BC Managed Care – PPO | Admitting: Nurse Practitioner

## 2020-07-08 ENCOUNTER — Encounter: Payer: Self-pay | Admitting: Nurse Practitioner

## 2020-07-08 ENCOUNTER — Other Ambulatory Visit: Payer: Self-pay

## 2020-07-08 ENCOUNTER — Ambulatory Visit: Payer: Self-pay | Admitting: Nurse Practitioner

## 2020-07-08 VITALS — BP 156/75 | HR 67 | Temp 98.1°F | Resp 20 | Ht 73.0 in | Wt 185.0 lb

## 2020-07-08 DIAGNOSIS — Z794 Long term (current) use of insulin: Secondary | ICD-10-CM

## 2020-07-08 DIAGNOSIS — E1142 Type 2 diabetes mellitus with diabetic polyneuropathy: Secondary | ICD-10-CM

## 2020-07-08 DIAGNOSIS — E119 Type 2 diabetes mellitus without complications: Secondary | ICD-10-CM | POA: Diagnosis not present

## 2020-07-08 LAB — BAYER DCA HB A1C WAIVED: HB A1C (BAYER DCA - WAIVED): 12.4 % — ABNORMAL HIGH (ref <7.0)

## 2020-07-08 MED ORDER — FREESTYLE LIBRE 14 DAY SENSOR MISC: 1.0000 | 6 refills | Status: DC

## 2020-07-08 MED ORDER — FREESTYLE LIBRE READER DEVI: 1.0000 | Freq: Every day | 0 refills | Status: DC

## 2020-07-08 NOTE — Progress Notes (Signed)
   Subjective:    Patient ID: Philip Richardson, male    DOB: 1955/09/05, 65 y.o.   MRN: [id number]   Chief Complaint: diabates  HPI  Patient twas seen for chronic follow up on 06/09/20. His HGBA1c was 12.6. we increased his levemir from 20 u daily to 30u daily over 3-4 days. He has not been checking his blood sugars. He has been trying to watch his diet. His weight is down some since last visit. He should be checking blood sugars at least 3x a day but he doe snot like pricking his finger. He would do better with a continuous glucose monitor. With his bad kidney disease we really need to get this under control.  * he had follow up with nephrology on 07/07/20. He has u shaped kidneys and they think the combination of that and diabetes contributes to his renal failure. His last creatine was 2.63. nephrology discussed the importance of diabetes control on his kidneys. No changes were made to plan of care and he is to follow up in 3 months.  Review of Systems  Constitutional: Negative for diaphoresis.  Eyes: Negative for pain.  Respiratory: Negative for shortness of breath.   Cardiovascular: Negative for chest pain, palpitations and leg swelling.  Gastrointestinal: Negative for abdominal pain.  Endocrine: Negative for polydipsia.  Skin: Negative for rash.  Neurological: Negative for dizziness, weakness and headaches.  Hematological: Does not bruise/bleed easily.  All other systems reviewed and are negative.      Objective:   Physical Exam Vitals and nursing note reviewed.  Constitutional:      Appearance: Normal appearance.  Cardiovascular:     Rate and Rhythm: Normal rate and regular rhythm.     Heart sounds: Normal heart sounds.  Pulmonary:     Breath sounds: Normal breath sounds.  Skin:    General: Skin is warm and dry.  Neurological:     General: No focal deficit present.     Mental Status: He is alert and oriented to person, place, and time.  Psychiatric:        Mood and Affect:  Mood normal.        Behavior: Behavior normal.     BP (!) 156/75   Pulse 67   Temp 98.1 F (36.7 C) (Temporal)   Resp 20   Ht 6\' 1"  (1.854 m)   Wt 185 lb (83.9 kg)   SpO2 100%   BMI 24.41 kg/m   hgba1c 12.4        Assessment & Plan:  Philip Richardson in today with chief complaint of Diabetes   1. Type 2 diabetes mellitus without complication, without long-term current use of insulin (HCC) Increase levemir increase to 40u daily- may need to go to BD dosing.  Wold like to do [place] to make that determination.  - Bayer DCA Hb A1c Waived Orders Placed This Encounter  Procedures  . Bayer DCA Hb A1c Waived       The above assessment and management plan was discussed with the patient. The patient verbalized understanding of and has agreed to the management plan. Patient is aware to call the clinic if symptoms persist or worsen. Patient is aware when to return to the clinic for a follow-up visit. Patient educated on when it is appropriate to go to the emergency department.   Mary-Margaret Hassell Done, FNP

## 2020-07-08 NOTE — Patient Instructions (Signed)

## 2020-07-13 ENCOUNTER — Ambulatory Visit: Payer: BC Managed Care – PPO | Admitting: Gastroenterology

## 2020-07-15 ENCOUNTER — Ambulatory Visit (INDEPENDENT_AMBULATORY_CARE_PROVIDER_SITE_OTHER): Payer: BC Managed Care – PPO | Admitting: Gastroenterology

## 2020-07-15 ENCOUNTER — Encounter: Payer: Self-pay | Admitting: Gastroenterology

## 2020-07-15 ENCOUNTER — Other Ambulatory Visit: Payer: Self-pay

## 2020-07-15 VITALS — BP 169/84 | HR 78 | Temp 97.1°F | Ht 73.0 in | Wt 187.0 lb

## 2020-07-15 DIAGNOSIS — K219 Gastro-esophageal reflux disease without esophagitis: Secondary | ICD-10-CM

## 2020-07-15 NOTE — Patient Instructions (Signed)
Continue Protonix but let's decrease this to once daily. If you have recurrent reflux, you can increase back to twice a day.   We will see you back in 1 year or sooner if needed!   Please call with any concerns in the meantime!  I enjoyed seeing you again today! As you know, I value our relationship and want to provide genuine, compassionate, and quality care. I welcome your feedback. If you receive a survey regarding your visit,  I greatly appreciate you taking time to fill this out. See you next time!  Annitta Needs, PhD, ANP-BC [facility]

## 2020-07-15 NOTE — Progress Notes (Signed)
Referring Provider: Chevis Pretty, * Primary Care Physician:  Chevis Pretty, FNP Primary GI: Dr. Abbey Chatters  Chief Complaint  Patient presents with  . Anemia    Doing ok  . Dysphagia    Doing ok    HPI:   Philip Richardson is a 65 y.o. male presenting today with a history of idiopathic pancreatitis in Feb 2021, MRCP for dedicated pancreatic evaluation with resolving inflammatory changes and no mass, GERD, chronic normocytic anemia in setting of CKD, and dysphagia. Presenting in follow-up after initial screening colonoscopy and EGD/dilation.  Findings of candida esophagitis, negative H.pylori. Treated with Diflucan and completed the course. Colonoscopy with tubular adenoma and will need surveillance.    Remains on Protonix BID. Dysphagia resolved. No abdominal pain. No N/V. No overt GI bleeding. Feels well from a GI standpoint.   Past Medical History:  Diagnosis Date  . Frequency of urination   . GERD (gastroesophageal reflux disease)   . Horseshoe kidney    BILATERAL  . Hypertension   . Renal calculus, bilateral   . Type 2 diabetes mellitus ([place])   . Urgency of urination   . Wears dentures     Past Surgical History:  Procedure Laterality Date  . BIOPSY  04/25/2020   Procedure: BIOPSY;  Surgeon: [name], DO;  [facility];  Service: Endoscopy;;  duodenum gastric esophagus  . COLONOSCOPY WITH PROPOFOL N/A 04/25/2020   internal hemorrhoids, one 5 mm polyp in descending colon. Tubular adenoma. 5 year surveillance.  . CYSTOSCOPY W/ URETERAL STENT PLACEMENT Bilateral 12/09/2013   Procedure: CYSTOSCOPY WITH RETROGRADE PYELOGRAM/URETERAL STENT PLACEMENT;  Surgeon: Alexis Frock, MD;  Location: [facility];  Service: Urology;  Laterality: Bilateral;  . CYSTOSCOPY WITH RETROGRADE PYELOGRAM, URETEROSCOPY AND STENT PLACEMENT Bilateral 09/30/2013   Procedure: CYSTOSCOPY WITH BILATERAL RETROGRADE PYELOGRAM, LEFT DIAGNOSTIC URETEROSCOPY AND Left ureteral  stent;  Surgeon: Alexis Frock, MD;  [facility];  Service: Urology;  Laterality: Bilateral;  . ESOPHAGOGASTRODUODENOSCOPY (EGD) WITH PROPOFOL N/A 04/25/2020   Mildly severe candida esophagitis without bleed, s/p biopsy. Suspicion for eosinophilic esophagitis but no increased eosinophils. Gastritis. Reactive gastropathy. Negative H.pylori.  +KOH prep.   Marland Kitchen PERCUTANEOUS NEPHROLITHOTRIPSY  2005  . POLYPECTOMY  04/25/2020   Procedure: POLYPECTOMY;  Surgeon: [name], DO;  [facility];  Service: Endoscopy;;  colon  . ROBOT ASSISTED PYELOPLASTY N/A 12/09/2013   Procedure: ROBOTIC ASSISTED BILATERAL PYELOLITHOTOMY, RIGHT  PYELOPLASTY ;  Surgeon: Alexis Frock, MD;  Location: [facility];  Service: Urology;  Laterality: N/A;    Current Outpatient Medications  Medication Sig Dispense Refill  . acetaminophen (TYLENOL) 325 MG tablet Take 325-650 mg by mouth every 6 (six) hours as needed (for pain.).    Marland Kitchen amLODipine (NORVASC) 10 MG tablet Take 1 tablet (10 mg total) by mouth daily. 90 tablet 1  . Blood Glucose Monitoring Suppl ([place]) w/Device KIT Use to test blood sugar twice daily. DX E11.9 1 kit 0  . cloNIDine (CATAPRES) 0.1 MG tablet Take 1 tablet (0.1 mg total) by mouth 3 (three) times daily. 270 tablet 1  . Continuous Blood Gluc Receiver (FREESTYLE LIBRE READER) DEVI 1 each by Does not apply route daily. 1 each 0  . Continuous Blood Gluc Sensor (FREESTYLE LIBRE 14 DAY SENSOR) MISC 1 each by Does not apply route every 14 (fourteen) days. 2 each 6  . dapagliflozin propanediol (FARXIGA) 10 MG TABS tablet Take 1 tablet (10 mg total) by  mouth daily. 90 tablet 1  . ferrous sulfate 325 (65 FE) MG tablet Take 1 tablet (325 mg total) by mouth daily with breakfast. 90 tablet 1  . glimepiride (AMARYL) 4 MG tablet Take 1 tablet (4 mg total) by mouth daily before breakfast. 90 tablet 1  . glucose blood (ONETOUCH VERIO) test strip Use to test blood sugar  twice daily. DX E11.9 100 each 12  . insulin detemir (LEVEMIR FLEXTOUCH) 100 UNIT/ML FlexPen Inject 30 Units into the skin daily. (Patient taking differently: Inject 40 Units into the skin daily.) 15 mL 11  . Insulin Pen Needle (PEN NEEDLES) 31G X 8 MM MISC Use to inject insulin daily as prescribed 150 each 1  . OneTouch Delica Lancets 25K MISC Use to test blood sugar twice daily. DX E11.9 100 each 3  . pantoprazole (PROTONIX) 40 MG tablet Take 1 tablet (40 mg total) by mouth 2 (two) times daily. 180 tablet 1  . Plant Sterols and Stanols (CHOLEST OFF PO) Take 1 tablet by mouth daily.    . rosuvastatin (CRESTOR) 20 MG tablet Take 1 tablet (20 mg total) by mouth daily. 90 tablet 3   No current facility-administered medications for this visit.    Allergies as of 07/15/2020 - Review Complete 07/15/2020  Allergen Reaction Noted  . Atorvastatin  03/14/2020  . Invokana [canagliflozin] Other (See Comments) 11/29/2015    Family History  Problem Relation Age of Onset  . Cancer Mother   . Colon cancer Neg Hx   . Pancreatitis Neg Hx     Social History   Socioeconomic History  . Marital status: Single    Spouse name: Not on file  . Number of children: Not on file  . Years of education: Not on file  . Highest education level: Not on file  Occupational History  . Not on file  Tobacco Use  . Smoking status: Never Smoker  . Smokeless tobacco: Never Used  Vaping Use  . Vaping Use: Never used  Substance and Sexual Activity  . Alcohol use: No    Comment: no history of etoh use  . Drug use: No  . Sexual activity: Not on file  Other Topics Concern  . Not on file  Social History Narrative  . Not on file   Social Determinants of Health   Financial Resource Strain: Not on file  Food Insecurity: Not on file  Transportation Needs: Not on file  Physical Activity: Not on file  Stress: Not on file  Social Connections: Not on file    Review of Systems: Gen: Denies fever, chills,  anorexia. Denies fatigue, weakness, weight loss.  CV: Denies chest pain, palpitations, syncope, peripheral edema, and claudication. Resp: Denies dyspnea at rest, cough, wheezing, coughing up blood, and pleurisy. GI: see HPI Derm: Denies rash, itching, dry skin Psych: Denies depression, anxiety, memory loss, confusion. No homicidal or suicidal ideation.  Heme: Denies bruising, bleeding, and enlarged lymph nodes.  Physical Exam: BP (!) 169/84   Pulse 78   Temp (!) 97.1 F (36.2 C) (Temporal)   Ht '6\' 1"'  (1.854 m)   Wt 187 lb (84.8 kg)   BMI 24.67 kg/m  General:   Alert and oriented. No distress noted. Pleasant and cooperative.  Head:  Normocephalic and atraumatic. Eyes:  Conjuctiva clear without scleral icterus. Mouth:   Mask in place Abdomen:  +BS, soft, non-tender and non-distended. No rebound or guarding. No HSM or masses noted. Msk:  Symmetrical without gross deformities. Normal posture. Extremities:  Without edema. Neurologic:  Alert and  oriented x4 Psych:  Alert and cooperative. Normal mood and affect.  ASSESSMENT: Philip Richardson is a 65 y.o. male presenting today with history of idiopathic pancreatitis in Feb 2021, MRCP for dedicated pancreatic evaluation with resolving inflammatory changes and no mass, GERD, chronic normocytic anemia in setting of CKD, and dysphagia.  Dysphagia resolved s/p treatment of candida esophagitis. Continues on Protonix BID. GERD well-controlled. At this point, we will decrease Protonix to once per day. If he has breakthrough, he will call.      PLAN:  Decrease Protonix to once daily Return in 1 year Colonoscopy surveillance Nov 2026  Annitta Needs, PhD, ANP-BC [facility]

## 2020-07-18 ENCOUNTER — Encounter: Payer: Self-pay | Admitting: Gastroenterology

## 2020-07-18 NOTE — Progress Notes (Signed)
Cc'ed to pcp °

## 2020-07-29 LAB — HM DIABETES EYE EXAM

## 2020-08-05 ENCOUNTER — Other Ambulatory Visit: Payer: Self-pay

## 2020-08-05 ENCOUNTER — Ambulatory Visit: Payer: BC Managed Care – PPO | Admitting: Nurse Practitioner

## 2020-08-05 ENCOUNTER — Encounter: Payer: Self-pay | Admitting: Nurse Practitioner

## 2020-08-05 ENCOUNTER — Telehealth: Payer: Self-pay

## 2020-08-05 VITALS — BP 146/77 | HR 77 | Temp 98.5°F | Resp 20 | Ht 73.0 in | Wt 189.0 lb

## 2020-08-05 DIAGNOSIS — Z794 Long term (current) use of insulin: Secondary | ICD-10-CM

## 2020-08-05 DIAGNOSIS — E1142 Type 2 diabetes mellitus with diabetic polyneuropathy: Secondary | ICD-10-CM

## 2020-08-05 LAB — BAYER DCA HB A1C WAIVED: HB A1C (BAYER DCA - WAIVED): 10.6 % — ABNORMAL HIGH (ref <7.0)

## 2020-08-05 MED ORDER — DEXCOM G6 SENSOR MISC: 1.0000 | 11 refills | Status: DC

## 2020-08-05 MED ORDER — LEVEMIR FLEXTOUCH 100 UNIT/ML ~~LOC~~ SOPN
45.0000 [IU] | PEN_INJECTOR | Freq: Every day | SUBCUTANEOUS | 3 refills | Status: DC
Start: 2020-08-05 — End: 2020-10-07

## 2020-08-05 MED ORDER — DEXCOM G6 RECEIVER DEVI: 1.0000 | Freq: Every day | 0 refills | Status: DC

## 2020-08-05 MED ORDER — DEXCOM G6 TRANSMITTER MISC: 1.0000 | 1 refills | Status: DC

## 2020-08-05 NOTE — Progress Notes (Signed)
Subjective:    Patient ID: Philip Richardson, male    DOB: 23-Dec-1955, 65 y.o.   MRN: [id number]   Chief Complaint: Diabetes (/)   HPI Patient come sin today for recheck of diabetes. He has not been watching diet very closely and his HGBA1c have been very high. His last HGBA1c went up form 8.3 03/07/20 to 12.6 06/09/20. We increased his levemir dose to 40u daily. He was to continue farxiga and amaryl. He still does not check his blood sugars at home. We ordered the freestyle libre but he has not gotten it yet. He should be checking is blood sugars at least 3 x a day, since his HGBA1c is so high.   Review of Systems  Constitutional: Negative for diaphoresis.  Eyes: Negative for pain.  Respiratory: Negative for shortness of breath.   Cardiovascular: Negative for chest pain, palpitations and leg swelling.  Gastrointestinal: Negative for abdominal pain.  Endocrine: Negative for polydipsia.  Skin: Negative for rash.  Neurological: Negative for dizziness, weakness and headaches.  Hematological: Does not bruise/bleed easily.  All other systems reviewed and are negative.      Objective:   Physical Exam Vitals and nursing note reviewed.  Constitutional:      Appearance: Normal appearance. He is well-developed and well-nourished.  HENT:     Mouth/Throat:     Mouth: Oropharynx is clear and moist.  Eyes:     Extraocular Movements: EOM normal.     Pupils: Pupils are equal, round, and reactive to light.  Neck:     Thyroid: No thyroid mass or thyromegaly.     Vascular: No carotid bruit or JVD.     Trachea: Phonation normal.  Cardiovascular:     Rate and Rhythm: Normal rate and regular rhythm.  Pulmonary:     Effort: Pulmonary effort is normal. No respiratory distress.     Breath sounds: Normal breath sounds.  Abdominal:     General: Bowel sounds are normal. Aorta is normal.     Palpations: Abdomen is soft.     Tenderness: There is no abdominal tenderness.  Musculoskeletal:         General: Normal range of motion.     Cervical back: Normal range of motion and neck supple.  Lymphadenopathy:     Cervical: No cervical adenopathy.  Skin:    General: Skin is warm and dry.  Neurological:     Mental Status: He is alert and oriented to person, place, and time.  Psychiatric:        Mood and Affect: Mood and affect normal.        Behavior: Behavior normal.        Thought Content: Thought content normal.        Judgment: Judgment normal.    BP (!) 146/77   Pulse 77   Temp 98.5 F (36.9 C) (Temporal)   Resp 20   Ht 6\' 1"  (1.854 m)   Wt 189 lb (85.7 kg)   SpO2 100%   BMI 24.94 kg/m    HGBa1c 10.6%     Assessment & Plan:   Philip Richardson in today with chief complaint of Diabetes (/)   1. Type 2 diabetes mellitus with diabetic polyneuropathy, with long-term current use of insulin ([place]) Insurance prefers dexacom instead of libre watch carbs exrcise - Bayer DCA Hb A1c Waived - insulin detemir (LEVEMIR FLEXTOUCH) 100 UNIT/ML FlexPen; Inject 45 Units into the skin daily.  Dispense: 30 mL; Refill: 3 - Continuous Blood Gluc  Receiver ([place]) DEVI; 1 each by Does not apply route daily.  Dispense: 1 each; Refill: 0 - Continuous Blood Gluc Sensor (DEXCOM G6 SENSOR) MISC; 1 each by Does not apply route every 14 (fourteen) days.  Dispense: 2 each; Refill: 11    The above assessment and management plan was discussed with the patient. The patient verbalized understanding of and has agreed to the management plan. Patient is aware to call the clinic if symptoms persist or worsen. Patient is aware when to return to the clinic for a follow-up visit. Patient educated on when it is appropriate to go to the emergency department.   Mary-Margaret Hassell Done, FNP

## 2020-08-05 NOTE — Telephone Encounter (Signed)
Stew called--please send in a rx for [employer]

## 2020-08-05 NOTE — Telephone Encounter (Signed)
Called and spoke with Stew at [facility] and ordered a transmitter for patient

## 2020-08-05 NOTE — Patient Instructions (Signed)

## 2020-09-27 DIAGNOSIS — H40033 Anatomical narrow angle, bilateral: Secondary | ICD-10-CM | POA: Diagnosis not present

## 2020-09-27 DIAGNOSIS — E119 Type 2 diabetes mellitus without complications: Secondary | ICD-10-CM | POA: Diagnosis not present

## 2020-10-07 ENCOUNTER — Ambulatory Visit (INDEPENDENT_AMBULATORY_CARE_PROVIDER_SITE_OTHER): Payer: BC Managed Care – PPO | Admitting: Nurse Practitioner

## 2020-10-07 ENCOUNTER — Encounter: Payer: Self-pay | Admitting: Nurse Practitioner

## 2020-10-07 ENCOUNTER — Other Ambulatory Visit: Payer: Self-pay

## 2020-10-07 VITALS — BP 143/80 | HR 72 | Temp 98.3°F | Resp 20 | Ht 73.0 in | Wt 190.0 lb

## 2020-10-07 DIAGNOSIS — I10 Essential (primary) hypertension: Secondary | ICD-10-CM

## 2020-10-07 DIAGNOSIS — K219 Gastro-esophageal reflux disease without esophagitis: Secondary | ICD-10-CM

## 2020-10-07 DIAGNOSIS — Z794 Long term (current) use of insulin: Secondary | ICD-10-CM | POA: Diagnosis not present

## 2020-10-07 DIAGNOSIS — E785 Hyperlipidemia, unspecified: Secondary | ICD-10-CM

## 2020-10-07 DIAGNOSIS — H40033 Anatomical narrow angle, bilateral: Secondary | ICD-10-CM | POA: Diagnosis not present

## 2020-10-07 DIAGNOSIS — E875 Hyperkalemia: Secondary | ICD-10-CM

## 2020-10-07 DIAGNOSIS — R1319 Other dysphagia: Secondary | ICD-10-CM

## 2020-10-07 DIAGNOSIS — D649 Anemia, unspecified: Secondary | ICD-10-CM

## 2020-10-07 DIAGNOSIS — E119 Type 2 diabetes mellitus without complications: Secondary | ICD-10-CM | POA: Diagnosis not present

## 2020-10-07 DIAGNOSIS — E1142 Type 2 diabetes mellitus with diabetic polyneuropathy: Secondary | ICD-10-CM | POA: Diagnosis not present

## 2020-10-07 LAB — BAYER DCA HB A1C WAIVED: HB A1C (BAYER DCA - WAIVED): 9.4 % — ABNORMAL HIGH (ref <7.0)

## 2020-10-07 MED ORDER — PANTOPRAZOLE SODIUM 40 MG PO TBEC
40.0000 mg | DELAYED_RELEASE_TABLET | Freq: Two times a day (BID) | ORAL | 1 refills | Status: DC
Start: 2020-10-07 — End: 2021-09-19

## 2020-10-07 MED ORDER — LEVEMIR FLEXTOUCH 100 UNIT/ML ~~LOC~~ SOPN: 55.0000 [IU] | PEN_INJECTOR | Freq: Every day | SUBCUTANEOUS | 3 refills | Status: DC

## 2020-10-07 MED ORDER — GLIMEPIRIDE 4 MG PO TABS: 4.0000 mg | ORAL_TABLET | Freq: Every day | ORAL | 1 refills | Status: DC

## 2020-10-07 MED ORDER — DAPAGLIFLOZIN PROPANEDIOL 10 MG PO TABS: 10.0000 mg | ORAL_TABLET | Freq: Every day | ORAL | 1 refills | Status: DC

## 2020-10-07 MED ORDER — FERROUS SULFATE 325 (65 FE) MG PO TABS: 325.0000 mg | ORAL_TABLET | Freq: Every day | ORAL | 1 refills | Status: DC

## 2020-10-07 MED ORDER — CLONIDINE HCL 0.1 MG PO TABS: 0.1000 mg | ORAL_TABLET | Freq: Three times a day (TID) | ORAL | 1 refills | Status: DC

## 2020-10-07 MED ORDER — AMLODIPINE BESYLATE 10 MG PO TABS: 10.0000 mg | ORAL_TABLET | Freq: Every day | ORAL | 1 refills | Status: DC

## 2020-10-07 MED ORDER — ROSUVASTATIN CALCIUM 20 MG PO TABS: 20.0000 mg | ORAL_TABLET | Freq: Every day | ORAL | 3 refills | Status: DC

## 2020-10-07 NOTE — Progress Notes (Signed)
Subjective:    Patient ID: Philip Richardson, male    DOB: 11/04/1955, 65 y.o.   MRN: [id number]   Chief Complaint: Medical Management of Chronic Issues    HPI:  1. Type 2 diabetes mellitus with diabetic polyneuropathy, with long-term current use of insulin ([facility]) Fasting blood sugars running 80-100. No lows to speak of. Has dexacom and he checks it frequently. Has had some readings of over 300 though. Right now it is 223. We increased levemir to 45u. Lab Results  Component Value Date   HGBA1C 10.6 (H) 08/05/2020     2. Hyperlipidemia with target LDL less than 100 Not watching diet and doing no exercise. Is on crestor and is tolerating it well. Lab Results  Component Value Date   CHOL 229 (H) 06/09/2020   HDL 38 (L) 06/09/2020   LDLCALC 133 (H) 06/09/2020   LDLDIRECT 113 (H) 04/08/2018   TRIG 325 (H) 06/09/2020   CHOLHDL 6.0 (H) 06/09/2020     3. Primary hypertension No c/o chest pain, sob or headaches. He does not check his blood pressure at home. BP Readings from Last 3 Encounters:  10/07/20 (!) 143/80  08/05/20 (!) 146/77  07/15/20 (!) 169/84     4. Gastroesophageal reflux disease, unspecified whether esophagitis present Is on protonix which is working well for him  5. Esophageal dysphagia Has had no issues with his swallowing since he started on protonix.  6. Hyperkalemia No lower ext cramping Lab Results  Component Value Date   K 4.4 06/09/2020     7. Normocytic anemia No c/o fatigue. Lab Results  Component Value Date   HGB 11.7 (L) 06/09/2020       Outpatient Encounter Medications as of 10/07/2020  Medication Sig  . acetaminophen (TYLENOL) 325 MG tablet Take 325-650 mg by mouth every 6 (six) hours as needed (for pain.).  Marland Kitchen amLODipine (NORVASC) 10 MG tablet Take 1 tablet (10 mg total) by mouth daily.  . Blood Glucose Monitoring Suppl ([place]) w/Device KIT Use to test blood sugar twice daily. DX E11.9  . cloNIDine (CATAPRES) 0.1  MG tablet Take 1 tablet (0.1 mg total) by mouth 3 (three) times daily.  . Continuous Blood Gluc Receiver ([place]) DEVI 1 each by Does not apply route daily.  . Continuous Blood Gluc Sensor (DEXCOM G6 SENSOR) MISC 1 each by Does not apply route every 14 (fourteen) days.  . Continuous Blood Gluc Transmit (DEXCOM G6 TRANSMITTER) MISC 1 each by Does not apply route every 3 (three) months.  . dapagliflozin propanediol (FARXIGA) 10 MG TABS tablet Take 1 tablet (10 mg total) by mouth daily.  . ferrous sulfate 325 (65 FE) MG tablet Take 1 tablet (325 mg total) by mouth daily with breakfast.  . glimepiride (AMARYL) 4 MG tablet Take 1 tablet (4 mg total) by mouth daily before breakfast.  . glucose blood (ONETOUCH VERIO) test strip Use to test blood sugar twice daily. DX E11.9  . insulin detemir (LEVEMIR FLEXTOUCH) 100 UNIT/ML FlexPen Inject 45 Units into the skin daily.  . Insulin Pen Needle (PEN NEEDLES) 31G X 8 MM MISC Use to inject insulin daily as prescribed  . OneTouch Delica Lancets 73X MISC Use to test blood sugar twice daily. DX E11.9  . pantoprazole (PROTONIX) 40 MG tablet Take 1 tablet (40 mg total) by mouth 2 (two) times daily.  . Plant Sterols and Stanols (CHOLEST OFF PO) Take 1 tablet by mouth daily.  . rosuvastatin (CRESTOR) 20 MG  tablet Take 1 tablet (20 mg total) by mouth daily.   No facility-administered encounter medications on file as of 10/07/2020.    Past Surgical History:  Procedure Laterality Date  . BIOPSY  04/25/2020   Procedure: BIOPSY;  Surgeon: [name], DO;  [facility];  Service: Endoscopy;;  duodenum gastric esophagus  . COLONOSCOPY WITH PROPOFOL N/A 04/25/2020   internal hemorrhoids, one 5 mm polyp in descending colon. Tubular adenoma. 5 year surveillance.  . CYSTOSCOPY W/ URETERAL STENT PLACEMENT Bilateral 12/09/2013   Procedure: CYSTOSCOPY WITH RETROGRADE PYELOGRAM/URETERAL STENT PLACEMENT;  Surgeon: Alexis Frock, MD;  [facility];  Service: Urology;  Laterality: Bilateral;  . CYSTOSCOPY WITH RETROGRADE PYELOGRAM, URETEROSCOPY AND STENT PLACEMENT Bilateral 09/30/2013   Procedure: CYSTOSCOPY WITH BILATERAL RETROGRADE PYELOGRAM, LEFT DIAGNOSTIC URETEROSCOPY AND Left ureteral stent;  Surgeon: Alexis Frock, MD;  Location: [facility];  Service: Urology;  Laterality: Bilateral;  . ESOPHAGOGASTRODUODENOSCOPY (EGD) WITH PROPOFOL N/A 04/25/2020   Mildly severe candida esophagitis without bleed, s/p biopsy. Suspicion for eosinophilic esophagitis but no increased eosinophils. Gastritis. Reactive gastropathy. Negative H.pylori.  +KOH prep.   Marland Kitchen PERCUTANEOUS NEPHROLITHOTRIPSY  2005  . POLYPECTOMY  04/25/2020   Procedure: POLYPECTOMY;  Surgeon: [name], DO;  [facility];  Service: Endoscopy;;  colon  . ROBOT ASSISTED PYELOPLASTY N/A 12/09/2013   Procedure: ROBOTIC ASSISTED BILATERAL PYELOLITHOTOMY, RIGHT  PYELOPLASTY ;  Surgeon: Alexis Frock, MD;  Location: [facility];  Service: Urology;  Laterality: N/A;    Family History  Problem Relation Age of Onset  . Cancer Mother   . Colon cancer Neg Hx   . Pancreatitis Neg Hx     New complaints: None today  Social history: Lives with girlfriend  Controlled substance contract: n/a    Review of Systems  Constitutional: Negative for diaphoresis.  Eyes: Negative for pain.  Respiratory: Negative for shortness of breath.   Cardiovascular: Negative for chest pain, palpitations and leg swelling.  Gastrointestinal: Negative for abdominal pain.  Endocrine: Negative for polydipsia.  Skin: Negative for rash.  Neurological: Negative for dizziness, weakness and headaches.  Hematological: Does not bruise/bleed easily.  All other systems reviewed and are negative.      Objective:   Physical Exam Vitals and nursing note reviewed.  Constitutional:      Appearance: Normal appearance. He is well-developed.  HENT:     Head: Normocephalic.     Nose:  Nose normal.  Eyes:     Pupils: Pupils are equal, round, and reactive to light.  Neck:     Thyroid: No thyroid mass or thyromegaly.     Vascular: No carotid bruit or JVD.     Trachea: Phonation normal.  Cardiovascular:     Rate and Rhythm: Normal rate and regular rhythm.  Pulmonary:     Effort: Pulmonary effort is normal. No respiratory distress.     Breath sounds: Normal breath sounds.  Abdominal:     General: Bowel sounds are normal.     Palpations: Abdomen is soft.     Tenderness: There is no abdominal tenderness.  Musculoskeletal:        General: Normal range of motion.     Cervical back: Normal range of motion and neck supple.  Lymphadenopathy:     Cervical: No cervical adenopathy.  Skin:    General: Skin is warm and dry.  Neurological:     Mental Status: He is alert and oriented to person, place, and time.  Psychiatric:  Behavior: Behavior normal.        Thought Content: Thought content normal.        Judgment: Judgment normal.    BP (!) 143/80   Pulse 72   Temp 98.3 F (36.8 C) (Temporal)   Resp 20   Ht _0  (1.854 m)   Wt 190 lb (86.2 kg)   SpO2 100%   BMI 25.07 kg/m   hgba1c 9.3%       Assessment & Plan:  Brysten Reister comes in today with chief complaint of Medical Management of Chronic Issues   Diagnosis and orders addressed:  1. Type 2 diabetes mellitus with diabetic polyneuropathy, with long-term current use of insulin ([facility]) Increase levemir to 50u for 1 week then to 55u - Bayer DCA Hb A1c Waived - insulin detemir (LEVEMIR FLEXTOUCH) 100 UNIT/ML FlexPen; Inject 55 Units into the skin daily.  Dispense: 30 mL; Refill: 3 - glimepiride (AMARYL) 4 MG tablet; Take 1 tablet (4 mg total) by mouth daily before breakfast.  Dispense: 90 tablet; Refill: 1 - dapagliflozin propanediol (FARXIGA) 10 MG TABS tablet; Take 1 tablet (10 mg total) by mouth daily.  Dispense: 90 tablet; Refill: 1  2. Hyperlipidemia with target LDL less than 100 Low fat  diet - Lipid panel - rosuvastatin (CRESTOR) 20 MG tablet; Take 1 tablet (20 mg total) by mouth daily.  Dispense: 90 tablet; Refill: 3  3. Primary hypertension Low sodium diet - CBC with Differential/Platelet - CMP14+EGFR - amLODipine (NORVASC) 10 MG tablet; Take 1 tablet (10 mg total) by mouth daily.  Dispense: 90 tablet; Refill: 1 - cloNIDine (CATAPRES) 0.1 MG tablet; Take 1 tablet (0.1 mg total) by mouth 3 (three) times daily.  Dispense: 270 tablet; Refill: 1  4. Gastroesophageal reflux disease, unspecified whether esophagitis present Avoid spicy foods Do not eat 2 hours prior to bedtime - pantoprazole (PROTONIX) 40 MG tablet; Take 1 tablet (40 mg total) by mouth 2 (two) times daily.  Dispense: 180 tablet; Refill: 1  5. Esophageal dysphagia Chew food well  6. Hyperkalemia Labs peding  7. Normocytic anemia Labs pending - ferrous sulfate 325 (65 FE) MG tablet; Take 1 tablet (325 mg total) by mouth daily with breakfast.  Dispense: 90 tablet; Refill: 1   Labs pending Health Maintenance reviewed Diet and exercise encouraged  Follow up plan: 3 months   Mary-Margaret Hassell Done, FNP

## 2020-10-08 LAB — CMP14+EGFR
ALT: 12 IU/L (ref 0–44)
AST: 13 IU/L (ref 0–40)
Albumin/Globulin Ratio: 1.6 (ref 1.2–2.2)
Albumin: 4.1 g/dL (ref 3.8–4.8)
Alkaline Phosphatase: 134 IU/L — ABNORMAL HIGH (ref 44–121)
BUN/Creatinine Ratio: 12 (ref 10–24)
BUN: 29 mg/dL — ABNORMAL HIGH (ref 8–27)
Bilirubin Total: 0.4 mg/dL (ref 0.0–1.2)
CO2: 21 mmol/L (ref 20–29)
Calcium: 10 mg/dL (ref 8.6–10.2)
Chloride: 99 mmol/L (ref 96–106)
Creatinine, Ser: 2.48 mg/dL — ABNORMAL HIGH (ref 0.76–1.27)
Globulin, Total: 2.5 g/dL (ref 1.5–4.5)
Glucose: 222 mg/dL — ABNORMAL HIGH (ref 65–99)
Potassium: 5.3 mmol/L — ABNORMAL HIGH (ref 3.5–5.2)
Sodium: 138 mmol/L (ref 134–144)
Total Protein: 6.6 g/dL (ref 6.0–8.5)
eGFR: 28 mL/min/{1.73_m2} — ABNORMAL LOW (ref >59)

## 2020-10-08 LAB — CBC WITH DIFFERENTIAL/PLATELET
Basophils Absolute: 0.1 10*3/uL (ref 0.0–0.2)
Basos: 1 %
EOS (ABSOLUTE): 0.1 10*3/uL (ref 0.0–0.4)
Eos: 1 %
Hematocrit: 36.6 % — ABNORMAL LOW (ref 37.5–51.0)
Hemoglobin: 11.7 g/dL — ABNORMAL LOW (ref 13.0–17.7)
Immature Grans (Abs): 0 10*3/uL (ref 0.0–0.1)
Immature Granulocytes: 0 %
Lymphocytes Absolute: 1.4 10*3/uL (ref 0.7–3.1)
Lymphs: 19 %
MCH: 25.3 pg — ABNORMAL LOW (ref 26.6–33.0)
MCHC: 32 g/dL (ref 31.5–35.7)
MCV: 79 fL (ref 79–97)
Monocytes Absolute: 0.6 10*3/uL (ref 0.1–0.9)
Monocytes: 8 %
Neutrophils Absolute: 5.3 10*3/uL (ref 1.4–7.0)
Neutrophils: 71 %
Platelets: 319 10*3/uL (ref 150–450)
RBC: 4.63 x10E6/uL (ref 4.14–5.80)
RDW: 14 % (ref 11.6–15.4)
WBC: 7.5 10*3/uL (ref 3.4–10.8)

## 2020-10-08 LAB — LIPID PANEL
Chol/HDL Ratio: 5.4 ratio — ABNORMAL HIGH (ref 0.0–5.0)
Cholesterol, Total: 185 mg/dL (ref 100–199)
HDL: 34 mg/dL — ABNORMAL LOW (ref >39)
LDL Chol Calc (NIH): 120 mg/dL — ABNORMAL HIGH (ref 0–99)
Triglycerides: 171 mg/dL — ABNORMAL HIGH (ref 0–149)
VLDL Cholesterol Cal: 31 mg/dL (ref 5–40)

## 2020-10-11 DIAGNOSIS — H04123 Dry eye syndrome of bilateral lacrimal glands: Secondary | ICD-10-CM | POA: Diagnosis not present

## 2020-12-20 ENCOUNTER — Other Ambulatory Visit: Payer: Self-pay

## 2020-12-20 ENCOUNTER — Other Ambulatory Visit: Payer: BC Managed Care – PPO

## 2020-12-20 DIAGNOSIS — N189 Chronic kidney disease, unspecified: Secondary | ICD-10-CM | POA: Diagnosis not present

## 2020-12-20 DIAGNOSIS — D638 Anemia in other chronic diseases classified elsewhere: Secondary | ICD-10-CM | POA: Diagnosis not present

## 2020-12-20 DIAGNOSIS — I129 Hypertensive chronic kidney disease with stage 1 through stage 4 chronic kidney disease, or unspecified chronic kidney disease: Secondary | ICD-10-CM | POA: Diagnosis not present

## 2020-12-20 DIAGNOSIS — E1122 Type 2 diabetes mellitus with diabetic chronic kidney disease: Secondary | ICD-10-CM | POA: Diagnosis not present

## 2020-12-20 DIAGNOSIS — E1129 Type 2 diabetes mellitus with other diabetic kidney complication: Secondary | ICD-10-CM | POA: Diagnosis not present

## 2021-01-06 DIAGNOSIS — N189 Chronic kidney disease, unspecified: Secondary | ICD-10-CM | POA: Diagnosis not present

## 2021-01-06 DIAGNOSIS — D638 Anemia in other chronic diseases classified elsewhere: Secondary | ICD-10-CM | POA: Diagnosis not present

## 2021-01-06 DIAGNOSIS — I129 Hypertensive chronic kidney disease with stage 1 through stage 4 chronic kidney disease, or unspecified chronic kidney disease: Secondary | ICD-10-CM | POA: Diagnosis not present

## 2021-01-06 DIAGNOSIS — R809 Proteinuria, unspecified: Secondary | ICD-10-CM | POA: Diagnosis not present

## 2021-01-10 ENCOUNTER — Encounter: Payer: Self-pay | Admitting: Nurse Practitioner

## 2021-01-10 ENCOUNTER — Other Ambulatory Visit: Payer: Self-pay

## 2021-01-10 ENCOUNTER — Ambulatory Visit: Payer: BC Managed Care – PPO | Admitting: Nurse Practitioner

## 2021-01-10 VITALS — BP 115/66 | HR 57 | Temp 98.1°F | Resp 20 | Ht 73.0 in | Wt 192.0 lb

## 2021-01-10 DIAGNOSIS — Z794 Long term (current) use of insulin: Secondary | ICD-10-CM | POA: Diagnosis not present

## 2021-01-10 DIAGNOSIS — I10 Essential (primary) hypertension: Secondary | ICD-10-CM

## 2021-01-10 DIAGNOSIS — D649 Anemia, unspecified: Secondary | ICD-10-CM

## 2021-01-10 DIAGNOSIS — E785 Hyperlipidemia, unspecified: Secondary | ICD-10-CM

## 2021-01-10 DIAGNOSIS — K219 Gastro-esophageal reflux disease without esophagitis: Secondary | ICD-10-CM

## 2021-01-10 DIAGNOSIS — E875 Hyperkalemia: Secondary | ICD-10-CM

## 2021-01-10 DIAGNOSIS — R7989 Other specified abnormal findings of blood chemistry: Secondary | ICD-10-CM

## 2021-01-10 DIAGNOSIS — E1142 Type 2 diabetes mellitus with diabetic polyneuropathy: Secondary | ICD-10-CM | POA: Diagnosis not present

## 2021-01-10 LAB — LIPID PANEL
Chol/HDL Ratio: 5.6 ratio — ABNORMAL HIGH (ref 0.0–5.0)
Cholesterol, Total: 201 mg/dL — ABNORMAL HIGH (ref 100–199)
HDL: 36 mg/dL — ABNORMAL LOW (ref >39)
LDL Chol Calc (NIH): 137 mg/dL — ABNORMAL HIGH (ref 0–99)
Triglycerides: 154 mg/dL — ABNORMAL HIGH (ref 0–149)
VLDL Cholesterol Cal: 28 mg/dL (ref 5–40)

## 2021-01-10 LAB — CMP14+EGFR
ALT: 15 IU/L (ref 0–44)
AST: 12 IU/L (ref 0–40)
Albumin/Globulin Ratio: 1.7 (ref 1.2–2.2)
Albumin: 4.2 g/dL (ref 3.8–4.8)
Alkaline Phosphatase: 123 IU/L — ABNORMAL HIGH (ref 44–121)
BUN/Creatinine Ratio: 12 (ref 10–24)
BUN: 34 mg/dL — ABNORMAL HIGH (ref 8–27)
Bilirubin Total: 0.3 mg/dL (ref 0.0–1.2)
CO2: 23 mmol/L (ref 20–29)
Calcium: 9.8 mg/dL (ref 8.6–10.2)
Chloride: 99 mmol/L (ref 96–106)
Creatinine, Ser: 2.83 mg/dL — ABNORMAL HIGH (ref 0.76–1.27)
Globulin, Total: 2.5 g/dL (ref 1.5–4.5)
Glucose: 138 mg/dL — ABNORMAL HIGH (ref 65–99)
Potassium: 4.8 mmol/L (ref 3.5–5.2)
Sodium: 137 mmol/L (ref 134–144)
Total Protein: 6.7 g/dL (ref 6.0–8.5)
eGFR: 24 mL/min/{1.73_m2} — ABNORMAL LOW (ref >59)

## 2021-01-10 LAB — CBC WITH DIFFERENTIAL/PLATELET
Basophils Absolute: 0.1 10*3/uL (ref 0.0–0.2)
Basos: 1 %
EOS (ABSOLUTE): 0.1 10*3/uL (ref 0.0–0.4)
Eos: 1 %
Hematocrit: 36.5 % — ABNORMAL LOW (ref 37.5–51.0)
Hemoglobin: 12.4 g/dL — ABNORMAL LOW (ref 13.0–17.7)
Immature Grans (Abs): 0 10*3/uL (ref 0.0–0.1)
Immature Granulocytes: 0 %
Lymphocytes Absolute: 1.2 10*3/uL (ref 0.7–3.1)
Lymphs: 16 %
MCH: 26.1 pg — ABNORMAL LOW (ref 26.6–33.0)
MCHC: 34 g/dL (ref 31.5–35.7)
MCV: 77 fL — ABNORMAL LOW (ref 79–97)
Monocytes Absolute: 0.6 10*3/uL (ref 0.1–0.9)
Monocytes: 8 %
Neutrophils Absolute: 5.9 10*3/uL (ref 1.4–7.0)
Neutrophils: 74 %
Platelets: 236 10*3/uL (ref 150–450)
RBC: 4.76 x10E6/uL (ref 4.14–5.80)
RDW: 14.2 % (ref 11.6–15.4)
WBC: 7.9 10*3/uL (ref 3.4–10.8)

## 2021-01-10 LAB — BAYER DCA HB A1C WAIVED: HB A1C (BAYER DCA - WAIVED): 9.5 % — ABNORMAL HIGH (ref <7.0)

## 2021-01-10 MED ORDER — LEVEMIR FLEXTOUCH 100 UNIT/ML ~~LOC~~ SOPN: 60.0000 [IU] | PEN_INJECTOR | Freq: Every day | SUBCUTANEOUS | 3 refills | Status: DC

## 2021-01-10 NOTE — Progress Notes (Signed)
Subjective:    Patient ID: Philip Richardson, male    DOB: 1956-05-07, 65 y.o.   MRN: [id number]  Chief Complaint: Medical Management of Chronic Issues    HPI:  1. Type 2 diabetes mellitus with diabetic polyneuropathy, with long-term current use of insulin (HCC) Blood sugar are ranging from 70 in mornings to over 3000 in the evenings. He says his diet has not changed Lab Results  Component Value Date   HGBA1C 9.4 (H) 10/07/2020     2. Hyperlipidemia with target LDL less than 100 Doe sot watch diet very closely. Has been walking daily for over 3 months ago. Lab Results  Component Value Date   CHOL 185 10/07/2020   HDL 34 (L) 10/07/2020   LDLCALC 120 (H) 10/07/2020   LDLDIRECT 113 (H) 04/08/2018   TRIG 171 (H) 10/07/2020   CHOLHDL 5.4 (H) 10/07/2020     3. Primary hypertension No c/o chest pain, sob or headache. BP Readings from Last 3 Encounters:  01/10/21 115/66  10/07/20 (!) 143/80  08/05/20 (!) 146/77     4. Gastroesophageal reflux disease, unspecified whether esophagitis present Is on pantopozole daily and is doing well.  5. Hyperkalemia No c/o lower ext cramping Lab Results  Component Value Date   K 5.3 (H) 10/07/2020     6. Elevated LFTs No problems that aware of. Lab Results  Component Value Date   ALT 12 10/07/2020   AST 13 10/07/2020   ALKPHOS 134 (H) 10/07/2020   BILITOT 0.4 10/07/2020      7. Normocytic anemia No c/o fatigue. Is on daily iron supplement Lab Results  Component Value Date   HGB 11.7 (L) 10/07/2020       Outpatient Encounter Medications as of 01/10/2021  Medication Sig   acetaminophen (TYLENOL) 325 MG tablet Take 325-650 mg by mouth every 6 (six) hours as needed (for pain.).   amLODipine (NORVASC) 10 MG tablet Take 1 tablet (10 mg total) by mouth daily.   Blood Glucose Monitoring Suppl ([place]) w/Device KIT Use to test blood sugar twice daily. DX E11.9   cloNIDine (CATAPRES) 0.1 MG tablet Take 1  tablet (0.1 mg total) by mouth 3 (three) times daily.   Continuous Blood Gluc Receiver ([place]) DEVI 1 each by Does not apply route daily.   Continuous Blood Gluc Sensor (DEXCOM G6 SENSOR) MISC 1 each by Does not apply route every 14 (fourteen) days.   Continuous Blood Gluc Transmit (DEXCOM G6 TRANSMITTER) MISC 1 each by Does not apply route every 3 (three) months.   dapagliflozin propanediol (FARXIGA) 10 MG TABS tablet Take 1 tablet (10 mg total) by mouth daily.   ferrous sulfate 325 (65 FE) MG tablet Take 1 tablet (325 mg total) by mouth daily with breakfast.   glimepiride (AMARYL) 4 MG tablet Take 1 tablet (4 mg total) by mouth daily before breakfast.   glucose blood (ONETOUCH VERIO) test strip Use to test blood sugar twice daily. DX E11.9   insulin detemir (LEVEMIR FLEXTOUCH) 100 UNIT/ML FlexPen Inject 55 Units into the skin daily.   Insulin Pen Needle (PEN NEEDLES) 31G X 8 MM MISC Use to inject insulin daily as prescribed   OneTouch Delica Lancets 82X MISC Use to test blood sugar twice daily. DX E11.9   pantoprazole (PROTONIX) 40 MG tablet Take 1 tablet (40 mg total) by mouth 2 (two) times daily.   Plant Sterols and Stanols (CHOLEST OFF PO) Take 1 tablet by mouth daily.   [  DISCONTINUED] rosuvastatin (CRESTOR) 20 MG tablet Take 1 tablet (20 mg total) by mouth daily.   No facility-administered encounter medications on file as of 01/10/2021.    Past Surgical History:  Procedure Laterality Date   BIOPSY  04/25/2020   Procedure: BIOPSY;  Surgeon: [name], DO;  Location: [facility];  Service: Endoscopy;;  duodenum gastric esophagus   COLONOSCOPY WITH PROPOFOL N/A 04/25/2020   internal hemorrhoids, one 5 mm polyp in descending colon. Tubular adenoma. 5 year surveillance.   CYSTOSCOPY W/ URETERAL STENT PLACEMENT Bilateral 12/09/2013   Procedure: CYSTOSCOPY WITH RETROGRADE PYELOGRAM/URETERAL STENT PLACEMENT;  Surgeon: Alexis Frock, MD;  Location: [facility];  Service:  Urology;  Laterality: Bilateral;   CYSTOSCOPY WITH RETROGRADE PYELOGRAM, URETEROSCOPY AND STENT PLACEMENT Bilateral 09/30/2013   Procedure: CYSTOSCOPY WITH BILATERAL RETROGRADE PYELOGRAM, LEFT DIAGNOSTIC URETEROSCOPY AND Left ureteral stent;  Surgeon: Alexis Frock, MD;  Location: [facility];  Service: Urology;  Laterality: Bilateral;   ESOPHAGOGASTRODUODENOSCOPY (EGD) WITH PROPOFOL N/A 04/25/2020   Mildly severe candida esophagitis without bleed, s/p biopsy. Suspicion for eosinophilic esophagitis but no increased eosinophils. Gastritis. Reactive gastropathy. Negative H.pylori.  +KOH prep.    PERCUTANEOUS NEPHROLITHOTRIPSY  2005   POLYPECTOMY  04/25/2020   Procedure: POLYPECTOMY;  Surgeon: [name], DO;  Location: [facility];  Service: Endoscopy;;  colon   ROBOT ASSISTED PYELOPLASTY N/A 12/09/2013   Procedure: ROBOTIC ASSISTED BILATERAL PYELOLITHOTOMY, RIGHT  PYELOPLASTY ;  Surgeon: Alexis Frock, MD;  Location: [facility];  Service: Urology;  Laterality: N/A;    Family History  Problem Relation Age of Onset   Cancer Mother    Colon cancer Neg Hx    Pancreatitis Neg Hx     New complaints: None today  Social history: Lives by hisself. Has family that check on him several times a week.  Controlled substance contract: n/a      Review of Systems  Constitutional:  Negative for diaphoresis.  Eyes:  Negative for pain.  Respiratory:  Negative for shortness of breath.   Cardiovascular:  Negative for chest pain, palpitations and leg swelling.  Gastrointestinal:  Negative for abdominal pain.  Endocrine: Negative for polydipsia.  Skin:  Negative for rash.  Neurological:  Negative for dizziness, weakness and headaches.  Hematological:  Does not bruise/bleed easily.  All other systems reviewed and are negative.     Objective:   Physical Exam Vitals and nursing note reviewed.  Constitutional:      Appearance: Normal appearance. He is well-developed.  HENT:      Head: Normocephalic.     Nose: Nose normal.  Eyes:     Pupils: Pupils are equal, round, and reactive to light.  Neck:     Thyroid: No thyroid mass or thyromegaly.     Vascular: No carotid bruit or JVD.     Trachea: Phonation normal.  Cardiovascular:     Rate and Rhythm: Normal rate and regular rhythm.  Pulmonary:     Effort: Pulmonary effort is normal. No respiratory distress.     Breath sounds: Normal breath sounds.  Abdominal:     General: Bowel sounds are normal.     Palpations: Abdomen is soft.     Tenderness: There is no abdominal tenderness.  Musculoskeletal:        General: Normal range of motion.     Cervical back: Normal range of motion and neck supple.  Lymphadenopathy:     Cervical: No cervical adenopathy.  Skin:    General: Skin is  warm and dry.  Neurological:     Mental Status: He is alert and oriented to person, place, and time.  Psychiatric:        Behavior: Behavior normal.        Thought Content: Thought content normal.        Judgment: Judgment normal.    BP 115/66   Pulse (!) 57   Temp 98.1 F (36.7 C) (Temporal)   Resp 20   Ht _0  (1.854 m)   Wt 192 lb (87.1 kg)   SpO2 97%   BMI 25.33 kg/m        Assessment & Plan:  Mcdonald Myrick comes in today with chief complaint of Medical Management of Chronic Issues   Diagnosis and orders addressed:  1. Type 2 diabetes mellitus with diabetic polyneuropathy, with long-term current use of insulin (HCC) Increase levimir to 60u daily - Bayer DCA Hb A1c Waived - insulin detemir (LEVEMIR FLEXTOUCH) 100 UNIT/ML FlexPen; Inject 60 Units into the skin daily.  Dispense: 30 mL; Refill: 3  2. Hyperlipidemia with target LDL less than 100 Low fat diet - Lipid panel  3. Primary hypertension Low sodium diet - CBC with Differential/Platelet - CMP14+EGFR  4. Gastroesophageal reflux disease, unspecified whether esophagitis present Avoid spicy foods Do not eat 2 hours prior to bedtime  5.  Hyperkalemia Labs pending  6. Elevated LFTs Labs pending  7. Normocytic anemia Labs pending   Labs pending Health Maintenance reviewed Diet and exercise encouraged  Follow up plan: 1 months   Mary-Margaret Hassell Done, FNP

## 2021-01-10 NOTE — Patient Instructions (Signed)
https://www.diabeteseducator.org/docs/default-source/living-with-diabetes/conquering-the-grocery-store-v1.pdf?sfvrsn=4">  Carbohydrate Counting for Diabetes Mellitus, Adult Carbohydrate counting is a method of keeping track of how many carbohydrates you eat. Eating carbohydrates naturally increases the amount of sugar (glucose) in the blood. Counting how many carbohydrates you eat improves your bloodglucose control, which helps you manage your diabetes. It is important to know how many carbohydrates you can safely have in each meal. This is different for every person. A dietitian can help you make a meal plan and calculate how many carbohydrates you should have at each meal andsnack. What foods contain carbohydrates? Carbohydrates are found in the following foods: Grains, such as breads and cereals. Dried beans and soy products. Starchy vegetables, such as potatoes, peas, and corn. Fruit and fruit juices. Milk and yogurt. Sweets and snack foods, such as cake, cookies, candy, chips, and soft drinks. How do I count carbohydrates in foods? There are two ways to count carbohydrates in food. You can read food labels or learn standard serving sizes of foods. You can use either of the methods or acombination of both. Using the Nutrition Facts label The Nutrition Facts list is included on the labels of almost all packaged foods and beverages in the [place] It includes: The serving size. Information about nutrients in each serving, including the grams (g) of carbohydrate per serving. To use the Nutrition Facts: Decide how many servings you will have. Multiply the number of servings by the number of carbohydrates per serving. The resulting number is the total amount of carbohydrates that you will be having. Learning the standard serving sizes of foods When you eat carbohydrate foods that are not packaged or do not include Nutrition Facts on the label, you need to measure the servings in order to count the  amount of carbohydrates. Measure the foods that you will eat with a food scale or measuring cup, if needed. Decide how many standard-size servings you will eat. Multiply the number of servings by 15. For foods that contain carbohydrates, one serving equals 15 g of carbohydrates. For example, if you eat 2 cups or 10 oz (300 g) of strawberries, you will have eaten 2 servings and 30 g of carbohydrates (2 servings x 15 g = 30 g). For foods that have more than one food mixed, such as soups and casseroles, you must count the carbohydrates in each food that is included. The following list contains standard serving sizes of common carbohydrate-rich foods. Each of these servings has about 15 g of carbohydrates: 1 slice of bread. 1 six-inch (15 cm) tortilla. ? cup or 2 oz (53 g) cooked rice or pasta.  cup or 3 oz (85 g) cooked or canned, drained and rinsed beans or lentils.  cup or 3 oz (85 g) starchy vegetable, such as peas, corn, or squash.  cup or 4 oz (120 g) hot cereal.  cup or 3 oz (85 g) boiled or mashed potatoes, or  or 3 oz (85 g) of a large baked potato.  cup or 4 fl oz (118 mL) fruit juice. 1 cup or 8 fl oz (237 mL) milk. 1 small or 4 oz (106 g) apple.  or 2 oz (63 g) of a medium banana. 1 cup or 5 oz (150 g) strawberries. 3 cups or 1 oz (24 g) popped popcorn. What is an example of carbohydrate counting? To calculate the number of carbohydrates in this sample meal, follow the [place] below. Sample meal 3 oz (85 g) chicken breast. ? cup or 4 oz (106 g) brown rice.    cup or 3 oz (85 g) corn. 1 cup or 8 fl oz (237 mL) milk. 1 cup or 5 oz (150 g) strawberries with sugar-free whipped topping. Carbohydrate calculation Identify the foods that contain carbohydrates: Rice. Corn. Milk. Strawberries. Calculate how many servings you have of each food: 2 servings rice. 1 serving corn. 1 serving milk. 1 serving strawberries. Multiply each number of servings by 15 g: 2 servings  rice x 15 g = 30 g. 1 serving corn x 15 g = 15 g. 1 serving milk x 15 g = 15 g. 1 serving strawberries x 15 g = 15 g. Add together all of the amounts to find the total grams of carbohydrates eaten: 30 g + 15 g + 15 g + 15 g = 75 g of carbohydrates total. What are tips for following this plan? Shopping Develop a meal plan and then make a shopping list. Buy fresh and frozen vegetables, fresh and frozen fruit, dairy, eggs, beans, lentils, and whole grains. Look at food labels. Choose foods that have more fiber and less sugar. Avoid processed foods and foods with added sugars. Meal planning Aim to have the same amount of carbohydrates at each meal and for each snack time. Plan to have regular, balanced meals and snacks. Where to find more information American Diabetes Association: www.diabetes.org Centers for Disease Control and Prevention: www.cdc.gov Summary Carbohydrate counting is a method of keeping track of how many carbohydrates you eat. Eating carbohydrates naturally increases the amount of sugar (glucose) in the blood. Counting how many carbohydrates you eat improves your blood glucose control, which helps you manage your diabetes. A dietitian can help you make a meal plan and calculate how many carbohydrates you should have at each meal and snack. This information is not intended to replace advice given to you by your health care provider. Make sure you discuss any questions you have with your healthcare provider. Document Revised: 05/21/2019 Document Reviewed: 05/22/2019 Elsevier Patient Education  2021 Elsevier Inc.  

## 2021-01-17 IMAGING — CT CT ABD-PELV W/O CM
2 of 4 series · 12 of 46 positions shown, 14 images · non-contrast
Comparison: None.
COMPARISON: None.

Addendum:
CLINICAL DATA: Abdominal pain

EXAM:
CT ABDOMEN AND PELVIS without CONTRAST
TECHNIQUE: Multidetector CT imaging of the abdomen and pelvis was performed
following the standard protocol without IV contrast.

[Series 2: axial st · axial · 0.81mm/px · z∈[-423,+37]mm · 9 of 106 slices shown, 11 images]
[im 7/106  soft-tissue]
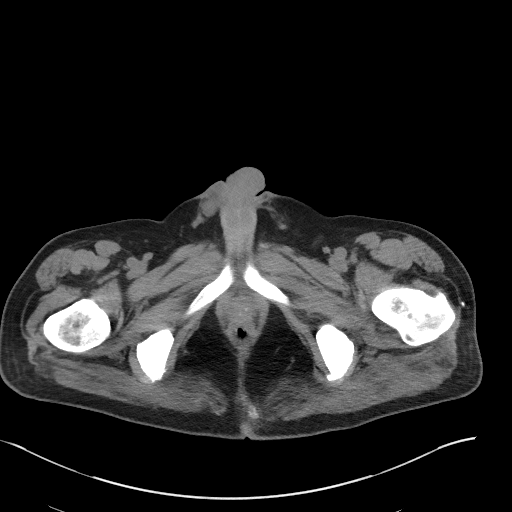
[im 7/106  bone]
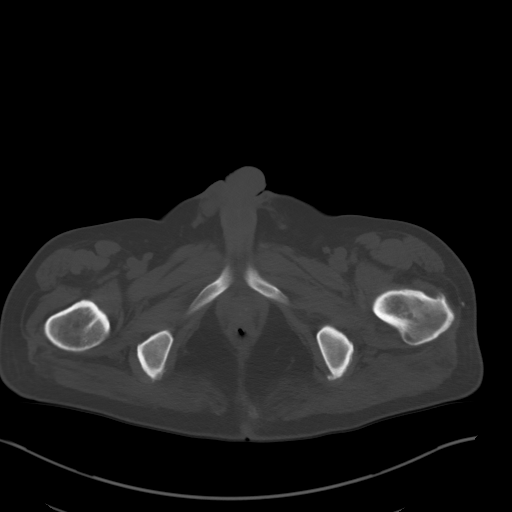
[im 20/106  soft-tissue]
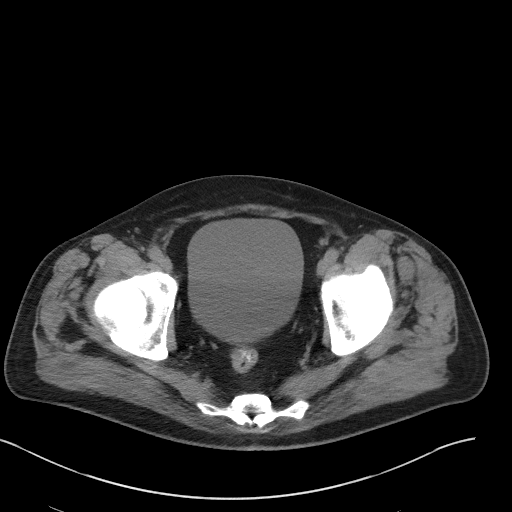
[im 33/106  soft-tissue]
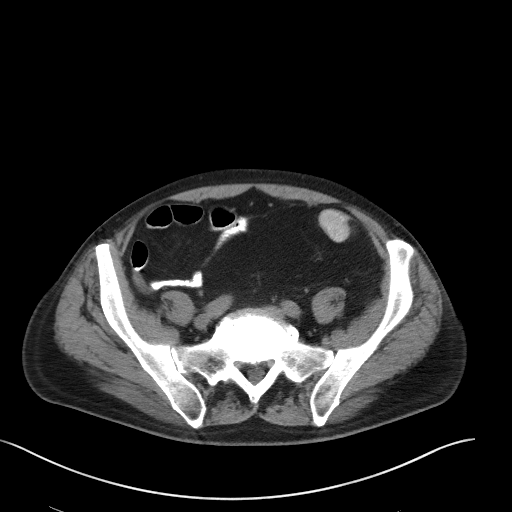
[im 40/106  soft-tissue]
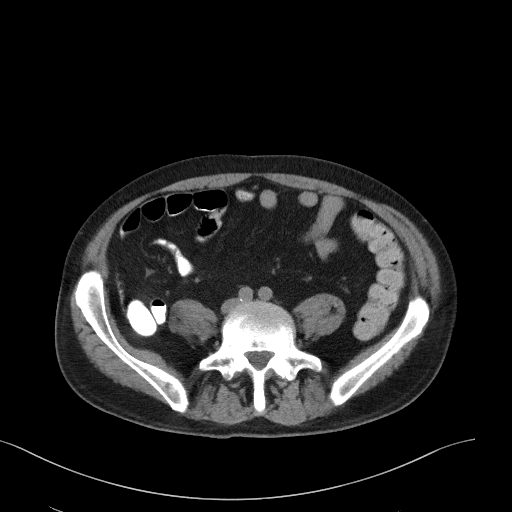
[im 53/106  soft-tissue]
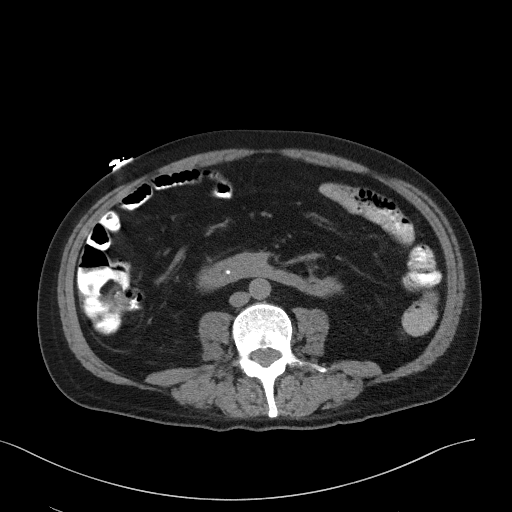
[im 66/106  soft-tissue]
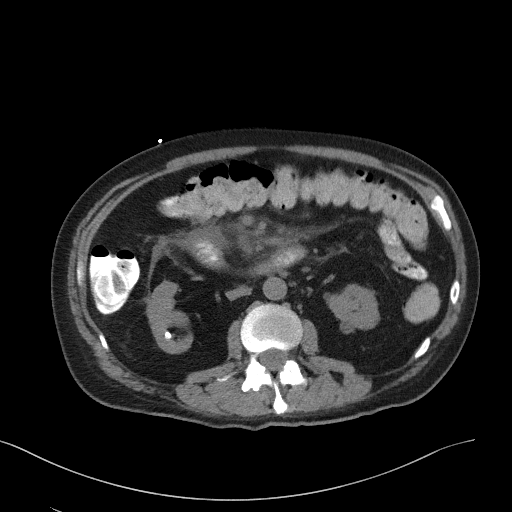
[im 73/106  soft-tissue]
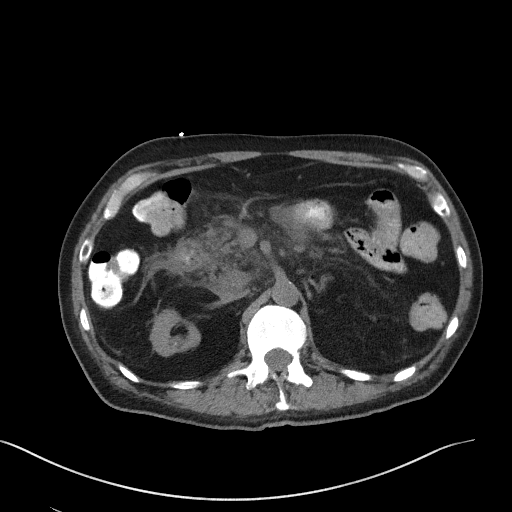
[im 86/106  soft-tissue]
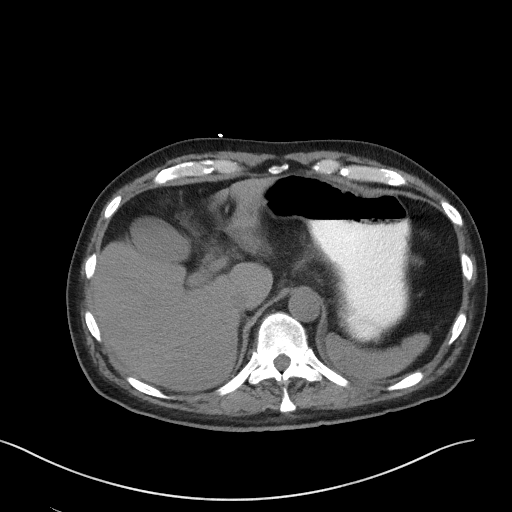
[im 99/106  soft-tissue]
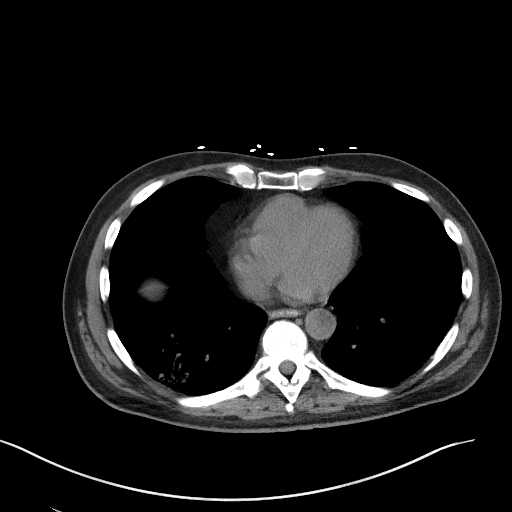
[im 99/106  bone]
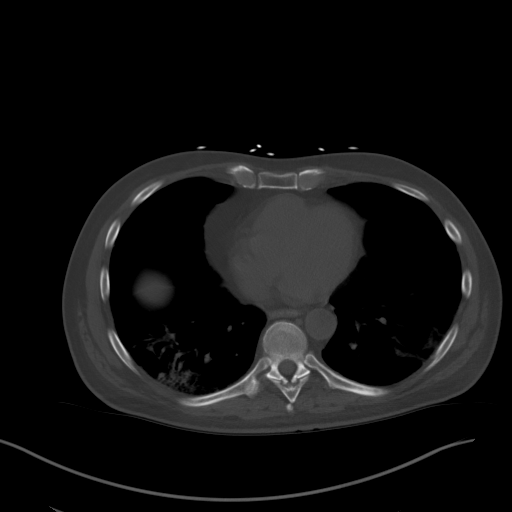

[Series 5: coronal st · coronal · 0.75mm/px · 3 of 93 slices shown]
[im 31/93  soft-tissue]
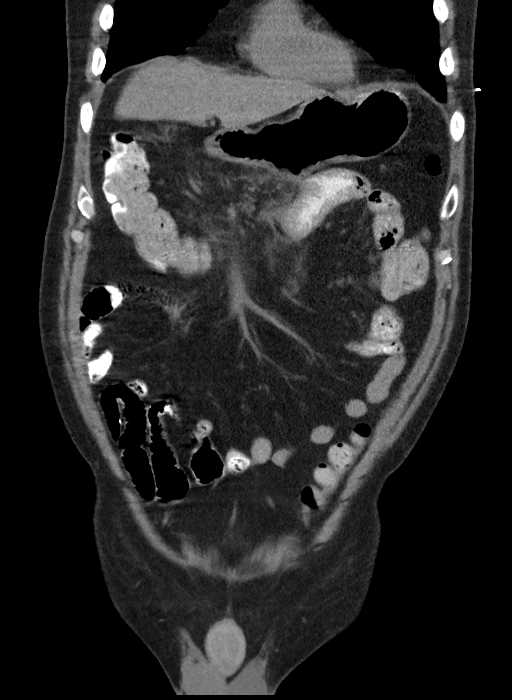
[im 41/93  soft-tissue]
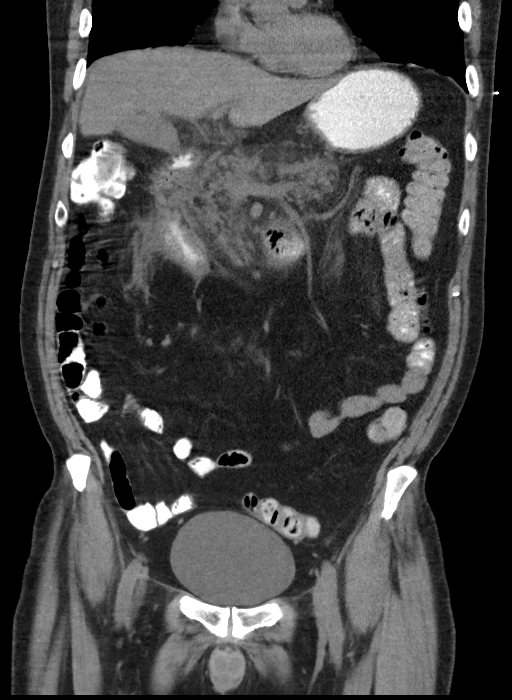
[im 52/93  soft-tissue]
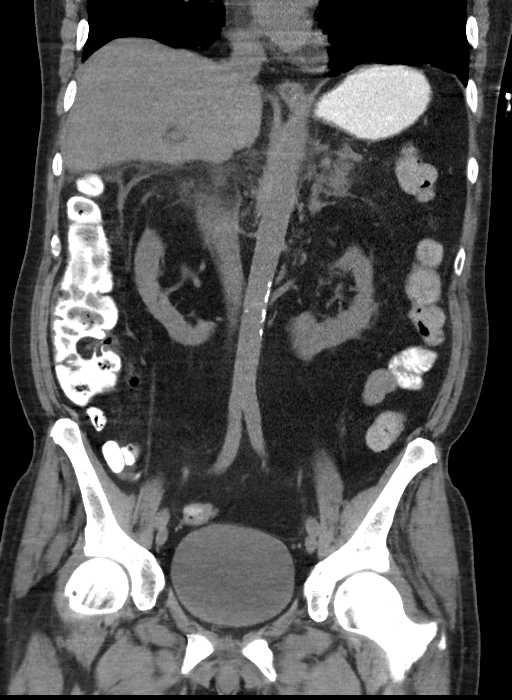

[12 of 46 positions shown; findings below may reference images not displayed]

FINDINGS: Lower chest: The visualized heart size within normal limits. No
pericardial fluid/thickening.

There is a small hiatal hernia present. Contrast is seen refluxing
within the distal esophagus.

Increased interstitial markings and patchy airspace opacity seen at
both lung bases.

Hepatobiliary: The liver is normal in density without focal
abnormality.The main portal vein is patent. No evidence of calcified
gallstones, gallbladder wall thickening or biliary dilatation.

Pancreas: There is diffuse mesenteric fat stranding changes seen
surrounding the entirety of the pancreas, predominantly around the
pancreatic head and proximal body. No definite pancreatic ductal
dilatation is seen. Common bile duct stones are seen. No loculated
fluid collections is seen. Scattered small lymph nodes are noted.
Parenchymal enhancement evaluation is limited due to lack of
intravenous contrast.

Spleen: Normal in size without focal abnormality.

Adrenals/Urinary Tract: Both adrenal glands appear normal. There is
a horseshoe kidney with fusion of the lower poles. Multiple
bilateral renal calculi are noted. The largest measuring 1.2 cm in
the upper pole the right kidney. There is mild bilateral renal
atrophy. There is a 1 cm low-density lesion seen off the upper pole
of the left kidney. No hydronephrosis. The bladder is unremarkable.

Stomach/Bowel: There is a small hiatal hernia. There is diffuse fat
stranding changes and wall thickening seen around the second and
third portion of the duodenum. The remainder of the small bowel is
unremarkable.Scattered colonic diverticula are noted without
diverticulitis. The appendix is normal in appearance.

Vascular/Lymphatic: There are no enlarged mesenteric,
retroperitoneal, or pelvic lymph nodes. Scattered aortic
atherosclerotic calcifications are seen without aneurysmal
dilatation.

Reproductive: The prostate is unremarkable.

Other: No evidence of abdominal wall mass or hernia.

Musculoskeletal: No acute or significant osseous findings.
IMPRESSION: 1. Patchy/streaky airspace opacity at both lung bases which could be
due to atelectasis or infectious etiology.
2. Findings consistent with diffuse extensive acute pancreatitis,
most notable about the proximal body and pancreatic head. No
definite loculated fluid collections or common bile duct stones.
3. Adjacent inflammatory changes involving the duodenum.
4. Multiple bilateral non-obstructing renal calculi.
5. Diverticulosis without diverticulitis.
6.  Aortic Atherosclerosis ([id number].B).

ADDENDUM:
Upon review of the images and the report, correction is made to the
body of the report.

In the section discussing the pancreas, the third sentence should be
corrected to state:

"NO common bile duct stones are seen."

This than matches the impression of the report, which indicates an
absence of common bile duct stones.

Remainder of report as previously dictated by original interpreting
radiologist.

*** End of Addendum ***
FINDINGS: Lower chest: The visualized heart size within normal limits. No
pericardial fluid/thickening.

There is a small hiatal hernia present. Contrast is seen refluxing
within the distal esophagus.

Increased interstitial markings and patchy airspace opacity seen at
both lung bases.

Hepatobiliary: The liver is normal in density without focal
abnormality.The main portal vein is patent. No evidence of calcified
gallstones, gallbladder wall thickening or biliary dilatation.

Pancreas: There is diffuse mesenteric fat stranding changes seen
surrounding the entirety of the pancreas, predominantly around the
pancreatic head and proximal body. No definite pancreatic ductal
dilatation is seen. Common bile duct stones are seen. No loculated
fluid collections is seen. Scattered small lymph nodes are noted.
Parenchymal enhancement evaluation is limited due to lack of
intravenous contrast.

Spleen: Normal in size without focal abnormality.

Adrenals/Urinary Tract: Both adrenal glands appear normal. There is
a horseshoe kidney with fusion of the lower poles. Multiple
bilateral renal calculi are noted. The largest measuring 1.2 cm in
the upper pole the right kidney. There is mild bilateral renal
atrophy. There is a 1 cm low-density lesion seen off the upper pole
of the left kidney. No hydronephrosis. The bladder is unremarkable.

Stomach/Bowel: There is a small hiatal hernia. There is diffuse fat
stranding changes and wall thickening seen around the second and
third portion of the duodenum. The remainder of the small bowel is
unremarkable.Scattered colonic diverticula are noted without
diverticulitis. The appendix is normal in appearance.

Vascular/Lymphatic: There are no enlarged mesenteric,
retroperitoneal, or pelvic lymph nodes. Scattered aortic
atherosclerotic calcifications are seen without aneurysmal
dilatation.

Reproductive: The prostate is unremarkable.

Other: No evidence of abdominal wall mass or hernia.

Musculoskeletal: No acute or significant osseous findings.
IMPRESSION: 1. Patchy/streaky airspace opacity at both lung bases which could be
due to atelectasis or infectious etiology.
2. Findings consistent with diffuse extensive acute pancreatitis,
most notable about the proximal body and pancreatic head. No
definite loculated fluid collections or common bile duct stones.
3. Adjacent inflammatory changes involving the duodenum.
4. Multiple bilateral non-obstructing renal calculi.
5. Diverticulosis without diverticulitis.
6.  Aortic Atherosclerosis ([id number]-65B.B).

## 2021-02-08 ENCOUNTER — Ambulatory Visit: Payer: Self-pay | Admitting: Nurse Practitioner

## 2021-02-14 ENCOUNTER — Encounter: Payer: Self-pay | Admitting: Nurse Practitioner

## 2021-02-14 ENCOUNTER — Other Ambulatory Visit: Payer: Self-pay

## 2021-02-14 ENCOUNTER — Other Ambulatory Visit: Payer: Self-pay | Admitting: Nurse Practitioner

## 2021-02-14 ENCOUNTER — Ambulatory Visit: Payer: BC Managed Care – PPO | Admitting: Nurse Practitioner

## 2021-02-14 VITALS — BP 138/70 | HR 58 | Temp 97.9°F | Resp 20 | Ht 73.0 in | Wt 195.0 lb

## 2021-02-14 DIAGNOSIS — E1142 Type 2 diabetes mellitus with diabetic polyneuropathy: Secondary | ICD-10-CM

## 2021-02-14 DIAGNOSIS — Z794 Long term (current) use of insulin: Secondary | ICD-10-CM

## 2021-02-14 LAB — BAYER DCA HB A1C WAIVED: HB A1C (BAYER DCA - WAIVED): 8.7 % — ABNORMAL HIGH (ref 4.8–5.6)

## 2021-02-14 NOTE — Progress Notes (Signed)
   Subjective:    Patient ID: Philip Richardson, male    DOB: 1956-01-23, 65 y.o.   MRN: [id number]   Chief Complaint: Diabetes   HPI Patient come sin today for follow up of diabetes. He has not been watching his diet very closely and does no exercise. He was last seen on 01/10/21 . His hgba1c was 9.5%. we increased levemir to 60u. He has been doing 55u. Fasting blood sugars are right at 100. But by end of day close to 250-300. He has Elenor Legato and is able to check his blood sugars frequently.    Review of Systems  Constitutional:  Negative for diaphoresis.  Eyes:  Negative for pain.  Respiratory:  Negative for shortness of breath.   Cardiovascular:  Negative for chest pain, palpitations and leg swelling.  Gastrointestinal:  Negative for abdominal pain.  Endocrine: Negative for polydipsia.  Skin:  Negative for rash.  Neurological:  Negative for dizziness, weakness and headaches.  Hematological:  Does not bruise/bleed easily.  All other systems reviewed and are negative.     Objective:   Physical Exam Vitals and nursing note reviewed.  Constitutional:      Appearance: Normal appearance.  Cardiovascular:     Rate and Rhythm: Normal rate and regular rhythm.     Heart sounds: Normal heart sounds.  Pulmonary:     Effort: Pulmonary effort is normal.     Breath sounds: Normal breath sounds.  Skin:    General: Skin is warm.  Neurological:     General: No focal deficit present.     Mental Status: He is alert and oriented to person, place, and time.     BP 138/70   Pulse (!) 58   Temp 97.9 F (36.6 C) (Temporal)   Resp 20   Ht 6\' 1"  (1.854 m)   Wt 195 lb (88.5 kg)   SpO2 99%   BMI 25.73 kg/m   HGBA1c 8.7%     Assessment & Plan:  Philip Richardson in today with chief complaint of Diabetes   1. Type 2 diabetes mellitus with diabetic polyneuropathy, with long-term current use of insulin (HCC) Continue to watch carb sin diet Increase levemir to 58 u Follow up in 1 months. -  Bayer DCA Hb A1c Waived    The above assessment and management plan was discussed with the patient. The patient verbalized understanding of and has agreed to the management plan. Patient is aware to call the clinic if symptoms persist or worsen. Patient is aware when to return to the clinic for a follow-up visit. Patient educated on when it is appropriate to go to the emergency department.   Mary-Margaret Hassell Done, FNP

## 2021-02-23 DIAGNOSIS — R0981 Nasal congestion: Secondary | ICD-10-CM | POA: Diagnosis not present

## 2021-02-23 DIAGNOSIS — R059 Cough, unspecified: Secondary | ICD-10-CM | POA: Diagnosis not present

## 2021-02-23 DIAGNOSIS — Z6825 Body mass index (BMI) 25.0-25.9, adult: Secondary | ICD-10-CM | POA: Diagnosis not present

## 2021-02-23 DIAGNOSIS — J020 Streptococcal pharyngitis: Secondary | ICD-10-CM | POA: Diagnosis not present

## 2021-03-15 DIAGNOSIS — H04123 Dry eye syndrome of bilateral lacrimal glands: Secondary | ICD-10-CM | POA: Diagnosis not present

## 2021-03-15 DIAGNOSIS — H40033 Anatomical narrow angle, bilateral: Secondary | ICD-10-CM | POA: Diagnosis not present

## 2021-03-21 ENCOUNTER — Ambulatory Visit: Payer: BC Managed Care – PPO | Admitting: Nurse Practitioner

## 2021-03-23 ENCOUNTER — Other Ambulatory Visit: Payer: Self-pay

## 2021-03-23 ENCOUNTER — Ambulatory Visit: Payer: BC Managed Care – PPO | Admitting: Nurse Practitioner

## 2021-03-23 ENCOUNTER — Encounter: Payer: Self-pay | Admitting: Nurse Practitioner

## 2021-03-23 VITALS — BP 149/84 | HR 102 | Temp 98.4°F | Ht 73.0 in | Wt 186.0 lb

## 2021-03-23 DIAGNOSIS — Z794 Long term (current) use of insulin: Secondary | ICD-10-CM

## 2021-03-23 DIAGNOSIS — E1142 Type 2 diabetes mellitus with diabetic polyneuropathy: Secondary | ICD-10-CM

## 2021-03-23 LAB — BAYER DCA HB A1C WAIVED: HB A1C (BAYER DCA - WAIVED): 9.2 % — ABNORMAL HIGH (ref 4.8–5.6)

## 2021-03-23 MED ORDER — LEVEMIR FLEXTOUCH 100 UNIT/ML ~~LOC~~ SOPN: 62.0000 [IU] | PEN_INJECTOR | Freq: Every day | SUBCUTANEOUS | 3 refills | Status: DC

## 2021-03-23 MED ORDER — FLUTICASONE PROPIONATE 50 MCG/ACT NA SUSP
2.0000 | Freq: Every day | NASAL | 6 refills | Status: DC
Start: 2021-03-23 — End: 2024-04-16

## 2021-03-23 NOTE — Progress Notes (Signed)
Subjective:    Patient ID: Philip Richardson, male    DOB: 05-02-1956, 65 y.o.   MRN: [id number]  Chief Complaint: Diabetes   HPI Patient is in today to recheck diabetes. He was last seen on 02/14/21. His hgba1c was 8.7 at that time. We increased his levimir to 58u daily. He has not been wathcin his diet.. he will not check his blood sugars. He stopped checking his blood sugars because Insurance will no longer pay for [employer]. He has not been watching his diet or exercising.   Having some allergy issues  Review of Systems  Constitutional:  Negative for diaphoresis.  Eyes:  Negative for pain.  Respiratory:  Negative for shortness of breath.   Cardiovascular:  Negative for chest pain, palpitations and leg swelling.  Gastrointestinal:  Negative for abdominal pain.  Endocrine: Negative for polydipsia.  Skin:  Negative for rash.  Neurological:  Negative for dizziness, weakness and headaches.  Hematological:  Does not bruise/bleed easily.  All other systems reviewed and are negative.     Objective:   Physical Exam Vitals and nursing note reviewed.  Constitutional:      Appearance: Normal appearance. He is well-developed.  HENT:     Head: Normocephalic.     Nose: Rhinorrhea present.  Eyes:     Pupils: Pupils are equal, round, and reactive to light.  Neck:     Thyroid: No thyroid mass or thyromegaly.     Vascular: No carotid bruit or JVD.     Trachea: Phonation normal.  Cardiovascular:     Rate and Rhythm: Normal rate and regular rhythm.  Pulmonary:     Effort: Pulmonary effort is normal. No respiratory distress.     Breath sounds: Normal breath sounds.  Abdominal:     General: Bowel sounds are normal.     Palpations: Abdomen is soft.     Tenderness: There is no abdominal tenderness.  Musculoskeletal:        General: Normal range of motion.     Cervical back: Normal range of motion and neck supple.  Lymphadenopathy:     Cervical: No cervical adenopathy.  Skin:    General:  Skin is warm and dry.  Neurological:     Mental Status: He is alert and oriented to person, place, and time.  Psychiatric:        Behavior: Behavior normal.        Thought Content: Thought content normal.        Judgment: Judgment normal.   BP (!) 149/84   Pulse (!) 102   Temp 98.4 F (36.9 C)   Ht 6\' 1"  (1.854 m)   Wt 186 lb (84.4 kg)   SpO2 100%   BMI 24.54 kg/m    Hgba1c 92%     Assessment & Plan:  Philip Richardson in today with chief complaint of Diabetes   1. Type 2 diabetes mellitus with diabetic polyneuropathy, with long-term current use of insulin (HCC) Increase levemir to 62u- can do 32 u BID or 62 u daily Strict carb counting Really need to check blood sugars daily. - Bayer DCA Hb A1c Waived  Willl have to change farxiga to invokana 300mg  daily when insurance changes  The above assessment and management plan was discussed with the patient. The patient verbalized understanding of and has agreed to the management plan. Patient is aware to call the clinic if symptoms persist or worsen. Patient is aware when to return to the clinic for a follow-up visit. Patient  educated on when it is appropriate to go to the emergency department.   Mary-Margaret Hassell Done, FNP

## 2021-04-13 ENCOUNTER — Ambulatory Visit: Payer: Self-pay | Admitting: Nurse Practitioner

## 2021-04-25 ENCOUNTER — Ambulatory Visit (INDEPENDENT_AMBULATORY_CARE_PROVIDER_SITE_OTHER): Payer: Medicare HMO | Admitting: Nurse Practitioner

## 2021-04-25 ENCOUNTER — Encounter: Payer: Self-pay | Admitting: Nurse Practitioner

## 2021-04-25 ENCOUNTER — Other Ambulatory Visit: Payer: Self-pay

## 2021-04-25 VITALS — BP 128/67 | HR 57 | Temp 97.8°F | Resp 20 | Ht 73.0 in | Wt 189.0 lb

## 2021-04-25 DIAGNOSIS — Z23 Encounter for immunization: Secondary | ICD-10-CM | POA: Diagnosis not present

## 2021-04-25 DIAGNOSIS — E1142 Type 2 diabetes mellitus with diabetic polyneuropathy: Secondary | ICD-10-CM | POA: Diagnosis not present

## 2021-04-25 DIAGNOSIS — Z794 Long term (current) use of insulin: Secondary | ICD-10-CM | POA: Diagnosis not present

## 2021-04-25 LAB — BAYER DCA HB A1C WAIVED: HB A1C (BAYER DCA - WAIVED): 9.1 % — ABNORMAL HIGH (ref 4.8–5.6)

## 2021-04-25 MED ORDER — RYBELSUS 3 MG PO TABS: 3.0000 mg | ORAL_TABLET | Freq: Every day | ORAL | 0 refills | Status: DC

## 2021-04-25 MED ORDER — LEVEMIR FLEXTOUCH 100 UNIT/ML ~~LOC~~ SOPN: 66.0000 [IU] | PEN_INJECTOR | Freq: Every day | SUBCUTANEOUS | 3 refills | Status: DC

## 2021-04-25 NOTE — Progress Notes (Signed)
   Subjective:    Patient ID: Philip Richardson, male    DOB: Aug 12, 1955, 65 y.o.   MRN: [id number]   Chief Complaint: Diabetes   HPI Patient comes in today for diabetes check only. He has been struggling with elevated blood sugars. His HGBA1c was 12.4 9 months ago and we have been working  to bring it down. His last HGBA1c was 9.1. we increased him to 62 u  daily of levimir. He is also on farxiga that will need to be changed to invokana the first of the year. He does not check his blood sugars because insurance would not pay for CGM.    Review of Systems  Constitutional:  Negative for diaphoresis.  Eyes:  Negative for pain.  Respiratory:  Negative for shortness of breath.   Cardiovascular:  Negative for chest pain, palpitations and leg swelling.  Gastrointestinal:  Negative for abdominal pain.  Endocrine: Negative for polydipsia.  Skin:  Negative for rash.  Neurological:  Negative for dizziness, weakness and headaches.  Hematological:  Does not bruise/bleed easily.  All other systems reviewed and are negative.     Objective:   Physical Exam Vitals reviewed.  Constitutional:      Appearance: Normal appearance.  Cardiovascular:     Rate and Rhythm: Normal rate and regular rhythm.     Heart sounds: Normal heart sounds.  Pulmonary:     Effort: Pulmonary effort is normal.     Breath sounds: Normal breath sounds.  Skin:    General: Skin is warm and dry.  Neurological:     General: No focal deficit present.     Mental Status: He is alert and oriented to person, place, and time.   BP 128/67   Pulse (!) 57   Temp 97.8 F (36.6 C) (Temporal)   Resp 20   Ht 6\' 1"  (1.854 m)   Wt 189 lb (85.7 kg)   SpO2 100%   BMI 24.94 kg/m   HGBA1c 9.1%       Assessment & Plan:   Philip Richardson in today with chief complaint of Diabetes   1. Type 2 diabetes mellitus with diabetic polyneuropathy, with long-term current use of insulin (HCC) Increase levemir to 66u daily Rybelsus  samples given- 3mg  1 po daily Stop farxiga- could not do other SGlt2 because of kidney function. Appointment with clinical pharmacist to review meds and see if can get CGM with new insurance - Bayer DCA Hb A1c Waived - insulin detemir (LEVEMIR FLEXTOUCH) 100 UNIT/ML FlexPen; Inject 66 Units into the skin daily.  Dispense: 50 mL; Refill: 3    The above assessment and management plan was discussed with the patient. The patient verbalized understanding of and has agreed to the management plan. Patient is aware to call the clinic if symptoms persist or worsen. Patient is aware when to return to the clinic for a follow-up visit. Patient educated on when it is appropriate to go to the emergency department.   Mary-Margaret Hassell Done, FNP

## 2021-05-01 ENCOUNTER — Other Ambulatory Visit: Payer: Self-pay

## 2021-05-01 DIAGNOSIS — E1142 Type 2 diabetes mellitus with diabetic polyneuropathy: Secondary | ICD-10-CM

## 2021-05-03 ENCOUNTER — Telehealth: Payer: Self-pay

## 2021-05-03 NOTE — Chronic Care Management (AMB) (Signed)
  Chronic Care Management   Outreach Note  05/03/2021 Name: Philip Richardson MRN: [id number] DOB: [birth year]  Philip Richardson is a 65 y.o. year old male who is a primary care patient of Chevis Pretty, [place]. I reached out to [employer] by phone today in response to a referral sent by [name]'s primary care provider.  An unsuccessful telephone outreach was attempted today. The patient was referred to the case management team for assistance with care management and care coordination.   Follow Up Plan: A HIPAA compliant phone message was left for the patient providing contact information and requesting a return call.  The care management team will reach out to the patient again over the next 3 days.  If patient returns call to provider office, please advise to call [name] at [place], [place], [place], [place] [address] Direct Dial: [phone] [email or web address] Website: [email or web address]

## 2021-05-05 NOTE — Chronic Care Management (AMB) (Signed)
  Chronic Care Management   Note  05/05/2021 Name: Philip Richardson MRN: [id number] DOB: 1955/08/03  Philip Richardson is a 65 y.o. year old male who is a primary care patient of Chevis Pretty, [place]. I reached out to [employer] by phone today in response to a referral sent by Philip Richardson's PCP.  Philip Richardson was given information about Chronic Care Management services today including:  CCM service includes personalized support from designated clinical staff supervised by his physician, including individualized plan of care and coordination with other care providers 24/7 contact phone numbers for assistance for urgent and routine care needs. Service will only be billed when office clinical staff spend 20 minutes or more in a month to coordinate care. Only one practitioner may furnish and bill the service in a calendar month. The patient may stop CCM services at any time (effective at the end of the month) by phone call to the office staff. The patient is responsible for co-pay (up to 20% after annual deductible is met) if co-pay is required by the individual health plan.   Patient agreed to services and verbal consent obtained.   Follow up plan: Telephone appointment with care management team member scheduled for:06/01/2021  Noreene Larsson, [place], [place], [place] [address] Direct Dial: [phone] [email or web address] Website: [email or web address]

## 2021-05-22 ENCOUNTER — Telehealth: Payer: Self-pay | Admitting: Nurse Practitioner

## 2021-05-22 NOTE — Telephone Encounter (Signed)
Called to schedule AWV. Unable to leave message.  °

## 2021-06-01 ENCOUNTER — Ambulatory Visit (INDEPENDENT_AMBULATORY_CARE_PROVIDER_SITE_OTHER): Payer: Medicare HMO | Admitting: Pharmacist

## 2021-06-01 DIAGNOSIS — Z794 Long term (current) use of insulin: Secondary | ICD-10-CM

## 2021-06-01 DIAGNOSIS — E785 Hyperlipidemia, unspecified: Secondary | ICD-10-CM

## 2021-06-01 NOTE — Patient Instructions (Signed)
Visit Information  Thank you for taking time to visit with me today. Please don't hesitate to contact me if I can be of assistance to you before our next scheduled telephone appointment.  Following are the goals we discussed today:  Current Barriers:  Unable to independently afford treatment regimen Unable to achieve control of T2DM  Unable to maintain control of T2DM  Pharmacist Clinical Goal(s):  patient will verbalize ability to afford treatment regimen achieve control of T2DM as evidenced by GOAL<7% maintain control of T2DM as evidenced by GOAL<7%  through collaboration with PharmD and provider.    Interventions: 1:1 collaboration with Chevis Pretty, FNP regarding development and update of comprehensive plan of care as evidenced by provider attestation and co-signature Inter-disciplinary care team collaboration (see longitudinal plan of care) Comprehensive medication review performed; medication list updated in electronic medical record  Diabetes: New goal. Uncontrolled-a1c 9.1%; current treatment: LEVEMIR 66 UNITS DAILY, FARXIGA, GLIMEPIRIDE;  Patient does not wish to start Rybelsus at this time due to side effects, however I discussed that we may have to add GLP1 at f/u if patient still not controlled Will continue farxiga--patient assistance completed via az&me patient assistance program Consider switching levemir to longer acting insulin IE TRESIBA  Patient now on humana which requires 3x insulin in order to cover [place] or dexcom (patient made aware) Current glucose readings: fasting glucose: n/a, post prandial glucose: n/a Not checking; unable to get dexcom cgm due to medicare requirements  Denies hypoglycemic/hyperglycemic symptoms Discussed meal planning options and Plate method for healthy eating Avoid sugary drinks and desserts Incorporate balanced protein, non starchy veggies, 1 serving of carbohydrate with each meal Increase water intake Increase physical  activity as able Current exercise: n/a Recommended potential GLP1 switch, insulin switch, heart healthy diet/healthy plate method to aid in sugar and lipid control Assessed patient finances. Will enroll patient in the az&me patient assistance program for farxiga   Patient Goals/Self-Care Activities patient will:  - take medications as prescribed as evidenced by patient report and record review check glucose daily, document, and provide at future appointments collaborate with provider on medication access solutions engage in dietary modifications by heart healthy diet/healthy plate method to aid in sugar and lipid control   Please call the care guide team at [phone] if you need to cancel or reschedule your appointment.   The patient verbalized understanding of instructions, educational materials, and care plan provided today and declined offer to receive copy of patient instructions, educational materials, and care plan.   Signature Regina Eck, PharmD, BCPS Clinical Pharmacist, [place]  II Phone [phone]

## 2021-06-01 NOTE — Progress Notes (Signed)
Chronic Care Management Pharmacy Note  06/01/2021 Name:  Philip Richardson MRN:  [id number] DOB:  11/07/1955  Summary: t2dm  Recommendations/Changes made from today's visit:  Diabetes: New goal. Uncontrolled-a1c 9.1%; current treatment: LEVEMIR 66 UNITS DAILY, FARXIGA, GLIMEPIRIDE;  Patient does not wish to start Rybelsus at this time due to side effects, however I discussed that we may have to add GLP1 at f/u if patient still not controlled Will continue farxiga--patient assistance completed via az&me patient assistance program Consider switching levemir to longer acting insulin IE TRESIBA  Patient now on humana which requires 3x insulin in order to cover libre or dexcom (patient made aware) Current glucose readings: fasting glucose: n/a, post prandial glucose: n/a Not checking; unable to get dexcom cgm due to medicare requirements  Denies hypoglycemic/hyperglycemic symptoms Discussed meal planning options and Plate method for healthy eating Avoid sugary drinks and desserts Incorporate balanced protein, non starchy veggies, 1 serving of carbohydrate with each meal Increase water intake Increase physical activity as able Current exercise: n/a Recommended potential GLP1 switch, insulin switch, heart healthy diet/healthy plate method to aid in sugar and lipid control Assessed patient finances. Will enroll patient in the az&me patient assistance program for farxiga   Patient Goals/Self-Care Activities patient will:  - take medications as prescribed as evidenced by patient report and record review check glucose daily, document, and provide at future appointments collaborate with provider on medication access solutions engage in dietary modifications by heart healthy diet/healthy plate method to aid in sugar and lipid control  Plan:  Subjective: Philip Richardson is an 65 y.o. year old male who is a primary patient of Chevis Pretty, [place].  The CCM team was consulted for  assistance with disease management and care coordination needs.    Engaged with patient by telephone for initial visit in response to provider referral for pharmacy case management and/or care coordination services.   Consent to Services:  The patient was given information about Chronic Care Management services, agreed to services, and gave verbal consent prior to initiation of services.  Please see initial visit note for detailed documentation.   Patient Care Team: Chevis Pretty, FNP as PCP - General (Nurse Practitioner) Lavera Guise, [facility] (Pharmacist) [name], DO as Consulting Physician (Internal Medicine)  Objective:  Lab Results  Component Value Date   CREATININE 2.83 (H) 01/10/2021   CREATININE 2.48 (H) 10/07/2020   CREATININE 2.54 (H) 06/09/2020    Lab Results  Component Value Date   HGBA1C 9.1 (H) 04/25/2021   Last diabetic Eye exam:  Lab Results  Component Value Date/Time   HMDIABEYEEXA No Retinopathy 07/29/2020 12:00 AM    Last diabetic Foot exam: No results found for: HMDIABFOOTEX      Component Value Date/Time   CHOL 201 (H) 01/10/2021 1008   TRIG 154 (H) 01/10/2021 1008   TRIG 100 04/21/2014 1455   HDL 36 (L) 01/10/2021 1008   HDL 46 04/21/2014 1455   CHOLHDL 5.6 (H) 01/10/2021 1008   LDLCALC 137 (H) 01/10/2021 1008   LDLCALC 103 (H) 01/13/2014 1342   LDLDIRECT 113 (H) 04/08/2018 1622    Hepatic Function Latest Ref Rng & Units 01/10/2021 10/07/2020 06/09/2020  Total Protein 6.0 - 8.5 g/dL 6.7 6.6 6.8  Albumin 3.8 - 4.8 g/dL 4.2 4.1 4.3  AST 0 - 40 IU/L _0 ALT 0 - 44 IU/L _1 Alk Phosphatase 44 - 121 IU/L 123(H) 134(H) 126(H)  Total Bilirubin 0.0 - 1.2 mg/dL  0.3 0.4 0.3  Bilirubin, Direct 0.00 - 0.40 mg/dL - - -    Lab Results  Component Value Date/Time   TSH 0.582 08/19/2013 02:00 PM    CBC Latest Ref Rng & Units 01/10/2021 10/07/2020 06/09/2020  WBC 3.4 - 10.8 x10E3/uL 7.9 7.5 5.7  Hemoglobin 13.0 - 17.7 g/dL 12.4(L)  11.7(L) 11.7(L)  Hematocrit 37.5 - 51.0 % 36.5(L) 36.6(L) 35.1(L)  Platelets 150 - 450 x10E3/uL 236 319 225    Lab Results  Component Value Date/Time   VD25OH 44.4 08/19/2013 02:00 PM    Clinical ASCVD: No  The 10-year ASCVD risk score (Arnett DK, et al., 2019) is: 31.5%   Values used to calculate the score:     Age: 69 years     Sex: Male     Is Non-Hispanic African American: No     Diabetic: Yes     Tobacco smoker: No     Systolic Blood Pressure: 403 mmHg     Is BP treated: Yes     HDL Cholesterol: 36 mg/dL     Total Cholesterol: 201 mg/dL    Other: (CHADS2VASc if Afib, PHQ9 if depression, MMRC or CAT for COPD, ACT, DEXA)  Social History   Tobacco Use  Smoking Status Never  Smokeless Tobacco Never   BP Readings from Last 3 Encounters:  04/25/21 128/67  03/23/21 (!) 149/84  02/14/21 138/70   Pulse Readings from Last 3 Encounters:  04/25/21 (!) 57  03/23/21 (!) 102  02/14/21 (!) 58   Wt Readings from Last 3 Encounters:  04/25/21 189 lb (85.7 kg)  03/23/21 186 lb (84.4 kg)  02/14/21 195 lb (88.5 kg)    Assessment: Review of patient past medical history, allergies, medications, health status, including review of consultants reports, laboratory and other test data, was performed as part of comprehensive evaluation and provision of chronic care management services.   SDOH:  (Social Determinants of Health) assessments and interventions performed:    CCM Care Plan  Allergies  Allergen Reactions   Atorvastatin     Myopathy/weakness   Invokana [Canagliflozin] Other (See Comments)    weakness    Medications Reviewed Today     Reviewed by Chevis Pretty, FNP (Family Nurse Practitioner) on 04/25/21 at 0847  Med List Status: <None>   Medication Order Taking? Sig Documenting Provider Last Dose Status Informant  acetaminophen (TYLENOL) 325 MG tablet [id number] Yes Take 325-650 mg by mouth every 6 (six) hours as needed (for pain.). [provider]  Taking Active Self  amLODipine (NORVASC) 10 MG tablet [id number] Yes Take 1 tablet (10 mg total) by mouth daily. Hassell Done Mary-Margaret, FNP Taking Active   Blood Glucose Monitoring Suppl ([place]) w/Device KIT [id number] Yes Use to test blood sugar twice daily. DX E11.9 Chevis Pretty, FNP Taking Active Self  cloNIDine (CATAPRES) 0.1 MG tablet [id number] Yes Take 1 tablet (0.1 mg total) by mouth 3 (three) times daily. Chevis Pretty, FNP Taking Active   Continuous Blood Gluc Receiver ([place]) DEVI [id number] Yes 1 each by Does not apply route daily. Hassell Done Mary-Margaret, FNP Taking Active   Continuous Blood Gluc Sensor (DEXCOM G6 SENSOR) MISC [id number] Yes 1 each by Does not apply route every 14 (fourteen) days. Hassell Done Mary-Margaret, FNP Taking Active   Continuous Blood Gluc Transmit (DEXCOM G6 TRANSMITTER) MISC [id number] Yes 1 each by Does not apply route every 3 (three) months. Hassell Done Mary-Margaret, FNP Taking Active   dapagliflozin propanediol (FARXIGA) 10 MG TABS tablet [id number] Yes  Take 1 tablet (10 mg total) by mouth daily. Hassell Done Mary-Margaret, FNP Taking Active   ferrous sulfate 325 (65 FE) MG tablet [id number] Yes Take 1 tablet (325 mg total) by mouth daily with breakfast. Hassell Done, Mary-Margaret, FNP Taking Active   fluticasone (FLONASE) 50 MCG/ACT nasal spray [id number] Yes Place 2 sprays into both nostrils daily. Hassell Done, Mary-Margaret, FNP Taking Active   glimepiride (AMARYL) 4 MG tablet [id number] Yes Take 1 tablet (4 mg total) by mouth daily before breakfast. Chevis Pretty, FNP Taking Active   glucose blood [facility] VERIO) test strip [id number] Yes Use to test blood sugar twice daily. DX E11.9 Chevis Pretty, FNP Taking Active Self  insulin detemir (LEVEMIR FLEXTOUCH) 100 UNIT/ML FlexPen [id number] Yes Inject 62 Units into the skin daily. Hassell Done Mary-Margaret, FNP Taking Active   Insulin Pen Needle (PEN NEEDLES) 31G X 8 MM MISC  [id number] Yes Use to inject insulin daily as prescribed Barton Dubois, MD Taking Active Self  OneTouch Delica Lancets 40X MISC [id number] Yes Use to test blood sugar twice daily. DX E11.9 Chevis Pretty, FNP Taking Active Self  pantoprazole (PROTONIX) 40 MG tablet [id number] Yes Take 1 tablet (40 mg total) by mouth 2 (two) times daily. Hassell Done Mary-Margaret, FNP Taking Active   Plant Sterols and Stanols (CHOLEST OFF PO) [id number] Yes Take 1 tablet by mouth daily. [provider] Taking Active Self            Patient Active Problem List   Diagnosis Date Noted   Drug-induced myopathy 03/14/2020   Normocytic anemia 10/09/2019   Elevated LFTs    GERD (gastroesophageal reflux disease) 07/16/2019   Hyperkalemia 07/16/2019   Constipation 07/16/2019   Hyperlipidemia with target LDL less than 100 02/01/2015   [place] kidney stones 12/09/2013   Hypertension 10/13/2013   Diabetes ([place]) 09/10/2013    Immunization History  Administered Date(s) Administered   Fluad Quad(high Dose 65+) 04/25/2021   Pneumococcal Polysaccharide-23 12/10/2013   Tdap 09/14/2010    Conditions to be addressed/monitored: HLD and DMII  Care Plan : PHARMD MEDICATION MANAGEMENT  Updates made by Lavera Guise, [place] since 06/01/2021 12:00 AM     Problem: DISEASE PROGRESSION PREVENTION      Long-Range Goal: T2DM PHARMD GOAL   This Visit's Progress: Not on track  Priority: High  Note:   Current Barriers:  Unable to independently afford treatment regimen Unable to achieve control of T2DM  Unable to maintain control of T2DM  Pharmacist Clinical Goal(s):  patient will verbalize ability to afford treatment regimen achieve control of T2DM as evidenced by GOAL<7% maintain control of T2DM as evidenced by GOAL<7%  through collaboration with PharmD and provider.    Interventions: 1:1 collaboration with Chevis Pretty, FNP regarding development and update of comprehensive plan of care as  evidenced by provider attestation and co-signature Inter-disciplinary care team collaboration (see longitudinal plan of care) Comprehensive medication review performed; medication list updated in electronic medical record  Diabetes: New goal. Uncontrolled-a1c 9.1%; current treatment: LEVEMIR 66 UNITS DAILY, FARXIGA, GLIMEPIRIDE;  Patient does not wish to start Rybelsus at this time due to side effects, however I discussed that we may have to add GLP1 at f/u if patient still not controlled Will continue farxiga--patient assistance completed via az&me patient assistance program Consider switching levemir to longer acting insulin IE TRESIBA  Patient now on humana which requires 3x insulin in order to cover [place] or dexcom (patient made aware) Current glucose readings: fasting glucose: n/a, post prandial glucose: n/a Not checking; unable to get  dexcom cgm due to medicare requirements  Denies hypoglycemic/hyperglycemic symptoms Discussed meal planning options and Plate method for healthy eating Avoid sugary drinks and desserts Incorporate balanced protein, non starchy veggies, 1 serving of carbohydrate with each meal Increase water intake Increase physical activity as able Current exercise: n/a Recommended potential GLP1 switch, insulin switch, heart healthy diet/healthy plate method to aid in sugar and lipid control Assessed patient finances. Will enroll patient in the az&me patient assistance program for farxiga   Patient Goals/Self-Care Activities patient will:  - take medications as prescribed as evidenced by patient report and record review check glucose daily, document, and provide at future appointments collaborate with provider on medication access solutions engage in dietary modifications by heart healthy diet/healthy plate method to aid in sugar and lipid control      Medication Assistance: Application for farxiga/az&me  medication assistance program. in process.  Anticipated  assistance start date tbd.  See plan of care for additional detail.  Patient's preferred pharmacy is:  [facility], [address] [place] [address] Phone: [phone] Fax: [phone]  Uses pill box? No - n/a Pt endorses 100% compliance  Follow Up:  Patient agrees to Care Plan and Follow-up.  Plan: Telephone follow up appointment with care management team member scheduled for:  3 months   Regina Eck, PharmD, BCPS Clinical Pharmacist, [place]  II Phone [phone]

## 2021-06-03 DIAGNOSIS — E119 Type 2 diabetes mellitus without complications: Secondary | ICD-10-CM

## 2021-06-03 DIAGNOSIS — Z794 Long term (current) use of insulin: Secondary | ICD-10-CM

## 2021-06-03 DIAGNOSIS — E785 Hyperlipidemia, unspecified: Secondary | ICD-10-CM

## 2021-06-28 NOTE — Progress Notes (Signed)
Received notification from AZ[employer]ME regarding approval for [facility] Patient assistance approved until 06/03/22.  Phone: [phone]

## 2021-07-06 ENCOUNTER — Other Ambulatory Visit: Payer: Self-pay

## 2021-07-06 DIAGNOSIS — Z1212 Encounter for screening for malignant neoplasm of rectum: Secondary | ICD-10-CM

## 2021-07-06 DIAGNOSIS — Z1211 Encounter for screening for malignant neoplasm of colon: Secondary | ICD-10-CM

## 2021-07-07 ENCOUNTER — Other Ambulatory Visit: Payer: Medicare HMO

## 2021-07-07 DIAGNOSIS — E1122 Type 2 diabetes mellitus with diabetic chronic kidney disease: Secondary | ICD-10-CM | POA: Diagnosis not present

## 2021-07-07 DIAGNOSIS — D638 Anemia in other chronic diseases classified elsewhere: Secondary | ICD-10-CM | POA: Diagnosis not present

## 2021-07-07 DIAGNOSIS — N189 Chronic kidney disease, unspecified: Secondary | ICD-10-CM | POA: Diagnosis not present

## 2021-07-07 DIAGNOSIS — E1129 Type 2 diabetes mellitus with other diabetic kidney complication: Secondary | ICD-10-CM | POA: Diagnosis not present

## 2021-07-07 DIAGNOSIS — I129 Hypertensive chronic kidney disease with stage 1 through stage 4 chronic kidney disease, or unspecified chronic kidney disease: Secondary | ICD-10-CM | POA: Diagnosis not present

## 2021-07-07 DIAGNOSIS — R809 Proteinuria, unspecified: Secondary | ICD-10-CM | POA: Diagnosis not present

## 2021-07-11 ENCOUNTER — Telehealth: Payer: Medicare HMO

## 2021-07-14 ENCOUNTER — Other Ambulatory Visit: Payer: Self-pay | Admitting: Nurse Practitioner

## 2021-07-14 DIAGNOSIS — R809 Proteinuria, unspecified: Secondary | ICD-10-CM | POA: Diagnosis not present

## 2021-07-14 DIAGNOSIS — E1122 Type 2 diabetes mellitus with diabetic chronic kidney disease: Secondary | ICD-10-CM | POA: Diagnosis not present

## 2021-07-14 DIAGNOSIS — N189 Chronic kidney disease, unspecified: Secondary | ICD-10-CM | POA: Diagnosis not present

## 2021-07-14 DIAGNOSIS — E1129 Type 2 diabetes mellitus with other diabetic kidney complication: Secondary | ICD-10-CM | POA: Diagnosis not present

## 2021-07-14 DIAGNOSIS — Z794 Long term (current) use of insulin: Secondary | ICD-10-CM

## 2021-07-14 DIAGNOSIS — N200 Calculus of kidney: Secondary | ICD-10-CM | POA: Diagnosis not present

## 2021-07-14 DIAGNOSIS — I129 Hypertensive chronic kidney disease with stage 1 through stage 4 chronic kidney disease, or unspecified chronic kidney disease: Secondary | ICD-10-CM | POA: Diagnosis not present

## 2021-07-27 ENCOUNTER — Encounter: Payer: Self-pay | Admitting: Internal Medicine

## 2021-08-02 ENCOUNTER — Other Ambulatory Visit: Payer: Self-pay | Admitting: Nurse Practitioner

## 2021-08-04 ENCOUNTER — Telehealth: Payer: Medicare HMO

## 2021-08-04 ENCOUNTER — Encounter: Payer: Self-pay | Admitting: Nurse Practitioner

## 2021-08-14 ENCOUNTER — Other Ambulatory Visit: Payer: Self-pay | Admitting: Nurse Practitioner

## 2021-08-14 DIAGNOSIS — I10 Essential (primary) hypertension: Secondary | ICD-10-CM

## 2021-09-11 ENCOUNTER — Telehealth: Payer: Self-pay | Admitting: Nurse Practitioner

## 2021-09-11 DIAGNOSIS — I10 Essential (primary) hypertension: Secondary | ICD-10-CM

## 2021-09-12 ENCOUNTER — Encounter: Payer: Self-pay | Admitting: Nurse Practitioner

## 2021-09-12 MED ORDER — AMLODIPINE BESYLATE 10 MG PO TABS: 10.0000 mg | ORAL_TABLET | Freq: Every day | ORAL | 0 refills | Status: DC

## 2021-09-12 NOTE — Telephone Encounter (Signed)
NTBS - 30 days given on 08/14/2021 ?

## 2021-09-12 NOTE — Addendum Note (Signed)
Addended by: Antonietta Barcelona D on: 09/12/2021 03:23 PM ? ? Modules accepted: Orders ? ?

## 2021-09-12 NOTE — Telephone Encounter (Signed)
LMTCB TO SCHEDULE APPT LETTER MAILED 

## 2021-09-12 NOTE — Telephone Encounter (Signed)
Pt r/c scheduled apt for 09/19/2021 ?

## 2021-09-19 ENCOUNTER — Encounter: Payer: Self-pay | Admitting: Nurse Practitioner

## 2021-09-19 ENCOUNTER — Ambulatory Visit (INDEPENDENT_AMBULATORY_CARE_PROVIDER_SITE_OTHER): Payer: Medicare HMO | Admitting: Nurse Practitioner

## 2021-09-19 VITALS — BP 143/80 | HR 79 | Temp 98.3°F | Resp 20 | Ht 73.0 in | Wt 195.0 lb

## 2021-09-19 DIAGNOSIS — K5901 Slow transit constipation: Secondary | ICD-10-CM | POA: Diagnosis not present

## 2021-09-19 DIAGNOSIS — E119 Type 2 diabetes mellitus without complications: Secondary | ICD-10-CM | POA: Diagnosis not present

## 2021-09-19 DIAGNOSIS — E875 Hyperkalemia: Secondary | ICD-10-CM

## 2021-09-19 DIAGNOSIS — Z0000 Encounter for general adult medical examination without abnormal findings: Secondary | ICD-10-CM

## 2021-09-19 DIAGNOSIS — K219 Gastro-esophageal reflux disease without esophagitis: Secondary | ICD-10-CM | POA: Diagnosis not present

## 2021-09-19 DIAGNOSIS — E1142 Type 2 diabetes mellitus with diabetic polyneuropathy: Secondary | ICD-10-CM

## 2021-09-19 DIAGNOSIS — D649 Anemia, unspecified: Secondary | ICD-10-CM | POA: Diagnosis not present

## 2021-09-19 DIAGNOSIS — Z794 Long term (current) use of insulin: Secondary | ICD-10-CM

## 2021-09-19 DIAGNOSIS — E785 Hyperlipidemia, unspecified: Secondary | ICD-10-CM | POA: Diagnosis not present

## 2021-09-19 DIAGNOSIS — I10 Essential (primary) hypertension: Secondary | ICD-10-CM | POA: Diagnosis not present

## 2021-09-19 DIAGNOSIS — Z125 Encounter for screening for malignant neoplasm of prostate: Secondary | ICD-10-CM | POA: Diagnosis not present

## 2021-09-19 LAB — BAYER DCA HB A1C WAIVED: HB A1C (BAYER DCA - WAIVED): 11.3 % — ABNORMAL HIGH (ref 4.8–5.6)

## 2021-09-19 MED ORDER — GLIMEPIRIDE 4 MG PO TABS: ORAL_TABLET | ORAL | 1 refills | Status: DC

## 2021-09-19 MED ORDER — AMLODIPINE BESYLATE 10 MG PO TABS: 10.0000 mg | ORAL_TABLET | Freq: Every day | ORAL | 0 refills | Status: DC

## 2021-09-19 MED ORDER — FERROUS SULFATE 325 (65 FE) MG PO TABS: 325.0000 mg | ORAL_TABLET | Freq: Every day | ORAL | 1 refills | Status: DC

## 2021-09-19 MED ORDER — CLONIDINE HCL 0.1 MG PO TABS: 0.1000 mg | ORAL_TABLET | Freq: Three times a day (TID) | ORAL | 1 refills | Status: DC

## 2021-09-19 MED ORDER — PANTOPRAZOLE SODIUM 40 MG PO TBEC: 40.0000 mg | DELAYED_RELEASE_TABLET | Freq: Two times a day (BID) | ORAL | 1 refills | Status: DC

## 2021-09-19 MED ORDER — LEVEMIR FLEXTOUCH 100 UNIT/ML ~~LOC~~ SOPN: 70.0000 [IU] | PEN_INJECTOR | Freq: Every day | SUBCUTANEOUS | 3 refills | Status: DC

## 2021-09-19 NOTE — Progress Notes (Signed)
? ?Subjective:  ? ? Patient ID: Philip Richardson, male    DOB: 1955-12-15, 66 y.o.   MRN: [id number] ? ? ?Chief Complaint: medical management of chronic issues  ?  ? ?HPI: ? ?Philip Richardson is a 66 y.o. who identifies as a male who was assigned male at birth.  ? ?Social history: ?Lives with: girlfriend ?Work history: works for [employer] ? ? ?Comes in today for follow up of the following chronic medical issues: ? ?1. Primary hypertension ?No c/o chest pain, sob or headache.does nt check blood pressure at home. ?BP Readings from Last 3 Encounters:  ?04/25/21 128/67  ?03/23/21 (!) 149/84  ?02/14/21 138/70  ? ? ? ?2. Hyperlipidemia with target LDL less than 100 ?Does try to watch diet. Does no dedicated exercise ?Lab Results  ?Component Value Date  ? CHOL 201 (H) 01/10/2021  ? HDL 36 (L) 01/10/2021  ? LDLCALC 137 (H) 01/10/2021  ? LDLDIRECT 113 (H) 04/08/2018  ? TRIG 154 (H) 01/10/2021  ? CHOLHDL 5.6 (H) 01/10/2021  ?The 10-year ASCVD risk score (Arnett DK, et al., 2019) is: 32.2% ? ? ? ?3. Type 2 diabetes mellitus without complication, with long-term current use of insulin ([place]) ?He has not been checking his blood sugars. Last visit rybellisus was added and levemir was increased to 66u daily. But he decided to stay on farxiga instead due to the expense of the medication. ?Lab Results  ?Component Value Date  ? HGBA1C 9.1 (H) 04/25/2021  ? ? ? ?4. Gastroesophageal reflux disease, unspecified whether esophagitis present ?Is on protonix daly and is doing well. ? ?5. Hyperkalemia ?Denies any muscle cramps. ?Lab Results  ?Component Value Date  ? K 4.8 01/10/2021  ? ? ? ?6. Normocytic anemia ?No c/o fatigue ?Lab Results  ?Component Value Date  ? HGB 12.4 (L) 01/10/2021  ? ? ? ?7. Slow transit constipation ?Has been doing some better. ? ? ?New complaints: ?None today ? ?Allergies  ?Allergen Reactions  ? Atorvastatin   ?  Myopathy/weakness  ? Invokana [Canagliflozin] Other (See Comments)  ?  weakness  ? ?Outpatient Encounter  Medications as of 09/19/2021  ?Medication Sig  ? acetaminophen (TYLENOL) 325 MG tablet Take 325-650 mg by mouth every 6 (six) hours as needed (for pain.).  ? amLODipine (NORVASC) 10 MG tablet Take 1 tablet (10 mg total) by mouth daily.  ? Blood Glucose Monitoring Suppl ([place]) w/Device KIT Use to test blood sugar twice daily. DX E11.9  ? cloNIDine (CATAPRES) 0.1 MG tablet Take 1 tablet (0.1 mg total) by mouth 3 (three) times daily.  ? Continuous Blood Gluc Receiver ([place]) DEVI 1 each by Does not apply route daily.  ? Continuous Blood Gluc Sensor (DEXCOM G6 SENSOR) MISC 1 each by Does not apply route every 14 (fourteen) days.  ? Continuous Blood Gluc Transmit (DEXCOM G6 TRANSMITTER) MISC 1 each by Does not apply route every 3 (three) months.  ? ferrous sulfate 325 (65 FE) MG tablet Take 1 tablet (325 mg total) by mouth daily with breakfast.  ? fluticasone (FLONASE) 50 MCG/ACT nasal spray Place 2 sprays into both nostrils daily.  ? glimepiride (AMARYL) 4 MG tablet TAKE (1) TABLET DAILY BEFORE BREAKFAST.  ? glucose blood (ONETOUCH VERIO) test strip Use to test blood sugar twice daily. DX E11.9  ? insulin detemir (LEVEMIR FLEXTOUCH) 100 UNIT/ML FlexPen Inject 66 Units into the skin daily.  ? Insulin Pen Needle (PEN NEEDLES 31GX5/16") 31G X 8 MM MISC Use to  inject insulin daily as prescribed  ? OneTouch Delica Lancets 47M MISC Use to test blood sugar twice daily. DX E11.9  ? pantoprazole (PROTONIX) 40 MG tablet Take 1 tablet (40 mg total) by mouth 2 (two) times daily.  ? Plant Sterols and Stanols (CHOLEST OFF PO) Take 1 tablet by mouth daily.  ? Semaglutide (RYBELSUS) 3 MG TABS Take 3 mg by mouth daily.  ? ?No facility-administered encounter medications on file as of 09/19/2021.  ? ? ?Past Surgical History:  ?Procedure Laterality Date  ? BIOPSY  04/25/2020  ? Procedure: BIOPSY;  Surgeon: [name], DO;  [facility];  Service: Endoscopy;;  duodenum ?gastric ?esophagus   ? COLONOSCOPY WITH PROPOFOL N/A 04/25/2020  ? internal hemorrhoids, one 5 mm polyp in descending colon. Tubular adenoma. 5 year surveillance.  ? CYSTOSCOPY W/ URETERAL STENT PLACEMENT Bilateral 12/09/2013  ? Procedure: CYSTOSCOPY WITH RETROGRADE PYELOGRAM/URETERAL STENT PLACEMENT;  Surgeon: Alexis Frock, MD;  Location: [facility];  Service: Urology;  Laterality: Bilateral;  ? CYSTOSCOPY WITH RETROGRADE PYELOGRAM, URETEROSCOPY AND STENT PLACEMENT Bilateral 09/30/2013  ? Procedure: CYSTOSCOPY WITH BILATERAL RETROGRADE PYELOGRAM, LEFT DIAGNOSTIC URETEROSCOPY AND Left ureteral stent;  Surgeon: Alexis Frock, MD;  Location: [facility];  Service: Urology;  Laterality: Bilateral;  ? ESOPHAGOGASTRODUODENOSCOPY (EGD) WITH PROPOFOL N/A 04/25/2020  ? Mildly severe candida esophagitis without bleed, s/p biopsy. Suspicion for eosinophilic esophagitis but no increased eosinophils. Gastritis. Reactive gastropathy. Negative H.pylori.  +KOH prep.   ? PERCUTANEOUS NEPHROLITHOTRIPSY  2005  ? POLYPECTOMY  04/25/2020  ? Procedure: POLYPECTOMY;  Surgeon: [name], DO;  [facility];  Service: Endoscopy;;  colon  ? ROBOT ASSISTED PYELOPLASTY N/A 12/09/2013  ? Procedure: ROBOTIC ASSISTED BILATERAL PYELOLITHOTOMY, RIGHT  PYELOPLASTY ;  Surgeon: Alexis Frock, MD;  Location: [facility];  Service: Urology;  Laterality: N/A;  ? ? ?Family History  ?Problem Relation Age of Onset  ? Cancer Mother   ? Colon cancer Neg Hx   ? Pancreatitis Neg Hx   ? ? ? ? ?Controlled substance contract: n/a ? ? ? ? ?Review of Systems  ?Constitutional:  Negative for diaphoresis.  ?Eyes:  Negative for pain.  ?Respiratory:  Negative for shortness of breath.   ?Cardiovascular:  Negative for chest pain, palpitations and leg swelling.  ?Gastrointestinal:  Negative for abdominal pain.  ?Endocrine: Negative for polydipsia.  ?Skin:  Negative for rash.  ?Neurological:  Negative for dizziness, weakness and headaches.  ?Hematological:  Does not  bruise/bleed easily.  ?All other systems reviewed and are negative. ? ?   ?Objective:  ? Physical Exam ?Vitals and nursing note reviewed.  ?Constitutional:   ?   Appearance: Normal appearance. He is well-developed.  ?HENT:  ?   Head: Normocephalic.  ?   Nose: Nose normal.  ?   Mouth/Throat:  ?   Mouth: Mucous membranes are moist.  ?   Pharynx: Oropharynx is clear.  ?Eyes:  ?   Pupils: Pupils are equal, round, and reactive to light.  ?Neck:  ?   Thyroid: No thyroid mass or thyromegaly.  ?   Vascular: No carotid bruit or JVD.  ?   Trachea: Phonation normal.  ?Cardiovascular:  ?   Rate and Rhythm: Normal rate and regular rhythm.  ?Pulmonary:  ?   Effort: Pulmonary effort is normal. No respiratory distress.  ?   Breath sounds: Normal breath sounds.  ?Abdominal:  ?   General: Bowel sounds are normal.  ?   Palpations: Abdomen is soft.  ?  Tenderness: There is no abdominal tenderness.  ?Musculoskeletal:     ?   General: Normal range of motion.  ?   Cervical back: Normal range of motion and neck supple.  ?Lymphadenopathy:  ?   Cervical: No cervical adenopathy.  ?Skin: ?   General: Skin is warm and dry.  ?Neurological:  ?   Mental Status: He is alert and oriented to person, place, and time.  ?Psychiatric:     ?   Behavior: Behavior normal.     ?   Thought Content: Thought content normal.     ?   Judgment: Judgment normal.  ?BP (!) 143/80   Pulse 79   Temp 98.3 ?F (36.8 ?C)   Resp 20   Ht _0  (1.854 m)   Wt 195 lb (88.5 kg)   SpO2 100%   BMI 25.73 kg/m?  ? ?HF+GBa1c 11.3 ? ? ?   ?Assessment & Plan:  ?Ebin Palazzi comes in today with chief complaint of Medical Management of Chronic Issues ? ? ?Diagnosis and orders addressed: ? ?1. Primary hypertension ?Low sodium diet ?- CMP14+EGFR ?- Microalbumin / creatinine urine ratio ?- amLODipine (NORVASC) 10 MG tablet; Take 1 tablet (10 mg total) by mouth daily.  Dispense: 30 tablet; Refill: 0 ?- cloNIDine (CATAPRES) 0.1 MG tablet; Take 1 tablet (0.1 mg total) by mouth 3  (three) times daily.  Dispense: 270 tablet; Refill: 1 ? ?2. Hyperlipidemia with target LDL less than 100 ?Low fat diet ?- Lipid panel ? ?3.  Type 2 diabetes mellitus with diabetic polyneuropathy, with long-term curren

## 2021-09-19 NOTE — Patient Instructions (Signed)

## 2021-09-20 LAB — CBC WITH DIFFERENTIAL/PLATELET
Basophils Absolute: 0.1 10*3/uL (ref 0.0–0.2)
Basos: 1 %
EOS (ABSOLUTE): 0.1 10*3/uL (ref 0.0–0.4)
Eos: 1 %
Hematocrit: 38.7 % (ref 37.5–51.0)
Hemoglobin: 13 g/dL (ref 13.0–17.7)
Immature Grans (Abs): 0 10*3/uL (ref 0.0–0.1)
Immature Granulocytes: 0 %
Lymphocytes Absolute: 1.8 10*3/uL (ref 0.7–3.1)
Lymphs: 27 %
MCH: 27.8 pg (ref 26.6–33.0)
MCHC: 33.6 g/dL (ref 31.5–35.7)
MCV: 83 fL (ref 79–97)
Monocytes Absolute: 0.5 10*3/uL (ref 0.1–0.9)
Monocytes: 7 %
Neutrophils Absolute: 4.1 10*3/uL (ref 1.4–7.0)
Neutrophils: 64 %
Platelets: 180 10*3/uL (ref 150–450)
RBC: 4.67 x10E6/uL (ref 4.14–5.80)
RDW: 14.4 % (ref 11.6–15.4)
WBC: 6.5 10*3/uL (ref 3.4–10.8)

## 2021-09-20 LAB — CMP14+EGFR
ALT: 20 IU/L (ref 0–44)
AST: 17 IU/L (ref 0–40)
Albumin/Globulin Ratio: 1.4 (ref 1.2–2.2)
Albumin: 3.9 g/dL (ref 3.8–4.8)
Alkaline Phosphatase: 111 IU/L (ref 44–121)
BUN/Creatinine Ratio: 11 (ref 10–24)
BUN: 27 mg/dL (ref 8–27)
Bilirubin Total: 0.3 mg/dL (ref 0.0–1.2)
CO2: 19 mmol/L — ABNORMAL LOW (ref 20–29)
Calcium: 9.4 mg/dL (ref 8.6–10.2)
Chloride: 101 mmol/L (ref 96–106)
Creatinine, Ser: 2.45 mg/dL — ABNORMAL HIGH (ref 0.76–1.27)
Globulin, Total: 2.7 g/dL (ref 1.5–4.5)
Glucose: 244 mg/dL — ABNORMAL HIGH (ref 70–99)
Potassium: 4.8 mmol/L (ref 3.5–5.2)
Sodium: 141 mmol/L (ref 134–144)
Total Protein: 6.6 g/dL (ref 6.0–8.5)
eGFR: 28 mL/min/{1.73_m2} — ABNORMAL LOW (ref >59)

## 2021-09-20 LAB — MICROALBUMIN / CREATININE URINE RATIO
Creatinine, Urine: 77.3 mg/dL
Microalb/Creat Ratio: 160 mg/g creat — ABNORMAL HIGH (ref 0–29)
Microalbumin, Urine: 123.4 ug/mL

## 2021-09-20 LAB — PSA, TOTAL AND FREE
PSA, Free Pct: 47.1 %
PSA, Free: 0.33 ng/mL
Prostate Specific Ag, Serum: 0.7 ng/mL (ref 0.0–4.0)

## 2021-09-20 LAB — LIPID PANEL
Chol/HDL Ratio: 5.5 ratio — ABNORMAL HIGH (ref 0.0–5.0)
Cholesterol, Total: 194 mg/dL (ref 100–199)
HDL: 35 mg/dL — ABNORMAL LOW (ref >39)
LDL Chol Calc (NIH): 113 mg/dL — ABNORMAL HIGH (ref 0–99)
Triglycerides: 266 mg/dL — ABNORMAL HIGH (ref 0–149)
VLDL Cholesterol Cal: 46 mg/dL — ABNORMAL HIGH (ref 5–40)

## 2021-10-05 ENCOUNTER — Ambulatory Visit (INDEPENDENT_AMBULATORY_CARE_PROVIDER_SITE_OTHER): Payer: Medicare HMO | Admitting: Internal Medicine

## 2021-10-05 ENCOUNTER — Encounter: Payer: Self-pay | Admitting: Internal Medicine

## 2021-10-05 VITALS — BP 124/70 | HR 80 | Temp 97.3°F | Ht 73.0 in | Wt 195.2 lb

## 2021-10-05 DIAGNOSIS — H40033 Anatomical narrow angle, bilateral: Secondary | ICD-10-CM | POA: Diagnosis not present

## 2021-10-05 DIAGNOSIS — R1319 Other dysphagia: Secondary | ICD-10-CM | POA: Diagnosis not present

## 2021-10-05 DIAGNOSIS — K219 Gastro-esophageal reflux disease without esophagitis: Secondary | ICD-10-CM

## 2021-10-05 DIAGNOSIS — D124 Benign neoplasm of descending colon: Secondary | ICD-10-CM | POA: Diagnosis not present

## 2021-10-05 DIAGNOSIS — H5213 Myopia, bilateral: Secondary | ICD-10-CM | POA: Diagnosis not present

## 2021-10-05 DIAGNOSIS — E119 Type 2 diabetes mellitus without complications: Secondary | ICD-10-CM | POA: Diagnosis not present

## 2021-10-05 NOTE — Patient Instructions (Signed)
I am happy to hear that you are doing well.  Continue on pantoprazole daily for your chronic reflux. ? ?We will plan on repeat colonoscopy in November 2026 for surveillance purposes given your history of polyps.  You are no longer eligible for Cologuard testing.  I would mail that back so you are not charged for it. ? ?Otherwise follow-up as needed. ? ?It was nice seeing you again today. ? ?Dr. Abbey Chatters ? ?At [facility] we value your feedback. You may receive a survey about your visit today. Please share your experience as we strive to create trusting relationships with our patients to provide genuine, compassionate, quality care. ? ?We appreciate your understanding and patience as we review any laboratory studies, imaging, and other diagnostic tests that are ordered as we care for you. Our office policy is 5 business days for review of these results, and any emergent or urgent results are addressed in a timely manner for your best interest. If you do not hear from our office in 1 week, please contact us.  ? ?We also encourage the use of MyChart, which contains your medical information for your review as well. If you are not enrolled in this feature, an access code is on this after visit summary for your convenience. Thank you for allowing [place] to be involved in your care. ? ?It was great to see you today!  I hope you have a great rest of your Spring! ? ? ? ?Philip Richardson. Abbey Chatters, D.O. ?Gastroenterology and Hepatology ?[facility] ? ?

## 2021-10-05 NOTE — Progress Notes (Signed)
? ? ?Referring Provider: Hassell Done, Mary-Margaret, * ?Primary Care Physician:  Chevis Pretty, FNP ?Primary GI:  Dr. Abbey Chatters ? ?Chief Complaint  ?Patient presents with  ? Follow-up  ?  Pt here for his one year follow up  ? ? ?HPI:   ?Philip Richardson is a 66 y.o. male who presents to clinic today for yearly follow-up visit.  History of chronic GERD well-controlled on pantoprazole 40 mg daily.  History of dysphagia.  EGD 04/25/2020 with candidal esophagitis, treated with Diflucan, dysphagia resolved.  H. pylori negative gastritis. ? ?Colonoscopy 04/25/2020 with tubular adenoma descending colon recommended 5-year recall. ? ?History of idiopathic pancreatitis in Feb 2021, MRCP for dedicated pancreatic evaluation with resolving inflammatory changes and no mass. ? ? ?Past Medical History:  ?Diagnosis Date  ? Frequency of urination   ? GERD (gastroesophageal reflux disease)   ? [place] kidney   ? BILATERAL  ? Hypertension   ? Renal calculus, bilateral   ? Type 2 diabetes mellitus ([place])   ? Urgency of urination   ? Wears dentures   ? ? ?Past Surgical History:  ?Procedure Laterality Date  ? BIOPSY  04/25/2020  ? Procedure: BIOPSY;  Surgeon: [name], DO;  [facility];  Service: Endoscopy;;  duodenum ?gastric ?esophagus  ? COLONOSCOPY WITH PROPOFOL N/A 04/25/2020  ? internal hemorrhoids, one 5 mm polyp in descending colon. Tubular adenoma. 5 year surveillance.  ? CYSTOSCOPY W/ URETERAL STENT PLACEMENT Bilateral 12/09/2013  ? Procedure: CYSTOSCOPY WITH RETROGRADE PYELOGRAM/URETERAL STENT PLACEMENT;  Surgeon: Alexis Frock, MD;  Location[facility];  Service: Urology;  Laterality: Bilateral;  ? CYSTOSCOPY WITH RETROGRADE PYELOGRAM, URETEROSCOPY AND STENT PLACEMENT Bilateral 09/30/2013  ? Procedure: CYSTOSCOPY WITH BILATERAL RETROGRADE PYELOGRAM, LEFT DIAGNOSTIC URETEROSCOPY AND Left ureteral stent;  Surgeon: Alexis Frock, MD;  Location: [facility];  Service: Urology;  Laterality:  Bilateral;  ? ESOPHAGOGASTRODUODENOSCOPY (EGD) WITH PROPOFOL N/A 04/25/2020  ? Mildly severe candida esophagitis without bleed, s/p biopsy. Suspicion for eosinophilic esophagitis but no increased eosinophils. Gastritis. Reactive gastropathy. Negative H.pylori.  +KOH prep.   ? PERCUTANEOUS NEPHROLITHOTRIPSY  2005  ? POLYPECTOMY  04/25/2020  ? Procedure: POLYPECTOMY;  Surgeon: [name], DO;  [facility];  Service: Endoscopy;;  colon  ? ROBOT ASSISTED PYELOPLASTY N/A 12/09/2013  ? Procedure: ROBOTIC ASSISTED BILATERAL PYELOLITHOTOMY, RIGHT  PYELOPLASTY ;  Surgeon: Alexis Frock, MD;  [facility]: [facility];  Service: Urology;  Laterality: N/A;  ? ? ?Current Outpatient Medications  ?Medication Sig Dispense Refill  ? amLODipine (NORVASC) 10 MG tablet Take 1 tablet (10 mg total) by mouth daily. 30 tablet 0  ? Blood Glucose Monitoring Suppl ([place]) w/Device KIT Use to test blood sugar twice daily. DX E11.9 1 kit 0  ? cloNIDine (CATAPRES) 0.1 MG tablet Take 1 tablet (0.1 mg total) by mouth 3 (three) times daily. 270 tablet 1  ? ferrous sulfate 325 (65 FE) MG tablet Take 1 tablet (325 mg total) by mouth daily with breakfast. 90 tablet 1  ? fluticasone (FLONASE) 50 MCG/ACT nasal spray Place 2 sprays into both nostrils daily. 16 g 6  ? glimepiride (AMARYL) 4 MG tablet TAKE (1) TABLET DAILY BEFORE BREAKFAST. 90 tablet 1  ? glucose blood (ONETOUCH VERIO) test strip Use to test blood sugar twice daily. DX E11.9 100 each 12  ? insulin detemir (LEVEMIR FLEXTOUCH) 100 UNIT/ML FlexPen Inject 70 Units into the skin daily. 50 mL 3  ? Insulin Pen Needle (PEN NEEDLES 31GX5/16") 31G  X 8 MM MISC Use to inject insulin daily as prescribed 150 each 5  ? OneTouch Delica Lancets 61Y MISC Use to test blood sugar twice daily. DX E11.9 100 each 3  ? pantoprazole (PROTONIX) 40 MG tablet Take 1 tablet (40 mg total) by mouth 2 (two) times daily. 180 tablet 1  ? acetaminophen (TYLENOL) 325 MG tablet Take 325-650 mg  by mouth every 6 (six) hours as needed (for pain.).    ? Plant Sterols and Stanols (CHOLEST OFF PO) Take 1 tablet by mouth daily. (Patient not taking: Reported on 10/05/2021)    ? Semaglutide (RYBELSUS) 3 MG TABS Take 3 mg by mouth daily. (Patient not taking: Reported on 10/05/2021) 30 tablet 0  ? ?No current facility-administered medications for this visit.  ? ? ?Allergies as of 10/05/2021 - Review Complete 10/05/2021  ?Allergen Reaction Noted  ? Atorvastatin  03/14/2020  ? Invokana [canagliflozin] Other (See Comments) 11/29/2015  ? ? ?Family History  ?Problem Relation Age of Onset  ? Cancer Mother   ? Colon cancer Neg Hx   ? Pancreatitis Neg Hx   ? ? ?Social History  ? ?Socioeconomic History  ? Marital status: Single  ?  Spouse name: Not on file  ? Number of children: Not on file  ? Years of education: Not on file  ? Highest education level: Not on file  ?Occupational History  ? Not on file  ?Tobacco Use  ? Smoking status: Never  ? Smokeless tobacco: Never  ?Vaping Use  ? Vaping Use: Never used  ?Substance and Sexual Activity  ? Alcohol use: No  ?  Comment: no history of etoh use  ? Drug use: No  ? Sexual activity: Not on file  ?Other Topics Concern  ? Not on file  ?Social History Narrative  ? Not on file  ? ?Social Determinants of Health  ? ?Financial Resource Strain: Not on file  ?Food Insecurity: Not on file  ?Transportation Needs: Not on file  ?Physical Activity: Not on file  ?Stress: Not on file  ?Social Connections: Not on file  ? ? ?Subjective: ?Review of Systems  ?Constitutional:  Negative for chills and fever.  ?HENT:  Negative for congestion and hearing loss.   ?Eyes:  Negative for blurred vision and double vision.  ?Respiratory:  Negative for cough and shortness of breath.   ?Cardiovascular:  Negative for chest pain and palpitations.  ?Gastrointestinal:  Positive for heartburn. Negative for abdominal pain, blood in stool, constipation, diarrhea, melena and vomiting.  ?Genitourinary:  Negative for dysuria  and urgency.  ?Musculoskeletal:  Negative for joint pain and myalgias.  ?Skin:  Negative for itching and rash.  ?Neurological:  Negative for dizziness and headaches.  ?Psychiatric/Behavioral:  Negative for depression. The patient is not nervous/anxious.   ? ? ?Objective: ?BP 124/70   Pulse 80   Temp (!) 97.3 ?F (36.3 ?C)   Ht '6\' 1"'  (1.854 m)   Wt 195 lb 3.2 oz (88.5 kg)   BMI 25.75 kg/m?  ?Physical Exam ?Constitutional:   ?   Appearance: Normal appearance.  ?HENT:  ?   Head: Normocephalic and atraumatic.  ?Eyes:  ?   Extraocular Movements: Extraocular movements intact.  ?   Conjunctiva/sclera: Conjunctivae normal.  ?Cardiovascular:  ?   Rate and Rhythm: Normal rate and regular rhythm.  ?Pulmonary:  ?   Effort: Pulmonary effort is normal.  ?   Breath sounds: Normal breath sounds.  ?Abdominal:  ?   General: Bowel sounds are normal.  ?  Palpations: Abdomen is soft.  ?Musculoskeletal:     ?   General: Normal range of motion.  ?   Cervical back: Normal range of motion and neck supple.  ?Skin: ?   General: Skin is warm.  ?Neurological:  ?   General: No focal deficit present.  ?   Mental Status: He is alert and oriented to person, place, and time.  ?Psychiatric:     ?   Mood and Affect: Mood normal.     ?   Behavior: Behavior normal.  ? ? ? ?Assessment: ?*Chronic GERD ?*Dysphagia-resolved ?*Adenomatous colon polyp ? ?Plan: ?GERD well-controlled on pantoprazole daily.  We will continue. ? ?Colonoscopy recall November 2026 for adenomatous colon polyps. ? ?Otherwise can follow-up as needed. ? ?10/05/2021 11:13 AM ? ? ?Disclaimer: This note was dictated with voice recognition software. Similar sounding words can inadvertently be transcribed and may not be corrected upon review. ? ?

## 2021-10-09 DIAGNOSIS — L570 Actinic keratosis: Secondary | ICD-10-CM | POA: Diagnosis not present

## 2021-10-09 DIAGNOSIS — X32XXXA Exposure to sunlight, initial encounter: Secondary | ICD-10-CM | POA: Diagnosis not present

## 2021-10-17 ENCOUNTER — Encounter: Payer: Self-pay | Admitting: Nurse Practitioner

## 2021-10-17 ENCOUNTER — Ambulatory Visit (INDEPENDENT_AMBULATORY_CARE_PROVIDER_SITE_OTHER): Payer: Medicare HMO | Admitting: Nurse Practitioner

## 2021-10-17 VITALS — BP 138/77 | HR 59 | Temp 98.1°F | Resp 20 | Ht 73.0 in | Wt 195.0 lb

## 2021-10-17 DIAGNOSIS — E1142 Type 2 diabetes mellitus with diabetic polyneuropathy: Secondary | ICD-10-CM

## 2021-10-17 DIAGNOSIS — Z794 Long term (current) use of insulin: Secondary | ICD-10-CM

## 2021-10-17 DIAGNOSIS — D649 Anemia, unspecified: Secondary | ICD-10-CM

## 2021-10-17 DIAGNOSIS — E119 Type 2 diabetes mellitus without complications: Secondary | ICD-10-CM | POA: Diagnosis not present

## 2021-10-17 DIAGNOSIS — I10 Essential (primary) hypertension: Secondary | ICD-10-CM | POA: Diagnosis not present

## 2021-10-17 DIAGNOSIS — K219 Gastro-esophageal reflux disease without esophagitis: Secondary | ICD-10-CM

## 2021-10-17 LAB — BAYER DCA HB A1C WAIVED: HB A1C (BAYER DCA - WAIVED): 10.9 % — ABNORMAL HIGH (ref 4.8–5.6)

## 2021-10-17 MED ORDER — LEVEMIR FLEXPEN 100 UNIT/ML ~~LOC~~ SOPN: 40.0000 [IU] | PEN_INJECTOR | Freq: Two times a day (BID) | SUBCUTANEOUS | 11 refills | Status: DC

## 2021-10-17 MED ORDER — PANTOPRAZOLE SODIUM 40 MG PO TBEC: 40.0000 mg | DELAYED_RELEASE_TABLET | Freq: Two times a day (BID) | ORAL | 1 refills | Status: DC

## 2021-10-17 MED ORDER — GLIMEPIRIDE 4 MG PO TABS: ORAL_TABLET | ORAL | 1 refills | Status: DC

## 2021-10-17 MED ORDER — FERROUS SULFATE 325 (65 FE) MG PO TABS: 325.0000 mg | ORAL_TABLET | Freq: Every day | ORAL | 1 refills | Status: DC

## 2021-10-17 MED ORDER — AMLODIPINE BESYLATE 10 MG PO TABS: 10.0000 mg | ORAL_TABLET | Freq: Every day | ORAL | 0 refills | Status: DC

## 2021-10-17 NOTE — Patient Instructions (Signed)

## 2021-10-17 NOTE — Progress Notes (Signed)
? ?Subjective:  ? ? Patient ID: Philip Richardson, male    DOB: [birth year], 66 y.o.   MRN: [id number] ? ? ?Chief Complaint: diabetes ? ?HPI ?Patient comes in today for recheck of diabetes. He has really been struggling for several months trying to get his blood sugars under control. His last hgba1c was 11.3% which was up from 9.1% in November 2022. We increased his levemir to 70u a day at last visit. He still does not check his blood sugars. Trying to watch diet and has been walking daily for exercise. ? ? ? ?Review of Systems  ?Constitutional:  Negative for diaphoresis.  ?Eyes:  Negative for pain.  ?Respiratory:  Negative for shortness of breath.   ?Cardiovascular:  Negative for chest pain, palpitations and leg swelling.  ?Gastrointestinal:  Negative for abdominal pain.  ?Endocrine: Negative for polydipsia.  ?Skin:  Negative for rash.  ?Neurological:  Negative for dizziness, weakness and headaches.  ?Hematological:  Does not bruise/bleed easily.  ?All other systems reviewed and are negative. ? ?   ?Objective:  ? Physical Exam ?Vitals and nursing note reviewed.  ?Constitutional:   ?   Appearance: Normal appearance. He is well-developed.  ?Neck:  ?   Thyroid: No thyroid mass or thyromegaly.  ?   Vascular: No carotid bruit or JVD.  ?   Trachea: Phonation normal.  ?Cardiovascular:  ?   Rate and Rhythm: Normal rate and regular rhythm.  ?Pulmonary:  ?   Effort: Pulmonary effort is normal. No respiratory distress.  ?   Breath sounds: Normal breath sounds.  ?Abdominal:  ?   General: Bowel sounds are normal.  ?   Palpations: Abdomen is soft.  ?   Tenderness: There is no abdominal tenderness.  ?Musculoskeletal:     ?   General: Normal range of motion.  ?   Cervical back: Normal range of motion and neck supple.  ?Lymphadenopathy:  ?   Cervical: No cervical adenopathy.  ?Skin: ?   General: Skin is warm and dry.  ?Neurological:  ?   Mental Status: He is alert and oriented to person, place, and time.  ?Psychiatric:     ?   Behavior:  Behavior normal.     ?   Thought Content: Thought content normal.     ?   Judgment: Judgment normal.  ? ? ?BP 138/77   Pulse (!) 59   Temp 98.1 ?F (36.7 ?C) (Temporal)   Resp 20   Ht '6\' 1"'$  (1.854 m)   Wt 195 lb (88.5 kg)   SpO2 98%   BMI 25.73 kg/m?  ? ?Hgba1c 10.9% ? ? ?   ?Assessment & Plan:  ? ?Jshaun Zandi in today with chief complaint of Diabetes ? ? ?1. Primary hypertension ?Low sodium diet ?- amLODipine (NORVASC) 10 MG tablet; Take 1 tablet (10 mg total) by mouth daily.  Dispense: 30 tablet; Refill: 0 ? ?2. Gastroesophageal reflux disease, unspecified whether esophagitis present ?Avoid spicy foods ?Do not eat 2 hours prior to bedtime ?- pantoprazole (PROTONIX) 40 MG tablet; Take 1 tablet (40 mg total) by mouth 2 (two) times daily.  Dispense: 180 tablet; Refill: 1 ? ?3. Normocytic anemia ?- ferrous sulfate 325 (65 FE) MG tablet; Take 1 tablet (325 mg total) by mouth daily with breakfast.  Dispense: 90 tablet; Refill: 1 ? ?4. Type 2 diabetes mellitus with diabetic polyneuropathy, with long-term current use of insulin ([place]) ?Changed levemir to 40u BID- start tonight ?- Bayer DCA Hb A1c Waived ?- glimepiride (AMARYL)  4 MG tablet; TAKE (1) TABLET DAILY BEFORE BREAKFAST.  Dispense: 90 tablet; Refill: 1 ?- insulin detemir (LEVEMIR FLEXPEN) 100 UNIT/ML FlexPen; Inject 40 Units into the skin 2 (two) times daily.  Dispense: 30 mL; Refill: 11 ? ? ? ?The above assessment and management plan was discussed with the patient. The patient verbalized understanding of and has agreed to the management plan. Patient is aware to call the clinic if symptoms persist or worsen. Patient is aware when to return to the clinic for a follow-up visit. Patient educated on when it is appropriate to go to the emergency department.  ? ?Mary-Margaret Hassell Done, FNP ? ? ?

## 2021-11-16 ENCOUNTER — Ambulatory Visit (INDEPENDENT_AMBULATORY_CARE_PROVIDER_SITE_OTHER): Payer: Medicare HMO | Admitting: Nurse Practitioner

## 2021-11-16 ENCOUNTER — Encounter: Payer: Self-pay | Admitting: Nurse Practitioner

## 2021-11-16 ENCOUNTER — Other Ambulatory Visit: Payer: Self-pay

## 2021-11-16 VITALS — BP 129/71 | HR 67 | Temp 98.2°F | Resp 20 | Ht 73.0 in | Wt 195.0 lb

## 2021-11-16 DIAGNOSIS — E1142 Type 2 diabetes mellitus with diabetic polyneuropathy: Secondary | ICD-10-CM

## 2021-11-16 DIAGNOSIS — Z794 Long term (current) use of insulin: Secondary | ICD-10-CM

## 2021-11-16 LAB — BAYER DCA HB A1C WAIVED: HB A1C (BAYER DCA - WAIVED): 11.8 % — ABNORMAL HIGH (ref 4.8–5.6)

## 2021-11-16 NOTE — Patient Instructions (Signed)

## 2021-11-16 NOTE — Progress Notes (Signed)
   Subjective:    Patient ID: Philip Richardson, male    DOB: Dec 15, 1955, 66 y.o.   MRN: [id number]   Chief Complaint: Diabetes   Diabetes Pertinent negatives for hypoglycemia include no dizziness or headaches. Pertinent negatives for diabetes include no chest pain, no polydipsia and no weakness.   Patient coem sin today for diabetes recheck. He was seen on 10/17/21. He has been very noncompliant with his diet and exercise. His hgba1c was 10.0. we increased levemir to 40u bid. He doe snot check his blood sugars at home.     Review of Systems  Constitutional:  Negative for diaphoresis.  Eyes:  Negative for pain.  Respiratory:  Negative for shortness of breath.   Cardiovascular:  Negative for chest pain, palpitations and leg swelling.  Gastrointestinal:  Negative for abdominal pain.  Endocrine: Negative for polydipsia.  Skin:  Negative for rash.  Neurological:  Negative for dizziness, weakness and headaches.  Hematological:  Does not bruise/bleed easily.  All other systems reviewed and are negative.      Objective:   Physical Exam Vitals reviewed.  Constitutional:      Appearance: Normal appearance.  Cardiovascular:     Rate and Rhythm: Normal rate and regular rhythm.     Heart sounds: Normal heart sounds.  Pulmonary:     Effort: Pulmonary effort is normal.     Breath sounds: Normal breath sounds.  Skin:    General: Skin is warm.  Neurological:     General: No focal deficit present.     Mental Status: He is alert and oriented to person, place, and time.  Psychiatric:        Mood and Affect: Mood normal.        Behavior: Behavior normal.    BP 129/71   Pulse 67   Temp 98.2 F (36.8 C) (Temporal)   Resp 20   Ht '6\' 1"'$  (1.854 m)   Wt 195 lb (88.5 kg)   SpO2 98%   BMI 25.73 kg/m   HGBA1c 11.8%       Assessment & Plan:  Qamar Brents in today with chief complaint of Diabetes   1. Type 2 diabetes mellitus with diabetic polyneuropathy, with long-term current  use of insulin ([place]) Needs appointment with clinical pharmacist Consider meal time insilin - Bayer DCA Hb A1c Waived Carb counting encouraged    The above assessment and management plan was discussed with the patient. The patient verbalized understanding of and has agreed to the management plan. Patient is aware to call the clinic if symptoms persist or worsen. Patient is aware when to return to the clinic for a follow-up visit. Patient educated on when it is appropriate to go to the emergency department.   Mary-Margaret Hassell Done, FNP

## 2021-11-20 DIAGNOSIS — X32XXXD Exposure to sunlight, subsequent encounter: Secondary | ICD-10-CM | POA: Diagnosis not present

## 2021-11-20 DIAGNOSIS — L570 Actinic keratosis: Secondary | ICD-10-CM | POA: Diagnosis not present

## 2021-11-20 DIAGNOSIS — C44329 Squamous cell carcinoma of skin of other parts of face: Secondary | ICD-10-CM | POA: Diagnosis not present

## 2021-12-06 ENCOUNTER — Telehealth: Payer: Self-pay | Admitting: Pharmacist

## 2021-12-06 DIAGNOSIS — Z794 Long term (current) use of insulin: Secondary | ICD-10-CM

## 2021-12-06 MED ORDER — DAPAGLIFLOZIN PROPANEDIOL 10 MG PO TABS: 10.0000 mg | ORAL_TABLET | Freq: Every day | ORAL | 5 refills | Status: DC

## 2021-12-06 NOTE — Telephone Encounter (Signed)
Refill sent to [facility] for farxiga patient assistance

## 2021-12-13 DIAGNOSIS — M25462 Effusion, left knee: Secondary | ICD-10-CM | POA: Diagnosis not present

## 2021-12-13 DIAGNOSIS — M25562 Pain in left knee: Secondary | ICD-10-CM | POA: Diagnosis not present

## 2021-12-14 ENCOUNTER — Ambulatory Visit (INDEPENDENT_AMBULATORY_CARE_PROVIDER_SITE_OTHER): Payer: Medicare HMO | Admitting: Pharmacist

## 2021-12-14 DIAGNOSIS — E119 Type 2 diabetes mellitus without complications: Secondary | ICD-10-CM

## 2021-12-14 DIAGNOSIS — G720 Drug-induced myopathy: Secondary | ICD-10-CM

## 2021-12-14 MED ORDER — TOUJEO MAX SOLOSTAR 300 UNIT/ML ~~LOC~~ SOPN: 60.0000 [IU] | PEN_INJECTOR | Freq: Every day | SUBCUTANEOUS | 5 refills | Status: DC

## 2021-12-14 NOTE — Progress Notes (Signed)
Chronic Care Management Pharmacy Note  12/14/2021 Name:  Philip Richardson MRN:  [id number] DOB:  1955/08/16  Summary:  Diabetes:  Uncontrolled-a1c 9.1%-->12%, GFR 28; current treatment: LEVEMIR 40 UNITS twice DAILY (often missing doses), FARXIGA, GLIMEPIRIDE;  Patient could not afford rybelsus Will attempt to get patient assistance via novo nordisk PAP for ozempic  Awaiting patient financials Denies personal and family history of Medullary thyroid cancer (MTC) Will continue farxiga--patient assistance completed via az&me patient assistance program Switching levemir to longer acting insulin IE Touejo--samples given-->60-80 units daily Libre CGM too expensive per patient report Current glucose readings: fasting glucose: n/a, post prandial glucose: n/a Not checking; unable to get dexcom cgm due to medicare requirements  Denies hypoglycemic/hyperglycemic symptoms Discussed meal planning options and Plate method for healthy eating Avoid sugary drinks and desserts Incorporate balanced protein, non starchy veggies, 1 serving of carbohydrate with each meal Increase water intake Increase physical activity as able Current exercise: n/a Recommended potential GLP1 switch, insulin switch, heart healthy diet/healthy plate method to aid in sugar and lipid control Assessed patient finances. Enrolled in az&me patient assistance program for farxiga; awaiting financials for ozempic PAP   Patient Goals/Self-Care Activities patient will:  - take medications as prescribed as evidenced by patient report and record review check glucose daily, document, and provide at future appointments collaborate with provider on medication access solutions engage in dietary modifications by heart healthy diet/healthy plate method to aid in sugar and lipid control   Subjective: Philip Richardson is an 66 y.o. year old male who is a primary patient of Chevis Pretty, [place].  The CCM team was consulted for  assistance with disease management and care coordination needs.    Engaged with patient by telephone for follow up visit in response to provider referral for pharmacy case management and/or care coordination services.   Consent to Services:  The patient was given information about Chronic Care Management services, agreed to services, and gave verbal consent prior to initiation of services.  Please see initial visit note for detailed documentation.   Patient Care Team: Chevis Pretty, FNP as PCP - General (Nurse Practitioner) Lavera Guise, [facility] (Pharmacist) [name], DO as Consulting Physician (Internal Medicine)  Objective:  Lab Results  Component Value Date   CREATININE 2.45 (H) 09/19/2021   CREATININE 2.83 (H) 01/10/2021   CREATININE 2.48 (H) 10/07/2020    Lab Results  Component Value Date   HGBA1C 11.8 (H) 11/16/2021   Last diabetic Eye exam:  Lab Results  Component Value Date/Time   HMDIABEYEEXA No Retinopathy 07/29/2020 12:00 AM    Last diabetic Foot exam: No results found for: "HMDIABFOOTEX"      Component Value Date/Time   CHOL 194 09/19/2021 1505   TRIG 266 (H) 09/19/2021 1505   TRIG 100 04/21/2014 1455   HDL 35 (L) 09/19/2021 1505   HDL 46 04/21/2014 1455   CHOLHDL 5.5 (H) 09/19/2021 1505   LDLCALC 113 (H) 09/19/2021 1505   LDLCALC 103 (H) 01/13/2014 1342   LDLDIRECT 113 (H) 04/08/2018 1622       Latest Ref Rng & Units 09/19/2021    3:05 PM 01/10/2021   10:08 AM 10/07/2020   11:04 AM  Hepatic Function  Total Protein 6.0 - 8.5 g/dL 6.6  6.7  6.6   Albumin 3.8 - 4.8 g/dL 3.9  4.2  4.1   AST 0 - 40 IU/L '17  12  13   ' ALT 0 - 44 IU/L 20  15  12  Alk Phosphatase 44 - 121 IU/L 111  123  134   Total Bilirubin 0.0 - 1.2 mg/dL 0.3  0.3  0.4     Lab Results  Component Value Date/Time   TSH 0.582 08/19/2013 02:00 PM       Latest Ref Rng & Units 09/19/2021    3:05 PM 01/10/2021   10:08 AM 10/07/2020   11:04 AM  CBC  WBC 3.4 - 10.8 x10E3/uL  6.5  7.9  7.5   Hemoglobin 13.0 - 17.7 g/dL 13.0  12.4  11.7   Hematocrit 37.5 - 51.0 % 38.7  36.5  36.6   Platelets 150 - 450 x10E3/uL 180  236  319     Lab Results  Component Value Date/Time   VD25OH 44.4 08/19/2013 02:00 PM    Clinical ASCVD: No  The 10-year ASCVD risk score (Arnett DK, et al., 2019) is: 31.6%   Values used to calculate the score:     Age: 66 years     Sex: Male     Is Non-Hispanic African American: No     Diabetic: Yes     Tobacco smoker: No     Systolic Blood Pressure: 081 mmHg     Is BP treated: Yes     HDL Cholesterol: 35 mg/dL     Total Cholesterol: 194 mg/dL    Other: (CHADS2VASc if Afib, PHQ9 if depression, MMRC or CAT for COPD, ACT, DEXA)  Social History   Tobacco Use  Smoking Status Never  Smokeless Tobacco Never   BP Readings from Last 3 Encounters:  11/16/21 129/71  10/17/21 138/77  10/05/21 124/70   Pulse Readings from Last 3 Encounters:  11/16/21 67  10/17/21 (!) 59  10/05/21 80   Wt Readings from Last 3 Encounters:  11/16/21 195 lb (88.5 kg)  10/17/21 195 lb (88.5 kg)  10/05/21 195 lb 3.2 oz (88.5 kg)    Assessment: Review of patient past medical history, allergies, medications, health status, including review of consultants reports, laboratory and other test data, was performed as part of comprehensive evaluation and provision of chronic care management services.   SDOH:  (Social Determinants of Health) assessments and interventions performed:    CCM Care Plan  Allergies  Allergen Reactions   Atorvastatin     Myopathy/weakness   Invokana [Canagliflozin] Other (See Comments)    weakness    Medications Reviewed Today     Reviewed by Lavera Guise, [facility] (Pharmacist) on 12/14/21 at 1338  Med List Status: <None>   Medication Order Taking? Sig Documenting Provider Last Dose Status Informant  acetaminophen (TYLENOL) 325 MG tablet [id number] No Take 325-650 mg by mouth every 6 (six) hours as needed (for pain.). [provider] Taking Active Self  amLODipine (NORVASC) 10 MG tablet [id number] No Take 1 tablet (10 mg total) by mouth daily. Hassell Done Mary-Margaret, FNP Taking Active   Blood Glucose Monitoring Suppl ([place]) w/Device KIT [id number] No Use to test blood sugar twice daily. DX E11.9 Chevis Pretty, FNP Taking Active Self  cloNIDine (CATAPRES) 0.1 MG tablet [id number] No Take 1 tablet (0.1 mg total) by mouth 3 (three) times daily. Hassell Done Mary-Margaret, FNP Taking Active   dapagliflozin propanediol (FARXIGA) 10 MG TABS tablet [id number]  Take 1 tablet (10 mg total) by mouth daily. Hassell Done, Mary-Margaret, FNP  Active   ferrous sulfate 325 (65 FE) MG tablet [id number] No Take 1 tablet (325 mg total) by mouth daily with breakfast. Chevis Pretty, FNP Taking Active  fluticasone (FLONASE) 50 MCG/ACT nasal spray [id number] No Place 2 sprays into both nostrils daily. Hassell Done, Mary-Margaret, FNP Taking Active   glimepiride (AMARYL) 4 MG tablet [id number] No TAKE (1) TABLET DAILY BEFORE BREAKFAST. Hassell Done, Mary-Margaret, FNP Taking Active   glucose blood (ONETOUCH VERIO) test strip [id number] No Use to test blood sugar twice daily. DX E11.9 Chevis Pretty, FNP Taking Active Self  insulin detemir (LEVEMIR FLEXPEN) 100 UNIT/ML FlexPen [id number] No Inject 40 Units into the skin 2 (two) times daily. Hassell Done Mary-Margaret, FNP Taking Active   Insulin Pen Needle (PEN NEEDLES 31GX5/16") 31G X 8 MM MISC [id number] No Use to inject insulin daily as prescribed Chevis Pretty, FNP Taking Active   OneTouch Delica Lancets 97W MISC [id number] No Use to test blood sugar twice daily. DX E11.9 Chevis Pretty, FNP Taking Active Self  pantoprazole (PROTONIX) 40 MG tablet [id number] No Take 1 tablet (40 mg total) by mouth 2 (two) times daily. Hassell Done, Mary-Margaret, FNP Taking Active   Semaglutide (RYBELSUS) 3 MG TABS [id number] No Take 3 mg by mouth daily. Chevis Pretty, FNP Taking  Active             Patient Active Problem List   Diagnosis Date Noted   Drug-induced myopathy 03/14/2020   Normocytic anemia 10/09/2019   Elevated LFTs    GERD (gastroesophageal reflux disease) 07/16/2019   Hyperkalemia 07/16/2019   Constipation 07/16/2019   Hyperlipidemia with target LDL less than 100 02/01/2015   Staghorn kidney stones 12/09/2013   Hypertension 10/13/2013   Diabetes ([place]) 09/10/2013    Immunization History  Administered Date(s) Administered   Fluad Quad(high Dose 65+) 04/25/2021   Pneumococcal Polysaccharide-23 12/10/2013   Tdap 09/14/2010    Conditions to be addressed/monitored: HLD and DMII  Care Plan : PHARMD MEDICATION MANAGEMENT  Updates made by Lavera Guise, [place] since 12/14/2021 12:00 AM     Problem: DISEASE PROGRESSION PREVENTION      Long-Range Goal: T2DM PHARMD GOAL   Recent Progress: Not on track  Priority: High  Note:   Current Barriers:  Unable to independently afford treatment regimen Unable to achieve control of T2DM  Unable to maintain control of T2DM  Pharmacist Clinical Goal(s):  patient will verbalize ability to afford treatment regimen achieve control of T2DM as evidenced by GOAL<7% maintain control of T2DM as evidenced by GOAL<7%  through collaboration with PharmD and provider.    Interventions: 1:1 collaboration with Chevis Pretty, FNP regarding development and update of comprehensive plan of care as evidenced by provider attestation and co-signature Inter-disciplinary care team collaboration (see longitudinal plan of care) Comprehensive medication review performed; medication list updated in electronic medical record  Diabetes:  Uncontrolled-a1c 9.1%-->12%, GFR 28; current treatment: LEVEMIR 40 UNITS twice DAILY (often missing doses), FARXIGA, GLIMEPIRIDE;  Patient could not afford rybelsus Will attempt to get patient assistance via novo nordisk PAP for ozempic  Awaiting patient financials Will continue  farxiga--patient assistance completed via az&me patient assistance program Switching levemir to longer acting insulin IE Touejo--samples given-->60-80 units daily Libre CGM too expensive per patient report Current glucose readings: fasting glucose: n/a, post prandial glucose: n/a Not checking; unable to get dexcom cgm due to medicare requirements  Denies hypoglycemic/hyperglycemic symptoms Discussed meal planning options and Plate method for healthy eating Avoid sugary drinks and desserts Incorporate balanced protein, non starchy veggies, 1 serving of carbohydrate with each meal Increase water intake Increase physical activity as able Current exercise: n/a Recommended potential GLP1 switch, insulin switch, heart healthy diet/healthy plate method to aid  in sugar and lipid control Assessed patient finances. Enrolled in az&me patient assistance program for farxiga; awaiting financials for ozempic PAP   Patient Goals/Self-Care Activities patient will:  - take medications as prescribed as evidenced by patient report and record review check glucose daily, document, and provide at future appointments collaborate with provider on medication access solutions engage in dietary modifications by heart healthy diet/healthy plate method to aid in sugar and lipid control      Medication Assistance:  farxiga obtained through az&me medication assistance program.  Enrollment ends 06/03/22 Ozempic pending  Patient's preferred pharmacy is:  [facility], [place] [address] Phone: [phone] Fax: [place], [place] [address] [place] [address] Phone: [phone] Fax: [phone]  Plan: Telephone follow up appointment with care management team member scheduled for:  2-4 weeks   Regina Eck, PharmD, BCPS Clinical Pharmacist, [place]  II Phone [phone]

## 2021-12-14 NOTE — Patient Instructions (Addendum)
Visit Information  Following are the goals we discussed today: Current Barriers:  Unable to independently afford treatment regimen Unable to achieve control of T2DM  Unable to maintain control of T2DM  Pharmacist Clinical Goal(s):  patient will verbalize ability to afford treatment regimen achieve control of T2DM as evidenced by GOAL<7% maintain control of T2DM as evidenced by GOAL<7%  through collaboration with PharmD and provider.    Interventions: 1:1 collaboration with Chevis Pretty, FNP regarding development and update of comprehensive plan of care as evidenced by provider attestation and co-signature Inter-disciplinary care team collaboration (see longitudinal plan of care) Comprehensive medication review performed; medication list updated in electronic medical record  Diabetes:  Uncontrolled-a1c 9.1%-->12%, GFR 28; current treatment: LEVEMIR 40 UNITS twice DAILY (often missing doses), FARXIGA, GLIMEPIRIDE;  Patient could not afford rybelsus Will attempt to get patient assistance via novo nordisk PAP for ozempic  Awaiting patient financials Will continue farxiga--patient assistance completed via az&me patient assistance program Switching levemir to longer acting insulin IE Touejo--samples given-->60-80 units daily Libre CGM too expensive per patient report Current glucose readings: fasting glucose: n/a, post prandial glucose: n/a Not checking; unable to get dexcom cgm due to medicare requirements  Denies hypoglycemic/hyperglycemic symptoms Discussed meal planning options and Plate method for healthy eating Avoid sugary drinks and desserts Incorporate balanced protein, non starchy veggies, 1 serving of carbohydrate with each meal Increase water intake Increase physical activity as able Current exercise: n/a Recommended potential GLP1 switch, insulin switch, heart healthy diet/healthy plate method to aid in sugar and lipid control Assessed patient finances. Enrolled in  az&me patient assistance program for farxiga; awaiting financials for ozempic PAP   Patient Goals/Self-Care Activities patient will:  - take medications as prescribed as evidenced by patient report and record review check glucose daily, document, and provide at future appointments collaborate with provider on medication access solutions engage in dietary modifications by heart healthy diet/healthy plate method to aid in sugar and lipid control  Plan: Telephone follow up appointment with care management team member scheduled for:  next week  Signature Regina Eck, PharmD, BCPS Clinical Pharmacist, [place]  II Phone [phone]   Please call the care guide team at [phone] if you need to cancel or reschedule your appointment.   The patient verbalized understanding of instructions, educational materials, and care plan provided today and DECLINED offer to receive copy of patient instructions, educational materials, and care plan.

## 2021-12-18 ENCOUNTER — Other Ambulatory Visit: Payer: Self-pay | Admitting: Nurse Practitioner

## 2021-12-18 DIAGNOSIS — M25562 Pain in left knee: Secondary | ICD-10-CM | POA: Diagnosis not present

## 2021-12-18 DIAGNOSIS — I10 Essential (primary) hypertension: Secondary | ICD-10-CM

## 2022-01-01 DIAGNOSIS — L570 Actinic keratosis: Secondary | ICD-10-CM | POA: Diagnosis not present

## 2022-01-01 DIAGNOSIS — Z794 Long term (current) use of insulin: Secondary | ICD-10-CM | POA: Diagnosis not present

## 2022-01-01 DIAGNOSIS — M25562 Pain in left knee: Secondary | ICD-10-CM | POA: Diagnosis not present

## 2022-01-01 DIAGNOSIS — Z85828 Personal history of other malignant neoplasm of skin: Secondary | ICD-10-CM | POA: Diagnosis not present

## 2022-01-01 DIAGNOSIS — E119 Type 2 diabetes mellitus without complications: Secondary | ICD-10-CM

## 2022-01-01 DIAGNOSIS — Z08 Encounter for follow-up examination after completed treatment for malignant neoplasm: Secondary | ICD-10-CM | POA: Diagnosis not present

## 2022-01-01 DIAGNOSIS — X32XXXD Exposure to sunlight, subsequent encounter: Secondary | ICD-10-CM | POA: Diagnosis not present

## 2022-01-05 ENCOUNTER — Other Ambulatory Visit: Payer: Medicare HMO

## 2022-01-05 DIAGNOSIS — I129 Hypertensive chronic kidney disease with stage 1 through stage 4 chronic kidney disease, or unspecified chronic kidney disease: Secondary | ICD-10-CM | POA: Diagnosis not present

## 2022-01-05 DIAGNOSIS — E1129 Type 2 diabetes mellitus with other diabetic kidney complication: Secondary | ICD-10-CM | POA: Diagnosis not present

## 2022-01-05 DIAGNOSIS — R809 Proteinuria, unspecified: Secondary | ICD-10-CM | POA: Diagnosis not present

## 2022-01-05 DIAGNOSIS — N200 Calculus of kidney: Secondary | ICD-10-CM | POA: Diagnosis not present

## 2022-01-05 DIAGNOSIS — N189 Chronic kidney disease, unspecified: Secondary | ICD-10-CM | POA: Diagnosis not present

## 2022-01-05 DIAGNOSIS — E1122 Type 2 diabetes mellitus with diabetic chronic kidney disease: Secondary | ICD-10-CM | POA: Diagnosis not present

## 2022-02-01 DIAGNOSIS — R809 Proteinuria, unspecified: Secondary | ICD-10-CM | POA: Diagnosis not present

## 2022-02-01 DIAGNOSIS — E1122 Type 2 diabetes mellitus with diabetic chronic kidney disease: Secondary | ICD-10-CM | POA: Diagnosis not present

## 2022-02-01 DIAGNOSIS — N189 Chronic kidney disease, unspecified: Secondary | ICD-10-CM | POA: Diagnosis not present

## 2022-02-01 DIAGNOSIS — D638 Anemia in other chronic diseases classified elsewhere: Secondary | ICD-10-CM | POA: Diagnosis not present

## 2022-02-01 DIAGNOSIS — E1129 Type 2 diabetes mellitus with other diabetic kidney complication: Secondary | ICD-10-CM | POA: Diagnosis not present

## 2022-02-01 DIAGNOSIS — Q631 Lobulated, fused and horseshoe kidney: Secondary | ICD-10-CM | POA: Diagnosis not present

## 2022-02-01 DIAGNOSIS — I129 Hypertensive chronic kidney disease with stage 1 through stage 4 chronic kidney disease, or unspecified chronic kidney disease: Secondary | ICD-10-CM | POA: Diagnosis not present

## 2022-02-01 DIAGNOSIS — N1832 Chronic kidney disease, stage 3b: Secondary | ICD-10-CM | POA: Diagnosis not present

## 2022-03-19 ENCOUNTER — Other Ambulatory Visit: Payer: Self-pay | Admitting: Nurse Practitioner

## 2022-03-19 DIAGNOSIS — I10 Essential (primary) hypertension: Secondary | ICD-10-CM

## 2022-03-23 ENCOUNTER — Telehealth: Payer: Self-pay

## 2022-03-23 NOTE — Telephone Encounter (Signed)
Received notification from [employer] regarding RE-ENROLLMENT approval for [facility] Patient assistance approved from 06/04/22 to 06/03/22.  Phone: [phone]

## 2022-04-02 ENCOUNTER — Other Ambulatory Visit: Payer: Self-pay | Admitting: Nurse Practitioner

## 2022-04-02 DIAGNOSIS — I10 Essential (primary) hypertension: Secondary | ICD-10-CM

## 2022-04-12 NOTE — Telephone Encounter (Signed)
Disregard previous note - patient ELIGIBLE for 2024 enrollment.

## 2022-04-16 DIAGNOSIS — M545 Low back pain, unspecified: Secondary | ICD-10-CM | POA: Diagnosis not present

## 2022-04-23 ENCOUNTER — Other Ambulatory Visit: Payer: Self-pay

## 2022-04-23 ENCOUNTER — Ambulatory Visit: Payer: Medicare HMO | Attending: Family Medicine

## 2022-04-23 DIAGNOSIS — M6281 Muscle weakness (generalized): Secondary | ICD-10-CM | POA: Diagnosis not present

## 2022-04-23 DIAGNOSIS — M79652 Pain in left thigh: Secondary | ICD-10-CM | POA: Insufficient documentation

## 2022-04-23 NOTE — Therapy (Signed)
OUTPATIENT PHYSICAL THERAPY THORACOLUMBAR EVALUATION   Patient Name: Philip Richardson MRN: [id number] DOB:1955/12/19, 66 y.o., male Today's Date: 04/23/2022  END OF SESSION:  PT End of Session - 04/23/22 1346     Visit Number 1    Number of Visits 8    Date for PT Re-Evaluation 05/25/22    PT Start Time 9741    PT Stop Time 1322    PT Time Calculation (min) 1413 min    Activity Tolerance Patient tolerated treatment well    Behavior During Therapy [facility] for tasks assessed/performed             Past Medical History:  Diagnosis Date   Frequency of urination    GERD (gastroesophageal reflux disease)    Horseshoe kidney    BILATERAL   Hypertension    Renal calculus, bilateral    Type 2 diabetes mellitus ([place])    Urgency of urination    Wears dentures    Past Surgical History:  Procedure Laterality Date   BIOPSY  04/25/2020   Procedure: BIOPSY;  Surgeon: [name], DO;  [facility];  Service: Endoscopy;;  duodenum gastric esophagus   COLONOSCOPY WITH PROPOFOL N/A 04/25/2020   internal hemorrhoids, one 5 mm polyp in descending colon. Tubular adenoma. 5 year surveillance.   CYSTOSCOPY W/ URETERAL STENT PLACEMENT Bilateral 12/09/2013   Procedure: CYSTOSCOPY WITH RETROGRADE PYELOGRAM/URETERAL STENT PLACEMENT;  Surgeon: Alexis Frock, MD;  Location: [facility];  Service: Urology;  Laterality: Bilateral;   CYSTOSCOPY WITH RETROGRADE PYELOGRAM, URETEROSCOPY AND STENT PLACEMENT Bilateral 09/30/2013   Procedure: CYSTOSCOPY WITH BILATERAL RETROGRADE PYELOGRAM, LEFT DIAGNOSTIC URETEROSCOPY AND Left ureteral stent;  Surgeon: Alexis Frock, MD;  Location: [facility];  Service: Urology;  Laterality: Bilateral;   ESOPHAGOGASTRODUODENOSCOPY (EGD) WITH PROPOFOL N/A 04/25/2020   Mildly severe candida esophagitis without bleed, s/p biopsy. Suspicion for eosinophilic esophagitis but no increased eosinophils. Gastritis. Reactive gastropathy. Negative H.pylori.  +KOH  prep.    PERCUTANEOUS NEPHROLITHOTRIPSY  2005   POLYPECTOMY  04/25/2020   Procedure: POLYPECTOMY;  Surgeon: [name], DO;  [facility];  Service: Endoscopy;;  colon   ROBOT ASSISTED PYELOPLASTY N/A 12/09/2013   Procedure: ROBOTIC ASSISTED BILATERAL PYELOLITHOTOMY, RIGHT  PYELOPLASTY ;  Surgeon: Alexis Frock, MD;  Location: [facility];  Service: Urology;  Laterality: N/A;   Patient Active Problem List   Diagnosis Date Noted   Drug-induced myopathy 03/14/2020   Normocytic anemia 10/09/2019   Elevated LFTs    GERD (gastroesophageal reflux disease) 07/16/2019   Hyperkalemia 07/16/2019   Constipation 07/16/2019   Hyperlipidemia with target LDL less than 100 02/01/2015   Staghorn kidney stones 12/09/2013   Hypertension 10/13/2013   Diabetes ([place]) 09/10/2013   REFERRING PROVIDER: Nuala Alpha, MD  REFERRING DIAG: Low back pain, unspecified   Rationale for Evaluation and Treatment: Rehabilitation  THERAPY DIAG:  Pain in left thigh  Muscle weakness (generalized)  ONSET DATE: about 2 years ago  SUBJECTIVE:  SUBJECTIVE STATEMENT: Patient reports that his initial injury occurred about 2 years ago when he was bowling. He notes that he planted on his left leg and felt a sharp pain in his knee. He then stopped bowling for a year. However, when he tried bowling again this year he felt a sharp pain in his left hp. This pain caused his leg to give way and fall. He has fallen two other times when his leg gave way.   PERTINENT HISTORY:  Hypertension, type 2 diabetes, myopathy  PAIN:  Are you having pain? Yes: NPRS scale: 5-6/10 Pain location: left hip and knee Pain description: sharp, sore, uncomfortable, dull Aggravating factors: sitting, walking (<5 minutes), standing (<5 minutes), or  any pressure on his left hip or leg Relieving factors: medication  PRECAUTIONS: None  WEIGHT BEARING RESTRICTIONS: No  FALLS:  Has patient fallen in last 6 months? Yes. 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   PALPATION: TTP: globally between left hip and knee, left QL, and L5-S1 spinous process  JOINT MOBILITY:  Lumbar: WFL with pain and L5-S1    LUMBAR ROM:   AROM eval  Flexion 60  Extension 28  Right lateral flexion 25% limited  Left lateral flexion 25% limited  Right rotation 25% limited  Left rotation 25% limited   (Blank rows = not tested)  LOWER EXTREMITY ROM:     Active  Right eval Left eval  Hip flexion 100 103  Hip extension    Hip abduction  15; familiar sharp pain   Hip adduction    Hip internal rotation    Hip external rotation    Knee flexion    Knee extension    Ankle dorsiflexion    Ankle plantarflexion    Ankle inversion    Ankle eversion     (Blank rows = not tested)  LOWER EXTREMITY MMT:    MMT Right eval Left eval  Hip flexion 4/5 4-/5  Hip extension    Hip abduction    Hip adduction    Hip internal rotation    Hip external rotation    Knee flexion 4+/5 3+/5  Knee extension 4-/5 3+/5  Ankle  dorsiflexion 3/5 3/5  Ankle plantarflexion    Ankle inversion    Ankle eversion     (Blank rows = not tested)  LUMBAR SPECIAL TESTS:  Straight leg raise test: Positive and FABER test: Positive  FUNCTIONAL TESTS:  5 times sit to stand: 18.85 seconds with intermittent UE support  GAIT: Assistive device utilized: None Level of assistance: Complete Independence Comments: No significant gait deviations   TODAY'S TREATMENT:  DATE:     PATIENT EDUCATION:  Education details: Plan of care, goals for therapy Person educated: Patient Education method: Explanation Education comprehension: verbalized understanding  HOME EXERCISE PROGRAM:   ASSESSMENT:  CLINICAL IMPRESSION: Patient is a 66 y.o. male who was seen today for physical therapy evaluation and treatment for chronic left hip pain.  He presented with low to moderate pain severity and irritability with left hip abduction and palpation to his left thigh being the most aggravating to his familiar symptoms.  He also exhibited reduced left lower extremity muscular strength compared to the right.  Recommend that he continue with skilled physical therapy to address his impairments to return to his prior level of function.  OBJECTIVE IMPAIRMENTS: decreased activity tolerance, decreased mobility, difficulty walking, decreased ROM, decreased strength, hypomobility, impaired tone, and pain.   ACTIVITY LIMITATIONS: sitting, standing, stairs, transfers, and locomotion level  PARTICIPATION LIMITATIONS: driving, community activity, and occupation  PERSONAL FACTORS: Time since onset of injury/illness/exacerbation and 3+ comorbidities: Hypertension, type 2 diabetes, myopathy  are also affecting patient's functional outcome.   REHAB POTENTIAL: Fair    CLINICAL DECISION MAKING: Evolving/moderate complexity  EVALUATION  COMPLEXITY: Moderate   GOALS: Goals reviewed with patient? Yes  SHORT TERM GOALS: Target date: 05/07/22  Patient will be independent with his initial HEP. Baseline: Goal status: INITIAL  2.  Patient will be able to complete his daily activities without his pain exceeding 4/10. Baseline:  Goal status: INITIAL  3.  Patient will improve his 5 time sit to stand to 15 seconds or less. Baseline:  Goal status: INITIAL  LONG TERM GOALS: Target date: 05/21/22  Patient will be independent with his advanced HEP. Baseline:  Goal status: INITIAL  2.  Patient will be able to complete his daily activities without his familiar pain exceeding 2/10. Baseline:  Goal status: INITIAL  3.  Patient will improve his 5 times sit to stand to 12 seconds or less. Baseline:  Goal status: INITIAL  4.  Patient will report being able to walk at least 15 minutes without being limited by his familiar hip pain. Baseline:  Goal status: INITIAL  PLAN:  PT FREQUENCY: 2x/week  PT DURATION: 4 weeks  PLANNED INTERVENTIONS: Therapeutic exercises, Therapeutic activity, Neuromuscular re-education, Balance training, Patient/Family education, Self Care, Joint mobilization, Stair training, Dry Needling, Electrical stimulation, Spinal mobilization, Cryotherapy, Moist heat, Vasopneumatic device, Manual therapy, and Re-evaluation.  PLAN FOR NEXT SESSION: NuStep, lower extremity strengthening, balance interventions, manual therapy, and modalities as needed   Darlin Coco, PT 04/23/2022, 2:53 PM

## 2022-04-30 ENCOUNTER — Ambulatory Visit: Payer: Medicare HMO

## 2022-04-30 DIAGNOSIS — M6281 Muscle weakness (generalized): Secondary | ICD-10-CM | POA: Diagnosis not present

## 2022-04-30 DIAGNOSIS — M79652 Pain in left thigh: Secondary | ICD-10-CM

## 2022-04-30 NOTE — Therapy (Signed)
OUTPATIENT PHYSICAL THERAPY THORACOLUMBAR TREATMENT   Patient Name: Philip Richardson MRN: [id number] DOB:Jan 16, 1956, 66 y.o., male Today's Date: 04/30/2022  END OF SESSION:  PT End of Session - 04/30/22 1347     Visit Number 2    Number of Visits 8    Date for PT Re-Evaluation 05/25/22    PT Start Time 0867    PT Stop Time 6195    PT Time Calculation (min) 43 min    Activity Tolerance Patient tolerated treatment well    Behavior During Therapy [facility] for tasks assessed/performed             Past Medical History:  Diagnosis Date   Frequency of urination    GERD (gastroesophageal reflux disease)    Horseshoe kidney    BILATERAL   Hypertension    Renal calculus, bilateral    Type 2 diabetes mellitus ([place])    Urgency of urination    Wears dentures    Past Surgical History:  Procedure Laterality Date   BIOPSY  04/25/2020   Procedure: BIOPSY;  Surgeon: [name], DO;  [facility];  Service: Endoscopy;;  duodenum gastric esophagus   COLONOSCOPY WITH PROPOFOL N/A 04/25/2020   internal hemorrhoids, one 5 mm polyp in descending colon. Tubular adenoma. 5 year surveillance.   CYSTOSCOPY W/ URETERAL STENT PLACEMENT Bilateral 12/09/2013   Procedure: CYSTOSCOPY WITH RETROGRADE PYELOGRAM/URETERAL STENT PLACEMENT;  Surgeon: Alexis Frock, MD;  Location: [facility];  Service: Urology;  Laterality: Bilateral;   CYSTOSCOPY WITH RETROGRADE PYELOGRAM, URETEROSCOPY AND STENT PLACEMENT Bilateral 09/30/2013   Procedure: CYSTOSCOPY WITH BILATERAL RETROGRADE PYELOGRAM, LEFT DIAGNOSTIC URETEROSCOPY AND Left ureteral stent;  Surgeon: Alexis Frock, MD;  Location: [facility];  Service: Urology;  Laterality: Bilateral;   ESOPHAGOGASTRODUODENOSCOPY (EGD) WITH PROPOFOL N/A 04/25/2020   Mildly severe candida esophagitis without bleed, s/p biopsy. Suspicion for eosinophilic esophagitis but no increased eosinophils. Gastritis. Reactive gastropathy. Negative H.pylori.  +KOH  prep.    PERCUTANEOUS NEPHROLITHOTRIPSY  2005   POLYPECTOMY  04/25/2020   Procedure: POLYPECTOMY;  Surgeon: [name], DO;  [facility];  Service: Endoscopy;;  colon   ROBOT ASSISTED PYELOPLASTY N/A 12/09/2013   Procedure: ROBOTIC ASSISTED BILATERAL PYELOLITHOTOMY, RIGHT  PYELOPLASTY ;  Surgeon: Alexis Frock, MD;  Location: [facility];  Service: Urology;  Laterality: N/A;   Patient Active Problem List   Diagnosis Date Noted   Drug-induced myopathy 03/14/2020   Normocytic anemia 10/09/2019   Elevated LFTs    GERD (gastroesophageal reflux disease) 07/16/2019   Hyperkalemia 07/16/2019   Constipation 07/16/2019   Hyperlipidemia with target LDL less than 100 02/01/2015   Staghorn kidney stones 12/09/2013   Hypertension 10/13/2013   Diabetes ([place]) 09/10/2013   REFERRING PROVIDER: Nuala Alpha, MD  REFERRING DIAG: Low back pain, unspecified   Rationale for Evaluation and Treatment: Rehabilitation  THERAPY DIAG:  Pain in left thigh  Muscle weakness (generalized)  ONSET DATE: about 2 years ago  SUBJECTIVE:  SUBJECTIVE STATEMENT: Patient reports that he is feeling good today and has not had any problems since his last appointment.   PERTINENT HISTORY:  Hypertension, type 2 diabetes, myopathy  PAIN:  Are you having pain? Yes: NPRS scale: 3-4/10 Pain location: left hip and knee Pain description: sharp, sore, uncomfortable, dull Aggravating factors: sitting, walking (<5 minutes), standing (<5 minutes), or any pressure on his left hip or leg Relieving factors: medication  PRECAUTIONS: None  WEIGHT BEARING RESTRICTIONS: No  FALLS:  Has patient fallen in last 6 months? Yes. 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   PALPATION: TTP: globally between left hip and knee, left QL, and L5-S1 spinous process  JOINT MOBILITY:  Lumbar: WFL with pain and L5-S1    LUMBAR ROM:   AROM eval  Flexion 60  Extension 28  Right lateral flexion 25% limited  Left lateral flexion 25% limited  Right rotation 25% limited  Left rotation 25% limited   (Blank rows = not tested)  LOWER EXTREMITY ROM:     Active  Right eval Left eval  Hip flexion 100 103  Hip extension    Hip abduction  15; familiar sharp pain   Hip adduction    Hip internal rotation    Hip external rotation    Knee flexion    Knee extension    Ankle dorsiflexion    Ankle plantarflexion    Ankle inversion    Ankle eversion     (Blank rows = not tested)  LOWER EXTREMITY MMT:    MMT Right eval Left eval  Hip flexion 4/5 4-/5  Hip extension    Hip abduction    Hip adduction    Hip internal rotation    Hip external rotation    Knee flexion 4+/5 3+/5  Knee extension 4-/5 3+/5  Ankle dorsiflexion 3/5 3/5  Ankle plantarflexion    Ankle inversion    Ankle eversion     (Blank rows = not tested)  LUMBAR SPECIAL TESTS:  Straight leg raise test: Positive and FABER test:  Positive  FUNCTIONAL TESTS:  5 times sit to stand: 18.85 seconds with intermittent UE support  GAIT: Assistive device utilized: None Level of assistance: Complete Independence Comments: No significant gait deviations   TODAY'S TREATMENT:  DATE:                                     11/27 EXERCISE LOG  Exercise Repetitions and Resistance Comments  Nustep  L4 x 15 minutes   Seated hip ADD isometric  10 reps  w/ 5 second hold   Side stepping 20 reps each    Tandem balance  3 x 30 seconds  Intermittent UE support required; slight left hip discomfort  Seated clams Green t-band x 2 minutes Added to HEP   LAQ 5 lbs x 20 reps each   Standing HS curl 5 lbs x 20 reps each    Chair taps  To elevated mat table; 20 reps   Rocker board 4 minutes Intermittent UE support required   Blank cell = exercise not performed today   PATIENT EDUCATION:  Education details: Plan of care, goals for therapy Person educated: Patient Education method: Explanation Education comprehension: verbalized understanding  HOME EXERCISE PROGRAM:   ASSESSMENT:  CLINICAL IMPRESSION: Patient was introduced to multiple new interventions for improved lower extremity strength and stability. He required minimal cueing with today's new interventions for proper biomechanics to facilitate proper muscular engagement. He experienced a mild increase in left hip discomfort with today's standing interventions. However, this did not limit his ability to complete any of today's activities. He reported feeling alright upon the conclusion of treatment. He continues to require skilled physical therapy to address his remaining impairments to return to his prior level of function.   OBJECTIVE IMPAIRMENTS: decreased activity tolerance, decreased mobility, difficulty walking, decreased ROM, decreased strength,  hypomobility, impaired tone, and pain.   ACTIVITY LIMITATIONS: sitting, standing, stairs, transfers, and locomotion level  PARTICIPATION LIMITATIONS: driving, community activity, and occupation  PERSONAL FACTORS: Time since onset of injury/illness/exacerbation and 3+ comorbidities: Hypertension, type 2 diabetes, myopathy  are also affecting patient's functional outcome.   REHAB POTENTIAL: Fair    CLINICAL DECISION MAKING: Evolving/moderate complexity  EVALUATION COMPLEXITY: Moderate   GOALS: Goals reviewed with patient? Yes  SHORT TERM GOALS: Target date: 05/07/22  Patient will be independent with his initial HEP. Baseline: Goal status: INITIAL  2.  Patient will be able to complete his daily activities without his pain exceeding 4/10. Baseline:  Goal status: INITIAL  3.  Patient will improve his 5 time sit to stand to 15 seconds or less. Baseline:  Goal status: INITIAL  LONG TERM GOALS: Target date: 05/21/22  Patient will be independent with his advanced HEP. Baseline:  Goal status: INITIAL  2.  Patient will be able to complete his daily activities without his familiar pain exceeding 2/10. Baseline:  Goal status: INITIAL  3.  Patient will improve his 5 times sit to stand to 12 seconds or less. Baseline:  Goal status: INITIAL  4.  Patient will report being able to walk at least 15 minutes without being limited by his familiar hip pain. Baseline:  Goal status: INITIAL  PLAN:  PT FREQUENCY: 2x/week  PT DURATION: 4 weeks  PLANNED INTERVENTIONS: Therapeutic exercises, Therapeutic activity, Neuromuscular re-education, Balance training, Patient/Family education, Self Care, Joint mobilization, Stair training, Dry Needling, Electrical stimulation, Spinal mobilization, Cryotherapy, Moist heat, Vasopneumatic device, Manual therapy, and Re-evaluation.  PLAN FOR NEXT SESSION: NuStep, lower extremity strengthening, balance interventions, manual therapy, and modalities as  needed   Darlin Coco, PT 04/30/2022, 2:51 PM

## 2022-05-03 ENCOUNTER — Ambulatory Visit: Payer: Medicare HMO

## 2022-05-03 DIAGNOSIS — M79652 Pain in left thigh: Secondary | ICD-10-CM

## 2022-05-03 DIAGNOSIS — M6281 Muscle weakness (generalized): Secondary | ICD-10-CM | POA: Diagnosis not present

## 2022-05-03 NOTE — Therapy (Signed)
OUTPATIENT PHYSICAL THERAPY THORACOLUMBAR TREATMENT   Patient Name: Philip Richardson MRN: [id number] DOB:09/15/1955, 66 y.o., male Today's Date: 05/03/2022  END OF SESSION:  PT End of Session - 05/03/22 1349     Visit Number 3    Number of Visits 8    Date for PT Re-Evaluation 05/25/22    PT Start Time 5102    PT Stop Time 1430    PT Time Calculation (min) 43 min    Activity Tolerance Patient tolerated treatment well    Behavior During Therapy [facility] for tasks assessed/performed             Past Medical History:  Diagnosis Date   Frequency of urination    GERD (gastroesophageal reflux disease)    Horseshoe kidney    BILATERAL   Hypertension    Renal calculus, bilateral    Type 2 diabetes mellitus ([place])    Urgency of urination    Wears dentures    Past Surgical History:  Procedure Laterality Date   BIOPSY  04/25/2020   Procedure: BIOPSY;  Surgeon: [name], DO;  [facility];  Service: Endoscopy;;  duodenum gastric esophagus   COLONOSCOPY WITH PROPOFOL N/A 04/25/2020   internal hemorrhoids, one 5 mm polyp in descending colon. Tubular adenoma. 5 year surveillance.   CYSTOSCOPY W/ URETERAL STENT PLACEMENT Bilateral 12/09/2013   Procedure: CYSTOSCOPY WITH RETROGRADE PYELOGRAM/URETERAL STENT PLACEMENT;  Surgeon: Alexis Frock, MD;  Location: [facility];  Service: Urology;  Laterality: Bilateral;   CYSTOSCOPY WITH RETROGRADE PYELOGRAM, URETEROSCOPY AND STENT PLACEMENT Bilateral 09/30/2013   Procedure: CYSTOSCOPY WITH BILATERAL RETROGRADE PYELOGRAM, LEFT DIAGNOSTIC URETEROSCOPY AND Left ureteral stent;  Surgeon: Alexis Frock, MD;  Location[facility];  Service: Urology;  Laterality: Bilateral;   ESOPHAGOGASTRODUODENOSCOPY (EGD) WITH PROPOFOL N/A 04/25/2020   Mildly severe candida esophagitis without bleed, s/p biopsy. Suspicion for eosinophilic esophagitis but no increased eosinophils. Gastritis. Reactive gastropathy. Negative H.pylori.  +KOH  prep.    PERCUTANEOUS NEPHROLITHOTRIPSY  2005   POLYPECTOMY  04/25/2020   Procedure: POLYPECTOMY;  Surgeon: [name], DO;  [facility];  Service: Endoscopy;;  colon   ROBOT ASSISTED PYELOPLASTY N/A 12/09/2013   Procedure: ROBOTIC ASSISTED BILATERAL PYELOLITHOTOMY, RIGHT  PYELOPLASTY ;  Surgeon: Alexis Frock, MD;  Location[facility];  Service: Urology;  Laterality: N/A;   Patient Active Problem List   Diagnosis Date Noted   Drug-induced myopathy 03/14/2020   Normocytic anemia 10/09/2019   Elevated LFTs    GERD (gastroesophageal reflux disease) 07/16/2019   Hyperkalemia 07/16/2019   Constipation 07/16/2019   Hyperlipidemia with target LDL less than 100 02/01/2015   Staghorn kidney stones 12/09/2013   Hypertension 10/13/2013   Diabetes ([place]) 09/10/2013   REFERRING PROVIDER: Nuala Alpha, MD  REFERRING DIAG: Low back pain, unspecified   Rationale for Evaluation and Treatment: Rehabilitation  THERAPY DIAG:  Pain in left thigh  Muscle weakness (generalized)  ONSET DATE: about 2 years ago  SUBJECTIVE:  SUBJECTIVE STATEMENT: Patient reports that he was a little sore after his last appointment, but it did not last long. He also experienced slight pain with his HEP this morning, but it did not exceed a 1/10.  PERTINENT HISTORY:  Hypertension, type 2 diabetes, myopathy  PAIN:  Are you having pain? Yes: NPRS scale: 1/10 Pain location: left hip and knee Pain description: sharp, sore, uncomfortable, dull Aggravating factors: sitting, walking (<5 minutes), standing (<5 minutes), or any pressure on his left hip or leg Relieving factors: medication  PRECAUTIONS: None  WEIGHT BEARING RESTRICTIONS: No  FALLS:  Has patient fallen in last 6 months? Yes. 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   PALPATION: TTP: globally between left hip and knee, left QL, and L5-S1 spinous process  JOINT MOBILITY:  Lumbar: WFL with pain and L5-S1    LUMBAR ROM:   AROM eval  Flexion 60  Extension 28  Right lateral flexion 25% limited  Left lateral flexion 25% limited  Right rotation 25% limited  Left rotation 25% limited   (Blank rows = not tested)  LOWER EXTREMITY ROM:     Active  Right eval Left eval  Hip flexion 100 103  Hip extension    Hip abduction  15; familiar sharp pain   Hip adduction    Hip internal rotation    Hip external rotation    Knee flexion    Knee extension    Ankle dorsiflexion    Ankle plantarflexion    Ankle inversion    Ankle eversion     (Blank rows = not tested)  LOWER EXTREMITY MMT:    MMT Right eval Left eval  Hip flexion 4/5 4-/5  Hip extension    Hip abduction    Hip adduction    Hip internal rotation    Hip external rotation    Knee flexion 4+/5 3+/5  Knee extension 4-/5 3+/5  Ankle dorsiflexion 3/5 3/5  Ankle plantarflexion    Ankle inversion    Ankle eversion     (Blank rows = not  tested)  LUMBAR SPECIAL TESTS:  Straight leg raise test: Positive and FABER test: Positive  FUNCTIONAL TESTS:  5 times sit to stand: 18.85 seconds with intermittent UE support  GAIT: Assistive device utilized: None Level of assistance: Complete Independence Comments: No significant gait deviations   TODAY'S TREATMENT:  DATE:                                     11/30 EXERCISE LOG  Exercise Repetitions and Resistance Comments  Nustep  L4-5 x 16 minutes   LAQ 5 lbs x 25 reps each    Seated marching 5 lbs x 25 reps each    Rocker board  4 minutes Intermittent UE support  Lateral step up  4" step x 20 reps each    Thomas stretch  4 x 30 seconds        Blank cell = exercise not performed today                                    11/27 EXERCISE LOG  Exercise Repetitions and Resistance Comments  Nustep  L4 x 15 minutes   Seated hip ADD isometric  10 reps  w/ 5 second hold   Side stepping 20 reps each    Tandem balance  3 x 30 seconds  Intermittent UE support required; slight left hip discomfort  Seated clams Green t-band x 2 minutes Added to HEP   LAQ 5 lbs x 20 reps each   Standing HS curl 5 lbs x 20 reps each    Chair taps  To elevated mat table; 20 reps   Rocker board 4 minutes Intermittent UE support required   Blank cell = exercise not performed today   PATIENT EDUCATION:  Education details: Plan of care, goals for therapy Person educated: Patient Education method: Explanation Education comprehension: verbalized understanding  HOME EXERCISE PROGRAM:   ASSESSMENT:  CLINICAL IMPRESSION: Patient was introduced to multiple new and familiar interventions for improved lower extremity strength and stability with moderate difficulty and fatigue. He required minimal cueing with the [facility] stretch for proper positioning to facilitate improved hip flexor  mobility. He experienced no increase in pain or discomfort with any of today's interventions. However, he reported feeling fatigued at the conclusion of treatment. He continues to require skilled physical therapy to address his remaining impairments to return to his prior level of function.   OBJECTIVE IMPAIRMENTS: decreased activity tolerance, decreased mobility, difficulty walking, decreased ROM, decreased strength, hypomobility, impaired tone, and pain.   ACTIVITY LIMITATIONS: sitting, standing, stairs, transfers, and locomotion level  PARTICIPATION LIMITATIONS: driving, community activity, and occupation  PERSONAL FACTORS: Time since onset of injury/illness/exacerbation and 3+ comorbidities: Hypertension, type 2 diabetes, myopathy  are also affecting patient's functional outcome.   REHAB POTENTIAL: Fair    CLINICAL DECISION MAKING: Evolving/moderate complexity  EVALUATION COMPLEXITY: Moderate   GOALS: Goals reviewed with patient? Yes  SHORT TERM GOALS: Target date: 05/07/22  Patient will be independent with his initial HEP. Baseline: Goal status: INITIAL  2.  Patient will be able to complete his daily activities without his pain exceeding 4/10. Baseline:  Goal status: INITIAL  3.  Patient will improve his 5 time sit to stand to 15 seconds or less. Baseline:  Goal status: INITIAL  LONG TERM GOALS: Target date: 05/21/22  Patient will be independent with his advanced HEP. Baseline:  Goal status: INITIAL  2.  Patient will be able to complete his daily activities without his familiar pain exceeding 2/10. Baseline:  Goal status: INITIAL  3.  Patient will improve his 5 times sit to stand to 12 seconds or less. Baseline:  Goal status: INITIAL  4.  Patient will report being able to walk at least 15 minutes without being limited by his familiar hip pain. Baseline:  Goal status: INITIAL  PLAN:  PT FREQUENCY: 2x/week  PT DURATION: 4 weeks  PLANNED INTERVENTIONS:  Therapeutic exercises, Therapeutic activity, Neuromuscular re-education, Balance training, Patient/Family education, Self Care, Joint mobilization, Stair training, Dry Needling, Electrical stimulation, Spinal mobilization, Cryotherapy, Moist heat, Vasopneumatic device, Manual therapy, and Re-evaluation.  PLAN FOR NEXT SESSION: NuStep, lower extremity strengthening, balance interventions, manual therapy, and modalities as needed   Darlin Coco, PT 05/03/2022, 3:40 PM

## 2022-05-05 ENCOUNTER — Other Ambulatory Visit: Payer: Self-pay | Admitting: Nurse Practitioner

## 2022-05-05 DIAGNOSIS — I10 Essential (primary) hypertension: Secondary | ICD-10-CM

## 2022-05-07 ENCOUNTER — Encounter: Payer: Self-pay | Admitting: Nurse Practitioner

## 2022-05-07 ENCOUNTER — Ambulatory Visit: Payer: Medicare HMO | Attending: Family Medicine

## 2022-05-07 DIAGNOSIS — M6281 Muscle weakness (generalized): Secondary | ICD-10-CM | POA: Diagnosis not present

## 2022-05-07 DIAGNOSIS — M79652 Pain in left thigh: Secondary | ICD-10-CM | POA: Diagnosis not present

## 2022-05-07 MED ORDER — CLONIDINE HCL 0.1 MG PO TABS: 0.1000 mg | ORAL_TABLET | Freq: Three times a day (TID) | ORAL | 0 refills | Status: DC

## 2022-05-07 NOTE — Telephone Encounter (Signed)
LMTCB TO SCHEDULE APPT LETTER MAILED 

## 2022-05-07 NOTE — Addendum Note (Signed)
Addended by: Antonietta Barcelona D on: 05/07/2022 10:44 AM   Modules accepted: Orders

## 2022-05-07 NOTE — Telephone Encounter (Signed)
Pt has appt next week, is out of medication & is going out of town. RFd 10d sent to pharmacy

## 2022-05-07 NOTE — Telephone Encounter (Signed)
MMM NTBS 30 days given 04/03/22

## 2022-05-07 NOTE — Therapy (Signed)
OUTPATIENT PHYSICAL THERAPY THORACOLUMBAR TREATMENT   Patient Name: Philip Richardson MRN: [id number] DOB:12-22-1955, 66 y.o., male Today's Date: 05/07/2022  END OF SESSION:  PT End of Session - 05/07/22 1303     Visit Number 4    Number of Visits 8    Date for PT Re-Evaluation 05/25/22    PT Start Time 1300    PT Stop Time 1345    PT Time Calculation (min) 45 min    Activity Tolerance Patient tolerated treatment well    Behavior During Therapy [facility] for tasks assessed/performed             Past Medical History:  Diagnosis Date   Frequency of urination    GERD (gastroesophageal reflux disease)    Horseshoe kidney    BILATERAL   Hypertension    Renal calculus, bilateral    Type 2 diabetes mellitus ([place])    Urgency of urination    Wears dentures    Past Surgical History:  Procedure Laterality Date   BIOPSY  04/25/2020   Procedure: BIOPSY;  Surgeon: [name], DO;  [facility];  Service: Endoscopy;;  duodenum gastric esophagus   COLONOSCOPY WITH PROPOFOL N/A 04/25/2020   internal hemorrhoids, one 5 mm polyp in descending colon. Tubular adenoma. 5 year surveillance.   CYSTOSCOPY W/ URETERAL STENT PLACEMENT Bilateral 12/09/2013   Procedure: CYSTOSCOPY WITH RETROGRADE PYELOGRAM/URETERAL STENT PLACEMENT;  Surgeon: Alexis Frock, MD;  Location: [facility];  Service: Urology;  Laterality: Bilateral;   CYSTOSCOPY WITH RETROGRADE PYELOGRAM, URETEROSCOPY AND STENT PLACEMENT Bilateral 09/30/2013   Procedure: CYSTOSCOPY WITH BILATERAL RETROGRADE PYELOGRAM, LEFT DIAGNOSTIC URETEROSCOPY AND Left ureteral stent;  Surgeon: Alexis Frock, MD;  [facility];  Service: Urology;  Laterality: Bilateral;   ESOPHAGOGASTRODUODENOSCOPY (EGD) WITH PROPOFOL N/A 04/25/2020   Mildly severe candida esophagitis without bleed, s/p biopsy. Suspicion for eosinophilic esophagitis but no increased eosinophils. Gastritis. Reactive gastropathy. Negative H.pylori.  +KOH  prep.    PERCUTANEOUS NEPHROLITHOTRIPSY  2005   POLYPECTOMY  04/25/2020   Procedure: POLYPECTOMY;  Surgeon: [name], DO;  [facility];  Service: Endoscopy;;  colon   ROBOT ASSISTED PYELOPLASTY N/A 12/09/2013   Procedure: ROBOTIC ASSISTED BILATERAL PYELOLITHOTOMY, RIGHT  PYELOPLASTY ;  Surgeon: Alexis Frock, MD;  Location: [facility];  Service: Urology;  Laterality: N/A;   Patient Active Problem List   Diagnosis Date Noted   Drug-induced myopathy 03/14/2020   Normocytic anemia 10/09/2019   Elevated LFTs    GERD (gastroesophageal reflux disease) 07/16/2019   Hyperkalemia 07/16/2019   Constipation 07/16/2019   Hyperlipidemia with target LDL less than 100 02/01/2015   Staghorn kidney stones 12/09/2013   Hypertension 10/13/2013   Diabetes ([place]) 09/10/2013   REFERRING PROVIDER: Nuala Alpha, MD  REFERRING DIAG: Low back pain, unspecified   Rationale for Evaluation and Treatment: Rehabilitation  THERAPY DIAG:  Pain in left thigh  Muscle weakness (generalized)  ONSET DATE: about 2 years ago  SUBJECTIVE:  SUBJECTIVE STATEMENT: Patient reports that his left hip has been bothering him a little bit, but he has not had any major flare ups since his last appointment.   PERTINENT HISTORY:  Hypertension, type 2 diabetes, myopathy  PAIN:  Are you having pain? Yes: NPRS scale: 1/10 Pain location: left hip and knee Pain description: sharp, sore, uncomfortable, dull Aggravating factors: sitting, walking (<5 minutes), standing (<5 minutes), or any pressure on his left hip or leg Relieving factors: medication  PRECAUTIONS: None  WEIGHT BEARING RESTRICTIONS: No  FALLS:  Has patient fallen in last 6 months? Yes. 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   PALPATION: TTP: globally between left hip and knee, left QL, and L5-S1 spinous process  JOINT MOBILITY:  Lumbar: WFL with pain and L5-S1    LUMBAR ROM:   AROM eval  Flexion 60  Extension 28  Right lateral flexion 25% limited  Left lateral flexion 25% limited  Right rotation 25% limited  Left rotation 25% limited   (Blank rows = not tested)  LOWER EXTREMITY ROM:     Active  Right eval Left eval  Hip flexion 100 103  Hip extension    Hip abduction  15; familiar sharp pain   Hip adduction    Hip internal rotation    Hip external rotation    Knee flexion    Knee extension    Ankle dorsiflexion    Ankle plantarflexion    Ankle inversion    Ankle eversion     (Blank rows = not tested)  LOWER EXTREMITY MMT:    MMT Right eval Left eval  Hip flexion 4/5 4-/5  Hip extension    Hip abduction    Hip adduction    Hip internal rotation    Hip external rotation    Knee flexion 4+/5 3+/5  Knee extension 4-/5 3+/5  Ankle dorsiflexion 3/5 3/5  Ankle plantarflexion    Ankle inversion    Ankle eversion     (Blank rows = not tested)  LUMBAR SPECIAL TESTS:   Straight leg raise test: Positive and FABER test: Positive  FUNCTIONAL TESTS:  5 times sit to stand: 18.85 seconds with intermittent UE support  GAIT: Assistive device utilized: None Level of assistance: Complete Independence Comments: No significant gait deviations   TODAY'S TREATMENT:  DATE:                                     12/4 EXERCISE LOG  Exercise Repetitions and Resistance Comments  Nustep  L4 x 17 minutes   BOSU control (lateral)  2 minutes  With UE support  Marching on BOSU Ball up; 2.5 minutes With UE support  Step up  6" step x 3 minutes Alternating LE  Cybex knee flexion  50# x 2 minutes   Cybex knee extension 20# x 2 minutes   Standing IT band stretch  4 x 30 seconds   Cybex leg press 1 plate; seat 7 x 2 minutes    Blank cell = exercise not performed today                                    11/30 EXERCISE LOG  Exercise Repetitions and Resistance Comments  Nustep  L4-5 x 16 minutes   LAQ 5 lbs x 25 reps each    Seated marching 5 lbs x 25 reps each    Rocker board  4 minutes Intermittent UE support  Lateral step up  4" step x 20 reps each    Thomas stretch  4 x 30 seconds        Blank cell = exercise not performed today                                    11/27 EXERCISE LOG  Exercise Repetitions and Resistance Comments  Nustep  L4 x 15 minutes   Seated hip ADD isometric  10 reps  w/ 5 second hold   Side stepping 20 reps each    Tandem balance  3 x 30 seconds  Intermittent UE support required; slight left hip discomfort  Seated clams Green t-band x 2 minutes Added to HEP   LAQ 5 lbs x 20 reps each   Standing HS curl 5 lbs x 20 reps each    Chair taps  To elevated mat table; 20 reps   Rocker board 4 minutes Intermittent UE support required   Blank cell = exercise not performed today   PATIENT EDUCATION:  Education details: Plan  of care, goals for therapy Person educated: Patient Education method: Explanation Education comprehension: verbalized understanding  HOME EXERCISE PROGRAM:   ASSESSMENT:  CLINICAL IMPRESSION: Patient was introduced to multiple new interventions for improved lower extremity strength and reduced hip discomfort with moderate difficulty. He required minimal cueing with these new intervention for a slow eccentric control for improved quadriceps strength. He reported no pain or discomfort with any of today's interventions. He reported feeling "a little tired, but not as bad as last week" upon the conclusion of treatment. He continues to require skilled physical therapy to address his remaining impairments to return to his prior level of function.   OBJECTIVE IMPAIRMENTS: decreased activity tolerance, decreased mobility, difficulty walking, decreased ROM, decreased strength, hypomobility, impaired tone, and pain.   ACTIVITY LIMITATIONS: sitting, standing, stairs, transfers, and locomotion level  PARTICIPATION LIMITATIONS: driving, community activity, and occupation  PERSONAL FACTORS: Time since onset of injury/illness/exacerbation and 3+ comorbidities: Hypertension, type 2 diabetes, myopathy  are also affecting patient's functional outcome.   REHAB POTENTIAL: Fair    CLINICAL DECISION MAKING:  Evolving/moderate complexity  EVALUATION COMPLEXITY: Moderate   GOALS: Goals reviewed with patient? Yes  SHORT TERM GOALS: Target date: 05/07/22  Patient will be independent with his initial HEP. Baseline: Goal status: INITIAL  2.  Patient will be able to complete his daily activities without his pain exceeding 4/10. Baseline:  Goal status: INITIAL  3.  Patient will improve his 5 time sit to stand to 15 seconds or less. Baseline:  Goal status: INITIAL  LONG TERM GOALS: Target date: 05/21/22  Patient will be independent with his advanced HEP. Baseline:  Goal status: INITIAL  2.  Patient  will be able to complete his daily activities without his familiar pain exceeding 2/10. Baseline:  Goal status: INITIAL  3.  Patient will improve his 5 times sit to stand to 12 seconds or less. Baseline:  Goal status: INITIAL  4.  Patient will report being able to walk at least 15 minutes without being limited by his familiar hip pain. Baseline:  Goal status: INITIAL  PLAN:  PT FREQUENCY: 2x/week  PT DURATION: 4 weeks  PLANNED INTERVENTIONS: Therapeutic exercises, Therapeutic activity, Neuromuscular re-education, Balance training, Patient/Family education, Self Care, Joint mobilization, Stair training, Dry Needling, Electrical stimulation, Spinal mobilization, Cryotherapy, Moist heat, Vasopneumatic device, Manual therapy, and Re-evaluation.  PLAN FOR NEXT SESSION: NuStep, lower extremity strengthening, balance interventions, manual therapy, and modalities as needed   Darlin Coco, PT 05/07/2022, 2:38 PM

## 2022-05-14 ENCOUNTER — Encounter: Payer: Self-pay | Admitting: Nurse Practitioner

## 2022-05-14 ENCOUNTER — Ambulatory Visit (INDEPENDENT_AMBULATORY_CARE_PROVIDER_SITE_OTHER): Payer: Medicare HMO | Admitting: Nurse Practitioner

## 2022-05-14 ENCOUNTER — Ambulatory Visit: Payer: Medicare HMO

## 2022-05-14 VITALS — BP 130/71 | HR 64 | Temp 98.1°F | Resp 20 | Ht 73.0 in | Wt 196.0 lb

## 2022-05-14 DIAGNOSIS — E785 Hyperlipidemia, unspecified: Secondary | ICD-10-CM | POA: Diagnosis not present

## 2022-05-14 DIAGNOSIS — M6281 Muscle weakness (generalized): Secondary | ICD-10-CM

## 2022-05-14 DIAGNOSIS — Z794 Long term (current) use of insulin: Secondary | ICD-10-CM

## 2022-05-14 DIAGNOSIS — K5901 Slow transit constipation: Secondary | ICD-10-CM | POA: Diagnosis not present

## 2022-05-14 DIAGNOSIS — K219 Gastro-esophageal reflux disease without esophagitis: Secondary | ICD-10-CM | POA: Diagnosis not present

## 2022-05-14 DIAGNOSIS — M79652 Pain in left thigh: Secondary | ICD-10-CM

## 2022-05-14 DIAGNOSIS — I10 Essential (primary) hypertension: Secondary | ICD-10-CM

## 2022-05-14 DIAGNOSIS — Z23 Encounter for immunization: Secondary | ICD-10-CM | POA: Diagnosis not present

## 2022-05-14 DIAGNOSIS — M25551 Pain in right hip: Secondary | ICD-10-CM | POA: Diagnosis not present

## 2022-05-14 DIAGNOSIS — E875 Hyperkalemia: Secondary | ICD-10-CM | POA: Diagnosis not present

## 2022-05-14 DIAGNOSIS — E1142 Type 2 diabetes mellitus with diabetic polyneuropathy: Secondary | ICD-10-CM

## 2022-05-14 DIAGNOSIS — D649 Anemia, unspecified: Secondary | ICD-10-CM

## 2022-05-14 DIAGNOSIS — M25552 Pain in left hip: Secondary | ICD-10-CM | POA: Diagnosis not present

## 2022-05-14 DIAGNOSIS — E119 Type 2 diabetes mellitus without complications: Secondary | ICD-10-CM | POA: Diagnosis not present

## 2022-05-14 LAB — BAYER DCA HB A1C WAIVED: HB A1C (BAYER DCA - WAIVED): 13.3 % — ABNORMAL HIGH (ref 4.8–5.6)

## 2022-05-14 MED ORDER — LANTUS SOLOSTAR 100 UNIT/ML ~~LOC~~ SOPN: 80.0000 [IU] | PEN_INJECTOR | Freq: Every day | SUBCUTANEOUS | 11 refills | Status: DC

## 2022-05-14 MED ORDER — PANTOPRAZOLE SODIUM 40 MG PO TBEC: 40.0000 mg | DELAYED_RELEASE_TABLET | Freq: Two times a day (BID) | ORAL | 1 refills | Status: DC

## 2022-05-14 MED ORDER — CLONIDINE HCL 0.1 MG PO TABS: 0.1000 mg | ORAL_TABLET | Freq: Three times a day (TID) | ORAL | 0 refills | Status: DC

## 2022-05-14 MED ORDER — DAPAGLIFLOZIN PROPANEDIOL 10 MG PO TABS: 10.0000 mg | ORAL_TABLET | Freq: Every day | ORAL | 5 refills | Status: DC

## 2022-05-14 MED ORDER — GLIMEPIRIDE 4 MG PO TABS: ORAL_TABLET | ORAL | 1 refills | Status: DC

## 2022-05-14 MED ORDER — AMLODIPINE BESYLATE 10 MG PO TABS: 10.0000 mg | ORAL_TABLET | Freq: Every day | ORAL | 1 refills | Status: DC

## 2022-05-14 MED ORDER — FERROUS SULFATE 325 (65 FE) MG PO TABS: 325.0000 mg | ORAL_TABLET | Freq: Every day | ORAL | 1 refills | Status: DC

## 2022-05-14 NOTE — Therapy (Signed)
OUTPATIENT PHYSICAL THERAPY THORACOLUMBAR TREATMENT   Patient Name: Philip Richardson MRN: [id number] DOB:[birth year], 66 y.o., male Today's Date: 05/14/2022  END OF SESSION:  PT End of Session - 05/14/22 1306     Visit Number 5    Number of Visits 8    Date for PT Re-Evaluation 05/25/22    PT Start Time 1300    PT Stop Time 1344    PT Time Calculation (min) 44 min    Activity Tolerance Patient tolerated treatment well    Behavior During Therapy [facility] for tasks assessed/performed              Past Medical History:  Diagnosis Date   Frequency of urination    GERD (gastroesophageal reflux disease)    Horseshoe kidney    BILATERAL   Hypertension    Renal calculus, bilateral    Type 2 diabetes mellitus ([place])    Urgency of urination    Wears dentures    Past Surgical History:  Procedure Laterality Date   BIOPSY  04/25/2020   Procedure: BIOPSY;  Surgeon: [name], DO;  [facility];  Service: Endoscopy;;  duodenum gastric esophagus   COLONOSCOPY WITH PROPOFOL N/A 04/25/2020   internal hemorrhoids, one 5 mm polyp in descending colon. Tubular adenoma. 5 year surveillance.   CYSTOSCOPY W/ URETERAL STENT PLACEMENT Bilateral 12/09/2013   Procedure: CYSTOSCOPY WITH RETROGRADE PYELOGRAM/URETERAL STENT PLACEMENT;  Surgeon: Alexis Frock, MD;  Location: [facility];  Service: Urology;  Laterality: Bilateral;   CYSTOSCOPY WITH RETROGRADE PYELOGRAM, URETEROSCOPY AND STENT PLACEMENT Bilateral 09/30/2013   Procedure: CYSTOSCOPY WITH BILATERAL RETROGRADE PYELOGRAM, LEFT DIAGNOSTIC URETEROSCOPY AND Left ureteral stent;  Surgeon: Alexis Frock, MD;  Location: [facility];  Service: Urology;  Laterality: Bilateral;   ESOPHAGOGASTRODUODENOSCOPY (EGD) WITH PROPOFOL N/A 04/25/2020   Mildly severe candida esophagitis without bleed, s/p biopsy. Suspicion for eosinophilic esophagitis but no increased eosinophils. Gastritis. Reactive gastropathy. Negative H.pylori.  +KOH  prep.    PERCUTANEOUS NEPHROLITHOTRIPSY  2005   POLYPECTOMY  04/25/2020   Procedure: POLYPECTOMY;  Surgeon: [name], DO;  [facility];  Service: Endoscopy;;  colon   ROBOT ASSISTED PYELOPLASTY N/A 12/09/2013   Procedure: ROBOTIC ASSISTED BILATERAL PYELOLITHOTOMY, RIGHT  PYELOPLASTY ;  Surgeon: Alexis Frock, MD;  Location: [facility];  Service: Urology;  Laterality: N/A;   Patient Active Problem List   Diagnosis Date Noted   Drug-induced myopathy 03/14/2020   Normocytic anemia 10/09/2019   Elevated LFTs    GERD (gastroesophageal reflux disease) 07/16/2019   Hyperkalemia 07/16/2019   Constipation 07/16/2019   Hyperlipidemia with target LDL less than 100 02/01/2015   Staghorn kidney stones 12/09/2013   Hypertension 10/13/2013   Diabetes ([place]) 09/10/2013   REFERRING PROVIDER: Nuala Alpha, MD  REFERRING DIAG: Low back pain, unspecified   Rationale for Evaluation and Treatment: Rehabilitation  THERAPY DIAG:  Pain in left thigh  Muscle weakness (generalized)  ONSET DATE: about 2 years ago  SUBJECTIVE:  SUBJECTIVE STATEMENT: Patient reports that he is able to walk more without hurting, but he still notes that his thigh feels about the same.   PERTINENT HISTORY:  Hypertension, type 2 diabetes, myopathy  PAIN:  Are you having pain? Yes: NPRS scale: 1/10 Pain location: left hip and knee Pain description: sharp, sore, uncomfortable, dull Aggravating factors: sitting, walking (<5 minutes), standing (<5 minutes), or any pressure on his left hip or leg Relieving factors: medication  PRECAUTIONS: None  WEIGHT BEARING RESTRICTIONS: No  FALLS:  Has patient fallen in last 6 months? Yes. 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   PALPATION: TTP: globally between left hip and knee, left QL, and L5-S1 spinous process  JOINT MOBILITY:  Lumbar: WFL with pain and L5-S1    LUMBAR ROM:   AROM eval  Flexion 60  Extension 28  Right lateral flexion 25% limited  Left lateral flexion 25% limited  Right rotation 25% limited  Left rotation 25% limited   (Blank rows = not tested)  LOWER EXTREMITY ROM:     Active  Right eval Left eval  Hip flexion 100 103  Hip extension    Hip abduction  15; familiar sharp pain   Hip adduction    Hip internal rotation    Hip external rotation    Knee flexion    Knee extension    Ankle dorsiflexion    Ankle plantarflexion    Ankle inversion    Ankle eversion     (Blank rows = not tested)  LOWER EXTREMITY MMT:    MMT Right eval Left eval  Hip flexion 4/5 4-/5  Hip extension    Hip abduction    Hip adduction    Hip internal rotation    Hip external rotation    Knee flexion 4+/5 3+/5  Knee extension 4-/5 3+/5  Ankle dorsiflexion 3/5 3/5  Ankle plantarflexion    Ankle inversion    Ankle eversion     (Blank rows = not tested)  LUMBAR SPECIAL TESTS:  Straight leg raise test: Positive and  FABER test: Positive  FUNCTIONAL TESTS:  5 times sit to stand: 18.85 seconds with intermittent UE support  GAIT: Assistive device utilized: None Level of assistance: Complete Independence Comments: No significant gait deviations   TODAY'S TREATMENT:  DATE:                                     12/11 EXERCISE LOG  Exercise Repetitions and Resistance Comments  Nustep L4 x 18 minutes   Step up onto BOSU 2 minutes   Rocker board 5 minutes   Cybex leg press 2 plates; seat 7 x 3 minutes   Side stepping  Green t-band x 3 minutes   Cybex knee flexion  50# x 3 minutes   Lateral step up  8" step x 2 minutes    Blank cell = exercise not performed today                                    12/4 EXERCISE LOG  Exercise Repetitions and Resistance Comments  Nustep  L4 x 17 minutes   BOSU control (lateral)  2 minutes  With UE support  Marching on BOSU Ball up; 2.5 minutes With UE support  Step up  6" step x 3 minutes Alternating LE  Cybex knee flexion  50# x 2 minutes   Cybex knee extension 20# x 2 minutes   Standing IT band stretch  4 x 30 seconds   Cybex leg press 1 plate; seat 7 x 2 minutes    Blank cell = exercise not performed today                                    11/30 EXERCISE LOG  Exercise Repetitions and Resistance Comments  Nustep  L4-5 x 16 minutes   LAQ 5 lbs x 25 reps each    Seated marching 5 lbs x 25 reps each    Rocker board  4 minutes Intermittent UE support  Lateral step up  4" step x 20 reps each    Thomas stretch  4 x 30 seconds        Blank cell = exercise not performed today   PATIENT EDUCATION:  Education details: Plan of care, goals for therapy Person educated: Patient Education method: Explanation Education comprehension: verbalized understanding  HOME EXERCISE PROGRAM:   ASSESSMENT:  CLINICAL IMPRESSION: Patient was  progressed with familiar interventions for improved lower extremity strength and stability. He required minimal cueing with resisted sidestepping for improved eccentric control.  He experienced no pain or discomfort with any of today's interventions.  He reported feeling alright upon conclusion of treatment.  He continues to require skilled physical therapy to address his remaining impairments to return to his prior level of function.  OBJECTIVE IMPAIRMENTS: decreased activity tolerance, decreased mobility, difficulty walking, decreased ROM, decreased strength, hypomobility, impaired tone, and pain.   ACTIVITY LIMITATIONS: sitting, standing, stairs, transfers, and locomotion level  PARTICIPATION LIMITATIONS: driving, community activity, and occupation  PERSONAL FACTORS: Time since onset of injury/illness/exacerbation and 3+ comorbidities: Hypertension, type 2 diabetes, myopathy  are also affecting patient's functional outcome.   REHAB POTENTIAL: Fair    CLINICAL DECISION MAKING: Evolving/moderate complexity  EVALUATION COMPLEXITY: Moderate   GOALS: Goals reviewed with patient? Yes  SHORT TERM GOALS: Target date: 05/07/22  Patient will be independent with his initial HEP. Baseline: Goal status: MET  2.  Patient will be able to complete his daily activities without his pain exceeding 4/10. Baseline:  Goal status: MET  3.  Patient will improve his 5 time sit to stand to 15 seconds or less. Baseline:  Goal status: MET  LONG TERM GOALS: Target date: 05/21/22  Patient will be independent with his advanced HEP. Baseline:  Goal status: IN PROGRESS  2.  Patient will be able to complete his daily activities without his familiar pain exceeding 2/10. Baseline: 2-3/10 at most Goal status: IN PROGRESS  3.  Patient will improve his 5 times sit to stand to 12 seconds or less. Baseline: 13.55 seconds Goal status: IN PROGRESS  4.  Patient will report being able to walk at least 15  minutes without being limited by his familiar hip pain. Baseline:  Goal status: MET  PLAN:  PT FREQUENCY: 2x/week  PT DURATION: 4 weeks  PLANNED INTERVENTIONS: Therapeutic exercises, Therapeutic activity, Neuromuscular re-education, Balance training, Patient/Family education, Self Care, Joint mobilization, Stair training, Dry Needling, Electrical stimulation, Spinal mobilization, Cryotherapy, Moist heat, Vasopneumatic device, Manual therapy, and Re-evaluation.  PLAN FOR NEXT SESSION: NuStep, lower extremity strengthening, balance interventions, manual therapy, and modalities as needed   Darlin Coco, PT 05/14/2022, 1:56 PM

## 2022-05-14 NOTE — Progress Notes (Signed)
Subjective:    Patient ID: Philip Richardson, male    DOB: 31-Jul-1955, 66 y.o.   MRN: [id number]   Chief Complaint: medical management of chronic issues     HPI:  Philip Richardson is a 66 y.o. who identifies as a male who was assigned male at birth.   Social history: Lives with: by himself Work history: works part time at [employer] in today for follow up of the following chronic medical issues:  1. Primary hypertension No c/o chest pain, sob or headache. Does not check blood pressure at home. BP Readings from Last 3 Encounters:  11/16/21 129/71  10/17/21 138/77  10/05/21 124/70     2. Hyperlipidemia with target LDL less than 100 Does not really watch diet and does no dedicated exercise. Lab Results  Component Value Date   CHOL 194 09/19/2021   HDL 35 (L) 09/19/2021   LDLCALC 113 (H) 09/19/2021   LDLDIRECT 113 (H) 04/08/2018   TRIG 266 (H) 09/19/2021   CHOLHDL 5.5 (H) 09/19/2021     3. Hyperkalemia No c/o muscle cramps Lab Results  Component Value Date   K 4.8 09/19/2021     4. Gastroesophageal reflux disease, unspecified whether esophagitis present Is on protonix daily and is doing well.  5. Type 2 diabetes mellitus without complication, with long-term current use of insulin ([place]) Does not check blood sugars daily. He has not been checking them at all lately Lab Results  Component Value Date   HGBA1C 11.8 (H) 11/16/2021     6. Normocytic anemia No c/o fatigue Lab Results  Component Value Date   HGB 13.0 09/19/2021     7. Slow transit constipation Is some better   New complaints: Is currently receiving physical therapy for  Allergies  Allergen Reactions   Atorvastatin     Myopathy/weakness   Invokana [Canagliflozin] Other (See Comments)    weakness   Outpatient Encounter Medications as of 05/14/2022  Medication Sig   acetaminophen (TYLENOL) 325 MG tablet Take 325-650 mg by mouth every 6 (six) hours as needed (for  pain.).   amLODipine (NORVASC) 10 MG tablet TAKE ONE TABLET ONCE DAILY   Blood Glucose Monitoring Suppl (ONETOUCH VERIO FLEX SYSTEM) w/Device KIT Use to test blood sugar twice daily. DX E11.9   cloNIDine (CATAPRES) 0.1 MG tablet Take 1 tablet (0.1 mg total) by mouth 3 (three) times daily.   dapagliflozin propanediol (FARXIGA) 10 MG TABS tablet Take 1 tablet (10 mg total) by mouth daily.   ferrous sulfate 325 (65 FE) MG tablet Take 1 tablet (325 mg total) by mouth daily with breakfast.   fluticasone (FLONASE) 50 MCG/ACT nasal spray Place 2 sprays into both nostrils daily.   glimepiride (AMARYL) 4 MG tablet TAKE (1) TABLET DAILY BEFORE BREAKFAST.   glucose blood (ONETOUCH VERIO) test strip Use to test blood sugar twice daily. DX E11.9   insulin glargine, 2 Unit Dial, (TOUJEO MAX SOLOSTAR) 300 UNIT/ML Solostar Pen Inject 60-80 Units into the skin daily at 6 (six) AM.   Insulin Pen Needle (PEN NEEDLES 31GX5/16") 31G X 8 MM MISC Use to inject insulin daily as prescribed   OneTouch Delica Lancets 54D MISC Use to test blood sugar twice daily. DX E11.9   pantoprazole (PROTONIX) 40 MG tablet Take 1 tablet (40 mg total) by mouth 2 (two) times daily.   No facility-administered encounter medications on file as of 05/14/2022.    Past Surgical History:  Procedure Laterality Date   BIOPSY  04/25/2020   Procedure: BIOPSY;  Surgeon: [name], DO;  [facility];  Service: Endoscopy;;  duodenum gastric esophagus   COLONOSCOPY WITH PROPOFOL N/A 04/25/2020   internal hemorrhoids, one 5 mm polyp in descending colon. Tubular adenoma. 5 year surveillance.   CYSTOSCOPY W/ URETERAL STENT PLACEMENT Bilateral 12/09/2013   Procedure: CYSTOSCOPY WITH RETROGRADE PYELOGRAM/URETERAL STENT PLACEMENT;  Surgeon: Alexis Frock, MD;  Location: [facility];  Service: Urology;  Laterality: Bilateral;   CYSTOSCOPY WITH RETROGRADE PYELOGRAM, URETEROSCOPY AND STENT PLACEMENT Bilateral 09/30/2013   Procedure:  CYSTOSCOPY WITH BILATERAL RETROGRADE PYELOGRAM, LEFT DIAGNOSTIC URETEROSCOPY AND Left ureteral stent;  Surgeon: Alexis Frock, MD;  Location[facility];  Service: Urology;  Laterality: Bilateral;   ESOPHAGOGASTRODUODENOSCOPY (EGD) WITH PROPOFOL N/A 04/25/2020   Mildly severe candida esophagitis without bleed, s/p biopsy. Suspicion for eosinophilic esophagitis but no increased eosinophils. Gastritis. Reactive gastropathy. Negative H.pylori.  +KOH prep.    PERCUTANEOUS NEPHROLITHOTRIPSY  2005   POLYPECTOMY  04/25/2020   Procedure: POLYPECTOMY;  Surgeon: [name], DO;  [facility];  Service: Endoscopy;;  colon   ROBOT ASSISTED PYELOPLASTY N/A 12/09/2013   Procedure: ROBOTIC ASSISTED BILATERAL PYELOLITHOTOMY, RIGHT  PYELOPLASTY ;  Surgeon: Alexis Frock, MD;  Location: [facility];  Service: Urology;  Laterality: N/A;    Family History  Problem Relation Age of Onset   Cancer Mother    Colon cancer Neg Hx    Pancreatitis Neg Hx       Controlled substance contract: n/a     Review of Systems  Constitutional:  Negative for diaphoresis.  Eyes:  Negative for pain.  Respiratory:  Negative for shortness of breath.   Cardiovascular:  Negative for chest pain, palpitations and leg swelling.  Gastrointestinal:  Negative for abdominal pain.  Endocrine: Negative for polydipsia.  Skin:  Negative for rash.  Neurological:  Negative for dizziness, weakness and headaches.  Hematological:  Does not bruise/bleed easily.  All other systems reviewed and are negative.      Objective:   Physical Exam Vitals and nursing note reviewed.  Constitutional:      Appearance: Normal appearance. He is well-developed.  HENT:     Head: Normocephalic.     Nose: Nose normal.     Mouth/Throat:     Mouth: Mucous membranes are moist.     Pharynx: Oropharynx is clear.  Eyes:     Pupils: Pupils are equal, round, and reactive to light.  Neck:     Thyroid: No thyroid mass or  thyromegaly.     Vascular: No carotid bruit or JVD.     Trachea: Phonation normal.  Cardiovascular:     Rate and Rhythm: Normal rate and regular rhythm.  Pulmonary:     Effort: Pulmonary effort is normal. No respiratory distress.     Breath sounds: Normal breath sounds.  Abdominal:     General: Bowel sounds are normal.     Palpations: Abdomen is soft.     Tenderness: There is no abdominal tenderness.  Musculoskeletal:        General: Normal range of motion.     Cervical back: Normal range of motion and neck supple.  Lymphadenopathy:     Cervical: No cervical adenopathy.  Skin:    General: Skin is warm and dry.  Neurological:     Mental Status: He is alert and oriented to person, place, and time.  Psychiatric:        Behavior: Behavior normal.  Thought Content: Thought content normal.        Judgment: Judgment normal.    BP 130/71   Pulse 64   Temp 98.1 F (36.7 C) (Temporal)   Resp 20   Ht _0  (1.854 m)   Wt 196 lb (88.9 kg)   SpO2 97%   BMI 25.86 kg/m   Hg ba1c 13.3       Assessment & Plan:  Zadin Lange comes in today with chief complaint of Medical Management of Chronic Issues   Diagnosis and orders addressed:  1. Primary hypertension Low sodium diet - CBC with Differential/Platelet - CMP14+EGFR - cloNIDine (CATAPRES) 0.1 MG tablet; Take 1 tablet (0.1 mg total) by mouth 3 (three) times daily.  Dispense: 30 tablet; Refill: 0 - amLODipine (NORVASC) 10 MG tablet; Take 1 tablet (10 mg total) by mouth daily.  Dispense: 30 tablet; Refill: 1  2. Hyperlipidemia with target LDL less than 100 Low fat diet - Lipid panel  3. Hyperkalemia Labs pending  4. Gastroesophageal reflux disease, unspecified whether esophagitis present Avoid spicy foods Do not eat 2 hours prior to bedtime - pantoprazole (PROTONIX) 40 MG tablet; Take 1 tablet (40 mg total) by mouth 2 (two) times daily.  Dispense: 180 tablet; Refill: 1  5. Type 2 diabetes mellitus without  complication, with long-term current use of insulin ([place]) Stop toujeo Changed to lantus Will make appointment with clinical pharmacist - [facility] DCA Hb A1c Waived - insulin glargine (LANTUS SOLOSTAR) 100 UNIT/ML Solostar Pen; Inject 80 Units into the skin daily.  Dispense: 15 mL; Refill: 11 - dapagliflozin propanediol (FARXIGA) 10 MG TABS tablet; Take 1 tablet (10 mg total) by mouth daily.  Dispense: 90 tablet; Refill: 5  6. Normocytic anemia Labs pending - ferrous sulfate 325 (65 FE) MG tablet; Take 1 tablet (325 mg total) by mouth daily with breakfast.  Dispense: 90 tablet; Refill: 1  7. Slow transit constipation Increase fiber in diet  8. Type 2 diabetes mellitus with diabetic polyneuropathy, with long-term current use of insulin ([facility]) Strict carb counting - glimepiride (AMARYL) 4 MG tablet; TAKE (1) TABLET DAILY BEFORE BREAKFAST.  Dispense: 90 tablet; Refill: 1   Labs pending Health Maintenance reviewed Diet and exercise encouraged  Follow up plan: 3 months   Mary-Margaret Hassell Done, FNP

## 2022-05-14 NOTE — Patient Instructions (Signed)

## 2022-05-15 LAB — LIPID PANEL
Chol/HDL Ratio: 5.5 ratio — ABNORMAL HIGH (ref 0.0–5.0)
Cholesterol, Total: 214 mg/dL — ABNORMAL HIGH (ref 100–199)
HDL: 39 mg/dL — ABNORMAL LOW (ref >39)
LDL Chol Calc (NIH): 144 mg/dL — ABNORMAL HIGH (ref 0–99)
Triglycerides: 169 mg/dL — ABNORMAL HIGH (ref 0–149)
VLDL Cholesterol Cal: 31 mg/dL (ref 5–40)

## 2022-05-15 LAB — CMP14+EGFR
ALT: 28 IU/L (ref 0–44)
AST: 20 IU/L (ref 0–40)
Albumin/Globulin Ratio: 1.6 (ref 1.2–2.2)
Albumin: 4.1 g/dL (ref 3.9–4.9)
Alkaline Phosphatase: 105 IU/L (ref 44–121)
BUN/Creatinine Ratio: 12 (ref 10–24)
BUN: 30 mg/dL — ABNORMAL HIGH (ref 8–27)
Bilirubin Total: 0.4 mg/dL (ref 0.0–1.2)
CO2: 20 mmol/L (ref 20–29)
Calcium: 9.7 mg/dL (ref 8.6–10.2)
Chloride: 98 mmol/L (ref 96–106)
Creatinine, Ser: 2.57 mg/dL — ABNORMAL HIGH (ref 0.76–1.27)
Globulin, Total: 2.5 g/dL (ref 1.5–4.5)
Glucose: 356 mg/dL — ABNORMAL HIGH (ref 70–99)
Potassium: 4.7 mmol/L (ref 3.5–5.2)
Sodium: 137 mmol/L (ref 134–144)
Total Protein: 6.6 g/dL (ref 6.0–8.5)
eGFR: 27 mL/min/{1.73_m2} — ABNORMAL LOW (ref >59)

## 2022-05-15 LAB — CBC WITH DIFFERENTIAL/PLATELET
Basophils Absolute: 0.1 10*3/uL (ref 0.0–0.2)
Basos: 1 %
EOS (ABSOLUTE): 0.1 10*3/uL (ref 0.0–0.4)
Eos: 2 %
Hematocrit: 39.3 % (ref 37.5–51.0)
Hemoglobin: 12.9 g/dL — ABNORMAL LOW (ref 13.0–17.7)
Immature Grans (Abs): 0 10*3/uL (ref 0.0–0.1)
Immature Granulocytes: 0 %
Lymphocytes Absolute: 1.6 10*3/uL (ref 0.7–3.1)
Lymphs: 23 %
MCH: 27.2 pg (ref 26.6–33.0)
MCHC: 32.8 g/dL (ref 31.5–35.7)
MCV: 83 fL (ref 79–97)
Monocytes Absolute: 0.4 10*3/uL (ref 0.1–0.9)
Monocytes: 6 %
Neutrophils Absolute: 4.7 10*3/uL (ref 1.4–7.0)
Neutrophils: 68 %
Platelets: 192 10*3/uL (ref 150–450)
RBC: 4.75 x10E6/uL (ref 4.14–5.80)
RDW: 13.6 % (ref 11.6–15.4)
WBC: 6.8 10*3/uL (ref 3.4–10.8)

## 2022-05-17 ENCOUNTER — Encounter: Payer: Self-pay | Admitting: Physical Therapy

## 2022-05-17 ENCOUNTER — Ambulatory Visit: Payer: Medicare HMO | Admitting: Physical Therapy

## 2022-05-17 DIAGNOSIS — M6281 Muscle weakness (generalized): Secondary | ICD-10-CM | POA: Diagnosis not present

## 2022-05-17 DIAGNOSIS — M79652 Pain in left thigh: Secondary | ICD-10-CM

## 2022-05-17 NOTE — Therapy (Addendum)
OUTPATIENT PHYSICAL THERAPY THORACOLUMBAR TREATMENT   Patient Name: Philip Richardson MRN: [id number] DOB:[birth year], 66 y.o., male Today's Date: 05/17/2022  END OF SESSION:  PT End of Session - 05/17/22 1300     Visit Number 6    Number of Visits 8    Date for PT Re-Evaluation 05/25/22    PT Start Time 1304    PT Stop Time 1344    PT Time Calculation (min) 40 min    Activity Tolerance Patient tolerated treatment well    Behavior During Therapy [facility] for tasks assessed/performed            Past Medical History:  Diagnosis Date   Frequency of urination    GERD (gastroesophageal reflux disease)    Horseshoe kidney    BILATERAL   Hypertension    Renal calculus, bilateral    Type 2 diabetes mellitus ([place])    Urgency of urination    Wears dentures    Past Surgical History:  Procedure Laterality Date   BIOPSY  04/25/2020   Procedure: BIOPSY;  Surgeon: [name], DO;  [facility];  Service: Endoscopy;;  duodenum gastric esophagus   COLONOSCOPY WITH PROPOFOL N/A 04/25/2020   internal hemorrhoids, one 5 mm polyp in descending colon. Tubular adenoma. 5 year surveillance.   CYSTOSCOPY W/ URETERAL STENT PLACEMENT Bilateral 12/09/2013   Procedure: CYSTOSCOPY WITH RETROGRADE PYELOGRAM/URETERAL STENT PLACEMENT;  Surgeon: Alexis Frock, MD;  Location[facility];  Service: Urology;  Laterality: Bilateral;   CYSTOSCOPY WITH RETROGRADE PYELOGRAM, URETEROSCOPY AND STENT PLACEMENT Bilateral 09/30/2013   Procedure: CYSTOSCOPY WITH BILATERAL RETROGRADE PYELOGRAM, LEFT DIAGNOSTIC URETEROSCOPY AND Left ureteral stent;  Surgeon: Alexis Frock, MD;  Location[facility];  Service: Urology;  Laterality: Bilateral;   ESOPHAGOGASTRODUODENOSCOPY (EGD) WITH PROPOFOL N/A 04/25/2020   Mildly severe candida esophagitis without bleed, s/p biopsy. Suspicion for eosinophilic esophagitis but no increased eosinophils. Gastritis. Reactive gastropathy. Negative H.pylori.  +KOH  prep.    PERCUTANEOUS NEPHROLITHOTRIPSY  2005   POLYPECTOMY  04/25/2020   Procedure: POLYPECTOMY;  Surgeon: [name], DO;  [facility];  Service: Endoscopy;;  colon   ROBOT ASSISTED PYELOPLASTY N/A 12/09/2013   Procedure: ROBOTIC ASSISTED BILATERAL PYELOLITHOTOMY, RIGHT  PYELOPLASTY ;  Surgeon: Alexis Frock, MD;  [facility];  Service: Urology;  Laterality: N/A;   Patient Active Problem List   Diagnosis Date Noted   Drug-induced myopathy 03/14/2020   Normocytic anemia 10/09/2019   Elevated LFTs    GERD (gastroesophageal reflux disease) 07/16/2019   Hyperkalemia 07/16/2019   Constipation 07/16/2019   Hyperlipidemia with target LDL less than 100 02/01/2015   [place] kidney stones 12/09/2013   Hypertension 10/13/2013   Diabetes ([place]) 09/10/2013   REFERRING PROVIDER: Nuala Alpha, MD  REFERRING DIAG: Low back pain, unspecified   Rationale for Evaluation and Treatment: Rehabilitation  THERAPY DIAG:  Pain in left thigh  Muscle weakness (generalized)  ONSET DATE: about 2 years ago  SUBJECTIVE:  SUBJECTIVE STATEMENT: Has some pain in L hip. Enough to be noticeable. Wil try bowling next week. Was released by MD.  PERTINENT HISTORY:  Hypertension, type 2 diabetes, myopathy  PAIN:  Are you having pain? Yes: NPRS scale: 3-4/10 Pain location: left hip and knee Pain description: sharp, sore, uncomfortable, dull Aggravating factors: sitting, walking (<5 minutes), standing (<5 minutes), or any pressure on his left hip or leg Relieving factors: medication  PRECAUTIONS: None  PATIENT GOALS: reduced pain, return to bowling, and be able to stand and walk longer  NEXT MD VISIT: 05/18/22  OBJECTIVE: All objective measures were completed at his initial evaluation on  04/23/22, unless otherwise noted  LUMBAR ROM:   AROM eval  Flexion 60  Extension 28  Right lateral flexion 25% limited  Left lateral flexion 25% limited  Right rotation 25% limited  Left rotation 25% limited   (Blank rows = not tested)  LOWER EXTREMITY ROM:     Active  Right eval Left eval  Hip flexion 100 103  Hip extension    Hip abduction  15; familiar sharp pain   Hip adduction    Hip internal rotation    Hip external rotation    Knee flexion    Knee extension    Ankle dorsiflexion    Ankle plantarflexion    Ankle inversion    Ankle eversion     (Blank rows = not tested)  LOWER EXTREMITY MMT:    MMT Right eval Left eval Left 12/14  Hip flexion 4/5 4-/5 4+/5  Hip extension     Hip abduction     Hip adduction     Hip internal rotation     Hip external rotation     Knee flexion 4+/5 3+/5 4/5  Knee extension 4-/5 3+/5 4+/5  Ankle dorsiflexion 3/5 3/5 4+/5  Ankle plantarflexion     Ankle inversion     Ankle eversion      (Blank rows = not tested)  FUNCTIONAL TESTS:  5 times sit to stand: 8 seconds with no UE support  TODAY'S TREATMENT:                                                                                                                              DATE:  05/17/22                                    EXERCISE LOG  Exercise Repetitions and Resistance Comments  Nustep L3 x 15 minutes   Step up onto BOSU 2 minutes   Knee extension 10# x30 reps   Cybex leg press 2 plates; seat 7 x30 reps   Hip abduction Red theraband x20 reps   Hip extension Red theraband x20 reps   Cybex knee flexion  50# x30 reps   Clam  Green theraband x30 reps   Sit to stands 8 seconds  x5 reps No UE support   Blank cell = exercise not performed today   PATIENT EDUCATION:  Education details: Plan of care, goals for therapy Person educated: Patient Education method: Explanation Education comprehension: verbalized understanding  HOME EXERCISE  PROGRAM:  ASSESSMENT:  CLINICAL IMPRESSION: Patient presented in clinic with no limitations with ADLs although pain can increase above 2/10 per patient report. Patient progressed through LE strengthening with VC for core activation. Patient reports compliance with HEP and resistance bands. LE MMT has improved since evaluation and met most goals. Patient advised to continue HEP and provided new HEP for stretching he can use prior to bowling events. Patient verbalized understanding of HEP instructions.   PHYSICAL THERAPY DISCHARGE SUMMARY  Visits from Start of Care: 6  Current functional level related to goals / functional outcomes: Patient was able to meet most of his goals for skilled physical therapy.    Remaining deficits: Pain   Education / Equipment: HEP    Patient agrees to discharge. Patient goals were partially met. Patient is being discharged due to being pleased with the current functional level.  Candi Leash, PT, DPT    OBJECTIVE IMPAIRMENTS: decreased activity tolerance, decreased mobility, difficulty walking, decreased ROM, decreased strength, hypomobility, impaired tone, and pain.   ACTIVITY LIMITATIONS: sitting, standing, stairs, transfers, and locomotion level  PARTICIPATION LIMITATIONS: driving, community activity, and occupation  PERSONAL FACTORS: Time since onset of injury/illness/exacerbation and 3+ comorbidities: Hypertension, type 2 diabetes, myopathy  are also affecting patient's functional outcome.   REHAB POTENTIAL: Fair    CLINICAL DECISION MAKING: Evolving/moderate complexity  EVALUATION COMPLEXITY: Moderate  GOALS: Goals reviewed with patient? Yes  SHORT TERM GOALS: Target date: 05/07/22  Patient will be independent with his initial HEP. Baseline: Goal status: MET  2.  Patient will be able to complete his daily activities without his pain exceeding 4/10. Baseline:  Goal status: MET  3.  Patient will improve his 5 time sit to stand to 15  seconds or less. Baseline:  Goal status: MET  LONG TERM GOALS: Target date: 05/21/22  Patient will be independent with his advanced HEP. Baseline:  Goal status: Unable to assess (new HEP provided 05/17/22)  2.  Patient will be able to complete his daily activities without his familiar pain exceeding 2/10. Baseline: 2-3/10 at most Goal status: Partially met  3.  Patient will improve his 5 times sit to stand to 12 seconds or less. Baseline: 13.55 seconds Goal status: MET  4.  Patient will report being able to walk at least 15 minutes without being limited by his familiar hip pain. Baseline:  Goal status: MET  PLAN:  PT FREQUENCY: 2x/week  PT DURATION: 4 weeks  PLANNED INTERVENTIONS: Therapeutic exercises, Therapeutic activity, Neuromuscular re-education, Balance training, Patient/Family education, Self Care, Joint mobilization, Stair training, Dry Needling, Electrical stimulation, Spinal mobilization, Cryotherapy, Moist heat, Vasopneumatic device, Manual therapy, and Re-evaluation.  PLAN FOR NEXT SESSION: On hold  Marvell Fuller, PTA 05/17/2022, 1:57 PM

## 2022-05-24 ENCOUNTER — Telehealth: Payer: Self-pay

## 2022-05-24 NOTE — Progress Notes (Signed)
  Chronic Care Management   Note  05/24/2022 Name: Philip Richardson MRN: [id number] DOB: Oct 31, 1955  Philip Richardson is a 66 y.o. year old male who is a primary care patient of Chevis Pretty, [place]. I reached out to [employer] by phone today in response to a referral sent by Philip Richardson's PCP.  Philip Richardson was given information about Chronic Care Management services today including:  CCM service includes personalized support from designated clinical staff supervised by the physician, including individualized plan of care and coordination with other care providers 24/7 contact phone numbers for assistance for urgent and routine care needs. Service will only be billed when office clinical staff spend 20 minutes or more in a month to coordinate care. Only one practitioner may furnish and bill the service in a calendar month. The patient may stop CCM services at amy time (effective at the end of the month) by phone call to the office staff. The patient will be responsible for cost sharing (co-pay) or up to 20% of the service fee (after annual deductible is met)  Philip Richardson  agreedto scheduling an appointment with the CCM RN Case Manager   Follow up plan: Patient agreed to scheduled appointment with RN Case Manager on 06/07/2022 and Pharm D 06/29/2022(date/time).   Philip Richardson, [name], [place] [address] Direct Dial: [phone] [email or web address]

## 2022-05-30 ENCOUNTER — Other Ambulatory Visit: Payer: Self-pay | Admitting: Nurse Practitioner

## 2022-05-30 DIAGNOSIS — I10 Essential (primary) hypertension: Secondary | ICD-10-CM

## 2022-06-05 ENCOUNTER — Encounter: Payer: Self-pay | Admitting: Nurse Practitioner

## 2022-06-05 DIAGNOSIS — N185 Chronic kidney disease, stage 5: Secondary | ICD-10-CM | POA: Insufficient documentation

## 2022-06-07 ENCOUNTER — Ambulatory Visit (INDEPENDENT_AMBULATORY_CARE_PROVIDER_SITE_OTHER): Payer: Medicare HMO | Admitting: *Deleted

## 2022-06-07 DIAGNOSIS — E119 Type 2 diabetes mellitus without complications: Secondary | ICD-10-CM

## 2022-06-07 DIAGNOSIS — I10 Essential (primary) hypertension: Secondary | ICD-10-CM

## 2022-06-07 NOTE — Chronic Care Management (AMB) (Signed)
Chronic Care Management   CCM RN Visit Note  06/07/2022 Name: Philip Richardson MRN: [id number] DOB: [birth year]  Subjective: Philip Richardson is a 67 y.o. year old male who is a primary care patient of Chevis Pretty, [place]. The patient was referred to the Chronic Care Management team for assistance with care management needs subsequent to provider initiation of CCM services and plan of care.    Today's Visit:  Engaged with patient by telephone for initial visit.     SDOH Interventions Today    Flowsheet Row Most Recent Value  SDOH Interventions   Food Insecurity Interventions Intervention Not Indicated  Housing Interventions Intervention Not Indicated  Transportation Interventions Intervention Not Indicated  Utilities Interventions Intervention Not Indicated  Financial Strain Interventions Intervention Not Indicated  Physical Activity Interventions Intervention Not Indicated  Stress Interventions Intervention Not Indicated  Social Connections Interventions Intervention Not Indicated         Goals Addressed             This Visit's Progress    CCM (CHRONIC KIDNEY DISEASE) EXPECTED OUTCOME: MONITOR, SELF-MANAGE AND REDUCE SYMPTOMS OF CHRONIC KIDNEY DISEASE       Current Barriers:  Knowledge Deficits related to Chronic Kidney Disease management Chronic Disease Management support and education needs related to CKD, diet No Advanced Directives in place- pt declines information Patient reports he lives alone, has girlfriend and sister he can call on if needed Patient reports he is independent with all aspects of his care Patient reports he does not follow a special diet  Planned Interventions: Assessed the patient     understanding of chronic kidney disease    Evaluation of current treatment plan related to chronic kidney disease self management and patient's adherence to plan as established by provider      Reviewed prescribed diet low sodium Reviewed medications with  patient and discussed importance of compliance    Counseled on the importance of exercise goals with target of 150 minutes per week     Advised patient, providing education and rationale, to monitor blood pressure daily and record, calling PCP for findings outside established parameters    Discussed complications of poorly controlled blood pressure such as heart disease, stroke, circulatory complications, vision complications, kidney impairment, sexual dysfunction    Discussed plans with patient for ongoing care management follow up and provided patient with direct contact information for care management team    Screening for signs and symptoms of depression related to chronic disease state      Assessed social determinant of health barriers     Symptom Management: Take medications as prescribed   Attend all scheduled provider appointments Call pharmacy for medication refills 3-7 days in advance of running out of medications Attend church or other social activities Perform all self care activities independently  Perform IADL's (shopping, preparing meals, housekeeping, managing finances) independently Call provider office for new concerns or questions  Look over education mailed- low sodium diet  Follow Up Plan: Telephone follow up appointment with care management team member scheduled for:  07/30/22 at 130 pm       CCM (DIABETES) EXPECTED OUTCOME:  MONITOR, SELF-MANAGE AND REDUCE SYMPTOMS OF DIABETES       Current Barriers:  Knowledge Deficits related to Diabetes management Chronic Disease Management support and education needs related to Diabetes, diet No Advanced Directives in place- pt declines information Patient reports he does not check CBG because he does not like finger stick (pt does have glucometer and knows how  to use), states he did have CGM and after awhile [employer] stopped paying for CGM, pt verbalizes understanding of appointment with [facility] pharmacist on 06/29/22 and intends to  discuss about how to start using CGM again Patient reports he does not follow a special diet and eats out daily, does not like a variety of vegetables  Planned Interventions: Provided education to patient about basic DM disease process; Reviewed medications with patient and discussed importance of medication adherence;        Reviewed prescribed diet with patient carbohydrate modified; Counseled on importance of regular laboratory monitoring as prescribed;        Discussed plans with patient for ongoing care management follow up and provided patient with direct contact information for care management team;      Provided patient with written educational materials related to hypo and hyperglycemia and importance of correct treatment;       Advised patient, providing education and rationale, to check cbg per doctor's order  and record        call provider for findings outside established parameters;       Review of patient status, including review of consultants reports, relevant laboratory and other test results, and medications completed;       Screening for signs and symptoms of depression related to chronic disease state;        Assessed social determinant of health barriers;        Reviewed effects elevated CBG has on the body including kidneys and eyes Reviewed importance of starting to check CBG again  Symptom Management: Take medications as prescribed   Attend all scheduled provider appointments Call pharmacy for medication refills 3-7 days in advance of running out of medications Attend church or other social activities Perform all self care activities independently  Perform IADL's (shopping, preparing meals, housekeeping, managing finances) independently Call provider office for new concerns or questions  check blood sugar at prescribed times: per doctor's order  check feet daily for cuts, sores or redness enter blood sugar readings and medication or insulin into daily log take  the blood sugar log to all doctor visits take the blood sugar meter to all doctor visits trim toenails straight across drink 6 to 8 glasses of water each day fill half of plate with vegetables limit fast food meals to no more than 1 per week manage portion size keep feet up while sitting Start checking blood sugar again Talk to pharmacist about continuous glucose monitoring Be mindful of your carbohydrate intake at each meal- too much rice, pasta, bread, potatoes, fruit at one meal can elevate your blood sugar  Follow Up Plan: Telephone follow up appointment with care management team member scheduled for:  07/30/22 at 130 pm          Plan:Telephone follow up appointment with care management team member scheduled for:  07/30/22 at 130 pm  Jacqlyn Larsen [facility], BSN RN Case Manager [place] [phone]

## 2022-06-07 NOTE — Plan of Care (Signed)
Chronic Care Management Provider Comprehensive Care Plan    06/07/2022 Name: Philip Richardson MRN: [id number] DOB: [birth year]  Referral to Chronic Care Management (CCM) services was placed by Provider:  Mary-Margaret Hassell Done FNP on Date: 05/14/22.  Chronic Condition 1: CHRONIC KIDNEY DISEASE Provider Assessment and Plan CKD since 2017-most likely before that/congenital issues as patient has horseshoe kidney CKD secondary to diabetes mellitus Possible contribution from low glomerular mass as patient has horseshoe kidney with bilateral renal atrophy/obstructive issues as patient has bilateral nephrolithiasis and pyelogram done in 2015 had shown patient had hydronephrosis Progression of CKD -I reviewed patient's creatinine/GFR data over the past few years.  Patient CKD has been marked with recent episode of AKI  Creat Trend 2023 2.3--2.4 2022 2.5--2.6 2021 1.3--2.8-AKI 2020 1.7 2019 1.7--1.8 2018 1.3--1.7 2017 1.3--1.4 2015 1.1--1.3  I educated patient about the reasons of the kidney disease/progression of the kidney disease. I answered patient's queries to the best of my ability ,patient voiced understanding.    Expected Outcome/Goals Addressed This Visit (Provider CCM goals/Provider Assessment and plan  CCM (CHRONIC KIDNEY DISEASE) EXPECTED OUTCOME: MONITOR, SELF-MANAGE AND REDUCE SYMPTOMS OF CHRONIC KIDNEY DISEASE  Symptom Management Condition 1: Take medications as prescribed   Attend all scheduled provider appointments Call pharmacy for medication refills 3-7 days in advance of running out of medications Attend church or other social activities Perform all self care activities independently  Perform IADL's (shopping, preparing meals, housekeeping, managing finances) independently Call provider office for new concerns or questions  Look over education mailed- low sodium diet  Chronic Condition 2: DIABETES Provider Assessment and Plan . Type 2 diabetes mellitus without  complication, with long-term current use of insulin ([place]) Stop toujeo Changed to lantus Will make appointment with clinical pharmacist - [facility] DCA Hb A1c Waived - insulin glargine (LANTUS SOLOSTAR) 100 UNIT/ML Solostar Pen; Inject 80 Units into the skin daily.  Dispense: 15 mL; Refill: 11 - dapagliflozin propanediol (FARXIGA) 10 MG TABS tablet; Take 1 tablet (10 mg total) by mouth daily.  Dispense: 90 tablet; Refill: 5   Expected Outcome/Goals Addressed This Visit (Provider CCM goals/Provider Assessment and plan  CCM (DIABETES) EXPECTED OUTCOME:  MONITOR, SELF-MANAGE AND REDUCE SYMPTOMS OF DIABETES  Symptom Management Condition 2: Take medications as prescribed   Attend all scheduled provider appointments Call pharmacy for medication refills 3-7 days in advance of running out of medications Attend church or other social activities Perform all self care activities independently  Perform IADL's (shopping, preparing meals, housekeeping, managing finances) independently Call provider office for new concerns or questions  check blood sugar at prescribed times: per doctor's order  check feet daily for cuts, sores or redness enter blood sugar readings and medication or insulin into daily log take the blood sugar log to all doctor visits take the blood sugar meter to all doctor visits trim toenails straight across drink 6 to 8 glasses of water each day fill half of plate with vegetables limit fast food meals to no more than 1 per week manage portion size keep feet up while sitting Start checking blood sugar again Talk to pharmacist about continuous glucose monitoring Be mindful of your carbohydrate intake at each meal- too much rice, pasta, bread, potatoes, fruit at one meal can elevate your blood sugar  Problem List Patient Active Problem List   Diagnosis Date Noted   CKD (chronic kidney disease) stage 5, GFR less than 15 ml/min (HCC) 06/05/2022   Drug-induced myopathy 03/14/2020    Normocytic anemia 10/09/2019  Elevated LFTs    GERD (gastroesophageal reflux disease) 07/16/2019   Hyperkalemia 07/16/2019   Constipation 07/16/2019   Hyperlipidemia with target LDL less than 100 02/01/2015   [place] kidney stones 12/09/2013   Hypertension 10/13/2013   Diabetes ([employer]) 09/10/2013    Medication Management  Current Outpatient Medications:    acetaminophen (TYLENOL) 325 MG tablet, Take 325-650 mg by mouth every 6 (six) hours as needed (for pain.)., Disp: , Rfl:    amLODipine (NORVASC) 10 MG tablet, Take 1 tablet (10 mg total) by mouth daily., Disp: 30 tablet, Rfl: 1   Blood Glucose Monitoring Suppl (ONETOUCH VERIO FLEX SYSTEM) w/Device KIT, Use to test blood sugar twice daily. DX E11.9, Disp: 1 kit, Rfl: 0   cloNIDine (CATAPRES) 0.1 MG tablet, Take 1 tablet (0.1 mg total) by mouth 3 (three) times daily., Disp: 90 tablet, Rfl: 1   dapagliflozin propanediol (FARXIGA) 10 MG TABS tablet, Take 1 tablet (10 mg total) by mouth daily., Disp: 90 tablet, Rfl: 5   ferrous sulfate 325 (65 FE) MG tablet, Take 1 tablet (325 mg total) by mouth daily with breakfast., Disp: 90 tablet, Rfl: 1   fluticasone (FLONASE) 50 MCG/ACT nasal spray, Place 2 sprays into both nostrils daily., Disp: 16 g, Rfl: 6   glimepiride (AMARYL) 4 MG tablet, TAKE (1) TABLET DAILY BEFORE BREAKFAST., Disp: 90 tablet, Rfl: 1   glucose blood (ONETOUCH VERIO) test strip, Use to test blood sugar twice daily. DX E11.9, Disp: 100 each, Rfl: 12   insulin glargine (LANTUS SOLOSTAR) 100 UNIT/ML Solostar Pen, Inject 80 Units into the skin daily., Disp: 15 mL, Rfl: 11   Insulin Pen Needle (PEN NEEDLES 31GX5/16") 31G X 8 MM MISC, Use to inject insulin daily as prescribed, Disp: 150 each, Rfl: 5   OneTouch Delica Lancets 01B MISC, Use to test blood sugar twice daily. DX E11.9, Disp: 100 each, Rfl: 3   pantoprazole (PROTONIX) 40 MG tablet, Take 1 tablet (40 mg total) by mouth 2 (two) times daily., Disp: 180 tablet, Rfl:  1  Cognitive Assessment Identity Confirmed: : Name; DOB Cognitive Status: Normal   Functional Assessment Hearing Difficulty or Deaf: no Wear Glasses or Blind: yes Vision Management: reading glasses Concentrating, Remembering or Making Decisions Difficulty (CP): no Difficulty Communicating: no Difficulty Eating/Swallowing: no Walking or Climbing Stairs Difficulty: no Dressing/Bathing Difficulty: no Doing Errands Independently Difficulty (such as shopping) (CP): no   Caregiver Assessment  Primary Source of Support/Comfort: significant other; sibling(s) Name of Support/Comfort Primary Source: girlfriend and patient's sister People in Home: alone   Planned Interventions  Provided education to patient about basic DM disease process; Reviewed medications with patient and discussed importance of medication adherence;        Reviewed prescribed diet with patient carbohydrate modified; Counseled on importance of regular laboratory monitoring as prescribed;        Discussed plans with patient for ongoing care management follow up and provided patient with direct contact information for care management team;      Provided patient with written educational materials related to hypo and hyperglycemia and importance of correct treatment;       Advised patient, providing education and rationale, to check cbg per doctor's order  and record        call provider for findings outside established parameters;       Review of patient status, including review of consultants reports, relevant laboratory and other test results, and medications completed;       Screening for signs and  symptoms of depression related to chronic disease state;        Assessed social determinant of health barriers;        Reviewed effects elevated CBG has on the body including kidneys and eyes Reviewed importance of starting to check CBG again Assessed the patient     understanding of chronic kidney disease    Evaluation of  current treatment plan related to chronic kidney disease self management and patient's adherence to plan as established by provider      Reviewed prescribed diet low sodium Reviewed medications with patient and discussed importance of compliance    Counseled on the importance of exercise goals with target of 150 minutes per week     Advised patient, providing education and rationale, to monitor blood pressure daily and record, calling PCP for findings outside established parameters    Discussed complications of poorly controlled blood pressure such as heart disease, stroke, circulatory complications, vision complications, kidney impairment, sexual dysfunction    Discussed plans with patient for ongoing care management follow up and provided patient with direct contact information for care management team    Screening for signs and symptoms of depression related to chronic disease state      Assessed social determinant of health barriers     Interaction and coordination with outside resources, practitioners, and providers See CCM Referral  Care Plan: Printed and mailed to patient- patient does not have a computer or access through his phone and does not check my chart

## 2022-06-07 NOTE — Patient Instructions (Signed)
Please call the care guide team at [phone] if you need to cancel or reschedule your appointment.   If you are experiencing a Mental Health or [place] or need someone to talk to, please call the Suicide and Crisis Lifeline: 988 call the [place] National Suicide Prevention Lifeline: 8382459086 or TTY: 438-846-9356 TTY 445-621-2043) to talk to a trained counselor call 1-800-273-TALK (toll free, 24 hour hotline) go to [facility] [address] [place] [phone]) call the [facility]: [phone] call 911   Following is a copy of the CCM Program Consent:  CCM service includes personalized support from designated clinical staff supervised by the physician, including individualized plan of care and coordination with other care providers 24/7 contact phone numbers for assistance for urgent and routine care needs. Service will only be billed when office clinical staff spend 20 minutes or more in a month to coordinate care. Only one practitioner may furnish and bill the service in a calendar month. The patient may stop CCM services at amy time (effective at the end of the month) by phone call to the office staff. The patient will be responsible for cost sharing (co-pay) or up to 20% of the service fee (after annual deductible is met)  Following is a copy of your full provider care plan:   Goals Addressed             This Visit's Progress    CCM (CHRONIC KIDNEY DISEASE) EXPECTED OUTCOME: MONITOR, SELF-MANAGE AND REDUCE SYMPTOMS OF CHRONIC KIDNEY DISEASE       Current Barriers:  Knowledge Deficits related to Chronic Kidney Disease management Chronic Disease Management support and education needs related to CKD, diet No Advanced Directives in place- pt declines information Patient reports he lives alone, has girlfriend and sister he can call on if needed Patient reports he is independent with all aspects of his  care Patient reports he does not follow a special diet  Planned Interventions: Assessed the patient     understanding of chronic kidney disease    Evaluation of current treatment plan related to chronic kidney disease self management and patient's adherence to plan as established by provider      Reviewed prescribed diet low sodium Reviewed medications with patient and discussed importance of compliance    Counseled on the importance of exercise goals with target of 150 minutes per week     Advised patient, providing education and rationale, to monitor blood pressure daily and record, calling PCP for findings outside established parameters    Discussed complications of poorly controlled blood pressure such as heart disease, stroke, circulatory complications, vision complications, kidney impairment, sexual dysfunction    Discussed plans with patient for ongoing care management follow up and provided patient with direct contact information for care management team    Screening for signs and symptoms of depression related to chronic disease state      Assessed social determinant of health barriers     Symptom Management: Take medications as prescribed   Attend all scheduled provider appointments Call pharmacy for medication refills 3-7 days in advance of running out of medications Attend church or other social activities Perform all self care activities independently  Perform IADL's (shopping, preparing meals, housekeeping, managing finances) independently Call provider office for new concerns or questions  Look over education mailed- low sodium diet  Follow Up Plan: Telephone follow up appointment with care management team member scheduled for:  07/30/22 at 130 pm  CCM (DIABETES) EXPECTED OUTCOME:  MONITOR, SELF-MANAGE AND REDUCE SYMPTOMS OF DIABETES       Current Barriers:  Knowledge Deficits related to Diabetes management Chronic Disease Management support and education needs  related to Diabetes, diet No Advanced Directives in place- pt declines information Patient reports he does not check CBG because he does not like finger stick (pt does have glucometer and knows how to use), states he did have CGM and after awhile [employer] stopped paying for CGM, pt verbalizes understanding of appointment with [facility] pharmacist on 06/29/22 and intends to discuss about how to start using CGM again Patient reports he does not follow a special diet and eats out daily, does not like a variety of vegetables  Planned Interventions: Provided education to patient about basic DM disease process; Reviewed medications with patient and discussed importance of medication adherence;        Reviewed prescribed diet with patient carbohydrate modified; Counseled on importance of regular laboratory monitoring as prescribed;        Discussed plans with patient for ongoing care management follow up and provided patient with direct contact information for care management team;      Provided patient with written educational materials related to hypo and hyperglycemia and importance of correct treatment;       Advised patient, providing education and rationale, to check cbg per doctor's order  and record        call provider for findings outside established parameters;       Review of patient status, including review of consultants reports, relevant laboratory and other test results, and medications completed;       Screening for signs and symptoms of depression related to chronic disease state;        Assessed social determinant of health barriers;        Reviewed effects elevated CBG has on the body including kidneys and eyes Reviewed importance of starting to check CBG again  Symptom Management: Take medications as prescribed   Attend all scheduled provider appointments Call pharmacy for medication refills 3-7 days in advance of running out of medications Attend church or other social  activities Perform all self care activities independently  Perform IADL's (shopping, preparing meals, housekeeping, managing finances) independently Call provider office for new concerns or questions  check blood sugar at prescribed times: per doctor's order  check feet daily for cuts, sores or redness enter blood sugar readings and medication or insulin into daily log take the blood sugar log to all doctor visits take the blood sugar meter to all doctor visits trim toenails straight across drink 6 to 8 glasses of water each day fill half of plate with vegetables limit fast food meals to no more than 1 per week manage portion size keep feet up while sitting Start checking blood sugar again Talk to pharmacist about continuous glucose monitoring Be mindful of your carbohydrate intake at each meal- too much rice, pasta, bread, potatoes, fruit at one meal can elevate your blood sugar  Follow Up Plan: Telephone follow up appointment with care management team member scheduled for:  07/30/22 at 130 pm          The patient verbalized understanding of instructions, educational materials, and care plan provided today and agreed to receive a mailed copy of patient instructions, educational materials, and care plan.   Telephone follow up appointment with care management team member scheduled for:   07/30/22 at 130 pm  Low-Sodium Eating Plan Sodium, which is an  element that makes up salt, helps you maintain a healthy balance of fluids in your body. Too much sodium can increase your blood pressure and cause fluid and waste to be held in your body. Your health care provider or dietitian may recommend following this plan if you have high blood pressure (hypertension), kidney disease, liver disease, or heart failure. Eating less sodium can help lower your blood pressure, reduce swelling, and protect your heart, liver, and kidneys. What are tips for following this plan? Reading food labels The  Nutrition Facts label lists the amount of sodium in one serving of the food. If you eat more than one serving, you must multiply the listed amount of sodium by the number of servings. Choose foods with less than 140 mg of sodium per serving. Avoid foods with 300 mg of sodium or more per serving. Shopping  Look for lower-sodium products, often labeled as "low-sodium" or "no salt added." Always check the sodium content, even if foods are labeled as "unsalted" or "no salt added." Buy fresh foods. Avoid canned foods and pre-made or frozen meals. Avoid canned, cured, or processed meats. Buy breads that have less than 80 mg of sodium per slice. Cooking  Eat more home-cooked food and less restaurant, buffet, and fast food. Avoid adding salt when cooking. Use salt-free seasonings or herbs instead of table salt or sea salt. Check with your health care provider or pharmacist before using salt substitutes. Cook with plant-based oils, such as canola, sunflower, or olive oil. Meal planning When eating at a restaurant, ask that your food be prepared with less salt or no salt, if possible. Avoid dishes labeled as brined, pickled, cured, smoked, or made with soy sauce, miso, or teriyaki sauce. Avoid foods that contain MSG (monosodium glutamate). MSG is sometimes added to [place] food, bouillon, and some canned foods. Make meals that can be grilled, baked, poached, roasted, or steamed. These are generally made with less sodium. General information Most people on this plan should limit their sodium intake to 1,500-2,000 mg (milligrams) of sodium each day. What foods should I eat? Fruits Fresh, frozen, or canned fruit. Fruit juice. Vegetables Fresh or frozen vegetables. "No salt added" canned vegetables. "No salt added" tomato sauce and paste. Low-sodium or reduced-sodium tomato and vegetable juice. Grains Low-sodium cereals, including oats, puffed wheat and rice, and shredded wheat. Low-sodium crackers.  Unsalted rice. Unsalted pasta. Low-sodium bread. Whole-grain breads and whole-grain pasta. Meats and other proteins Fresh or frozen (no salt added) meat, poultry, seafood, and fish. Low-sodium canned tuna and salmon. Unsalted nuts. Dried peas, beans, and lentils without added salt. Unsalted canned beans. Eggs. Unsalted nut butters. Dairy Milk. Soy milk. Cheese that is naturally low in sodium, such as ricotta cheese, fresh mozzarella, or Swiss cheese. Low-sodium or reduced-sodium cheese. Cream cheese. Yogurt. Seasonings and condiments Fresh and dried herbs and spices. Salt-free seasonings. Low-sodium mustard and ketchup. Sodium-free salad dressing. Sodium-free light mayonnaise. Fresh or refrigerated horseradish. Lemon juice. Vinegar. Other foods Homemade, reduced-sodium, or low-sodium soups. Unsalted popcorn and pretzels. Low-salt or salt-free chips. The items listed above may not be a complete list of foods and beverages you can eat. Contact a dietitian for more information. What foods should I avoid? Vegetables Sauerkraut, pickled vegetables, and relishes. Olives. [place] fries. Onion rings. Regular canned vegetables (not low-sodium or reduced-sodium). Regular canned tomato sauce and paste (not low-sodium or reduced-sodium). Regular tomato and vegetable juice (not low-sodium or reduced-sodium). Frozen vegetables in sauces. Grains Instant hot cereals. Bread stuffing, pancake, and  biscuit mixes. Croutons. Seasoned rice or pasta mixes. Noodle soup cups. Boxed or frozen macaroni and cheese. Regular salted crackers. Self-rising flour. Meats and other proteins Meat or fish that is salted, canned, smoked, spiced, or pickled. Precooked or cured meat, such as sausages or meat loaves. Berniece Salines. Ham. Pepperoni. Hot dogs. Corned beef. Chipped beef. Salt pork. Jerky. Pickled herring. Anchovies and sardines. Regular canned tuna. Salted nuts. Dairy Processed cheese and cheese spreads. Hard cheeses. Cheese curds.  Blue cheese. Feta cheese. String cheese. Regular cottage cheese. Buttermilk. Canned milk. Fats and oils Salted butter. Regular margarine. Ghee. Bacon fat. Seasonings and condiments Onion salt, garlic salt, seasoned salt, table salt, and sea salt. Canned and packaged gravies. Worcestershire sauce. Tartar sauce. Barbecue sauce. Teriyaki sauce. Soy sauce, including reduced-sodium. Steak sauce. Fish sauce. Oyster sauce. Cocktail sauce. Horseradish that you find on the shelf. Regular ketchup and mustard. Meat flavorings and tenderizers. Bouillon cubes. Hot sauce. Pre-made or packaged marinades. Pre-made or packaged taco seasonings. Relishes. Regular salad dressings. Salsa. Other foods Salted popcorn and pretzels. Corn chips and puffs. Potato and tortilla chips. Canned or dried soups. Pizza. Frozen entrees and pot pies. The items listed above may not be a complete list of foods and beverages you should avoid. Contact a dietitian for more information. Summary Eating less sodium can help lower your blood pressure, reduce swelling, and protect your heart, liver, and kidneys. Most people on this plan should limit their sodium intake to 1,500-2,000 mg (milligrams) of sodium each day. Canned, boxed, and frozen foods are high in sodium. Restaurant foods, fast foods, and pizza are also very high in sodium. You also get sodium by adding salt to food. Try to cook at home, eat more fresh fruits and vegetables, and eat less fast food and canned, processed, or prepared foods. This information is not intended to replace advice given to you by your health care provider. Make sure you discuss any questions you have with your health care provider. Document Revised: 06/26/2019 Document Reviewed: 04/22/2019 Elsevier Patient Education  [place]. Hypoglycemia Hypoglycemia is when the sugar (glucose) level in your blood is too low. Low blood sugar can happen to people who have diabetes and people who do not have  diabetes. Low blood sugar can happen quickly, and it can be an emergency. What are the causes? This condition happens most often in people who have diabetes. It may be caused by: Diabetes medicine. Not eating enough, or not eating often enough. Doing more physical activity. Drinking alcohol on an empty stomach. If you do not have diabetes, this condition may be caused by: A tumor in the pancreas. Not eating enough, or not eating for long periods at a time (fasting). A very bad infection or illness. Problems after having weight loss (bariatric) surgery. Kidney failure or liver failure. Certain medicines. What increases the risk? This condition is more likely to develop in people who: Have diabetes and take medicines to lower their blood sugar. Abuse alcohol. Have a very bad illness. What are the signs or symptoms? Mild Hunger. Sweating and feeling clammy. Feeling dizzy or light-headed. Being sleepy or having trouble sleeping. Feeling like you may vomit (nauseous). A fast heartbeat. A headache. Blurry vision. Mood changes, such as: Being grouchy. Feeling worried or nervous (anxious). Tingling or loss of feeling (numbness) around your mouth, lips, or tongue. Moderate Confusion and poor judgment. Behavior changes. Weakness. Uneven heartbeat. Trouble with moving (coordination). Very low Very low blood sugar (severe hypoglycemia) is a medical emergency. It  can cause: Fainting. Seizures. Loss of consciousness (coma). Death. How is this treated? Treating low blood sugar Low blood sugar is often treated by eating or drinking something that has sugar in it right away. The food or drink should contain 15 grams of a fast-acting carb (carbohydrate). Options include: 4 oz (120 mL) of fruit juice. 4 oz (120 mL) of regular soda (not diet soda). A few pieces of hard candy. Check food labels to see how many pieces to eat for 15 grams. 1 Tbsp (15 mL) of sugar or honey. 4 glucose  tablets. 1 tube of glucose gel. Treating low blood sugar if you have diabetes If you can think clearly and swallow safely, follow the 15:15 rule: Take 15 grams of a fast-acting carb. Talk with your doctor about how much you should take. Always keep a source of fast-acting carb with you, such as: Glucose tablets (take 4 tablets). A few pieces of hard candy. Check food labels to see how many pieces to eat for 15 grams. 4 oz (120 mL) of fruit juice. 4 oz (120 mL) of regular soda (not diet soda). 1 Tbsp (15 mL) of honey or sugar. 1 tube of glucose gel. Check your blood sugar 15 minutes after you take the carb. If your blood sugar is still at or below 70 mg/dL (3.9 mmol/L), take 15 grams of a carb again. If your blood sugar does not go above 70 mg/dL (3.9 mmol/L) after 3 tries, get help right away. After your blood sugar goes back to normal, eat a meal or a snack within 1 hour.  Treating very low blood sugar If your blood sugar is below 54 mg/dL (3 mmol/L), you have very low blood sugar, or severe hypoglycemia. This is an emergency. Get medical help right away. If you have very low blood sugar and you cannot eat or drink, you will need to be given a hormone called glucagon. A family member or friend should learn how to check your blood sugar and how to give you glucagon. Ask your doctor if you need to have an emergency glucagon kit at home. Very low blood sugar may also need to be treated in a hospital. Follow these instructions at home: General instructions Take over-the-counter and prescription medicines only as told by your doctor. Stay aware of your blood sugar as told by your doctor. If you drink alcohol: Limit how much you have to: 0-1 drink a day for women who are not pregnant. 0-2 drinks a day for men. Know how much alcohol is in your drink. In the [place] one drink equals one 12 oz bottle of beer (355 mL), one 5 oz glass of wine (148 mL), or one 1 oz glass of hard liquor (44 mL). Be  sure to eat food when you drink alcohol. Know that your body absorbs alcohol quickly. This may lead to low blood sugar later. Be sure to keep checking your blood sugar. Keep all follow-up visits. If you have diabetes:  Always have a fast-acting carb (15 grams) with you to treat low blood sugar. Follow your diabetes care plan as told by your doctor. Make sure you: Know the symptoms of low blood sugar. Check your blood sugar as often as told. Always check it before and after exercise. Always check your blood sugar before you drive. Take your medicines as told. Follow your meal plan. Eat on time. Do not skip meals. Share your diabetes care plan with: Your work or school. People you live with. Carry  a card or wear jewelry that says you have diabetes. Where to find more information American Diabetes Association: www.diabetes.org Contact a doctor if: You have trouble keeping your blood sugar in your target range. You have low blood sugar often. Get help right away if: You still have symptoms after you eat or drink something that contains 15 grams of fast-acting carb, and you cannot get your blood sugar above 70 mg/dL by following the 15:15 rule. Your blood sugar is below 54 mg/dL (3 mmol/L). You have a seizure. You faint. These symptoms may be an emergency. Get help right away. Call your local emergency services (911 in the [place] Do not wait to see if the symptoms will go away. Do not drive yourself to the hospital. Summary Hypoglycemia happens when the level of sugar (glucose) in your blood is too low. Low blood sugar can happen to people who have diabetes and people who do not have diabetes. Low blood sugar can happen quickly, and it can be an emergency. Make sure you know the symptoms of low blood sugar and know how to treat it. Always keep a source of sugar (fast-acting carb) with you to treat low blood sugar. This information is not intended to replace advice given to you by your  health care provider. Make sure you discuss any questions you have with your health care provider. Document Revised: 04/21/2020 Document Reviewed: 04/21/2020 Elsevier Patient Education  [place].

## 2022-06-13 ENCOUNTER — Telehealth: Payer: Self-pay | Admitting: Nurse Practitioner

## 2022-06-13 NOTE — Telephone Encounter (Signed)
  Left message for patient to call back and schedule Medicare Annual Wellness Visit (AWV) to be completed by video or phone.  No hx of AWV eligible for AWVI per [facility] as of 04/04/2022  Please schedule at anytime with [facility]    Any questions, please call me at [phone]   Thank you,   [facility] Amb[facility] Clinical Support for [facility]. We Are. [facility] ??[phone] or ??[phone]

## 2022-06-29 ENCOUNTER — Ambulatory Visit: Payer: Medicare HMO | Admitting: Pharmacist

## 2022-06-29 ENCOUNTER — Telehealth: Payer: Self-pay | Admitting: Pharmacist

## 2022-06-29 VITALS — BP 125/80 | HR 78

## 2022-06-29 DIAGNOSIS — G720 Drug-induced myopathy: Secondary | ICD-10-CM

## 2022-06-29 DIAGNOSIS — E119 Type 2 diabetes mellitus without complications: Secondary | ICD-10-CM

## 2022-06-29 DIAGNOSIS — E785 Hyperlipidemia, unspecified: Secondary | ICD-10-CM

## 2022-06-29 NOTE — Telephone Encounter (Signed)
Please email me entire apps for PAP Tresiba [id number] 100 units daily Needles #300 Farxiga '10mg'$  daily  Thank you! Patient coming here to sign and I will assist

## 2022-07-03 ENCOUNTER — Other Ambulatory Visit (HOSPITAL_COMMUNITY): Payer: Self-pay

## 2022-07-03 MED ORDER — DAPAGLIFLOZIN PROPANEDIOL 10 MG PO TABS: 10.0000 mg | ORAL_TABLET | Freq: Every day | ORAL | 5 refills | Status: DC

## 2022-07-03 NOTE — Progress Notes (Signed)
Chronic Care Management Pharmacy Note  06/29/2022 Name:  Philip Richardson MRN:  [id number] DOB:  17-Oct-1955  Summary:  Diabetes:  Uncontrolled-a1c 9.1%-->12-->13.3%, GFR 27; current treatment: Toujeo/tresiba 80-100 units (often missing doses--taking long acting, samples given), FARXIGA, GLIMEPIRIDE;  Patient could not afford rybelsus Will attempt to get patient assistance via novo nordisk PAP for ozempic, tresiba  Will attempt farxiga--patient assistance completed via [place] patient assistance program Tresiba--samples given-->60-80 units daily Libre CGM too expensive per patient report Current glucose readings: fasting glucose: n/a, post prandial glucose: n/a Not checking; unable to get dexcom cgm due to medicare requirements  Denies hypoglycemic/hyperglycemic symptoms Discussed meal planning options and Plate method for healthy eating Avoid sugary drinks and desserts Incorporate balanced protein, non starchy veggies, 1 serving of carbohydrate with each meal Increase water intake Increase physical activity as able Current exercise: n/a Recommended potential GLP1 switch, insulin switch, heart healthy diet/healthy plate method to aid in sugar and lipid control Assessed patient finances. Awaiting PAP  Hypertension--controlled; continue current regimen Hyperlipidemia--statin myopathy, LDL >140, cannot tolerate statin Will explore non-statin alternatives Lipid Panel     Component Value Date/Time   CHOL 214 (H) 05/14/2022 1404   TRIG 169 (H) 05/14/2022 1404   TRIG 100 04/21/2014 1455   HDL 39 (L) 05/14/2022 1404   HDL 46 04/21/2014 1455   CHOLHDL 5.5 (H) 05/14/2022 1404   LDLCALC 144 (H) 05/14/2022 1404   LDLCALC 103 (H) 01/13/2014 1342   LDLDIRECT 113 (H) 04/08/2018 1622   LABVLDL 31 05/14/2022 1404     Patient Goals/Self-Care Activities patient will:  - take medications as prescribed as evidenced by patient report and record review check glucose daily, document, and provide  at future appointments collaborate with provider on medication access solutions engage in dietary modifications by heart healthy diet/healthy plate method to aid in sugar and lipid control   Subjective: Philip Richardson is an 67 y.o. year old male who is a primary patient of Chevis Pretty, [place].  The patient was referred to the Chronic Care Management team for assistance with care management needs subsequent to provider initiation of CCM services and plan of care.    Engaged with patient by telephone for follow up visit in response to provider referral for CCM services.   Objective:  LABS:    Lab Results  Component Value Date   CREATININE 2.57 (H) 05/14/2022   CREATININE 2.45 (H) 09/19/2021   CREATININE 2.83 (H) 01/10/2021     Lab Results  Component Value Date   HGBA1C 13.3 (H) 05/14/2022         Component Value Date/Time   CHOL 214 (H) 05/14/2022 1404   TRIG 169 (H) 05/14/2022 1404   TRIG 100 04/21/2014 1455   HDL 39 (L) 05/14/2022 1404   HDL 46 04/21/2014 1455   CHOLHDL 5.5 (H) 05/14/2022 1404   LDLCALC 144 (H) 05/14/2022 1404   LDLCALC 103 (H) 01/13/2014 1342   LDLDIRECT 113 (H) 04/08/2018 1622     Clinical ASCVD: No   The 10-year ASCVD risk score (Arnett DK, et al., 2019) is: 32.1%   Values used to calculate the score:     Age: 79 years     Sex: Male     Is Non-Hispanic African American: No     Diabetic: Yes     Tobacco smoker: No     Systolic Blood Pressure: 034 mmHg     Is BP treated: Yes     HDL Cholesterol: 39 mg/dL  Total Cholesterol: 214 mg/dL    Other: (CHADS2VASc if Afib, PHQ9 if depression, MMRC or CAT for COPD, ACT, DEXA)    BP Readings from Last 3 Encounters:  07/03/22 125/80  05/14/22 130/71  11/16/21 129/71      SDOH:  (Social Determinants of Health) assessments and interventions performed:    Allergies  Allergen Reactions   Atorvastatin     Myopathy/weakness   Invokana [Canagliflozin] Other (See Comments)    weakness     Medications Reviewed Today     Reviewed by Lavera Guise, [facility] (Pharmacist) on 07/03/22 at 1245  Med List Status: <None>   Medication Order Taking? Sig Documenting Provider Last Dose Status Informant  acetaminophen (TYLENOL) 325 MG tablet [id number] No Take 325-650 mg by mouth every 6 (six) hours as needed (for pain.). [provider] Taking Active Self  amLODipine (NORVASC) 10 MG tablet [id number] No Take 1 tablet (10 mg total) by mouth daily. Hassell Done Mary-Margaret, FNP Taking Active   Blood Glucose Monitoring Suppl ([place]) w/Device KIT [id number] No Use to test blood sugar twice daily. DX E11.9 Chevis Pretty, FNP Taking Active Self  cloNIDine (CATAPRES) 0.1 MG tablet [id number] No Take 1 tablet (0.1 mg total) by mouth 3 (three) times daily. Hassell Done Mary-Margaret, FNP Taking Active   dapagliflozin propanediol (FARXIGA) 10 MG TABS tablet [id number]  Take 1 tablet (10 mg total) by mouth daily. Hassell Done, Mary-Margaret, FNP  Active   ferrous sulfate 325 (65 FE) MG tablet [id number] No Take 1 tablet (325 mg total) by mouth daily with breakfast. Hassell Done, Mary-Margaret, FNP Taking Active   fluticasone (FLONASE) 50 MCG/ACT nasal spray [id number] No Place 2 sprays into both nostrils daily. Hassell Done, Mary-Margaret, FNP Taking Active   glimepiride (AMARYL) 4 MG tablet [id number] No TAKE (1) TABLET DAILY BEFORE BREAKFAST. Hassell Done, Mary-Margaret, FNP Taking Active   glucose blood (ONETOUCH VERIO) test strip [id number] No Use to test blood sugar twice daily. DX E11.9 Chevis Pretty, FNP Taking Active Self  insulin glargine (LANTUS SOLOSTAR) 100 UNIT/ML Solostar Pen [id number] No Inject 80 Units into the skin daily. Hassell Done Mary-Margaret, FNP Taking Active   Insulin Pen Needle (PEN NEEDLES 31GX5/16") 31G X 8 MM MISC [id number] No Use to inject insulin daily as prescribed Chevis Pretty, FNP Taking Active   OneTouch Delica Lancets 38G MISC [id number] No Use to test blood sugar  twice daily. DX E11.9 Chevis Pretty, FNP Taking Active Self  pantoprazole (PROTONIX) 40 MG tablet [id number] No Take 1 tablet (40 mg total) by mouth 2 (two) times daily. Hassell Done Mary-Margaret, FNP Taking Active               Goals Addressed               This Visit's Progress     Patient Stated     T2DM PHARMD GOAL (pt-stated)        Current Barriers:  Unable to independently afford treatment regimen Unable to achieve control of T2DM, HLD Unable to maintain control of T2DM, HLD  Pharmacist Clinical Goal(s):  patient will verbalize ability to afford treatment regimen achieve control of T2DM as evidenced by GOAL<7% maintain control of T2DM as evidenced by GOAL<7%  through collaboration with PharmD and provider.    Interventions: 1:1 collaboration with Chevis Pretty, FNP regarding development and update of comprehensive plan of care as evidenced by provider attestation and co-signature Inter-disciplinary care team collaboration (see longitudinal plan of care) Comprehensive medication review performed; medication list updated in electronic medical record  Diabetes:  Uncontrolled-a1c 9.1%-->12-->13.3%, GFR 27; current treatment: Toujeo/tresiba 80-100 units (often missing doses--taking long acting, samples given), FARXIGA, GLIMEPIRIDE;  Patient could not afford rybelsus Will attempt to get patient assistance via novo nordisk PAP for ozempic, tresiba  Will attempt farxiga--patient assistance completed via az&me patient assistance program Tresiba--samples given-->60-80 units daily Libre CGM too expensive per patient report Current glucose readings: fasting glucose: n/a, post prandial glucose: n/a Not checking; unable to get dexcom cgm due to medicare requirements  Denies hypoglycemic/hyperglycemic symptoms Discussed meal planning options and Plate method for healthy eating Avoid sugary drinks and desserts Incorporate balanced protein, non starchy veggies, 1  serving of carbohydrate with each meal Increase water intake Increase physical activity as able Current exercise: n/a Recommended potential GLP1 switch, insulin switch, heart healthy diet/healthy plate method to aid in sugar and lipid control Assessed patient finances. Awaiting PAP  Hypertension--controlled; continue current regimen Hyperlipidemia--statin myopathy, LDL >140, cannot tolerate statin Will explore non-statin alternatives Lipid Panel     Component Value Date/Time   CHOL 214 (H) 05/14/2022 1404   TRIG 169 (H) 05/14/2022 1404   TRIG 100 04/21/2014 1455   HDL 39 (L) 05/14/2022 1404   HDL 46 04/21/2014 1455   CHOLHDL 5.5 (H) 05/14/2022 1404   LDLCALC 144 (H) 05/14/2022 1404   LDLCALC 103 (H) 01/13/2014 1342   LDLDIRECT 113 (H) 04/08/2018 1622   LABVLDL 31 05/14/2022 1404    Patient Goals/Self-Care Activities patient will:  - take medications as prescribed as evidenced by patient report and record review check glucose daily, document, and provide at future appointments collaborate with provider on medication access solutions engage in dietary modifications by heart healthy diet/healthy plate method to aid in sugar and lipid control         Plan: Telephone follow up appointment with care management team member scheduled for:  1 month      Regina Eck, PharmD, BCPS, BCACP Clinical Pharmacist, [place]  [facility]  T [phone]

## 2022-07-04 DIAGNOSIS — N185 Chronic kidney disease, stage 5: Secondary | ICD-10-CM

## 2022-07-04 DIAGNOSIS — Z794 Long term (current) use of insulin: Secondary | ICD-10-CM

## 2022-07-04 DIAGNOSIS — E1122 Type 2 diabetes mellitus with diabetic chronic kidney disease: Secondary | ICD-10-CM

## 2022-07-11 NOTE — Telephone Encounter (Signed)
Submitted application for [place] to [place] for patient assistance.   Phone: [phone]

## 2022-07-13 ENCOUNTER — Other Ambulatory Visit: Payer: Self-pay | Admitting: Nurse Practitioner

## 2022-07-13 DIAGNOSIS — I10 Essential (primary) hypertension: Secondary | ICD-10-CM

## 2022-07-16 ENCOUNTER — Telehealth: Payer: Self-pay | Admitting: Nurse Practitioner

## 2022-07-16 DIAGNOSIS — Z85828 Personal history of other malignant neoplasm of skin: Secondary | ICD-10-CM | POA: Diagnosis not present

## 2022-07-16 DIAGNOSIS — L570 Actinic keratosis: Secondary | ICD-10-CM | POA: Diagnosis not present

## 2022-07-16 DIAGNOSIS — Z08 Encounter for follow-up examination after completed treatment for malignant neoplasm: Secondary | ICD-10-CM | POA: Diagnosis not present

## 2022-07-16 DIAGNOSIS — X32XXXD Exposure to sunlight, subsequent encounter: Secondary | ICD-10-CM | POA: Diagnosis not present

## 2022-07-16 NOTE — Telephone Encounter (Signed)
  Left message for patient to call back and schedule Medicare Annual Wellness Visit (AWV) to be completed by video or phone.  No hx of AWV eligible for AWVI per [place] as of   Please schedule at anytime with [place]   Any questions, please call me at [phone]   Thank you,   [facility] for [place] [facility] ??[phone] or ??[phone]

## 2022-07-30 ENCOUNTER — Ambulatory Visit (INDEPENDENT_AMBULATORY_CARE_PROVIDER_SITE_OTHER): Payer: Medicare HMO | Admitting: *Deleted

## 2022-07-30 DIAGNOSIS — N185 Chronic kidney disease, stage 5: Secondary | ICD-10-CM

## 2022-07-30 DIAGNOSIS — Z794 Long term (current) use of insulin: Secondary | ICD-10-CM

## 2022-07-30 NOTE — Chronic Care Management (AMB) (Signed)
Chronic Care Management   CCM RN Visit Note  07/30/2022 Name: Philip Richardson MRN: [id number] DOB: 1955-11-07  Subjective: Delford Liechty is a 67 y.o. year old male who is a primary care patient of Chevis Pretty, [place]. The patient was referred to the Chronic Care Management team for assistance with care management needs subsequent to provider initiation of CCM services and plan of care.    Today's Visit:  Engaged with patient by telephone for follow up visit.        Goals Addressed             This Visit's Progress    CCM (CHRONIC KIDNEY DISEASE) EXPECTED OUTCOME: MONITOR, SELF-MANAGE AND REDUCE SYMPTOMS OF CHRONIC KIDNEY DISEASE       Current Barriers:  Knowledge Deficits related to Chronic Kidney Disease management Chronic Disease Management support and education needs related to CKD, diet No Advanced Directives in place- pt declines information Patient reports he lives alone, has girlfriend and sister he can call on if needed Patient reports he is independent with all aspects of his care Patient reports he does not follow a special diet, pt does exercise- walks 30 minutes daily  Planned Interventions: Evaluation of current treatment plan related to chronic kidney disease self management and patient's adherence to plan as established by provider      Provided education to patient re: stroke prevention, s/s of heart attack and stroke    Reviewed prescribed diet low sodium Reviewed medications with patient and discussed importance of compliance    Counseled on the importance of exercise goals with target of 150 minutes per week     Advised patient, providing education and rationale, to monitor blood pressure daily and record, calling PCP for findings outside established parameters    Discussed complications of poorly controlled blood pressure such as heart disease, stroke, circulatory complications, vision complications, kidney impairment, sexual dysfunction    Advised  patient to discuss any changes in health status, issues with medications with provider    Discussed plans with patient for ongoing care management follow up and provided patient with direct contact information for care management team    Screening for signs and symptoms of depression related to chronic disease state      Reviewed upcoming scheduled appointments including AWV 08/02/22 and primary care provider 08/13/22 at 230 pm  Symptom Management: Take medications as prescribed   Attend all scheduled provider appointments Call pharmacy for medication refills 3-7 days in advance of running out of medications Attend church or other social activities Perform all self care activities independently  Perform IADL's (shopping, preparing meals, housekeeping, managing finances) independently Call provider office for new concerns or questions  Follow low sodium diet Please view education mailed- stroke and heart attack  Follow Up Plan: Telephone follow up appointment with care management team member scheduled for:  10/15/22 at 215 pm       CCM (DIABETES) EXPECTED OUTCOME:  MONITOR, SELF-MANAGE AND REDUCE SYMPTOMS OF DIABETES       Current Barriers:  Knowledge Deficits related to Diabetes management Chronic Disease Management support and education needs related to Diabetes, diet No Advanced Directives in place- pt declines information Patient reports he does not check CBG because he does not like finger stick (pt does have glucometer and knows how to use), states he did have CGM and after awhile [employer] stopped paying for CGM, pt states he did discuss CGM with pharmacist and states " being checked on" Patient reports he does not follow  a special diet and eats out daily, does not like a variety of vegetables  Planned Interventions: Reviewed medications with patient and discussed importance of medication adherence;        Counseled on importance of regular laboratory monitoring as prescribed;         Advised patient, providing education and rationale, to check cbg per doctor's order  and record        call provider for findings outside established parameters;       Review of patient status, including review of consultants reports, relevant laboratory and other test results, and medications completed;       Reviewed effects elevated CBG has on the body including kidneys and eyes Reinforced importance of starting to check CBG again Reinforced carbohydrate modified diet  Symptom Management: Take medications as prescribed   Attend all scheduled provider appointments Call pharmacy for medication refills 3-7 days in advance of running out of medications Attend church or other social activities Perform all self care activities independently  Perform IADL's (shopping, preparing meals, housekeeping, managing finances) independently Call provider office for new concerns or questions  check blood sugar at prescribed times: per doctor's order  check feet daily for cuts, sores or redness enter blood sugar readings and medication or insulin into daily log take the blood sugar log to all doctor visits take the blood sugar meter to all doctor visits trim toenails straight across drink 6 to 8 glasses of water each day fill half of plate with vegetables limit fast food meals to no more than 1 per week manage portion size prepare main meal at home 3 to 5 days each week read food labels for fat, fiber, carbohydrates and portion size Start checking blood sugar again and keep a log Be mindful of your carbohydrate intake at each meal- too much rice, pasta, bread, potatoes, fruit at one meal can elevate your blood sugar  Follow Up Plan: Telephone follow up appointment with care management team member scheduled for:  10/15/22 at 215 pm          Plan:Telephone follow up appointment with care management team member scheduled for:  10/15/22 at 215 pm  Jacqlyn Larsen [facility], BSN RN Case Manager [place] [phone]

## 2022-07-30 NOTE — Patient Instructions (Signed)
Please call the care guide team at [phone] if you need to cancel or reschedule your appointment.   If you are experiencing a Mental Health or [place] or need someone to talk to, please call the Suicide and Crisis Lifeline: 988 call the [place] National Suicide Prevention Lifeline: (603)333-1699 or TTY: 4122631724 TTY 416 565 0780) to talk to a trained counselor call 1-800-273-TALK (toll free, 24 hour hotline) go to [facility] [address] [place] [phone]) call the [facility]: [phone] call 911   Following is a copy of the CCM Program Consent:  CCM service includes personalized support from designated clinical staff supervised by the physician, including individualized plan of care and coordination with other care providers 24/7 contact phone numbers for assistance for urgent and routine care needs. Service will only be billed when office clinical staff spend 20 minutes or more in a month to coordinate care. Only one practitioner may furnish and bill the service in a calendar month. The patient may stop CCM services at amy time (effective at the end of the month) by phone call to the office staff. The patient will be responsible for cost sharing (co-pay) or up to 20% of the service fee (after annual deductible is met)  Following is a copy of your full provider care plan:   Goals Addressed             This Visit's Progress    CCM (CHRONIC KIDNEY DISEASE) EXPECTED OUTCOME: MONITOR, SELF-MANAGE AND REDUCE SYMPTOMS OF CHRONIC KIDNEY DISEASE       Current Barriers:  Knowledge Deficits related to Chronic Kidney Disease management Chronic Disease Management support and education needs related to CKD, diet No Advanced Directives in place- pt declines information Patient reports he lives alone, has girlfriend and sister he can call on if needed Patient reports he is independent with all aspects of his  care Patient reports he does not follow a special diet, pt does exercise- walks 30 minutes daily  Planned Interventions: Evaluation of current treatment plan related to chronic kidney disease self management and patient's adherence to plan as established by provider      Provided education to patient re: stroke prevention, s/s of heart attack and stroke    Reviewed prescribed diet low sodium Reviewed medications with patient and discussed importance of compliance    Counseled on the importance of exercise goals with target of 150 minutes per week     Advised patient, providing education and rationale, to monitor blood pressure daily and record, calling PCP for findings outside established parameters    Discussed complications of poorly controlled blood pressure such as heart disease, stroke, circulatory complications, vision complications, kidney impairment, sexual dysfunction    Advised patient to discuss any changes in health status, issues with medications with provider    Discussed plans with patient for ongoing care management follow up and provided patient with direct contact information for care management team    Screening for signs and symptoms of depression related to chronic disease state      Reviewed upcoming scheduled appointments including [facility] 08/02/22 and primary care provider 08/13/22 at 230 pm  Symptom Management: Take medications as prescribed   Attend all scheduled provider appointments Call pharmacy for medication refills 3-7 days in advance of running out of medications Attend church or other social activities Perform all self care activities independently  Perform IADL's (shopping, preparing meals, housekeeping, managing finances) independently Call provider office for new concerns  or questions  Follow low sodium diet Please view education mailed- stroke and heart attack  Follow Up Plan: Telephone follow up appointment with care management team member scheduled for:   10/15/22 at 215 pm       CCM (DIABETES) EXPECTED OUTCOME:  MONITOR, SELF-MANAGE AND REDUCE SYMPTOMS OF DIABETES       Current Barriers:  Knowledge Deficits related to Diabetes management Chronic Disease Management support and education needs related to Diabetes, diet No Advanced Directives in place- pt declines information Patient reports he does not check CBG because he does not like finger stick (pt does have glucometer and knows how to use), states he did have CGM and after awhile [employer] stopped paying for CGM, pt states he did discuss CGM with pharmacist and states " being checked on" Patient reports he does not follow a special diet and eats out daily, does not like a variety of vegetables  Planned Interventions: Reviewed medications with patient and discussed importance of medication adherence;        Counseled on importance of regular laboratory monitoring as prescribed;        Advised patient, providing education and rationale, to check cbg per doctor's order  and record        call provider for findings outside established parameters;       Review of patient status, including review of consultants reports, relevant laboratory and other test results, and medications completed;       Reviewed effects elevated CBG has on the body including kidneys and eyes Reinforced importance of starting to check CBG again Reinforced carbohydrate modified diet  Symptom Management: Take medications as prescribed   Attend all scheduled provider appointments Call pharmacy for medication refills 3-7 days in advance of running out of medications Attend church or other social activities Perform all self care activities independently  Perform IADL's (shopping, preparing meals, housekeeping, managing finances) independently Call provider office for new concerns or questions  check blood sugar at prescribed times: per doctor's order  check feet daily for cuts, sores or redness enter blood sugar readings  and medication or insulin into daily log take the blood sugar log to all doctor visits take the blood sugar meter to all doctor visits trim toenails straight across drink 6 to 8 glasses of water each day fill half of plate with vegetables limit fast food meals to no more than 1 per week manage portion size prepare main meal at home 3 to 5 days each week read food labels for fat, fiber, carbohydrates and portion size Start checking blood sugar again and keep a log Be mindful of your carbohydrate intake at each meal- too much rice, pasta, bread, potatoes, fruit at one meal can elevate your blood sugar  Follow Up Plan: Telephone follow up appointment with care management team member scheduled for:  10/15/22 at 215 pm          Patient verbalizes understanding of instructions and care plan provided today and agrees to view in [place]. Active MyChart status and patient understanding of how to access instructions and care plan via MyChart confirmed with patient.     Telephone follow up appointment with care management team member scheduled for:   10/15/22 at 215 pm  Heart Attack - Warning Signs and What to Do A heart attack is a condition that occurs when your heart does not get enough oxygen. When this happens, the heart muscle begins to die. This can cause lasting damage if not treated right  away. A heart attack is a medical emergency. Be aware of the warning signs of a heart attack and act quickly. Risk Factors Your health care provider will talk with you about risk factors. These may include: Aging. Having a personal or family history of chest pain, heart attack, stroke, or narrowing of the arteries due to plaque buildup. Having taken chemotherapy or immune-suppressing medicines. Being male. Having a pregnancy history that includes pre-eclampsia. Being overweight or obese. Having any of these conditions: High blood pressure. High cholesterol. Diabetes. Certain lifestyle choices may  put you at a higher risk of a heart attack. These include: Drinking too much alcohol. Not getting regular exercise. Smoking or using tobacco products. Using street drugs. Warning signs or symptoms of a heart attack Heart damage can occur within the first few hours of a heart attack. This is why it is important to recognize early symptoms and get help right away. Warning signs and symptoms may include: Chest pain that may feel like: Crushing or squeezing. Tightness, pressure, fullness, or heaviness. Pain in the arm, neck, jaw, back, or upper body. Heartburn or upset stomach. Shortness of breath. Vomiting or nausea. Sudden sweating or clammy skin. Sudden light-headedness, dizziness, or passing out. Feeling tired (fatigue). Sudden feeling of anxiety. Fast or irregular heartbeat (palpitations). Heart attacks can be sudden and intense or can start slowly, with mild pain or discomfort. In some cases, heart attacks may happen with very mild symptoms or no symptoms at all (silent heart attacks). Silent heart attacks are more common in older adults or people with diabetes. Heart attack symptoms that can be mistaken for a less serious health issue Certain symptoms can be quickly associated with a heart attack, such as chest pain or pressure. However, there are other symptoms that may seem less serious or not related to a heart attack, but they actually are. These symptoms are called atypical symptoms. Be alert for the following atypical symptoms: Sharp pain in the side or chest that occurs with coughing or breathing. Shortness of breath or trouble breathing. Pain that spreads above the jaw or down into the body. Can symptoms differ between men and women? Heart attack symptoms can be different among men and women. Men may feel pain and numbness in the left side of their chest, while women feel it in the right side. Women may have signs and symptoms that may not be quickly associated with a heart  attack. They may be more subtle and sometimes confusing. These include: Nausea. Dizziness. Shortness of breath. Fatigue. Upper back pain that travels up the back, neck, and into the jaw. Where to find more information American Heart Association: heart.org Centers for Disease Control and Prevention: [employer]cy Get help right away if: You have any combination of these symptoms: Severe chest discomfort, especially if the pain is crushing or pressure-like and spreads to your arms, back, neck, or jaw. Discomfort in the chest, neck, arm, jaw, or back (angina) that lasts for longer than 5 minutes and medicine does not help. Palpitations. Trouble breathing or shortness of breath. Sudden sweating or clammy skin. Nausea or vomiting. Unexplained tiredness or weakness. These symptoms may be an emergency. Get help right away. Call 911. Do not wait to see if the symptoms will go away. Do not drive yourself to the hospital. This information is not intended to replace advice given to you by your health care provider. Make sure you discuss any questions you have with your health care provider. Document Revised: 11/16/2021 Document Reviewed: 11/16/2021  Elsevier Patient Education  [place]. Stroke Prevention Some medical conditions and lifestyle choices can lead to a higher risk for a stroke. You can help to prevent a stroke by eating healthy foods and exercising. It also helps to not smoke and to manage any health problems you may have. How can this condition affect me? A stroke is an emergency. It should be treated right away. A stroke can lead to brain damage or threaten your life. There is a better chance of surviving and getting better after a stroke if you get medical help right away. What can increase my risk? The following medical conditions may increase your risk of a stroke: Diseases of the heart and blood vessels (cardiovascular disease). High blood pressure  (hypertension). Diabetes. High cholesterol. Sickle cell disease. Problems with blood clotting. Being very overweight. Sleeping problems (obstructivesleep apnea). Other risk factors include: Being older than age 10. A history of blood clots, stroke, or mini-stroke (TIA). Race, ethnic background, or a family history of stroke. Smoking or using tobacco products. Taking birth control pills, especially if you smoke. Heavy alcohol and drug use. Not being active. What actions can I take to prevent this? Manage your health conditions High cholesterol. Eat a healthy diet. If this is not enough to manage your cholesterol, you may need to take medicines. Take medicines as told by your doctor. High blood pressure. Try to keep your blood pressure below 130/80. If your blood pressure cannot be managed through a healthy diet and regular exercise, you may need to take medicines. Take medicines as told by your doctor. Ask your doctor if you should check your blood pressure at home. Have your blood pressure checked every year. Diabetes. Eat a healthy diet and get regular exercise. If your blood sugar (glucose) cannot be managed through diet and exercise, you may need to take medicines. Take medicines as told by your doctor. Talk to your doctor about getting checked for sleeping problems. Signs of a problem can include: Snoring a lot. Feeling very tired. Make sure that you manage any other conditions you have. Nutrition  Follow instructions from your doctor about what to eat or drink. You may be told to: Eat and drink fewer calories each day. Limit how much salt (sodium) you use to 1,500 milligrams (mg) each day. Use only healthy fats for cooking, such as olive oil, canola oil, and sunflower oil. Eat healthy foods. To do this: Choose foods that are high in fiber. These include whole grains, and fresh fruits and vegetables. Eat at least 5 servings of fruits and vegetables a day. Try to fill  one-half of your plate with fruits and vegetables at each meal. Choose low-fat (lean) proteins. These include low-fat cuts of meat, chicken without skin, fish, tofu, beans, and nuts. Eat low-fat dairy products. Avoid foods that: Are high in salt. Have saturated fat. Have trans fat. Have cholesterol. Are processed or pre-made. Count how many carbohydrates you eat and drink each day. Lifestyle If you drink alcohol: Limit how much you have to: 0-1 drink a day for women who are not pregnant. 0-2 drinks a day for men. Know how much alcohol is in your drink. In the [place] one drink equals one 12 oz bottle of beer (345m), one 5 oz glass of wine (1490m, or one 1 oz glass of hard liquor (4456m Do not smoke or use any products that have nicotine or tobacco. If you need help quitting, ask your doctor. Avoid secondhand smoke. Do not use  drugs. Activity  Try to stay at a healthy weight. Get at least 30 minutes of exercise on most days, such as: Fast walking. Biking. Swimming. Medicines Take over-the-counter and prescription medicines only as told by your doctor. Avoid taking birth control pills. Talk to your doctor about the risks of taking birth control pills if: You are over 81 years old. You smoke. You get very bad headaches. You have had a blood clot. Where to find more information American Stroke Association: www.strokeassociation.org Get help right away if: You or a loved one has any signs of a stroke. "BE FAST" is an easy way to remember the warning signs: B - Balance. Dizziness, sudden trouble walking, or loss of balance. E - Eyes. Trouble seeing or a change in how you see. F - Face. Sudden weakness or loss of feeling of the face. The face or eyelid may droop on one side. A - Arms. Weakness or loss of feeling in an arm. This happens all of a sudden and most often on one side of the body. S - Speech. Sudden trouble speaking, slurred speech, or trouble understanding what people  say. T - Time. Time to call emergency services. Write down what time symptoms started. You or a loved one has other signs of a stroke, such as: A sudden, very bad headache with no known cause. Feeling like you may vomit (nausea). Vomiting. A seizure. These symptoms may be an emergency. Get help right away. Call your local emergency services (911 in the [place] Do not wait to see if the symptoms will go away. Do not drive yourself to the hospital. Summary You can help to prevent a stroke by eating healthy, exercising, and not smoking. It also helps to manage any health problems you have. Do not smoke or use any products that contain nicotine or tobacco. Get help right away if you or a loved one has any signs of a stroke. This information is not intended to replace advice given to you by your health care provider. Make sure you discuss any questions you have with your health care provider. Document Revised: 12/03/2019 Document Reviewed: 12/21/2019 Elsevier Patient Education  [place].

## 2022-07-31 ENCOUNTER — Other Ambulatory Visit: Payer: Self-pay | Admitting: Nurse Practitioner

## 2022-07-31 DIAGNOSIS — I10 Essential (primary) hypertension: Secondary | ICD-10-CM

## 2022-08-02 ENCOUNTER — Ambulatory Visit (INDEPENDENT_AMBULATORY_CARE_PROVIDER_SITE_OTHER): Payer: Medicare HMO

## 2022-08-02 VITALS — Ht 73.0 in | Wt 190.0 lb

## 2022-08-02 DIAGNOSIS — N185 Chronic kidney disease, stage 5: Secondary | ICD-10-CM

## 2022-08-02 DIAGNOSIS — Z0000 Encounter for general adult medical examination without abnormal findings: Secondary | ICD-10-CM | POA: Diagnosis not present

## 2022-08-02 DIAGNOSIS — E1122 Type 2 diabetes mellitus with diabetic chronic kidney disease: Secondary | ICD-10-CM

## 2022-08-02 DIAGNOSIS — Z794 Long term (current) use of insulin: Secondary | ICD-10-CM

## 2022-08-02 NOTE — Progress Notes (Signed)
Subjective:   Philip Richardson is a 67 y.o. male who presents for an Initial Medicare Annual Wellness Visit. I connected with  Philip Richardson on 08/02/22 by a audio enabled telemedicine application and verified that I am speaking with the correct person using two identifiers.  Patient Location: Home  Provider Location: Home Office  I discussed the limitations of evaluation and management by telemedicine. The patient expressed understanding and agreed to proceed.  Review of Systems     Cardiac Risk Factors include: advanced age (>63mn, >>50women);diabetes mellitus;male gender;hypertension;dyslipidemia     Objective:    Today's Vitals   08/02/22 1425  Weight: 190 lb (86.2 kg)  Height: '6\' 1"'$  (1.854 m)   Body mass index is 25.07 kg/m.     08/02/2022    2:28 PM 06/07/2022    1:11 PM 04/23/2022    2:51 PM 04/25/2020    9:02 AM 07/15/2019    6:00 PM 12/09/2013    4:09 PM 12/01/2013   11:33 AM  Advanced Directives  Does Patient Have a Medical Advance Directive? No No No No No Patient does not have advance directive Patient does not have advance directive;Patient would not like information  Would patient like information on creating a medical advance directive? No - Patient declined No - Patient declined  Yes (MAU/Ambulatory/Procedural Areas - Information given) No - Patient declined    Pre-existing out of facility DNR order (yellow form or pink MOST form)      No     Current Medications (verified) Outpatient Encounter Medications as of 08/02/2022  Medication Sig   acetaminophen (TYLENOL) 325 MG tablet Take 325-650 mg by mouth every 6 (six) hours as needed (for pain.).   amLODipine (NORVASC) 10 MG tablet TAKE ONE TABLET ONCE DAILY   Blood Glucose Monitoring Suppl (ONETOUCH VERIO FLEX SYSTEM) w/Device KIT Use to test blood sugar twice daily. DX E11.9   cloNIDine (CATAPRES) 0.1 MG tablet TAKE ONE TABLET THREE TIMES DAILY   dapagliflozin propanediol (FARXIGA) 10 MG TABS tablet Take 1  tablet (10 mg total) by mouth daily.   ferrous sulfate 325 (65 FE) MG tablet Take 1 tablet (325 mg total) by mouth daily with breakfast.   fluticasone (FLONASE) 50 MCG/ACT nasal spray Place 2 sprays into both nostrils daily.   glimepiride (AMARYL) 4 MG tablet TAKE (1) TABLET DAILY BEFORE BREAKFAST.   glucose blood (ONETOUCH VERIO) test strip Use to test blood sugar twice daily. DX E11.9   insulin glargine (LANTUS SOLOSTAR) 100 UNIT/ML Solostar Pen Inject 80 Units into the skin daily.   Insulin Pen Needle (PEN NEEDLES 31GX5/16") 31G X 8 MM MISC Use to inject insulin daily as prescribed   OneTouch Delica Lancets [id number] Use to test blood sugar twice daily. DX E11.9   pantoprazole (PROTONIX) 40 MG tablet Take 1 tablet (40 mg total) by mouth 2 (two) times daily.   No facility-administered encounter medications on file as of 08/02/2022.    Allergies (verified) Atorvastatin and Invokana [canagliflozin]   History: Past Medical History:  Diagnosis Date   Frequency of urination    GERD (gastroesophageal reflux disease)    Horseshoe kidney    BILATERAL   Hypertension    Renal calculus, bilateral    [place] 2 diabetes mellitus [place]    Urgency of urination    Wears dentures    Past Surgical History:  Procedure Laterality Date   BIOPSY  04/25/2020   Procedure: BIOPSY;  Surgeon: CEloise Harman DO;  Location: [facility];  Service: Endoscopy;;  duodenum gastric esophagus   COLONOSCOPY WITH PROPOFOL N/A 04/25/2020   internal hemorrhoids, one 5 mm polyp in descending colon. Tubular adenoma. 5 year surveillance.   CYSTOSCOPY W/ URETERAL STENT PLACEMENT Bilateral 12/09/2013   Procedure: CYSTOSCOPY WITH RETROGRADE PYELOGRAM/URETERAL STENT PLACEMENT;  Surgeon: Alexis Frock, MD;  Location: [facility];  Service: Urology;  Laterality: Bilateral;   CYSTOSCOPY WITH RETROGRADE PYELOGRAM, URETEROSCOPY AND STENT PLACEMENT Bilateral 09/30/2013   Procedure: CYSTOSCOPY WITH BILATERAL RETROGRADE PYELOGRAM,  LEFT DIAGNOSTIC URETEROSCOPY AND Left ureteral stent;  Surgeon: Alexis Frock, MD;  Location: [facility];  Service: Urology;  Laterality: Bilateral;   ESOPHAGOGASTRODUODENOSCOPY (EGD) WITH PROPOFOL N/A 04/25/2020   Mildly severe candida esophagitis without bleed, s/p biopsy. Suspicion for eosinophilic esophagitis but no increased eosinophils. Gastritis. Reactive gastropathy. Negative H.pylori.  +KOH prep.    PERCUTANEOUS NEPHROLITHOTRIPSY  2005   POLYPECTOMY  04/25/2020   Procedure: POLYPECTOMY;  Surgeon: [name], DO;  Location: [facility];  Service: Endoscopy;;  colon   ROBOT ASSISTED PYELOPLASTY N/A 12/09/2013   Procedure: ROBOTIC ASSISTED BILATERAL PYELOLITHOTOMY, RIGHT  PYELOPLASTY ;  Surgeon: Alexis Frock, MD;  Location: [facility];  Service: Urology;  Laterality: N/A;   Family History  Problem Relation Age of Onset   Cancer Mother    Colon cancer Neg Hx    Pancreatitis Neg Hx    Social History   Socioeconomic History   Marital status: Single    Spouse name: Not on file   Number of children: Not on file   Years of education: Not on file   Highest education level: Not on file  Occupational History   Not on file  Tobacco Use   Smoking status: Never   Smokeless tobacco: Never  Vaping Use   Vaping Use: Never used  Substance and Sexual Activity   Alcohol use: No    Comment: no history of etoh use   Drug use: No   Sexual activity: Not on file  Other Topics Concern   Not on file  Social History Narrative   Not on file   Social Determinants of Health   Financial Resource Strain: Low Risk  (08/02/2022)   Overall Financial Resource Strain (CARDIA)    Difficulty of Paying Living Expenses: Not hard at all  Food Insecurity: No Food Insecurity (08/02/2022)   Hunger Vital Sign    Worried About Running Out of Food in the Last Year: Never true    [place] in the Last Year: Never true  Transportation Needs: No Transportation Needs (08/02/2022)    PRAPARE - Hydrologist (Medical): No    Lack of Transportation (Non-Medical): No  Physical Activity: Insufficiently Active (08/02/2022)   Exercise Vital Sign    Days of Exercise per Week: 3 days    Minutes of Exercise per Session: 30 min  Stress: No Stress Concern Present (08/02/2022)   [place]    Feeling of Stress : Not at all  Social Connections: Moderately Isolated (08/02/2022)   Social Connection and Isolation Panel [NHANES]    Frequency of Communication with Friends and Family: More than three times a week    Frequency of Social Gatherings with Friends and Family: More than three times a week    Attends Religious Services: More than 4 times per year    Active Member of [employer] or Organizations: No    Attends Archivist Meetings:  Never    Marital Status: Never married    Tobacco Counseling Counseling given: Not Answered   Clinical Intake:  Pre-visit preparation completed: Yes  Pain : No/denies pain     Nutritional Risks: None Diabetes: No  How often do you need to have someone help you when you read instructions, pamphlets, or other written materials from your doctor or pharmacy?: 1 - Never  Diabetic?yes Nutrition Risk Assessment:  Has the patient had any N/V/D within the last 2 months?  No  Does the patient have any non-healing wounds?  No  Has the patient had any unintentional weight loss or weight gain?  No   Diabetes:  Is the patient diabetic?  Yes  If diabetic, was a CBG obtained today?  No  Did the patient bring in their glucometer from home?  No  How often do you monitor your CBG's? Never .   Financial Strains and Diabetes Management:  Are you having any financial strains with the device, your supplies or your medication? No .  Does the patient want to be seen by Chronic Care Management for management of their diabetes?  No  Would the patient like to  be referred to a Nutritionist or for Diabetic Management?  No   Diabetic Exams:  Diabetic Eye Exam: Completed 03/2022 Diabetic Foot Exam: Overdue, Pt has been advised about the importance in completing this exam. Pt is scheduled for diabetic foot exam on next office visit .   Interpreter Needed?: No  Information entered by :: Jadene Pierini, LPN   Activities of Daily Living    08/02/2022    2:28 PM  In your present state of health, do you have any difficulty performing the following activities:  Hearing? 0  Vision? 0  Difficulty concentrating or making decisions? 0  Walking or climbing stairs? 0  Dressing or bathing? 0  Doing errands, shopping? 0  Preparing Food and eating ? N  Using the Toilet? N  In the past six months, have you accidently leaked urine? N  Do you have problems with loss of bowel control? N  Managing your Medications? N  Managing your Finances? N  Housekeeping or managing your Housekeeping? N    Patient Care Team: Chevis Pretty, FNP as PCP - General (Nurse Practitioner) Lavera Guise, [facility] (Pharmacist) [name], DO as Consulting Physician (Internal Medicine) Kassie Mends, RN as [place] any recent Medical Services you may have received from other than [facility] providers in the past year (date may be approximate).     Assessment:   This is a routine wellness examination for Von.  Hearing/Vision screen Vision Screening - Comments:: Wears rx glasses - up to date with routine eye exams with  Dr.Lee   Dietary issues and exercise activities discussed: Current Exercise Habits: Home exercise routine, Type of exercise: walking, Time (Minutes): 30, Frequency (Times/Week): 3, Weekly Exercise (Minutes/Week): 90, Intensity: Mild, Exercise limited by: None identified   Goals Addressed             This Visit's Progress    Exercise 3x per week (30 min per time)         Depression Screen     08/02/2022    2:27 PM 06/07/2022    1:10 PM 05/14/2022    2:06 PM 11/16/2021    2:25 PM 09/19/2021    3:02 PM 04/25/2021    8:37 AM 03/23/2021    8:36 AM  PHQ 2/9 Scores  PHQ - 2 Score 0 0 0 0 0 0 0  PHQ- 9 Score   0 0  0 0    Fall Risk    08/02/2022    2:26 PM 06/07/2022   12:54 PM 05/14/2022    2:06 PM 11/16/2021    2:25 PM 09/19/2021    3:02 PM  Fall Risk   Falls in the past year? 0 0 0 0 0  Number falls in past yr: 0      Injury with Fall? 0      Risk for fall due to : No Fall Risks      Follow up Falls prevention discussed        [place]:  Any stairs in or around the home? Yes  If so, are there any without handrails? No  Home free of loose throw rugs in walkways, pet beds, electrical cords, etc? Yes  Adequate lighting in your home to reduce risk of falls? Yes   ASSISTIVE DEVICES UTILIZED TO PREVENT FALLS:  Life alert? No  Use of a cane, walker or w/c? No  Grab bars in the bathroom? No  Shower chair or bench in shower? No  Elevated toilet seat or a handicapped toilet? No          08/02/2022    2:28 PM  6CIT Screen  What Year? 0 points  What month? 0 points  What time? 0 points  Count back from 20 0 points  Months in reverse 0 points  Repeat phrase 0 points  Total Score 0 points    Immunizations Immunization History  Administered Date(s) Administered   Fluad Quad(high Dose 65+) 04/25/2021, 05/14/2022   Pneumococcal Polysaccharide-23 12/10/2013   Tdap 09/14/2010    TDAP status: Due, Education has been provided regarding the importance of this vaccine. Advised may receive this vaccine at local pharmacy or Health Dept. Aware to provide a copy of the vaccination record if obtained from local pharmacy or Health Dept. Verbalized acceptance and understanding.  Flu Vaccine status: Up to date  Pneumococcal vaccine status: Up to date  Covid-19 vaccine status: Completed vaccines  Qualifies for Shingles Vaccine? Yes   Zostavax  completed No   Shingrix Completed?: No.    Education has been provided regarding the importance of this vaccine. Patient has been advised to call insurance company to determine out of pocket expense if they have not yet received this vaccine. Advised may also receive vaccine at local pharmacy or Health Dept. Verbalized acceptance and understanding.  Screening Tests Health Maintenance  Topic Date Due   COVID-19 Vaccine (1) Never done   DTaP/Tdap/Td (2 - Td or Tdap) 09/13/2020   Fecal DNA (Cologuard)  02/08/2021   OPHTHALMOLOGY EXAM  07/29/2021   Zoster Vaccines- Shingrix (1 of 2) 08/13/2022 (Originally 04/06/1975)   Pneumonia Vaccine 12+ Years old (2 of 2 - PCV) 09/20/2022 (Originally 04/05/2021)   Diabetic kidney evaluation - Urine ACR  09/20/2022   HEMOGLOBIN A1C  11/13/2022   Diabetic kidney evaluation - eGFR measurement  05/15/2023   FOOT EXAM  05/15/2023   Medicare Annual Wellness (AWV)  08/02/2023   INFLUENZA VACCINE  Completed   Hepatitis C Screening  Completed   HPV VACCINES  Aged Out    Health Maintenance  Health Maintenance Due  Topic Date Due   COVID-19 Vaccine (1) Never done   DTaP/Tdap/Td (2 - Td or Tdap) 09/13/2020   Fecal DNA (Cologuard)  02/08/2021   OPHTHALMOLOGY EXAM  07/29/2021    Colorectal cancer screening: Type of screening: Colonoscopy. Completed 04/26/2020. Repeat every 3 years  Lung Cancer Screening: (Low Dose CT Chest recommended if Age 79-80 years, 30 pack-year currently smoking OR have quit w/in 15years.) does not qualify.   Lung Cancer Screening Referral: n/a  Additional Screening:  Hepatitis C Screening: does not qualify;   Vision Screening: Recommended annual ophthalmology exams for early detection of glaucoma and other disorders of the eye. Is the patient up to date with their annual eye exam?  Yes  Who is the provider or what is the name of the office in which the patient attends annual eye exams? Dr.Lee  If pt is not established with a  provider, would they like to be referred to a provider to establish care? Yes .   Dental Screening: Recommended annual dental exams for proper oral hygiene  Community Resource Referral / Chronic Care Management: CRR required this visit?  No   CCM required this visit?  No      Plan:     I have personally reviewed and noted the following in the patient's chart:   Medical and social history Use of alcohol, tobacco or illicit drugs  Current medications and supplements including opioid prescriptions. Patient is not currently taking opioid prescriptions. Functional ability and status Nutritional status Physical activity Advanced directives List of other physicians Hospitalizations, surgeries, and ER visits in previous 12 months Vitals Screenings to include cognitive, depression, and falls Referrals and appointments  In addition, I have reviewed and discussed with patient certain preventive protocols, quality metrics, and best practice recommendations. A written personalized care plan for preventive services as well as general preventive health recommendations were provided to patient.     Daphane Shepherd, LPN   [id number]   Nurse Notes: Due TDAP Vaccine , will discuss Colonoscopy with PCP

## 2022-08-02 NOTE — Patient Instructions (Signed)
Philip Richardson , Thank you for taking time to come for your Medicare Wellness Visit. I appreciate your ongoing commitment to your health goals. Please review the following plan we discussed and let me know if I can assist you in the future.   These are the goals we discussed:  Goals       CCM (CHRONIC KIDNEY DISEASE) EXPECTED OUTCOME: MONITOR, SELF-MANAGE AND REDUCE SYMPTOMS OF CHRONIC KIDNEY DISEASE      Current Barriers:  Knowledge Deficits related to Chronic Kidney Disease management Chronic Disease Management support and education needs related to CKD, diet No Advanced Directives in place- pt declines information Patient reports he lives alone, has girlfriend and sister he can call on if needed Patient reports he is independent with all aspects of his care Patient reports he does not follow a special diet, pt does exercise- walks 30 minutes daily  Planned Interventions: Evaluation of current treatment plan related to chronic kidney disease self management and patient's adherence to plan as established by provider      Provided education to patient re: stroke prevention, s/s of heart attack and stroke    Reviewed prescribed diet low sodium Reviewed medications with patient and discussed importance of compliance    Counseled on the importance of exercise goals with target of 150 minutes per week     Advised patient, providing education and rationale, to monitor blood pressure daily and record, calling PCP for findings outside established parameters    Discussed complications of poorly controlled blood pressure such as heart disease, stroke, circulatory complications, vision complications, kidney impairment, sexual dysfunction    Advised patient to discuss any changes in health status, issues with medications with provider    Discussed plans with patient for ongoing care management follow up and provided patient with direct contact information for care management team    Screening for signs  and symptoms of depression related to chronic disease state      Reviewed upcoming scheduled appointments including [facility] 08/02/22 and primary care provider 08/13/22 at 230 pm  Symptom Management: Take medications as prescribed   Attend all scheduled provider appointments Call pharmacy for medication refills 3-7 days in advance of running out of medications Attend church or other social activities Perform all self care activities independently  Perform IADL's (shopping, preparing meals, housekeeping, managing finances) independently Call provider office for new concerns or questions  Follow low sodium diet Please view education mailed- stroke and heart attack  Follow Up Plan: Telephone follow up appointment with care management team member scheduled for:  10/15/22 at 215 pm        CCM (DIABETES) EXPECTED OUTCOME:  MONITOR, SELF-MANAGE AND REDUCE SYMPTOMS OF DIABETES      Current Barriers:  Knowledge Deficits related to Diabetes management Chronic Disease Management support and education needs related to Diabetes, diet No Advanced Directives in place- pt declines information Patient reports he does not check CBG because he does not like finger stick (pt does have glucometer and knows how to use), states he did have CGM and after awhile [employer] stopped paying for CGM, pt states he did discuss CGM with pharmacist and states " being checked on" Patient reports he does not follow a special diet and eats out daily, does not like a variety of vegetables  Planned Interventions: Reviewed medications with patient and discussed importance of medication adherence;        Counseled on importance of regular laboratory monitoring as prescribed;  Advised patient, providing education and rationale, to check cbg per doctor's order  and record        call provider for findings outside established parameters;       Review of patient status, including review of consultants reports, relevant laboratory and  other test results, and medications completed;       Reviewed effects elevated CBG has on the body including kidneys and eyes Reinforced importance of starting to check CBG again Reinforced carbohydrate modified diet  Symptom Management: Take medications as prescribed   Attend all scheduled provider appointments Call pharmacy for medication refills 3-7 days in advance of running out of medications Attend church or other social activities Perform all self care activities independently  Perform IADL's (shopping, preparing meals, housekeeping, managing finances) independently Call provider office for new concerns or questions  check blood sugar at prescribed times: per doctor's order  check feet daily for cuts, sores or redness enter blood sugar readings and medication or insulin into daily log take the blood sugar log to all doctor visits take the blood sugar meter to all doctor visits trim toenails straight across drink 6 to 8 glasses of water each day fill half of plate with vegetables limit fast food meals to no more than 1 per week manage portion size prepare main meal at home 3 to 5 days each week read food labels for fat, fiber, carbohydrates and portion size Start checking blood sugar again and keep a log Be mindful of your carbohydrate intake at each meal- too much rice, pasta, bread, potatoes, fruit at one meal can elevate your blood sugar  Follow Up Plan: Telephone follow up appointment with care management team member scheduled for:  10/15/22 at 215 pm        Exercise 3x per week (30 min per time)      T2DM PHARMD GOAL (pt-stated)      Current Barriers:  Unable to independently afford treatment regimen Unable to achieve control of T2DM, HLD Unable to maintain control of T2DM, HLD  Pharmacist Clinical Goal(s):  patient will verbalize ability to afford treatment regimen achieve control of T2DM as evidenced by GOAL<7% maintain control of T2DM as evidenced by GOAL<7%   through collaboration with PharmD and provider.    Interventions: 1:1 collaboration with Chevis Pretty, FNP regarding development and update of comprehensive plan of care as evidenced by provider attestation and co-signature Inter-disciplinary care team collaboration (see longitudinal plan of care) Comprehensive medication review performed; medication list updated in electronic medical record  Diabetes:  Uncontrolled-a1c 9.1%-->12-->13.3%, GFR 27; current treatment: Toujeo/tresiba 80-100 units (often missing doses--taking long acting, samples given), FARXIGA, GLIMEPIRIDE;  Patient could not afford rybelsus Will attempt to get patient assistance via [employer] PAP for ozempic, tresiba  Will attempt farxiga--patient assistance completed via [place] patient assistance program Tresiba--samples given-->60-80 units daily Libre CGM too expensive per patient report Current glucose readings: fasting glucose: n/a, post prandial glucose: n/a Not checking; unable to get dexcom cgm due to medicare requirements  Denies hypoglycemic/hyperglycemic symptoms Discussed meal planning options and Plate method for healthy eating Avoid sugary drinks and desserts Incorporate balanced protein, non starchy veggies, 1 serving of carbohydrate with each meal Increase water intake Increase physical activity as able Current exercise: n/a Recommended potential GLP1 switch, insulin switch, heart healthy diet/healthy plate method to aid in sugar and lipid control Assessed patient finances. Awaiting PAP  Hypertension--controlled; continue current regimen Hyperlipidemia--statin myopathy, LDL >140, cannot tolerate statin Will explore non-statin alternatives Lipid Panel  Component Value Date/Time   CHOL 214 (H) 05/14/2022 1404   TRIG 169 (H) 05/14/2022 1404   TRIG 100 04/21/2014 1455   HDL 39 (L) 05/14/2022 1404   HDL 46 04/21/2014 1455   CHOLHDL 5.5 (H) 05/14/2022 1404   LDLCALC 144 (H) 05/14/2022 1404    LDLCALC 103 (H) 01/13/2014 1342   LDLDIRECT 113 (H) 04/08/2018 1622   LABVLDL 31 05/14/2022 1404    Patient Goals/Self-Care Activities patient will:  - take medications as prescribed as evidenced by patient report and record review check glucose daily, document, and provide at future appointments collaborate with provider on medication access solutions engage in dietary modifications by heart healthy diet/healthy plate method to aid in sugar and lipid control         This is a list of the screening recommended for you and due dates:  Health Maintenance  Topic Date Due   COVID-19 Vaccine (1) Never done   DTaP/Tdap/Td vaccine (2 - Td or Tdap) 09/13/2020   Cologuard (Stool DNA test)  02/08/2021   Eye exam for diabetics  07/29/2021   Zoster (Shingles) Vaccine (1 of 2) 08/13/2022*   Pneumonia Vaccine (2 of 2 - PCV) 09/20/2022*   Yearly kidney health urinalysis for diabetes  09/20/2022   Hemoglobin A1C  11/13/2022   Yearly kidney function blood test for diabetes  05/15/2023   Complete foot exam   05/15/2023   Medicare Annual Wellness Visit  08/02/2023   Flu Shot  Completed   Hepatitis C Screening: USPSTF Recommendation to screen - Ages 18-79 yo.  Completed   HPV Vaccine  Aged Out  *Topic was postponed. The date shown is not the original due date.    Advanced directives: Advance directive discussed with you today. I have provided a copy for you to complete at home and have notarized. Once this is complete please bring a copy in to our office so we can scan it into your chart.   Conditions/risks identified: Aim for 30 minutes of exercise or brisk walking, 6-8 glasses of water, and 5 servings of fruits and vegetables each day.   Next appointment: Follow up in one year for your annual wellness visit.   Preventive Care 73 Years and Older, Male  Preventive care refers to lifestyle choices and visits with your health care provider that can promote health and wellness. What does  preventive care include? A yearly physical exam. This is also called an annual well check. Dental exams once or twice a year. Routine eye exams. Ask your health care provider how often you should have your eyes checked. Personal lifestyle choices, including: Daily care of your teeth and gums. Regular physical activity. Eating a healthy diet. Avoiding tobacco and drug use. Limiting alcohol use. Practicing safe sex. Taking low doses of aspirin every day. Taking vitamin and mineral supplements as recommended by your health care provider. What happens during an annual well check? The services and screenings done by your health care provider during your annual well check will depend on your age, overall health, lifestyle risk factors, and family history of disease. Counseling  Your health care provider may ask you questions about your: Alcohol use. Tobacco use. Drug use. Emotional well-being. Home and relationship well-being. Sexual activity. Eating habits. History of falls. Memory and ability to understand (cognition). Work and work Statistician. Screening  You may have the following tests or measurements: Height, weight, and BMI. Blood pressure. Lipid and cholesterol levels. These may be checked every 5 years, or more  frequently if you are over 13 years old. Skin check. Lung cancer screening. You may have this screening every year starting at age 44 if you have a 30-pack-year history of smoking and currently smoke or have quit within the past 15 years. Fecal occult blood test (FOBT) of the stool. You may have this test every year starting at age 56. Flexible sigmoidoscopy or colonoscopy. You may have a sigmoidoscopy every 5 years or a colonoscopy every 10 years starting at age 82. Prostate cancer screening. Recommendations will vary depending on your family history and other risks. Hepatitis C blood test. Hepatitis B blood test. Sexually transmitted disease (STD) testing. Diabetes  screening. This is done by checking your blood sugar (glucose) after you have not eaten for a while (fasting). You may have this done every 1-3 years. Abdominal aortic aneurysm (AAA) screening. You may need this if you are a current or former smoker. Osteoporosis. You may be screened starting at age 9 if you are at high risk. Talk with your health care provider about your test results, treatment options, and if necessary, the need for more tests. Vaccines  Your health care provider may recommend certain vaccines, such as: Influenza vaccine. This is recommended every year. Tetanus, diphtheria, and acellular pertussis (Tdap, Td) vaccine. You may need a Td booster every 10 years. Zoster vaccine. You may need this after age 12. Pneumococcal 13-valent conjugate (PCV13) vaccine. One dose is recommended after age 47. Pneumococcal polysaccharide (PPSV23) vaccine. One dose is recommended after age 66. Talk to your health care provider about which screenings and vaccines you need and how often you need them. This information is not intended to replace advice given to you by your health care provider. Make sure you discuss any questions you have with your health care provider. Document Released: 06/17/2015 Document Revised: 02/08/2016 Document Reviewed: 03/22/2015 Elsevier Interactive Patient Education  2017 [place] Prevention in the Home Falls can cause injuries. They can happen to people of all ages. There are many things you can do to make your home safe and to help prevent falls. What can I do on the outside of my home? Regularly fix the edges of walkways and driveways and fix any cracks. Remove anything that might make you trip as you walk through a door, such as a raised step or threshold. Trim any bushes or trees on the path to your home. Use bright outdoor lighting. Clear any walking paths of anything that might make someone trip, such as rocks or tools. Regularly check to see if  handrails are loose or broken. Make sure that both sides of any steps have handrails. Any raised decks and porches should have guardrails on the edges. Have any leaves, snow, or ice cleared regularly. Use sand or salt on walking paths during winter. Clean up any spills in your garage right away. This includes oil or grease spills. What can I do in the bathroom? Use night lights. Install grab bars by the toilet and in the tub and shower. Do not use towel bars as grab bars. Use non-skid mats or decals in the tub or shower. If you need to sit down in the shower, use a plastic, non-slip stool. Keep the floor dry. Clean up any water that spills on the floor as soon as it happens. Remove soap buildup in the tub or shower regularly. Attach bath mats securely with double-sided non-slip rug tape. Do not have throw rugs and other things on the floor that can  make you trip. What can I do in the bedroom? Use night lights. Make sure that you have a light by your bed that is easy to reach. Do not use any sheets or blankets that are too big for your bed. They should not hang down onto the floor. Have a firm chair that has side arms. You can use this for support while you get dressed. Do not have throw rugs and other things on the floor that can make you trip. What can I do in the kitchen? Clean up any spills right away. Avoid walking on wet floors. Keep items that you use a lot in easy-to-reach places. If you need to reach something above you, use a strong step stool that has a grab bar. Keep electrical cords out of the way. Do not use floor polish or wax that makes floors slippery. If you must use wax, use non-skid floor wax. Do not have throw rugs and other things on the floor that can make you trip. What can I do with my stairs? Do not leave any items on the stairs. Make sure that there are handrails on both sides of the stairs and use them. Fix handrails that are broken or loose. Make sure that  handrails are as long as the stairways. Check any carpeting to make sure that it is firmly attached to the stairs. Fix any carpet that is loose or worn. Avoid having throw rugs at the top or bottom of the stairs. If you do have throw rugs, attach them to the floor with carpet tape. Make sure that you have a light switch at the top of the stairs and the bottom of the stairs. If you do not have them, ask someone to add them for you. What else can I do to help prevent falls? Wear shoes that: Do not have high heels. Have rubber bottoms. Are comfortable and fit you well. Are closed at the toe. Do not wear sandals. If you use a stepladder: Make sure that it is fully opened. Do not climb a closed stepladder. Make sure that both sides of the stepladder are locked into place. Ask someone to hold it for you, if possible. Clearly mark and make sure that you can see: Any grab bars or handrails. First and last steps. Where the edge of each step is. Use tools that help you move around (mobility aids) if they are needed. These include: Canes. Walkers. Scooters. Crutches. Turn on the lights when you go into a dark area. Replace any light bulbs as soon as they burn out. Set up your furniture so you have a clear path. Avoid moving your furniture around. If any of your floors are uneven, fix them. If there are any pets around you, be aware of where they are. Review your medicines with your doctor. Some medicines can make you feel dizzy. This can increase your chance of falling. Ask your doctor what other things that you can do to help prevent falls. This information is not intended to replace advice given to you by your health care provider. Make sure you discuss any questions you have with your health care provider. Document Released: 03/17/2009 Document Revised: 10/27/2015 Document Reviewed: 06/25/2014 Elsevier Interactive Patient Education  2017 [employer].

## 2022-08-03 ENCOUNTER — Other Ambulatory Visit: Payer: Medicare HMO

## 2022-08-03 DIAGNOSIS — R809 Proteinuria, unspecified: Secondary | ICD-10-CM | POA: Diagnosis not present

## 2022-08-03 DIAGNOSIS — N189 Chronic kidney disease, unspecified: Secondary | ICD-10-CM | POA: Diagnosis not present

## 2022-08-03 DIAGNOSIS — E1122 Type 2 diabetes mellitus with diabetic chronic kidney disease: Secondary | ICD-10-CM | POA: Diagnosis not present

## 2022-08-03 DIAGNOSIS — I129 Hypertensive chronic kidney disease with stage 1 through stage 4 chronic kidney disease, or unspecified chronic kidney disease: Secondary | ICD-10-CM | POA: Diagnosis not present

## 2022-08-03 DIAGNOSIS — E1129 Type 2 diabetes mellitus with other diabetic kidney complication: Secondary | ICD-10-CM | POA: Diagnosis not present

## 2022-08-03 DIAGNOSIS — Q631 Lobulated, fused and horseshoe kidney: Secondary | ICD-10-CM | POA: Diagnosis not present

## 2022-08-03 DIAGNOSIS — D638 Anemia in other chronic diseases classified elsewhere: Secondary | ICD-10-CM | POA: Diagnosis not present

## 2022-08-09 DIAGNOSIS — I129 Hypertensive chronic kidney disease with stage 1 through stage 4 chronic kidney disease, or unspecified chronic kidney disease: Secondary | ICD-10-CM | POA: Diagnosis not present

## 2022-08-09 DIAGNOSIS — N200 Calculus of kidney: Secondary | ICD-10-CM | POA: Diagnosis not present

## 2022-08-09 DIAGNOSIS — N189 Chronic kidney disease, unspecified: Secondary | ICD-10-CM | POA: Diagnosis not present

## 2022-08-09 DIAGNOSIS — R809 Proteinuria, unspecified: Secondary | ICD-10-CM | POA: Diagnosis not present

## 2022-08-09 DIAGNOSIS — E1122 Type 2 diabetes mellitus with diabetic chronic kidney disease: Secondary | ICD-10-CM | POA: Diagnosis not present

## 2022-08-09 DIAGNOSIS — Q631 Lobulated, fused and horseshoe kidney: Secondary | ICD-10-CM | POA: Diagnosis not present

## 2022-08-09 DIAGNOSIS — E1129 Type 2 diabetes mellitus with other diabetic kidney complication: Secondary | ICD-10-CM | POA: Diagnosis not present

## 2022-08-09 DIAGNOSIS — N1832 Chronic kidney disease, stage 3b: Secondary | ICD-10-CM | POA: Diagnosis not present

## 2022-08-13 ENCOUNTER — Encounter: Payer: Self-pay | Admitting: Nurse Practitioner

## 2022-08-13 ENCOUNTER — Ambulatory Visit (INDEPENDENT_AMBULATORY_CARE_PROVIDER_SITE_OTHER): Payer: Medicare HMO | Admitting: Nurse Practitioner

## 2022-08-13 VITALS — BP 110/60 | HR 63 | Temp 98.1°F | Resp 20 | Ht 73.0 in | Wt 193.0 lb

## 2022-08-13 DIAGNOSIS — D649 Anemia, unspecified: Secondary | ICD-10-CM

## 2022-08-13 DIAGNOSIS — Z794 Long term (current) use of insulin: Secondary | ICD-10-CM | POA: Diagnosis not present

## 2022-08-13 DIAGNOSIS — Z0001 Encounter for general adult medical examination with abnormal findings: Secondary | ICD-10-CM | POA: Diagnosis not present

## 2022-08-13 DIAGNOSIS — E875 Hyperkalemia: Secondary | ICD-10-CM

## 2022-08-13 DIAGNOSIS — E785 Hyperlipidemia, unspecified: Secondary | ICD-10-CM

## 2022-08-13 DIAGNOSIS — E119 Type 2 diabetes mellitus without complications: Secondary | ICD-10-CM | POA: Diagnosis not present

## 2022-08-13 DIAGNOSIS — I10 Essential (primary) hypertension: Secondary | ICD-10-CM | POA: Diagnosis not present

## 2022-08-13 DIAGNOSIS — Z0000 Encounter for general adult medical examination without abnormal findings: Secondary | ICD-10-CM | POA: Diagnosis not present

## 2022-08-13 DIAGNOSIS — Z23 Encounter for immunization: Secondary | ICD-10-CM

## 2022-08-13 DIAGNOSIS — K219 Gastro-esophageal reflux disease without esophagitis: Secondary | ICD-10-CM | POA: Diagnosis not present

## 2022-08-13 DIAGNOSIS — N185 Chronic kidney disease, stage 5: Secondary | ICD-10-CM

## 2022-08-13 DIAGNOSIS — R7989 Other specified abnormal findings of blood chemistry: Secondary | ICD-10-CM | POA: Diagnosis not present

## 2022-08-13 DIAGNOSIS — E1142 Type 2 diabetes mellitus with diabetic polyneuropathy: Secondary | ICD-10-CM | POA: Diagnosis not present

## 2022-08-13 LAB — BAYER DCA HB A1C WAIVED: HB A1C (BAYER DCA - WAIVED): 10.9 % — ABNORMAL HIGH (ref 4.8–5.6)

## 2022-08-13 MED ORDER — AMLODIPINE BESYLATE 10 MG PO TABS: 10.0000 mg | ORAL_TABLET | Freq: Every day | ORAL | 1 refills | Status: DC

## 2022-08-13 MED ORDER — FREESTYLE LIBRE 2 SENSOR MISC: 1.0000 | 5 refills | Status: DC

## 2022-08-13 MED ORDER — TRESIBA FLEXTOUCH 200 UNIT/ML ~~LOC~~ SOPN: 80.0000 [IU] | PEN_INJECTOR | Freq: Every day | SUBCUTANEOUS | 5 refills | Status: DC

## 2022-08-13 MED ORDER — FREESTYLE LIBRE 2 READER DEVI: 1.0000 | Freq: Every day | 0 refills | Status: DC

## 2022-08-13 MED ORDER — GLIMEPIRIDE 4 MG PO TABS: ORAL_TABLET | ORAL | 1 refills | Status: DC

## 2022-08-13 MED ORDER — DAPAGLIFLOZIN PROPANEDIOL 10 MG PO TABS: 10.0000 mg | ORAL_TABLET | Freq: Every day | ORAL | 1 refills | Status: DC

## 2022-08-13 NOTE — Patient Instructions (Signed)

## 2022-08-13 NOTE — Progress Notes (Signed)
Subjective:    Patient ID: Philip Richardson, male    DOB: 1955/07/05, 67 y.o.   MRN: [id number]   Chief Complaint:  annual physical  HPI:  Philip Richardson is a 67 y.o. who identifies as a male who was assigned male at birth.   Social history: Lives with: by hisself Work history: works at [employer] in today for follow up of the following chronic medical issues:  1. Primary hypertension No c/o chest pain, sob or headache does not check blood pressure at home.  BP Readings from Last 3 Encounters:  07/03/22 125/80  05/14/22 130/71  11/16/21 129/71     2. Hyperlipidemia with target LDL less than 100 Does not watch diet and does no exercise. Refuses statin therapy due to myalgia. Lab Results  Component Value Date   CHOL 214 (H) 05/14/2022   HDL 39 (L) 05/14/2022   LDLCALC 144 (H) 05/14/2022   LDLDIRECT 113 (H) 04/08/2018   TRIG 169 (H) 05/14/2022   CHOLHDL 5.5 (H) 05/14/2022   The 10-year ASCVD risk score (Arnett DK, et al., 2019) is: 26.5%   3. Hyperkalemia No c/o muscle cramps Lab Results  Component Value Date   K 4.7 05/14/2022     4. Gastroesophageal reflux disease, unspecified whether esophagitis present Takes protonix daily to prevent reflux symptoms  5. Type 2 diabetes mellitus without complication, with long-term current use of insulin (HCC) He does not like to check his blood sugars. He is not able to prick his finger. Needs to check blood sugars 3 x a day.  He saw clinical pharmacist about his diabetes on 07/30/22. He was changed to treisba 80 u daily. Lab Results  Component Value Date   HGBA1C 13.3 (H) 05/14/2022    6. CKD (chronic kidney disease) stage 5, GFR less than 15 ml/min (HCC) Lab Results  Component Value Date   CREATININE 2.57 (H) 05/14/2022  He saw nephrologist on 08/09/22. Review of office note- showed no change in plan of care. Discussed importance of diabetes control. May need to start him on sliding scale insulin if  HGBA1c is still elevated.   7. Normocytic anemia Lab Results  Component Value Date   HGB 12.9 (L) 05/14/2022     8. Elevated LFTs Lab Results  Component Value Date   ALT 28 05/14/2022   AST 20 05/14/2022   ALKPHOS 105 05/14/2022   BILITOT 0.4 05/14/2022      New complaints: None today  Allergies  Allergen Reactions   Atorvastatin     Myopathy/weakness   Invokana [Canagliflozin] Other (See Comments)    weakness   Outpatient Encounter Medications as of 08/13/2022  Medication Sig   acetaminophen (TYLENOL) 325 MG tablet Take 325-650 mg by mouth every 6 (six) hours as needed (for pain.).   amLODipine (NORVASC) 10 MG tablet TAKE ONE TABLET ONCE DAILY   Blood Glucose Monitoring Suppl (ONETOUCH VERIO FLEX SYSTEM) w/Device KIT Use to test blood sugar twice daily. DX E11.9   cloNIDine (CATAPRES) 0.1 MG tablet TAKE ONE TABLET THREE TIMES DAILY   dapagliflozin propanediol (FARXIGA) 10 MG TABS tablet Take 1 tablet (10 mg total) by mouth daily.   ferrous sulfate 325 (65 FE) MG tablet Take 1 tablet (325 mg total) by mouth daily with breakfast.   fluticasone (FLONASE) 50 MCG/ACT nasal spray Place 2 sprays into both nostrils daily.   glimepiride (AMARYL) 4 MG tablet TAKE (1) TABLET DAILY BEFORE BREAKFAST.   glucose blood (ONETOUCH VERIO) test  strip Use to test blood sugar twice daily. DX E11.9   insulin glargine (LANTUS SOLOSTAR) 100 UNIT/ML Solostar Pen Inject 80 Units into the skin daily.   Insulin Pen Needle (PEN NEEDLES 31GX5/16") 31G X 8 MM MISC Use to inject insulin daily as prescribed   OneTouch Delica Lancets 99991111 MISC Use to test blood sugar twice daily. DX E11.9   pantoprazole (PROTONIX) 40 MG tablet Take 1 tablet (40 mg total) by mouth 2 (two) times daily.   No facility-administered encounter medications on file as of 08/13/2022.    Past Surgical History:  Procedure Laterality Date   BIOPSY  04/25/2020   Procedure: BIOPSY;  Surgeon: [name], DO;  Location: [facility];  Service: Endoscopy;;  duodenum gastric esophagus   COLONOSCOPY WITH PROPOFOL N/A 04/25/2020   internal hemorrhoids, one 5 mm polyp in descending colon. Tubular adenoma. 5 year surveillance.   CYSTOSCOPY W/ URETERAL STENT PLACEMENT Bilateral 12/09/2013   Procedure: CYSTOSCOPY WITH RETROGRADE PYELOGRAM/URETERAL STENT PLACEMENT;  Surgeon: Alexis Frock, MD;  Location: [facility];  Service: Urology;  Laterality: Bilateral;   CYSTOSCOPY WITH RETROGRADE PYELOGRAM, URETEROSCOPY AND STENT PLACEMENT Bilateral 09/30/2013   Procedure: CYSTOSCOPY WITH BILATERAL RETROGRADE PYELOGRAM, LEFT DIAGNOSTIC URETEROSCOPY AND Left ureteral stent;  Surgeon: Alexis Frock, MD;  [facility];  Service: Urology;  Laterality: Bilateral;   ESOPHAGOGASTRODUODENOSCOPY (EGD) WITH PROPOFOL N/A 04/25/2020   Mildly severe candida esophagitis without bleed, s/p biopsy. Suspicion for eosinophilic esophagitis but no increased eosinophils. Gastritis. Reactive gastropathy. Negative H.pylori.  +KOH prep.    PERCUTANEOUS NEPHROLITHOTRIPSY  2005   POLYPECTOMY  04/25/2020   Procedure: POLYPECTOMY;  Surgeon: [name], DO;  [facility];  Service: Endoscopy;;  colon   ROBOT ASSISTED PYELOPLASTY N/A 12/09/2013   Procedure: ROBOTIC ASSISTED BILATERAL PYELOLITHOTOMY, RIGHT  PYELOPLASTY ;  Surgeon: Alexis Frock, MD;  Location: [facility];  Service: Urology;  Laterality: N/A;    Family History  Problem Relation Age of Onset   Cancer Mother    Colon cancer Neg Hx    Pancreatitis Neg Hx       Controlled substance contract: n/a     Review of Systems  Constitutional:  Negative for diaphoresis.  Eyes:  Negative for pain.  Respiratory:  Negative for shortness of breath.   Cardiovascular:  Negative for chest pain, palpitations and leg swelling.  Gastrointestinal:  Negative for abdominal pain.  Endocrine: Negative for polydipsia.  Skin:  Negative for rash.  Neurological:  Negative for  dizziness, weakness and headaches.  Hematological:  Does not bruise/bleed easily.  All other systems reviewed and are negative.      Objective:   Physical Exam Vitals and nursing note reviewed.  Constitutional:      Appearance: Normal appearance. He is well-developed.  HENT:     Head: Normocephalic.     Nose: Nose normal.     Mouth/Throat:     Mouth: Mucous membranes are moist.     Pharynx: Oropharynx is clear.  Eyes:     Pupils: Pupils are equal, round, and reactive to light.  Neck:     Thyroid: No thyroid mass or thyromegaly.     Vascular: No carotid bruit or JVD.     Trachea: Phonation normal.  Cardiovascular:     Rate and Rhythm: Normal rate and regular rhythm.  Pulmonary:     Effort: Pulmonary effort is normal. No respiratory distress.     Breath sounds: Normal breath sounds.  Abdominal:  General: Bowel sounds are normal.     Palpations: Abdomen is soft.     Tenderness: There is no abdominal tenderness.  Musculoskeletal:        General: Normal range of motion.     Cervical back: Normal range of motion and neck supple.  Lymphadenopathy:     Cervical: No cervical adenopathy.  Skin:    General: Skin is warm and dry.  Neurological:     Mental Status: He is alert and oriented to person, place, and time.  Psychiatric:        Behavior: Behavior normal.        Thought Content: Thought content normal.        Judgment: Judgment normal.     BP 110/60   Pulse 63   Temp 98.1 F (36.7 C) (Temporal)   Resp 20   Ht '6\' 1"'$  (1.854 m)   Wt 193 lb (87.5 kg)   SpO2 100%   BMI 25.46 kg/m   Hgba1c discussed at appointment 10.9%     Assessment & Plan:  Mauro Bouch comes in today with chief complaint of Medical Management of Chronic Issues   Diagnosis and orders addressed:  1. Annual physical exam - PSA, total and free  2. Primary hypertension Low sodium diet - CBC with Differential/Platelet - CMP14+EGFR - amLODipine (NORVASC) 10 MG tablet; Take 1 tablet  (10 mg total) by mouth daily.  Dispense: 90 tablet; Refill: 1  3. Hyperlipidemia with target LDL less than 100 Low fat diet - Lipid panel  4. Hyperkalemia Labs pending  5. Type 2 diabetes mellitus with diabetic polyneuropathy, with long-term current use of insulin (HCC) Continue tresiba as prescribed - Bayer DCA Hb A1c Waived - insulin degludec (TRESIBA FLEXTOUCH) 200 UNIT/ML FlexTouch Pen; Inject 80 Units into the skin daily.  Dispense: 15 mL; Refill: 5 - Continuous Blood Gluc Sensor (FREESTYLE LIBRE 2 SENSOR) MISC; 1 each by Does not apply route every 14 (fourteen) days.  Dispense: 2 each; Refill: 5 - Continuous Blood Gluc Receiver (FREESTYLE LIBRE 2 READER) DEVI; 1 each by Does not apply route daily.  Dispense: 1 each; Refill: 0 - glimepiride (AMARYL) 4 MG tablet; TAKE (1) TABLET DAILY BEFORE BREAKFAST.  Dispense: 90 tablet; Refill: 1 - dapagliflozin propanediol (FARXIGA) 10 MG TABS tablet; Take 1 tablet (10 mg total) by mouth daily.  Dispense: 90 tablet; Refill: 1  6. Gastroesophageal reflux disease, unspecified whether esophagitis present Avoid spicy foods Do not eat 2 hours prior to bedtime   7. CKD (chronic kidney disease) stage 5, GFR less than 15 ml/min (HCC) Keep follow  up with nephrologist  8. Normocytic anemia Labs pending  9. Elevated LFTs Labs pending   Labs pending Health Maintenance reviewed Diet and exercise encouraged  Follow up plan: 3 months   Mary-Margaret Hassell Done, FNP

## 2022-08-14 ENCOUNTER — Other Ambulatory Visit: Payer: Self-pay

## 2022-08-14 DIAGNOSIS — E785 Hyperlipidemia, unspecified: Secondary | ICD-10-CM

## 2022-08-14 LAB — CMP14+EGFR
ALT: 19 IU/L (ref 0–44)
AST: 11 IU/L (ref 0–40)
Albumin/Globulin Ratio: 1.8 (ref 1.2–2.2)
Albumin: 4.2 g/dL (ref 3.9–4.9)
Alkaline Phosphatase: 108 IU/L (ref 44–121)
BUN/Creatinine Ratio: 11 (ref 10–24)
BUN: 28 mg/dL — ABNORMAL HIGH (ref 8–27)
Bilirubin Total: 0.3 mg/dL (ref 0.0–1.2)
CO2: 21 mmol/L (ref 20–29)
Calcium: 9.6 mg/dL (ref 8.6–10.2)
Chloride: 103 mmol/L (ref 96–106)
Creatinine, Ser: 2.44 mg/dL — ABNORMAL HIGH (ref 0.76–1.27)
Globulin, Total: 2.4 g/dL (ref 1.5–4.5)
Glucose: 93 mg/dL (ref 70–99)
Potassium: 4.3 mmol/L (ref 3.5–5.2)
Sodium: 141 mmol/L (ref 134–144)
Total Protein: 6.6 g/dL (ref 6.0–8.5)
eGFR: 28 mL/min/{1.73_m2} — ABNORMAL LOW (ref >59)

## 2022-08-14 LAB — CBC WITH DIFFERENTIAL/PLATELET
Basophils Absolute: 0.1 10*3/uL (ref 0.0–0.2)
Basos: 1 %
EOS (ABSOLUTE): 0.1 10*3/uL (ref 0.0–0.4)
Eos: 1 %
Hematocrit: 39.5 % (ref 37.5–51.0)
Hemoglobin: 13.3 g/dL (ref 13.0–17.7)
Immature Grans (Abs): 0 10*3/uL (ref 0.0–0.1)
Immature Granulocytes: 0 %
Lymphocytes Absolute: 1.8 10*3/uL (ref 0.7–3.1)
Lymphs: 29 %
MCH: 27.5 pg (ref 26.6–33.0)
MCHC: 33.7 g/dL (ref 31.5–35.7)
MCV: 82 fL (ref 79–97)
Monocytes Absolute: 0.4 10*3/uL (ref 0.1–0.9)
Monocytes: 7 %
Neutrophils Absolute: 3.9 10*3/uL (ref 1.4–7.0)
Neutrophils: 62 %
Platelets: 210 10*3/uL (ref 150–450)
RBC: 4.84 x10E6/uL (ref 4.14–5.80)
RDW: 13.5 % (ref 11.6–15.4)
WBC: 6.2 10*3/uL (ref 3.4–10.8)

## 2022-08-14 LAB — PSA, TOTAL AND FREE
PSA, Free Pct: 55.3 %
PSA, Free: 0.94 ng/mL
Prostate Specific Ag, Serum: 1.7 ng/mL (ref 0.0–4.0)

## 2022-08-14 LAB — LIPID PANEL
Chol/HDL Ratio: 5.6 ratio — ABNORMAL HIGH (ref 0.0–5.0)
Cholesterol, Total: 219 mg/dL — ABNORMAL HIGH (ref 100–199)
HDL: 39 mg/dL — ABNORMAL LOW (ref >39)
LDL Chol Calc (NIH): 150 mg/dL — ABNORMAL HIGH (ref 0–99)
Triglycerides: 165 mg/dL — ABNORMAL HIGH (ref 0–149)
VLDL Cholesterol Cal: 30 mg/dL (ref 5–40)

## 2022-08-14 NOTE — Addendum Note (Signed)
Addended by: Rolena Infante on: 08/14/2022 03:28 PM   Modules accepted: Orders

## 2022-08-16 ENCOUNTER — Telehealth: Payer: Self-pay | Admitting: *Deleted

## 2022-08-16 MED ORDER — FARXIGA 10 MG PO TABS: 10.0000 mg | ORAL_TABLET | Freq: Every day | ORAL | 1 refills | Status: DC

## 2022-08-16 NOTE — Telephone Encounter (Signed)
Insurance doesn't cover generic Wilder Glade but no PA needed for Brand Name per fax.  Brand name Wilder Glade sent in for insurance to cover.

## 2022-08-17 ENCOUNTER — Telehealth: Payer: Self-pay

## 2022-08-17 NOTE — Progress Notes (Signed)
  Chronic Care Management   Note  08/17/2022 Name: Philip Richardson MRN: [id number] DOB: 1956/05/15  Philip Richardson is a 67 y.o. year old male who is a primary care patient of Chevis Pretty, [place]. I reached out to [employer] by phone today in response to a referral sent by Philip Richardson's PCP.  Philip Richardson was given information about Chronic Care Management services today including:  CCM service includes personalized support from designated clinical staff supervised by the physician, including individualized plan of care and coordination with other care providers 24/7 contact phone numbers for assistance for urgent and routine care needs. Service will only be billed when office clinical staff spend 20 minutes or more in a month to coordinate care. Only one practitioner may furnish and bill the service in a calendar month. The patient may stop CCM services at amy time (effective at the end of the month) by phone call to the office staff. The patient will be responsible for cost sharing (co-pay) or up to 20% of the service fee (after annual deductible is met)  Philip Richardson  agreedto scheduling an appointment with the CCM Pharmacist   Follow up plan: Patient agreed to scheduled appointment with Pharmacist on 09/21/2022(date/time).   Philip Richardson, [name], [name] [address] Direct Dial: [phone] [email or web address]

## 2022-08-17 NOTE — Progress Notes (Signed)
  Care Management   Outreach Note  08/17/2022 Name: Philip Richardson MRN: [id number] DOB: 1955-08-16  An unsuccessful telephone outreach was attempted today to contact the patient about Chronic Care Management needs.    Follow Up Plan:  A HIPAA compliant phone message was left for the patient providing contact information and requesting a return call.  The care management team will reach out to the patient again over the next 7 days.  If patient returns call to provider office, please advise to call [facility] * at [phone] Larsson, [place], [place] [address] Direct Dial: [phone] [email or web address]

## 2022-09-11 DIAGNOSIS — E1142 Type 2 diabetes mellitus with diabetic polyneuropathy: Secondary | ICD-10-CM | POA: Diagnosis not present

## 2022-09-21 ENCOUNTER — Ambulatory Visit (INDEPENDENT_AMBULATORY_CARE_PROVIDER_SITE_OTHER): Payer: Medicare HMO | Admitting: Pharmacist

## 2022-09-21 DIAGNOSIS — G720 Drug-induced myopathy: Secondary | ICD-10-CM

## 2022-09-21 DIAGNOSIS — E785 Hyperlipidemia, unspecified: Secondary | ICD-10-CM

## 2022-09-28 ENCOUNTER — Telehealth: Payer: Self-pay | Admitting: Pharmacist

## 2022-09-28 MED ORDER — REPATHA SURECLICK 140 MG/ML ~~LOC~~ SOAJ: 140.0000 mg | SUBCUTANEOUS | 4 refills | Status: DC

## 2022-09-28 NOTE — Telephone Encounter (Signed)
Hyperlipidemia--statin myopathy, LDL >140, cannot tolerate statin (atorvastatin, rosuvastatin) Will explore non-statin alternatives G72 coded Will need to apply for [employer] for Repatha

## 2022-09-28 NOTE — Progress Notes (Signed)
Chronic Care Management Pharmacy Note  09/21/2022 Name:  Philip Richardson MRN:  [id number] DOB:  Oct 26, 1955  Summary: Diabetes:  Uncontrolled-A1C 10.9%; GFR 28; current treatment: Tresiba 80 units (often misses doses), FARXIGA, starting Ozempic, told patient to stop glimepiride (not beneficial at this point) Will confirm patient is enrolled in Novo nordisk PAP for ozempic, tresiba  Will attempt farxiga--patient assistance completed via az&me patient assistance program [place] 2 CGM picked up and covered at [facility] Current glucose readings: fasting glucose: up in the low 200s, post prandial glucose: >200 Denies hypoglycemic/hyperglycemic symptoms Discussed meal planning options and Plate method for healthy eating Avoid sugary drinks and desserts Incorporate balanced protein, non starchy veggies, 1 serving of carbohydrate with each meal Increase water intake Increase physical activity as able Current exercise: n/a Recommended potential GLP1 switch, insulin switch, heart healthy diet/healthy plate method to aid in sugar and lipid control Assessed patient finances. Awaiting PAP  Hypertension--controlled; continue current regimen Hyperlipidemia--statin myopathy, LDL >140, cannot tolerate [id number] Will explore non-statin alternatives-->called in Repatha, but will need to do [employer]  Lipid Panel     Component Value Date/Time   CHOL 219 (H) 08/13/2022 1440   TRIG 165 (H) 08/13/2022 1440   TRIG 100 04/21/2014 1455   HDL 39 (L) 08/13/2022 1440   HDL 46 04/21/2014 1455   CHOLHDL 5.6 (H) 08/13/2022 1440   LDLCALC 150 (H) 08/13/2022 1440   LDLCALC 103 (H) 01/13/2014 1342   LDLDIRECT 113 (H) 04/08/2018 1622   LABVLDL 30 08/13/2022 1440   Patient Goals/Self-Care Activities patient will:  - take medications as prescribed as evidenced by patient report and record review check glucose daily, document, and provide at future appointments collaborate with provider on medication  access solutions engage in dietary modifications by heart healthy diet/healthy plate method to aid in sugar and lipid control   Subjective: Philip Richardson is an 67 y.o. year old male who is a primary patient of Philip Pierini, Richardson.  The patient was referred to the Chronic Care Management team for assistance with care management needs subsequent to provider initiation of CCM services and plan of care.    Engaged with patient by telephone for follow up visit in response to provider referral for CCM services.   Objective:  LABS:   Lab Results  Component Value Date   CREATININE 2.44 (H) 08/13/2022   CREATININE 2.57 (H) 05/14/2022   CREATININE 2.45 (H) 09/19/2021     Lab Results  Component Value Date   HGBA1C 10.9 (H) 08/13/2022         Component Value Date/Time   CHOL 219 (H) 08/13/2022 1440   TRIG 165 (H) 08/13/2022 1440   TRIG 100 04/21/2014 1455   HDL 39 (L) 08/13/2022 1440   HDL 46 04/21/2014 1455   CHOLHDL 5.6 (H) 08/13/2022 1440   LDLCALC 150 (H) 08/13/2022 1440   LDLCALC 103 (H) 01/13/2014 1342   LDLDIRECT 113 (H) 04/08/2018 1622     Clinical ASCVD: No   The 10-year ASCVD risk score (Arnett DK, et al., 2019) is: 26.8%   Values used to calculate the score:     Age: 78 years     Sex: Male     Is Non-Hispanic African American: No     Diabetic: Yes     Tobacco smoker: No     Systolic Blood Pressure: 110 mmHg     Is BP treated: Yes     HDL Cholesterol: 39 mg/dL     Total Cholesterol: 219 mg/dL  Other: (CHADS2VASc if Afib, PHQ9 if depression, MMRC or CAT for COPD, ACT, DEXA)    BP Readings from Last 3 Encounters:  08/13/22 110/60  07/03/22 125/80  05/14/22 130/71      SDOH:  (Social Determinants of Health) assessments and interventions performed:    Allergies  Allergen Reactions   Atorvastatin     Myopathy/weakness   Invokana [Canagliflozin] Other (See Comments)    weakness    Medications Reviewed Today     Reviewed by Philip Richardson, [facility]  (Pharmacist) on 09/28/22 at 1105  Med List Status: <None>   Medication Order Taking? Sig Documenting Provider Last Dose Status Informant  acetaminophen (TYLENOL) 325 MG tablet [id number] No Take 325-650 mg by mouth every 6 (six) hours as needed (for pain.). [provider] Taking Active Self  amLODipine (NORVASC) 10 MG tablet [id number]  Take 1 tablet (10 mg total) by mouth daily. Daphine Deutscher, Mary-Margaret, Richardson  Active   Blood Glucose Monitoring Suppl (ONETOUCH VERIO FLEX SYSTEM) w/Device KIT [id number] No Use to test blood sugar twice daily. DX E11.9 Philip Pierini, Richardson Taking Active Self  cloNIDine (CATAPRES) 0.1 MG tablet [id number] No TAKE ONE TABLET THREE TIMES DAILY Philip Pierini, Richardson Taking Active   Continuous Blood Gluc Receiver (FREESTYLE LIBRE 2 READER) DEVI [id number]  1 each by Does not apply route daily. Daphine Deutscher, Mary-Margaret, Richardson  Active   Continuous Blood Gluc Sensor (FREESTYLE LIBRE 2 SENSOR) [place] [id number]  1 each by Does not apply route every 14 (fourteen) days. Daphine Deutscher, Mary-Margaret, Richardson  Active   FARXIGA 10 MG TABS tablet [id number]  Take 1 tablet (10 mg total) by mouth daily before breakfast. Daphine Deutscher, Mary-Margaret, Richardson  Active   ferrous sulfate 325 (65 FE) MG tablet [id number] No Take 1 tablet (325 mg total) by mouth daily with breakfast. Daphine Deutscher, Mary-Margaret, Richardson Taking Active   fluticasone (FLONASE) 50 MCG/ACT nasal spray [id number] No Place 2 sprays into both nostrils daily. Daphine Deutscher, Mary-Margaret, Richardson Taking Active   glimepiride (AMARYL) 4 MG tablet [id number]  TAKE (1) TABLET DAILY BEFORE BREAKFAST. Daphine Deutscher, Mary-Margaret, Richardson  Active   glucose blood (ONETOUCH VERIO) test strip [id number] No Use to test blood sugar twice daily. DX E11.9 Philip Pierini, Richardson Taking Active Self  insulin degludec (TRESIBA FLEXTOUCH) 200 UNIT/ML FlexTouch Pen [id number]  Inject 80 Units into the skin daily. Daphine Deutscher, Mary-Margaret, Richardson  Active   Insulin Pen Needle (PEN NEEDLES  31GX5/16") 31G X 8 MM MISC [id number] No Use to inject insulin daily as prescribed Philip Pierini, Richardson Taking Active   OneTouch Delica Lancets 30G MISC [id number] No Use to test blood sugar twice daily. DX E11.9 Philip Pierini, Richardson Taking Active Self  pantoprazole (PROTONIX) 40 MG tablet [id number] No Take 1 tablet (40 mg total) by mouth 2 (two) times daily. Daphine Deutscher Mary-Margaret, Richardson Taking Active               Goals Addressed               This Visit's Progress     Patient Stated     T2DM PHARMD GOAL (pt-stated)        Current Barriers:  Unable to independently afford treatment regimen Unable to achieve control of T2DM, HLD Unable to maintain control of T2DM, HLD  Pharmacist Clinical Goal(s):  patient will verbalize ability to afford treatment regimen achieve control of T2DM as evidenced by GOAL<7% maintain control of T2DM as evidenced by GOAL<7%  through collaboration with PharmD and provider.   Interventions: 1:1  collaboration with Philip Pierini, Richardson regarding development and update of comprehensive plan of care as evidenced by provider attestation and co-signature Inter-disciplinary care team collaboration (see longitudinal plan of care) Comprehensive medication review performed; medication list updated in electronic medical record  Diabetes:  Uncontrolled-A1C 10.9%; GFR 28; current treatment: Tresiba 80 units (often misses doses), FARXIGA, starting Ozempic Will confirm patient is enrolled in Novo nordisk PAP for ozempic, tresiba  Will attempt farxiga--patient assistance completed via az&me patient assistance program [place] 2 CGM picked up and covered at [facility] Current glucose readings: fasting glucose: up in the low 200s, post prandial glucose: >200 Denies hypoglycemic/hyperglycemic symptoms Discussed meal planning options and Plate method for healthy eating Avoid sugary drinks and desserts Incorporate balanced protein, non starchy  veggies, 1 serving of carbohydrate with each meal Increase water intake Increase physical activity as able Current exercise: n/a Recommended potential GLP1 switch, insulin switch, heart healthy diet/healthy plate method to aid in sugar and lipid control Assessed patient finances. Awaiting PAP  Hypertension--controlled; continue current regimen Hyperlipidemia--statin myopathy, LDL >140, cannot tolerate [id number] Will explore non-statin alternatives-->called in Repatha, but will need to do [employer]  Lipid Panel     Component Value Date/Time   CHOL 219 (H) 08/13/2022 1440   TRIG 165 (H) 08/13/2022 1440   TRIG 100 04/21/2014 1455   HDL 39 (L) 08/13/2022 1440   HDL 46 04/21/2014 1455   CHOLHDL 5.6 (H) 08/13/2022 1440   LDLCALC 150 (H) 08/13/2022 1440   LDLCALC 103 (H) 01/13/2014 1342   LDLDIRECT 113 (H) 04/08/2018 1622   LABVLDL 30 08/13/2022 1440  Patient Goals/Self-Care Activities patient will:  - take medications as prescribed as evidenced by patient report and record review check glucose daily, document, and provide at future appointments collaborate with provider on medication access solutions engage in dietary modifications by heart healthy diet/healthy plate method to aid in sugar and lipid control         Plan: Telephone follow up appointment with care management team member scheduled for:  4 weeks      Kieth Brightly, PharmD, BCACP Clinical Pharmacist, [facility]

## 2022-10-02 DIAGNOSIS — E785 Hyperlipidemia, unspecified: Secondary | ICD-10-CM

## 2022-10-11 ENCOUNTER — Ambulatory Visit (INDEPENDENT_AMBULATORY_CARE_PROVIDER_SITE_OTHER): Payer: Medicare HMO | Admitting: Pharmacist

## 2022-10-11 DIAGNOSIS — E119 Type 2 diabetes mellitus without complications: Secondary | ICD-10-CM

## 2022-10-11 DIAGNOSIS — H5203 Hypermetropia, bilateral: Secondary | ICD-10-CM | POA: Diagnosis not present

## 2022-10-11 DIAGNOSIS — Z794 Long term (current) use of insulin: Secondary | ICD-10-CM

## 2022-10-11 DIAGNOSIS — G720 Drug-induced myopathy: Secondary | ICD-10-CM

## 2022-10-15 ENCOUNTER — Ambulatory Visit: Payer: Medicare HMO | Admitting: *Deleted

## 2022-10-15 DIAGNOSIS — N185 Chronic kidney disease, stage 5: Secondary | ICD-10-CM

## 2022-10-15 DIAGNOSIS — E1142 Type 2 diabetes mellitus with diabetic polyneuropathy: Secondary | ICD-10-CM

## 2022-10-15 NOTE — Patient Instructions (Signed)
Please call the care guide team at [phone] if you need to cancel or reschedule your appointment.   If you are experiencing a Mental Health or Behavioral Health Crisis or need someone to talk to, please call the Suicide and Crisis Lifeline: 988 call the [place] National Suicide Prevention Lifeline: 302-293-1363 or TTY: (805)257-2121 TTY (267)311-2030) to talk to a trained counselor call 1-800-273-TALK (toll free, 24 hour hotline) go to [facility] [address], [place] [phone]) call the [facility]: [phone] call 911   Following is a copy of the CCM Program Consent:  CCM service includes personalized support from designated clinical staff supervised by the physician, including individualized plan of care and coordination with other care providers 24/7 contact phone numbers for assistance for urgent and routine care needs. Service will only be billed when office clinical staff spend 20 minutes or more in a month to coordinate care. Only one practitioner may furnish and bill the service in a calendar month. The patient may stop CCM services at amy time (effective at the end of the month) by phone call to the office staff. The patient will be responsible for cost sharing (co-pay) or up to 20% of the service fee (after annual deductible is met)  Following is a copy of your full provider care plan:   Goals Addressed             This Visit's Progress    CCM (CHRONIC KIDNEY DISEASE) EXPECTED OUTCOME: MONITOR, SELF-MANAGE AND REDUCE SYMPTOMS OF CHRONIC KIDNEY DISEASE       Current Barriers:  Knowledge Deficits related to Chronic Kidney Disease management Chronic Disease Management support and education needs related to CKD, diet No Advanced Directives in place- pt declines information Patient reports he lives alone, has girlfriend and sister he can call on if needed Patient reports he is independent with all aspects of his  care Patient reports he does not follow a special diet, pt does exercise- walks 30 minutes daily Patient reports he recently followed up with nephrologist and "nothing was any worse"  Planned Interventions: Evaluation of current treatment plan related to chronic kidney disease self management and patient's adherence to plan as established by provider      Reviewed medications with patient and discussed importance of compliance    Counseled on the importance of exercise goals with target of 150 minutes per week     Advised patient, providing education and rationale, to monitor blood pressure daily and record, calling PCP for findings outside established parameters    Discussed complications of poorly controlled blood pressure such as heart disease, stroke, circulatory complications, vision complications, kidney impairment, sexual dysfunction    Advised patient to discuss any changes in health status, issues with medications with provider    Engage patient in early, proactive and ongoing discussion about goals of care and what matters most to them    Reviewed upcoming scheduled appointments including primary care provider 11/08/22 at 215 pm Reinforced instructions from nephrologist- Be careful with potassium intake, drink 2-2.5 liters of water to help prevent kidney stones, limit sodium to less than 2.3 grams per day, increase fruit and vegetable intake  Symptom Management: Take medications as prescribed   Attend all scheduled provider appointments Call pharmacy for medication refills 3-7 days in advance of running out of medications Attend church or other social activities Perform all self care activities independently  Perform IADL's (shopping, preparing meals, housekeeping, managing finances) independently Call provider office for  new concerns or questions  Follow low sodium diet Read food labels for sodium content and limit intake to less than 2.3 grams per day Drink 2- 2.5 liters of  water Be careful with potassium intake Increase fruit and vegetable intake  Follow Up Plan: Telephone follow up appointment with care management team member scheduled for:   12/31/22 at 3 pm       CCM (DIABETES) EXPECTED OUTCOME:  MONITOR, SELF-MANAGE AND REDUCE SYMPTOMS OF DIABETES       Current Barriers:  Knowledge Deficits related to Diabetes management Chronic Disease Management support and education needs related to Diabetes, diet No Advanced Directives in place- pt declines information Patient reports CBG is now monitored with [employer], reports fasting CBG is 50-60 almost every morning with CGM alarming, pt states he eats breakfast and CBG increases to around 100, reports random readings in 100's range and sometimes over 200, pt states he does not eat a bedtime snack and his last meal is dinner at 530-6 pm Patient reports he does not follow a special diet and eats out daily, does not like a variety of vegetables  Planned Interventions: Reviewed medications with patient and discussed importance of medication adherence;        Counseled on importance of regular laboratory monitoring as prescribed;        Advised patient, providing education and rationale, to check cbg per doctor's order  and record        call provider for findings outside established parameters;       Review of patient status, including review of consultants reports, relevant laboratory and other test results, and medications completed;       Reinforced effects elevated CBG has on the body including kidneys and eyes Reviewed carbohydrate modified diet Encouraged pt to eat a bedtime snack including carbohydrate and protein Reviewed signs/ symptoms hypoglycemia and interventions In basket message to primary care provider and pharmacist reporting hypoglycemic episodes in the morning  Symptom Management: Take medications as prescribed   Attend all scheduled provider appointments Call pharmacy for medication  refills 3-7 days in advance of running out of medications Attend church or other social activities Perform all self care activities independently  Perform IADL's (shopping, preparing meals, housekeeping, managing finances) independently Call provider office for new concerns or questions  check blood sugar at prescribed times: per doctor's order  check feet daily for cuts, sores or redness enter blood sugar readings and medication or insulin into daily log take the blood sugar log to all doctor visits take the blood sugar meter to all doctor visits trim toenails straight across drink 6 to 8 glasses of water each day fill half of plate with vegetables limit fast food meals to no more than 1 per week manage portion size prepare main meal at home 3 to 5 days each week read food labels for fat, fiber, carbohydrates and portion size set a realistic goal Be mindful of your carbohydrate intake at each meal- too much rice, pasta, bread, potatoes, fruit at one meal can elevate your blood sugar Try eating a bedtime snack including carbohydrate and protein Follow RULE OF 15 for low blood sugar management:  How to treat low blood sugars (Blood sugar less than 70 mg/dl  Please follow the RULE OF 15 for the treatment of hypoglycemia treatment (When your blood sugars are less than 70 mg/ dl) STEP  1:  Take 15 grams of carbohydrates when your blood sugar is low, which includes:   3-4  glucose tabs or  3-4 oz of juice or regular soda or  One tube of glucose gel STEP 2:  Recheck blood sugar in 15 minutes STEP 3:  If your blood sugar is still low at the 15 minute recheck ---then, go back to STEP 1 and treat again with another 15 grams of carbohydrates  Follow Up Plan: Telephone follow up appointment with care management team member scheduled for:  12/31/22 at 3 pm          Patient verbalizes understanding of instructions and care plan provided today and agrees to view in MyChart. Active MyChart status  and patient understanding of how to access instructions and care plan via MyChart confirmed with patient.  Telephone follow up appointment with care management team member scheduled for:  12/31/22 at 3 pm  Chronic Kidney Disease, Adult Chronic kidney disease is when lasting damage happens to the kidneys slowly over a long time. The kidneys help to: Make pee (urine). Make hormones. Keep the right amount of fluids and chemicals in the body. Most often, this disease does not go away. You must take steps to help keep the kidney damage from getting worse. If steps are not taken, the kidneys might stop working forever. What are the causes? Diabetes. High blood pressure. Diseases that affect the heart and blood vessels. Other kidney diseases. Diseases of the body's disease-fighting system. A problem with the flow of pee. Infections of the organs that make pee, store it, and take it out of the body. Swelling or irritation of your blood vessels. What increases the risk? Getting older. Having someone in your family who has kidney disease or kidney failure. Having a disease caused by genes. Taking medicines often that harm the kidneys. Being near or having contact with harmful substances. Being very overweight. Using tobacco now or in the past. What are the signs or symptoms? Feeling very tired. Having a swollen face, legs, ankles, or feet. Feeling like you may vomit or vomiting. Not feeling hungry. Being confused or not able to focus. Twitches and cramps in the leg muscles or other muscles. Dry, itchy skin. A taste of metal in your mouth. Making less pee, or making more pee. Shortness of breath. Trouble sleeping. You may also become anemic or get weak bones. Anemic means there is not enough red blood cells or hemoglobin in your blood. You may get symptoms slowly. You may not notice them until the kidney damage gets very bad. How is this treated? Often, there is no cure for this  disease. Treatment can help with symptoms and help keep the disease from getting worse. You may need to: Avoid alcohol. Avoid foods that are high in salt, potassium, phosphorous, and protein. Take medicines for symptoms and to help control other conditions. Have dialysis. This treatment gets harmful waste out of your body. Treat other problems that cause your kidney disease or make it worse. Follow these instructions at home: Medicines Take over-the-counter and prescription medicines only as told by your doctor. Do not take any new medicines, vitamins, or supplements unless your doctor says it is okay. Lifestyle  Do not smoke or use any products that contain nicotine or tobacco. If you need help quitting, ask your doctor. If you drink alcohol: Limit how much you use to: 0-1 drink a day for women who are not pregnant. 0-2 drinks a day for men. Know how much alcohol is in your drink. In the [place] one drink equals one 12 oz bottle of beer (355  mL), one 5 oz glass of wine (148 mL), or one 1 oz glass of hard liquor (44 mL). Stay at a healthy weight. If you need help losing weight, ask your doctor. General instructions  Follow instructions from your doctor about what you cannot eat or drink. Track your blood pressure at home. Tell your doctor about any changes. If you have diabetes, track your blood sugar. Exercise at least 30 minutes a day, 5 days a week. Keep your shots (vaccinations) up to date. Keep all follow-up visits. Where to find more information American Association of Kidney Patients: [employer]: www.kidney.org American Kidney Fund: FightingMatch.com.ee Life Options: www.lifeoptions.org Kidney School: www.kidneyschool.org Contact a doctor if: Your symptoms get worse. You get new symptoms. Get help right away if: You get symptoms of end-stage kidney disease. These include: Headaches. Losing feeling in your hands or feet. Easy bruising. Having  hiccups often. Chest pain. Shortness of breath. Lack of menstrual periods, in women. You have a fever. You make less pee than normal. You have pain or you bleed when you pee or poop. These symptoms may be an emergency. Get help right away. Call your local emergency services (911 in the [place] Do not wait to see if the symptoms will go away. Do not drive yourself to the hospital. Summary Chronic kidney disease is when lasting damage happens to the kidneys slowly over a long time. Causes of this disease include diabetes and high blood pressure. Often, there is no cure for this disease. Treatment can help symptoms and help keep the disease from getting worse. Treatment may involve lifestyle changes, medicines, and dialysis. This information is not intended to replace advice given to you by your health care provider. Make sure you discuss any questions you have with your health care provider. Document Revised: 08/26/2019 Document Reviewed: 08/26/2019 Elsevier Patient Education  2023 [employer].

## 2022-10-15 NOTE — Chronic Care Management (AMB) (Signed)
Chronic Care Management   CCM RN Visit Note  10/15/2022 Name: Philip Richardson MRN: [id number] DOB: 06-02-1956  Subjective: Philip Richardson is a 67 y.o. year old male who is a primary care patient of [name], FNP. The patient was referred to the Chronic Care Management team for assistance with care management needs subsequent to provider initiation of CCM services and plan of care.    Today's Visit:  Engaged with patient by telephone for follow up visit.        Goals Addressed             This Visit's Progress    CCM (CHRONIC KIDNEY DISEASE) EXPECTED OUTCOME: MONITOR, SELF-MANAGE AND REDUCE SYMPTOMS OF CHRONIC KIDNEY DISEASE       Current Barriers:  Knowledge Deficits related to Chronic Kidney Disease management Chronic Disease Management support and education needs related to CKD, diet No Advanced Directives in place- pt declines information Patient reports he lives alone, has girlfriend and sister he can call on if needed Patient reports he is independent with all aspects of his care Patient reports he does not follow a special diet, pt does exercise- walks 30 minutes daily Patient reports he recently followed up with nephrologist and "nothing was any worse"  Planned Interventions: Evaluation of current treatment plan related to chronic kidney disease self management and patient's adherence to plan as established by provider      Reviewed medications with patient and discussed importance of compliance    Counseled on the importance of exercise goals with target of 150 minutes per week     Advised patient, providing education and rationale, to monitor blood pressure daily and record, calling PCP for findings outside established parameters    Discussed complications of poorly controlled blood pressure such as heart disease, stroke, circulatory complications, vision complications, kidney impairment, sexual dysfunction    Advised patient to discuss any changes in health  status, issues with medications with provider    Engage patient in early, proactive and ongoing discussion about goals of care and what matters most to them    Reviewed upcoming scheduled appointments including primary care provider 11/08/22 at 215 pm Reinforced instructions from nephrologist- Be careful with potassium intake, drink 2-2.5 liters of water to help prevent kidney stones, limit sodium to less than 2.3 grams per day, increase fruit and vegetable intake  Symptom Management: Take medications as prescribed   Attend all scheduled provider appointments Call pharmacy for medication refills 3-7 days in advance of running out of medications Attend church or other social activities Perform all self care activities independently  Perform IADL's (shopping, preparing meals, housekeeping, managing finances) independently Call provider office for new concerns or questions  Follow low sodium diet Read food labels for sodium content and limit intake to less than 2.3 grams per day Drink 2- 2.5 liters of water Be careful with potassium intake Increase fruit and vegetable intake  Follow Up Plan: Telephone follow up appointment with care management team member scheduled for:   12/31/22 at 3 pm       CCM (DIABETES) EXPECTED OUTCOME:  MONITOR, SELF-MANAGE AND REDUCE SYMPTOMS OF DIABETES       Current Barriers:  Knowledge Deficits related to Diabetes management Chronic Disease Management support and education needs related to Diabetes, diet No Advanced Directives in place- pt declines information Patient reports CBG is now monitored with [employer], reports fasting CBG is 50-60 almost every morning with CGM alarming, pt states he eats breakfast and CBG increases to  around 100, reports random readings in 100's range and sometimes over 200, pt states he does not eat a bedtime snack and his last meal is dinner at 530-6 pm Patient reports he does not follow a special diet and eats out daily, does not  like a variety of vegetables  Planned Interventions: Reviewed medications with patient and discussed importance of medication adherence;        Counseled on importance of regular laboratory monitoring as prescribed;        Advised patient, providing education and rationale, to check cbg per doctor's order  and record        call provider for findings outside established parameters;       Review of patient status, including review of consultants reports, relevant laboratory and other test results, and medications completed;       Reinforced effects elevated CBG has on the body including kidneys and eyes Reviewed carbohydrate modified diet Encouraged pt to eat a bedtime snack including carbohydrate and protein Reviewed signs/ symptoms hypoglycemia and interventions In basket message to primary care provider and pharmacist reporting hypoglycemic episodes in the morning  Symptom Management: Take medications as prescribed   Attend all scheduled provider appointments Call pharmacy for medication refills 3-7 days in advance of running out of medications Attend church or other social activities Perform all self care activities independently  Perform IADL's (shopping, preparing meals, housekeeping, managing finances) independently Call provider office for new concerns or questions  check blood sugar at prescribed times: per doctor's order  check feet daily for cuts, sores or redness enter blood sugar readings and medication or insulin into daily log take the blood sugar log to all doctor visits take the blood sugar meter to all doctor visits trim toenails straight across drink 6 to 8 glasses of water each day fill half of plate with vegetables limit fast food meals to no more than 1 per week manage portion size prepare main meal at home 3 to 5 days each week read food labels for fat, fiber, carbohydrates and portion size set a realistic goal Be mindful of your carbohydrate intake at each  meal- too much rice, pasta, bread, potatoes, fruit at one meal can elevate your blood sugar Try eating a bedtime snack including carbohydrate and protein Follow RULE OF 15 for low blood sugar management:  How to treat low blood sugars (Blood sugar less than 70 mg/dl  Please follow the RULE OF 15 for the treatment of hypoglycemia treatment (When your blood sugars are less than 70 mg/ dl) STEP  1:  Take 15 grams of carbohydrates when your blood sugar is low, which includes:   3-4 glucose tabs or  3-4 oz of juice or regular soda or  One tube of glucose gel STEP 2:  Recheck blood sugar in 15 minutes STEP 3:  If your blood sugar is still low at the 15 minute recheck ---then, go back to STEP 1 and treat again with another 15 grams of carbohydrates  Follow Up Plan: Telephone follow up appointment with care management team member scheduled for:  12/31/22 at 3 pm          Plan:Telephone follow up appointment with care management team member scheduled for:  12/31/22 at 3 pm  Irving Shows [facility], BSN RN Case Manager [facility] [phone]

## 2022-10-27 ENCOUNTER — Other Ambulatory Visit: Payer: Self-pay | Admitting: Nurse Practitioner

## 2022-10-27 DIAGNOSIS — I10 Essential (primary) hypertension: Secondary | ICD-10-CM

## 2022-11-01 NOTE — Progress Notes (Signed)
Chronic Care Management Pharmacy Note  10/11/2022 Name:  Philip Richardson MRN:  [id number] DOB:  09/06/1955  Summary:   Diabetes:  Uncontrolled-A1C 10.9%; GFR 28; current treatment: Tresiba 80 units (often misses doses), FARXIGA, starting Ozempic patient is enrolled in Novo nordisk PAP for ozempic, tresiba 2024 patient is enrolled in az&me patient assistance program (farxiga) Libre 2 CGM picked up and covered at [employer] Current glucose readings: fasting glucose: up in the low 200s, reports some overnight lows/hypo--however I think some of these are overnight compression lows, post prandial glucose: >200 Denies hypoglycemic/hyperglycemic symptoms Discussed meal planning options and Plate method for healthy eating Avoid sugary drinks and desserts Incorporate balanced protein, non starchy veggies, 1 serving of carbohydrate with each meal Increase water intake Increase physical activity as able Current exercise: n/a Recommended potential GLP1 switch, insulin switch, heart healthy diet/healthy plate method to aid in sugar and lipid control Assessed patient finances. Patient enrolled in novo nordisk PAP & az&me PAP Hypertension--controlled; continue current regimen Hyperlipidemia--statin myopathy, LDL >140, cannot tolerate [id number] Will explore non-statin alternatives-->called in Repatha, but will need to do [facility] (completed all paperwork and grant for repatha, however patient declined taking Repatha or any medications for cholesterol  Lipid Panel     Component Value Date/Time   CHOL 219 (H) 08/13/2022 1440   TRIG 165 (H) 08/13/2022 1440   TRIG 100 04/21/2014 1455   HDL 39 (L) 08/13/2022 1440   HDL 46 04/21/2014 1455   CHOLHDL 5.6 (H) 08/13/2022 1440   LDLCALC 150 (H) 08/13/2022 1440   LDLCALC 103 (H) 01/13/2014 1342   LDLDIRECT 113 (H) 04/08/2018 1622   LABVLDL 30 08/13/2022 1440   Patient Goals/Self-Care Activities patient will:  - take medications as prescribed  as evidenced by patient report and record review check glucose daily, document, and provide at future appointments collaborate with provider on medication access solutions engage in dietary modifications by heart healthy diet/healthy plate method to aid in sugar and lipid control   Subjective: Philip Richardson is an 67 y.o. year old male who is a primary patient of [name], FNP.  The patient was referred to the Chronic Care Management team for assistance with care management needs subsequent to provider initiation of CCM services and plan of care.    Engaged with patient by telephone for follow up visit in response to provider referral for CCM services.   Objective:  LABS:    Lab Results  Component Value Date   CREATININE 2.44 (H) 08/13/2022   CREATININE 2.57 (H) 05/14/2022   CREATININE 2.45 (H) 09/19/2021     Lab Results  Component Value Date   HGBA1C 10.9 (H) 08/13/2022         Component Value Date/Time   CHOL 219 (H) 08/13/2022 1440   TRIG 165 (H) 08/13/2022 1440   TRIG 100 04/21/2014 1455   HDL 39 (L) 08/13/2022 1440   HDL 46 04/21/2014 1455   CHOLHDL 5.6 (H) 08/13/2022 1440   LDLCALC 150 (H) 08/13/2022 1440   LDLCALC 103 (H) 01/13/2014 1342   LDLDIRECT 113 (H) 04/08/2018 1622     Clinical ASCVD: No   The 10-year ASCVD risk score (Arnett DK, et al., 2019) is: 26.8%   Values used to calculate the score:     Age: 67 years     Sex: Male     Is Non-Hispanic African American: No     Diabetic: Yes     Tobacco smoker: No     Systolic Blood Pressure:  110 mmHg     Is BP treated: Yes     HDL Cholesterol: 39 mg/dL     Total Cholesterol: 219 mg/dL    Other: ([id number] if Afib, PHQ9 if depression, MMRC or CAT for COPD, ACT, DEXA)    BP Readings from Last 3 Encounters:  08/13/22 110/60  07/03/22 125/80  05/14/22 130/71      SDOH:  (Social Determinants of Health) assessments and interventions performed:    Allergies  Allergen Reactions    Atorvastatin     Myopathy/weakness   Invokana [Canagliflozin] Other (See Comments)    weakness    Medications Reviewed Today     Reviewed by Audrie Gallus, RN (Registered Nurse) on 67/13/24 at 1429  Med List Status: <None>   Medication Order Taking? Sig Documenting Provider Last Dose Status Informant  acetaminophen (TYLENOL) 325 MG tablet [id number] Yes Take 325-650 mg by mouth every 6 (six) hours as needed (for pain.). [provider] Taking Active Self  amLODipine (NORVASC) 10 MG tablet [id number] Yes Take 1 tablet (10 mg total) by mouth daily. Daphine Deutscher Mary-Margaret, FNP Taking Active   Blood Glucose Monitoring Suppl (ONETOUCH VERIO FLEX SYSTEM) w/Device KIT [id number] Yes Use to test blood sugar twice daily. DX E11.9 [name], FNP Taking Active Self  cloNIDine (CATAPRES) 0.1 MG tablet [id number] Yes TAKE ONE TABLET THREE TIMES DAILY [name], FNP Taking Active   Continuous Blood Gluc Receiver (FREESTYLE LIBRE 2 READER) DEVI [id number] Yes 1 each by Does not apply route daily. Daphine Deutscher Mary-Margaret, FNP Taking Active   Continuous Blood Gluc Sensor (FREESTYLE LIBRE 2 SENSOR) [place] [id number] Yes 1 each by Does not apply route every 14 (fourteen) days. Daphine Deutscher Mary-Margaret, FNP Taking Active   Evolocumab [facility] SURECLICK) 140 MG/ML [place] [id number] Yes Inject 140 mg into the skin every 14 (fourteen) days. Daphine Deutscher, Mary-Margaret, FNP Taking Active   FARXIGA 10 MG TABS tablet [id number] Yes Take 1 tablet (10 mg total) by mouth daily before breakfast. [name], FNP Taking Active   ferrous sulfate 325 (65 FE) MG tablet [id number] Yes Take 1 tablet (325 mg total) by mouth daily with breakfast. Daphine Deutscher, Mary-Margaret, FNP Taking Active   fluticasone (FLONASE) 50 MCG/ACT nasal spray [id number] Yes Place 2 sprays into both nostrils daily. Daphine Deutscher, Mary-Margaret, FNP Taking Active   glimepiride (AMARYL) 4 MG tablet [id number] Yes TAKE (1) TABLET DAILY BEFORE BREAKFAST.  Daphine Deutscher, Mary-Margaret, FNP Taking Active   glucose blood (ONETOUCH VERIO) test strip [id number] Yes Use to test blood sugar twice daily. DX E11.9 [name], FNP Taking Active Self  insulin degludec (TRESIBA FLEXTOUCH) 200 UNIT/ML FlexTouch Pen [id number] Yes Inject 80 Units into the skin daily. Daphine Deutscher Mary-Margaret, FNP Taking Active   Insulin Pen Needle (PEN NEEDLES 31GX5/16") 31G X 8 MM MISC [id number] Yes Use to inject insulin daily as prescribed [name], FNP Taking Active   OneTouch Delica Lancets 30G MISC [id number] Yes Use to test blood sugar twice daily. DX E11.9 [name], FNP Taking Active Self  pantoprazole (PROTONIX) 40 MG tablet [id number] Yes Take 1 tablet (40 mg total) by mouth 2 (two) times daily. [name], FNP Taking Active               Goals Addressed               This Visit's Progress     Patient Stated     T2DM PHARMD GOAL (pt-stated)        Current Barriers:  Unable to independently afford treatment  regimen Unable to achieve control of T2DM, HLD Unable to maintain control of T2DM, HLD  Pharmacist Clinical Goal(s):  patient will verbalize ability to afford treatment regimen achieve control of T2DM as evidenced by GOAL<7% maintain control of T2DM as evidenced by GOAL<7%  through collaboration with PharmD and provider.   Interventions: 1:1 collaboration with [name], FNP regarding development and update of comprehensive plan of care as evidenced by provider attestation and co-signature Inter-disciplinary care team collaboration (see longitudinal plan of care) Comprehensive medication review performed; medication list updated in electronic medical record  Diabetes:  Uncontrolled-A1C 10.9%; GFR 28; current treatment: Tresiba 80 units (often misses doses), FARXIGA, starting Ozempic patient is enrolled in Novo nordisk PAP for ozempic, tresiba 2024 patient is enrolled in az&me patient assistance  program (farxiga) Libre 2 CGM picked up and covered at [employer] Current glucose readings: fasting glucose: up in the low 200s, reports some overnight lows/hypo--however I think some of these are overnight compression lows, post prandial glucose: >200 Denies hypoglycemic/hyperglycemic symptoms Discussed meal planning options and Plate method for healthy eating Avoid sugary drinks and desserts Incorporate balanced protein, non starchy veggies, 1 serving of carbohydrate with each meal Increase water intake Increase physical activity as able Current exercise: n/a Recommended potential GLP1 switch, insulin switch, heart healthy diet/healthy plate method to aid in sugar and lipid control Assessed patient finances. Patient enrolled in novo nordisk PAP & az&me PAP Hypertension--controlled; continue current regimen Hyperlipidemia--statin myopathy, LDL >140, cannot tolerate [id number] Will explore non-statin alternatives-->called in Repatha, but will need to do [facility] (completed all paperwork and grant for repatha, however patient declined taking Repatha or any medications for cholesterol  Lipid Panel     Component Value Date/Time   CHOL 219 (H) 08/13/2022 1440   TRIG 165 (H) 08/13/2022 1440   TRIG 100 04/21/2014 1455   HDL 39 (L) 08/13/2022 1440   HDL 46 04/21/2014 1455   CHOLHDL 5.6 (H) 08/13/2022 1440   LDLCALC 150 (H) 08/13/2022 1440   LDLCALC 103 (H) 01/13/2014 1342   LDLDIRECT 113 (H) 04/08/2018 1622   LABVLDL 30 08/13/2022 1440  Patient Goals/Self-Care Activities patient will:  - take medications as prescribed as evidenced by patient report and record review check glucose daily, document, and provide at future appointments collaborate with provider on medication access solutions engage in dietary modifications by heart healthy diet/healthy plate method to aid in sugar and lipid control         Plan: Telephone follow up appointment with care management team member  scheduled for:  1 month     Kieth Brightly, PharmD, BCACP Clinical Pharmacist, [facility]

## 2022-11-02 DIAGNOSIS — E1122 Type 2 diabetes mellitus with diabetic chronic kidney disease: Secondary | ICD-10-CM | POA: Diagnosis not present

## 2022-11-02 DIAGNOSIS — Z794 Long term (current) use of insulin: Secondary | ICD-10-CM | POA: Diagnosis not present

## 2022-11-02 DIAGNOSIS — N185 Chronic kidney disease, stage 5: Secondary | ICD-10-CM

## 2022-11-05 DIAGNOSIS — E1142 Type 2 diabetes mellitus with diabetic polyneuropathy: Secondary | ICD-10-CM | POA: Diagnosis not present

## 2022-11-08 ENCOUNTER — Ambulatory Visit (INDEPENDENT_AMBULATORY_CARE_PROVIDER_SITE_OTHER): Payer: Medicare HMO | Admitting: Nurse Practitioner

## 2022-11-08 ENCOUNTER — Encounter: Payer: Self-pay | Admitting: Nurse Practitioner

## 2022-11-08 VITALS — BP 140/67 | HR 57 | Temp 97.9°F | Resp 20 | Ht 73.0 in | Wt 198.0 lb

## 2022-11-08 DIAGNOSIS — E1142 Type 2 diabetes mellitus with diabetic polyneuropathy: Secondary | ICD-10-CM

## 2022-11-08 DIAGNOSIS — K219 Gastro-esophageal reflux disease without esophagitis: Secondary | ICD-10-CM | POA: Diagnosis not present

## 2022-11-08 DIAGNOSIS — D649 Anemia, unspecified: Secondary | ICD-10-CM | POA: Diagnosis not present

## 2022-11-08 DIAGNOSIS — Z794 Long term (current) use of insulin: Secondary | ICD-10-CM

## 2022-11-08 DIAGNOSIS — E785 Hyperlipidemia, unspecified: Secondary | ICD-10-CM

## 2022-11-08 DIAGNOSIS — I120 Hypertensive chronic kidney disease with stage 5 chronic kidney disease or end stage renal disease: Secondary | ICD-10-CM

## 2022-11-08 DIAGNOSIS — E875 Hyperkalemia: Secondary | ICD-10-CM | POA: Diagnosis not present

## 2022-11-08 DIAGNOSIS — N185 Chronic kidney disease, stage 5: Secondary | ICD-10-CM

## 2022-11-08 DIAGNOSIS — I10 Essential (primary) hypertension: Secondary | ICD-10-CM | POA: Diagnosis not present

## 2022-11-08 DIAGNOSIS — R7989 Other specified abnormal findings of blood chemistry: Secondary | ICD-10-CM | POA: Diagnosis not present

## 2022-11-08 LAB — BAYER DCA HB A1C WAIVED: HB A1C (BAYER DCA - WAIVED): 10 % — ABNORMAL HIGH (ref 4.8–5.6)

## 2022-11-08 MED ORDER — RYBELSUS 3 MG PO TABS
3.0000 mg | ORAL_TABLET | Freq: Every day | ORAL | 0 refills | Status: DC
Start: 2022-11-08 — End: 2023-01-28

## 2022-11-08 MED ORDER — TRESIBA FLEXTOUCH 200 UNIT/ML ~~LOC~~ SOPN
86.0000 [IU] | PEN_INJECTOR | Freq: Every day | SUBCUTANEOUS | 5 refills | Status: DC
Start: 2022-11-08 — End: 2023-01-28

## 2022-11-08 MED ORDER — TRESIBA FLEXTOUCH 200 UNIT/ML ~~LOC~~ SOPN: 86.0000 [IU] | PEN_INJECTOR | Freq: Every day | SUBCUTANEOUS | 5 refills | Status: DC

## 2022-11-08 NOTE — Progress Notes (Signed)
Subjective:    Patient ID: Philip Richardson, male    DOB: [birth year], 67 y.o.   MRN: [id number]   Chief Complaint: medical management of chronic issues     HPI:  Philip Richardson is a 67 y.o. who identifies as a male who was assigned male at birth.   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 Primary hypertension No c/o chest  pain, sob or headache. Does not check blod pressure at home. BP Readings from Last 3 Encounters:  08/13/22 110/60  07/03/22 125/80  05/14/22 130/71     2. Hyperlipidemia with target LDL less than 100 Doe snoit wtahc diet and does no exercise. Refuses treatment Lab Results  Component Value Date   CHOL 219 (H) 08/13/2022   HDL 39 (L) 08/13/2022   LDLCALC 150 (H) 08/13/2022   LDLDIRECT 113 (H) 04/08/2018   TRIG 165 (H) 08/13/2022   CHOLHDL 5.6 (H) 08/13/2022     3. Type 2 diabetes mellitus with diabetic polyneuropathy, with long-term current use of insulin (HCC) Fasting blood sugars are running around 80-250. Patient saw clinical pharmacist on 10/11/22. She started him on ozempic weekly along with his current meds. Lab Results  Component Value Date   HGBA1C 10.9 (H) 08/13/2022     4. Gastroesophageal reflux disease, unspecified whether esophagitis present Takes protonix daily to keep symptoms under control.  5. Hyperkalemia No c/o of muscle cramping Lab Results  Component Value Date   K 4.3 08/13/2022     6. Elevated LFTs Lab Results  Component Value Date   ALT 19 08/13/2022   AST 11 08/13/2022   ALKPHOS 108 08/13/2022   BILITOT 0.3 08/13/2022     7. Normocytic anemia Lab Results  Component Value Date   HGB 13.3 08/13/2022     8. CKD (chronic kidney disease) stage 5, GFR less than 15 ml/min [facility]) Sees nephrology Lab Results  Component Value Date   CREATININE 2.44 (H) 08/13/2022      New complaints: None  today  Allergies  Allergen Reactions   Atorvastatin     Myopathy/weakness   Invokana [Canagliflozin] Other (See Comments)    weakness   Outpatient Encounter Medications as of 11/08/2022  Medication Sig   acetaminophen (TYLENOL) 325 MG tablet Take 325-650 mg by mouth every 6 (six) hours as needed (for pain.).   amLODipine (NORVASC) 10 MG tablet Take 1 tablet (10 mg total) by mouth daily.   Blood Glucose Monitoring Suppl (ONETOUCH VERIO FLEX SYSTEM) w/Device KIT Use to test blood sugar twice daily. DX E11.9   cloNIDine (CATAPRES) 0.1 MG tablet TAKE ONE TABLET THREE TIMES DAILY   Continuous Blood Gluc Receiver (FREESTYLE LIBRE 2 READER) DEVI 1 each by Does not apply route daily.   Continuous Blood Gluc Sensor (FREESTYLE LIBRE 2 SENSOR) MISC 1 each by Does not apply route every 14 (fourteen) days.   FARXIGA 10 MG TABS tablet Take 1 tablet (10 mg total) by mouth daily before breakfast.   ferrous sulfate 325 (65 FE) MG tablet Take 1 tablet (325 mg total) by mouth daily with breakfast.   fluticasone (FLONASE) 50 MCG/ACT nasal spray Place 2 sprays into both nostrils daily.   glimepiride (AMARYL) 4 MG tablet TAKE (1) TABLET DAILY BEFORE BREAKFAST.   glucose blood (ONETOUCH VERIO) test strip Use to test blood sugar twice daily. DX E11.9   insulin degludec (TRESIBA  FLEXTOUCH) 200 UNIT/ML FlexTouch Pen Inject 80 Units into the skin daily.   Insulin Pen Needle (PEN NEEDLES 31GX5/16") 31G X 8 MM MISC Use to inject insulin daily as prescribed   OneTouch Delica Lancets 30G MISC Use to test blood sugar twice daily. DX E11.9   pantoprazole (PROTONIX) 40 MG tablet Take 1 tablet (40 mg total) by mouth 2 (two) times daily.   No facility-administered encounter medications on file as of 11/08/2022.    Past Surgical History:  Procedure Laterality Date   BIOPSY  04/25/2020   Procedure: BIOPSY;  Surgeon: Lanelle Bal, DO;  Location: [facility];  Service: Endoscopy;;  duodenum gastric esophagus    COLONOSCOPY WITH PROPOFOL N/A 04/25/2020   internal hemorrhoids, one 5 mm polyp in descending colon. Tubular adenoma. 5 year surveillance.   CYSTOSCOPY W/ URETERAL STENT PLACEMENT Bilateral 12/09/2013   Procedure: CYSTOSCOPY WITH RETROGRADE PYELOGRAM/URETERAL STENT PLACEMENT;  Surgeon: Sebastian Ache, MD;  Location: [facility];  Service: Urology;  Laterality: Bilateral;   CYSTOSCOPY WITH RETROGRADE PYELOGRAM, URETEROSCOPY AND STENT PLACEMENT Bilateral 09/30/2013   Procedure: CYSTOSCOPY WITH BILATERAL RETROGRADE PYELOGRAM, LEFT DIAGNOSTIC URETEROSCOPY AND Left ureteral stent;  Surgeon: Sebastian Ache, MD;  Location: [facility];  Service: Urology;  Laterality: Bilateral;   ESOPHAGOGASTRODUODENOSCOPY (EGD) WITH PROPOFOL N/A 04/25/2020   Mildly severe candida esophagitis without bleed, s/p biopsy. Suspicion for eosinophilic esophagitis but no increased eosinophils. Gastritis. Reactive gastropathy. Negative H.pylori.  +KOH prep.    PERCUTANEOUS NEPHROLITHOTRIPSY  2005   POLYPECTOMY  04/25/2020   Procedure: POLYPECTOMY;  Surgeon: Lanelle Bal, DO;  Location: [facility];  Service: Endoscopy;;  colon   ROBOT ASSISTED PYELOPLASTY N/A 12/09/2013   Procedure: ROBOTIC ASSISTED BILATERAL PYELOLITHOTOMY, RIGHT  PYELOPLASTY ;  Surgeon: Sebastian Ache, MD;  Location: [facility];  Service: Urology;  Laterality: N/A;    Family History  Problem Relation Age of Onset   Cancer Mother    Colon cancer Neg Hx    Pancreatitis Neg Hx       Controlled substance contract: n/a     Review of Systems  Constitutional:  Negative for diaphoresis.  Eyes:  Negative for pain.  Respiratory:  Negative for shortness of breath.   Cardiovascular:  Negative for chest pain, palpitations and leg swelling.  Gastrointestinal:  Negative for abdominal pain.  Endocrine: Negative for polydipsia.  Skin:  Negative for rash.  Neurological:  Negative for dizziness, weakness and headaches.  Hematological:  Does not  bruise/bleed easily.  All other systems reviewed and are negative.      Objective:   Physical Exam Vitals and nursing note reviewed.  Constitutional:      Appearance: Normal appearance. He is well-developed.  HENT:     Head: Normocephalic.     Nose: Nose normal.     Mouth/Throat:     Mouth: Mucous membranes are moist.     Pharynx: Oropharynx is clear.  Eyes:     Pupils: Pupils are equal, round, and reactive to light.  Neck:     Thyroid: No thyroid mass or thyromegaly.     Vascular: No carotid bruit or JVD.     Trachea: Phonation normal.  Cardiovascular:     Rate and Rhythm: Normal rate and regular rhythm.  Pulmonary:     Effort: Pulmonary effort is normal. No respiratory distress.     Breath sounds: Normal breath sounds.  Abdominal:     General: Bowel sounds are normal.     Palpations: Abdomen is soft.  Tenderness: There is no abdominal tenderness.  Musculoskeletal:        General: Normal range of motion.     Cervical back: Normal range of motion and neck supple.  Lymphadenopathy:     Cervical: No cervical adenopathy.  Skin:    General: Skin is warm and dry.  Neurological:     Mental Status: He is alert and oriented to person, place, and time.  Psychiatric:        Behavior: Behavior normal.        Thought Content: Thought content normal.        Judgment: Judgment normal.    BP (!) 140/67   Pulse (!) 57   Temp 97.9 F (36.6 C) (Temporal)   Resp 20   Ht 6\' 1"  (1.854 m)   Wt 198 lb (89.8 kg)   SpO2 99%   BMI 26.12 kg/m         Assessment & Plan:   Traci Stirling comes in today with chief complaint of Medical Management of Chronic Issues   Diagnosis and orders addressed:  1. Primary hypertension Low sodium diet - CBC with Differential/Platelet - CMP14+EGFR  2. Hyperlipidemia with target LDL less than 100 Low fat diet - Lipid panel  3. Type 2 diabetes mellitus with diabetic polyneuropathy, with long-term current use of insulin (HCC) Strict  carb counting Increase tresiba to 86u daily Rybellsus samples 3mg  daily Bring glucose reader to office - Bayer DCA Hb A1c Waived - insulin degludec (TRESIBA FLEXTOUCH) 200 UNIT/ML FlexTouch Pen; Inject 86 Units into the skin daily.  Dispense: 15 mL; Refill: 5 - Semaglutide (RYBELSUS) 3 MG TABS; Take 1 tablet (3 mg total) by mouth daily.  Dispense: 30 tablet; Refill: 0  4. Gastroesophageal reflux disease, unspecified whether esophagitis present Avoid spicy foods Do not eat 2 hours prior to bedtime   5. Hyperkalemia Labs pending  6. Elevated LFTs Labs pending  7. Normocytic anemia Labs pending  8. CKD (chronic kidney disease) stage 5, GFR less than 15 ml/min (HCC) Keep follow up with nephrology   Labs pending Health Maintenance reviewed Diet and exercise encouraged  Follow up plan: 4 weeks    Mary-Margaret Daphine Deutscher, FNP

## 2022-11-08 NOTE — Patient Instructions (Signed)

## 2022-11-09 LAB — CMP14+EGFR
ALT: 23 IU/L (ref 0–44)
AST: 14 IU/L (ref 0–40)
Albumin/Globulin Ratio: 1.7 (ref 1.2–2.2)
Albumin: 4 g/dL (ref 3.9–4.9)
Alkaline Phosphatase: 105 IU/L (ref 44–121)
BUN/Creatinine Ratio: 14 (ref 10–24)
BUN: 30 mg/dL — ABNORMAL HIGH (ref 8–27)
Bilirubin Total: 0.3 mg/dL (ref 0.0–1.2)
CO2: 25 mmol/L (ref 20–29)
Calcium: 9.4 mg/dL (ref 8.6–10.2)
Chloride: 98 mmol/L (ref 96–106)
Creatinine, Ser: 2.21 mg/dL — ABNORMAL HIGH (ref 0.76–1.27)
Globulin, Total: 2.3 g/dL (ref 1.5–4.5)
Glucose: 360 mg/dL — ABNORMAL HIGH (ref 70–99)
Potassium: 4.8 mmol/L (ref 3.5–5.2)
Sodium: 135 mmol/L (ref 134–144)
Total Protein: 6.3 g/dL (ref 6.0–8.5)
eGFR: 32 mL/min/{1.73_m2} — ABNORMAL LOW (ref >59)

## 2022-11-09 LAB — LIPID PANEL
Chol/HDL Ratio: 5.9 ratio — ABNORMAL HIGH (ref 0.0–5.0)
Cholesterol, Total: 217 mg/dL — ABNORMAL HIGH (ref 100–199)
HDL: 37 mg/dL — ABNORMAL LOW (ref >39)
LDL Chol Calc (NIH): 134 mg/dL — ABNORMAL HIGH (ref 0–99)
Triglycerides: 255 mg/dL — ABNORMAL HIGH (ref 0–149)
VLDL Cholesterol Cal: 46 mg/dL — ABNORMAL HIGH (ref 5–40)

## 2022-11-09 LAB — CBC WITH DIFFERENTIAL/PLATELET
Basophils Absolute: 0.1 10*3/uL (ref 0.0–0.2)
Basos: 1 %
EOS (ABSOLUTE): 0.1 10*3/uL (ref 0.0–0.4)
Eos: 1 %
Hematocrit: 40.8 % (ref 37.5–51.0)
Hemoglobin: 13.4 g/dL (ref 13.0–17.7)
Immature Grans (Abs): 0 10*3/uL (ref 0.0–0.1)
Immature Granulocytes: 0 %
Lymphocytes Absolute: 1.5 10*3/uL (ref 0.7–3.1)
Lymphs: 27 %
MCH: 26.9 pg (ref 26.6–33.0)
MCHC: 32.8 g/dL (ref 31.5–35.7)
MCV: 82 fL (ref 79–97)
Monocytes Absolute: 0.4 10*3/uL (ref 0.1–0.9)
Monocytes: 7 %
Neutrophils Absolute: 3.5 10*3/uL (ref 1.4–7.0)
Neutrophils: 64 %
Platelets: 195 10*3/uL (ref 150–450)
RBC: 4.98 x10E6/uL (ref 4.14–5.80)
RDW: 13.6 % (ref 11.6–15.4)
WBC: 5.5 10*3/uL (ref 3.4–10.8)

## 2022-11-09 NOTE — Progress Notes (Signed)
R/c pt

## 2022-12-02 DIAGNOSIS — R030 Elevated blood-pressure reading, without diagnosis of hypertension: Secondary | ICD-10-CM | POA: Diagnosis not present

## 2022-12-02 DIAGNOSIS — L03012 Cellulitis of left finger: Secondary | ICD-10-CM | POA: Diagnosis not present

## 2022-12-02 DIAGNOSIS — E663 Overweight: Secondary | ICD-10-CM | POA: Diagnosis not present

## 2022-12-02 DIAGNOSIS — Z6826 Body mass index (BMI) 26.0-26.9, adult: Secondary | ICD-10-CM | POA: Diagnosis not present

## 2022-12-03 ENCOUNTER — Encounter: Payer: Self-pay | Admitting: Nurse Practitioner

## 2022-12-03 ENCOUNTER — Ambulatory Visit: Payer: Medicare HMO | Admitting: Nurse Practitioner

## 2022-12-03 NOTE — Progress Notes (Deleted)
   Subjective:    Patient ID: Philip Richardson, male    DOB: [birth year], 67 y.o.   MRN: [id number]   Chief Complaint: diabetes  HPI  Patient is being followed up on his Diabetes. He is very noncompliant with his diet and exercise. We put a libre on him at last visit and he is coming in today to review his blood sugars with him. We increased his tresiba dose to 86u at last visit and added rybellsus back to medications.  Lab Results  Component Value Date   HGBA1C 10.0 (H) 11/08/2022      Patient Active Problem List   Diagnosis Date Noted   CKD (chronic kidney disease) stage 5, GFR less than 15 ml/min (HCC) 06/05/2022   Drug-induced myopathy 03/14/2020   Normocytic anemia 10/09/2019   Elevated LFTs    GERD (gastroesophageal reflux disease) 07/16/2019   Hyperkalemia 07/16/2019   Constipation 07/16/2019   Hyperlipidemia with target LDL less than 100 02/01/2015   Staghorn kidney stones 12/09/2013   Hypertension 10/13/2013   Diabetes (HCC) 09/10/2013       Review of Systems     Objective:   Physical Exam        Assessment & Plan:

## 2022-12-11 ENCOUNTER — Telehealth: Payer: Self-pay | Admitting: Nurse Practitioner

## 2022-12-11 NOTE — Telephone Encounter (Signed)
Moved patient appt up to this Friday per patients request.

## 2022-12-14 ENCOUNTER — Encounter: Payer: Self-pay | Admitting: Nurse Practitioner

## 2022-12-14 ENCOUNTER — Ambulatory Visit (INDEPENDENT_AMBULATORY_CARE_PROVIDER_SITE_OTHER): Payer: Medicare HMO | Admitting: Nurse Practitioner

## 2022-12-14 VITALS — BP 156/87 | HR 84 | Temp 97.8°F | Resp 20 | Ht 73.0 in | Wt 193.0 lb

## 2022-12-14 DIAGNOSIS — Z794 Long term (current) use of insulin: Secondary | ICD-10-CM

## 2022-12-14 DIAGNOSIS — E1142 Type 2 diabetes mellitus with diabetic polyneuropathy: Secondary | ICD-10-CM

## 2022-12-14 DIAGNOSIS — L03011 Cellulitis of right finger: Secondary | ICD-10-CM

## 2022-12-14 LAB — BAYER DCA HB A1C WAIVED: HB A1C (BAYER DCA - WAIVED): 9.9 % — ABNORMAL HIGH (ref 4.8–5.6)

## 2022-12-14 MED ORDER — CEFTRIAXONE SODIUM 1 G IJ SOLR
1.0000 g | Freq: Once | INTRAMUSCULAR | Status: AC
Start: 2022-12-14 — End: 2022-12-14
  Administered 2022-12-14: 1 g via INTRAMUSCULAR

## 2022-12-14 MED ORDER — DOXYCYCLINE HYCLATE 100 MG PO TABS
100.0000 mg | ORAL_TABLET | Freq: Two times a day (BID) | ORAL | 0 refills | Status: DC
Start: 2022-12-14 — End: 2023-01-28

## 2022-12-14 MED ORDER — FREESTYLE LIBRE 2 SENSOR MISC
1.0000 | 5 refills | Status: DC
Start: 2022-12-14 — End: 2023-01-30

## 2022-12-14 NOTE — Patient Instructions (Signed)
Cellulitis, Adult  Cellulitis is a skin infection. The infected area is often warm, red, swollen, and sore. It occurs most often on the legs, feet, and toes, but can happen on any part of the body. This condition can be life-threatening without treatment. It is very important to get treated right away. What are the causes? This condition is caused by bacteria. The bacteria enter through a break in the skin, such as: A cut. A burn. A bug bite. An animal bite. An open sore. A crack. What increases the risk? Having a weak body's defense system (immune system). Being older than 67 years old. Having a blood sugar problem (diabetes). Having a long-term liver disease (cirrhosis) or kidney disease. Being very overweight (obese). Having a skin problem, such as: An itchy rash. A rash caused by a fungus. A rash with blisters. Slow movement of blood in the veins (venous stasis). Fluid buildup below the skin (edema). This condition is more likely to occur in people who: Have open cuts, burns, bites, or scrapes on the skin. Have been treated with high-energy rays (radiation). Use IV drugs. What are the signs or symptoms? Skin that: Looks red or purple, or slightly darker than your usual skin color. Has streaks. Has spots. Is swollen. Is sore or painful when you touch it. Is warm. A fever. Chills. Blisters. Tiredness (fatigue). How is this treated? Medicines to treat infections or allergies. Rest. Placing cold or warm cloths on the skin. Staying in the hospital, if the condition is very bad. You may need medicines through an IV. Follow these instructions at home: Medicines Take over-the-counter and prescription medicines only as told by your doctor. If you were prescribed antibiotics, take them as told by your doctor. Do not stop using them even if you start to feel better. General instructions Drink enough fluid to keep your pee (urine) pale yellow. Do not touch or rub the  infected area. Raise (elevate) the infected area above the level of your heart while you are sitting or lying down. Return to your normal activities when your doctor says that it is safe. Place cold or warm cloths on the area as told by your doctor. Keep all follow-up visits. Your doctor will need to make sure that a more serious infection is not developing. Contact a doctor if: You have a fever. You do not start to get better after 1-2 days of treatment. Your bone or joint under the infected area starts to hurt after the skin has healed. Your infection comes back in the same area or another area. Signs of this may include: You have a swollen bump in the area. Your red area gets larger, turns dark in color, or hurts more. You have more fluid coming from the wound. Pus or a bad smell develops in your infected area. You have more pain. You feel sick and have muscle aches and weakness. You develop vomiting or watery poop that will not go away. Get help right away if: You see red streaks coming from the area. You notice the skin turns purple or black and falls off. These symptoms may be an emergency. Get help right away. Call 911. Do not wait to see if the symptoms will go away. Do not drive yourself to the hospital. This information is not intended to replace advice given to you by your health care provider. Make sure you discuss any questions you have with your health care provider. Document Revised: 01/16/2022 Document Reviewed: 01/16/2022 Elsevier Patient Education    2024 [employer]  

## 2022-12-14 NOTE — Progress Notes (Signed)
Subjective:    Patient ID: Philip Richardson, male    DOB: 05-29-1956, 67 y.o.   MRN: [id number]   Chief Complaint: diabetes  HPI  Patient I today for follow up of diabetes. He is very noncompliant with diet and exrcise. The clinical pharmacist and I have been working really hard to get his HGBA1c down and diabetes under control. He has refused medication additions in  the past due to what he has heard or read about them. We placed a CGM on him at last visit, and was asked to bring in blood sugar diary. We increase tresiba to 86u daily and added rybelisus.   Got bit by something on right middle finger- swollen red and can't bend. He went to ED and was put on antibiotics which he has completed. Not much better. Patient Active Problem List   Diagnosis Date Noted   CKD (chronic kidney disease) stage 5, GFR less than 15 ml/min (HCC) 06/05/2022   Drug-induced myopathy 03/14/2020   Normocytic anemia 10/09/2019   Elevated LFTs    GERD (gastroesophageal reflux disease) 07/16/2019   Hyperkalemia 07/16/2019   Constipation 07/16/2019   Hyperlipidemia with target LDL less than 100 02/01/2015   Staghorn kidney stones 12/09/2013   Hypertension 10/13/2013   Diabetes (HCC) 09/10/2013       Review of Systems  Constitutional:  Negative for diaphoresis.  Eyes:  Negative for pain.  Respiratory:  Negative for shortness of breath.   Cardiovascular:  Negative for chest pain, palpitations and leg swelling.  Gastrointestinal:  Negative for abdominal pain.  Endocrine: Negative for polydipsia.  Musculoskeletal:  Negative for arthralgias.  Skin:  Negative for rash.  Neurological:  Negative for dizziness, weakness and headaches.  Hematological:  Does not bruise/bleed easily.  All other systems reviewed and are negative.      Objective:   Physical Exam Constitutional:      Appearance: Normal appearance.  Cardiovascular:     Rate and Rhythm: Normal rate and regular rhythm.     Heart sounds: Normal  heart sounds.  Pulmonary:     Effort: Pulmonary effort is normal.     Breath sounds: Normal breath sounds.  Skin:    Comments: Right middle finger erythematous and swollen ( se picture )  Neurological:     General: No focal deficit present.     Mental Status: He is alert and oriented to person, place, and time.  Psychiatric:        Mood and Affect: Mood normal.        Behavior: Behavior normal.    BP (!) 156/87   Pulse 84   Temp 97.8 F (36.6 C) (Temporal)   Resp 20   Ht 6\' 1"  (1.854 m)   Wt 193 lb (87.5 kg)   SpO2 97%   BMI 25.46 kg/m         Assessment & Plan:  Philip Richardson in today with chief complaint of Diabetes   1. Type 2 diabetes mellitus with diabetic polyneuropathy, with long-term current use of insulin (HCC) Another sample of rybelisus give Follow up in 1 month - Bayer DCA Hb A1c Waived  2. Cellulitis of right middle finger Epsom salt soaks RTO if not improving - doxycycline (VIBRA-TABS) 100 MG tablet; Take 1 tablet (100 mg total) by mouth 2 (two) times daily. 1 po bid  Dispense: 20 tablet; Refill: 0 - cefTRIAXone (ROCEPHIN) injection 1 g    The above assessment and management plan was discussed with the patient. The  patient verbalized understanding of and has agreed to the management plan. Patient is aware to call the clinic if symptoms persist or worsen. Patient is aware when to return to the clinic for a follow-up visit. Patient educated on when it is appropriate to go to the emergency department.   Mary-Margaret Daphine Deutscher, FNP

## 2022-12-20 ENCOUNTER — Ambulatory Visit: Payer: Medicare HMO | Admitting: Nurse Practitioner

## 2022-12-31 ENCOUNTER — Ambulatory Visit (INDEPENDENT_AMBULATORY_CARE_PROVIDER_SITE_OTHER): Payer: Medicare HMO | Admitting: *Deleted

## 2022-12-31 DIAGNOSIS — E1142 Type 2 diabetes mellitus with diabetic polyneuropathy: Secondary | ICD-10-CM

## 2022-12-31 DIAGNOSIS — N185 Chronic kidney disease, stage 5: Secondary | ICD-10-CM

## 2022-12-31 DIAGNOSIS — Z794 Long term (current) use of insulin: Secondary | ICD-10-CM

## 2022-12-31 NOTE — Patient Instructions (Signed)
Please call the care guide team at [phone] if you need to cancel or reschedule your appointment.   If you are experiencing a Mental Health or Behavioral Health Crisis or need someone to talk to, please call the Suicide and Crisis Lifeline: 988 call the [place] National Suicide Prevention Lifeline: 541-770-7339 or TTY: (754)736-2904 TTY 7822399746) to talk to a trained counselor call 1-800-273-TALK (toll free, 24 hour hotline) go to [facility] [address], [place] [phone]) call the [facility]: [phone] call 911   Following is a copy of the CCM Program Consent:  CCM service includes personalized support from designated clinical staff supervised by the physician, including individualized plan of care and coordination with other care providers 24/7 contact phone numbers for assistance for urgent and routine care needs. Service will only be billed when office clinical staff spend 20 minutes or more in a month to coordinate care. Only one practitioner may furnish and bill the service in a calendar month. The patient may stop CCM services at amy time (effective at the end of the month) by phone call to the office staff. The patient will be responsible for cost sharing (co-pay) or up to 20% of the service fee (after annual deductible is met)  Following is a copy of your full provider care plan:   Goals Addressed             This Visit's Progress    CCM (CHRONIC KIDNEY DISEASE) EXPECTED OUTCOME: MONITOR, SELF-MANAGE AND REDUCE SYMPTOMS OF CHRONIC KIDNEY DISEASE       Current Barriers:  Knowledge Deficits related to Chronic Kidney Disease management Chronic Disease Management support and education needs related to CKD, diet No Advanced Directives in place- pt declines information Patient reports he lives alone, has girlfriend and sister he can call on if needed Patient reports he is independent with all aspects of his  care Patient reports he does not follow a special diet, pt does exercise- walks 30 minutes daily Patient reports he recently followed up with nephrologist and "nothing was any worse", no new concerns reported Patient recently had cellulitis to right middle finger, saw primary care provider, has now finished doxycycline, states still slightly swollen, no redness, no drainage, no signs or symptoms of infection, verbalizes understanding to contact primary care provider if worsens  Planned Interventions: Evaluation of current treatment plan related to chronic kidney disease self management and patient's adherence to plan as established by provider      Reviewed medications with patient and discussed importance of compliance    Counseled on the importance of exercise goals with target of 150 minutes per week     Advised patient, providing education and rationale, to monitor blood pressure daily and record, calling PCP for findings outside established parameters    Discussed complications of poorly controlled blood pressure such as heart disease, stroke, circulatory complications, vision complications, kidney impairment, sexual dysfunction    Advised patient to discuss any changes in health status, issues with medications with provider    Engage patient in early, proactive and ongoing discussion about goals of care and what matters most to them    Reviewed upcoming scheduled appointments including primary care provider in August Reinforced instructions from nephrologist- Be careful with potassium intake, drink 2-2.5 liters of water to help prevent kidney stones, limit sodium to less than 2.3 grams per day, increase fruit and vegetable intake Reviewed signs/ symptoms of infection and importance of contacting primary care provider for  worsening symptoms of cellulitis  Symptom Management: Take medications as prescribed   Attend all scheduled provider appointments Call pharmacy for medication refills 3-7  days in advance of running out of medications Attend church or other social activities Perform all self care activities independently  Perform IADL's (shopping, preparing meals, housekeeping, managing finances) independently Call provider office for new concerns or questions  Follow low sodium diet Read food labels for sodium content and limit intake to less than 2.3 grams per day Drink 2- 2.5 liters of water Be careful with potassium intake Increase fruit and vegetable intake Please contact your primary care provider if right middle finger looks worse or infection (redness, drainage, increased swelling, fever)  Follow Up Plan: Telephone follow up appointment with care management team member scheduled for:  02/28/23 at 130 pm       CCM (DIABETES) EXPECTED OUTCOME:  MONITOR, SELF-MANAGE AND REDUCE SYMPTOMS OF DIABETES       Current Barriers:  Knowledge Deficits related to Diabetes management Chronic Disease Management support and education needs related to Diabetes, diet No Advanced Directives in place- pt declines information Patient reports CBG is now monitored with [facility], reports no further hypoglycemic episodes, fasting CBG is in the 90's range with today's reading 84, pt states he does not eat a bedtime snack and his last meal is dinner at 530-6 pm Patient reports he does not follow a special diet and eats out daily, does not like a variety of vegetables Patient reports he is not taking Rybelsus, could not tolerate and plans to discuss with primary care provider at next appointment in August  Planned Interventions: Reviewed medications with patient and discussed importance of medication adherence;        Counseled on importance of regular laboratory monitoring as prescribed;        Advised patient, providing education and rationale, to check cbg per doctor's order  and record        call provider for findings outside established parameters;       Review of patient status,  including review of consultants reports, relevant laboratory and other test results, and medications completed;       Reinforced effects elevated CBG has on the body including kidneys and eyes Reinforced carbohydrate modified diet Encouraged pt to eat a bedtime snack including carbohydrate and protein Reinforced signs/ symptoms hypoglycemia and interventions  Symptom Management: Take medications as prescribed   Attend all scheduled provider appointments Call pharmacy for medication refills 3-7 days in advance of running out of medications Attend church or other social activities Perform all self care activities independently  Perform IADL's (shopping, preparing meals, housekeeping, managing finances) independently Call provider office for new concerns or questions  check blood sugar at prescribed times: per doctor's order  check feet daily for cuts, sores or redness enter blood sugar readings and medication or insulin into daily log take the blood sugar log to all doctor visits take the blood sugar meter to all doctor visits trim toenails straight across drink 6 to 8 glasses of water each day fill half of plate with vegetables limit fast food meals to no more than 1 per week manage portion size prepare main meal at home 3 to 5 days each week read food labels for fat, fiber, carbohydrates and portion size set a realistic goal Be mindful of your carbohydrate intake at each meal- too much rice, pasta, bread, potatoes, fruit at one meal can elevate your blood sugar Try eating a bedtime snack including  carbohydrate and protein Follow RULE OF 15 for low blood sugar management:  How to treat low blood sugars (Blood sugar less than 70 mg/dl  Please follow the RULE OF 15 for the treatment of hypoglycemia treatment (When your blood sugars are less than 70 mg/ dl) STEP  1:  Take 15 grams of carbohydrates when your blood sugar is low, which includes:   3-4 glucose tabs or  3-4 oz of juice or  regular soda or  One tube of glucose gel STEP 2:  Recheck blood sugar in 15 minutes STEP 3:  If your blood sugar is still low at the 15 minute recheck ---then, go back to STEP 1 and treat again with another 15 grams of carbohydrates  Follow Up Plan: Telephone follow up appointment with care management team member scheduled for:  02/28/23 at 130 pm          Patient verbalizes understanding of instructions and care plan provided today and agrees to view in MyChart. Active MyChart status and patient understanding of how to access instructions and care plan via MyChart confirmed with patient.  Telephone follow up appointment with care management team member scheduled for:  02/28/23 at 130 pm  Cellulitis, Adult  Cellulitis is a skin infection. The infected area is usually warm, red, swollen, and tender. It most commonly occurs on the lower body, such as the legs, feet, and toes, but this condition can occur on any part of the body. The infection can travel to the muscles, blood, and underlying tissue and become life-threatening without treatment. It is important to get medical treatment right away for this condition. What are the causes? Cellulitis is caused by bacteria. The bacteria enter through a break in the skin, such as a cut, burn, insect or animal bite, open sore, or crack. What increases the risk? This condition is more likely to occur in people who: Have a weak body's defense system (immune system). Are older than 67 years old. Have diabetes. Have a type of long-term (chronic) liver disease (cirrhosis) or kidney disease. Are obese. Have a skin condition such as: An itchy rash, such as eczema or psoriasis. A fungal rash on the feet or in skinfolds. Blistering rashes, such as shingles or chickenpox. Slow movement of blood in the veins (venous stasis). Fluid buildup below the skin (edema). Have open wounds on the skin, such as cuts, puncture wounds, burns, bites, scrapes, tattoos,  piercings, or wounds from surgery. Have had radiation therapy. Use IV drugs. What are the signs or symptoms? Symptoms of this condition include: Skin that looks red, purple, or slightly darker than your usual skin color. Streaks or spots on the skin. Swollen area of the skin. Tenderness or pain when an area of the skin is touched. Warm skin. Fever or chills. Blisters. Tiredness (fatigue). How is this diagnosed? This condition is diagnosed based on a medical history and physical exam. You may also have tests, including: Blood tests. Imaging tests. Tests on a sample of fluid taken from the wound (wound culture). How is this treated? Treatment for this condition may include: Medicines. These may include antibiotics or medicines to treat allergies (antihistamines). Rest. Applying cold or warm wet cloths (compresses) to the skin. If the condition is severe, you may need to stay in the hospital and get antibiotics through an IV. The infection usually starts to get better within 1-2 days of treatment. Follow these instructions at home: Medicines Take over-the-counter and prescription medicines only as told by your health  care provider. If you were prescribed antibiotics, take them as told by your provider. Do not stop using the antibiotic even if you start to feel better. General instructions Drink enough fluid to keep your pee (urine) pale yellow. Do not touch or rub the infected area. Raise (elevate) the infected area above the level of your heart while you are sitting or lying down. Return to your normal activities as told by your provider. Ask your provider what activities are safe for you. Apply warm or cold compresses to the affected area as told by your provider. Keep all follow-up visits. Your provider will need to make sure that a more serious infection is not developing. Contact a health care provider if: You have a fever. Your symptoms do not improve within 1-2 days of  starting treatment or you develop new symptoms. Your bone or joint underneath the infected area becomes painful after the skin has healed. Your infection returns in the same area or another area. Signs of this may include: You notice a swollen bump in the infected area. Your red area gets larger, turns dark in color, or becomes more painful. Drainage increases. Pus or a bad smell develops in your infected area. You have more pain. You feel ill and have muscle aches and weakness. You develop vomiting or diarrhea that will not go away. Get help right away if: You notice red streaks coming from the infected area. You notice the skin turns purple or black and falls off. This symptom may be an emergency. Get help right away. Call 911. Do not wait to see if the symptom will go away. Do not drive yourself to the hospital. This information is not intended to replace advice given to you by your health care provider. Make sure you discuss any questions you have with your health care provider. Document Revised: 01/16/2022 Document Reviewed: 01/16/2022 Elsevier Patient Education  2024 [employer].

## 2022-12-31 NOTE — Chronic Care Management (AMB) (Signed)
Chronic Care Management   CCM RN Visit Note  12/31/2022 Name: Philip Richardson MRN: [id number] DOB: Dec 16, 1955  Subjective: Philip Richardson is a 68 y.o. year old male who is a primary care patient of Philip Pierini, FNP. The patient was referred to the Chronic Care Management team for assistance with care management needs subsequent to provider initiation of CCM services and plan of care.    Today's Visit:  Engaged with patient by telephone for follow up visit.        Goals Addressed             This Visit's Progress    CCM (CHRONIC KIDNEY DISEASE) EXPECTED OUTCOME: MONITOR, SELF-MANAGE AND REDUCE SYMPTOMS OF CHRONIC KIDNEY DISEASE       Current Barriers:  Knowledge Deficits related to Chronic Kidney Disease management Chronic Disease Management support and education needs related to CKD, diet No Advanced Directives in place- pt declines information Patient reports he lives alone, has girlfriend and sister he can call on if needed Patient reports he is independent with all aspects of his care Patient reports he does not follow a special diet, pt does exercise- walks 30 minutes daily Patient reports he recently followed up with nephrologist and "nothing was any worse", no new concerns reported Patient recently had cellulitis to right middle finger, saw primary care provider, has now finished doxycycline, states still slightly swollen, no redness, no drainage, no signs or symptoms of infection, verbalizes understanding to contact primary care provider if worsens  Planned Interventions: Evaluation of current treatment plan related to chronic kidney disease self management and patient's adherence to plan as established by provider      Reviewed medications with patient and discussed importance of compliance    Counseled on the importance of exercise goals with target of 150 minutes per week     Advised patient, providing education and rationale, to monitor blood pressure daily and  record, calling PCP for findings outside established parameters    Discussed complications of poorly controlled blood pressure such as heart disease, stroke, circulatory complications, vision complications, kidney impairment, sexual dysfunction    Advised patient to discuss any changes in health status, issues with medications with provider    Engage patient in early, proactive and ongoing discussion about goals of care and what matters most to them    Reviewed upcoming scheduled appointments including primary care provider in August Reinforced instructions from nephrologist- Be careful with potassium intake, drink 2-2.5 liters of water to help prevent kidney stones, limit sodium to less than 2.3 grams per day, increase fruit and vegetable intake Reviewed signs/ symptoms of infection and importance of contacting primary care provider for worsening symptoms of cellulitis  Symptom Management: Take medications as prescribed   Attend all scheduled provider appointments Call pharmacy for medication refills 3-7 days in advance of running out of medications Attend church or other social activities Perform all self care activities independently  Perform IADL's (shopping, preparing meals, housekeeping, managing finances) independently Call provider office for new concerns or questions  Follow low sodium diet Read food labels for sodium content and limit intake to less than 2.3 grams per day Drink 2- 2.5 liters of water Be careful with potassium intake Increase fruit and vegetable intake Please contact your primary care provider if right middle finger looks worse or infection (redness, drainage, increased swelling, fever)  Follow Up Plan: Telephone follow up appointment with care management team member scheduled for:  02/28/23 at 130 pm  CCM (DIABETES) EXPECTED OUTCOME:  MONITOR, SELF-MANAGE AND REDUCE SYMPTOMS OF DIABETES       Current Barriers:  Knowledge Deficits related to Diabetes  management Chronic Disease Management support and education needs related to Diabetes, diet No Advanced Directives in place- pt declines information Patient reports CBG is now monitored with [facility], reports no further hypoglycemic episodes, fasting CBG is in the 90's range with today's reading 84, pt states he does not eat a bedtime snack and his last meal is dinner at 530-6 pm Patient reports he does not follow a special diet and eats out daily, does not like a variety of vegetables Patient reports he is not taking Rybelsus, could not tolerate and plans to discuss with primary care provider at next appointment in August  Planned Interventions: Reviewed medications with patient and discussed importance of medication adherence;        Counseled on importance of regular laboratory monitoring as prescribed;        Advised patient, providing education and rationale, to check cbg per doctor's order  and record        call provider for findings outside established parameters;       Review of patient status, including review of consultants reports, relevant laboratory and other test results, and medications completed;       Reinforced effects elevated CBG has on the body including kidneys and eyes Reinforced carbohydrate modified diet Encouraged pt to eat a bedtime snack including carbohydrate and protein Reinforced signs/ symptoms hypoglycemia and interventions  Symptom Management: Take medications as prescribed   Attend all scheduled provider appointments Call pharmacy for medication refills 3-7 days in advance of running out of medications Attend church or other social activities Perform all self care activities independently  Perform IADL's (shopping, preparing meals, housekeeping, managing finances) independently Call provider office for new concerns or questions  check blood sugar at prescribed times: per doctor's order  check feet daily for cuts, sores or redness enter blood  sugar readings and medication or insulin into daily log take the blood sugar log to all doctor visits take the blood sugar meter to all doctor visits trim toenails straight across drink 6 to 8 glasses of water each day fill half of plate with vegetables limit fast food meals to no more than 1 per week manage portion size prepare main meal at home 3 to 5 days each week read food labels for fat, fiber, carbohydrates and portion size set a realistic goal Be mindful of your carbohydrate intake at each meal- too much rice, pasta, bread, potatoes, fruit at one meal can elevate your blood sugar Try eating a bedtime snack including carbohydrate and protein Follow RULE OF 15 for low blood sugar management:  How to treat low blood sugars (Blood sugar less than 70 mg/dl  Please follow the RULE OF 15 for the treatment of hypoglycemia treatment (When your blood sugars are less than 70 mg/ dl) STEP  1:  Take 15 grams of carbohydrates when your blood sugar is low, which includes:   3-4 glucose tabs or  3-4 oz of juice or regular soda or  One tube of glucose gel STEP 2:  Recheck blood sugar in 15 minutes STEP 3:  If your blood sugar is still low at the 15 minute recheck ---then, go back to STEP 1 and treat again with another 15 grams of carbohydrates  Follow Up Plan: Telephone follow up appointment with care management team member scheduled for:  02/28/23 at 130  pm          Plan:Telephone follow up appointment with care management team member scheduled for:  02/28/23 at 130 pm  Irving Shows [facility], BSN RN Case Manager [facility] [phone]

## 2023-01-02 DIAGNOSIS — Z794 Long term (current) use of insulin: Secondary | ICD-10-CM | POA: Diagnosis not present

## 2023-01-02 DIAGNOSIS — E1122 Type 2 diabetes mellitus with diabetic chronic kidney disease: Secondary | ICD-10-CM | POA: Diagnosis not present

## 2023-01-02 DIAGNOSIS — N185 Chronic kidney disease, stage 5: Secondary | ICD-10-CM | POA: Diagnosis not present

## 2023-01-14 DIAGNOSIS — L820 Inflamed seborrheic keratosis: Secondary | ICD-10-CM | POA: Diagnosis not present

## 2023-01-14 DIAGNOSIS — X32XXXD Exposure to sunlight, subsequent encounter: Secondary | ICD-10-CM | POA: Diagnosis not present

## 2023-01-14 DIAGNOSIS — L570 Actinic keratosis: Secondary | ICD-10-CM | POA: Diagnosis not present

## 2023-01-14 DIAGNOSIS — Z08 Encounter for follow-up examination after completed treatment for malignant neoplasm: Secondary | ICD-10-CM | POA: Diagnosis not present

## 2023-01-14 DIAGNOSIS — Z85828 Personal history of other malignant neoplasm of skin: Secondary | ICD-10-CM | POA: Diagnosis not present

## 2023-01-28 ENCOUNTER — Ambulatory Visit (INDEPENDENT_AMBULATORY_CARE_PROVIDER_SITE_OTHER): Payer: Medicare HMO | Admitting: Nurse Practitioner

## 2023-01-28 ENCOUNTER — Encounter: Payer: Self-pay | Admitting: Nurse Practitioner

## 2023-01-28 VITALS — BP 158/74 | HR 73 | Temp 97.7°F | Resp 20 | Ht 73.0 in | Wt 199.0 lb

## 2023-01-28 DIAGNOSIS — K219 Gastro-esophageal reflux disease without esophagitis: Secondary | ICD-10-CM

## 2023-01-28 DIAGNOSIS — E785 Hyperlipidemia, unspecified: Secondary | ICD-10-CM | POA: Diagnosis not present

## 2023-01-28 DIAGNOSIS — D649 Anemia, unspecified: Secondary | ICD-10-CM | POA: Diagnosis not present

## 2023-01-28 DIAGNOSIS — N185 Chronic kidney disease, stage 5: Secondary | ICD-10-CM | POA: Diagnosis not present

## 2023-01-28 DIAGNOSIS — Z794 Long term (current) use of insulin: Secondary | ICD-10-CM

## 2023-01-28 DIAGNOSIS — E119 Type 2 diabetes mellitus without complications: Secondary | ICD-10-CM

## 2023-01-28 DIAGNOSIS — E875 Hyperkalemia: Secondary | ICD-10-CM

## 2023-01-28 DIAGNOSIS — E1169 Type 2 diabetes mellitus with other specified complication: Secondary | ICD-10-CM

## 2023-01-28 DIAGNOSIS — I10 Essential (primary) hypertension: Secondary | ICD-10-CM | POA: Diagnosis not present

## 2023-01-28 DIAGNOSIS — Z7984 Long term (current) use of oral hypoglycemic drugs: Secondary | ICD-10-CM

## 2023-01-28 LAB — BAYER DCA HB A1C WAIVED: HB A1C (BAYER DCA - WAIVED): 9.2 % — ABNORMAL HIGH (ref 4.8–5.6)

## 2023-01-28 MED ORDER — TRESIBA FLEXTOUCH 200 UNIT/ML ~~LOC~~ SOPN
90.0000 [IU] | PEN_INJECTOR | Freq: Every day | SUBCUTANEOUS | 5 refills | Status: DC
Start: 2023-01-28 — End: 2023-04-05

## 2023-01-28 MED ORDER — GLIMEPIRIDE 4 MG PO TABS: 8.0000 mg | ORAL_TABLET | Freq: Every day | ORAL | 1 refills | Status: DC

## 2023-01-28 NOTE — Progress Notes (Signed)
Subjective:    Patient ID: Philip Richardson, male    DOB: 04-18-1956, 68 y.o.   MRN: [id number]   Chief Complaint: medical management of chronic issues     HPI:  Philip Richardson is a 67 y.o. who identifies as a male who was assigned male at birth.   Social history: Lives with: by himself Work history: works part time  at a [employer] in today for follow up of the following chronic medical issues:  1. Primary hypertension No c/o chest pain, sob or headache. Does not check blood pressure at home BP Readings from Last 3 Encounters:  12/14/22 (!) 156/87  11/08/22 (!) 140/67  08/13/22 110/60     2. Hyperlipidemia associated with type 2 diabetes mellitus (HCC) Does not really watch diet and does no dedicated exercise. Statins cause myalgia- refuses repatha Lab Results  Component Value Date   CHOL 217 (H) 11/08/2022   HDL 37 (L) 11/08/2022   LDLCALC 134 (H) 11/08/2022   LDLDIRECT 113 (H) 04/08/2018   TRIG 255 (H) 11/08/2022   CHOLHDL 5.9 (H) 11/08/2022      3. Diabetes mellitus treated with insulin and oral medication [facility]) Patient saw clinical pharmacist on 12/31/22. She discussed diet with him. No medication changes. I saw him on 12/14/22 and he was given more rybellisus samples . Fasting blood sugars are running around. He had to stop rybellsus because of nausea.  Lab Results  Component Value Date   HGBA1C 9.9 (H) 12/14/2022     4. Hyperkalemia No c/o muscle cramps  5. Gastroesophageal reflux disease, unspecified whether esophagitis present Is on protonix daily an dis doing well  6. CKD (chronic kidney disease) stage 5, GFR less than 15 ml/min (HCC) No voiding issues Lab Results  Component Value Date   CREATININE 2.21 (H) 11/08/2022     7. Normocytic anemia No c/o fatigue Lab Results  Component Value Date   HGB 13.4 11/08/2022      New complaints: None today  Allergies  Allergen Reactions   Atorvastatin     Myopathy/weakness    Invokana [Canagliflozin] Other (See Comments)    weakness   Outpatient Encounter Medications as of 01/28/2023  Medication Sig   acetaminophen (TYLENOL) 325 MG tablet Take 325-650 mg by mouth every 6 (six) hours as needed (for pain.).   amLODipine (NORVASC) 10 MG tablet Take 1 tablet (10 mg total) by mouth daily.   Blood Glucose Monitoring Suppl (ONETOUCH VERIO FLEX SYSTEM) w/Device KIT Use to test blood sugar twice daily. DX E11.9   cloNIDine (CATAPRES) 0.1 MG tablet TAKE ONE TABLET THREE TIMES DAILY   Continuous Blood Gluc Receiver (FREESTYLE LIBRE 2 READER) DEVI 1 each by Does not apply route daily.   Continuous Glucose Sensor (FREESTYLE LIBRE 2 SENSOR) MISC 1 each by Does not apply route every 14 (fourteen) days.   doxycycline (VIBRA-TABS) 100 MG tablet Take 1 tablet (100 mg total) by mouth 2 (two) times daily. 1 po bid (Patient not taking: Reported on 12/31/2022)   FARXIGA 10 MG TABS tablet Take 1 tablet (10 mg total) by mouth daily before breakfast.   ferrous sulfate 325 (65 FE) MG tablet Take 1 tablet (325 mg total) by mouth daily with breakfast.   fluticasone (FLONASE) 50 MCG/ACT nasal spray Place 2 sprays into both nostrils daily. (Patient not taking: Reported on 12/31/2022)   glimepiride (AMARYL) 4 MG tablet TAKE (1) TABLET DAILY BEFORE BREAKFAST.   glucose blood (ONETOUCH VERIO) test strip  Use to test blood sugar twice daily. DX E11.9   insulin degludec (TRESIBA FLEXTOUCH) 200 UNIT/ML FlexTouch Pen Inject 86 Units into the skin daily.   Insulin Pen Needle (PEN NEEDLES 31GX5/16") 31G X 8 MM MISC Use to inject insulin daily as prescribed   OneTouch Delica Lancets 30G MISC Use to test blood sugar twice daily. DX E11.9   pantoprazole (PROTONIX) 40 MG tablet Take 1 tablet (40 mg total) by mouth 2 (two) times daily.   Semaglutide (RYBELSUS) 3 MG TABS Take 1 tablet (3 mg total) by mouth daily. (Patient not taking: Reported on 12/31/2022)   No facility-administered encounter medications on file  as of 01/28/2023.    Past Surgical History:  Procedure Laterality Date   BIOPSY  04/25/2020   Procedure: BIOPSY;  Surgeon: Lanelle Bal, DO;  Location: [facility];  Service: Endoscopy;;  duodenum gastric esophagus   COLONOSCOPY WITH PROPOFOL N/A 04/25/2020   internal hemorrhoids, one 5 mm polyp in descending colon. Tubular adenoma. 5 year surveillance.   CYSTOSCOPY W/ URETERAL STENT PLACEMENT Bilateral 12/09/2013   Procedure: CYSTOSCOPY WITH RETROGRADE PYELOGRAM/URETERAL STENT PLACEMENT;  Surgeon: Sebastian Ache, MD;  Location: [facility];  Service: Urology;  Laterality: Bilateral;   CYSTOSCOPY WITH RETROGRADE PYELOGRAM, URETEROSCOPY AND STENT PLACEMENT Bilateral 09/30/2013   Procedure: CYSTOSCOPY WITH BILATERAL RETROGRADE PYELOGRAM, LEFT DIAGNOSTIC URETEROSCOPY AND Left ureteral stent;  Surgeon: Sebastian Ache, MD;  Location: [facility];  Service: Urology;  Laterality: Bilateral;   ESOPHAGOGASTRODUODENOSCOPY (EGD) WITH PROPOFOL N/A 04/25/2020   Mildly severe candida esophagitis without bleed, s/p biopsy. Suspicion for eosinophilic esophagitis but no increased eosinophils. Gastritis. Reactive gastropathy. Negative H.pylori.  +KOH prep.    PERCUTANEOUS NEPHROLITHOTRIPSY  2005   POLYPECTOMY  04/25/2020   Procedure: POLYPECTOMY;  Surgeon: Lanelle Bal, DO;  Location: [facility];  Service: Endoscopy;;  colon   ROBOT ASSISTED PYELOPLASTY N/A 12/09/2013   Procedure: ROBOTIC ASSISTED BILATERAL PYELOLITHOTOMY, RIGHT  PYELOPLASTY ;  Surgeon: Sebastian Ache, MD;  Location: [facility];  Service: Urology;  Laterality: N/A;    Family History  Problem Relation Age of Onset   Cancer Mother    Colon cancer Neg Hx    Pancreatitis Neg Hx       Controlled substance contract: n/a     Review of Systems  Constitutional:  Negative for diaphoresis.  Eyes:  Negative for pain.  Respiratory:  Negative for shortness of breath.   Cardiovascular:  Negative for chest pain, palpitations  and leg swelling.  Gastrointestinal:  Negative for abdominal pain.  Endocrine: Negative for polydipsia.  Skin:  Negative for rash.  Neurological:  Negative for dizziness, weakness and headaches.  Hematological:  Does not bruise/bleed easily.  All other systems reviewed and are negative.      Objective:   Physical Exam Vitals and nursing note reviewed.  Constitutional:      Appearance: Normal appearance. He is well-developed.  HENT:     Head: Normocephalic.     Nose: Nose normal.     Mouth/Throat:     Mouth: Mucous membranes are moist.     Pharynx: Oropharynx is clear.  Eyes:     Pupils: Pupils are equal, round, and reactive to light.  Neck:     Thyroid: No thyroid mass or thyromegaly.     Vascular: No carotid bruit or JVD.     Trachea: Phonation normal.  Cardiovascular:     Rate and Rhythm: Normal rate and regular rhythm.  Pulmonary:  Effort: Pulmonary effort is normal. No respiratory distress.     Breath sounds: Normal breath sounds.  Abdominal:     General: Bowel sounds are normal.     Palpations: Abdomen is soft.     Tenderness: There is no abdominal tenderness.  Musculoskeletal:        General: Normal range of motion.     Cervical back: Normal range of motion and neck supple.  Lymphadenopathy:     Cervical: No cervical adenopathy.  Skin:    General: Skin is warm and dry.  Neurological:     Mental Status: He is alert and oriented to person, place, and time.  Psychiatric:        Behavior: Behavior normal.        Thought Content: Thought content normal.        Judgment: Judgment normal.    BP (!) 158/74   Pulse 73   Temp 97.7 F (36.5 C) (Temporal)   Resp 20   Ht 6\' 1"  (1.854 m)   Wt 199 lb (90.3 kg)   SpO2 97%   BMI 26.25 kg/m         Assessment & Plan:  Philip Richardson comes in today with chief complaint of Diabetes   Diagnosis and orders addressed:  1. Primary hypertension Low sodium diet  2. Hyperlipidemia associated with type 2  diabetes mellitus (HCC) Low fat diet  3. Diabetes mellitus treated with insulin and oral medication (HCC) Increase tresiba  to 90u daily Increase amaryl to 8mg  daily - Bayer DCA Hb A1c Waived - insulin degludec (TRESIBA FLEXTOUCH) 200 UNIT/ML FlexTouch Pen; Inject 90 Units into the skin daily.  Dispense: 15 mL; Refill: 5  4. Hyperkalemia Not time for labs  5. Gastroesophageal reflux disease, unspecified whether esophagitis present Avoid spicy foods Do not eat 2 hours prior to bedtime   6. CKD (chronic kidney disease) stage 5, GFR less than 15 ml/min (HCC) Not time for labs  7. Normocytic anemia Not time for labs  abs pending Health Maintenance reviewed Diet and exercise encouraged  Follow up plan: 2 months   Mary-Margaret Daphine Deutscher, FNP

## 2023-01-30 ENCOUNTER — Telehealth: Payer: Self-pay | Admitting: Pharmacist

## 2023-01-30 DIAGNOSIS — E1142 Type 2 diabetes mellitus with diabetic polyneuropathy: Secondary | ICD-10-CM

## 2023-01-30 MED ORDER — FREESTYLE LIBRE 2 SENSOR MISC
1.0000 | 5 refills | Status: DC
Start: 2023-01-30 — End: 2023-01-30

## 2023-01-30 MED ORDER — FREESTYLE LIBRE 2 SENSOR MISC
1.0000 | 5 refills | Status: DC
Start: 2023-01-30 — End: 2023-04-05

## 2023-01-30 NOTE — Telephone Encounter (Signed)
   Patient is in need of Libre 2 CGM refills.  His sensor fell off and he does not have a way to check blood sugars.  We will send in refills as it appears he can refill sensors based on fill history.  Pharmacy to follow up for in person visit to be scheduled. [id number] placed to pharmacy.   Kieth Brightly, PharmD, BCACP Clinical Pharmacist, [facility]

## 2023-01-31 ENCOUNTER — Other Ambulatory Visit: Payer: Self-pay | Admitting: Nurse Practitioner

## 2023-01-31 ENCOUNTER — Telehealth: Payer: Self-pay

## 2023-01-31 DIAGNOSIS — I10 Essential (primary) hypertension: Secondary | ICD-10-CM

## 2023-01-31 DIAGNOSIS — E1142 Type 2 diabetes mellitus with diabetic polyneuropathy: Secondary | ICD-10-CM | POA: Diagnosis not present

## 2023-01-31 NOTE — Progress Notes (Signed)
   Care Guide Note  01/31/2023 Name: [name] MRN: [id number] DOB: 08-20-1955  Referred by: [name], FNP Reason for referral : Care Coordination (Outreach to schedule referral with Pharm d)   Philip Richardson is a 67 y.o. year old male who is a primary care patient of [name], FNP. Bhavya Dech was referred to the pharmacist for assistance related to DM.    Successful contact was made with the patient to discuss pharmacy services including being ready for the pharmacist to call at least 5 minutes before the scheduled appointment time, to have medication bottles and any blood sugar or blood pressure readings ready for review. The patient agreed to meet with the pharmacist via with the pharmacist via Face to face  on (date/time).   03/14/2023 Penne Lash, RMA Care Guide [facility]  [place], [place] [address] Direct Dial: [phone] [email or web address]

## 2023-01-31 NOTE — Progress Notes (Signed)
   Care Guide Note  01/31/2023 Name: Romar Hornick MRN: [id number] DOB: 11/07/1955  Referred by: [name], FNP Reason for referral : Care Coordination (Outreach to schedule referral with Pharm d)   Darle Newberg is a 67 y.o. year old male who is a primary care patient of [name], FNP. Syd Verrill was referred to the pharmacist for assistance related to DM.    An unsuccessful telephone outreach was attempted today to contact the patient who was referred to the pharmacy team for assistance with medication management. Additional attempts will be made to contact the patient.   Penne Lash, RMA Care Guide [facility]  [place], [place] [address] Direct Dial: [phone] [email or web address]

## 2023-02-11 ENCOUNTER — Other Ambulatory Visit: Payer: Medicare HMO

## 2023-02-11 DIAGNOSIS — N189 Chronic kidney disease, unspecified: Secondary | ICD-10-CM | POA: Diagnosis not present

## 2023-02-11 DIAGNOSIS — Q631 Lobulated, fused and horseshoe kidney: Secondary | ICD-10-CM | POA: Diagnosis not present

## 2023-02-11 DIAGNOSIS — E1129 Type 2 diabetes mellitus with other diabetic kidney complication: Secondary | ICD-10-CM | POA: Diagnosis not present

## 2023-02-11 DIAGNOSIS — I129 Hypertensive chronic kidney disease with stage 1 through stage 4 chronic kidney disease, or unspecified chronic kidney disease: Secondary | ICD-10-CM | POA: Diagnosis not present

## 2023-02-11 DIAGNOSIS — N200 Calculus of kidney: Secondary | ICD-10-CM | POA: Diagnosis not present

## 2023-02-11 DIAGNOSIS — R809 Proteinuria, unspecified: Secondary | ICD-10-CM | POA: Diagnosis not present

## 2023-02-11 DIAGNOSIS — E1122 Type 2 diabetes mellitus with diabetic chronic kidney disease: Secondary | ICD-10-CM | POA: Diagnosis not present

## 2023-02-19 DIAGNOSIS — I10 Essential (primary) hypertension: Secondary | ICD-10-CM | POA: Diagnosis not present

## 2023-02-19 DIAGNOSIS — E1122 Type 2 diabetes mellitus with diabetic chronic kidney disease: Secondary | ICD-10-CM | POA: Diagnosis not present

## 2023-02-19 DIAGNOSIS — N1832 Chronic kidney disease, stage 3b: Secondary | ICD-10-CM | POA: Diagnosis not present

## 2023-02-19 DIAGNOSIS — R809 Proteinuria, unspecified: Secondary | ICD-10-CM | POA: Diagnosis not present

## 2023-02-28 ENCOUNTER — Encounter: Payer: Self-pay | Admitting: *Deleted

## 2023-02-28 ENCOUNTER — Telehealth: Payer: Medicare HMO

## 2023-02-28 ENCOUNTER — Other Ambulatory Visit: Payer: Medicare HMO | Admitting: *Deleted

## 2023-02-28 NOTE — Patient Outreach (Signed)
Care Management   Visit Note  02/28/2023 Name: Philip Richardson MRN: [id number] DOB: [birth year]  Subjective: Philip Richardson is a 67 y.o. year old male who is a primary care patient of [name], FNP. The Care Management team was consulted for assistance.      Engaged with patient by telephone for follow up.   Goals Addressed             This Visit's Progress    CCM (CHRONIC KIDNEY DISEASE) EXPECTED OUTCOME: MONITOR, SELF-MANAGE AND REDUCE SYMPTOMS OF CHRONIC KIDNEY DISEASE       Current Barriers:  Knowledge Deficits related to Chronic Kidney Disease management Chronic Disease Management support and education needs related to CKD, diet No Advanced Directives in place- pt declines information Patient reports he lives alone, has girlfriend and sister he can call on if needed Patient reports he is independent with all aspects of his care Patient reports he does not follow a special diet, pt does exercise- walks 30 minutes daily Patient reports he recently followed up with nephrologist and "nothing was any worse", no new concerns reported Patient had cellulitis to right middle finger several months ago, saw primary care provider, has now finished doxycycline, states still slightly swollen, no redness, no drainage, no signs or symptoms of infection, verbalizes understanding to contact primary care provider if worsens No new concerns reported today  Planned Interventions: Evaluation of current treatment plan related to chronic kidney disease self management and patient's adherence to plan as established by provider      Reviewed medications with patient and discussed importance of compliance    Counseled on the importance of exercise goals with target of 150 minutes per week     Advised patient, providing education and rationale, to monitor blood pressure daily and record, calling PCP for findings outside established parameters    Discussed complications of poorly controlled  blood pressure such as heart disease, stroke, circulatory complications, vision complications, kidney impairment, sexual dysfunction    Advised patient to discuss any changes in health status, issues with medications with provider    Engage patient in early, proactive and ongoing discussion about goals of care and what matters most to them    Reviewed upcoming scheduled appointments including primary care provider  Reviewed instructions from nephrologist- Be careful with potassium intake, drink 2-2.5 liters of water to help prevent kidney stones, limit sodium to less than 2.3 grams per day, increase fruit and vegetable intake Reinforced signs/ symptoms of infection and importance of contacting primary care provider for worsening symptoms of cellulitis Reviewed effects uncontrolled blood sugar has on the kidneys  Symptom Management: Take medications as prescribed   Attend all scheduled provider appointments Call pharmacy for medication refills 3-7 days in advance of running out of medications Attend church or other social activities Perform all self care activities independently  Perform IADL's (shopping, preparing meals, housekeeping, managing finances) independently Call provider office for new concerns or questions  Follow low sodium diet Read food labels for sodium content and limit intake to less than 2.3 grams per day Drink 2- 2.5 liters of water Be careful with potassium intake Increase fruit and vegetable intake Please contact your primary care provider if right middle finger looks worse or infection (redness, drainage, increased swelling, fever)  Follow Up Plan: Telephone follow up appointment with care management team member scheduled for:  05/09/23 at 3 pm       CCM (DIABETES) EXPECTED OUTCOME:  MONITOR, SELF-MANAGE AND REDUCE SYMPTOMS OF DIABETES  Current Barriers:  Knowledge Deficits related to Diabetes management Chronic Disease Management support and education needs  related to Diabetes, diet No Advanced Directives in place- pt declines information Patient reports CBG is now monitored with [facility], reports one hypoglycemic episode of 63 due to not eating dinner the night before, fasting CBG is in the 90's range with today's reading 90, pt states he does not eat a bedtime snack and his last meal is dinner at 530-6 pm Patient reports he does not follow a special diet and eats out daily, does not like a variety of vegetables  Planned Interventions: Reviewed medications with patient and discussed importance of medication adherence;        Counseled on importance of regular laboratory monitoring as prescribed;        Advised patient, providing education and rationale, to check cbg per doctor's order  and record        call provider for findings outside established parameters;       Review of patient status, including review of consultants reports, relevant laboratory and other test results, and medications completed;       Reviewed effects elevated CBG has on the body including kidneys and eyes Reviewed carbohydrate modified diet Encouraged pt to eat a bedtime snack including carbohydrate and protein Encouraged pt to eat 3 meals per day around the same time each day Reviewed signs/ symptoms hypoglycemia and interventions  Symptom Management: Take medications as prescribed   Attend all scheduled provider appointments Call pharmacy for medication refills 3-7 days in advance of running out of medications Attend church or other social activities Perform all self care activities independently  Perform IADL's (shopping, preparing meals, housekeeping, managing finances) independently Call provider office for new concerns or questions  check blood sugar at prescribed times: per doctor's order  check feet daily for cuts, sores or redness enter blood sugar readings and medication or insulin into daily log take the blood sugar log to all doctor visits take  the blood sugar meter to all doctor visits trim toenails straight across drink 6 to 8 glasses of water each day fill half of plate with vegetables limit fast food meals to no more than 1 per week manage portion size prepare main meal at home 3 to 5 days each week read food labels for fat, fiber, carbohydrates and portion size set a realistic goal Be mindful of your carbohydrate intake at each meal- too much rice, pasta, bread, potatoes, fruit at one meal can elevate your blood sugar Try eating a bedtime snack including carbohydrate and protein Eat 3 meals per day, try to eat around the same time each day Follow RULE OF 15 for low blood sugar management:  How to treat low blood sugars (Blood sugar less than 70 mg/dl  Please follow the RULE OF 15 for the treatment of hypoglycemia treatment (When your blood sugars are less than 70 mg/ dl) STEP  1:  Take 15 grams of carbohydrates when your blood sugar is low, which includes:   3-4 glucose tabs or  3-4 oz of juice or regular soda or  One tube of glucose gel STEP 2:  Recheck blood sugar in 15 minutes STEP 3:  If your blood sugar is still low at the 15 minute recheck ---then, go back to STEP 1 and treat again with another 15 grams of carbohydrates  Follow Up Plan: Telephone follow up appointment with care management team member scheduled for:  05/09/23 at 3 pm  Plan: Telephone follow up appointment with care management team member scheduled for:  05/09/23 at 3 pm  Irving Shows [facility], BSN [place]/ Ambulatory Care Management [phone]

## 2023-02-28 NOTE — Patient Instructions (Signed)
Visit Information  Thank you for taking time to visit with me today. Please don't hesitate to contact me if I can be of assistance to you before our next scheduled telephone appointment.  Following are the goals we discussed today:   Goals Addressed             This Visit's Progress    CCM (CHRONIC KIDNEY DISEASE) EXPECTED OUTCOME: MONITOR, SELF-MANAGE AND REDUCE SYMPTOMS OF CHRONIC KIDNEY DISEASE       Current Barriers:  Knowledge Deficits related to Chronic Kidney Disease management Chronic Disease Management support and education needs related to CKD, diet No Advanced Directives in place- pt declines information Patient reports he lives alone, has girlfriend and sister he can call on if needed Patient reports he is independent with all aspects of his care Patient reports he does not follow a special diet, pt does exercise- walks 30 minutes daily Patient reports he recently followed up with nephrologist and "nothing was any worse", no new concerns reported Patient had cellulitis to right middle finger several months ago, saw primary care provider, has now finished doxycycline, states still slightly swollen, no redness, no drainage, no signs or symptoms of infection, verbalizes understanding to contact primary care provider if worsens No new concerns reported today  Planned Interventions: Evaluation of current treatment plan related to chronic kidney disease self management and patient's adherence to plan as established by provider      Reviewed medications with patient and discussed importance of compliance    Counseled on the importance of exercise goals with target of 150 minutes per week     Advised patient, providing education and rationale, to monitor blood pressure daily and record, calling PCP for findings outside established parameters    Discussed complications of poorly controlled blood pressure such as heart disease, stroke, circulatory complications, vision complications,  kidney impairment, sexual dysfunction    Advised patient to discuss any changes in health status, issues with medications with provider    Engage patient in early, proactive and ongoing discussion about goals of care and what matters most to them    Reviewed upcoming scheduled appointments including primary care provider  Reviewed instructions from nephrologist- Be careful with potassium intake, drink 2-2.5 liters of water to help prevent kidney stones, limit sodium to less than 2.3 grams per day, increase fruit and vegetable intake Reinforced signs/ symptoms of infection and importance of contacting primary care provider for worsening symptoms of cellulitis Reviewed effects uncontrolled blood sugar has on the kidneys  Symptom Management: Take medications as prescribed   Attend all scheduled provider appointments Call pharmacy for medication refills 3-7 days in advance of running out of medications Attend church or other social activities Perform all self care activities independently  Perform IADL's (shopping, preparing meals, housekeeping, managing finances) independently Call provider office for new concerns or questions  Follow low sodium diet Read food labels for sodium content and limit intake to less than 2.3 grams per day Drink 2- 2.5 liters of water Be careful with potassium intake Increase fruit and vegetable intake Please contact your primary care provider if right middle finger looks worse or infection (redness, drainage, increased swelling, fever)  Follow Up Plan: Telephone follow up appointment with care management team member scheduled for:  05/09/23 at 3 pm       CCM (DIABETES) EXPECTED OUTCOME:  MONITOR, SELF-MANAGE AND REDUCE SYMPTOMS OF DIABETES       Current Barriers:  Knowledge Deficits related to Diabetes management Chronic Disease  Management support and education needs related to Diabetes, diet No Advanced Directives in place- pt declines information Patient  reports CBG is now monitored with Freestyle Libre, reports one hypoglycemic episode of 63 due to not eating dinner the night before, fasting CBG is in the 90's range with today's reading 90, pt states he does not eat a bedtime snack and his last meal is dinner at 530-6 pm Patient reports he does not follow a special diet and eats out daily, does not like a variety of vegetables  Planned Interventions: Reviewed medications with patient and discussed importance of medication adherence;        Counseled on importance of regular laboratory monitoring as prescribed;        Advised patient, providing education and rationale, to check cbg per doctor's order  and record        call provider for findings outside established parameters;       Review of patient status, including review of consultants reports, relevant laboratory and other test results, and medications completed;       Reviewed effects elevated CBG has on the body including kidneys and eyes Reviewed carbohydrate modified diet Encouraged pt to eat a bedtime snack including carbohydrate and protein Encouraged pt to eat 3 meals per day around the same time each day Reviewed signs/ symptoms hypoglycemia and interventions  Symptom Management: Take medications as prescribed   Attend all scheduled provider appointments Call pharmacy for medication refills 3-7 days in advance of running out of medications Attend church or other social activities Perform all self care activities independently  Perform IADL's (shopping, preparing meals, housekeeping, managing finances) independently Call provider office for new concerns or questions  check blood sugar at prescribed times: per doctor's order  check feet daily for cuts, sores or redness enter blood sugar readings and medication or insulin into daily log take the blood sugar log to all doctor visits take the blood sugar meter to all doctor visits trim toenails straight across drink 6 to 8  glasses of water each day fill half of plate with vegetables limit fast food meals to no more than 1 per week manage portion size prepare main meal at home 3 to 5 days each week read food labels for fat, fiber, carbohydrates and portion size set a realistic goal Be mindful of your carbohydrate intake at each meal- too much rice, pasta, bread, potatoes, fruit at one meal can elevate your blood sugar Try eating a bedtime snack including carbohydrate and protein Eat 3 meals per day, try to eat around the same time each day Follow RULE OF 15 for low blood sugar management:  How to treat low blood sugars (Blood sugar less than 70 mg/dl  Please follow the RULE OF 15 for the treatment of hypoglycemia treatment (When your blood sugars are less than 70 mg/ dl) STEP  1:  Take 15 grams of carbohydrates when your blood sugar is low, which includes:   3-4 glucose tabs or  3-4 oz of juice or regular soda or  One tube of glucose gel STEP 2:  Recheck blood sugar in 15 minutes STEP 3:  If your blood sugar is still low at the 15 minute recheck ---then, go back to STEP 1 and treat again with another 15 grams of carbohydrates  Follow Up Plan: Telephone follow up appointment with care management team member scheduled for:  05/09/23 at 3 pm           Our next appointment is  by telephone on 05/09/23 at 3 pm  Please call the care guide team at [phone] if you need to cancel or reschedule your appointment.   If you are experiencing a Mental Health or Behavioral Health Crisis or need someone to talk to, please call the Suicide and Crisis Lifeline: 988 call the [place] National Suicide Prevention Lifeline: (670)846-4407 or TTY: 915-836-5781 TTY 954-231-7946) to talk to a trained counselor call 1-800-273-TALK (toll free, 24 hour hotline) go to [facility] [address], [place] [phone]) call the [facility] Crisis Line: [phone] call 911   Patient  verbalizes understanding of instructions and care plan provided today and agrees to view in MyChart. Active MyChart status and patient understanding of how to access instructions and care plan via MyChart confirmed with patient.     Telephone follow up appointment with care management team member scheduled for: 05/09/23 at 3 pm  Irving Shows [facility], BSN [place]/ Ambulatory Care Management [phone]

## 2023-03-04 ENCOUNTER — Other Ambulatory Visit: Payer: Self-pay | Admitting: Nurse Practitioner

## 2023-03-04 DIAGNOSIS — I10 Essential (primary) hypertension: Secondary | ICD-10-CM

## 2023-03-05 ENCOUNTER — Other Ambulatory Visit: Payer: Self-pay | Admitting: Nurse Practitioner

## 2023-03-05 DIAGNOSIS — I10 Essential (primary) hypertension: Secondary | ICD-10-CM

## 2023-03-13 NOTE — Progress Notes (Signed)
03/13/2023 Name: Philip Richardson MRN: [id number] DOB: 07-13-1955  No chief complaint on file.   Philip Richardson is a 67 y.o. year old male who was referred for medication management by their primary care provider, [name], FNP. They presented for a face to face visit today.   They were referred to the pharmacist by their PCP for assistance in managing diabetes    Subjective:  Care Team: Primary Care Provider: [name], FNP ; Next Scheduled Visit: *** {careteamprovider:27366}  Medication Access/Adherence  Current Pharmacy:  [facility] [place], [place] - [address] [place] [address] Phone: [phone] Fax: [phone]  [facility] - [place], [place] - [address] [place] [address] Phone: [phone] Fax: [phone]   Patient reports affordability concerns with their medications: {YES/NO:21197} Patient reports access/transportation concerns to their pharmacy: {YES/NO:21197} Patient reports adherence concerns with their medications:  {YES/NO:21197} ***   Diabetes:  Current medications:  Farxiga 10 mg daily  Glimepiride 8 mg daily  Tresiba 90 units daily   Medications tried in the past:  Levemir 40 units twice daily  Lantus 80 units daily  Jentadueto (Linagliptin/Metformin) 2.10-998 mg twice daily  Rybelsus 3 mg daily  Invokana (intolerance)  Current glucose readings: *** Libre 2 CGM Sensor   @CGMDOWNLOAD @  Patient {Actions; denies-reports:120008} hypoglycemic s/sx including ***dizziness, shakiness, sweating. Patient {Actions; denies-reports:120008} hyperglycemic symptoms including ***polyuria, polydipsia, polyphagia, nocturia, neuropathy, blurred vision.  Current meal patterns:  - Breakfast: *** - Lunch *** - Supper *** - Snacks *** - Drinks ***  Current physical activity: ***  Current medication access support: ***   Objective:  Lab Results   Component Value Date   HGBA1C 9.2 (H) 01/28/2023    Lab Results  Component Value Date   CREATININE 2.21 (H) 11/08/2022   BUN 30 (H) 11/08/2022   NA 135 11/08/2022   K 4.8 11/08/2022   CL 98 11/08/2022   CO2 25 11/08/2022    Lab Results  Component Value Date   CHOL 217 (H) 11/08/2022   HDL 37 (L) 11/08/2022   LDLCALC 134 (H) 11/08/2022   LDLDIRECT 113 (H) 04/08/2018   TRIG 255 (H) 11/08/2022   CHOLHDL 5.9 (H) 11/08/2022    Medications Reviewed Today   Medications were not reviewed in this encounter       Assessment/Plan:   Diabetes: - Currently uncontrolled. A1c above goal at 9.2 (goal <7) - Reviewed long term cardiovascular and renal outcomes of uncontrolled blood sugar - Reviewed goal A1c, goal fasting, and goal 2 hour post prandial glucose - Reviewed dietary modifications including *** - Reviewed lifestyle modifications including: - Recommend to ***  - Recommend to check glucose *** - Meets financial criteria for *** patient assistance program through ***. Will collaborate with provider, CPhT, and patient to pursue assistance.     Follow Up Plan: ***  Sofie Rower, PharmD [facility] PGY-1

## 2023-03-14 ENCOUNTER — Ambulatory Visit (INDEPENDENT_AMBULATORY_CARE_PROVIDER_SITE_OTHER): Payer: Medicare HMO | Admitting: Pharmacist

## 2023-03-14 DIAGNOSIS — E119 Type 2 diabetes mellitus without complications: Secondary | ICD-10-CM | POA: Diagnosis not present

## 2023-03-14 DIAGNOSIS — Z794 Long term (current) use of insulin: Secondary | ICD-10-CM

## 2023-03-14 DIAGNOSIS — Z7984 Long term (current) use of oral hypoglycemic drugs: Secondary | ICD-10-CM

## 2023-03-28 ENCOUNTER — Ambulatory Visit: Payer: Medicare HMO | Admitting: Nurse Practitioner

## 2023-03-28 ENCOUNTER — Encounter: Payer: Self-pay | Admitting: Nurse Practitioner

## 2023-03-28 NOTE — Progress Notes (Deleted)
Subjective:    Patient ID: Philip Richardson, male    DOB: [birth year], 67 y.o.   MRN: [id number]   Chief Complaint: medical management of chronic issues     HPI:  Philip Richardson is a 67 y.o. who identifies as a male who was assigned male at birth.   Social history: Lives with: by himself Work history: works part time at [employer] in today for follow up of the following chronic medical issues:  1. Type 2 diabetes mellitus with diabetic polyneuropathy, with long-term current use of insulin (HCC) Fasting blood sugars have been running around. He doe snot check them daily. At last visit we increased tresiba to 90u and amaryl to 8mg  daily. Lab Results  Component Value Date   HGBA1C 9.2 (H) 01/28/2023     2. Primary hypertension No c/o chest pain, sob or headache. Does not check blood pressure at home. BP Readings from Last 3 Encounters:  01/28/23 (!) 158/74  12/14/22 (!) 156/87  11/08/22 (!) 140/67     3. Hyperlipidemia associated with type 2 diabetes mellitus (HCC) Does not really watch diet very closely. Does no exercise. Lab Results  Component Value Date   CHOL 217 (H) 11/08/2022   HDL 37 (L) 11/08/2022   LDLCALC 134 (H) 11/08/2022   LDLDIRECT 113 (H) 04/08/2018   TRIG 255 (H) 11/08/2022   CHOLHDL 5.9 (H) 11/08/2022     4. Gastroesophageal reflux disease, unspecified whether esophagitis present Is on protonix and is doing well  5. Diabetes mellitus treated with insulin and oral medication (HCC) See note above  6. CKD (chronic kidney disease) stage 5, GFR less than 15 ml/min (HCC) No voiding issues. See nephrologist Lab Results  Component Value Date   CREATININE 2.21 (H) 11/08/2022     7. Hyperkalemia No c/o muscle cramps Lab Results  Component Value Date   K 4.8 11/08/2022     8. Normocytic anemia Lab Results  Component Value Date   HGB 13.4 11/08/2022     9. Elevated LFTs Lab Results  Component Value Date   ALT 23  11/08/2022   AST 14 11/08/2022   ALKPHOS 105 11/08/2022   BILITOT 0.3 11/08/2022     10. Slow transit constipation ***   New complaints: ***  Allergies  Allergen Reactions   Atorvastatin     Myopathy/weakness   Invokana [Canagliflozin] Other (See Comments)    weakness   Semaglutide Other (See Comments)    Heartburn   Outpatient Encounter Medications as of 03/28/2023  Medication Sig   acetaminophen (TYLENOL) 325 MG tablet Take 325-650 mg by mouth every 6 (six) hours as needed (for pain.).   amLODipine (NORVASC) 10 MG tablet TAKE 1 TABLET DAILY   Blood Glucose Monitoring Suppl (ONETOUCH VERIO FLEX SYSTEM) w/Device KIT Use to test blood sugar twice daily. DX E11.9   cloNIDine (CATAPRES) 0.1 MG tablet TAKE ONE TABLET THREE TIMES DAILY   Continuous Blood Gluc Receiver (FREESTYLE LIBRE 2 READER) DEVI 1 each by Does not apply route daily.   Continuous Glucose Sensor (FREESTYLE LIBRE 2 SENSOR) MISC 1 each by Does not apply route every 14 (fourteen) days. DX: E11.65, Z79.4   FARXIGA 10 MG TABS tablet Take 1 tablet (10 mg total) by mouth daily before breakfast.   ferrous sulfate 325 (65 FE) MG tablet Take 1 tablet (325 mg total) by mouth daily with breakfast.   fluticasone (FLONASE) 50 MCG/ACT nasal spray Place 2 sprays into both nostrils daily.  glimepiride (AMARYL) 4 MG tablet Take 2 tablets (8 mg total) by mouth daily with breakfast.   glucose blood (ONETOUCH VERIO) test strip Use to test blood sugar twice daily. DX E11.9   insulin degludec (TRESIBA FLEXTOUCH) 200 UNIT/ML FlexTouch Pen Inject 90 Units into the skin daily.   Insulin Pen Needle (PEN NEEDLES 31GX5/16") 31G X 8 MM MISC Use to inject insulin daily as prescribed   OneTouch Delica Lancets 30G MISC Use to test blood sugar twice daily. DX E11.9   pantoprazole (PROTONIX) 40 MG tablet Take 1 tablet (40 mg total) by mouth 2 (two) times daily.   No facility-administered encounter medications on file as of 03/28/2023.    Past  Surgical History:  Procedure Laterality Date   BIOPSY  04/25/2020   Procedure: BIOPSY;  Surgeon: Lanelle Bal, DO;  [facility]: [facility];  Service: Endoscopy;;  duodenum gastric esophagus   COLONOSCOPY WITH PROPOFOL N/A 04/25/2020   internal hemorrhoids, one 5 mm polyp in descending colon. Tubular adenoma. 5 year surveillance.   CYSTOSCOPY W/ URETERAL STENT PLACEMENT Bilateral 12/09/2013   Procedure: CYSTOSCOPY WITH RETROGRADE PYELOGRAM/URETERAL STENT PLACEMENT;  Surgeon: Sebastian Ache, MD;  Location: [facility];  Service: Urology;  Laterality: Bilateral;   CYSTOSCOPY WITH RETROGRADE PYELOGRAM, URETEROSCOPY AND STENT PLACEMENT Bilateral 09/30/2013   Procedure: CYSTOSCOPY WITH BILATERAL RETROGRADE PYELOGRAM, LEFT DIAGNOSTIC URETEROSCOPY AND Left ureteral stent;  Surgeon: Sebastian Ache, MD;  Location: [facility];  Service: Urology;  Laterality: Bilateral;   ESOPHAGOGASTRODUODENOSCOPY (EGD) WITH PROPOFOL N/A 04/25/2020   Mildly severe candida esophagitis without bleed, s/p biopsy. Suspicion for eosinophilic esophagitis but no increased eosinophils. Gastritis. Reactive gastropathy. Negative H.pylori.  +KOH prep.    PERCUTANEOUS NEPHROLITHOTRIPSY  2005   POLYPECTOMY  04/25/2020   Procedure: POLYPECTOMY;  Surgeon: Lanelle Bal, DO;  Location: [facility];  Service: Endoscopy;;  colon   ROBOT ASSISTED PYELOPLASTY N/A 12/09/2013   Procedure: ROBOTIC ASSISTED BILATERAL PYELOLITHOTOMY, RIGHT  PYELOPLASTY ;  Surgeon: Sebastian Ache, MD;  Location: [facility];  Service: Urology;  Laterality: N/A;    Family History  Problem Relation Age of Onset   Cancer Mother    Colon cancer Neg Hx    Pancreatitis Neg Hx       Controlled substance contract: ***     Review of Systems     Objective:   Physical Exam        Assessment & Plan:

## 2023-04-05 ENCOUNTER — Ambulatory Visit (INDEPENDENT_AMBULATORY_CARE_PROVIDER_SITE_OTHER): Payer: Medicare HMO | Admitting: Nurse Practitioner

## 2023-04-05 ENCOUNTER — Encounter: Payer: Self-pay | Admitting: Nurse Practitioner

## 2023-04-05 VITALS — BP 152/78 | HR 65 | Temp 98.0°F | Resp 20 | Ht 73.0 in | Wt 192.0 lb

## 2023-04-05 DIAGNOSIS — Z23 Encounter for immunization: Secondary | ICD-10-CM

## 2023-04-05 DIAGNOSIS — Z7984 Long term (current) use of oral hypoglycemic drugs: Secondary | ICD-10-CM

## 2023-04-05 DIAGNOSIS — D649 Anemia, unspecified: Secondary | ICD-10-CM | POA: Diagnosis not present

## 2023-04-05 DIAGNOSIS — I10 Essential (primary) hypertension: Secondary | ICD-10-CM | POA: Diagnosis not present

## 2023-04-05 DIAGNOSIS — E875 Hyperkalemia: Secondary | ICD-10-CM | POA: Diagnosis not present

## 2023-04-05 DIAGNOSIS — E1142 Type 2 diabetes mellitus with diabetic polyneuropathy: Secondary | ICD-10-CM

## 2023-04-05 DIAGNOSIS — K219 Gastro-esophageal reflux disease without esophagitis: Secondary | ICD-10-CM

## 2023-04-05 DIAGNOSIS — E785 Hyperlipidemia, unspecified: Secondary | ICD-10-CM | POA: Diagnosis not present

## 2023-04-05 DIAGNOSIS — N185 Chronic kidney disease, stage 5: Secondary | ICD-10-CM | POA: Diagnosis not present

## 2023-04-05 DIAGNOSIS — E1122 Type 2 diabetes mellitus with diabetic chronic kidney disease: Secondary | ICD-10-CM | POA: Diagnosis not present

## 2023-04-05 DIAGNOSIS — E1169 Type 2 diabetes mellitus with other specified complication: Secondary | ICD-10-CM

## 2023-04-05 DIAGNOSIS — Z794 Long term (current) use of insulin: Secondary | ICD-10-CM

## 2023-04-05 DIAGNOSIS — E119 Type 2 diabetes mellitus without complications: Secondary | ICD-10-CM

## 2023-04-05 DIAGNOSIS — R7989 Other specified abnormal findings of blood chemistry: Secondary | ICD-10-CM

## 2023-04-05 LAB — LIPID PANEL
Chol/HDL Ratio: 5.3 ratio — ABNORMAL HIGH (ref 0.0–5.0)
Cholesterol, Total: 239 mg/dL — ABNORMAL HIGH (ref 100–199)
HDL: 45 mg/dL (ref >39)
LDL Chol Calc (NIH): 168 mg/dL — ABNORMAL HIGH (ref 0–99)
Triglycerides: 141 mg/dL (ref 0–149)
VLDL Cholesterol Cal: 26 mg/dL (ref 5–40)

## 2023-04-05 LAB — CBC WITH DIFFERENTIAL/PLATELET
Basophils Absolute: 0.1 10*3/uL (ref 0.0–0.2)
Basos: 1 %
EOS (ABSOLUTE): 0.1 10*3/uL (ref 0.0–0.4)
Eos: 1 %
Hematocrit: 45.8 % (ref 37.5–51.0)
Hemoglobin: 14.7 g/dL (ref 13.0–17.7)
Immature Grans (Abs): 0 10*3/uL (ref 0.0–0.1)
Immature Granulocytes: 0 %
Lymphocytes Absolute: 1.4 10*3/uL (ref 0.7–3.1)
Lymphs: 26 %
MCH: 27.4 pg (ref 26.6–33.0)
MCHC: 32.1 g/dL (ref 31.5–35.7)
MCV: 85 fL (ref 79–97)
Monocytes Absolute: 0.5 10*3/uL (ref 0.1–0.9)
Monocytes: 9 %
Neutrophils Absolute: 3.5 10*3/uL (ref 1.4–7.0)
Neutrophils: 63 %
Platelets: 217 10*3/uL (ref 150–450)
RBC: 5.37 x10E6/uL (ref 4.14–5.80)
RDW: 13.3 % (ref 11.6–15.4)
WBC: 5.6 10*3/uL (ref 3.4–10.8)

## 2023-04-05 LAB — CMP14+EGFR
ALT: 29 [IU]/L (ref 0–44)
AST: 26 [IU]/L (ref 0–40)
Albumin: 4.2 g/dL (ref 3.9–4.9)
Alkaline Phosphatase: 103 [IU]/L (ref 44–121)
BUN/Creatinine Ratio: 11 (ref 10–24)
BUN: 22 mg/dL (ref 8–27)
Bilirubin Total: 0.4 mg/dL (ref 0.0–1.2)
CO2: 23 mmol/L (ref 20–29)
Calcium: 10 mg/dL (ref 8.6–10.2)
Chloride: 102 mmol/L (ref 96–106)
Creatinine, Ser: 1.94 mg/dL — ABNORMAL HIGH (ref 0.76–1.27)
Globulin, Total: 2.5 g/dL (ref 1.5–4.5)
Glucose: 96 mg/dL (ref 70–99)
Potassium: 4.3 mmol/L (ref 3.5–5.2)
Sodium: 142 mmol/L (ref 134–144)
Total Protein: 6.7 g/dL (ref 6.0–8.5)
eGFR: 37 mL/min/{1.73_m2} — ABNORMAL LOW (ref >59)

## 2023-04-05 LAB — BAYER DCA HB A1C WAIVED: HB A1C (BAYER DCA - WAIVED): 9.9 % — ABNORMAL HIGH (ref 4.8–5.6)

## 2023-04-05 MED ORDER — CLONIDINE HCL 0.1 MG PO TABS: 0.1000 mg | ORAL_TABLET | Freq: Three times a day (TID) | ORAL | 1 refills | Status: DC

## 2023-04-05 MED ORDER — FERROUS SULFATE 325 (65 FE) MG PO TABS: 325.0000 mg | ORAL_TABLET | Freq: Every day | ORAL | 1 refills | Status: DC

## 2023-04-05 MED ORDER — FREESTYLE LIBRE 2 SENSOR MISC: 1.0000 | 5 refills | Status: DC

## 2023-04-05 MED ORDER — AMLODIPINE BESYLATE 10 MG PO TABS: 10.0000 mg | ORAL_TABLET | Freq: Every day | ORAL | 1 refills | Status: DC

## 2023-04-05 MED ORDER — PANTOPRAZOLE SODIUM 40 MG PO TBEC: 40.0000 mg | DELAYED_RELEASE_TABLET | Freq: Two times a day (BID) | ORAL | 1 refills | Status: DC

## 2023-04-05 MED ORDER — TRESIBA FLEXTOUCH 200 UNIT/ML ~~LOC~~ SOPN: 100.0000 [IU] | PEN_INJECTOR | Freq: Every day | SUBCUTANEOUS | 5 refills | Status: DC

## 2023-04-05 NOTE — Progress Notes (Signed)
Subjective:    Patient ID: Philip Richardson, male    DOB: 09/19/1955, 67 y.o.   MRN: [id number]   Chief Complaint: medical management of chronic issues     HPI:  Philip Richardson is a 67 y.o. who identifies as a male who was assigned male at birth.   Social history: Lives with: by himself Work history: works part time at [employer] in today for follow up of the following chronic medical issues:  1. Type 2 diabetes mellitus with diabetic polyneuropathy, with long-term current use of insulin (HCC) Fasting blood sugars have been running around. He doe snot check them daily. At last visit we increased tresiba to 90u and amaryl to 8mg  daily. Lab Results  Component Value Date   HGBA1C 9.2 (H) 01/28/2023     2. Primary hypertension No c/o chest pain, sob or headache. Does not check blood pressure at home. He has not taken his hypertensive yet this morning BP Readings from Last 3 Encounters:  01/28/23 (!) 158/74  12/14/22 (!) 156/87  11/08/22 (!) 140/67     3. Hyperlipidemia associated with type 2 diabetes mellitus (HCC) Does not really watch diet very closely. Does no exercise. Lab Results  Component Value Date   CHOL 217 (H) 11/08/2022   HDL 37 (L) 11/08/2022   LDLCALC 134 (H) 11/08/2022   LDLDIRECT 113 (H) 04/08/2018   TRIG 255 (H) 11/08/2022   CHOLHDL 5.9 (H) 11/08/2022     4. Gastroesophageal reflux disease, unspecified whether esophagitis present Is on protonix and is doing well  5. Diabetes mellitus treated with insulin and oral medication (HCC) See note above  6. CKD (chronic kidney disease) stage 5, GFR less than 15 ml/min (HCC) No voiding issues. See nephrologist Lab Results  Component Value Date   CREATININE 2.21 (H) 11/08/2022     7. Hyperkalemia No c/o muscle cramps Lab Results  Component Value Date   K 4.8 11/08/2022     8. Normocytic anemia Lab Results  Component Value Date   HGB 13.4 11/08/2022     9. Elevated  LFTs Lab Results  Component Value Date   ALT 23 11/08/2022   AST 14 11/08/2022   ALKPHOS 105 11/08/2022   BILITOT 0.3 11/08/2022     10. Slow transit constipation Has not recently had any issues   New complaints: None today  Allergies  Allergen Reactions   Atorvastatin     Myopathy/weakness   Invokana [Canagliflozin] Other (See Comments)    weakness   Semaglutide Other (See Comments)    Heartburn   Outpatient Encounter Medications as of 04/05/2023  Medication Sig   acetaminophen (TYLENOL) 325 MG tablet Take 325-650 mg by mouth every 6 (six) hours as needed (for pain.).   amLODipine (NORVASC) 10 MG tablet TAKE 1 TABLET DAILY   Blood Glucose Monitoring Suppl (ONETOUCH VERIO FLEX SYSTEM) w/Device KIT Use to test blood sugar twice daily. DX E11.9   cloNIDine (CATAPRES) 0.1 MG tablet TAKE ONE TABLET THREE TIMES DAILY   Continuous Blood Gluc Receiver (FREESTYLE LIBRE 2 READER) DEVI 1 each by Does not apply route daily.   Continuous Glucose Sensor (FREESTYLE LIBRE 2 SENSOR) MISC 1 each by Does not apply route every 14 (fourteen) days. DX: E11.65, Z79.4   FARXIGA 10 MG TABS tablet Take 1 tablet (10 mg total) by mouth daily before breakfast.   ferrous sulfate 325 (65 FE) MG tablet Take 1 tablet (325 mg total) by mouth daily with breakfast.  fluticasone (FLONASE) 50 MCG/ACT nasal spray Place 2 sprays into both nostrils daily.   glimepiride (AMARYL) 4 MG tablet Take 2 tablets (8 mg total) by mouth daily with breakfast.   glucose blood (ONETOUCH VERIO) test strip Use to test blood sugar twice daily. DX E11.9   insulin degludec (TRESIBA FLEXTOUCH) 200 UNIT/ML FlexTouch Pen Inject 90 Units into the skin daily.   Insulin Pen Needle (PEN NEEDLES 31GX5/16") 31G X 8 MM MISC Use to inject insulin daily as prescribed   OneTouch Delica Lancets 30G MISC Use to test blood sugar twice daily. DX E11.9   pantoprazole (PROTONIX) 40 MG tablet Take 1 tablet (40 mg total) by mouth 2 (two) times daily.    No facility-administered encounter medications on file as of 04/05/2023.    Past Surgical History:  Procedure Laterality Date   BIOPSY  04/25/2020   Procedure: BIOPSY;  Surgeon: Lanelle Bal, DO;  Location: [facility];  Service: Endoscopy;;  duodenum gastric esophagus   COLONOSCOPY WITH PROPOFOL N/A 04/25/2020   internal hemorrhoids, one 5 mm polyp in descending colon. Tubular adenoma. 5 year surveillance.   CYSTOSCOPY W/ URETERAL STENT PLACEMENT Bilateral 12/09/2013   Procedure: CYSTOSCOPY WITH RETROGRADE PYELOGRAM/URETERAL STENT PLACEMENT;  Surgeon: Sebastian Ache, MD;  Location: [facility];  Service: Urology;  Laterality: Bilateral;   CYSTOSCOPY WITH RETROGRADE PYELOGRAM, URETEROSCOPY AND STENT PLACEMENT Bilateral 09/30/2013   Procedure: CYSTOSCOPY WITH BILATERAL RETROGRADE PYELOGRAM, LEFT DIAGNOSTIC URETEROSCOPY AND Left ureteral stent;  Surgeon: Sebastian Ache, MD;  Location: [facility];  Service: Urology;  Laterality: Bilateral;   ESOPHAGOGASTRODUODENOSCOPY (EGD) WITH PROPOFOL N/A 04/25/2020   Mildly severe candida esophagitis without bleed, s/p biopsy. Suspicion for eosinophilic esophagitis but no increased eosinophils. Gastritis. Reactive gastropathy. Negative H.pylori.  +KOH prep.    PERCUTANEOUS NEPHROLITHOTRIPSY  2005   POLYPECTOMY  04/25/2020   Procedure: POLYPECTOMY;  Surgeon: Lanelle Bal, DO;  Location: [facility];  Service: Endoscopy;;  colon   ROBOT ASSISTED PYELOPLASTY N/A 12/09/2013   Procedure: ROBOTIC ASSISTED BILATERAL PYELOLITHOTOMY, RIGHT  PYELOPLASTY ;  Surgeon: Sebastian Ache, MD;  Location: [facility];  Service: Urology;  Laterality: N/A;    Family History  Problem Relation Age of Onset   Cancer Mother    Colon cancer Neg Hx    Pancreatitis Neg Hx       Controlled substance contract: n/a     Review of Systems  Constitutional:  Negative for diaphoresis.  Eyes:  Negative for pain.  Respiratory:  Negative for shortness of breath.    Cardiovascular:  Negative for chest pain, palpitations and leg swelling.  Gastrointestinal:  Negative for abdominal pain.  Endocrine: Negative for polydipsia.  Skin:  Negative for rash.  Neurological:  Negative for dizziness, weakness and headaches.  Hematological:  Does not bruise/bleed easily.  All other systems reviewed and are negative.      Objective:   Physical Exam Vitals and nursing note reviewed.  Constitutional:      Appearance: Normal appearance. He is well-developed.  HENT:     Head: Normocephalic.     Nose: Nose normal.     Mouth/Throat:     Mouth: Mucous membranes are moist.     Pharynx: Oropharynx is clear.  Eyes:     Pupils: Pupils are equal, round, and reactive to light.  Neck:     Thyroid: No thyroid mass or thyromegaly.     Vascular: No carotid bruit or JVD.     Trachea: Phonation normal.  Cardiovascular:  Rate and Rhythm: Normal rate and regular rhythm.  Pulmonary:     Effort: Pulmonary effort is normal. No respiratory distress.     Breath sounds: Normal breath sounds.  Abdominal:     General: Bowel sounds are normal.     Palpations: Abdomen is soft.     Tenderness: There is no abdominal tenderness.  Musculoskeletal:        General: Normal range of motion.     Cervical back: Normal range of motion and neck supple.  Lymphadenopathy:     Cervical: No cervical adenopathy.  Skin:    General: Skin is warm and dry.  Neurological:     Mental Status: He is alert and oriented to person, place, and time.  Psychiatric:        Behavior: Behavior normal.        Thought Content: Thought content normal.        Judgment: Judgment normal.     BP (!) 152/78 (Patient Position: Sitting, Cuff Size: Normal)   Pulse 65   Temp 98 F (36.7 C) (Temporal)   Resp 20   Ht 6\' 1"  (1.854 m)   Wt 192 lb (87.1 kg)   SpO2 99%   BMI 25.33 kg/m   HGBA1c 9.9%       Assessment & Plan:   Philip Richardson comes in today with chief complaint of Medical Management of  Chronic Issues   Diagnosis and orders addressed:  1. Type 2 diabetes mellitus with diabetic polyneuropathy, with long-term current use of insulin (HCC) Increase tresiba to 100u daily- if this does not bring hgba1c down will have to go to meal time insulin with sliding scale - Bayer DCA Hb A1c Waived - Microalbumin / creatinine urine ratio - insulin degludec (TRESIBA FLEXTOUCH) 200 UNIT/ML FlexTouch Pen; Inject 100 Units into the skin daily.  Dispense: 15 mL; Refill: 5 - Continuous Glucose Sensor (FREESTYLE LIBRE 2 SENSOR) MISC; 1 each by Does not apply route every 14 (fourteen) days. DX: E11.65, Z79.4  Dispense: 2 each; Refill: 5  2. Primary hypertension Low sodium diet - CBC with Differential/Platelet - CMP14+EGFR - amLODipine (NORVASC) 10 MG tablet; Take 1 tablet (10 mg total) by mouth daily.  Dispense: 90 tablet; Refill: 1 - cloNIDine (CATAPRES) 0.1 MG tablet; Take 1 tablet (0.1 mg total) by mouth 3 (three) times daily.  Dispense: 90 tablet; Refill: 1  3. Hyperlipidemia associated with type 2 diabetes mellitus (HCC) Low fat diet - Lipid panel  4. Diabetes mellitus treated with insulin and oral medication (HCC)  5. Gastroesophageal reflux disease, unspecified whether esophagitis present Avoid spicy foods Do not eat 2 hours prior to bedtime  - pantoprazole (PROTONIX) 40 MG tablet; Take 1 tablet (40 mg total) by mouth 2 (two) times daily.  Dispense: 180 tablet; Refill: 1  6. Hyperkalemia Labs pending  7. CKD (chronic kidney disease) stage 5, GFR less than 15 ml/min (HCC) Labs pending  8. Normocytic anemia Labs pending - ferrous sulfate 325 (65 FE) MG tablet; Take 1 tablet (325 mg total) by mouth daily with breakfast.  Dispense: 90 tablet; Refill: 1  9. Elevated LFTs Labs pending   Labs pending Health Maintenance reviewed Diet and exercise encouraged  Follow up plan: 1 month   Mary-Margaret Daphine Deutscher, FNP

## 2023-04-15 ENCOUNTER — Telehealth: Payer: Self-pay | Admitting: Nurse Practitioner

## 2023-04-15 DIAGNOSIS — E1142 Type 2 diabetes mellitus with diabetic polyneuropathy: Secondary | ICD-10-CM | POA: Diagnosis not present

## 2023-04-15 NOTE — Telephone Encounter (Signed)
Copied from CRM [id number]. Topic: General - Call Back - No Documentation >> Apr 15, 2023  9:27 AM Mosetta Putt H wrote: Reason for CRM: mandy left a message about medical records

## 2023-04-15 NOTE — Telephone Encounter (Signed)
Contacted patient and he was requesting his last lab results. Results given and patient verbalized understanding

## 2023-04-15 NOTE — Telephone Encounter (Signed)
Have you all called him about medication assistance recently? We have not contacted him about any medical records on our end

## 2023-05-06 ENCOUNTER — Ambulatory Visit (INDEPENDENT_AMBULATORY_CARE_PROVIDER_SITE_OTHER): Payer: Medicare HMO | Admitting: Nurse Practitioner

## 2023-05-06 ENCOUNTER — Encounter: Payer: Self-pay | Admitting: Nurse Practitioner

## 2023-05-06 VITALS — BP 139/81 | HR 82 | Temp 97.9°F | Resp 20 | Ht 73.0 in | Wt 197.0 lb

## 2023-05-06 DIAGNOSIS — Z794 Long term (current) use of insulin: Secondary | ICD-10-CM | POA: Diagnosis not present

## 2023-05-06 DIAGNOSIS — E1142 Type 2 diabetes mellitus with diabetic polyneuropathy: Secondary | ICD-10-CM

## 2023-05-06 LAB — BAYER DCA HB A1C WAIVED: HB A1C (BAYER DCA - WAIVED): 9.3 % — ABNORMAL HIGH (ref 4.8–5.6)

## 2023-05-06 MED ORDER — HUMALOG JUNIOR KWIKPEN 100 UNIT/ML ~~LOC~~ SOPN: PEN_INJECTOR | SUBCUTANEOUS | 3 refills | Status: DC

## 2023-05-06 MED ORDER — INSULIN LISPRO 100 UNIT/ML IJ SOLN: INTRAMUSCULAR | 11 refills | Status: DC

## 2023-05-06 NOTE — Telephone Encounter (Signed)
Copied from CRM [id number]. Topic: Clinical - Medication Question >> May 06, 2023 10:34 AM Donita Brooks wrote: Reason for CRM: Josh from [facility] is calling wondering if it was be easy for the pt to use the insulin pen instead of the insulit valve that was prescribe to him. Josh call back is [phone]

## 2023-05-06 NOTE — Addendum Note (Signed)
Addended by: [name] on: 05/06/2023 01:24 PM   Modules accepted: Orders

## 2023-05-06 NOTE — Patient Instructions (Signed)
If blood sugar is: 100-150-2u 151-200-4u 201-250-6u 251-300-8u >301-10u at lunch and supper.

## 2023-05-06 NOTE — Progress Notes (Signed)
Subjective:    Patient ID: Philip Richardson, male    DOB: 1956-05-30, 67 y.o.   MRN: [id number]   Chief Complaint: Diabetes   Diabetes Pertinent negatives for hypoglycemia include no dizziness or headaches. Pertinent negatives for diabetes include no chest pain, no polydipsia and no weakness.    Patient was last seen on 04/05/23. His hgba1c was 9.9. we increased tresiba to 100 u daily. He has a libre on and in the mornings his blood sugar is 60-80 and will goes as high as 300 throughout the day. He basically eats what he wants to. Patient Active Problem List   Diagnosis Date Noted   CKD (chronic kidney disease) stage 5, GFR less than 15 ml/min (HCC) 06/05/2022   Drug-induced myopathy 03/14/2020   Normocytic anemia 10/09/2019   Elevated LFTs    GERD (gastroesophageal reflux disease) 07/16/2019   Hyperkalemia 07/16/2019   Constipation 07/16/2019   Hyperlipidemia associated with type 2 diabetes mellitus (HCC) 02/01/2015   Staghorn kidney stones 12/09/2013   Hypertension 10/13/2013   Diabetes mellitus treated with insulin and oral medication (HCC) 09/10/2013       Review of Systems  Constitutional:  Negative for diaphoresis.  Eyes:  Negative for pain.  Respiratory:  Negative for shortness of breath.   Cardiovascular:  Negative for chest pain, palpitations and leg swelling.  Gastrointestinal:  Negative for abdominal pain.  Endocrine: Negative for polydipsia.  Skin:  Negative for rash.  Neurological:  Negative for dizziness, weakness and headaches.  Hematological:  Does not bruise/bleed easily.  All other systems reviewed and are negative.      Objective:   Physical Exam Vitals and nursing note reviewed.  Constitutional:      Appearance: Normal appearance. He is well-developed.  Neck:     Thyroid: No thyroid mass or thyromegaly.     Vascular: No carotid bruit or JVD.     Trachea: Phonation normal.  Cardiovascular:     Rate and Rhythm: Normal rate and regular rhythm.   Pulmonary:     Effort: Pulmonary effort is normal. No respiratory distress.     Breath sounds: Normal breath sounds.  Abdominal:     General: Bowel sounds are normal.     Palpations: Abdomen is soft.     Tenderness: There is no abdominal tenderness.  Musculoskeletal:        General: Normal range of motion.     Cervical back: Normal range of motion and neck supple.  Lymphadenopathy:     Cervical: No cervical adenopathy.  Skin:    General: Skin is warm and dry.  Neurological:     Mental Status: He is alert and oriented to person, place, and time.  Psychiatric:        Behavior: Behavior normal.        Thought Content: Thought content normal.        Judgment: Judgment normal.    BP 139/81   Pulse 82   Temp 97.9 F (36.6 C) (Temporal)   Resp 20   Ht 6\' 1"  (1.854 m)   Wt 197 lb (89.4 kg)   SpO2 99%   BMI 25.99 kg/m   Hgba1c 9.3%       Assessment & Plan:  Bray Lemoine in today with chief complaint of Diabetes   1. Type 2 diabetes mellitus with diabetic polyneuropathy, with long-term current use of insulin (HCC) Going to strat on sliding scale 2x a day for now Continue tresiba at bedtime - Bayer DCA Hb A1c  Waived - insulin lispro (HUMALOG) 100 UNIT/ML injection; If blood sugar is 100-150-2u;151-200-4u;201-250-6u;251-300-8u;>301-10u at lunch and supper.  Dispense: 10 mL; Refill: 11    The above assessment and management plan was discussed with the patient. The patient verbalized understanding of and has agreed to the management plan. Patient is aware to call the clinic if symptoms persist or worsen. Patient is aware when to return to the clinic for a follow-up visit. Patient educated on when it is appropriate to go to the emergency department.   Mary-Margaret Daphine Deutscher, FNP

## 2023-05-09 ENCOUNTER — Other Ambulatory Visit: Payer: Self-pay

## 2023-05-09 NOTE — Patient Outreach (Signed)
Care Management   Visit Note  05/09/2023 Name: Philip Richardson MRN: [id number] DOB: 06-10-1955  Subjective: Philip Richardson is a 67 y.o. year old male who is a primary care patient of Philip Pierini, FNP. The Care Management team was consulted for assistance.      Engaged with patient spoke with patient by telephone.    Goals Addressed             This Visit's Progress    RNCM Care Management  (CHRONIC KIDNEY DISEASE) EXPECTED OUTCOME: MONITOR, SELF-MANAGE AND REDUCE SYMPTOMS OF CHRONIC KIDNEY DISEASE       Current Barriers:  Knowledge Deficits related to Chronic Kidney Disease management Chronic Disease Management support and education needs related to CKD, diet No Advanced Directives in place- pt declines information Patient reports he lives alone, has girlfriend and sister he can call on if needed Patient reports he is independent with all aspects of his care Patient reports he does not follow a special diet, pt does exercise- walks 30 minutes daily Patient reports he recently followed up with nephrologist and "nothing was any worse", no new concerns reported Patient had cellulitis to right middle finger several months ago, saw primary care provider, has now finished doxycycline, states still slightly swollen, no redness, no drainage, no signs or symptoms of infection, verbalizes understanding to contact primary care provider if worsens No new concerns reported today  Planned Interventions: Evaluation of current treatment plan related to chronic kidney disease self management and patient's adherence to plan as established by provider. The patient is doing well and denies any acute changes in his CKD or other chronic conditions. Saw the pcp on 05-06-2023 and is compliant with the plan of care      Reviewed medications with patient and discussed importance of compliance. Is compliant with medications administration.    Counseled on the importance of exercise goals with target of  150 minutes per week     Advised patient, providing education and rationale, to monitor blood pressure daily and record, calling PCP for findings outside established parameters    Discussed complications of poorly controlled blood pressure such as heart disease, stroke, circulatory complications, vision complications, kidney impairment, sexual dysfunction    Advised patient to discuss any changes in health status, issues with medications with provider    Engage patient in early, proactive and ongoing discussion about goals of care and what matters most to them    Reviewed upcoming scheduled appointments including primary care provider  Reviewed instructions from nephrologist- Be careful with potassium intake, drink 2-2.5 liters of water to help prevent kidney stones, limit sodium to less than 2.3 grams per day, increase fruit and vegetable intake Reinforced signs/ symptoms of infection and importance of contacting primary care provider for worsening symptoms of cellulitis Reviewed effects uncontrolled blood sugar has on the kidneys  Symptom Management: Take medications as prescribed   Attend all scheduled provider appointments Call pharmacy for medication refills 3-7 days in advance of running out of medications Attend church or other social activities Perform all self care activities independently  Perform IADL's (shopping, preparing meals, housekeeping, managing finances) independently Call provider office for new concerns or questions  Follow low sodium diet Read food labels for sodium content and limit intake to less than 2.3 grams per day Drink 2- 2.5 liters of water Be careful with potassium intake Increase fruit and vegetable intake Please contact your primary care provider if right middle finger looks worse or infection (redness, drainage, increased swelling, fever)  Follow Up Plan: Telephone follow up appointment with care management team member scheduled for:  07-04-2023 at 345 pm        RNCM care Management  (DIABETES) EXPECTED OUTCOME:  MONITOR, SELF-MANAGE AND REDUCE SYMPTOMS OF DIABETES       Current Barriers:  Knowledge Deficits related to Diabetes management Chronic Disease Management support and education needs related to Diabetes, diet No Advanced Directives in place- pt declines information Patient reports CBG is now monitored with Freestyle Libre, reports one hypoglycemic episode of 63 due to not eating dinner the night before, fasting CBG is in the 90's range with today's reading 90, pt states he does not eat a bedtime snack and his last meal is dinner at 530-6 pm Patient reports he does not follow a special diet and eats out daily, does not like a variety of vegetables Lab Results  Component Value Date   HGBA1C 9.3 (H) 05/06/2023     Planned Interventions: Reviewed medications with patient and discussed importance of medication adherence. Patient is starting a SSI BID. He states he picked up his medications today. Verbalized understanding of how to do the SSI;        Counseled on importance of regular laboratory monitoring as prescribed. Labs up to date. Review of the goal of A1C of <7.0;        Advised patient, providing education and rationale, to check cbg per doctor's order  and record. The patient states the highest he has seen recently is 350 and the lowest he has seen is 64. He says the lows only happen when he does not eat much for supper        call provider for findings outside established parameters;       Review of patient status, including review of consultants reports, relevant laboratory and other test results, and medications completed;       Reviewed effects elevated CBG has on the body including kidneys and eyes Reviewed carbohydrate modified diet. Discussed eating balanced meals and also making sure he eats a bedtime snack so that his blood sugars will not get too low during the night. The patient verbalized understanding. Encouraged pt to  eat a bedtime snack including carbohydrate and protein Encouraged pt to eat 3 meals per day around the same time each day Reviewed signs/ symptoms hypoglycemia and interventions  Symptom Management: Take medications as prescribed   Attend all scheduled provider appointments Call pharmacy for medication refills 3-7 days in advance of running out of medications Attend church or other social activities Perform all self care activities independently  Perform IADL's (shopping, preparing meals, housekeeping, managing finances) independently Call provider office for new concerns or questions  check blood sugar at prescribed times: per doctor's order  check feet daily for cuts, sores or redness enter blood sugar readings and medication or insulin into daily log take the blood sugar log to all doctor visits take the blood sugar meter to all doctor visits trim toenails straight across drink 6 to 8 glasses of water each day fill half of plate with vegetables limit fast food meals to no more than 1 per week manage portion size prepare main meal at home 3 to 5 days each week read food labels for fat, fiber, carbohydrates and portion size set a realistic goal Be mindful of your carbohydrate intake at each meal- too much rice, pasta, bread, potatoes, fruit at one meal can elevate your blood sugar Try eating a bedtime snack including carbohydrate and protein Eat  3 meals per day, try to eat around the same time each day Follow RULE OF 15 for low blood sugar management:  How to treat low blood sugars (Blood sugar less than 70 mg/dl  Please follow the RULE OF 15 for the treatment of hypoglycemia treatment (When your blood sugars are less than 70 mg/ dl) STEP  1:  Take 15 grams of carbohydrates when your blood sugar is low, which includes:   3-4 glucose tabs or  3-4 oz of juice or regular soda or  One tube of glucose gel STEP 2:  Recheck blood sugar in 15 minutes STEP 3:  If your blood sugar is still  low at the 15 minute recheck ---then, go back to STEP 1 and treat again with another 15 grams of carbohydrates  Follow Up Plan: Telephone follow up appointment with care management team member scheduled for:  07-04-2023 at 345 pm           Consent to Services:  Patient was given information about care management services, agreed to services, and gave verbal consent to participate.   Plan: Telephone follow up appointment with care management team member scheduled for: 07-04-2023 at 345 pm  Alto Denver RN, MSN, CCM RN Care Manager  [facility]  Ambulatory Care Management  Direct Number: [phone]

## 2023-05-09 NOTE — Patient Instructions (Signed)
Visit Information  Thank you for taking time to visit with me today. Please don't hesitate to contact me if I can be of assistance to you before our next scheduled telephone appointment.  Following are the goals we discussed today:   Goals Addressed             This Visit's Progress    RNCM Care Management  (CHRONIC KIDNEY DISEASE) EXPECTED OUTCOME: MONITOR, SELF-MANAGE AND REDUCE SYMPTOMS OF CHRONIC KIDNEY DISEASE       Current Barriers:  Knowledge Deficits related to Chronic Kidney Disease management Chronic Disease Management support and education needs related to CKD, diet No Advanced Directives in place- pt declines information Patient reports he lives alone, has girlfriend and sister he can call on if needed Patient reports he is independent with all aspects of his care Patient reports he does not follow a special diet, pt does exercise- walks 30 minutes daily Patient reports he recently followed up with nephrologist and "nothing was any worse", no new concerns reported Patient had cellulitis to right middle finger several months ago, saw primary care provider, has now finished doxycycline, states still slightly swollen, no redness, no drainage, no signs or symptoms of infection, verbalizes understanding to contact primary care provider if worsens No new concerns reported today  Planned Interventions: Evaluation of current treatment plan related to chronic kidney disease self management and patient's adherence to plan as established by provider. The patient is doing well and denies any acute changes in his CKD or other chronic conditions. Saw the pcp on 05-06-2023 and is compliant with the plan of care      Reviewed medications with patient and discussed importance of compliance. Is compliant with medications administration.    Counseled on the importance of exercise goals with target of 150 minutes per week     Advised patient, providing education and rationale, to monitor blood  pressure daily and record, calling PCP for findings outside established parameters    Discussed complications of poorly controlled blood pressure such as heart disease, stroke, circulatory complications, vision complications, kidney impairment, sexual dysfunction    Advised patient to discuss any changes in health status, issues with medications with provider    Engage patient in early, proactive and ongoing discussion about goals of care and what matters most to them    Reviewed upcoming scheduled appointments including primary care provider  Reviewed instructions from nephrologist- Be careful with potassium intake, drink 2-2.5 liters of water to help prevent kidney stones, limit sodium to less than 2.3 grams per day, increase fruit and vegetable intake Reinforced signs/ symptoms of infection and importance of contacting primary care provider for worsening symptoms of cellulitis Reviewed effects uncontrolled blood sugar has on the kidneys  Symptom Management: Take medications as prescribed   Attend all scheduled provider appointments Call pharmacy for medication refills 3-7 days in advance of running out of medications Attend church or other social activities Perform all self care activities independently  Perform IADL's (shopping, preparing meals, housekeeping, managing finances) independently Call provider office for new concerns or questions  Follow low sodium diet Read food labels for sodium content and limit intake to less than 2.3 grams per day Drink 2- 2.5 liters of water Be careful with potassium intake Increase fruit and vegetable intake Please contact your primary care provider if right middle finger looks worse or infection (redness, drainage, increased swelling, fever)  Follow Up Plan: Telephone follow up appointment with care management team member scheduled for:  07-04-2023  at 345 pm       RNCM care Management  (DIABETES) EXPECTED OUTCOME:  MONITOR, SELF-MANAGE AND REDUCE  SYMPTOMS OF DIABETES       Current Barriers:  Knowledge Deficits related to Diabetes management Chronic Disease Management support and education needs related to Diabetes, diet No Advanced Directives in place- pt declines information Patient reports CBG is now monitored with [facility], reports one hypoglycemic episode of 63 due to not eating dinner the night before, fasting CBG is in the 90's range with today's reading 90, pt states he does not eat a bedtime snack and his last meal is dinner at 530-6 pm Patient reports he does not follow a special diet and eats out daily, does not like a variety of vegetables Lab Results  Component Value Date   HGBA1C 9.3 (H) 05/06/2023     Planned Interventions: Reviewed medications with patient and discussed importance of medication adherence. Patient is starting a SSI BID. He states he picked up his medications today. Verbalized understanding of how to do the SSI;        Counseled on importance of regular laboratory monitoring as prescribed. Labs up to date. Review of the goal of A1C of <7.0;        Advised patient, providing education and rationale, to check cbg per doctor's order  and record. The patient states the highest he has seen recently is 350 and the lowest he has seen is 64. He says the lows only happen when he does not eat much for supper        call provider for findings outside established parameters;       Review of patient status, including review of consultants reports, relevant laboratory and other test results, and medications completed;       Reviewed effects elevated CBG has on the body including kidneys and eyes Reviewed carbohydrate modified diet. Discussed eating balanced meals and also making sure he eats a bedtime snack so that his blood sugars will not get too low during the night. The patient verbalized understanding. Encouraged pt to eat a bedtime snack including carbohydrate and protein Encouraged pt to eat 3 meals per day  around the same time each day Reviewed signs/ symptoms hypoglycemia and interventions  Symptom Management: Take medications as prescribed   Attend all scheduled provider appointments Call pharmacy for medication refills 3-7 days in advance of running out of medications Attend church or other social activities Perform all self care activities independently  Perform IADL's (shopping, preparing meals, housekeeping, managing finances) independently Call provider office for new concerns or questions  check blood sugar at prescribed times: per doctor's order  check feet daily for cuts, sores or redness enter blood sugar readings and medication or insulin into daily log take the blood sugar log to all doctor visits take the blood sugar meter to all doctor visits trim toenails straight across drink 6 to 8 glasses of water each day fill half of plate with vegetables limit fast food meals to no more than 1 per week manage portion size prepare main meal at home 3 to 5 days each week read food labels for fat, fiber, carbohydrates and portion size set a realistic goal Be mindful of your carbohydrate intake at each meal- too much rice, pasta, bread, potatoes, fruit at one meal can elevate your blood sugar Try eating a bedtime snack including carbohydrate and protein Eat 3 meals per day, try to eat around the same time each day Follow RULE OF  15 for low blood sugar management:  How to treat low blood sugars (Blood sugar less than 70 mg/dl  Please follow the RULE OF 15 for the treatment of hypoglycemia treatment (When your blood sugars are less than 70 mg/ dl) STEP  1:  Take 15 grams of carbohydrates when your blood sugar is low, which includes:   3-4 glucose tabs or  3-4 oz of juice or regular soda or  One tube of glucose gel STEP 2:  Recheck blood sugar in 15 minutes STEP 3:  If your blood sugar is still low at the 15 minute recheck ---then, go back to STEP 1 and treat again with another 15  grams of carbohydrates  Follow Up Plan: Telephone follow up appointment with care management team member scheduled for:  07-04-2023 at 345 pm           Our next appointment is by telephone on 07-04-2023 at 345 pm  Please call the care guide team at [phone] if you need to cancel or reschedule your appointment.   If you are experiencing a Mental Health or Behavioral Health Crisis or need someone to talk to, please call the Suicide and Crisis Lifeline: 988 call the [place] National Suicide Prevention Lifeline: (574) 302-3563 or TTY: 873-225-1830 TTY 628-575-5663) to talk to a trained counselor call 1-800-273-TALK (toll free, 24 hour hotline) call the [facility]: (760)597-2474   Patient verbalizes understanding of instructions and care plan provided today and agrees to view in MyChart. Active MyChart status and patient understanding of how to access instructions and care plan via MyChart confirmed with patient.       Alto Denver RN, MSN, CCM RN Care Manager  [facility]  Ambulatory Care Management  Direct Number: [phone]

## 2023-05-27 ENCOUNTER — Other Ambulatory Visit: Payer: Self-pay | Admitting: Nurse Practitioner

## 2023-06-07 ENCOUNTER — Ambulatory Visit: Payer: Medicare HMO | Admitting: Nurse Practitioner

## 2023-06-12 ENCOUNTER — Other Ambulatory Visit: Payer: Self-pay | Admitting: Nurse Practitioner

## 2023-06-12 DIAGNOSIS — I10 Essential (primary) hypertension: Secondary | ICD-10-CM

## 2023-06-14 ENCOUNTER — Ambulatory Visit (INDEPENDENT_AMBULATORY_CARE_PROVIDER_SITE_OTHER): Payer: Medicare HMO | Admitting: Nurse Practitioner

## 2023-06-14 ENCOUNTER — Encounter: Payer: Self-pay | Admitting: Nurse Practitioner

## 2023-06-14 VITALS — BP 160/80 | HR 95 | Temp 97.8°F | Ht 73.0 in | Wt 193.0 lb

## 2023-06-14 DIAGNOSIS — E1142 Type 2 diabetes mellitus with diabetic polyneuropathy: Secondary | ICD-10-CM

## 2023-06-14 DIAGNOSIS — Z794 Long term (current) use of insulin: Secondary | ICD-10-CM | POA: Diagnosis not present

## 2023-06-14 LAB — BAYER DCA HB A1C WAIVED: HB A1C (BAYER DCA - WAIVED): 9.7 % — ABNORMAL HIGH (ref 4.8–5.6)

## 2023-06-14 MED ORDER — HUMALOG JUNIOR KWIKPEN 100 UNIT/ML ~~LOC~~ SOPN: PEN_INJECTOR | SUBCUTANEOUS | 3 refills | Status: DC

## 2023-06-14 NOTE — Progress Notes (Signed)
 Subjective:    Patient ID: Philip Richardson, male    DOB: 1955/08/08, 68 y.o.   MRN: [id number]   Chief Complaint: Diabetes   Diabetes Pertinent negatives for hypoglycemia include no dizziness or headaches. Pertinent negatives for diabetes include no chest pain, no polydipsia and no weakness.    Patient is in for recheck of diabetes. We have had problems getting his HGBA1c under control. He is not good at watching his diet. His last hgba1c was 9.3. we started him on sliding scale bid and continued tresiba  at night. He is has to do less in mornings then he does in evenings on sliding scale. He says he has not been in 300 since he started sliding scale. Patient Active Problem List   Diagnosis Date Noted   CKD (chronic kidney disease) stage 5, GFR less than 15 ml/min (HCC) 06/05/2022   Drug-induced myopathy 03/14/2020   Normocytic anemia 10/09/2019   Elevated LFTs    GERD (gastroesophageal reflux disease) 07/16/2019   Hyperkalemia 07/16/2019   Constipation 07/16/2019   Hyperlipidemia associated with type 2 diabetes mellitus (HCC) 02/01/2015   Staghorn kidney stones 12/09/2013   Hypertension 10/13/2013   Diabetes mellitus treated with insulin  and oral medication (HCC) 09/10/2013       Review of Systems  Constitutional:  Negative for diaphoresis.  Eyes:  Negative for pain.  Respiratory:  Negative for shortness of breath.   Cardiovascular:  Negative for chest pain, palpitations and leg swelling.  Gastrointestinal:  Negative for abdominal pain.  Endocrine: Negative for polydipsia.  Skin:  Negative for rash.  Neurological:  Negative for dizziness, weakness and headaches.  Hematological:  Does not bruise/bleed easily.  All other systems reviewed and are negative.      Objective:   Physical Exam Constitutional:      Appearance: Normal appearance.  Cardiovascular:     Rate and Rhythm: Normal rate and regular rhythm.     Heart sounds: Normal heart sounds.  Pulmonary:      Effort: Pulmonary effort is normal.     Breath sounds: Normal breath sounds.  Skin:    General: Skin is warm.  Neurological:     General: No focal deficit present.     Mental Status: He is alert and oriented to person, place, and time.  Psychiatric:        Mood and Affect: Mood normal.        Behavior: Behavior normal.      BP (!) 160/80   Pulse 95   Temp 97.8 F (36.6 C) (Temporal)   Ht 6' 1 (1.854 m)   Wt 193 lb (87.5 kg)   SpO2 99%   BMI 25.46 kg/m   HGBA1c 9.7%     Assessment & Plan:   Philip Richardson in today with chief complaint of Diabetes   1. Type 2 diabetes mellitus with diabetic polyneuropathy, with long-term current use of insulin  (HCC) (Primary) Increased sliding scale to 3x a day. Please try to watch diet - Bayer DCA Hb A1c Waived - Insulin  lispro (HUMALOG  JUNIOR KWIKPEN) 100 UNIT/ML; If blood sugar is 100-150-2u;151-200-4u;201-250-6u;251-300-8u;>301-10u at breakfast, lunch and supper.  Dispense: 3 mL; Refill: 3    The above assessment and management plan was discussed with the patient. The patient verbalized understanding of and has agreed to the management plan. Patient is aware to call the clinic if symptoms persist or worsen. Patient is aware when to return to the clinic for a follow-up visit. Patient educated on when it is appropriate  to go to the emergency department.   Mary-Margaret Gladis, FNP

## 2023-06-17 DIAGNOSIS — X32XXXD Exposure to sunlight, subsequent encounter: Secondary | ICD-10-CM | POA: Diagnosis not present

## 2023-06-17 DIAGNOSIS — L570 Actinic keratosis: Secondary | ICD-10-CM | POA: Diagnosis not present

## 2023-06-17 DIAGNOSIS — M10042 Idiopathic gout, left hand: Secondary | ICD-10-CM | POA: Diagnosis not present

## 2023-07-03 ENCOUNTER — Telehealth: Payer: Self-pay

## 2023-07-03 NOTE — Telephone Encounter (Signed)
Copied from CRM [id number]. Topic: Clinical - Medication Question >> Jul 03, 2023  3:30 PM Joanette Gula wrote: Patient asked for the Pharmacist to please call him at [phone] regarding FARXIGA 10 MG TABS tablet.

## 2023-07-04 ENCOUNTER — Telehealth: Payer: Self-pay | Admitting: Pharmacist

## 2023-07-04 ENCOUNTER — Ambulatory Visit: Payer: Self-pay | Admitting: *Deleted

## 2023-07-04 DIAGNOSIS — N185 Chronic kidney disease, stage 5: Secondary | ICD-10-CM

## 2023-07-04 MED ORDER — FARXIGA 10 MG PO TABS: 10.0000 mg | ORAL_TABLET | Freq: Every day | ORAL | 5 refills | Status: DC

## 2023-07-04 NOTE — Patient Outreach (Signed)
Care Coordination   Follow Up Visit Note   07/05/2023 updated note for 07/04/23 Name: Philip Richardson MRN: [id number] DOB: 08-26-1955  Philip Richardson is a 68 y.o. year old male who sees [name], FNP for primary care. I spoke with  Everardo Beals by phone today.  What matters to the patients health and wellness today?  Management of diabetes  States he is not sure if he has Chronic Kidney disease (CKD) Request to know more about the diagnosis and listed active CKD date and treatment     Goals Addressed             This Visit's Progress    RNCM care Management  (DIABETES) EXPECTED OUTCOME:  MONITOR, SELF-MANAGE AND REDUCE SYMPTOMS OF DIABETES   Not on track    Current Barriers:  Knowledge Deficits related to Diabetes management Chronic Disease Management support and education needs related to Diabetes, diet No Advanced Directives in place- pt declines information Patient reports CBG is now monitored with [facility], he does not eat a bedtime snack and his last meal is dinner at 530-6 pm Patient reports he does not follow a special diet and eats out daily, does not like a variety of vegetables- open to RN CM sending information to assist with better choices when eating out Lab Results  Component Value Date   HGBA1C 9.7 (H) 06/14/2023     Planned Interventions:    Counseled on importance of regular laboratory monitoring as prescribed. Labs up to date. Review of the goal of A1C of <7.0;         Reviewed effects elevated CBG has on the body including kidneys and eyes Reviewed carbohydrate modified diet. Discussed eating balanced meals and also making sure he eats a bedtime snack so that his blood sugars will not get too low during the night. The patient verbalized understanding. Encouraged pt to eat a bedtime snack including carbohydrate and protein Encouraged pt to eat 3 meals per day around the same time each day Reviewed signs/ symptoms hypoglycemia and  interventions  Symptom Management: Take medications as prescribed   Attend all scheduled provider appointments Call pharmacy for medication refills 3-7 days in advance of running out of medications Attend church or other social activities Perform all self care activities independently  Perform IADL's (shopping, preparing meals, housekeeping, managing finances) independently Call provider office for new concerns or questions  check blood sugar at prescribed times: per doctor's order  check feet daily for cuts, sores or redness enter blood sugar readings and medication or insulin into daily log take the blood sugar log to all doctor visits take the blood sugar meter to all doctor visits trim toenails straight across drink 6 to 8 glasses of water each day fill half of plate with vegetables limit fast food meals to no more than 1 per week manage portion size prepare main meal at home 3 to 5 days each week read food labels for fat, fiber, carbohydrates and portion size set a realistic goal Interventions Today    Flowsheet Row Most Recent Value  Chronic Disease   Chronic disease during today's visit Diabetes, Other  General Interventions   General Interventions Discussed/Reviewed General Interventions Discussed, Labs, Durable Medical Equipment (DME), Doctor Visits, Communication with  Doctor Visits Discussed/Reviewed Doctor Visits Discussed, PCP, Specialist  Durable Medical Equipment (DME) Glucomoter  PCP/Specialist Visits Compliance with follow-up visit  Communication with PCP/Specialists  Exercise Interventions   Exercise Discussed/Reviewed Exercise Discussed, Physical Activity, Weight Managment  Physical Activity  Discussed/Reviewed Physical Activity Discussed, Types of exercise, Home Exercise Program (HEP)  Weight Management Weight loss  Education Interventions   Education Provided Provided Web-based Education, Provided Education  Provided Verbal Education On Nutrition, Labs,  Blood Sugar Monitoring, Exercise, Medication, When to see the doctor, [facility], [employer], Other  Labs Reviewed Hgb A1c  Mental Health Interventions   Mental Health Discussed/Reviewed Mental Health Discussed, Coping Strategies  Nutrition Interventions   Nutrition Discussed/Reviewed Nutrition Discussed, Carbohydrate meal planning, Adding fruits and vegetables, Fluid intake, Portion sizes, Decreasing sugar intake, Increasing proteins, Decreasing fats, Decreasing salt, Supplemental nutrition  Pharmacy Interventions   Pharmacy Dicussed/Reviewed Pharmacy Topics Discussed, Medications and their functions, Affording Medications  Advanced Directive Interventions   Advanced Directives Discussed/Reviewed Advanced Directives Discussed  [no peference at this time]       Follow Up Plan: Telephone follow up appointment with care management team member scheduled for:  08-02-2023 at 245 pm          SDOH assessments and interventions completed:  Yes  SDOH Interventions Today    Flowsheet Row Most Recent Value  SDOH Interventions   Food Insecurity Interventions Intervention Not Indicated  Housing Interventions Intervention Not Indicated  Transportation Interventions Intervention Not Indicated  Utilities Interventions Intervention Not Indicated  Financial Strain Interventions Intervention Not Indicated  Stress Interventions Intervention Not Indicated  Health Literacy Interventions Intervention Not Indicated        Care Coordination Interventions:  Yes, provided   Follow up plan: Follow up call scheduled for 08/02/23    Encounter Outcome:  Patient Visit Completed   Cala Bradford L. Noelle Penner, RN, BSN, CCM [place]  [facility], [facility] RN Care Manager Direct Dial: [phone]  Fax: [phone] Mailing Address: [address]  [place] [address] Website: [email or web address]

## 2023-07-05 MED ORDER — FARXIGA 10 MG PO TABS: 10.0000 mg | ORAL_TABLET | Freq: Every day | ORAL | 5 refills | Status: DC

## 2023-07-05 NOTE — Telephone Encounter (Signed)
   Patient [place] Farxiga.  Updated RX escribed to [facility] mail order (pharmacy for [place]).  Patient is stable on current regimen.  He is bringing his income to pcp office.  Route app to PharmD  Kieth Brightly, PharmD, BCACP, CPP Clinical Pharmacist, [facility]

## 2023-07-05 NOTE — Patient Instructions (Addendum)
Visit Information  Thank you for taking time to visit with me today. Please don't hesitate to contact me if I can be of assistance to you.   Following are the goals we discussed today:   Goals Addressed             This Visit's Progress    RNCM care Management  (DIABETES) EXPECTED OUTCOME:  MONITOR, SELF-MANAGE AND REDUCE SYMPTOMS OF DIABETES   Not on track    Current Barriers:  Knowledge Deficits related to Diabetes management Chronic Disease Management support and education needs related to Diabetes, diet No Advanced Directives in place- pt declines information Patient reports CBG is now monitored with [facility], he does not eat a bedtime snack and his last meal is dinner at 530-6 pm Patient reports he does not follow a special diet and eats out daily, does not like a variety of vegetables- open to RN CM sending information to assist with better choices when eating out Lab Results  Component Value Date   HGBA1C 9.7 (H) 06/14/2023     Planned Interventions:    Counseled on importance of regular laboratory monitoring as prescribed. Labs up to date. Review of the goal of A1C of <7.0;         Reviewed effects elevated CBG has on the body including kidneys and eyes Reviewed carbohydrate modified diet. Discussed eating balanced meals and also making sure he eats a bedtime snack so that his blood sugars will not get too low during the night. The patient verbalized understanding. Encouraged pt to eat a bedtime snack including carbohydrate and protein Encouraged pt to eat 3 meals per day around the same time each day Reviewed signs/ symptoms hypoglycemia and interventions  Symptom Management: Take medications as prescribed   Attend all scheduled provider appointments Call pharmacy for medication refills 3-7 days in advance of running out of medications Attend church or other social activities Perform all self care activities independently  Perform IADL's (shopping, preparing  meals, housekeeping, managing finances) independently Call provider office for new concerns or questions  check blood sugar at prescribed times: per doctor's order  check feet daily for cuts, sores or redness enter blood sugar readings and medication or insulin into daily log take the blood sugar log to all doctor visits take the blood sugar meter to all doctor visits trim toenails straight across drink 6 to 8 glasses of water each day fill half of plate with vegetables limit fast food meals to no more than 1 per week manage portion size prepare main meal at home 3 to 5 days each week read food labels for fat, fiber, carbohydrates and portion size set a realistic goal Interventions Today    Flowsheet Row Most Recent Value  Chronic Disease   Chronic disease during today's visit Diabetes, Other  General Interventions   General Interventions Discussed/Reviewed General Interventions Discussed, Labs, Durable Medical Equipment (DME), Doctor Visits, Communication with  Doctor Visits Discussed/Reviewed Doctor Visits Discussed, PCP, Specialist  Durable Medical Equipment (DME) Glucomoter  PCP/Specialist Visits Compliance with follow-up visit  Communication with PCP/Specialists  Exercise Interventions   Exercise Discussed/Reviewed Exercise Discussed, Physical Activity, Weight Managment  Physical Activity Discussed/Reviewed Physical Activity Discussed, Types of exercise, Home Exercise Program (HEP)  Weight Management Weight loss  Education Interventions   Education Provided Provided Web-based Education, Provided Education  Provided Verbal Education On Nutrition, Labs, Blood Sugar Monitoring, Exercise, Medication, When to see the doctor, [employer], [employer], Other  Labs Reviewed Hgb A1c  Mental Health Interventions   Mental Health Discussed/Reviewed Mental Health Discussed, Coping Strategies  Nutrition Interventions   Nutrition Discussed/Reviewed Nutrition Discussed,  Carbohydrate meal planning, Adding fruits and vegetables, Fluid intake, Portion sizes, Decreasing sugar intake, Increasing proteins, Decreasing fats, Decreasing salt, Supplemental nutrition  Pharmacy Interventions   Pharmacy Dicussed/Reviewed Pharmacy Topics Discussed, Medications and their functions, Affording Medications  Advanced Directive Interventions   Advanced Directives Discussed/Reviewed Advanced Directives Discussed  [no peference at this time]       Follow Up Plan: Telephone follow up appointment with care management team member scheduled for:  08-02-2023 at 245 pm          Our next appointment is by telephone on 08/02/23 at 2:45 pm  Please call the care guide team at [phone] if you need to cancel or reschedule your appointment.   If you are experiencing a Mental Health or Behavioral Health Crisis or need someone to talk to, please call the Suicide and Crisis Lifeline: 988 call the [place] National Suicide Prevention Lifeline: 4356675840 or TTY: 986-006-4841 TTY 236-298-8035) to talk to a trained counselor call 1-800-273-TALK (toll free, 24 hour hotline) call the [facility]: (708) [phone] call 911   Patient verbalizes understanding of instructions and care plan provided today and agrees to view in MyChart. Active MyChart status and patient understanding of how to access instructions and care plan via MyChart confirmed with patient.     The patient has been provided with contact information for the care management team and has been advised to call with any health related questions or concerns.   Idalie Canto L. Noelle Penner, RN, BSN, CCM [place]  [facility], [facility] RN Care Manager Direct Dial: [phone]  Fax: [phone] Mailing Address: [address]  [place] [address] Website: [email or web address]

## 2023-07-11 NOTE — Telephone Encounter (Signed)
 Faxed proof of income to [employer]

## 2023-07-12 NOTE — Telephone Encounter (Signed)
Returned call see pharmd note

## 2023-07-15 ENCOUNTER — Other Ambulatory Visit: Payer: Self-pay | Admitting: Nurse Practitioner

## 2023-07-15 ENCOUNTER — Ambulatory Visit (INDEPENDENT_AMBULATORY_CARE_PROVIDER_SITE_OTHER): Payer: Medicare HMO | Admitting: Nurse Practitioner

## 2023-07-15 VITALS — BP 151/80 | HR 95 | Temp 97.6°F | Ht 73.0 in | Wt 194.0 lb

## 2023-07-15 DIAGNOSIS — E785 Hyperlipidemia, unspecified: Secondary | ICD-10-CM

## 2023-07-15 DIAGNOSIS — I10 Essential (primary) hypertension: Secondary | ICD-10-CM

## 2023-07-15 DIAGNOSIS — Z794 Long term (current) use of insulin: Secondary | ICD-10-CM | POA: Diagnosis not present

## 2023-07-15 DIAGNOSIS — E1142 Type 2 diabetes mellitus with diabetic polyneuropathy: Secondary | ICD-10-CM | POA: Diagnosis not present

## 2023-07-15 DIAGNOSIS — E1169 Type 2 diabetes mellitus with other specified complication: Secondary | ICD-10-CM | POA: Diagnosis not present

## 2023-07-15 LAB — BAYER DCA HB A1C WAIVED: HB A1C (BAYER DCA - WAIVED): 9.8 % — ABNORMAL HIGH (ref 4.8–5.6)

## 2023-07-15 LAB — LIPID PANEL

## 2023-07-15 NOTE — Progress Notes (Signed)
 Subjective:    Patient ID: Philip Richardson, male    DOB: [birth year], 68 y.o.   MRN: [id number]   Chief Complaint: diabetes  HPI  We have been having trouble with getting his diabetes under control. He is not compliant with diet at all. He is on tresiba  daily and now is on sliding scale 3x a day. His blood sugars have been running around 120 and below in mornings.  Patient Active Problem List   Diagnosis Date Noted   CKD (chronic kidney disease) stage 5, GFR less than 15 ml/min (HCC) 06/05/2022   Drug-induced myopathy 03/14/2020   Normocytic anemia 10/09/2019   Elevated LFTs    GERD (gastroesophageal reflux disease) 07/16/2019   Hyperkalemia 07/16/2019   Constipation 07/16/2019   Hyperlipidemia associated with type 2 diabetes mellitus (HCC) 02/01/2015   Staghorn kidney stones 12/09/2013   Hypertension 10/13/2013   Diabetes mellitus treated with insulin  and oral medication (HCC) 09/10/2013       Review of Systems  Constitutional:  Negative for diaphoresis.  Eyes:  Negative for pain.  Respiratory:  Negative for shortness of breath.   Cardiovascular:  Negative for chest pain, palpitations and leg swelling.  Gastrointestinal:  Negative for abdominal pain.  Endocrine: Negative for polydipsia.  Skin:  Negative for rash.  Neurological:  Negative for dizziness, weakness and headaches.  Hematological:  Does not bruise/bleed easily.  All other systems reviewed and are negative.      Objective:   Physical Exam Vitals and nursing note reviewed.  Constitutional:      Appearance: Normal appearance. He is well-developed.  Neck:     Thyroid : No thyroid  mass or thyromegaly.     Vascular: No carotid bruit or JVD.     Trachea: Phonation normal.  Cardiovascular:     Rate and Rhythm: Normal rate and regular rhythm.  Pulmonary:     Effort: Pulmonary effort is normal. No respiratory distress.     Breath sounds: Normal breath sounds.  Abdominal:     General: Bowel sounds are normal.      Palpations: Abdomen is soft.     Tenderness: There is no abdominal tenderness.  Musculoskeletal:        General: Normal range of motion.     Cervical back: Normal range of motion and neck supple.  Lymphadenopathy:     Cervical: No cervical adenopathy.  Skin:    General: Skin is warm and dry.  Neurological:     Mental Status: He is alert and oriented to person, place, and time.  Psychiatric:        Behavior: Behavior normal.        Thought Content: Thought content normal.        Judgment: Judgment normal.    BP (!) 151/80   Pulse 95   Temp 97.6 F (36.4 C) (Temporal)   Ht 6\' 1"  (1.854 m)   Wt 194 lb (88 kg)   SpO2 100%   BMI 25.60 kg/m    HGBA1c 9.8%      Assessment & Plan:  Philip Richardson in today with chief complaint of Medical Management of Chronic Issues   1. Type 2 diabetes mellitus with diabetic polyneuropathy, with long-term current use of insulin  (HCC) (Primary) Strict carb counting Make sure do sliding scale 3x a day  Continue tresiba  Follow up in 1 month - Bayer DCA Hb A1c Waived - Microalbumin / creatinine urine ratio  2. Primary hypertension - CBC with Differential/Platelet - CMP14+EGFR  3. Hyperlipidemia associated  with type 2 diabetes mellitus (HCC) - Lipid panel    The above assessment and management plan was discussed with the patient. The patient verbalized understanding of and has agreed to the management plan. Patient is aware to call the clinic if symptoms persist or worsen. Patient is aware when to return to the clinic for a follow-up visit. Patient educated on when it is appropriate to go to the emergency department.   Mary-Margaret Gaylyn Keas, FNP

## 2023-07-16 LAB — CBC WITH DIFFERENTIAL/PLATELET
Basophils Absolute: 0.1 10*3/uL (ref 0.0–0.2)
Basos: 1 %
EOS (ABSOLUTE): 0.1 10*3/uL (ref 0.0–0.4)
Eos: 1 %
Hematocrit: 46.5 % (ref 37.5–51.0)
Hemoglobin: 15.3 g/dL (ref 13.0–17.7)
Immature Grans (Abs): 0 10*3/uL (ref 0.0–0.1)
Immature Granulocytes: 1 %
Lymphocytes Absolute: 1.7 10*3/uL (ref 0.7–3.1)
Lymphs: 21 %
MCH: 27 pg (ref 26.6–33.0)
MCHC: 32.9 g/dL (ref 31.5–35.7)
MCV: 82 fL (ref 79–97)
Monocytes Absolute: 0.6 10*3/uL (ref 0.1–0.9)
Monocytes: 7 %
Neutrophils Absolute: 5.5 10*3/uL (ref 1.4–7.0)
Neutrophils: 69 %
Platelets: 248 10*3/uL (ref 150–450)
RBC: 5.66 x10E6/uL (ref 4.14–5.80)
RDW: 14.3 % (ref 11.6–15.4)
WBC: 8 10*3/uL (ref 3.4–10.8)

## 2023-07-16 LAB — CMP14+EGFR
ALT: 32 IU/L (ref 0–44)
AST: 22 IU/L (ref 0–40)
Albumin: 4.3 g/dL (ref 3.9–4.9)
Alkaline Phosphatase: 127 IU/L — ABNORMAL HIGH (ref 44–121)
BUN/Creatinine Ratio: 13 (ref 10–24)
BUN: 26 mg/dL (ref 8–27)
Bilirubin Total: 0.4 mg/dL (ref 0.0–1.2)
CO2: 20 mmol/L (ref 20–29)
Calcium: 9.5 mg/dL (ref 8.6–10.2)
Chloride: 101 mmol/L (ref 96–106)
Creatinine, Ser: 1.94 mg/dL — ABNORMAL HIGH (ref 0.76–1.27)
Globulin, Total: 2.4 g/dL (ref 1.5–4.5)
Glucose: 62 mg/dL — ABNORMAL LOW (ref 70–99)
Potassium: 4 mmol/L (ref 3.5–5.2)
Sodium: 141 mmol/L (ref 134–144)
Total Protein: 6.7 g/dL (ref 6.0–8.5)
eGFR: 37 mL/min/{1.73_m2} — ABNORMAL LOW (ref >59)

## 2023-07-16 LAB — MICROALBUMIN / CREATININE URINE RATIO
Creatinine, Urine: 52.1 mg/dL
Microalb/Creat Ratio: 716 mg/g{creat} — ABNORMAL HIGH (ref 0–29)
Microalbumin, Urine: 372.9 ug/mL

## 2023-07-16 LAB — LIPID PANEL
Cholesterol, Total: 216 mg/dL — ABNORMAL HIGH (ref 100–199)
HDL: 41 mg/dL (ref >39)
LDL CALC COMMENT:: 5.3 ratio — ABNORMAL HIGH (ref 0.0–5.0)
LDL Chol Calc (NIH): 147 mg/dL — ABNORMAL HIGH (ref 0–99)
Triglycerides: 155 mg/dL — ABNORMAL HIGH (ref 0–149)
VLDL Cholesterol Cal: 28 mg/dL (ref 5–40)

## 2023-08-02 ENCOUNTER — Ambulatory Visit: Payer: Self-pay | Admitting: *Deleted

## 2023-08-02 NOTE — Patient Outreach (Signed)
 Care Coordination   Follow Up Visit Note   09/07/2023 Name: Philip Richardson MRN: [id number] DOB: 20-Jan-1956  Philip Richardson is a 68 y.o. year old male who sees [name], FNP for primary care. I spoke with  Everardo Beals by phone today.  What matters to the patients health and wellness today?  Diabetes Remains a Picky eater Confirms he needs tools to help him with managing his diet as a lifestyle change Foods he enjoys eating includes Pickes - bacon, eggs, pancakes, waffles, Generally does not eat lunch but snacks & at dinner may eat hot dogs, hamburgers   Making gradual improvement with decreasing HgA1c from 9.7 to 9.4 recently   Goals Addressed             This Visit's Progress    RNCM care Management  (DIABETES) EXPECTED OUTCOME:  MONITOR, SELF-MANAGE AND REDUCE SYMPTOMS OF DIABETES       Current Barriers:  Knowledge Deficits related to Diabetes management Chronic Disease Management support and education needs related to Diabetes, diet No Advanced Directives in place- pt declines information Patient reports CBG is now monitored with [facility], he does not eat a bedtime snack and his last meal is dinner at 530-6 pm Patient reports he does not follow a special diet and eats out daily, does not like a variety of vegetables- open to RN CM sending information to assist with better choices when eating out Lab Results  Component Value Date   HGBA1C 9.4 (H) 08/12/2023     Planned Interventions:    Counseled on importance of regular laboratory monitoring as prescribed. Labs up to date. Review of the goal of A1C of <7.0;         Reviewed effects elevated CBG has on the body including kidneys and eyes Reviewed carbohydrate modified diet. Discussed eating balanced meals and also making sure he eats a bedtime snack so that his blood sugars will not get too low during the night. The patient verbalized understanding. Encouraged pt to eat a bedtime snack including carbohydrate  and protein Encouraged pt to eat 3 meals per day around the same time each day with better choices of foods even though he is a picky eater Reviewed signs/ symptoms hypoglycemia and interventions  Symptom Management: Take medications as prescribed   Attend all scheduled provider appointments Call pharmacy for medication refills 3-7 days in advance of running out of medications Attend church or other social activities Perform all self care activities independently  Perform IADL's (shopping, preparing meals, housekeeping, managing finances) independently Call provider office for new concerns or questions  check blood sugar at prescribed times: per doctor's order  check feet daily for cuts, sores or redness enter blood sugar readings and medication or insulin into daily log take the blood sugar log to all doctor visits take the blood sugar meter to all doctor visits trim toenails straight across drink 6 to 8 glasses of water each day fill half of plate with vegetables limit fast food meals to no more than 1 per week manage portion size prepare main meal at home 3 to 5 days each week read food labels for fat, fiber, carbohydrates and portion size set a realistic goal    Follow Up Plan: The patient has been provided with contact information for the care management team and has been advised to call with any health related questions or concerns.  No further follow up required: pending RN CM re assignment  SDOH assessments and interventions completed:  No     Care Coordination Interventions:  Yes, provided   Follow up plan: No further intervention required. Re assigned RN CM    Encounter Outcome:  Patient Visit Completed   Cala Bradford L. Noelle Penner, RN, BSN, CCM [place]  [facility], [facility] RN Care Manager Direct Dial: [phone]  Fax: [phone]

## 2023-08-12 ENCOUNTER — Other Ambulatory Visit: Payer: Self-pay

## 2023-08-12 ENCOUNTER — Ambulatory Visit (INDEPENDENT_AMBULATORY_CARE_PROVIDER_SITE_OTHER): Payer: Medicare HMO | Admitting: Nurse Practitioner

## 2023-08-12 ENCOUNTER — Encounter: Payer: Self-pay | Admitting: Nurse Practitioner

## 2023-08-12 VITALS — BP 150/74 | HR 54 | Temp 97.4°F | Ht 73.0 in | Wt 200.0 lb

## 2023-08-12 DIAGNOSIS — Z794 Long term (current) use of insulin: Secondary | ICD-10-CM | POA: Diagnosis not present

## 2023-08-12 DIAGNOSIS — E1142 Type 2 diabetes mellitus with diabetic polyneuropathy: Secondary | ICD-10-CM

## 2023-08-12 LAB — BAYER DCA HB A1C WAIVED: HB A1C (BAYER DCA - WAIVED): 9.4 % — ABNORMAL HIGH (ref 4.8–5.6)

## 2023-08-12 MED ORDER — TRESIBA FLEXTOUCH 200 UNIT/ML ~~LOC~~ SOPN: 110.0000 [IU] | PEN_INJECTOR | Freq: Every day | SUBCUTANEOUS | 5 refills | Status: DC

## 2023-08-12 MED ORDER — INSULIN LISPRO 100 UNIT/ML IJ SOLN: INTRAMUSCULAR | 11 refills | Status: DC

## 2023-08-12 MED ORDER — HUMALOG JUNIOR KWIKPEN 100 UNIT/ML ~~LOC~~ SOPN: PEN_INJECTOR | SUBCUTANEOUS | 3 refills | Status: DC

## 2023-08-12 NOTE — Patient Instructions (Signed)
 Sliding scale adjustment If blood sugar is 100-150-4u; 151-200-6u 201-250-8u 251-300-10u >301-12u at breakfast, lunch and supper.

## 2023-08-12 NOTE — Progress Notes (Signed)
   Subjective:    Patient ID: Philip Richardson, male    DOB: 02-18-1956, 68 y.o.   MRN: [id number]   Chief Complaint: diabetes  HPI  Patient in today for diabetes follow up. We have been struggling to get his diabetes under control. Patient is not compliant with diet at all. His lst hgba1c was 9.8%. at last visit we discussed doing sliding scale 3x a day.   Patient Active Problem List   Diagnosis Date Noted   CKD (chronic kidney disease) stage 5, GFR less than 15 ml/min (HCC) 06/05/2022   Drug-induced myopathy 03/14/2020   Normocytic anemia 10/09/2019   Elevated LFTs    GERD (gastroesophageal reflux disease) 07/16/2019   Hyperkalemia 07/16/2019   Constipation 07/16/2019   Hyperlipidemia associated with type 2 diabetes mellitus (HCC) 02/01/2015   Staghorn kidney stones 12/09/2013   Hypertension 10/13/2013   Diabetes mellitus treated with insulin and oral medication (HCC) 09/10/2013        Review of Systems  Constitutional:  Negative for diaphoresis.  Eyes:  Negative for pain.  Respiratory:  Negative for shortness of breath.   Cardiovascular:  Negative for chest pain, palpitations and leg swelling.  Gastrointestinal:  Negative for abdominal pain.  Endocrine: Negative for polydipsia.  Skin:  Negative for rash.  Neurological:  Negative for dizziness, weakness and headaches.  Hematological:  Does not bruise/bleed easily.  All other systems reviewed and are negative.      Objective:   Physical Exam Constitutional:      Appearance: Normal appearance.  Cardiovascular:     Rate and Rhythm: Normal rate and regular rhythm.     Heart sounds: Normal heart sounds.  Pulmonary:     Breath sounds: Normal breath sounds.  Skin:    General: Skin is warm.  Neurological:     General: No focal deficit present.     Mental Status: He is alert and oriented to person, place, and time.  Psychiatric:        Mood and Affect: Mood normal.        Behavior: Behavior normal.    BP (!)  150/74   Pulse (!) 54   Temp (!) 97.4 F (36.3 C) (Temporal)   Ht 6\' 1"  (1.854 m)   Wt 200 lb (90.7 kg)   SpO2 98%   BMI 26.39 kg/m   HGBa1c 9.4%       Assessment & Plan:   Philip Richardson in today with chief complaint of Diabetes   1. Type 2 diabetes mellitus with diabetic polyneuropathy, with long-term current use of insulin (HCC) (Primary) Increase tresiba to 106u for 3 day then 11u daiy Increase sliding scale by 2 u per readings Recheck in 2 months - Bayer DCA Hb A1c Waived    The above assessment and management plan was discussed with the patient. The patient verbalized understanding of and has agreed to the management plan. Patient is aware to call the clinic if symptoms persist or worsen. Patient is aware when to return to the clinic for a follow-up visit. Patient educated on when it is appropriate to go to the emergency department.   Mary-Margaret Daphine Deutscher, FNP

## 2023-08-15 ENCOUNTER — Other Ambulatory Visit: Payer: Self-pay | Admitting: Nurse Practitioner

## 2023-08-15 DIAGNOSIS — I10 Essential (primary) hypertension: Secondary | ICD-10-CM

## 2023-08-29 ENCOUNTER — Ambulatory Visit: Payer: Self-pay | Admitting: *Deleted

## 2023-08-29 ENCOUNTER — Ambulatory Visit: Payer: Self-pay | Admitting: Nurse Practitioner

## 2023-08-29 NOTE — Telephone Encounter (Signed)
 duplicate

## 2023-08-29 NOTE — Telephone Encounter (Signed)
 Copied from CRM [id number]. Topic: Clinical - Lab/Test Results >> Aug 29, 2023  1:14 PM Philip Richardson wrote: Reason for CRM: Patient has further questions about his labs  Pt and agent unable to hear me when agent tried to transfer the call over.

## 2023-08-29 NOTE — Telephone Encounter (Signed)
 Pt states he was speaking with someone earlier but does not remember her name. Pt states he "has answers for her." Pt states the following:  He is getting labs drawn on Monday. Pt states the paperwork he got does not have [place] on it. Pt spoke with Annice Pih at [facility] and got it straightened out for him.   Will route to office.   Copied from CRM [id number]. Topic: Clinical - Lab/Test Results >> Aug 29, 2023  1:14 PM Higinio Roger wrote: Reason for CRM: Patient has further questions about his labs Reason for Disposition  Health Information question, no triage required and triager able to answer question  Answer Assessment - Initial Assessment Questions 1. REASON FOR CALL or QUESTION: "What is your reason for calling today?" or "How can I best help you?" or "What question do you have that I can help answer?"     Information  Protocols used: Information Only Call - No Triage-A-AH

## 2023-09-02 ENCOUNTER — Other Ambulatory Visit

## 2023-09-02 DIAGNOSIS — E1129 Type 2 diabetes mellitus with other diabetic kidney complication: Secondary | ICD-10-CM | POA: Diagnosis not present

## 2023-09-02 DIAGNOSIS — R809 Proteinuria, unspecified: Secondary | ICD-10-CM | POA: Diagnosis not present

## 2023-09-02 DIAGNOSIS — I10 Essential (primary) hypertension: Secondary | ICD-10-CM | POA: Diagnosis not present

## 2023-09-02 DIAGNOSIS — I129 Hypertensive chronic kidney disease with stage 1 through stage 4 chronic kidney disease, or unspecified chronic kidney disease: Secondary | ICD-10-CM | POA: Diagnosis not present

## 2023-09-02 DIAGNOSIS — N189 Chronic kidney disease, unspecified: Secondary | ICD-10-CM | POA: Diagnosis not present

## 2023-09-02 DIAGNOSIS — Q631 Lobulated, fused and horseshoe kidney: Secondary | ICD-10-CM | POA: Diagnosis not present

## 2023-09-02 DIAGNOSIS — E1122 Type 2 diabetes mellitus with diabetic chronic kidney disease: Secondary | ICD-10-CM | POA: Diagnosis not present

## 2023-09-02 DIAGNOSIS — N1832 Chronic kidney disease, stage 3b: Secondary | ICD-10-CM | POA: Diagnosis not present

## 2023-09-02 DIAGNOSIS — N200 Calculus of kidney: Secondary | ICD-10-CM | POA: Diagnosis not present

## 2023-09-07 NOTE — Patient Instructions (Signed)
 Visit Information  Thank you for taking time to visit with me today. Please don't hesitate to contact me if I can be of assistance to you.   Following are the goals we discussed today:   Goals Addressed             This Visit's Progress    RNCM care Management  (DIABETES) EXPECTED OUTCOME:  MONITOR, SELF-MANAGE AND REDUCE SYMPTOMS OF DIABETES       Current Barriers:  Knowledge Deficits related to Diabetes management Chronic Disease Management support and education needs related to Diabetes, diet No Advanced Directives in place- pt declines information Patient reports CBG is now monitored with [facility], he does not eat a bedtime snack and his last meal is dinner at 530-6 pm Patient reports he does not follow a special diet and eats out daily, does not like a variety of vegetables- open to RN CM sending information to assist with better choices when eating out Lab Results  Component Value Date   HGBA1C 9.4 (H) 08/12/2023     Planned Interventions:    Counseled on importance of regular laboratory monitoring as prescribed. Labs up to date. Review of the goal of A1C of <7.0;         Reviewed effects elevated CBG has on the body including kidneys and eyes Reviewed carbohydrate modified diet. Discussed eating balanced meals and also making sure he eats a bedtime snack so that his blood sugars will not get too low during the night. The patient verbalized understanding. Encouraged pt to eat a bedtime snack including carbohydrate and protein Encouraged pt to eat 3 meals per day around the same time each day with better choices of foods even though he is a picky eater Reviewed signs/ symptoms hypoglycemia and interventions  Symptom Management: Take medications as prescribed   Attend all scheduled provider appointments Call pharmacy for medication refills 3-7 days in advance of running out of medications Attend church or other social activities Perform all self care activities  independently  Perform IADL's (shopping, preparing meals, housekeeping, managing finances) independently Call provider office for new concerns or questions  check blood sugar at prescribed times: per doctor's order  check feet daily for cuts, sores or redness enter blood sugar readings and medication or insulin into daily log take the blood sugar log to all doctor visits take the blood sugar meter to all doctor visits trim toenails straight across drink 6 to 8 glasses of water each day fill half of plate with vegetables limit fast food meals to no more than 1 per week manage portion size prepare main meal at home 3 to 5 days each week read food labels for fat, fiber, carbohydrates and portion size set a realistic goal    Follow Up Plan: The patient has been provided with contact information for the care management team and has been advised to call with any health related questions or concerns.  No further follow up required: pending RN CM re assignment             Our next appointment is no further scheduled appointments.  on pending RN CM re assignment at ?  Please call the care guide team at [phone] if you need to cancel or reschedule your appointment.   If you are experiencing a Mental Health or Behavioral Health Crisis or need someone to talk to, please call the Suicide and Crisis Lifeline: 988 call the [place] National Suicide Prevention Lifeline: 386-223-3115 or TTY: 925-046-8526 TTY 415-618-6686)  to talk to a trained counselor call 1-800-273-TALK (toll free, 24 hour hotline) call the [facility]: [phone] call 911   Patient verbalizes understanding of instructions and care plan provided today and agrees to view in MyChart. Active MyChart status and patient understanding of how to access instructions and care plan via MyChart confirmed with patient.     The patient has been provided with contact information for the care management team and has  been advised to call with any health related questions or concerns.    Philip Richardson L. Noelle Penner, RN, BSN, CCM [place]  [facility], [facility] RN Care Manager Direct Dial: [phone]  Fax: [phone]

## 2023-09-10 DIAGNOSIS — I10 Essential (primary) hypertension: Secondary | ICD-10-CM | POA: Diagnosis not present

## 2023-09-10 DIAGNOSIS — E1122 Type 2 diabetes mellitus with diabetic chronic kidney disease: Secondary | ICD-10-CM | POA: Diagnosis not present

## 2023-09-10 DIAGNOSIS — N1832 Chronic kidney disease, stage 3b: Secondary | ICD-10-CM | POA: Diagnosis not present

## 2023-09-10 DIAGNOSIS — R809 Proteinuria, unspecified: Secondary | ICD-10-CM | POA: Diagnosis not present

## 2023-09-16 ENCOUNTER — Telehealth: Payer: Self-pay | Admitting: Nurse Practitioner

## 2023-09-20 ENCOUNTER — Other Ambulatory Visit: Payer: Self-pay | Admitting: Nurse Practitioner

## 2023-09-20 DIAGNOSIS — I10 Essential (primary) hypertension: Secondary | ICD-10-CM

## 2023-09-20 NOTE — Telephone Encounter (Signed)
 Application withdrawn by company 08/09/23.

## 2023-09-23 ENCOUNTER — Telehealth: Payer: Self-pay | Admitting: Family Medicine

## 2023-09-23 NOTE — Telephone Encounter (Signed)
 Copied from CRM [id number]. Topic: General - Call Back - No Documentation >> Sep 20, 2023  1:20 PM Bearl Botts E wrote: Reason for CRM: Glynis Lass. From [employer] called to see if fax was received from the 04/7 and 04/14. Its regarding statin therapy.  Best contact: [phone]

## 2023-10-08 ENCOUNTER — Other Ambulatory Visit: Payer: Self-pay | Admitting: Nurse Practitioner

## 2023-10-08 DIAGNOSIS — E119 Type 2 diabetes mellitus without complications: Secondary | ICD-10-CM

## 2023-10-14 ENCOUNTER — Ambulatory Visit (INDEPENDENT_AMBULATORY_CARE_PROVIDER_SITE_OTHER): Admitting: Nurse Practitioner

## 2023-10-14 ENCOUNTER — Encounter: Payer: Self-pay | Admitting: Nurse Practitioner

## 2023-10-14 VITALS — BP 148/78 | HR 58 | Temp 97.6°F | Ht 73.0 in | Wt 210.0 lb

## 2023-10-14 DIAGNOSIS — K219 Gastro-esophageal reflux disease without esophagitis: Secondary | ICD-10-CM

## 2023-10-14 DIAGNOSIS — Z7984 Long term (current) use of oral hypoglycemic drugs: Secondary | ICD-10-CM

## 2023-10-14 DIAGNOSIS — E1169 Type 2 diabetes mellitus with other specified complication: Secondary | ICD-10-CM

## 2023-10-14 DIAGNOSIS — E1122 Type 2 diabetes mellitus with diabetic chronic kidney disease: Secondary | ICD-10-CM | POA: Diagnosis not present

## 2023-10-14 DIAGNOSIS — N185 Chronic kidney disease, stage 5: Secondary | ICD-10-CM

## 2023-10-14 DIAGNOSIS — I10 Essential (primary) hypertension: Secondary | ICD-10-CM | POA: Diagnosis not present

## 2023-10-14 DIAGNOSIS — E119 Type 2 diabetes mellitus without complications: Secondary | ICD-10-CM

## 2023-10-14 DIAGNOSIS — Z794 Long term (current) use of insulin: Secondary | ICD-10-CM

## 2023-10-14 DIAGNOSIS — E785 Hyperlipidemia, unspecified: Secondary | ICD-10-CM | POA: Diagnosis not present

## 2023-10-14 DIAGNOSIS — E1142 Type 2 diabetes mellitus with diabetic polyneuropathy: Secondary | ICD-10-CM | POA: Diagnosis not present

## 2023-10-14 LAB — BAYER DCA HB A1C WAIVED: HB A1C (BAYER DCA - WAIVED): 9.6 % — ABNORMAL HIGH (ref 4.8–5.6)

## 2023-10-14 MED ORDER — GLIMEPIRIDE 4 MG PO TABS: 4.0000 mg | ORAL_TABLET | Freq: Every day | ORAL | 1 refills | Status: DC

## 2023-10-14 MED ORDER — CLONIDINE HCL 0.1 MG PO TABS: 0.1000 mg | ORAL_TABLET | Freq: Three times a day (TID) | ORAL | 1 refills | Status: DC

## 2023-10-14 MED ORDER — AMLODIPINE BESYLATE 10 MG PO TABS: 10.0000 mg | ORAL_TABLET | Freq: Every day | ORAL | 1 refills | Status: DC

## 2023-10-14 MED ORDER — PANTOPRAZOLE SODIUM 40 MG PO TBEC: 40.0000 mg | DELAYED_RELEASE_TABLET | Freq: Two times a day (BID) | ORAL | 1 refills | Status: DC

## 2023-10-14 NOTE — Progress Notes (Addendum)
 He   Subjective:    Patient ID: Philip Richardson, male    DOB: 04-Aug-1955, 68 y.o.   MRN: [id number]   Chief Complaint: medical management of chronic issues     HPI:  Philip Richardson is a 68 y.o. who identifies as a male who was assigned male at birth.   Social history: Lives with: by himself Work history: works part time  at a [employer] account executive in today for follow up of the following chronic medical issues:  1. Primary hypertension No c/o chest pain, sob or headache. Does not check blood pressure at home BP Readings from Last 3 Encounters:  08/12/23 (!) 150/74  07/15/23 (!) 151/80  06/14/23 (!) 160/80     2. Hyperlipidemia associated with type 2 diabetes mellitus (HCC) Does not really watch diet and does no dedicated exercise. Statins cause myalgia- refuses repatha  Lab Results  Component Value Date   CHOL 216 (H) 07/15/2023   HDL 41 07/15/2023   LDLCALC 147 (H) 07/15/2023   LDLDIRECT 113 (H) 04/08/2018   TRIG 155 (H) 07/15/2023   CHOLHDL 5.3 (H) 07/15/2023      3. Diabetes mellitus treated with insulin  and oral medication [facility]) Patient has been coming in monthly for diabetes check. He ran out of farxiga  in January and patient assistant has not sent him any. His hgba1c was 9.4%. we increased his tresiba  to 111 and increased his sliding scale by 2u at each level. His blood sugars have been running 60-160 fasting Lab Results  Component Value Date   HGBA1C 9.4 (H) 08/12/2023     4. Hyperkalemia No c/o muscle cramps  5. Gastroesophageal reflux disease, unspecified whether esophagitis present Is on protonix  daily an dis doing well  6. CKD (chronic kidney disease) stage 5, GFR less than 15 ml/min (HCC) No voiding issues Lab Results  Component Value Date   CREATININE 1.94 (H) 07/15/2023     7. Normocytic anemia No c/o fatigue Lab Results  Component Value Date   HGB 15.3 07/15/2023    Wt Readings from Last 3 Encounters:  10/14/23 210 lb (95.3 kg)   08/12/23 200 lb (90.7 kg)  07/15/23 194 lb (88 kg)   BMI Readings from Last 3 Encounters:  10/14/23 27.71 kg/m  08/12/23 26.39 kg/m  07/15/23 25.60 kg/m     New complaints: None today  Allergies  Allergen Reactions   Atorvastatin      Myopathy/weakness   Invokana  [Canagliflozin ] Other (See Comments)    weakness   Semaglutide  Other (See Comments)    Heartburn   Outpatient Encounter Medications as of 10/14/2023  Medication Sig   acetaminophen  (TYLENOL ) 325 MG tablet Take 325-650 mg by mouth every 6 (six) hours as needed (for pain.).   amLODipine  (NORVASC ) 10 MG tablet Take 1 tablet (10 mg total) by mouth daily.   Blood Glucose Monitoring Suppl (ONETOUCH VERIO FLEX SYSTEM) w/Device KIT Use to test blood sugar twice daily. DX E11.9   cloNIDine  (CATAPRES ) 0.1 MG tablet TAKE ONE TABLET THREE TIMES DAILY   Continuous Blood Gluc Receiver (FREESTYLE LIBRE 2 READER) DEVI 1 each by Does not apply route daily.   Continuous Glucose Sensor (FREESTYLE LIBRE 2 SENSOR) MISC 1 each by Does not apply route every 14 (fourteen) days. DX: E11.65, Z79.4   FARXIGA  10 MG TABS tablet Take 1 tablet (10 mg total) by mouth daily before breakfast.   ferrous sulfate  325 (65 FE) MG tablet Take 1 tablet (325 mg total) by mouth daily with  breakfast.   fluticasone  (FLONASE ) 50 MCG/ACT nasal spray Place 2 sprays into both nostrils daily.   glimepiride  (AMARYL ) 4 MG tablet TAKE 1 TABLET DAILY BEFORE BREAKFAST   glucose blood (ONETOUCH VERIO) test strip Use to test blood sugar twice daily. DX E11.9   insulin  degludec (TRESIBA  FLEXTOUCH) 200 UNIT/ML FlexTouch Pen Inject 110 Units into the skin daily.   insulin  lispro (HUMALOG ) 100 UNIT/ML injection If blood sugar is 100-150-2u;151-200-4u;201-250-6u;251-300-8u;>301-10u at lunch and supper.   Insulin  Pen Needle (PEN NEEDLES 31GX5/16") 31G X 8 MM MISC Use to inject insulin  daily as prescribed   OneTouch Delica Lancets 30G MISC Use to test blood sugar twice daily. DX  E11.9   pantoprazole  (PROTONIX ) 40 MG tablet Take 1 tablet (40 mg total) by mouth 2 (two) times daily.   No facility-administered encounter medications on file as of 10/14/2023.    Past Surgical History:  Procedure Laterality Date   BIOPSY  04/25/2020   Procedure: BIOPSY;  Surgeon: Vinetta Greening, DO;  [facility];  Service: Endoscopy;;  duodenum gastric esophagus   COLONOSCOPY WITH PROPOFOL  N/A 04/25/2020   internal hemorrhoids, one 5 mm polyp in descending colon. Tubular adenoma. 5 year surveillance.   CYSTOSCOPY W/ URETERAL STENT PLACEMENT Bilateral 12/09/2013   Procedure: CYSTOSCOPY WITH RETROGRADE PYELOGRAM/URETERAL STENT PLACEMENT;  Surgeon: Osborn Blaze, MD;  Location: [facility];  Service: Urology;  Laterality: Bilateral;   CYSTOSCOPY WITH RETROGRADE PYELOGRAM, URETEROSCOPY AND STENT PLACEMENT Bilateral 09/30/2013   Procedure: CYSTOSCOPY WITH BILATERAL RETROGRADE PYELOGRAM, LEFT DIAGNOSTIC URETEROSCOPY AND Left ureteral stent;  Surgeon: Osborn Blaze, MD;  Location: [facility];  Service: Urology;  Laterality: Bilateral;   ESOPHAGOGASTRODUODENOSCOPY (EGD) WITH PROPOFOL  N/A 04/25/2020   Mildly severe candida esophagitis without bleed, s/p biopsy. Suspicion for eosinophilic esophagitis but no increased eosinophils. Gastritis. Reactive gastropathy. Negative H.pylori.  +KOH prep.    PERCUTANEOUS NEPHROLITHOTRIPSY  2005   POLYPECTOMY  04/25/2020   Procedure: POLYPECTOMY;  Surgeon: Vinetta Greening, DO;  [facility];  Service: Endoscopy;;  colon   ROBOT ASSISTED PYELOPLASTY N/A 12/09/2013   Procedure: ROBOTIC ASSISTED BILATERAL PYELOLITHOTOMY, RIGHT  PYELOPLASTY ;  Surgeon: Osborn Blaze, MD;  Location: [facility];  Service: Urology;  Laterality: N/A;    Family History  Problem Relation Age of Onset   Cancer Mother    Colon cancer Neg Hx    Pancreatitis Neg Hx       Controlled substance contract: n/a     Review of Systems  Constitutional:   Negative for diaphoresis.  Eyes:  Negative for pain.  Respiratory:  Negative for shortness of breath.   Cardiovascular:  Negative for chest pain, palpitations and leg swelling.  Gastrointestinal:  Negative for abdominal pain.  Endocrine: Negative for polydipsia.  Skin:  Negative for rash.  Neurological:  Negative for dizziness, weakness and headaches.  Hematological:  Does not bruise/bleed easily.  All other systems reviewed and are negative.      Objective:   Physical Exam Vitals and nursing note reviewed.  Constitutional:      Appearance: Normal appearance. He is well-developed.  HENT:     Head: Normocephalic.     Nose: Nose normal.     Mouth/Throat:     Mouth: Mucous membranes are moist.     Pharynx: Oropharynx is clear.  Eyes:     Pupils: Pupils are equal, round, and reactive to light.  Neck:     Thyroid : No thyroid  mass or thyromegaly.     Vascular:  No carotid bruit or JVD.     Trachea: Phonation normal.  Cardiovascular:     Rate and Rhythm: Normal rate and regular rhythm.  Pulmonary:     Effort: Pulmonary effort is normal. No respiratory distress.     Breath sounds: Normal breath sounds.  Abdominal:     General: Bowel sounds are normal.     Palpations: Abdomen is soft.     Tenderness: There is no abdominal tenderness.  Musculoskeletal:        General: Normal range of motion.     Cervical back: Normal range of motion and neck supple.  Lymphadenopathy:     Cervical: No cervical adenopathy.  Skin:    General: Skin is warm and dry.  Neurological:     Mental Status: He is alert and oriented to person, place, and time.  Psychiatric:        Behavior: Behavior normal.        Thought Content: Thought content normal.        Judgment: Judgment normal.     BP (!) 148/78   Pulse (!) 58   Temp 97.6 F (36.4 C) (Temporal)   Ht 6\' 1"  (1.854 m)   Wt 210 lb (95.3 kg)   SpO2 98%   BMI 27.71 kg/m   HGBA1c 9.6%       Assessment & Plan:  Philip Richardson comes in  today with chief complaint of medical management of chronic issues    Diagnosis and orders addressed:  1. Primary hypertension Low sodium diet  2. Hyperlipidemia associated with type 2 diabetes mellitus (HCC) Low fat diet  3. Diabetes mellitus treated with insulin  and oral medication (HCC) continue tresiba   to 11u daily continue amaryl  to 8mg  daily Continue current sliding scale Farxiga  sample given - Bayer DCA Hb A1c Waived - insulin  degludec (TRESIBA  FLEXTOUCH) 200 UNIT/ML FlexTouch Pen; Inject 90 Units into the skin daily.  Dispense: 15 mL; Refill: 5  4. Hyperkalemia Not time for labs  5. Gastroesophageal reflux disease, unspecified whether esophagitis present Avoid spicy foods Do not eat 2 hours prior to bedtime   6. CKD (chronic kidney disease) stage 5, GFR less than 15 ml/min (HCC) Not time for labs  7. Normocytic anemia Not time for labs  abs pending Health Maintenance reviewed Diet and exercise encouraged  Follow up plan: 1 months   Mary-Margaret Gaylyn Keas, FNP

## 2023-10-15 ENCOUNTER — Ambulatory Visit: Payer: Self-pay | Admitting: Nurse Practitioner

## 2023-10-15 LAB — CMP14+EGFR
ALT: 24 IU/L (ref 0–44)
AST: 17 IU/L (ref 0–40)
Albumin: 3.7 g/dL — ABNORMAL LOW (ref 3.9–4.9)
Alkaline Phosphatase: 115 IU/L (ref 44–121)
BUN/Creatinine Ratio: 8 — ABNORMAL LOW (ref 10–24)
BUN: 17 mg/dL (ref 8–27)
Bilirubin Total: 0.3 mg/dL (ref 0.0–1.2)
CO2: 23 mmol/L (ref 20–29)
Calcium: 8.8 mg/dL (ref 8.6–10.2)
Chloride: 100 mmol/L (ref 96–106)
Creatinine, Ser: 2.08 mg/dL — ABNORMAL HIGH (ref 0.76–1.27)
Globulin, Total: 2 g/dL (ref 1.5–4.5)
Glucose: 193 mg/dL — ABNORMAL HIGH (ref 70–99)
Potassium: 4.7 mmol/L (ref 3.5–5.2)
Sodium: 139 mmol/L (ref 134–144)
Total Protein: 5.7 g/dL — ABNORMAL LOW (ref 6.0–8.5)
eGFR: 34 mL/min/{1.73_m2} — ABNORMAL LOW (ref >59)

## 2023-10-15 LAB — CBC WITH DIFFERENTIAL/PLATELET
Basophils Absolute: 0.1 10*3/uL (ref 0.0–0.2)
Basos: 1 %
EOS (ABSOLUTE): 0.1 10*3/uL (ref 0.0–0.4)
Eos: 2 %
Hematocrit: 41.8 % (ref 37.5–51.0)
Hemoglobin: 13.6 g/dL (ref 13.0–17.7)
Immature Grans (Abs): 0 10*3/uL (ref 0.0–0.1)
Immature Granulocytes: 0 %
Lymphocytes Absolute: 1.3 10*3/uL (ref 0.7–3.1)
Lymphs: 26 %
MCH: 27.6 pg (ref 26.6–33.0)
MCHC: 32.5 g/dL (ref 31.5–35.7)
MCV: 85 fL (ref 79–97)
Monocytes Absolute: 0.4 10*3/uL (ref 0.1–0.9)
Monocytes: 8 %
Neutrophils Absolute: 3.1 10*3/uL (ref 1.4–7.0)
Neutrophils: 63 %
Platelets: 180 10*3/uL (ref 150–450)
RBC: 4.92 x10E6/uL (ref 4.14–5.80)
RDW: 13.9 % (ref 11.6–15.4)
WBC: 4.9 10*3/uL (ref 3.4–10.8)

## 2023-10-15 LAB — LIPID PANEL
Chol/HDL Ratio: 5.5 ratio — ABNORMAL HIGH (ref 0.0–5.0)
Cholesterol, Total: 197 mg/dL (ref 100–199)
HDL: 36 mg/dL — ABNORMAL LOW (ref >39)
LDL Chol Calc (NIH): 129 mg/dL — ABNORMAL HIGH (ref 0–99)
Triglycerides: 179 mg/dL — ABNORMAL HIGH (ref 0–149)
VLDL Cholesterol Cal: 32 mg/dL (ref 5–40)

## 2023-11-06 ENCOUNTER — Telehealth: Payer: Self-pay | Admitting: Nurse Practitioner

## 2023-11-07 ENCOUNTER — Telehealth: Payer: Self-pay | Admitting: Nurse Practitioner

## 2023-11-07 DIAGNOSIS — H04123 Dry eye syndrome of bilateral lacrimal glands: Secondary | ICD-10-CM | POA: Diagnosis not present

## 2023-11-07 NOTE — Telephone Encounter (Signed)
 Copied from CRM [id number]. Topic: General - Other >> Nov 07, 2023  3:55 PM [place] B wrote: Reason for CRM: pt called in states he is having a driving test with his Employer and they are going to send over a form about his diabetes

## 2023-11-11 ENCOUNTER — Encounter: Payer: Self-pay | Admitting: Nurse Practitioner

## 2023-11-11 ENCOUNTER — Ambulatory Visit (INDEPENDENT_AMBULATORY_CARE_PROVIDER_SITE_OTHER): Admitting: Nurse Practitioner

## 2023-11-11 ENCOUNTER — Ambulatory Visit: Payer: Self-pay | Admitting: Nurse Practitioner

## 2023-11-11 VITALS — BP 135/68 | HR 61 | Temp 97.2°F | Ht 73.0 in | Wt 203.0 lb

## 2023-11-11 DIAGNOSIS — Z794 Long term (current) use of insulin: Secondary | ICD-10-CM

## 2023-11-11 DIAGNOSIS — E1142 Type 2 diabetes mellitus with diabetic polyneuropathy: Secondary | ICD-10-CM | POA: Diagnosis not present

## 2023-11-11 LAB — BAYER DCA HB A1C WAIVED: HB A1C (BAYER DCA - WAIVED): 9.9 % — ABNORMAL HIGH (ref 4.8–5.6)

## 2023-11-11 NOTE — Progress Notes (Signed)
   Subjective:    Patient ID: Philip Richardson, male    DOB: August 13, 1955, 68 y.o.   MRN: [id number]   Chief Complaint: diabetes  HPI  Patient in today for diabetes follow up. We have been struggling to get his diabetes under control. Patient is not compliant with diet at all. His last hgba1c was 9.6%. at last visit we discussed doing sliding scale 3x a day. A continue tresiba   to 11u daily continue amaryl  to 8mg  daily Continue current sliding scale Farxiga  sample given at  last visit:  Since changes his blood sugars are still all over the place.  Patient Active Problem List   Diagnosis Date Noted   CKD (chronic kidney disease) stage 5, GFR less than 15 ml/min (HCC) 06/05/2022   Drug-induced myopathy 03/14/2020   Normocytic anemia 10/09/2019   Elevated LFTs    GERD (gastroesophageal reflux disease) 07/16/2019   Hyperkalemia 07/16/2019   Constipation 07/16/2019   Hyperlipidemia associated with type 2 diabetes mellitus (HCC) 02/01/2015   Staghorn kidney stones 12/09/2013   Hypertension 10/13/2013   Diabetes mellitus treated with insulin  and oral medication (HCC) 09/10/2013        Review of Systems  Constitutional:  Negative for diaphoresis.  Eyes:  Negative for pain.  Respiratory:  Negative for shortness of breath.   Cardiovascular:  Negative for chest pain, palpitations and leg swelling.  Gastrointestinal:  Negative for abdominal pain.  Endocrine: Negative for polydipsia.  Skin:  Negative for rash.  Neurological:  Negative for dizziness, weakness and headaches.  Hematological:  Does not bruise/bleed easily.  All other systems reviewed and are negative.      Objective:   Physical Exam Constitutional:      Appearance: Normal appearance.  Cardiovascular:     Rate and Rhythm: Normal rate and regular rhythm.     Heart sounds: Normal heart sounds.  Pulmonary:     Breath sounds: Normal breath sounds.  Skin:    General: Skin is warm.  Neurological:     General: No focal  deficit present.     Mental Status: He is alert and oriented to person, place, and time.  Psychiatric:        Mood and Affect: Mood normal.        Behavior: Behavior normal.    BP 135/68   Pulse 61   Temp (!) 97.2 F (36.2 C) (Temporal)   Ht 6\' 1"  (1.854 m)   Wt 203 lb (92.1 kg)   SpO2 100%   BMI 26.78 kg/m    HGBa1c 9.9%       Assessment & Plan:   Philip Richardson in today with chief complaint of DIabetes  1. Type 2 diabetes mellitus with diabetic polyneuropathy, with long-term current use of insulin  (HCC) (Primary) Increase tresiba  to 106u for 3 day then 11u daiy Increase sliding scale by 2 u per readings Needs referral endocrinology - Bayer DCA Hb A1c Waived    The above assessment and management plan was discussed with the patient. The patient verbalized understanding of and has agreed to the management plan. Patient is aware to call the clinic if symptoms persist or worsen. Patient is aware when to return to the clinic for a follow-up visit. Patient educated on when it is appropriate to go to the emergency department.   Mary-Margaret Gaylyn Keas, FNP

## 2023-11-14 ENCOUNTER — Telehealth: Payer: Self-pay

## 2023-11-14 NOTE — Telephone Encounter (Signed)
 Patient came in for regular follow up. States that he still has not heard back about medication assistance for Farxiga . Looks like there was a call in January and info was sent a little later to company and company cancelled request on 08/09/23. He is unable to pay for this med. Wants to know if paperwork can be resubmitted or what he needs to do. I gave samples. Please contact patient

## 2023-11-18 ENCOUNTER — Telehealth: Payer: Self-pay

## 2023-11-18 NOTE — Progress Notes (Unsigned)
 Pharmacy Medication Assistance Program Note    11/22/2023  Patient ID: Philip Richardson, male   DOB: 09/06/1955, 68 y.o.   MRN: [id number]     11/18/2023  Outreach Medication One  Manufacturer Medication One [employer]  [employer] Farxiga   Dose of Farxiga  10MG   Type of Radiographer, therapeutic Assistance  Date Application Sent to Prescriber 11/18/2023  Date Application Received From Provider 11/21/2023  Date Application Submitted to Manufacturer 11/22/2023  Method Application Sent to Manufacturer Fax     RENEWAL - submitted

## 2023-11-21 ENCOUNTER — Telehealth: Payer: Self-pay | Admitting: Nurse Practitioner

## 2023-11-22 DIAGNOSIS — H5203 Hypermetropia, bilateral: Secondary | ICD-10-CM | POA: Diagnosis not present

## 2023-11-28 ENCOUNTER — Telehealth: Payer: Self-pay | Admitting: Pharmacy Technician

## 2023-11-28 NOTE — Progress Notes (Signed)
 11/28/2023 Name: Philip Richardson MRN: [id number] DOB: 1956-05-10  Patient is appearing on a report for [employer] [place] [employer] Diabetes and last engaged with the clinical pharmacist to discuss diabetes on 03/14/2023. Contacted patient today to discuss diabetes management and completed medication review.   Diabetes Plan from last clinical pharmacist appointment:  Diabetes: - Currently uncontrolled. A1c above goal at 9.2 (goal <7).  - Reviewed long term cardiovascular and renal outcomes of uncontrolled blood sugar - Reviewed goal A1c, goal fasting, and goal 2 hour post prandial glucose - Reviewed dietary modifications including incorporating more protein, limiting carbohydrates, and incorporating more vegetables.  - Reviewed lifestyle modifications including continuing to remain active with walks and bowling.  - Recommend to increase Tresiba  100 units and is willing to try Ozempic 0.25 mg once weekly.  - Recommend to check glucose at least once daily each morning. -We will order annual uACR.  Follow Up Plan: Follow up with telephonic visit in 4 weeks.  Medication Adherence Barriers Identified:  Patient made recommended medication changes per plan: No Patient's regimen has changed since last Pharmacist follow up in Oct 2024. Patient informs he is taking Tresiba  92 units daily (Med list says 100 units daily) , Glimiperide 4mg  daily, Farxiga  10mg  daily and Humalog  via sliding scale Access issues with any new medication or testing device: Yes Patient is waiting to hear back about his patient assistance application for Farxiga  Patient portion of patient assistance completed: Yes Patient informs he filled out the application and sent in his income Patient is checking blood sugars as prescribed: Yes Patient informs he uses CGM and checks it a few times a day. Patient informs he has Tresiba  and Humalog  on hand but admittedly does not use them as ordered. Per Dr Annemarie, Tresiba  last sold on 10/31/23 for 16 day  supply (9ml), Humalog  last sold on 05/09/23 for 75 day supply (15ml) and Glimepiride  4mg  last sold on 10/10/23 for 90 day supply. Patient informs he only has 8 tablets of Farxiga  sample left. He informs he was waiting to be informed about his patient assistance for Farxiga .  Patient was informed patient assistance was APPROVED on 6/202025. Provided patient phone number to [employer] to call and check on his shipment. PharmD was sent an in basket to inquire if another sample could be provided to bridge the gap. Patient informs he uses CGM to check his blood sugars. He informs in the mornings before eating his blood sugars are ranging between 65-100 units and then after breakfast they go up to 150-200. In the afternoons, he informs the blood sugars go up to around 300 after eating. He informs he does not use the sliding scale as prescribed as he often doesn't remember to use it. Patient does express a desire to get blood sugars down. Informed patient Care Guide Jeoffrey Buffalo, RMA would be outreaching him to schedule an appointment with PharmD at [facility]. Patient was amenable to meeting with PharmD. Patient informs he tried Ozempic in the past but experience side effects of sever stomach cramps.  Medication Adherence Barriers Addressed/Actions Taken:  Reviewed medication changes per plan from last clinical pharmacist note Medication Access for Farxiga  Will discuss medication access concerns with pharmacist Educated patient to contact pharmacy regarding new prescriptions Provided patient phone number to [employer]. Patient Assistance Program approved; waiting on shipment Reviewed instructions for monitoring blood sugars at home and reminded patient to keep a written log to review with pharmacist Reminded patient of date/time of upcoming clinical pharmacist follow up and  any upcoming PCP/specialists visits. Patient denies transportation barriers to the appointment. Yes  Next clinical pharmacist appointment is scheduled  for: TBD  Kate Caddy, CPhT [facility] Office: [phone] Email: [email or web address]

## 2023-11-29 ENCOUNTER — Telehealth: Payer: Self-pay | Admitting: Pharmacist

## 2023-11-29 DIAGNOSIS — N185 Chronic kidney disease, stage 5: Secondary | ICD-10-CM

## 2023-11-29 MED ORDER — FARXIGA 10 MG PO TABS: 10.0000 mg | ORAL_TABLET | Freq: Every day | ORAL | 5 refills | Status: DC

## 2023-11-29 NOTE — Telephone Encounter (Signed)
 Pt made aware and will come by today to pick up

## 2023-11-29 NOTE — Telephone Encounter (Signed)
 Could someone please grab some Farxiga  10mg  daily samples for Philip Richardson and call him when they are ready? He is awaiting patient assistance Thank you very much! Field Staniszewski

## 2023-12-03 ENCOUNTER — Telehealth: Payer: Self-pay

## 2023-12-03 NOTE — Progress Notes (Signed)
 Complex Care Management Care Guide Note  12/03/2023 Name: Philip Richardson MRN: [id number] DOB: Oct 05, 1955  Philip Richardson is a 68 y.o. year old male who is a primary care patient of Gladis Mustard, FNP and is actively engaged with the care management team. I reached out to [employer] by phone today to assist with scheduling  with the Pharmacist.  Follow up plan: Telephone appointment with complex care management team member scheduled for:  12/19/2023  Jeoffrey Buffalo , RMA     [place]  [facility], [facility] Guide  Direct Dial: [phone]  Website: [email or web address]

## 2023-12-03 NOTE — Progress Notes (Signed)
 Complex Care Management Care Guide Note  12/03/2023 Name: Philip Richardson MRN: [id number] DOB: 01-05-1956  Philip Richardson is a 68 y.o. year old male who is a primary care patient of Gladis Mustard, FNP and is actively engaged with the care management team. I reached out to [employer] by phone today to assist with scheduling  with the Pharmacist.  Follow up plan: Unsuccessful telephone outreach attempt made. A HIPAA compliant phone message was left for the patient providing contact information and requesting a return call.  Philip Richardson , RMA     [facility]  [facility], [facility] Guide  Direct Dial: [phone]  Website: [email or web address]

## 2023-12-19 ENCOUNTER — Other Ambulatory Visit

## 2023-12-19 ENCOUNTER — Other Ambulatory Visit: Payer: Self-pay

## 2023-12-19 DIAGNOSIS — E119 Type 2 diabetes mellitus without complications: Secondary | ICD-10-CM

## 2023-12-19 DIAGNOSIS — Z794 Long term (current) use of insulin: Secondary | ICD-10-CM

## 2023-12-19 DIAGNOSIS — E1142 Type 2 diabetes mellitus with diabetic polyneuropathy: Secondary | ICD-10-CM

## 2023-12-19 NOTE — Progress Notes (Signed)
 12/19/2023 Name: Philip Richardson MRN: [id number] DOB: 09/11/1955  Chief Complaint  Patient presents with   Diabetes    Philip Richardson is a 68 y.o. year old male who presented for a telephone visit.  I connected with  Philip Richardson on 12/19/23 by telephone and verified that I am speaking with the correct person using two identifiers. I discussed the limitations of evaluation and management by telemedicine. The patient expressed understanding and agreed to proceed.  Patient was located in her home and PharmD in PCP office during this visit.   They were referred to the pharmacist by their PCP for assistance in managing diabetes and medication access.    Subjective:  Care Team: Primary Care Provider: Gladis Mustard, FNP   Medication Access/Adherence  Current Pharmacy:  [facility] [place], [place] - [address] [place] [address] Phone: [phone] Fax: [phone]  [facility] - [place], [place] - [address] [place] [address] Phone: [phone] Fax: [phone]   Patient reports affordability concerns with their medications: Yes  Patient reports access/transportation concerns to their pharmacy: No  Patient reports adherence concerns with their medications:  No     Diabetes:  Current medications: Farxiga  10mg  daily--just delivered via PAP, Tresiba  110 units daily, glimepiride  4mg  daily, Humalog  (not taking, but prescribed as sliding scale--> blood sugar is 100-150-2u;151-200-4u;201-250-6u;251-300-8u;>301-10u at lunch and supper) Often misses dose; longstanding non-compliance Medications tried in the past: rybelsus , invokana , levemir , toujeo , jentadueto , tradjenta , metformin , onglyza  Current meal patterns:  Discussed meal planning options and Plate method for healthy eating Avoid sugary drinks and desserts Incorporate balanced protein, non starchy veggies, 1 serving of carbohydrate with each  meal Increase water  intake Increase physical activity as able  Current medication access support: PAP for Farxiga ; attempting to get for Ozempic   Objective:  Lab Results  Component Value Date   HGBA1C 9.9 (H) 11/11/2023    Lab Results  Component Value Date   CREATININE 2.08 (H) 10/14/2023   BUN 17 10/14/2023   NA 139 10/14/2023   K 4.7 10/14/2023   CL 100 10/14/2023   CO2 23 10/14/2023   Lab Results  Component Value Date   CHOL 197 10/14/2023   HDL 36 (L) 10/14/2023   LDLCALC 129 (H) 10/14/2023   LDLDIRECT 113 (H) 04/08/2018   TRIG 179 (H) 10/14/2023   CHOLHDL 5.5 (H) 10/14/2023    Medications Reviewed Today     Reviewed by Philip Richardson, [facility] (Pharmacist) on 01/01/24 at 1452  Med List Status: <None>   Medication Order Taking? Sig Documenting Provider Last Dose Status Informant  acetaminophen  (TYLENOL ) 325 MG tablet [id number] No Take 325-650 mg by mouth every 6 (six) hours as needed (for pain.). [provider] Taking Active Self  amLODipine  (NORVASC ) 10 MG tablet [id number] No Take 1 tablet (10 mg total) by mouth daily. Philip Mary-Margaret, FNP Taking Active   Blood Glucose Monitoring Suppl (ONETOUCH VERIO FLEX SYSTEM) w/Device KIT [id number] No Use to test blood sugar twice daily. DX E11.9 Philip Mustard, FNP Taking Active Self  cloNIDine  (CATAPRES ) 0.1 MG tablet [id number]  TAKE 1 TABLET 3 TIMES A DAY Philip, Mary-Margaret, FNP  Active   Continuous Blood Gluc Receiver (FREESTYLE LIBRE 2 READER) DEVI [id number] No 1 each by Does not apply route daily. Philip Mustard, FNP Taking Active   Continuous Glucose Sensor (FREESTYLE LIBRE 2 SENSOR) [place] [id number] No 1 each by Does  not apply route every 14 (fourteen) days. DX: E11.65, Z79.4 Philip Mustard, FNP Taking Active   FARXIGA  10 MG TABS tablet [id number]  Take 1 tablet (10 mg total) by mouth daily before breakfast. Philip Mustard, FNP  Active   FEROSUL 325 (65 Fe) MG tablet [id number]   TAKE ONE TABLET DAILY WITH OFILIA Philip, Mary-Margaret, FNP  Active   fluticasone  (FLONASE ) 50 MCG/ACT nasal spray [id number] No Place 2 sprays into both nostrils daily. Philip Mustard, FNP Taking Active   glimepiride  (AMARYL ) 4 MG tablet [id number] No Take 1 tablet (4 mg total) by mouth daily before breakfast. Philip Mustard, FNP Taking Active   glucose blood (ONETOUCH VERIO) test strip [id number] No Use to test blood sugar twice daily. DX E11.9 Philip Mustard, FNP Taking Active Self  insulin  degludec (TRESIBA  FLEXTOUCH) 200 UNIT/ML FlexTouch Pen [id number] No Inject 110 Units into the skin daily. Philip Mustard, FNP Taking Active   insulin  lispro (HUMALOG ) 100 UNIT/ML injection [id number] No If blood sugar is 100-150-2u;151-200-4u;201-250-6u;251-300-8u;>301-10u at lunch and supper. Philip Mary-Margaret, FNP Taking Active   Insulin  Pen Needle (PEN NEEDLES 31GX5/16) 31G X 8 MM MISC [id number] No Use to inject insulin  daily as prescribed Philip Mustard, FNP Taking Active   OneTouch Delica Lancets 30G MISC [id number] No Use to test blood sugar twice daily. DX E11.9 Philip Mustard, FNP Taking Active Self  pantoprazole  (PROTONIX ) 40 MG tablet [id number] No Take 1 tablet (40 mg total) by mouth 2 (two) times daily. Philip Mustard, FNP Taking Active               Assessment/Plan:   Diabetes: - Currently uncontrolled--longstanding non-compliance do to cost and patient pref - Reviewed long term cardiovascular and renal outcomes of uncontrolled blood sugar - Reviewed goal A1c, goal fasting, and goal 2 hour post prandial glucose  Encouraged compliance with meal time insulin  Continue Tresiba  100-110 units U200 daily Start Ozempic 0.25mg  weekly for 4 weeks (titration slow due to history of GI intolerance to GLP1s) Continue Farxiga  10mg  daily Continue glimepiride  for now Patient denies personal or family history of multiple endocrine neoplasia type 2,  medullary thyroid  cancer; personal history of pancreatitis or gallbladder disease. - Recommend to check glucose daily (fasting) or if symptomatic - Meets financial criteria for Ozempic patient assistance program through [employer]. Will collaborate with provider, CPhT, and patient to pursue assistance.   Follow Up Plan: 1 month, endocrinology appt, nephrology  Mliss Tarry Griffin, PharmD, BCACP, CPP Clinical Pharmacist, [facility]

## 2023-12-28 DIAGNOSIS — L03019 Cellulitis of unspecified finger: Secondary | ICD-10-CM | POA: Diagnosis not present

## 2023-12-28 DIAGNOSIS — M79645 Pain in left finger(s): Secondary | ICD-10-CM | POA: Diagnosis not present

## 2023-12-28 DIAGNOSIS — M109 Gout, unspecified: Secondary | ICD-10-CM | POA: Diagnosis not present

## 2023-12-28 DIAGNOSIS — L03012 Cellulitis of left finger: Secondary | ICD-10-CM | POA: Diagnosis not present

## 2023-12-28 DIAGNOSIS — I10 Essential (primary) hypertension: Secondary | ICD-10-CM | POA: Diagnosis not present

## 2023-12-29 DIAGNOSIS — R030 Elevated blood-pressure reading, without diagnosis of hypertension: Secondary | ICD-10-CM | POA: Diagnosis not present

## 2023-12-29 DIAGNOSIS — M1A0421 Idiopathic chronic gout, left hand, with tophus (tophi): Secondary | ICD-10-CM | POA: Diagnosis not present

## 2023-12-30 ENCOUNTER — Other Ambulatory Visit: Payer: Self-pay | Admitting: Nurse Practitioner

## 2023-12-30 DIAGNOSIS — I10 Essential (primary) hypertension: Secondary | ICD-10-CM

## 2023-12-30 DIAGNOSIS — D649 Anemia, unspecified: Secondary | ICD-10-CM

## 2024-01-06 ENCOUNTER — Telehealth: Payer: Self-pay

## 2024-01-06 ENCOUNTER — Other Ambulatory Visit

## 2024-01-06 DIAGNOSIS — R809 Proteinuria, unspecified: Secondary | ICD-10-CM | POA: Diagnosis not present

## 2024-01-06 DIAGNOSIS — N1832 Chronic kidney disease, stage 3b: Secondary | ICD-10-CM | POA: Diagnosis not present

## 2024-01-06 DIAGNOSIS — E1122 Type 2 diabetes mellitus with diabetic chronic kidney disease: Secondary | ICD-10-CM | POA: Diagnosis not present

## 2024-01-06 NOTE — Telephone Encounter (Signed)
 Copied from CRM [id number]. Topic: Clinical - Request for Lab/Test Order >> Jan 06, 2024  8:22 AM Deaijah H wrote: Reason for CRM: Patient called in stating his kidney doctor wanted him to have blood work completed before 8/12

## 2024-01-06 NOTE — Telephone Encounter (Unsigned)
 Copied from CRM [id number]. Topic: Clinical - Request for Lab/Test Order >> Jan 06, 2024  9:19 AM Philip Richardson wrote: Patient Philip Richardson has orders from another facility to have labs, tried to call office but no answer. Can you please call and advise patient.

## 2024-01-06 NOTE — Telephone Encounter (Signed)
 Attempted to contact patient for more information. No answer and voicemail is not set up yet

## 2024-01-08 NOTE — Telephone Encounter (Signed)
 Looks like patient had his labs done on 8/4 for [place] [facility]. I left him a detailed message just making sure that this was the labs that he was needing and advised for him to call the office back if he needs more labs done for someone else.

## 2024-01-14 DIAGNOSIS — N1832 Chronic kidney disease, stage 3b: Secondary | ICD-10-CM | POA: Diagnosis not present

## 2024-01-14 DIAGNOSIS — I10 Essential (primary) hypertension: Secondary | ICD-10-CM | POA: Diagnosis not present

## 2024-01-14 DIAGNOSIS — R809 Proteinuria, unspecified: Secondary | ICD-10-CM | POA: Diagnosis not present

## 2024-01-14 DIAGNOSIS — E1122 Type 2 diabetes mellitus with diabetic chronic kidney disease: Secondary | ICD-10-CM | POA: Diagnosis not present

## 2024-01-21 ENCOUNTER — Telehealth: Payer: Self-pay | Admitting: Pharmacist

## 2024-01-21 NOTE — Telephone Encounter (Signed)
   Unsuccessful outreach to patient to discuss diabetes regimen.  The goal of the call today was to check in on newly started Ozempic and ensure patient has all of his medications. VM left encouraging return call.    Elisabella Hacker Dattero Schon Zeiders, PharmD, BCACP, CPP Clinical Pharmacist, [facility]

## 2024-01-30 ENCOUNTER — Other Ambulatory Visit: Payer: Self-pay | Admitting: Nurse Practitioner

## 2024-01-30 DIAGNOSIS — I10 Essential (primary) hypertension: Secondary | ICD-10-CM

## 2024-01-31 ENCOUNTER — Ambulatory Visit (INDEPENDENT_AMBULATORY_CARE_PROVIDER_SITE_OTHER)

## 2024-01-31 VITALS — Ht 67.0 in | Wt 200.0 lb

## 2024-01-31 DIAGNOSIS — Z0000 Encounter for general adult medical examination without abnormal findings: Secondary | ICD-10-CM

## 2024-01-31 NOTE — Progress Notes (Signed)
 Please attest and cosign this visit due to patients primary care provider not being in the office at the time the visit was completed.  Subjective:   Philip Richardson is a 68 y.o. who presents for a Medicare Wellness preventive visit.  As a reminder, Annual Wellness Visits don't include a physical exam, and some assessments may be limited, especially if this visit is performed virtually. We may recommend an in-person follow-up visit with your provider if needed.  Visit Complete: Virtual I connected with  Philip Richardson on 01/31/24 by a audio enabled telemedicine application and verified that I am speaking with the correct person using two identifiers.  Patient Location: Home  Provider Location: Home Office  I discussed the limitations of evaluation and management by telemedicine. The patient expressed understanding and agreed to proceed.  Vital Signs: Because this visit was a virtual/telehealth visit, some criteria may be missing or patient reported. Any vitals not documented were not able to be obtained and vitals that have been documented are patient reported.  VideoDeclined- This patient declined Librarian, academic. Therefore the visit was completed with audio only.  Persons Participating in Visit: Patient.  AWV Questionnaire: No: Patient Medicare AWV questionnaire was not completed prior to this visit.  Cardiac Risk Factors include: advanced age (>25men, >68 women);diabetes mellitus;dyslipidemia;hypertension;obesity (BMI >30kg/m2);male gender     Objective:    Today's Vitals   01/31/24 0942  Weight: 200 lb (90.7 kg)  Height: 5' 7 (1.702 m)  PainSc: 0-No pain   Body mass index is 31.32 kg/m.     01/31/2024   10:11 AM 07/05/2023   11:35 PM 08/02/2022    2:28 PM 06/07/2022    1:11 PM 04/23/2022    2:51 PM 04/25/2020    9:02 AM 07/15/2019    6:00 PM  Advanced Directives  Does Patient Have a Medical Advance Directive? No No No No No No No  Would  patient like information on creating a medical advance directive? No - Patient declined No - Patient declined No - Patient declined No - Patient declined  Yes (MAU/Ambulatory/Procedural Areas - Information given) No - Patient declined    Current Medications (verified) Outpatient Encounter Medications as of 01/31/2024  Medication Sig   acetaminophen  (TYLENOL ) 325 MG tablet Take 325-650 mg by mouth every 6 (six) hours as needed (for pain.).   amLODipine  (NORVASC ) 10 MG tablet Take 1 tablet (10 mg total) by mouth daily.   Blood Glucose Monitoring Suppl (ONETOUCH VERIO FLEX SYSTEM) w/Device KIT Use to test blood sugar twice daily. DX E11.9   cloNIDine  (CATAPRES ) 0.1 MG tablet TAKE 1 TABLET 3 TIMES A DAY   Continuous Blood Gluc Receiver (FREESTYLE LIBRE 2 READER) DEVI 1 each by Does not apply route daily.   Continuous Glucose Sensor (FREESTYLE LIBRE 2 SENSOR) MISC 1 each by Does not apply route every 14 (fourteen) days. DX: E11.65, Z79.4   FARXIGA  10 MG TABS tablet Take 1 tablet (10 mg total) by mouth daily before breakfast.   FEROSUL 325 (65 Fe) MG tablet TAKE ONE TABLET DAILY WITH BREAKFAST   fluticasone  (FLONASE ) 50 MCG/ACT nasal spray Place 2 sprays into both nostrils daily.   glimepiride  (AMARYL ) 4 MG tablet Take 1 tablet (4 mg total) by mouth daily before breakfast.   glucose blood (ONETOUCH VERIO) test strip Use to test blood sugar twice daily. DX E11.9   insulin  degludec (TRESIBA  FLEXTOUCH) 200 UNIT/ML FlexTouch Pen Inject 110 Units into the skin daily.   insulin  lispro (  HUMALOG ) 100 UNIT/ML injection If blood sugar is 100-150-2u;151-200-4u;201-250-6u;251-300-8u;>301-10u at lunch and supper.   Insulin  Pen Needle (PEN NEEDLES 31GX5/16) 31G X 8 MM MISC Use to inject insulin  daily as prescribed   OneTouch Delica Lancets 30G MISC Use to test blood sugar twice daily. DX E11.9   pantoprazole  (PROTONIX ) 40 MG tablet Take 1 tablet (40 mg total) by mouth 2 (two) times daily.   [DISCONTINUED]  cloNIDine  (CATAPRES ) 0.1 MG tablet TAKE 1 TABLET 3 TIMES A DAY   No facility-administered encounter medications on file as of 01/31/2024.    Allergies (verified) Atorvastatin , Invokana  [canagliflozin ], and Semaglutide    History: Past Medical History:  Diagnosis Date   Frequency of urination    GERD (gastroesophageal reflux disease)    [place] kidney    BILATERAL   Hypertension    Renal calculus, bilateral    Type 2 diabetes mellitus (HCC)    Urgency of urination    Wears dentures    Past Surgical History:  Procedure Laterality Date   BIOPSY  04/25/2020   Procedure: BIOPSY;  Surgeon: Cindie Carlin POUR, DO;  [facility]: [facility];  Service: Endoscopy;;  duodenum gastric esophagus   COLONOSCOPY WITH PROPOFOL  N/A 04/25/2020   internal hemorrhoids, one 5 mm polyp in descending colon. Tubular adenoma. 5 year surveillance.   CYSTOSCOPY W/ URETERAL STENT PLACEMENT Bilateral 12/09/2013   Procedure: CYSTOSCOPY WITH RETROGRADE PYELOGRAM/URETERAL STENT PLACEMENT;  Surgeon: Ricardo Likens, MD;  Location: [facility];  Service: Urology;  Laterality: Bilateral;   CYSTOSCOPY WITH RETROGRADE PYELOGRAM, URETEROSCOPY AND STENT PLACEMENT Bilateral 09/30/2013   Procedure: CYSTOSCOPY WITH BILATERAL RETROGRADE PYELOGRAM, LEFT DIAGNOSTIC URETEROSCOPY AND Left ureteral stent;  Surgeon: Ricardo Likens, MD;  Location[facility];  Service: Urology;  Laterality: Bilateral;   ESOPHAGOGASTRODUODENOSCOPY (EGD) WITH PROPOFOL  N/A 04/25/2020   Mildly severe candida esophagitis without bleed, s/p biopsy. Suspicion for eosinophilic esophagitis but no increased eosinophils. Gastritis. Reactive gastropathy. Negative H.pylori.  +KOH prep.    PERCUTANEOUS NEPHROLITHOTRIPSY  2005   POLYPECTOMY  04/25/2020   Procedure: POLYPECTOMY;  Surgeon: Cindie Carlin POUR, DO;  [facility];  Service: Endoscopy;;  colon   ROBOT ASSISTED PYELOPLASTY N/A 12/09/2013   Procedure: ROBOTIC ASSISTED BILATERAL  PYELOLITHOTOMY, RIGHT  PYELOPLASTY ;  Surgeon: Ricardo Likens, MD;  Location: [facility];  Service: Urology;  Laterality: N/A;   Family History  Problem Relation Age of Onset   Cancer Mother    Colon cancer Neg Hx    Pancreatitis Neg Hx    Social History   Socioeconomic History   Marital status: Single    Spouse name: Not on file   Number of children: Not on file   Years of education: Not on file   Highest education level: Not on file  Occupational History   Not on file  Tobacco Use   Smoking status: Never   Smokeless tobacco: Never  Vaping Use   Vaping status: Never Used  Substance and Sexual Activity   Alcohol use: No    Comment: no history of etoh use   Drug use: No   Sexual activity: Not on file  Other Topics Concern   Not on file  Social History Narrative   Not on file   Social Drivers of Health   Financial Resource Strain: Low Risk  (01/31/2024)   Overall Financial Resource Strain (CARDIA)    Difficulty of Paying Living Expenses: Not hard at all  Food Insecurity: No Food Insecurity (01/31/2024)   Hunger Vital Sign  Worried About Programme researcher, broadcasting/film/video in the Last Year: Never true    Ran Out of Food in the Last Year: Never true  Transportation Needs: No Transportation Needs (01/31/2024)   PRAPARE - Administrator, Civil Service (Medical): No    Lack of Transportation (Non-Medical): No  Physical Activity: Sufficiently Active (01/31/2024)   Exercise Vital Sign    Days of Exercise per Week: 7 days    Minutes of Exercise per Session: 30 min  Stress: No Stress Concern Present (01/31/2024)   [employer] of Occupational Health - Occupational Stress Questionnaire    Feeling of Stress: Not at all  Social Connections: Moderately Integrated (01/31/2024)   Social Connection and Isolation Panel    Frequency of Communication with Friends and Family: More than three times a week    Frequency of Social Gatherings with Friends and Family: More than three times a week     Attends Religious Services: More than 4 times per year    Active Member of [employer] or Organizations: No    Attends Engineer, structural: Never    Marital Status: Married    Tobacco Counseling Counseling given: Yes    Clinical Intake:  Pre-visit preparation completed: Yes  Pain : No/denies pain Pain Score: 0-No pain     BMI - recorded: 31.32 Nutritional Status: BMI > 30  Obese Diabetes: Yes CBG done?: No (telehealth visit.) Did pt. bring in CBG monitor from home?: No  Lab Results  Component Value Date   HGBA1C 9.9 (H) 11/11/2023   HGBA1C 9.6 (H) 10/14/2023   HGBA1C 9.4 (H) 08/12/2023     How often do you need to have someone help you when you read instructions, pamphlets, or other written materials from your doctor or pharmacy?: 1 - Never  Interpreter Needed?: No  Information entered by :: Paulina Muchmore W CMA (AAMA)   Activities of Daily Living     01/31/2024   10:11 AM  In your present state of health, do you have any difficulty performing the following activities:  Hearing? 0  Vision? 0  Difficulty concentrating or making decisions? 0  Walking or climbing stairs? 0  Dressing or bathing? 0  Doing errands, shopping? 0  Preparing Food and eating ? N  Using the Toilet? N  In the past six months, have you accidently leaked urine? N  Do you have problems with loss of bowel control? N  Managing your Medications? N  Managing your Finances? N  Housekeeping or managing your Housekeeping? N    Patient Care Team: Gladis Mustard, FNP as PCP - General (Nurse Practitioner) Billee Mliss BIRCH, [facility] (Pharmacist) Cindie Carlin POUR, DO as Consulting Physician (Internal Medicine) Ramonita Suzen CROME, RN as [facility] Care Management Ladora Ross Lacy Phebe, MD as Referring Physician (Optometry)  I have updated your Care Teams any recent Medical Services you may have received from other providers in the past year.     Assessment:   This is a routine wellness examination for  Philip Richardson.  Hearing/Vision screen Hearing Screening - Comments:: Patient denies any hearing difficulties.   Vision Screening - Comments:: Wears rx glasses - up to date with routine eye exams with  Dr. Ladora at [facility]    Goals Addressed               This Visit's Progress     Remain Active (pt-stated)        I want to go on a vacation to [place]         Depression Screen     01/31/2024   10:13 AM 11/11/2023   11:53 AM 10/14/2023   12:00 PM 08/12/2023   11:10 AM 07/15/2023   11:21 AM 07/05/2023   11:48 PM 06/14/2023   10:30 AM  PHQ 2/9 Scores  PHQ - 2 Score 0 0 0 0 0 0 0  PHQ- 9 Score 0          Fall Risk     01/31/2024   10:12 AM 11/11/2023   11:53 AM 10/14/2023   12:00 PM 08/12/2023   11:09 AM 07/15/2023   11:21 AM  Fall Risk   Falls in the past year? 0 0 0 0 0  Number falls in past yr: 0      Injury with Fall? 0      Risk for fall due to : No Fall Risks      Follow up Falls evaluation completed;Education provided;Falls prevention discussed        MEDICARE RISK AT HOME:  Medicare Risk at Home Any stairs in or around the home?: Yes If so, are there any without handrails?: Yes Home free of loose throw rugs in walkways, pet beds, electrical cords, etc?: Yes Adequate lighting in your home to reduce risk of falls?: Yes Life alert?: No Use of a cane, walker or w/c?: No Grab bars in the bathroom?: No Shower chair or bench in shower?: No Elevated toilet seat or a handicapped toilet?: No  TIMED UP AND GO:  Was the test performed?  No  Cognitive Function: 6CIT completed        01/31/2024   10:13 AM 08/02/2022    2:28 PM  6CIT Screen  What Year? 0 points 0 points  What month? 0 points 0 points  What time? 0 points 0 points  Count back from 20 0 points 0 points  Months in reverse 0 points 0 points  Repeat phrase 0 points 0 points  Total Score 0 points 0 points    Immunizations Immunization History  Administered Date(s) Administered   Fluad  Quad(high Dose 65+) 04/25/2021, 05/14/2022   Fluad Trivalent(High Dose 65+) 04/05/2023   Pneumococcal Polysaccharide-23 12/10/2013   Tdap 09/14/2010, 08/13/2022    Screening Tests Health Maintenance  Topic Date Due   Zoster Vaccines- Shingrix (1 of 2) Never done   Pneumococcal Vaccine: 50+ Years (2 of 2 - PCV) 12/11/2014   COVID-19 Vaccine (1 - 2024-25 season) Never done   OPHTHALMOLOGY EXAM  08/12/2023   INFLUENZA VACCINE  01/03/2024   HEMOGLOBIN A1C  05/12/2024   Diabetic kidney evaluation - Urine ACR  07/14/2024   Diabetic kidney evaluation - eGFR measurement  10/13/2024   FOOT EXAM  10/13/2024   Medicare Annual Wellness (AWV)  01/30/2025   Colonoscopy  04/25/2025   DTaP/Tdap/Td (3 - Td or Tdap) 08/12/2032   Hepatitis C Screening  Completed   HPV VACCINES  Aged Out   Meningococcal B Vaccine  Aged Out   Fecal DNA (Cologuard)  Discontinued    Health Maintenance  Health Maintenance Due  Topic Date Due   Zoster Vaccines- Shingrix (1 of 2) Never done   Pneumococcal Vaccine: 50+ Years (2 of 2 - PCV) 12/11/2014   COVID-19 Vaccine (1 - 2024-25 season) Never done   OPHTHALMOLOGY EXAM  08/12/2023   INFLUENZA VACCINE  01/03/2024   Health Maintenance Items Addressed: Routine health screenings are up to date. Patient is aware of recommended vaccines.   Additional  Screening:  Vision Screening: Recommended annual ophthalmology exams for early detection of glaucoma and other disorders of the eye. Would you like a referral to an eye doctor? No    Dental Screening: Recommended annual dental exams for proper oral hygiene  Community Resource Referral / Chronic Care Management: CRR required this visit?  No   CCM required this visit?  No   Plan:    I have personally reviewed and noted the following in the patient's chart:   Medical and social history Use of alcohol, tobacco or illicit drugs  Current medications and supplements including opioid prescriptions. Patient is not  currently taking opioid prescriptions. Functional ability and status Nutritional status Physical activity Advanced directives List of other physicians Hospitalizations, surgeries, and ER visits in previous 12 months Vitals Screenings to include cognitive, depression, and falls Referrals and appointments  In addition, I have reviewed and discussed with patient certain preventive protocols, quality metrics, and best practice recommendations. A written personalized care plan for preventive services as well as general preventive health recommendations were provided to patient.   Laura Caldas, CMA   01/31/2024   After Visit Summary: (MyChart) Due to this being a telephonic visit, the after visit summary with patients personalized plan was offered to patient via MyChart   Notes: Nothing significant to report at this time.

## 2024-01-31 NOTE — Patient Instructions (Signed)
 Mr. Philip Richardson , Thank you for taking time out of your busy schedule to complete your Annual Wellness Visit with me. I enjoyed our conversation and look forward to speaking with you again next year. I, as well as your care team,  appreciate your ongoing commitment to your health goals. Please review the following plan we discussed and let me know if I can assist you in the future.  Your Game plan/ To Do List   Follow up Visits: We will see or speak with you next year for your Next Medicare AWV with our clinical staff   Clinician Recommendations:  Aim for 30 minutes of exercise or brisk walking, 6-8 glasses of water , and 5 servings of fruits and vegetables each day.    Wishing you many blessings and good health during the next year until our next visit.  -Jashley Yellin   This is a list of the screenings recommended for you:  Health Maintenance  Topic Date Due   Zoster (Shingles) Vaccine (1 of 2) Never done   Pneumococcal Vaccine for age over 66 (2 of 2 - PCV) 12/11/2014   COVID-19 Vaccine (1 - 2024-25 season) Never done   Eye exam for diabetics  08/12/2023   Flu Shot  01/03/2024   Hemoglobin A1C  05/12/2024   Yearly kidney health urinalysis for diabetes  07/14/2024   Yearly kidney function blood test for diabetes  10/13/2024   Complete foot exam   10/13/2024   Medicare Annual Wellness Visit  01/30/2025   Colon Cancer Screening  04/25/2025   DTaP/Tdap/Td vaccine (3 - Td or Tdap) 08/12/2032   Hepatitis C Screening  Completed   HPV Vaccine  Aged Out   Meningitis B Vaccine  Aged Out   Cologuard (Stool DNA test)  Discontinued    Advanced directives: (Declined) Advance directive discussed with you today. Even though you declined this today, please call our office should you change your mind, and we can give you the proper paperwork for you to fill out. Advance Care Planning is important because it:  [x]  Makes sure you receive the medical care that is consistent with your values, goals, and  preferences  [x]  It provides guidance to your family and loved ones and reduces their decisional burden about whether or not they are making the right decisions based on your wishes.  Follow the link provided in your after visit summary or read over the paperwork we have mailed to you to help you started getting your Advance Directives in place. If you need assistance in completing these, please reach out to us  so that we can help you!  See attachments for Preventive Care and Fall Prevention Tips.

## 2024-03-02 ENCOUNTER — Other Ambulatory Visit: Payer: Self-pay | Admitting: Nurse Practitioner

## 2024-03-02 DIAGNOSIS — I10 Essential (primary) hypertension: Secondary | ICD-10-CM

## 2024-04-01 ENCOUNTER — Other Ambulatory Visit: Payer: Self-pay | Admitting: Nurse Practitioner

## 2024-04-01 DIAGNOSIS — E1142 Type 2 diabetes mellitus with diabetic polyneuropathy: Secondary | ICD-10-CM

## 2024-04-02 ENCOUNTER — Telehealth: Payer: Self-pay

## 2024-04-02 NOTE — Telephone Encounter (Signed)
 Pt returned call.  Provided [employer] phone number for him to call and confirm re-enrollment.

## 2024-04-02 NOTE — Telephone Encounter (Signed)
   Left message requesting call back to complete re-enrollment.

## 2024-04-03 ENCOUNTER — Other Ambulatory Visit: Payer: Self-pay | Admitting: Nurse Practitioner

## 2024-04-03 DIAGNOSIS — I10 Essential (primary) hypertension: Secondary | ICD-10-CM

## 2024-04-03 DIAGNOSIS — D649 Anemia, unspecified: Secondary | ICD-10-CM

## 2024-04-03 NOTE — Telephone Encounter (Signed)
 MMM pt NTBS 30-d given 03/02/24

## 2024-04-03 NOTE — Telephone Encounter (Signed)
Left message to schedule appt

## 2024-04-06 ENCOUNTER — Telehealth: Payer: Self-pay | Admitting: Family Medicine

## 2024-04-06 NOTE — Telephone Encounter (Signed)
 Apt tomorrow. LS

## 2024-04-06 NOTE — Telephone Encounter (Signed)
 Copied from CRM [id number]. Topic: Appointments - Scheduling Inquiry for Clinic >> Apr 03, 2024  3:41 PM Emylou G wrote: Reason for CRM: Patient called.. has an appt on the 18th but has concerned can't fill scripts w/o being seen?  Can he be seen by someone else he is asking?

## 2024-04-07 ENCOUNTER — Encounter: Payer: Self-pay | Admitting: Nurse Practitioner

## 2024-04-07 ENCOUNTER — Ambulatory Visit (INDEPENDENT_AMBULATORY_CARE_PROVIDER_SITE_OTHER): Admitting: Nurse Practitioner

## 2024-04-07 VITALS — BP 160/80 | HR 82 | Temp 98.2°F | Ht 67.0 in | Wt 191.0 lb

## 2024-04-07 DIAGNOSIS — E1142 Type 2 diabetes mellitus with diabetic polyneuropathy: Secondary | ICD-10-CM | POA: Diagnosis not present

## 2024-04-07 DIAGNOSIS — Z794 Long term (current) use of insulin: Secondary | ICD-10-CM | POA: Diagnosis not present

## 2024-04-07 DIAGNOSIS — I10 Essential (primary) hypertension: Secondary | ICD-10-CM | POA: Diagnosis not present

## 2024-04-07 DIAGNOSIS — Z23 Encounter for immunization: Secondary | ICD-10-CM | POA: Diagnosis not present

## 2024-04-07 DIAGNOSIS — E785 Hyperlipidemia, unspecified: Secondary | ICD-10-CM | POA: Diagnosis not present

## 2024-04-07 DIAGNOSIS — E1169 Type 2 diabetes mellitus with other specified complication: Secondary | ICD-10-CM

## 2024-04-07 LAB — BAYER DCA HB A1C WAIVED: HB A1C (BAYER DCA - WAIVED): 10.9 % — ABNORMAL HIGH (ref 4.8–5.6)

## 2024-04-07 LAB — LIPID PANEL

## 2024-04-07 NOTE — Progress Notes (Addendum)
 Subjective:    Patient ID: Philip Richardson, male    DOB: 1955-08-07, 68 y.o.   MRN: [id number]   Chief Complaint: medical management of chronic issues     HPI:  Philip Richardson is a 68 y.o. who identifies as a male who was assigned male at birth.   Social history: Lives with: by himself Work history: works part time  at a [employer] in today for follow up of the following chronic medical issues:  1. Primary hypertension No c/o chest pain, sob or headache. Does not check blood pressure at home. HE HAS BEEN OUT [facility] BLOOD PRESSURE MEDS FOR ABOUT ! WEEK. BP Readings from Last 3 Encounters:  11/11/23 135/68  10/14/23 (!) 148/78  08/12/23 (!) 150/74     2. Hyperlipidemia associated with type 2 diabetes mellitus (HCC) Does not really watch diet and does no dedicated exercise. Statins cause myalgia- refuses repatha  Lab Results  Component Value Date   CHOL 197 10/14/2023   HDL 36 (L) 10/14/2023   LDLCALC 129 (H) 10/14/2023   LDLDIRECT 113 (H) 04/08/2018   TRIG 179 (H) 10/14/2023   CHOLHDL 5.5 (H) 10/14/2023      3. Diabetes mellitus treated with insulin  and oral medication [facility]) Patient requested seeing endocrinology for his diabetes. He has an appointment scheduled for 04/16/24. He still does no check blood sugars very often. Is not compliant with diet either. Last time is saw him in June we increased his tresiba  to 110u as well as increased sliding scale base dose by 2u 4x a day. Battery went dead o his CGM and he has not been checking blood sugars.  Lab Results  Component Value Date   HGBA1C 9.9 (H) 11/11/2023     4. Hyperkalemia No c/o muscle cramps  5. Gastroesophageal reflux disease, unspecified whether esophagitis present Is on protonix  daily an dis doing well  6. CKD (chronic kidney disease) stage 5, GFR less than 15 ml/min (HCC) No voiding issues Lab Results  Component Value Date   CREATININE 2.08 (H) 10/14/2023     7. Normocytic  anemia No c/o fatigue Lab Results  Component Value Date   HGB 13.6 10/14/2023      New complaints: None today  Allergies  Allergen Reactions   Atorvastatin      Myopathy/weakness   Invokana  [Canagliflozin ] Other (See Comments)    weakness   Semaglutide  Other (See Comments)    Heartburn   Outpatient Encounter Medications as of 04/07/2024  Medication Sig   acetaminophen  (TYLENOL ) 325 MG tablet Take 325-650 mg by mouth every 6 (six) hours as needed (for pain.).   amLODipine  (NORVASC ) 10 MG tablet Take 1 tablet (10 mg total) by mouth daily.   Blood Glucose Monitoring Suppl (ONETOUCH VERIO FLEX SYSTEM) w/Device KIT Use to test blood sugar twice daily. DX E11.9   cloNIDine  (CATAPRES ) 0.1 MG tablet Take 1 tablet (0.1 mg total) by mouth 3 (three) times daily. **NEEDS TO BE SEEN BEFORE NEXT REFILL**   Continuous Blood Gluc Receiver (FREESTYLE LIBRE 2 READER) DEVI 1 each by Does not apply route daily.   Continuous Glucose Sensor (FREESTYLE LIBRE 2 SENSOR) MISC CHECK BLOOD SUGAR AS DIRECTED   FARXIGA  10 MG TABS tablet Take 1 tablet (10 mg total) by mouth daily before breakfast.   FEROSUL 325 (65 Fe) MG tablet TAKE ONE TABLET DAILY WITH BREAKFAST   fluticasone  (FLONASE ) 50 MCG/ACT nasal spray Place 2 sprays into both nostrils daily.   glimepiride  (AMARYL ) 4  MG tablet Take 1 tablet (4 mg total) by mouth daily before breakfast.   glucose blood (ONETOUCH VERIO) test strip Use to test blood sugar twice daily. DX E11.9   insulin  degludec (TRESIBA  FLEXTOUCH) 200 UNIT/ML FlexTouch Pen Inject 110 Units into the skin daily.   insulin  lispro (HUMALOG ) 100 UNIT/ML injection If blood sugar is 100-150-2u;151-200-4u;201-250-6u;251-300-8u;>301-10u at lunch and supper.   Insulin  Pen Needle (PEN NEEDLES 31GX5/16) 31G X 8 MM MISC Use to inject insulin  daily as prescribed   OneTouch Delica Lancets 30G MISC Use to test blood sugar twice daily. DX E11.9   pantoprazole  (PROTONIX ) 40 MG tablet Take 1 tablet (40 mg  total) by mouth 2 (two) times daily.   No facility-administered encounter medications on file as of 04/07/2024.    Past Surgical History:  Procedure Laterality Date   BIOPSY  04/25/2020   Procedure: BIOPSY;  Surgeon: Cindie Carlin POUR, DO;  Location: [facility];  Service: Endoscopy;;  duodenum gastric esophagus   COLONOSCOPY WITH PROPOFOL  N/A 04/25/2020   internal hemorrhoids, one 5 mm polyp in descending colon. Tubular adenoma. 5 year surveillance.   CYSTOSCOPY W/ URETERAL STENT PLACEMENT Bilateral 12/09/2013   Procedure: CYSTOSCOPY WITH RETROGRADE PYELOGRAM/URETERAL STENT PLACEMENT;  Surgeon: Ricardo Likens, MD;  Location: [facility];  Service: Urology;  Laterality: Bilateral;   CYSTOSCOPY WITH RETROGRADE PYELOGRAM, URETEROSCOPY AND STENT PLACEMENT Bilateral 09/30/2013   Procedure: CYSTOSCOPY WITH BILATERAL RETROGRADE PYELOGRAM, LEFT DIAGNOSTIC URETEROSCOPY AND Left ureteral stent;  Surgeon: Ricardo Likens, MD;  Location: [facility];  Service: Urology;  Laterality: Bilateral;   ESOPHAGOGASTRODUODENOSCOPY (EGD) WITH PROPOFOL  N/A 04/25/2020   Mildly severe candida esophagitis without bleed, s/p biopsy. Suspicion for eosinophilic esophagitis but no increased eosinophils. Gastritis. Reactive gastropathy. Negative H.pylori.  +KOH prep.    PERCUTANEOUS NEPHROLITHOTRIPSY  2005   POLYPECTOMY  04/25/2020   Procedure: POLYPECTOMY;  Surgeon: Cindie Carlin POUR, DO;  Location: [facility];  Service: Endoscopy;;  colon   ROBOT ASSISTED PYELOPLASTY N/A 12/09/2013   Procedure: ROBOTIC ASSISTED BILATERAL PYELOLITHOTOMY, RIGHT  PYELOPLASTY ;  Surgeon: Ricardo Likens, MD;  Location: [facility];  Service: Urology;  Laterality: N/A;    Family History  Problem Relation Age of Onset   Cancer Mother    Colon cancer Neg Hx    Pancreatitis Neg Hx       Controlled substance contract: n/a     Review of Systems  Constitutional:  Negative for diaphoresis.  Eyes:  Negative for pain.   Respiratory:  Negative for shortness of breath.   Cardiovascular:  Negative for chest pain, palpitations and leg swelling.  Gastrointestinal:  Negative for abdominal pain.  Endocrine: Negative for polydipsia.  Skin:  Negative for rash.  Neurological:  Negative for dizziness, weakness and headaches.  Hematological:  Does not bruise/bleed easily.  All other systems reviewed and are negative.      Objective:   Physical Exam Vitals and nursing note reviewed.  Constitutional:      Appearance: Normal appearance. He is well-developed.  HENT:     Head: Normocephalic.     Nose: Nose normal.     Mouth/Throat:     Mouth: Mucous membranes are moist.     Pharynx: Oropharynx is clear.  Eyes:     Pupils: Pupils are equal, round, and reactive to light.  Neck:     Thyroid : No thyroid  mass or thyromegaly.     Vascular: No carotid bruit or JVD.     Trachea: Phonation normal.  Cardiovascular:  Rate and Rhythm: Normal rate and regular rhythm.  Pulmonary:     Effort: Pulmonary effort is normal. No respiratory distress.     Breath sounds: Normal breath sounds.  Abdominal:     General: Bowel sounds are normal.     Palpations: Abdomen is soft.     Tenderness: There is no abdominal tenderness.  Musculoskeletal:        General: Normal range of motion.     Cervical back: Normal range of motion and neck supple.  Lymphadenopathy:     Cervical: No cervical adenopathy.  Skin:    General: Skin is warm and dry.  Neurological:     Mental Status: He is alert and oriented to person, place, and time.  Psychiatric:        Behavior: Behavior normal.        Thought Content: Thought content normal.        Judgment: Judgment normal.     BP (!) 160/80   Pulse 82   Temp 98.2 F (36.8 C) (Temporal)   Ht 5' 7 (1.702 m)   Wt 191 lb (86.6 kg)   SpO2 96%   BMI 29.91 kg/m    HGBA1c 10.9%      Assessment & Plan:  Philip Richardson comes in today with chief complaint of medical management of  chronic issues    Diagnosis and orders addressed:  1. Primary hypertension Low sodium diet DO NOT RUN OUT oF MEDS IF CAN HELp  IT!  2. Hyperlipidemia associated with type 2 diabetes mellitus (HCC) Low fat diet  3. Diabetes mellitus treated with insulin  and oral medication (HCC) Continue all current meds Will let endocrinology decide what they want to do about his diabetes. - insulin  degludec (TRESIBA  FLEXTOUCH) 200 UNIT/ML FlexTouch Pen; Inject 90 Units into the skin daily.  Dispense: 15 mL; Refill: 5  4. Hyperkalemia Not time for labs  5. Gastroesophageal reflux disease, unspecified whether esophagitis present Avoid spicy foods Do not eat 2 hours prior to bedtime   6. CKD (chronic kidney disease) stage 5, GFR less than 15 ml/min (HCC) Not time for labs  7. Normocytic anemia Not time for labs  abs pending Health Maintenance reviewed Diet and exercise encouraged  Follow up plan: 3 months   Mary-Margaret Gladis, FNP

## 2024-04-08 ENCOUNTER — Telehealth: Payer: Self-pay | Admitting: Family Medicine

## 2024-04-08 ENCOUNTER — Other Ambulatory Visit: Payer: Self-pay | Admitting: *Deleted

## 2024-04-08 DIAGNOSIS — I10 Essential (primary) hypertension: Secondary | ICD-10-CM

## 2024-04-08 LAB — CMP14+EGFR
ALT: 23 IU/L (ref 0–44)
AST: 15 IU/L (ref 0–40)
Albumin: 3.9 g/dL (ref 3.9–4.9)
Alkaline Phosphatase: 106 IU/L (ref 47–123)
BUN/Creatinine Ratio: 14 (ref 10–24)
BUN: 33 mg/dL — AB (ref 8–27)
Bilirubin Total: 0.4 mg/dL (ref 0.0–1.2)
CO2: 22 mmol/L (ref 20–29)
Calcium: 9.6 mg/dL (ref 8.6–10.2)
Chloride: 95 mmol/L — AB (ref 96–106)
Creatinine, Ser: 2.43 mg/dL — AB (ref 0.76–1.27)
Globulin, Total: 2.2 g/dL (ref 1.5–4.5)
Glucose: 497 mg/dL — AB (ref 70–99)
Potassium: 4.7 mmol/L (ref 3.5–5.2)
Sodium: 131 mmol/L — AB (ref 134–144)
Total Protein: 6.1 g/dL (ref 6.0–8.5)
eGFR: 28 mL/min/1.73 — AB (ref >59)

## 2024-04-08 LAB — CBC WITH DIFFERENTIAL/PLATELET
Basophils Absolute: 0.1 x10E3/uL (ref 0.0–0.2)
Basos: 1 %
EOS (ABSOLUTE): 0.1 x10E3/uL (ref 0.0–0.4)
Eos: 1 %
Hematocrit: 43.2 % (ref 37.5–51.0)
Hemoglobin: 14.2 g/dL (ref 13.0–17.7)
Immature Grans (Abs): 0 x10E3/uL (ref 0.0–0.1)
Immature Granulocytes: 0 %
Lymphocytes Absolute: 1.1 x10E3/uL (ref 0.7–3.1)
Lymphs: 16 %
MCH: 28.5 pg (ref 26.6–33.0)
MCHC: 32.9 g/dL (ref 31.5–35.7)
MCV: 87 fL (ref 79–97)
Monocytes Absolute: 0.6 x10E3/uL (ref 0.1–0.9)
Monocytes: 8 %
Neutrophils Absolute: 5.3 x10E3/uL (ref 1.4–7.0)
Neutrophils: 74 %
Platelets: 189 x10E3/uL (ref 150–450)
RBC: 4.99 x10E6/uL (ref 4.14–5.80)
RDW: 13.5 % (ref 11.6–15.4)
WBC: 7.1 x10E3/uL (ref 3.4–10.8)

## 2024-04-08 LAB — LIPID PANEL
Cholesterol, Total: 197 mg/dL (ref 100–199)
HDL: 34 mg/dL — AB (ref >39)
LDL CALC COMMENT:: 5.8 ratio — AB (ref 0.0–5.0)
LDL Chol Calc (NIH): 124 mg/dL — AB (ref 0–99)
Triglycerides: 219 mg/dL — AB (ref 0–149)
VLDL Cholesterol Cal: 39 mg/dL (ref 5–40)

## 2024-04-08 NOTE — Telephone Encounter (Signed)
 Critical lab was called in about his glucose of 497.  Looks like his A1c was elevated as well that was discussed in the visit, nothing to add there but definitely focus on his diabetes

## 2024-04-08 NOTE — Telephone Encounter (Signed)
 Left message for pt to return call.

## 2024-04-09 ENCOUNTER — Ambulatory Visit: Payer: Self-pay | Admitting: Nurse Practitioner

## 2024-04-09 DIAGNOSIS — E1142 Type 2 diabetes mellitus with diabetic polyneuropathy: Secondary | ICD-10-CM

## 2024-04-10 NOTE — Telephone Encounter (Signed)
 Left message to call back.

## 2024-04-13 ENCOUNTER — Telehealth: Payer: Self-pay | Admitting: Family Medicine

## 2024-04-13 ENCOUNTER — Other Ambulatory Visit

## 2024-04-13 ENCOUNTER — Ambulatory Visit: Payer: Self-pay | Admitting: Nurse Practitioner

## 2024-04-13 DIAGNOSIS — Z794 Long term (current) use of insulin: Secondary | ICD-10-CM | POA: Diagnosis not present

## 2024-04-13 DIAGNOSIS — E1142 Type 2 diabetes mellitus with diabetic polyneuropathy: Secondary | ICD-10-CM | POA: Diagnosis not present

## 2024-04-13 LAB — URINALYSIS
Bilirubin, UA: NEGATIVE
Ketones, UA: NEGATIVE
Leukocytes,UA: NEGATIVE
Nitrite, UA: NEGATIVE
Specific Gravity, UA: 1.015 (ref 1.005–1.030)
Urobilinogen, Ur: 0.2 mg/dL (ref 0.2–1.0)
pH, UA: 7 (ref 5.0–7.5)

## 2024-04-13 NOTE — Telephone Encounter (Signed)
 Duplicate telephone call. See other encounter

## 2024-04-13 NOTE — Telephone Encounter (Signed)
 Copied from CRM [id number]. Topic: Clinical - Lab/Test Results >> Apr 13, 2024 11:24 AM Roselie BROCKS wrote: Reason for CRM: Patient called for lab results I provided the results and let patient know he needs to schedule a urine test, Patient stated he would call back to get that scheduled.

## 2024-04-16 ENCOUNTER — Encounter: Payer: Self-pay | Admitting: Nurse Practitioner

## 2024-04-16 ENCOUNTER — Ambulatory Visit: Admitting: Nurse Practitioner

## 2024-04-16 VITALS — BP 128/78 | HR 60 | Ht 73.0 in | Wt 194.8 lb

## 2024-04-16 DIAGNOSIS — E119 Type 2 diabetes mellitus without complications: Secondary | ICD-10-CM | POA: Diagnosis not present

## 2024-04-16 DIAGNOSIS — I10 Essential (primary) hypertension: Secondary | ICD-10-CM | POA: Diagnosis not present

## 2024-04-16 DIAGNOSIS — Z794 Long term (current) use of insulin: Secondary | ICD-10-CM | POA: Diagnosis not present

## 2024-04-16 DIAGNOSIS — Z7984 Long term (current) use of oral hypoglycemic drugs: Secondary | ICD-10-CM | POA: Diagnosis not present

## 2024-04-16 DIAGNOSIS — E1165 Type 2 diabetes mellitus with hyperglycemia: Secondary | ICD-10-CM

## 2024-04-16 DIAGNOSIS — E1142 Type 2 diabetes mellitus with diabetic polyneuropathy: Secondary | ICD-10-CM

## 2024-04-16 DIAGNOSIS — E782 Mixed hyperlipidemia: Secondary | ICD-10-CM

## 2024-04-16 DIAGNOSIS — Z789 Other specified health status: Secondary | ICD-10-CM | POA: Diagnosis not present

## 2024-04-16 DIAGNOSIS — Z91199 Patient's noncompliance with other medical treatment and regimen due to unspecified reason: Secondary | ICD-10-CM

## 2024-04-16 MED ORDER — GLIMEPIRIDE 4 MG PO TABS: 4.0000 mg | ORAL_TABLET | Freq: Every day | ORAL | 1 refills | Status: DC

## 2024-04-16 MED ORDER — TRESIBA FLEXTOUCH 200 UNIT/ML ~~LOC~~ SOPN: 50.0000 [IU] | PEN_INJECTOR | SUBCUTANEOUS | 3 refills | Status: DC

## 2024-04-16 MED ORDER — "PEN NEEDLES 5/16"" 31G X 8 MM MISC": 5 refills | Status: DC

## 2024-04-16 NOTE — Progress Notes (Unsigned)
 Endocrinology Consult Note       68/14/2025, 7:02 AM   Subjective:    Patient ID: Philip Richardson, male    DOB: 68-Aug-1957.  Philip Richardson is being seen in consultation for management of currently uncontrolled symptomatic diabetes requested by  Gladis Mustard, FNP.   Past Medical History:  Diagnosis Date   Frequency of urination    GERD (gastroesophageal reflux disease)    Horseshoe kidney    BILATERAL   Hypertension    Renal calculus, bilateral    Type 2 diabetes mellitus (HCC)    Urgency of urination    Wears dentures     Past Surgical History:  Procedure Laterality Date   BIOPSY  04/25/2020   Procedure: BIOPSY;  Surgeon: Cindie Carlin POUR, DO;  Location: [facility];  Service: Endoscopy;;  duodenum gastric esophagus   COLONOSCOPY WITH PROPOFOL  N/A 04/25/2020   internal hemorrhoids, one 5 mm polyp in descending colon. Tubular adenoma. 5 year surveillance.   CYSTOSCOPY W/ URETERAL STENT PLACEMENT Bilateral 12/09/2013   Procedure: CYSTOSCOPY WITH RETROGRADE PYELOGRAM/URETERAL STENT PLACEMENT;  Surgeon: Ricardo Likens, MD;  Location: [facility];  Service: Urology;  Laterality: Bilateral;   CYSTOSCOPY WITH RETROGRADE PYELOGRAM, URETEROSCOPY AND STENT PLACEMENT Bilateral 09/30/2013   Procedure: CYSTOSCOPY WITH BILATERAL RETROGRADE PYELOGRAM, LEFT DIAGNOSTIC URETEROSCOPY AND Left ureteral stent;  Surgeon: Ricardo Likens, MD;  Location: [facility];  Service: Urology;  Laterality: Bilateral;   ESOPHAGOGASTRODUODENOSCOPY (EGD) WITH PROPOFOL  N/A 04/25/2020   Mildly severe candida esophagitis without bleed, s/p biopsy. Suspicion for eosinophilic esophagitis but no increased eosinophils. Gastritis. Reactive gastropathy. Negative H.pylori.  +KOH prep.    PERCUTANEOUS NEPHROLITHOTRIPSY  2005   POLYPECTOMY  04/25/2020   Procedure: POLYPECTOMY;  Surgeon: Cindie Carlin POUR, DO;  Location: [facility];   Service: Endoscopy;;  colon   ROBOT ASSISTED PYELOPLASTY N/A 12/09/2013   Procedure: ROBOTIC ASSISTED BILATERAL PYELOLITHOTOMY, RIGHT  PYELOPLASTY ;  Surgeon: Ricardo Likens, MD;  Location: [facility];  Service: Urology;  Laterality: N/A;    Social History   Socioeconomic History   Marital status: Single    Spouse name: Not on file   Number of children: Not on file   Years of education: Not on file   Highest education level: Not on file  Occupational History   Not on file  Tobacco Use   Smoking status: Never   Smokeless tobacco: Never  Vaping Use   Vaping status: Never Used  Substance and Sexual Activity   Alcohol use: No    Comment: no history of etoh use   Drug use: No   Sexual activity: Not on file  Other Topics Concern   Not on file  Social History Narrative   Not on file   Social Drivers of Health   Financial Resource Strain: Low Risk  (01/31/2024)   Overall Financial Resource Strain (CARDIA)    Difficulty of Paying Living Expenses: Not hard at all  Food Insecurity: No Food Insecurity (01/31/2024)   Hunger Vital Sign    Worried About Running Out of Food in the Last Year: Never true    Ran Out of Food in the Last Year: Never true  Transportation Needs: No  Transportation Needs (01/31/2024)   PRAPARE - Administrator, Civil Service (Medical): No    Lack of Transportation (Non-Medical): No  Physical Activity: Sufficiently Active (01/31/2024)   Exercise Vital Sign    Days of Exercise per Week: 7 days    Minutes of Exercise per Session: 30 min  Stress: No Stress Concern Present (01/31/2024)   [employer] of Occupational Health - Occupational Stress Questionnaire    Feeling of Stress: Not at all  Social Connections: Moderately Integrated (01/31/2024)   Social Connection and Isolation Panel    Frequency of Communication with Friends and Family: More than three times a week    Frequency of Social Gatherings with Friends and Family: More than three times a week     Attends Religious Services: More than 4 times per year    Active Member of [employer] or Organizations: No    Attends Banker Meetings: Never    Marital Status: Married    Family History  Problem Relation Age of Onset   Cancer Mother    Colon cancer Neg Hx    Pancreatitis Neg Hx     Outpatient Encounter Medications as of 04/16/2024  Medication Sig   acetaminophen  (TYLENOL ) 325 MG tablet Take 325-650 mg by mouth every 6 (six) hours as needed (for pain.).   amLODipine  (NORVASC ) 10 MG tablet Take 1 tablet (10 mg total) by mouth daily.   cloNIDine  (CATAPRES ) 0.1 MG tablet Take 1 tablet (0.1 mg total) by mouth 3 (three) times daily.   Continuous Blood Gluc Receiver (FREESTYLE LIBRE 2 READER) DEVI 1 each by Does not apply route daily.   Continuous Glucose Sensor (FREESTYLE LIBRE 2 SENSOR) MISC CHECK BLOOD SUGAR AS DIRECTED   FARXIGA  10 MG TABS tablet Take 1 tablet (10 mg total) by mouth daily before breakfast.   FEROSUL 325 (65 Fe) MG tablet TAKE ONE TABLET DAILY WITH BREAKFAST   losartan (COZAAR) 25 MG tablet Take 25 mg by mouth daily.   Multiple Vitamins-Minerals (CENTRUM SILVER MEN 50+ PO) Take by mouth daily.   OneTouch Delica Lancets 30G MISC Use to test blood sugar twice daily. DX E11.9   OVER THE COUNTER MEDICATION Take by mouth in the morning and at bedtime. Nature Made Cholest Off   OVER THE COUNTER MEDICATION Take by mouth daily. Nature's Bounty - Magnesium  500 mg   pantoprazole  (PROTONIX ) 40 MG tablet Take 1 tablet (40 mg total) by mouth 2 (two) times daily.   [DISCONTINUED] glimepiride  (AMARYL ) 4 MG tablet Take 1 tablet (4 mg total) by mouth daily before breakfast.   [DISCONTINUED] insulin  degludec (TRESIBA  FLEXTOUCH) 200 UNIT/ML FlexTouch Pen Inject 110 Units into the skin daily.   [DISCONTINUED] Insulin  Pen Needle (PEN NEEDLES 31GX5/16) 31G X 8 MM MISC Use to inject insulin  daily as prescribed   Blood Glucose Monitoring Suppl (ONETOUCH VERIO FLEX SYSTEM) w/Device  KIT Use to test blood sugar twice daily. DX E11.9 (Patient not taking: Reported on 04/16/2024)   glimepiride  (AMARYL ) 4 MG tablet Take 1 tablet (4 mg total) by mouth daily with breakfast.   glucose blood (ONETOUCH VERIO) test strip Use to test blood sugar twice daily. DX E11.9 (Patient not taking: Reported on 04/16/2024)   insulin  degludec (TRESIBA  FLEXTOUCH) 200 UNIT/ML FlexTouch Pen Inject 50 Units into the skin daily.   Insulin  Pen Needle (PEN NEEDLES 31GX5/16) 31G X 8 MM MISC Use to inject insulin  daily as prescribed   [DISCONTINUED] fluticasone  (FLONASE ) 50 MCG/ACT nasal spray Place 2 sprays  into both nostrils daily. (Patient not taking: Reported on 04/16/2024)   [DISCONTINUED] insulin  lispro (HUMALOG ) 100 UNIT/ML injection If blood sugar is 100-150-2u;151-200-4u;201-250-6u;251-300-8u;>301-10u at lunch and supper. (Patient not taking: Reported on 04/16/2024)   No facility-administered encounter medications on file as of 04/16/2024.    ALLERGIES: Allergies  Allergen Reactions   Atorvastatin      Myopathy/weakness   Invokana  [Canagliflozin ] Other (See Comments)    weakness   Semaglutide  Other (See Comments)    Heartburn    VACCINATION STATUS: Immunization History  Administered Date(s) Administered   Fluad Quad(high Dose 65+) 04/25/2021, 05/14/2022   Fluad Trivalent(High Dose 65+) 04/05/2023   INFLUENZA, HIGH DOSE SEASONAL PF 04/07/2024   Pneumococcal Polysaccharide-23 12/10/2013   Tdap 09/14/2010, 08/13/2022    Diabetes He presents for his initial diabetic visit. He has type 2 diabetes mellitus. Onset time: diagnosed at approx age of 68. His disease course has been fluctuating. Hypoglycemia symptoms include hunger. Pertinent negatives for hypoglycemia include no nervousness/anxiousness, sweats or tremors. Associated symptoms include blurred vision, fatigue, polydipsia and polyuria. There are no hypoglycemic complications. Symptoms are stable. Diabetic complications include  nephropathy. Risk factors for coronary artery disease include diabetes mellitus, dyslipidemia, family history, male sex and hypertension. Current diabetic treatment includes oral agent (dual therapy) and insulin  injections. He is compliant with treatment most of the time (was prescribed Humalog  but did not take it due to fear of hypoglycemia). His weight is fluctuating minimally. He is following a generally unhealthy diet. When asked about meal planning, he reported none. He has not had a previous visit with a dietitian. He participates in exercise intermittently. His home blood glucose trend is fluctuating dramatically. (He presents today for his consultation with his CGM showing dramatically fluctuating glycemic profile.  His most recent A1c on 11/4 was 10.9%.  He drinks mainly unsweet tea, some SF drinks.  He eats breakfast and a snack for lunch, then eats a substantial supper.  He admits he is a picky eater, does not like many veggies.  He is also single and often times eats out.  He does walk routinely for exercise.  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  He is UTD on eye exam, has never seen podiatry in the past.  He also notes he sometimes changes the dose of his night time insulin  due to drops in the middle of the night, last night he took 92 units.  ) An ACE inhibitor/angiotensin II receptor blocker is not being taken. He does not see a podiatrist.Eye exam is current.     Review of systems  Constitutional: + decreasing body weight, current Body mass index is 25.7 kg/m., no fatigue, no subjective hyperthermia, no subjective hypothermia Eyes: no blurry vision, no xerophthalmia ENT: no sore throat, no nodules palpated in throat, no dysphagia/odynophagia, no hoarseness Cardiovascular: no chest pain, no shortness of breath, no palpitations, no leg swelling Respiratory: no cough, no shortness of breath Gastrointestinal: no nausea/vomiting/diarrhea Genitourinary: + polyuria (on  Farxiga ) Musculoskeletal: no muscle/joint aches Skin: no rashes, no hyperemia Neurological: no tremors, no numbness, no tingling, no dizziness Psychiatric: no depression, no anxiety  Objective:     BP 128/78 (BP Location: Right Arm, Patient Position: Sitting, Cuff Size: Large)   Pulse 60   Ht 6' 1 (1.854 m)   Wt 194 lb 12.8 oz (88.4 kg)   BMI 25.70 kg/m   Wt Readings from Last 3 Encounters:  04/16/24 194 lb 12.8 oz (88.4 kg)  04/07/24 191 lb (86.6  kg)  01/31/24 200 lb (90.7 kg)     BP Readings from Last 3 Encounters:  04/16/24 128/78  04/07/24 (!) 160/80  11/11/23 135/68     Physical Exam- Limited  Constitutional:  Body mass index is 25.7 kg/m. , not in acute distress, normal state of mind Eyes:  EOMI, no exophthalmos Neck: Supple Cardiovascular: RRR, no murmurs, rubs, or gallops, no edema Respiratory: Adequate breathing efforts, no crackles, rales, rhonchi, or wheezing Musculoskeletal: no gross deformities, strength intact in all four extremities, no gross restriction of joint movements Skin:  no rashes, no hyperemia Neurological: no tremor with outstretched hands   Diabetic Foot Exam - Simple   No data filed      CMP ( most recent) CMP     Component Value Date/Time   NA 131 (L) 04/07/2024 1445   K 4.7 04/07/2024 1445   CL 95 (L) 04/07/2024 1445   CO2 22 04/07/2024 1445   GLUCOSE 497 (HH) 04/07/2024 1445   GLUCOSE 173 (H) 07/22/2019 0626   BUN 33 (H) 04/07/2024 1445   CREATININE 2.43 (H) 04/07/2024 1445   CALCIUM  9.6 04/07/2024 1445   PROT 6.1 04/07/2024 1445   ALBUMIN 3.9 04/07/2024 1445   AST 15 04/07/2024 1445   ALT 23 04/07/2024 1445   ALKPHOS 106 04/07/2024 1445   BILITOT 0.4 04/07/2024 1445   EGFR 28 (L) 04/07/2024 1445   GFRNONAA 26 (L) 06/09/2020 1101     Diabetic Labs (most recent): Lab Results  Component Value Date   HGBA1C 10.9 (H) 04/07/2024   HGBA1C 9.9 (H) 11/11/2023   HGBA1C 9.6 (H) 10/14/2023   MICROALBUR 20 08/19/2013      Lipid Panel ( most recent) Lipid Panel     Component Value Date/Time   CHOL 197 04/07/2024 1445   TRIG 219 (H) 04/07/2024 1445   TRIG 100 04/21/2014 1455   HDL 34 (L) 04/07/2024 1445   HDL 46 04/21/2014 1455   CHOLHDL 5.8 (H) 04/07/2024 1445   LDLCALC 124 (H) 04/07/2024 1445   LDLCALC 103 (H) 01/13/2014 1342   LDLDIRECT 113 (H) 04/08/2018 1622   LABVLDL 39 04/07/2024 1445      Lab Results  Component Value Date   TSH 0.582 08/19/2013           Assessment & Plan:   1) Type 2 diabetes mellitus with hyperglycemia, with long-term current use of insulin  (HCC) (Primary)  He presents today for his consultation with his CGM showing dramatically fluctuating glycemic profile.  His most recent A1c on 11/4 was 10.9%.  He drinks mainly unsweet tea, some SF drinks.  He eats breakfast and a snack for lunch, then eats a substantial supper.  He admits he is a picky eater, does not like many veggies.  He is also single and often times eats out.  He does walk routinely for exercise.  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  He is UTD on eye exam, has never seen podiatry in the past.  He also notes he sometimes changes the dose of his night time insulin  due to drops in the middle of the night, last night he took 92 units.    Analysis of his CGM shows TIR 7%, TAR 93% (75% in level 2 hyperglycemia range), TBR 0% with a GMI of 10.8%.  - Lamount Herzig has currently uncontrolled symptomatic type 2 DM since 68 years of age, with most recent A1c of 10.9 %.   -Recent labs reviewed.  -  I had a long discussion with him about the progressive nature of diabetes and the pathology behind its complications. -his diabetes is complicated by nephropathy, pancreatitis, and noncompliance (per PCP) and he remains at a high risk for more acute and chronic complications which include CAD, CVA, CKD, retinopathy, and neuropathy. These are all discussed in detail with him.  The following Lifestyle  Medicine recommendations according to American College of Lifestyle Medicine [facility]) were discussed and offered to patient and he agrees to start the journey:  A. Whole Foods, Plant-based plate comprising of fruits and vegetables, plant-based proteins, whole-grain carbohydrates was discussed in detail with the patient.   A list for source of those nutrients were also provided to the patient.  Patient will use only water  or unsweetened tea for hydration. B.  The need to stay away from risky substances including alcohol, smoking; obtaining 7 to 9 hours of restorative sleep, at least 150 minutes of moderate intensity exercise weekly, the importance of healthy social connections,  and stress reduction techniques were discussed. C.  A full color page of  Calorie density of various food groups per pound showing examples of each food groups was provided to the patient.  - I have counseled him on diet and weight management by adopting a carbohydrate restricted/protein rich diet. Patient is encouraged to switch to unprocessed or minimally processed complex starch and increased protein intake (animal or plant source), fruits, and vegetables. -  he is advised to stick to a routine mealtimes to eat 3 meals a day and avoid unnecessary snacks (to snack only to correct hypoglycemia).   - he acknowledges that there is a room for improvement in his food and drink choices. - Suggestion is made for him to avoid simple carbohydrates from his diet including Cakes, Sweet Desserts, Ice Cream, Soda (diet and regular), Sweet Tea, Candies, Chips, Cookies, Store Bought Juices, Alcohol in Excess of 1-2 drinks a day, Artificial Sweeteners, Coffee Creamer, and Sugar-free Products. This will help patient to have more stable blood glucose profile and potentially avoid unintended weight gain.  - I have approached him with the following individualized plan to manage his diabetes and patient agrees:   -Suspect he may be  over-insulinized.  He has fear of hypoglycemia and often gets low alarms while he is sleeping.  Will reduce Tresiba  to 50 units SQ nightly.  He can continue Farxiga  10 mg po daily (will be beneficial for kidneys) and we did go over potential side effects of this class of medications to watch for.  He can also continue Glimepiride  4 mg po daily at breakfast.  Since he has not been taking his Humalog  recently, will allow him to stay off of this for now.  We did talk about potentially needing it again, but he is willing to make some changes with diet, thus he may not need it.  -he is encouraged to start/continue monitoring glucose 4 times daily, before meals and before bed, and to call the clinic if he has readings less than 70 or above 300 for 3 tests in a row.  I did ask that he send me an update on his glucose in the next 2 weeks so we can adjust his meds accordingly.  - he is warned not to take insulin  without proper monitoring per orders. - Adjustment parameters are given to him for hypo and hyperglycemia in writing.  - he is not a candidate for Metformin  due to concurrent renal insufficiency.  - he is not an  ideal candidate for incretin therapy given his hx of pancreatitis and due to body habitus (with BMI 25).  - Specific targets for  A1c; LDL, HDL, and Triglycerides were discussed with the patient.  2) Blood Pressure /Hypertension:  his blood pressure is controlled to target.   he is advised to continue his current medications as prescribed by PCP/nephrology.  3) Lipids/Hyperlipidemia:    Review of his recent lipid panel from 04/07/24 showed uncontrolled LDL at 124 and elevated triglycerides of 219.  He reportedly does not tolerate statins due to myalgias.  4)  Weight/Diet:  his Body mass index is 25.7 kg/m.  -  he is NOT a candidate for weight loss. I discussed with him the fact that loss of 5 - 10% of his  current body weight will have the most impact on his diabetes management.  Exercise,  and detailed carbohydrates information provided  -  detailed on discharge instructions.  5) Chronic Care/Health Maintenance: -he is not on ACEI/ARB or Statin medications and is encouraged to initiate and continue to follow up with Ophthalmology, Dentist, Podiatrist at least yearly or according to recommendations, and advised to stay away from smoking. I have recommended yearly flu vaccine and pneumonia vaccine at least every 5 years; moderate intensity exercise for up to 150 minutes weekly; and sleep for at least 7 hours a day.  - he is advised to maintain close follow up with Gladis Mustard, FNP for primary care needs, as well as his other providers for optimal and coordinated care.   - Time spent in this patient care: 60 min, which was spent in counseling him about his diabetes and the rest reviewing his blood glucose logs, discussing his hypoglycemia and hyperglycemia episodes, reviewing his current and previous labs/studies (including abstraction from other facilities) and medications doses and developing a long term treatment plan based on the latest standards of care/guidelines; and documenting his care.    Please refer to Patient Instructions for Blood Glucose Monitoring and Insulin /Medications Dosing Guide in media tab for additional information. Please also refer to Patient Self Inventory in the Media tab for reviewed elements of pertinent patient history.  Elmar Gupta participated in the discussions, expressed understanding, and voiced agreement with the above plans.  All questions were answered to his satisfaction. he is encouraged to contact clinic should he have any questions or concerns prior to his return visit.     Follow up plan: - Return in about 3 months (around 07/17/2024) for Diabetes F/U with A1c in office, No previsit labs, Bring meter and logs.    Benton Rio, [facility] [address] [place], [place]  [address] Phone: [phone] Fax: [phone]  68/14/2025, 7:02 AM

## 2024-04-21 ENCOUNTER — Ambulatory Visit: Admitting: Nurse Practitioner

## 2024-04-27 DIAGNOSIS — Z8639 Personal history of other endocrine, nutritional and metabolic disease: Secondary | ICD-10-CM | POA: Diagnosis not present

## 2024-04-27 DIAGNOSIS — J019 Acute sinusitis, unspecified: Secondary | ICD-10-CM | POA: Diagnosis not present

## 2024-04-27 DIAGNOSIS — B9789 Other viral agents as the cause of diseases classified elsewhere: Secondary | ICD-10-CM | POA: Diagnosis not present

## 2024-05-01 DIAGNOSIS — J209 Acute bronchitis, unspecified: Secondary | ICD-10-CM | POA: Diagnosis not present

## 2024-05-01 DIAGNOSIS — R051 Acute cough: Secondary | ICD-10-CM | POA: Diagnosis not present

## 2024-05-04 DIAGNOSIS — E1122 Type 2 diabetes mellitus with diabetic chronic kidney disease: Secondary | ICD-10-CM | POA: Diagnosis not present

## 2024-05-04 DIAGNOSIS — M47812 Spondylosis without myelopathy or radiculopathy, cervical region: Secondary | ICD-10-CM | POA: Diagnosis not present

## 2024-05-04 DIAGNOSIS — R739 Hyperglycemia, unspecified: Secondary | ICD-10-CM | POA: Diagnosis not present

## 2024-05-04 DIAGNOSIS — I129 Hypertensive chronic kidney disease with stage 1 through stage 4 chronic kidney disease, or unspecified chronic kidney disease: Secondary | ICD-10-CM | POA: Diagnosis not present

## 2024-05-04 DIAGNOSIS — E1165 Type 2 diabetes mellitus with hyperglycemia: Secondary | ICD-10-CM | POA: Diagnosis not present

## 2024-05-04 DIAGNOSIS — M542 Cervicalgia: Secondary | ICD-10-CM | POA: Diagnosis not present

## 2024-05-04 DIAGNOSIS — I959 Hypotension, unspecified: Secondary | ICD-10-CM | POA: Diagnosis not present

## 2024-05-04 DIAGNOSIS — N184 Chronic kidney disease, stage 4 (severe): Secondary | ICD-10-CM | POA: Diagnosis not present

## 2024-05-04 DIAGNOSIS — Z794 Long term (current) use of insulin: Secondary | ICD-10-CM | POA: Diagnosis not present

## 2024-05-04 DIAGNOSIS — R42 Dizziness and giddiness: Secondary | ICD-10-CM | POA: Diagnosis not present

## 2024-05-04 DIAGNOSIS — R519 Headache, unspecified: Secondary | ICD-10-CM | POA: Diagnosis not present

## 2024-05-06 NOTE — Progress Notes (Signed)
 Philip Richardson                                          MRN: [id number]   05/06/2024   The [facility] Quality Team Specialist reviewed this patient medical record for the purposes of chart review for care gap closure. The following were reviewed: chart review for care gap closure-glycemic status assessment.    [facility] Quality Team

## 2024-05-14 ENCOUNTER — Inpatient Hospital Stay: Admitting: Nurse Practitioner

## 2024-05-14 ENCOUNTER — Encounter: Payer: Self-pay | Admitting: Nurse Practitioner

## 2024-05-14 ENCOUNTER — Ambulatory Visit: Admitting: Nurse Practitioner

## 2024-05-14 VITALS — BP 133/72 | HR 89 | Temp 97.0°F | Ht 73.0 in | Wt 186.0 lb

## 2024-05-14 DIAGNOSIS — J0100 Acute maxillary sinusitis, unspecified: Secondary | ICD-10-CM

## 2024-05-14 MED ORDER — AMOXICILLIN-POT CLAVULANATE 875-125 MG PO TABS: 1.0000 | ORAL_TABLET | Freq: Two times a day (BID) | ORAL | 0 refills | Status: DC

## 2024-05-14 NOTE — Patient Instructions (Signed)
 1. Take meds as prescribed 2. Use a cool mist humidifier especially during the winter months and when heat has been humid. 3. Use saline nose sprays frequently 4. Saline irrigations of the nose can be very helpful if done frequently.  * 4X daily for 1 week*  * Use of a nettie pot can be helpful with this. Follow directions with this* 5. Drink plenty of fluids 6. Keep thermostat turn down low 7.For any cough or congestion- robitussin DM 8. For fever or aces or pains- take tylenol or ibuprofen appropriate for age and weight.  * for fevers greater than 101 orally you may alternate ibuprofen and tylenol every  3 hours.

## 2024-05-14 NOTE — Progress Notes (Signed)
 Subjective:    Patient ID: Philip Richardson, male    DOB: November 22, 1955, 68 y.o.   MRN: [id number]   Chief Complaint: hospital follow up  HPI  Patient went to the ED on 05/04/24 with headache and neck pain. All scans were negative. He was discharged home on tylenol  as needed Ice and stretches. Since going home still has sinus headache with facial pressure. They put him on steroids and gave him depo shot before leaving. Patient Active Problem List   Diagnosis Date Noted   CKD (chronic kidney disease) stage 5, GFR less than 15 ml/min (HCC) 06/05/2022   Drug-induced myopathy 03/14/2020   Normocytic anemia 10/09/2019   Elevated LFTs    GERD (gastroesophageal reflux disease) 07/16/2019   Hyperkalemia 07/16/2019   Constipation 07/16/2019   Hyperlipidemia associated with type 2 diabetes mellitus (HCC) 02/01/2015   Staghorn kidney stones 12/09/2013   Hypertension 10/13/2013   Diabetes mellitus treated with insulin  and oral medication (HCC) 09/10/2013       Review of Systems  Constitutional:  Negative for chills and fever.  HENT:  Positive for congestion and sinus pressure.   Respiratory:  Positive for cough.        Objective:   Physical Exam Constitutional:      Appearance: Normal appearance.  Cardiovascular:     Rate and Rhythm: Normal rate and regular rhythm.     Heart sounds: Normal heart sounds.  Pulmonary:     Effort: Pulmonary effort is normal.     Breath sounds: Normal breath sounds.  Skin:    General: Skin is warm and dry.  Neurological:     General: No focal deficit present.     Mental Status: He is alert and oriented to person, place, and time.  Psychiatric:        Mood and Affect: Mood normal.        Behavior: Behavior normal.    BP 133/72   Pulse 89   Temp (!) 97 F (36.1 C) (Temporal)   Ht 6' 1 (1.854 m)   Wt 186 lb (84.4 kg)   SpO2 99%   BMI 24.54 kg/m         Assessment & Plan:   Philip Richardson in today with chief complaint of No chief  complaint on file.   1. Acute non-recurrent maxillary sinusitis (Primary) 1. Take meds as prescribed 2. Use a cool mist humidifier especially during the winter months and when heat has been humid. 3. Use saline nose sprays frequently 4. Saline irrigations of the nose can be very helpful if done frequently.  * 4X daily for 1 week*  * Use of a nettie pot can be helpful with this. Follow directions with this* 5. Drink plenty of fluids 6. Keep thermostat turn down low 7.For any cough or congestion- robitussin DM 8. For fever or aces or pains- take tylenol  or ibuprofen appropriate for age and weight.  * for fevers greater than 101 orally you may alternate ibuprofen and tylenol  every  3 hours.    Meds ordered this encounter  Medications   amoxicillin -clavulanate (AUGMENTIN ) 875-125 MG tablet    Sig: Take 1 tablet by mouth 2 (two) times daily.    Dispense:  14 tablet    Refill:  0    Supervising Provider:   MARYANNE CHEW A [[id number]]      The above assessment and management plan was discussed with the patient. The patient verbalized understanding of and has agreed to the management plan. Patient  is aware to call the clinic if symptoms persist or worsen. Patient is aware when to return to the clinic for a follow-up visit. Patient educated on when it is appropriate to go to the emergency department.   Mary-Margaret Gladis, FNP

## 2024-05-21 ENCOUNTER — Telehealth (HOSPITAL_COMMUNITY): Payer: Self-pay | Admitting: Pharmacy Technician

## 2024-05-21 ENCOUNTER — Other Ambulatory Visit (HOSPITAL_COMMUNITY): Payer: Self-pay

## 2024-05-21 ENCOUNTER — Other Ambulatory Visit: Payer: Self-pay

## 2024-05-21 ENCOUNTER — Inpatient Hospital Stay (HOSPITAL_COMMUNITY)

## 2024-05-21 ENCOUNTER — Encounter (HOSPITAL_COMMUNITY): Admission: EM | Disposition: E | Payer: Self-pay | Source: Home / Self Care | Attending: Internal Medicine

## 2024-05-21 ENCOUNTER — Inpatient Hospital Stay (HOSPITAL_COMMUNITY)
Admission: EM | Admit: 2024-05-21 | Discharge: 2024-06-04 | DRG: 001 | Disposition: E | Attending: Internal Medicine | Admitting: Internal Medicine

## 2024-05-21 ENCOUNTER — Encounter (HOSPITAL_COMMUNITY): Payer: Self-pay | Admitting: Cardiovascular Disease

## 2024-05-21 DIAGNOSIS — E871 Hypo-osmolality and hyponatremia: Secondary | ICD-10-CM | POA: Diagnosis not present

## 2024-05-21 DIAGNOSIS — K219 Gastro-esophageal reflux disease without esophagitis: Secondary | ICD-10-CM | POA: Diagnosis present

## 2024-05-21 DIAGNOSIS — R4701 Aphasia: Secondary | ICD-10-CM | POA: Diagnosis not present

## 2024-05-21 DIAGNOSIS — Z860100 Personal history of colon polyps, unspecified: Secondary | ICD-10-CM

## 2024-05-21 DIAGNOSIS — I480 Paroxysmal atrial fibrillation: Secondary | ICD-10-CM | POA: Diagnosis present

## 2024-05-21 DIAGNOSIS — Z794 Long term (current) use of insulin: Secondary | ICD-10-CM

## 2024-05-21 DIAGNOSIS — E782 Mixed hyperlipidemia: Secondary | ICD-10-CM | POA: Diagnosis present

## 2024-05-21 DIAGNOSIS — I5082 Biventricular heart failure: Secondary | ICD-10-CM | POA: Diagnosis not present

## 2024-05-21 DIAGNOSIS — I5021 Acute systolic (congestive) heart failure: Secondary | ICD-10-CM | POA: Diagnosis present

## 2024-05-21 DIAGNOSIS — D62 Acute posthemorrhagic anemia: Secondary | ICD-10-CM | POA: Diagnosis not present

## 2024-05-21 DIAGNOSIS — Z515 Encounter for palliative care: Secondary | ICD-10-CM | POA: Diagnosis not present

## 2024-05-21 DIAGNOSIS — Z8673 Personal history of transient ischemic attack (TIA), and cerebral infarction without residual deficits: Secondary | ICD-10-CM

## 2024-05-21 DIAGNOSIS — I255 Ischemic cardiomyopathy: Secondary | ICD-10-CM | POA: Diagnosis present

## 2024-05-21 DIAGNOSIS — I959 Hypotension, unspecified: Secondary | ICD-10-CM | POA: Diagnosis not present

## 2024-05-21 DIAGNOSIS — I120 Hypertensive chronic kidney disease with stage 5 chronic kidney disease or end stage renal disease: Secondary | ICD-10-CM | POA: Diagnosis not present

## 2024-05-21 DIAGNOSIS — G928 Other toxic encephalopathy: Secondary | ICD-10-CM | POA: Diagnosis not present

## 2024-05-21 DIAGNOSIS — N170 Acute kidney failure with tubular necrosis: Secondary | ICD-10-CM | POA: Diagnosis not present

## 2024-05-21 DIAGNOSIS — I7090 Unspecified atherosclerosis: Secondary | ICD-10-CM | POA: Diagnosis not present

## 2024-05-21 DIAGNOSIS — E1110 Type 2 diabetes mellitus with ketoacidosis without coma: Secondary | ICD-10-CM | POA: Diagnosis not present

## 2024-05-21 DIAGNOSIS — R29810 Facial weakness: Secondary | ICD-10-CM | POA: Diagnosis not present

## 2024-05-21 DIAGNOSIS — N183 Chronic kidney disease, stage 3 unspecified: Secondary | ICD-10-CM | POA: Diagnosis not present

## 2024-05-21 DIAGNOSIS — I213 ST elevation (STEMI) myocardial infarction of unspecified site: Secondary | ICD-10-CM | POA: Diagnosis not present

## 2024-05-21 DIAGNOSIS — Z7189 Other specified counseling: Secondary | ICD-10-CM | POA: Diagnosis not present

## 2024-05-21 DIAGNOSIS — T82524A Displacement of infusion catheter, initial encounter: Secondary | ICD-10-CM | POA: Diagnosis not present

## 2024-05-21 DIAGNOSIS — N1832 Chronic kidney disease, stage 3b: Secondary | ICD-10-CM | POA: Diagnosis present

## 2024-05-21 DIAGNOSIS — F32A Depression, unspecified: Secondary | ICD-10-CM | POA: Diagnosis present

## 2024-05-21 DIAGNOSIS — E873 Alkalosis: Secondary | ICD-10-CM | POA: Diagnosis not present

## 2024-05-21 DIAGNOSIS — Z66 Do not resuscitate: Secondary | ICD-10-CM | POA: Diagnosis not present

## 2024-05-21 DIAGNOSIS — I4891 Unspecified atrial fibrillation: Secondary | ICD-10-CM | POA: Diagnosis not present

## 2024-05-21 DIAGNOSIS — I2102 ST elevation (STEMI) myocardial infarction involving left anterior descending coronary artery: Secondary | ICD-10-CM

## 2024-05-21 DIAGNOSIS — R531 Weakness: Secondary | ICD-10-CM | POA: Diagnosis not present

## 2024-05-21 DIAGNOSIS — R130 Aphagia: Secondary | ICD-10-CM | POA: Diagnosis not present

## 2024-05-21 DIAGNOSIS — H518 Other specified disorders of binocular movement: Secondary | ICD-10-CM | POA: Diagnosis not present

## 2024-05-21 DIAGNOSIS — R059 Cough, unspecified: Secondary | ICD-10-CM | POA: Diagnosis present

## 2024-05-21 DIAGNOSIS — I130 Hypertensive heart and chronic kidney disease with heart failure and stage 1 through stage 4 chronic kidney disease, or unspecified chronic kidney disease: Secondary | ICD-10-CM | POA: Diagnosis present

## 2024-05-21 DIAGNOSIS — I241 Dressler's syndrome: Secondary | ICD-10-CM | POA: Diagnosis not present

## 2024-05-21 DIAGNOSIS — I672 Cerebral atherosclerosis: Secondary | ICD-10-CM | POA: Diagnosis not present

## 2024-05-21 DIAGNOSIS — E11649 Type 2 diabetes mellitus with hypoglycemia without coma: Secondary | ICD-10-CM | POA: Diagnosis not present

## 2024-05-21 DIAGNOSIS — Z7982 Long term (current) use of aspirin: Secondary | ICD-10-CM

## 2024-05-21 DIAGNOSIS — I219 Acute myocardial infarction, unspecified: Secondary | ICD-10-CM | POA: Diagnosis not present

## 2024-05-21 DIAGNOSIS — F05 Delirium due to known physiological condition: Secondary | ICD-10-CM | POA: Diagnosis not present

## 2024-05-21 DIAGNOSIS — K7200 Acute and subacute hepatic failure without coma: Secondary | ICD-10-CM | POA: Diagnosis not present

## 2024-05-21 DIAGNOSIS — R350 Frequency of micturition: Secondary | ICD-10-CM | POA: Diagnosis present

## 2024-05-21 DIAGNOSIS — R570 Cardiogenic shock: Secondary | ICD-10-CM | POA: Diagnosis not present

## 2024-05-21 DIAGNOSIS — Z888 Allergy status to other drugs, medicaments and biological substances status: Secondary | ICD-10-CM

## 2024-05-21 DIAGNOSIS — D696 Thrombocytopenia, unspecified: Secondary | ICD-10-CM | POA: Diagnosis not present

## 2024-05-21 DIAGNOSIS — Q631 Lobulated, fused and horseshoe kidney: Secondary | ICD-10-CM

## 2024-05-21 DIAGNOSIS — N179 Acute kidney failure, unspecified: Secondary | ICD-10-CM | POA: Diagnosis not present

## 2024-05-21 DIAGNOSIS — G9389 Other specified disorders of brain: Secondary | ICD-10-CM | POA: Diagnosis not present

## 2024-05-21 DIAGNOSIS — R410 Disorientation, unspecified: Secondary | ICD-10-CM | POA: Diagnosis not present

## 2024-05-21 DIAGNOSIS — Z9911 Dependence on respirator [ventilator] status: Secondary | ICD-10-CM

## 2024-05-21 DIAGNOSIS — I236 Thrombosis of atrium, auricular appendage, and ventricle as current complications following acute myocardial infarction: Secondary | ICD-10-CM | POA: Diagnosis not present

## 2024-05-21 DIAGNOSIS — J9601 Acute respiratory failure with hypoxia: Secondary | ICD-10-CM | POA: Diagnosis not present

## 2024-05-21 DIAGNOSIS — R6510 Systemic inflammatory response syndrome (SIRS) of non-infectious origin without acute organ dysfunction: Secondary | ICD-10-CM | POA: Diagnosis not present

## 2024-05-21 DIAGNOSIS — R29713 NIHSS score 13: Secondary | ICD-10-CM | POA: Diagnosis not present

## 2024-05-21 DIAGNOSIS — I639 Cerebral infarction, unspecified: Secondary | ICD-10-CM | POA: Diagnosis not present

## 2024-05-21 DIAGNOSIS — Z79899 Other long term (current) drug therapy: Secondary | ICD-10-CM

## 2024-05-21 DIAGNOSIS — I3139 Other pericardial effusion (noninflammatory): Secondary | ICD-10-CM | POA: Diagnosis present

## 2024-05-21 DIAGNOSIS — Z955 Presence of coronary angioplasty implant and graft: Secondary | ICD-10-CM | POA: Diagnosis not present

## 2024-05-21 DIAGNOSIS — N185 Chronic kidney disease, stage 5: Secondary | ICD-10-CM | POA: Diagnosis not present

## 2024-05-21 DIAGNOSIS — I2510 Atherosclerotic heart disease of native coronary artery without angina pectoris: Secondary | ICD-10-CM | POA: Diagnosis not present

## 2024-05-21 DIAGNOSIS — I2109 ST elevation (STEMI) myocardial infarction involving other coronary artery of anterior wall: Secondary | ICD-10-CM | POA: Diagnosis not present

## 2024-05-21 DIAGNOSIS — Z7984 Long term (current) use of oral hypoglycemic drugs: Secondary | ICD-10-CM

## 2024-05-21 DIAGNOSIS — I429 Cardiomyopathy, unspecified: Secondary | ICD-10-CM | POA: Diagnosis not present

## 2024-05-21 DIAGNOSIS — J810 Acute pulmonary edema: Secondary | ICD-10-CM | POA: Diagnosis not present

## 2024-05-21 DIAGNOSIS — I25119 Atherosclerotic heart disease of native coronary artery with unspecified angina pectoris: Secondary | ICD-10-CM | POA: Diagnosis not present

## 2024-05-21 DIAGNOSIS — Z95811 Presence of heart assist device: Secondary | ICD-10-CM | POA: Diagnosis not present

## 2024-05-21 DIAGNOSIS — R451 Restlessness and agitation: Secondary | ICD-10-CM | POA: Diagnosis not present

## 2024-05-21 DIAGNOSIS — E1122 Type 2 diabetes mellitus with diabetic chronic kidney disease: Secondary | ICD-10-CM | POA: Diagnosis present

## 2024-05-21 DIAGNOSIS — R29818 Other symptoms and signs involving the nervous system: Secondary | ICD-10-CM | POA: Diagnosis not present

## 2024-05-21 DIAGNOSIS — I214 Non-ST elevation (NSTEMI) myocardial infarction: Secondary | ICD-10-CM | POA: Diagnosis not present

## 2024-05-21 DIAGNOSIS — Z87442 Personal history of urinary calculi: Secondary | ICD-10-CM

## 2024-05-21 HISTORY — PX: CORONARY/GRAFT ACUTE MI REVASCULARIZATION: CATH118305

## 2024-05-21 HISTORY — PX: LEFT HEART CATH AND CORONARY ANGIOGRAPHY: CATH118249

## 2024-05-21 LAB — ECHOCARDIOGRAM COMPLETE
Area-P 1/2: 5.06 cm2
Calc EF: 53.3 %
S' Lateral: 3.6 cm
Single Plane A2C EF: 60.3 %
Single Plane A4C EF: 46.5 %

## 2024-05-21 LAB — COMPREHENSIVE METABOLIC PANEL WITH GFR
ALT: 52 U/L — ABNORMAL HIGH (ref 0–44)
ALT: 58 U/L — ABNORMAL HIGH (ref 0–44)
AST: 142 U/L — ABNORMAL HIGH (ref 15–41)
AST: 254 U/L — ABNORMAL HIGH (ref 15–41)
Albumin: 3.3 g/dL — ABNORMAL LOW (ref 3.5–5.0)
Albumin: 3.5 g/dL (ref 3.5–5.0)
Alkaline Phosphatase: 121 U/L (ref 38–126)
Alkaline Phosphatase: 125 U/L (ref 38–126)
Anion gap: 12 (ref 5–15)
Anion gap: 14 (ref 5–15)
BUN: 37 mg/dL — ABNORMAL HIGH (ref 8–23)
BUN: 36 mg/dL — ABNORMAL HIGH (ref 8–23)
CO2: 24 mmol/L (ref 22–32)
CO2: 23 mmol/L (ref 22–32)
Calcium: 9 mg/dL (ref 8.9–10.3)
Calcium: 9.4 mg/dL (ref 8.9–10.3)
Chloride: 96 mmol/L — ABNORMAL LOW (ref 98–111)
Chloride: 95 mmol/L — ABNORMAL LOW (ref 98–111)
Creatinine, Ser: 2.17 mg/dL — ABNORMAL HIGH (ref 0.61–1.24)
Creatinine, Ser: 2.32 mg/dL — ABNORMAL HIGH (ref 0.61–1.24)
GFR, Estimated: 32 mL/min — ABNORMAL LOW (ref >60)
GFR, Estimated: 30 mL/min — ABNORMAL LOW (ref >60)
Glucose, Bld: 187 mg/dL — ABNORMAL HIGH (ref 70–99)
Glucose, Bld: 116 mg/dL — ABNORMAL HIGH (ref 70–99)
Potassium: 4.1 mmol/L (ref 3.5–5.1)
Potassium: 4.8 mmol/L (ref 3.5–5.1)
Sodium: 131 mmol/L — ABNORMAL LOW (ref 135–145)
Sodium: 132 mmol/L — ABNORMAL LOW (ref 135–145)
Total Bilirubin: 0.5 mg/dL (ref 0.0–1.2)
Total Bilirubin: 0.8 mg/dL (ref 0.0–1.2)
Total Protein: 6 g/dL — ABNORMAL LOW (ref 6.5–8.1)
Total Protein: 6.5 g/dL (ref 6.5–8.1)

## 2024-05-21 LAB — POCT I-STAT, CHEM 8
BUN: 33 mg/dL — ABNORMAL HIGH (ref 8–23)
Calcium, Ion: 1.2 mmol/L (ref 1.15–1.40)
Chloride: 97 mmol/L — ABNORMAL LOW (ref 98–111)
Creatinine, Ser: 2.2 mg/dL — ABNORMAL HIGH (ref 0.61–1.24)
Glucose, Bld: 192 mg/dL — ABNORMAL HIGH (ref 70–99)
HCT: 34 % — ABNORMAL LOW (ref 39.0–52.0)
Hemoglobin: 11.6 g/dL — ABNORMAL LOW (ref 13.0–17.0)
Potassium: 4 mmol/L (ref 3.5–5.1)
Sodium: 131 mmol/L — ABNORMAL LOW (ref 135–145)
TCO2: 22 mmol/L (ref 22–32)

## 2024-05-21 LAB — CBC WITH DIFFERENTIAL/PLATELET
Abs Immature Granulocytes: 0.03 K/uL (ref 0.00–0.07)
Basophils Absolute: 0 K/uL (ref 0.0–0.1)
Basophils Relative: 1 %
Eosinophils Absolute: 0.1 K/uL (ref 0.0–0.5)
Eosinophils Relative: 1 %
HCT: 34.1 % — ABNORMAL LOW (ref 39.0–52.0)
Hemoglobin: 11.4 g/dL — ABNORMAL LOW (ref 13.0–17.0)
Immature Granulocytes: 0 %
Lymphocytes Relative: 15 %
Lymphs Abs: 1.3 K/uL (ref 0.7–4.0)
MCH: 27.6 pg (ref 26.0–34.0)
MCHC: 33.4 g/dL (ref 30.0–36.0)
MCV: 82.6 fL (ref 80.0–100.0)
Monocytes Absolute: 1.1 K/uL — ABNORMAL HIGH (ref 0.1–1.0)
Monocytes Relative: 12 %
Neutro Abs: 6.4 K/uL (ref 1.7–7.7)
Neutrophils Relative %: 71 %
Platelets: 203 K/uL (ref 150–400)
RBC: 4.13 MIL/uL — ABNORMAL LOW (ref 4.22–5.81)
RDW: 13.6 % (ref 11.5–15.5)
WBC: 8.8 K/uL (ref 4.0–10.5)
nRBC: 0 % (ref 0.0–0.2)

## 2024-05-21 LAB — CG4 I-STAT (LACTIC ACID): Lactic Acid, Venous: 0.6 mmol/L (ref 0.5–1.9)

## 2024-05-21 LAB — LIPID PANEL
Cholesterol: 154 mg/dL (ref 0–200)
HDL: 35 mg/dL — ABNORMAL LOW (ref >40)
LDL Cholesterol: 97 mg/dL (ref 0–99)
Total CHOL/HDL Ratio: 4.5 ratio
Triglycerides: 114 mg/dL (ref <150)
VLDL: 23 mg/dL (ref 0–40)

## 2024-05-21 LAB — POCT ACTIVATED CLOTTING TIME
Activated Clotting Time: 220 s
Activated Clotting Time: 276 s
Activated Clotting Time: 291 s

## 2024-05-21 LAB — COOXEMETRY PANEL
Carboxyhemoglobin: 1.5 % (ref 0.5–1.5)
Methemoglobin: <0.7 % (ref 0.0–1.5)
O2 Saturation: 61.8 %
Total hemoglobin: 12.6 g/dL (ref 12.0–16.0)

## 2024-05-21 LAB — GLUCOSE, CAPILLARY
Glucose-Capillary: 147 mg/dL — ABNORMAL HIGH (ref 70–99)
Glucose-Capillary: 120 mg/dL — ABNORMAL HIGH (ref 70–99)
Glucose-Capillary: 105 mg/dL — ABNORMAL HIGH (ref 70–99)

## 2024-05-21 LAB — LACTIC ACID, PLASMA
Lactic Acid, Venous: 2.3 mmol/L (ref 0.5–1.9)
Lactic Acid, Venous: 1.2 mmol/L (ref 0.5–1.9)
Lactic Acid, Venous: 2.8 mmol/L (ref 0.5–1.9)

## 2024-05-21 LAB — HEMOGLOBIN A1C
Hgb A1c MFr Bld: 13.1 % — ABNORMAL HIGH (ref 4.8–5.6)
Mean Plasma Glucose: 329.27 mg/dL

## 2024-05-21 LAB — TROPONIN T, HIGH SENSITIVITY
Troponin T High Sensitivity: 4926 ng/L (ref 0–19)
Troponin T High Sensitivity: >10000 ng/L (ref 0–19)
Troponin T High Sensitivity: >10000 ng/L (ref 0–19)

## 2024-05-21 LAB — APTT: aPTT: >200 s (ref 24–36)

## 2024-05-21 LAB — HIV ANTIBODY (ROUTINE TESTING W REFLEX): HIV Screen 4th Generation wRfx: NONREACTIVE

## 2024-05-21 LAB — PROTIME-INR
INR: 1.4 — ABNORMAL HIGH (ref 0.8–1.2)
Prothrombin Time: 18.2 s — ABNORMAL HIGH (ref 11.4–15.2)

## 2024-05-21 LAB — MRSA NEXT GEN BY PCR, NASAL: MRSA by PCR Next Gen: NOT DETECTED

## 2024-05-21 LAB — PRO BRAIN NATRIURETIC PEPTIDE: Pro Brain Natriuretic Peptide: 18891 pg/mL — ABNORMAL HIGH (ref <300.0)

## 2024-05-21 MED ORDER — BENZONATATE 100 MG PO CAPS
200.0000 mg | ORAL_CAPSULE | Freq: Two times a day (BID) | ORAL | Status: DC
  Administered 2024-05-21 – 2024-05-22 (×3): 200 mg via ORAL
  Filled 2024-05-21 (×4): qty 2

## 2024-05-21 MED ORDER — SODIUM CHLORIDE 0.9 % IV SOLN: 250.0000 mL | INTRAVENOUS | Status: AC | PRN

## 2024-05-21 MED ORDER — GUAIFENESIN-DM 100-10 MG/5ML PO SYRP
5.0000 mL | ORAL_SOLUTION | ORAL | Status: DC | PRN
  Administered 2024-05-21 – 2024-05-24 (×5): 5 mL via ORAL
  Filled 2024-05-21 (×7): qty 5

## 2024-05-21 MED ORDER — LABETALOL HCL 5 MG/ML IV SOLN: 10.0000 mg | INTRAVENOUS | Status: DC | PRN

## 2024-05-21 MED ORDER — NITROGLYCERIN 0.4 MG SL SUBL
0.4000 mg | SUBLINGUAL_TABLET | SUBLINGUAL | Status: DC | PRN
  Administered 2024-05-22: 0.4 mg via SUBLINGUAL
  Filled 2024-05-21: qty 1

## 2024-05-21 MED ORDER — SODIUM CHLORIDE 0.9% FLUSH
10.0000 mL | Freq: Two times a day (BID) | INTRAVENOUS | Status: DC
  Administered 2024-05-21 – 2024-05-23 (×3): 10 mL
  Administered 2024-05-23: 40 mL
  Administered 2024-05-24: 10 mL
  Administered 2024-05-24: 40 mL
  Administered 2024-05-25 – 2024-05-27 (×6): 10 mL
  Administered 2024-05-28: 20 mL
  Administered 2024-05-29: 30 mL
  Administered 2024-05-29 – 2024-05-30 (×2): 10 mL

## 2024-05-21 MED ORDER — MIDAZOLAM HCL 2 MG/2ML IJ SOLN
INTRAMUSCULAR | Status: AC
  Filled 2024-05-21: qty 2

## 2024-05-21 MED ORDER — INSULIN ASPART 100 UNIT/ML IJ SOLN
0.0000 [IU] | Freq: Three times a day (TID) | INTRAMUSCULAR | Status: DC
  Administered 2024-05-21 – 2024-05-22 (×2): 2 [IU] via SUBCUTANEOUS
  Filled 2024-05-21: qty 3
  Filled 2024-05-21 (×2): qty 2

## 2024-05-21 MED ORDER — LIDOCAINE HCL (PF) 1 % IJ SOLN
INTRAMUSCULAR | Status: DC | PRN
  Administered 2024-05-21: 09:00:00 2 mL

## 2024-05-21 MED ORDER — HYDRALAZINE HCL 20 MG/ML IJ SOLN: 10.0000 mg | INTRAMUSCULAR | Status: DC | PRN

## 2024-05-21 MED ORDER — CHLORHEXIDINE GLUCONATE CLOTH 2 % EX PADS
6.0000 | MEDICATED_PAD | Freq: Every day | CUTANEOUS | Status: DC
  Administered 2024-05-21 – 2024-05-30 (×8): 6 via TOPICAL

## 2024-05-21 MED ORDER — FAMOTIDINE 20 MG PO TABS
20.0000 mg | ORAL_TABLET | Freq: Every day | ORAL | Status: DC
  Administered 2024-05-21 – 2024-05-22 (×2): 20 mg via ORAL
  Filled 2024-05-21 (×2): qty 1

## 2024-05-21 MED ORDER — HEPARIN SODIUM (PORCINE) 1000 UNIT/ML IJ SOLN
INTRAMUSCULAR | Status: AC
  Filled 2024-05-21: qty 10

## 2024-05-21 MED ORDER — SODIUM CHLORIDE 0.9% FLUSH: 10.0000 mL | INTRAVENOUS | Status: DC | PRN

## 2024-05-21 MED ORDER — LIDOCAINE HCL (PF) 1 % IJ SOLN
INTRAMUSCULAR | Status: AC
  Filled 2024-05-21: qty 30

## 2024-05-21 MED ORDER — MORPHINE SULFATE (PF) 2 MG/ML IV SOLN
2.0000 mg | INTRAVENOUS | Status: DC | PRN
  Administered 2024-05-21 (×4): 2 mg via INTRAVENOUS
  Filled 2024-05-21 (×4): qty 1

## 2024-05-21 MED ORDER — INSULIN GLARGINE 100 UNIT/ML ~~LOC~~ SOLN
50.0000 [IU] | Freq: Every day | SUBCUTANEOUS | Status: DC
  Administered 2024-05-21 – 2024-05-23 (×3): 50 [IU] via SUBCUTANEOUS
  Filled 2024-05-21 (×3): qty 0.5

## 2024-05-21 MED ORDER — ACETAMINOPHEN 325 MG PO TABS
650.0000 mg | ORAL_TABLET | ORAL | Status: DC | PRN
  Administered 2024-05-21 – 2024-05-22 (×2): 650 mg via ORAL
  Filled 2024-05-21 (×2): qty 2

## 2024-05-21 MED ORDER — ONDANSETRON HCL 4 MG/2ML IJ SOLN: 4.0000 mg | Freq: Four times a day (QID) | INTRAMUSCULAR | Status: DC | PRN

## 2024-05-21 MED ORDER — FENTANYL CITRATE (PF) 100 MCG/2ML IJ SOLN
INTRAMUSCULAR | Status: AC
  Filled 2024-05-21: qty 2

## 2024-05-21 MED ORDER — ORAL CARE MOUTH RINSE: 15.0000 mL | OROMUCOSAL | Status: DC | PRN

## 2024-05-21 MED ORDER — VERAPAMIL HCL 2.5 MG/ML IV SOLN
INTRAVENOUS | Status: AC
  Filled 2024-05-21: qty 2

## 2024-05-21 MED ORDER — COLCHICINE 0.6 MG PO TABS
0.6000 mg | ORAL_TABLET | Freq: Every day | ORAL | Status: DC
  Administered 2024-05-21 – 2024-05-22 (×2): 0.6 mg via ORAL
  Filled 2024-05-21 (×2): qty 1

## 2024-05-21 MED ORDER — HEPARIN SODIUM (PORCINE) 1000 UNIT/ML IJ SOLN
INTRAMUSCULAR | Status: DC | PRN
  Administered 2024-05-21: 09:00:00 5000 [IU] via INTRAVENOUS
  Administered 2024-05-21: 09:00:00 8000 [IU] via INTRAVENOUS
  Administered 2024-05-21: 09:00:00 3000 [IU] via INTRAVENOUS

## 2024-05-21 MED ORDER — POTASSIUM CHLORIDE CRYS ER 20 MEQ PO TBCR
20.0000 meq | EXTENDED_RELEASE_TABLET | Freq: Once | ORAL | Status: AC
  Administered 2024-05-21: 11:00:00 20 meq via ORAL
  Filled 2024-05-21: qty 1

## 2024-05-21 MED ORDER — TICAGRELOR 90 MG PO TABS
ORAL_TABLET | ORAL | Status: AC
  Filled 2024-05-21: qty 2

## 2024-05-21 MED ORDER — PHENOL 1.4 % MT LIQD
1.0000 | OROMUCOSAL | Status: DC | PRN
  Filled 2024-05-21: qty 177

## 2024-05-21 MED ORDER — FENTANYL CITRATE (PF) 100 MCG/2ML IJ SOLN
INTRAMUSCULAR | Status: DC | PRN
  Administered 2024-05-21 (×2): 25 ug via INTRAVENOUS

## 2024-05-21 MED ORDER — VERAPAMIL HCL 2.5 MG/ML IV SOLN
INTRAVENOUS | Status: DC | PRN
  Administered 2024-05-21: 09:00:00 10 mL via INTRA_ARTERIAL

## 2024-05-21 MED ORDER — SODIUM CHLORIDE 0.9% FLUSH
3.0000 mL | Freq: Two times a day (BID) | INTRAVENOUS | Status: DC
  Administered 2024-05-21 – 2024-05-24 (×5): 3 mL via INTRAVENOUS

## 2024-05-21 MED ORDER — HEPARIN SODIUM (PORCINE) 5000 UNIT/ML IJ SOLN
5000.0000 [IU] | Freq: Three times a day (TID) | INTRAMUSCULAR | Status: DC
  Administered 2024-05-22: 5000 [IU] via SUBCUTANEOUS
  Filled 2024-05-21: qty 1

## 2024-05-21 MED ORDER — FUROSEMIDE 10 MG/ML IJ SOLN: 80.0000 mg | Freq: Two times a day (BID) | INTRAMUSCULAR | Status: DC

## 2024-05-21 MED ORDER — TICAGRELOR 90 MG PO TABS
ORAL_TABLET | ORAL | Status: DC | PRN
  Administered 2024-05-21: 09:00:00 180 mg via ORAL

## 2024-05-21 MED ORDER — ROSUVASTATIN CALCIUM 5 MG PO TABS
10.0000 mg | ORAL_TABLET | Freq: Every day | ORAL | Status: DC
  Administered 2024-05-22: 10 mg via ORAL
  Filled 2024-05-21: qty 2

## 2024-05-21 MED ORDER — HEPARIN (PORCINE) IN NACL 1000-0.9 UT/500ML-% IV SOLN
INTRAVENOUS | Status: DC | PRN
  Administered 2024-05-21 (×3): 500 mL

## 2024-05-21 MED ORDER — NITROGLYCERIN 1 MG/10 ML FOR IR/CATH LAB
INTRA_ARTERIAL | Status: DC | PRN
  Administered 2024-05-21 (×3): 100 ug via INTRACORONARY

## 2024-05-21 MED ORDER — FUROSEMIDE 10 MG/ML IJ SOLN
80.0000 mg | Freq: Two times a day (BID) | INTRAMUSCULAR | Status: DC
  Administered 2024-05-21: 11:00:00 80 mg via INTRAVENOUS
  Filled 2024-05-21: qty 8

## 2024-05-21 MED ORDER — IOHEXOL 350 MG/ML SOLN
INTRAVENOUS | Status: DC | PRN
  Administered 2024-05-21: 10:00:00 130 mL via INTRA_ARTERIAL

## 2024-05-21 MED ORDER — SODIUM CHLORIDE 0.9% FLUSH: 3.0000 mL | INTRAVENOUS | Status: DC | PRN

## 2024-05-21 MED ORDER — OXYCODONE HCL 5 MG PO TABS
5.0000 mg | ORAL_TABLET | ORAL | Status: DC | PRN
  Administered 2024-05-21 – 2024-05-23 (×3): 10 mg via ORAL
  Filled 2024-05-21 (×4): qty 2

## 2024-05-21 MED ORDER — MIDAZOLAM HCL (PF) 2 MG/2ML IJ SOLN
INTRAMUSCULAR | Status: DC | PRN
  Administered 2024-05-21: 09:00:00 1 mg via INTRAVENOUS

## 2024-05-21 MED ORDER — TICAGRELOR 90 MG PO TABS
90.0000 mg | ORAL_TABLET | Freq: Two times a day (BID) | ORAL | Status: DC
  Administered 2024-05-21 – 2024-05-22 (×2): 90 mg via ORAL
  Filled 2024-05-21 (×2): qty 1

## 2024-05-21 MED ORDER — ASPIRIN 81 MG PO TBEC
81.0000 mg | DELAYED_RELEASE_TABLET | Freq: Every day | ORAL | Status: DC
  Administered 2024-05-22 – 2024-05-23 (×2): 81 mg via ORAL
  Filled 2024-05-21 (×2): qty 1

## 2024-05-21 MED ADMIN — Perflutren Lipid Microsphere IV Susp 1.1 MG/ML: 2 mL | INTRAVENOUS | @ 11:00:00 | NDC 99999100031

## 2024-05-21 NOTE — TOC Initial Note (Signed)
 Transition of Care [facility]) - Initial/Assessment Note    Patient Details  Name: Philip Richardson MRN: [id number] Date of Birth: [birth year]  Transition of Care [facility]) CM/SW Contact:    Justina Delcia Czar, RN Phone Number: [phone] 05/21/2024, 4:46 PM  Clinical Narrative:                 Spoke to pt and SO at bedside. States he was independent pta. Pt drives to appts.  Will need HF education.   Will continue to follow for dc needs.   Expected Discharge Plan: Home/Self Care Barriers to Discharge: Continued Medical Work up   Patient Goals and CMS Choice Patient states their goals for this hospitalization and ongoing recovery are:: wants to remain independent          Expected Discharge Plan and Services   Discharge Planning Services: CM Consult   Living arrangements for the past 2 months: Single Family Home                                      Prior Living Arrangements/Services Living arrangements for the past 2 months: Single Family Home Lives with:: Self Patient language and need for interpreter reviewed:: Yes Do you feel safe going back to the place where you live?: Yes      Need for Family Participation in Patient Care: No (Comment) Care giver support system in place?: Yes (comment)   Criminal Activity/Legal Involvement Pertinent to Current Situation/Hospitalization: No - Comment as needed  Activities of Daily Living   ADL Screening (condition at time of admission) Independently performs ADLs?: Yes (appropriate for developmental age) Is the patient deaf or have difficulty hearing?: No Does the patient have difficulty seeing, even when wearing glasses/contacts?: No Does the patient have difficulty concentrating, remembering, or making decisions?: No  Permission Sought/Granted Permission sought to share information with : Case Manager, Family Supports, PCP Permission granted to share information with : Yes, Verbal Permission Granted  Share Information  with NAME: Charleen Redo  Permission granted to share info w AGENCY: PCP  Permission granted to share info w Relationship: SO  Permission granted to share info w Contact Information: [phone]  Emotional Assessment Appearance:: Appears stated age Attitude/Demeanor/Rapport: Engaged Affect (typically observed): Accepting Orientation: : Oriented to Self, Oriented to Place, Oriented to  Time, Oriented to Situation   Psych Involvement: No (comment)  Admission diagnosis:  STEMI (ST elevation myocardial infarction) (HCC) [I21.3] STEMI involving left anterior descending coronary artery (HCC) [I21.02] Patient Active Problem List   Diagnosis Date Noted   STEMI involving left anterior descending coronary artery (HCC) 05/21/2024   CKD (chronic kidney disease) stage 5, GFR less than 15 ml/min (HCC) 06/05/2022   Drug-induced myopathy 03/14/2020   Normocytic anemia 10/09/2019   Elevated LFTs    GERD (gastroesophageal reflux disease) 07/16/2019   Hyperkalemia 07/16/2019   Constipation 07/16/2019   Hyperlipidemia associated with type 2 diabetes mellitus (HCC) 02/01/2015   Staghorn kidney stones 12/09/2013   Hypertension 10/13/2013   Diabetes mellitus treated with insulin  and oral medication (HCC) 09/10/2013   PCP:  Gladis Mustard, FNP Pharmacy:   [facility] [place], [place] - [address] [place] [address] Phone: [phone] Fax: [phone]  [facility] - [place], [place] - [address] [place] [address] Phone: [phone] Fax: [phone]  Social Drivers of Health (SDOH) Social History: SDOH Screenings   Food Insecurity: No Food Insecurity (05/21/2024)  Housing: Low Risk (05/21/2024)  Transportation Needs: No Transportation Needs (05/21/2024)  Utilities: Not At Risk (05/21/2024)  Alcohol Screen: Low Risk (01/31/2024)  Depression (PHQ2-9): Low Risk (05/14/2024)  Financial Resource Strain: Low Risk  (01/31/2024)  Physical Activity: Sufficiently Active (01/31/2024)  Social Connections: Moderately Integrated (05/21/2024)  Stress: No Stress Concern Present (01/31/2024)  Tobacco Use: Low Risk (05/21/2024)  Health Literacy: Adequate Health Literacy (01/31/2024)   SDOH Interventions:     Readmission Risk Interventions     No data to display

## 2024-05-21 NOTE — Inpatient Diabetes Management (Signed)
 Inpatient Diabetes Program Recommendations  AACE/ADA: New Consensus Statement on Inpatient Glycemic Control (2015)  Target Ranges:  Prepandial:   less than 140 mg/dL      Peak postprandial:   less than 180 mg/dL (1-2 hours)      Critically ill patients:  140 - 180 mg/dL   Lab Results  Component Value Date   GLUCAP 147 (H) 05/21/2024   HGBA1C 13.1 (H) 05/21/2024    Review of Glycemic Control  Latest Reference Range & Units 05/21/24 11:54  Glucose-Capillary 70 - 99 mg/dL 852 (H)  (H): Data is abnormally high Diabetes history: Type 2 DM Outpatient Diabetes medications: Farxiga  10 mg every day, Tresiba  05 units at bedtime, Amaryl  4 mg QD Current orders for Inpatient glycemic control: Lantus  50 units at bedtime, Novolog  0-15 units TID  Inpatient Diabetes Program Recommendations:    Spoke with patient regarding outpatient diabetes management. Admits to being in denial because he lacked symptoms, however verified medications and has made several changes to lifestyle. Is followed by MICAEL Rio, outpatient endocrinology and has another appointment within a couple of weeks. Reviewed patient's current A1c of 13.1%. Explained what a A1c is and what it measures. Also reviewed goal A1c with patient, importance of good glucose control @ home, and blood sugar goals. Reviewed patho of DM, need for improved control, signs and symptoms of hypo vs hyper glycemia, interventions, when to call MD, CGM, TIR goals, impact from cardiovascular perspective, vascular changes and commorbidities.  Patient wears Dexocm CGM and uses reader. Does not have reader device with him. Reports that values are 100-120's ml/DL in am and only increases throughout the day. Last reported TIR was 7%. Patient denies taking short acting insulin  and has been hesitant in the past given shame of injections while in restaurants etc. Reviewed realistic ideas for change and goal setting. List of endocrinologist given. Feels last appointment  went poorly and that changes made to insulin  were ineffective. Patient has made some lifestyle changes that include beverages, increase of protein, and limiting sweets when possible. Denies missed doses. Also, reviewed importance of CHO mindfulness, basic carb counting and how to improve post prandial spikes.  Discussed need for possible meal coverage when discharge. Patient is open to changes and willing.  F/U 12/19.  Thanks, Tinnie Minus, MSN, RNC-OB Diabetes Coordinator [phone] (8a-5p)

## 2024-05-21 NOTE — H&P (Signed)
 Cardiology Admission History and Physical   Patient ID: Philip Richardson MRN: [id number]; DOB: May 11, 1956   Admission date: 05/21/2024  PCP:  Gladis Mustard, FNP   [facility] Providers Cardiologist:  None       Chief Complaint: Chest pain  Patient Profile: Philip Richardson is a 68 y.o. male with diabetes and chronic kidney disease who is being seen 05/21/2024 for the evaluation of chest pain/STEMI.  History of Present Illness: Philip Richardson is a 68 year old gentleman with no personal history of cardiac disease.  He developed substernal chest pain at church on Sunday.  His symptoms lasted several hours and then dissipated.  He did okay for a few days and then his pain recurred yesterday.  He describes a substernal chest pressure.  He began having more pleuritic type chest discomfort last night and had trouble sleeping due to cough and orthopnea.  He had to sit up during the night in order to breathe comfortably.  He has continued to experience pleuritic chest discomfort as well as a constant substernal discomfort.  He called EMS today due to recurrent symptoms and a code STEMI was paged out.  I met the patient in the emergency room and took his history on the way up to the cardiac catheterization lab.  He rates his pain at 7/10 at present.  He continues to complain of cough.  No diaphoresis, leg edema, heart palpitations, or presyncope.  States that he had a recent sinus infection.  No other recent health issues.  Denies any surgeries, history of stroke, or history of bleeding.  He takes insulin  and oral hypoglycemics for treatment of diabetes.   Past Medical History:  Diagnosis Date   Frequency of urination    GERD (gastroesophageal reflux disease)    Horseshoe kidney    BILATERAL   Hypertension    Renal calculus, bilateral    Type 2 diabetes mellitus (HCC)    Urgency of urination    Wears dentures    Past Surgical History:  Procedure Laterality Date   BIOPSY   04/25/2020   Procedure: BIOPSY;  Surgeon: Cindie Carlin POUR, DO;  [facility];  Service: Endoscopy;;  duodenum gastric esophagus   COLONOSCOPY WITH PROPOFOL  N/A 04/25/2020   internal hemorrhoids, one 5 mm polyp in descending colon. Tubular adenoma. 5 year surveillance.   CYSTOSCOPY W/ URETERAL STENT PLACEMENT Bilateral 12/09/2013   Procedure: CYSTOSCOPY WITH RETROGRADE PYELOGRAM/URETERAL STENT PLACEMENT;  Surgeon: Ricardo Likens, MD;  Location: [facility];  Service: Urology;  Laterality: Bilateral;   CYSTOSCOPY WITH RETROGRADE PYELOGRAM, URETEROSCOPY AND STENT PLACEMENT Bilateral 09/30/2013   Procedure: CYSTOSCOPY WITH BILATERAL RETROGRADE PYELOGRAM, LEFT DIAGNOSTIC URETEROSCOPY AND Left ureteral stent;  Surgeon: Ricardo Likens, MD;  [facility];  Service: Urology;  Laterality: Bilateral;   ESOPHAGOGASTRODUODENOSCOPY (EGD) WITH PROPOFOL  N/A 04/25/2020   Mildly severe candida esophagitis without bleed, s/p biopsy. Suspicion for eosinophilic esophagitis but no increased eosinophils. Gastritis. Reactive gastropathy. Negative H.pylori.  +KOH prep.    PERCUTANEOUS NEPHROLITHOTRIPSY  2005   POLYPECTOMY  04/25/2020   Procedure: POLYPECTOMY;  Surgeon: Cindie Carlin POUR, DO;  [facility];  Service: Endoscopy;;  colon   ROBOT ASSISTED PYELOPLASTY N/A 12/09/2013   Procedure: ROBOTIC ASSISTED BILATERAL PYELOLITHOTOMY, RIGHT  PYELOPLASTY ;  Surgeon: Ricardo Likens, MD;  Location: [facility];  Service: Urology;  Laterality: N/A;     Medications Prior to Admission: Prior to Admission medications  Medication Sig Start Date End Date Taking? Authorizing Provider  acetaminophen  (TYLENOL ) 325  MG tablet Take 325-650 mg by mouth every 6 (six) hours as needed (for pain.).    [provider]  amLODipine  (NORVASC ) 10 MG tablet Take 1 tablet (10 mg total) by mouth daily. 10/14/23   Gladis Mary-Margaret, FNP  amoxicillin -clavulanate (AUGMENTIN ) 875-125 MG tablet Take 1  tablet by mouth 2 (two) times daily. 05/14/24   Gladis, Mary-Margaret, FNP  Blood Glucose Monitoring Suppl (ONETOUCH VERIO FLEX SYSTEM) w/Device KIT Use to test blood sugar twice daily. DX E11.9 03/14/20   Gladis, Mary-Margaret, FNP  cloNIDine  (CATAPRES ) 0.1 MG tablet Take 1 tablet (0.1 mg total) by mouth 3 (three) times daily. 04/09/24   Gladis Mustard, FNP  Continuous Blood Gluc Receiver (FREESTYLE LIBRE 2 READER) DEVI 1 each by Does not apply route daily. 08/13/22   Gladis Mustard, FNP  Continuous Glucose Sensor (FREESTYLE LIBRE 2 SENSOR) MISC CHECK BLOOD SUGAR AS DIRECTED 04/02/24   Gladis Mustard, FNP  FARXIGA  10 MG TABS tablet Take 1 tablet (10 mg total) by mouth daily before breakfast. 11/29/23   Gladis Mustard, FNP  FEROSUL 325 (65 Fe) MG tablet TAKE ONE TABLET DAILY WITH BREAKFAST 12/30/23   Gladis, Mary-Margaret, FNP  glimepiride  (AMARYL ) 4 MG tablet Take 1 tablet (4 mg total) by mouth daily with breakfast. 04/16/24   Therisa Benton PARAS, NP  glucose blood (ONETOUCH VERIO) test strip Use to test blood sugar twice daily. DX E11.9 03/14/20   Gladis Mary-Margaret, FNP  insulin  degludec (TRESIBA  FLEXTOUCH) 200 UNIT/ML FlexTouch Pen Inject 50 Units into the skin daily. 04/16/24   Therisa Benton PARAS, NP  Insulin  Pen Needle (PEN NEEDLES 31GX5/16) 31G X 8 MM MISC Use to inject insulin  daily as prescribed 04/16/24   Therisa Benton PARAS, NP  losartan (COZAAR) 25 MG tablet Take 25 mg by mouth daily.    [provider]  Multiple Vitamins-Minerals (CENTRUM SILVER MEN 50+ PO) Take by mouth daily.    [provider]  OneTouch Delica Lancets 30G MISC Use to test blood sugar twice daily. DX E11.9 03/14/20   Gladis, Mary-Margaret, FNP  OVER THE COUNTER MEDICATION Take by mouth in the morning and at bedtime. [facility] Off    [provider]  OVER THE COUNTER MEDICATION Take by mouth daily. Nature's Bounty - Magnesium  500 mg    [provider]  pantoprazole  (PROTONIX ) 40 MG tablet Take 1 tablet (40 mg total) by mouth 2 (two) times daily. 10/14/23   Gladis Mustard, FNP     Allergies:   Allergies[1]  Social History:   Social History   Socioeconomic History   Marital status: Single    Spouse name: Not on file   Number of children: Not on file   Years of education: Not on file   Highest education level: Not on file  Occupational History   Not on file  Tobacco Use   Smoking status: Never   Smokeless tobacco: Never  Vaping Use   Vaping status: Never Used  Substance and Sexual Activity   Alcohol use: No    Comment: no history of etoh use   Drug use: No   Sexual activity: Not on file  Other Topics Concern   Not on file  Social History Narrative   Not on file   Social Drivers of Health   Tobacco Use: Low Risk (05/14/2024)   Patient History    Smoking Tobacco Use: Never    Smokeless Tobacco Use: Never    Passive Exposure: Not on file  Financial Resource Strain:  Low Risk (01/31/2024)   Overall Financial Resource Strain (CARDIA)    Difficulty of Paying Living Expenses: Not hard at all  Food Insecurity: No Food Insecurity (04/27/2024)   [facility], [facility], [facility]   Epic    Within the past 12 months, you worried that your food would run out before you got the money to buy more.: Never true    Within the past 12 months, the food you bought just didn't last and you didn't have money to get more.: Never true  Transportation Needs: No Transportation Needs (04/27/2024)   Received from [facility] - Transportation    Lack of Transportation (Medical): No    Lack of Transportation (Non-Medical): No  Physical Activity: Sufficiently Active (01/31/2024)   Exercise Vital Sign    Days of Exercise per Week: 7 days    Minutes of Exercise per Session: 30 min  Stress: No Stress Concern Present (01/31/2024)   [employer] of Occupational [employer] - Occupational Stress Questionnaire    Feeling of Stress:  Not at all  Social Connections: Moderately Integrated (01/31/2024)   Social Connection and Isolation Panel    Frequency of Communication with Friends and Family: More than three times a week    Frequency of Social Gatherings with Friends and Family: More than three times a week    Attends Religious Services: More than 4 times per year    Active Member of Clubs or Organizations: No    Attends Banker Meetings: Never    Marital Status: Married  Catering Manager Violence: Not At Risk (01/31/2024)   Epic    Fear of Current or Ex-Partner: No    Emotionally Abused: No    Physically Abused: No    Sexually Abused: No  Depression (PHQ2-9): Low Risk (05/14/2024)   Depression (PHQ2-9)    PHQ-2 Score: 0  Alcohol Screen: Low Risk (01/31/2024)   Alcohol Screen    Last Alcohol Screening Score (AUDIT): 0  Housing: Low Risk (01/31/2024)   Epic    Unable to Pay for Housing in the Last Year: No    Number of Times Moved in the Last Year: 0    Homeless in the Last Year: No  Utilities: Low Risk (04/27/2024)   Received from [facility]   Utilities    Within the past 12 months, have you been unable to get utilities(heat, electricity) when it was really needed?: No  Health Literacy: Adequate Health Literacy (01/31/2024)   B1300 Health Literacy    Frequency of need for help with medical instructions: Never     Family History:   The patient's family history includes Cancer in his mother. There is no history of Colon cancer or Pancreatitis.    ROS:  Please see the history of present illness.  All other ROS reviewed and negative.     Physical Exam/Data: Vitals:   05/21/24 0832  SpO2: 96%   No intake or output data in the 24 hours ending 05/21/24 1010    05/14/2024    3:10 PM 04/16/2024    1:08 PM 04/07/2024    2:51 PM  Last 3 Weights  Weight (lbs) 186 lb 194 lb 12.8 oz 191 lb  Weight (kg) 84.369 kg 88.361 kg 86.637 kg     There is no height or weight on file to calculate BMI.   General:  Well nourished, well developed, in no acute distress HEENT: normal Neck: no JVD Vascular: No carotid bruits; Distal pulses 2+  bilaterally   Cardiac:  normal S1, S2; RRR; no murmur  Lungs:  clear to auscultation bilaterally, no wheezing, rhonchi or rales  Abd: soft, nontender, no hepatomegaly  Ext: no edema Musculoskeletal:  No deformities, BUE and BLE strength normal and equal Skin: warm and dry  Neuro:  CNs 2-12 intact, no focal abnormalities noted Psych:  Normal affect   EKG:  The ECG that was done this morning was personally reviewed and demonstrates normal sinus rhythm with anterolateral STEMI pattern, anterolateral Q waves  Relevant CV Studies: Pending  Laboratory Data: High Sensitivity Troponin:  No results for input(s): TROPONINIHS in the last 720 hours.  Recent Labs  Lab 05/21/24 0840  TRNPT 4,926*        Chemistry Recent Labs  Lab 05/21/24 0840 05/21/24 0841  NA 131* 131*  K 4.1 4.0  CL 96* 97*  CO2 24  --   GLUCOSE 187* 192*  BUN 37* 33*  CREATININE 2.17* 2.20*  CALCIUM  9.0  --   GFRNONAA 32*  --   ANIONGAP 12  --     Recent Labs  Lab 05/21/24 0840  PROT 6.0*  ALBUMIN 3.3*  AST 142*  ALT 52*  ALKPHOS 121  BILITOT 0.5   Lipids  Recent Labs  Lab 05/21/24 0840  CHOL 154  TRIG 114  HDL 35*  LDLCALC 97  CHOLHDL 4.5   Hematology Recent Labs  Lab 05/21/24 0840 05/21/24 0841  WBC 8.8  --   RBC 4.13*  --   HGB 11.4* 11.6*  HCT 34.1* 34.0*  MCV 82.6  --   MCH 27.6  --   MCHC 33.4  --   RDW 13.6  --   PLT 203  --    Thyroid  No results for input(s): TSH, FREET4 in the last 168 hours. BNPNo results for input(s): BNP, PROBNP in the last 168 hours.  DDimer No results for input(s): DDIMER in the last 168 hours.  Radiology/Studies:  No results found.   Assessment and Plan: STEMI involving the LAD Acute systolic heart failure, secondary to #1 Type 2 diabetes, insulin  requiring Essential hypertension Mixed  hyperlipidemia, statin intolerant Chronic kidney disease stage IIIb  The patient presents with acute anterolateral STEMI.  EKG is diagnostic with anterior Q waves and ST elevation.  He presents with intermittent chest pain now for 4 days, also with clinical symptoms of congestive heart failure with orthopnea, cough, shortness of breath and fatigue.  With ongoing chest discomfort, even with mix of pleuritic and constant substernal symptoms, appropriate to proceed with cardiac catheterization and PCI emergently.  Emergency implied consent is obtained.  I discussed the procedure with the patient he understands the indications, alternatives, and potential risks.  Further plans pending his cardiac catheterization results.  Will measure LVEDP, plan to defer ventriculography to minimize contrast, assess LV function with stat echo post procedure, and perform PCI if indicated.  Patient will likely require CV-ICU care, advanced heart failure consultation to help with management, and close lab follow-up as he is at very high risk of acute kidney injury with his underlying stage IIIb kidney disease, acute heart failure, and acute late presenting MI.  Risk Assessment/Risk Scores:   TIMI Risk Score for ST  Elevation MI:   The patient's TIMI risk score is  , which indicates a  % risk of all cause mortality at 30 days.   New [place]  Heart Association (NYHA) Functional Class NYHA Class IV   Code Status: Full Code  Severity of Illness:  The appropriate patient status for this patient is INPATIENT. Inpatient status is judged to be reasonable and necessary in order to provide the required intensity of service to ensure the patient's safety. The patient's presenting symptoms, physical exam findings, and initial radiographic and laboratory data in the context of their chronic comorbidities is felt to place them at high risk for further clinical deterioration. Furthermore, it is not anticipated that the patient will be  medically stable for discharge from the hospital within 2 midnights of admission.   * I certify that at the point of admission it is my clinical judgment that the patient will require inpatient hospital care spanning beyond 2 midnights from the point of admission due to high intensity of service, high risk for further deterioration and high frequency of surveillance required.*  For questions or updates, please contact [facility] Please consult [email or web address] for contact info under       Signed, Philip Fell, MD  05/21/2024 10:10 AM      [1]  Allergies Allergen Reactions   Atorvastatin      Myopathy/weakness   Invokana  [Canagliflozin ] Other (See Comments)    weakness   Semaglutide  Other (See Comments)    Heartburn

## 2024-05-21 NOTE — Telephone Encounter (Signed)
 Patient Product/process Development Scientist completed.    The patient is insured through [employer]. Patient has Medicare and is not eligible for a copay card, but may be able to apply for patient assistance or Medicare RX Payment Plan (Patient Must reach out to their plan, if eligible for payment plan), if available.    Ran test claim for ticagrelor  (Brilinta ) 90 mg and the current 30 day co-pay is $21.21.  Ran test claim for Lantus  Pen and the current 30 day co-pay is $35.00.  Ran test claim for Tresiba  FlexTouch and the current 30 day co-pay is $35.00.  Ran test claim for Novolog  Flexpen and the current 30 day co-pay is $35.00.  This test claim was processed through [facility]- copay amounts may vary at other pharmacies due to pharmacy/plan contracts, or as the patient moves through the different stages of their insurance plan.     Reyes Sharps, CPHT Pharmacy Technician Patient Advocate Specialist Lead [facility] Patient Advocate Team Direct Number: [phone]  Fax: [phone]

## 2024-05-21 NOTE — Progress Notes (Addendum)
 Peripherally Inserted Central Catheter Placement  The IV Nurse has discussed with the patient and/or persons authorized to consent for the patient, the purpose of this procedure and the potential benefits and risks involved with this procedure.  The benefits include less needle sticks, lab draws from the catheter, and the patient may be discharged home with the catheter. Risks include, but not limited to, infection, bleeding, blood clot (thrombus formation), and puncture of an artery; nerve damage and irregular heartbeat and possibility to perform a PICC exchange if needed/ordered by physician.  Alternatives to this procedure were also discussed.  Bard Power PICC patient education guide, fact sheet on infection prevention and patient information card has been provided to patient /or left at bedside. ECG printed and placed on patient chart.  PICC Placement Documentation  PICC Double Lumen 05/21/24 Right Brachial 47 cm 0 cm (Active)  Indication for Insertion or Continuance of Line Vasoactive infusions;Chronic illness with exacerbations (CF, Sickle Cell, etc.) 05/21/24 1800  Exposed Catheter (cm) 0 cm 05/21/24 1800  Site Assessment Clean, Dry, Intact 05/21/24 1800  Lumen #1 Status Flushed;Saline locked;Blood return noted 05/21/24 1800  Lumen #2 Status Flushed;Saline locked;Blood return noted 05/21/24 1800  Dressing Type Transparent;Securing device 05/21/24 1800  Dressing Status Antimicrobial disc/dressing in place 05/21/24 1800  Line Care Connections checked and tightened 05/21/24 1800  Line Adjustment (NICU/IV Team Only) No 05/21/24 1800  Dressing Intervention New dressing;Adhesive placed at insertion site (IV team only) 05/21/24 1800  Dressing Change Due 05/28/24 05/21/24 1800       Leita Shipper 05/21/2024, 6:49 PM

## 2024-05-21 NOTE — Progress Notes (Addendum)
 Advanced Heart Failure Rounding Note  Cardiologist: None   AHF Cardiologist: Dr. Rolan  Chief Complaint: Late presenting anterior MI, acute CHF Patient Profile   Philip Richardson is a 68 y.o. male with history of poorly controlled DM II, HTN, HLD and CKD IIIb. Admitted with late presenting anterior STEMI and acute systolic CHF.  Significant events:   12/18: LHC 100% p LAD treated with 3 overlapping DES, residual 90% p diagonal post PCI. LVEDP 24 mmHg.  Subjective:    Seen in ICU after cath lab.  Has ongoing left-sided chest pain, worse with coughing fits.    Objective:   Weight Range:   There is no height or weight on file to calculate BMI.   Vital Signs:   Pulse Rate:  [69-87] 69 (12/18 1030) Resp:  [23-25] 23 (12/18 1030) BP: (102-105)/(59-65) 102/65 (12/18 1030) SpO2:  [96 %] 96 % (12/18 1030)    Weight change: There were no vitals filed for this visit.  Intake/Output:  No intake or output data in the 24 hours ending 05/21/24 1042   Physical Exam   General:  Fat Cor: Regular rate & rhythm. No murmurs. JVD ~8 cm.  Lungs: crackles in bases Extremities: no edema   Telemetry   SR 80s  Labs   CBC Recent Labs    05/21/24 0840 05/21/24 0841  WBC 8.8  --   NEUTROABS 6.4  --   HGB 11.4* 11.6*  HCT 34.1* 34.0*  MCV 82.6  --   PLT 203  --    Basic Metabolic Panel Recent Labs    87/81/74 0840 05/21/24 0841  NA 131* 131*  K 4.1 4.0  CL 96* 97*  CO2 24  --   GLUCOSE 187* 192*  BUN 37* 33*  CREATININE 2.17* 2.20*  CALCIUM  9.0  --    Liver Function Tests Recent Labs    05/21/24 0840  AST 142*  ALT 52*  ALKPHOS 121  BILITOT 0.5  PROT 6.0*  ALBUMIN 3.3*   No results for input(s): LIPASE, AMYLASE in the last 72 hours. Cardiac Enzymes No results for input(s): CKTOTAL, CKMB, CKMBINDEX, TROPONINI in the last 72 hours.  BNP: BNP (last 3 results) No results for input(s): BNP in the last 8760 hours.  ProBNP (last 3  results) No results for input(s): PROBNP in the last 8760 hours.   D-Dimer No results for input(s): DDIMER in the last 72 hours. Hemoglobin A1C Recent Labs    05/21/24 0840  HGBA1C 13.1*   Fasting Lipid Panel Recent Labs    05/21/24 0840  CHOL 154  HDL 35*  LDLCALC 97  TRIG 885  CHOLHDL 4.5   Medications:   Scheduled Medications:  [START ON 05/22/2024] aspirin EC  81 mg Oral Daily   Chlorhexidine  Gluconate Cloth  6 each Topical Q0600   [START ON 05/22/2024] heparin   5,000 Units Subcutaneous Q8H   insulin  aspart  0-15 Units Subcutaneous TID WC   sodium chloride  flush  3 mL Intravenous Q12H   ticagrelor   90 mg Oral BID    Infusions:  sodium chloride       PRN Medications: sodium chloride , acetaminophen , guaiFENesin -dextromethorphan , hydrALAZINE , labetalol , morphine  injection, nitroGLYCERIN , ondansetron  (ZOFRAN ) IV, mouth rinse, oxyCODONE , sodium chloride  flush  Assessment/Plan   Late presenting anterior STEMI/CAD - Emergent LHC showed 100% p LAD treated with 3 overlapping DES, residual 80% p diagonal post PCI - HS troponin 4,926 > repeat pending - DAPT was aspirin + ticagrelor . Start rosuvastatin  at 10 mg daily (  has been intolerant to statins in the past, can titrate upwards later on) - Giving morphine  now for residual chest pain. May have post infarct pericarditis. - phase I cardiac rehab  2. Acute systolic heart failure - ICM - LVEDP 24 in cath lab. Lactic acid okay.  - Echo pending - Place PICC for CVP and co-ox monitoring.  - Start IV lasix  80 BID - GDMT will be limited by CKD - On losartan at home >> depending on how his labs trend may be able to try entresto - Eventually add back home farxiga , A1c > 13  3. CKD IIIb - Baseline scr around 2, Scr 2.1 today - Follow - Sees Nephrology  4. Hyperlipidemia - LDL 97 - Not on statin. See above regarding trial on crestor   5. Uncontrolled DM II - A1c 13.1 - SSI - Consult diabetes coordinator -  Recently established with Endocrine in outpatient setting  6. Cough - Present X 1 month - Has received several courses of abx with no improvement - Suspect at least a component d/t CHF. Start diuresis.  - Chest x-ray - Ordered cough suppressant  7. Elevated LFTs - Mildly elevated - In setting of MI and CHF, will follow   Length of Stay: 0  FINCH, LINDSAY N, PA-C  05/21/2024, 10:42 AM  Advanced Heart Failure Team Pager [phone] (M-F; 7a - 5p)   Please visit [email or web address]: For overnight coverage please call cardiology fellow first. If fellow not available call Shock/ECMO MD on call.  For ECMO / Mechanical Support (Impella, IABP, LVAD) issues call Shock / ECMO MD on call.    Patient seen with PA, I formulated the plan and agree with the above note.   Patient came to the ER with late presentation anterior MI.  Chest pain today is primarily pleuritic.  He went to the cath lab, cath showed occluded LAD that was treated with overlapping DES x 3.  There is residual 80% D1.  LVEDP 24, lactate 0.6. Creatinine 2.17 which appears to be near his baseline.  I reviewed the echo done at bedside.  This was a technically difficult study.  Normal LV size with mild LVH, EF 35-40% with peri-apical akinesis, possible apical thrombus noted on non-enhanced images, though I cannot find it on the contrast-enhanced images (which were, however, poor); RV poorly visualized.   ECG post-procedure still showed anterior STE.   Patient currently is sitting up, reports chest pain worsened by breathing and coughing.   General: mild distress.  Neck: JVP difficult, no thyromegaly or thyroid  nodule.  Lungs: Clear to auscultation bilaterally with normal respiratory effort. CV: Nondisplaced PMI.  Heart regular S1/S2, no S3/S4, no murmur or rub heard.  No peripheral edema.  No carotid bruit.  Normal pedal pulses.  Abdomen: Soft, nontender, no hepatosplenomegaly, no distention.  Skin: Intact without lesions or rashes.   Neurologic: Alert and oriented x 3.  Psych: Normal affect. Extremities: No clubbing or cyanosis.  HEENT: Normal.   1. CAD: Late-presenting anterior STEMI, symptoms began on 12/14.  Cath was done showing occluded LAD that was treated with overlapping DES x 3.  There was residual 80% D1. Symptoms had a pleuritic character pre-cath, and he continues to have significant pleuritic chest pain (not improved by PCI).  ECG post-cath showed persistent anterior STE.  I suspect that this is post-infarct pericarditis.  No significant pericardial effusion present.  - Continue ASA 81 and ticagrelor .  - Start Crestor  10 mg daily, has not been able to tolerate statins  in the past so will start low dose.  - Will start colchicine  0.6 mg daily (renal dosing) given concern for post-infarct pericarditis, will also need morphine  prn.  2. Acute systolic CHF: Ischemic cardiomyopathy.  I reviewed echo at bedside today, Normal LV size with mild LVH, EF 35-40% with peri-apical akinesis, possible apical thrombus noted on non-enhanced images, though I cannot find it on the contrast-enhanced images (which were, however, poor); RV poorly visualized.  LVEDP 24 at cath. He is short of breath, difficult to tell how much of this is CHF vs post-infarct pericarditis and splinting.  Exam difficult for volume. Creatinine 2.17, this seems to be his baseline.  - He had a dose of Lasix  80 mg IV x 1, will assess CVP after PICC in place to determine further dosing.   - Place PICC to follow CVP and co-ox, given CKD want to avoid overshooting diuresis.  He does not look markedly volume overloaded.  - Other GDMT will depend on creatinine trend and BP.   Eventually restart his home Farxiga , was on losartan at home and may be able to transition to Entresto.  3. CKD stage 3: Suspect diabetic nephropathy. Creatinine 2.17 today which appears to be his baseline.  4. Type 2 DM: Poor control with HgbA1c 13.1.   - SSI  - Diabetes coordinator consult.  -  Eventually restart Farxiga .  5. Indeterminant echo for LV thrombus: As above, possible LV thrombus on non-enhanced images, but cannot see on contrast images.  Will get cardiac MRI.  Probably tomorrow given coughing and pain today.   CRITICAL CARE Performed by: Ezra Shuck  Total critical care time: 50 minutes  Critical care time was exclusive of separately billable procedures and treating other patients.  Critical care was necessary to treat or prevent imminent or life-threatening deterioration.  Critical care was time spent personally by me on the following activities: development of treatment plan with patient and/or surrogate as well as nursing, discussions with consultants, evaluation of patient's response to treatment, examination of patient, obtaining history from patient or surrogate, ordering and performing treatments and interventions, ordering and review of laboratory studies, ordering and review of radiographic studies, pulse oximetry and re-evaluation of patient's condition.  Ezra Shuck 05/21/2024 12:16 PM

## 2024-05-21 NOTE — Progress Notes (Signed)
°  Echocardiogram 2D Echocardiogram has been performed.  Tinnie FORBES Gosling RDCS 05/21/2024, 10:52 AM

## 2024-05-22 ENCOUNTER — Encounter (HOSPITAL_COMMUNITY): Admission: EM | Disposition: E | Payer: Self-pay | Source: Home / Self Care | Attending: Internal Medicine

## 2024-05-22 ENCOUNTER — Other Ambulatory Visit (HOSPITAL_COMMUNITY): Payer: Self-pay

## 2024-05-22 ENCOUNTER — Inpatient Hospital Stay (HOSPITAL_COMMUNITY)

## 2024-05-22 DIAGNOSIS — I5082 Biventricular heart failure: Secondary | ICD-10-CM | POA: Diagnosis not present

## 2024-05-22 DIAGNOSIS — I3139 Other pericardial effusion (noninflammatory): Secondary | ICD-10-CM

## 2024-05-22 DIAGNOSIS — I429 Cardiomyopathy, unspecified: Secondary | ICD-10-CM | POA: Diagnosis not present

## 2024-05-22 DIAGNOSIS — R570 Cardiogenic shock: Secondary | ICD-10-CM

## 2024-05-22 DIAGNOSIS — I2102 ST elevation (STEMI) myocardial infarction involving left anterior descending coronary artery: Secondary | ICD-10-CM | POA: Diagnosis not present

## 2024-05-22 HISTORY — PX: IABP INSERTION: CATH118242

## 2024-05-22 HISTORY — PX: ARTERIAL LINE INSERTION: CATH118227

## 2024-05-22 HISTORY — PX: RIGHT HEART CATH: CATH118263

## 2024-05-22 LAB — POCT I-STAT 7, (LYTES, BLD GAS, ICA,H+H)
Acid-base deficit: 4 mmol/L — ABNORMAL HIGH (ref 0.0–2.0)
Acid-base deficit: 8 mmol/L — ABNORMAL HIGH (ref 0.0–2.0)
Bicarbonate: 14.8 mmol/L — ABNORMAL LOW (ref 20.0–28.0)
Bicarbonate: 18.5 mmol/L — ABNORMAL LOW (ref 20.0–28.0)
Calcium, Ion: 0.92 mmol/L — ABNORMAL LOW (ref 1.15–1.40)
Calcium, Ion: 0.93 mmol/L — ABNORMAL LOW (ref 1.15–1.40)
HCT: 30 % — ABNORMAL LOW (ref 39.0–52.0)
HCT: 31 % — ABNORMAL LOW (ref 39.0–52.0)
Hemoglobin: 10.2 g/dL — ABNORMAL LOW (ref 13.0–17.0)
Hemoglobin: 10.5 g/dL — ABNORMAL LOW (ref 13.0–17.0)
O2 Saturation: 98 %
O2 Saturation: 99 %
Potassium: 4.4 mmol/L (ref 3.5–5.1)
Potassium: 4.5 mmol/L (ref 3.5–5.1)
Sodium: 128 mmol/L — ABNORMAL LOW (ref 135–145)
Sodium: 128 mmol/L — ABNORMAL LOW (ref 135–145)
TCO2: 16 mmol/L — ABNORMAL LOW (ref 22–32)
TCO2: 19 mmol/L — ABNORMAL LOW (ref 22–32)
pCO2 arterial: 22.9 mmHg — ABNORMAL LOW (ref 32–48)
pCO2 arterial: 25.2 mmHg — ABNORMAL LOW (ref 32–48)
pH, Arterial: 7.418 (ref 7.35–7.45)
pH, Arterial: 7.474 — ABNORMAL HIGH (ref 7.35–7.45)
pO2, Arterial: 132 mmHg — ABNORMAL HIGH (ref 83–108)
pO2, Arterial: 99 mmHg (ref 83–108)

## 2024-05-22 LAB — POCT I-STAT EG7
Acid-Base Excess: 4 mmol/L — ABNORMAL HIGH (ref 0.0–2.0)
Acid-Base Excess: 4 mmol/L — ABNORMAL HIGH (ref 0.0–2.0)
Bicarbonate: 26.7 mmol/L (ref 20.0–28.0)
Bicarbonate: 26.7 mmol/L (ref 20.0–28.0)
Calcium, Ion: 0.96 mmol/L — ABNORMAL LOW (ref 1.15–1.40)
Calcium, Ion: 0.97 mmol/L — ABNORMAL LOW (ref 1.15–1.40)
HCT: 29 % — ABNORMAL LOW (ref 39.0–52.0)
HCT: 29 % — ABNORMAL LOW (ref 39.0–52.0)
Hemoglobin: 9.9 g/dL — ABNORMAL LOW (ref 13.0–17.0)
Hemoglobin: 9.9 g/dL — ABNORMAL LOW (ref 13.0–17.0)
O2 Saturation: 60 %
O2 Saturation: 62 %
Potassium: 4.7 mmol/L (ref 3.5–5.1)
Potassium: 4.7 mmol/L (ref 3.5–5.1)
Sodium: 128 mmol/L — ABNORMAL LOW (ref 135–145)
Sodium: 128 mmol/L — ABNORMAL LOW (ref 135–145)
TCO2: 28 mmol/L (ref 22–32)
TCO2: 28 mmol/L (ref 22–32)
pCO2, Ven: 32.2 mmHg — ABNORMAL LOW (ref 44–60)
pCO2, Ven: 32.3 mmHg — ABNORMAL LOW (ref 44–60)
pH, Ven: 7.525 — ABNORMAL HIGH (ref 7.25–7.43)
pH, Ven: 7.526 — ABNORMAL HIGH (ref 7.25–7.43)
pO2, Ven: 27 mmHg — CL (ref 32–45)
pO2, Ven: 28 mmHg — CL (ref 32–45)

## 2024-05-22 LAB — CBC
HCT: 36.2 % — ABNORMAL LOW (ref 39.0–52.0)
Hemoglobin: 12.2 g/dL — ABNORMAL LOW (ref 13.0–17.0)
MCH: 28.4 pg (ref 26.0–34.0)
MCHC: 33.7 g/dL (ref 30.0–36.0)
MCV: 84.4 fL (ref 80.0–100.0)
Platelets: 224 K/uL (ref 150–400)
RBC: 4.29 MIL/uL (ref 4.22–5.81)
RDW: 14.3 % (ref 11.5–15.5)
WBC: 17.5 K/uL — ABNORMAL HIGH (ref 4.0–10.5)
nRBC: 0 % (ref 0.0–0.2)

## 2024-05-22 LAB — BASIC METABOLIC PANEL WITH GFR
Anion gap: 12 (ref 5–15)
Anion gap: 17 — ABNORMAL HIGH (ref 5–15)
BUN: 40 mg/dL — ABNORMAL HIGH (ref 8–23)
BUN: 43 mg/dL — ABNORMAL HIGH (ref 8–23)
CO2: 24 mmol/L (ref 22–32)
CO2: 20 mmol/L — ABNORMAL LOW (ref 22–32)
Calcium: 9.2 mg/dL (ref 8.9–10.3)
Calcium: 8.8 mg/dL — ABNORMAL LOW (ref 8.9–10.3)
Chloride: 93 mmol/L — ABNORMAL LOW (ref 98–111)
Chloride: 89 mmol/L — ABNORMAL LOW (ref 98–111)
Creatinine, Ser: 2.68 mg/dL — ABNORMAL HIGH (ref 0.61–1.24)
Creatinine, Ser: 2.8 mg/dL — ABNORMAL HIGH (ref 0.61–1.24)
GFR, Estimated: 25 mL/min — ABNORMAL LOW
GFR, Estimated: 24 mL/min — ABNORMAL LOW
Glucose, Bld: 120 mg/dL — ABNORMAL HIGH (ref 70–99)
Glucose, Bld: 225 mg/dL — ABNORMAL HIGH (ref 70–99)
Potassium: 4.8 mmol/L (ref 3.5–5.1)
Potassium: 4.6 mmol/L (ref 3.5–5.1)
Sodium: 129 mmol/L — ABNORMAL LOW (ref 135–145)
Sodium: 126 mmol/L — ABNORMAL LOW (ref 135–145)

## 2024-05-22 LAB — GLUCOSE, CAPILLARY
Glucose-Capillary: 97 mg/dL (ref 70–99)
Glucose-Capillary: 91 mg/dL (ref 70–99)
Glucose-Capillary: 141 mg/dL — ABNORMAL HIGH (ref 70–99)
Glucose-Capillary: 181 mg/dL — ABNORMAL HIGH (ref 70–99)
Glucose-Capillary: 248 mg/dL — ABNORMAL HIGH (ref 70–99)

## 2024-05-22 LAB — COOXEMETRY PANEL
Carboxyhemoglobin: 1.7 % — ABNORMAL HIGH (ref 0.5–1.5)
Carboxyhemoglobin: 1.2 % (ref 0.5–1.5)
Carboxyhemoglobin: 1.2 % (ref 0.5–1.5)
Carboxyhemoglobin: 0.6 % (ref 0.5–1.5)
Carboxyhemoglobin: 1.2 % (ref 0.5–1.5)
Methemoglobin: <0.7 % (ref 0.0–1.5)
Methemoglobin: <0.7 % (ref 0.0–1.5)
Methemoglobin: <0.7 % (ref 0.0–1.5)
Methemoglobin: <0.7 % (ref 0.0–1.5)
Methemoglobin: <0.7 % (ref 0.0–1.5)
O2 Saturation: 53.5 %
O2 Saturation: 40.6 %
O2 Saturation: 47.2 %
O2 Saturation: 37.2 %
O2 Saturation: 41.9 %
Total hemoglobin: 10.1 g/dL — ABNORMAL LOW (ref 12.0–16.0)
Total hemoglobin: 12.9 g/dL (ref 12.0–16.0)
Total hemoglobin: 11.9 g/dL — ABNORMAL LOW (ref 12.0–16.0)
Total hemoglobin: 11.5 g/dL — ABNORMAL LOW (ref 12.0–16.0)
Total hemoglobin: 11.5 g/dL — ABNORMAL LOW (ref 12.0–16.0)

## 2024-05-22 LAB — CG4 I-STAT (LACTIC ACID)
Lactic Acid, Venous: 2.4 mmol/L (ref 0.5–1.9)
Lactic Acid, Venous: 3 mmol/L (ref 0.5–1.9)
Lactic Acid, Venous: 3.3 mmol/L (ref 0.5–1.9)

## 2024-05-22 LAB — SEDIMENTATION RATE: Sed Rate: 76 mm/h — ABNORMAL HIGH (ref 0–16)

## 2024-05-22 LAB — HEPARIN LEVEL (UNFRACTIONATED): Heparin Unfractionated: 0.47 [IU]/mL (ref 0.30–0.70)

## 2024-05-22 LAB — MAGNESIUM: Magnesium: 1.8 mg/dL (ref 1.7–2.4)

## 2024-05-22 LAB — C-REACTIVE PROTEIN: CRP: 29.3 mg/dL — ABNORMAL HIGH

## 2024-05-22 LAB — LACTIC ACID, PLASMA: Lactic Acid, Venous: 2.1 mmol/L (ref 0.5–1.9)

## 2024-05-22 MED ORDER — AMIODARONE LOAD VIA INFUSION
INTRAVENOUS | Status: DC | PRN
  Administered 2024-05-22 (×2): 150 mg via INTRAVENOUS

## 2024-05-22 MED ORDER — SODIUM BICARBONATE 8.4 % IV SOLN
INTRAVENOUS | Status: AC
  Filled 2024-05-22: qty 50

## 2024-05-22 MED ORDER — HEPARIN (PORCINE) 25000 UT/250ML-% IV SOLN
1250.0000 [IU]/h | INTRAVENOUS | Status: DC
  Administered 2024-05-22 – 2024-05-23 (×2): 1250 [IU]/h via INTRAVENOUS
  Filled 2024-05-22 (×2): qty 250

## 2024-05-22 MED ORDER — MAGNESIUM SULFATE 2 GM/50ML IV SOLN
2.0000 g | Freq: Once | INTRAVENOUS | Status: AC
  Administered 2024-05-22: 2 g via INTRAVENOUS
  Filled 2024-05-22: qty 50

## 2024-05-22 MED ORDER — HEPARIN SODIUM (PORCINE) 1000 UNIT/ML IJ SOLN
INTRAMUSCULAR | Status: AC
  Filled 2024-05-22: qty 10

## 2024-05-22 MED ORDER — MIDAZOLAM HCL 2 MG/2ML IJ SOLN
INTRAMUSCULAR | Status: AC
  Filled 2024-05-22: qty 2

## 2024-05-22 MED ORDER — FUROSEMIDE 10 MG/ML IJ SOLN
80.0000 mg | Freq: Two times a day (BID) | INTRAMUSCULAR | Status: DC
  Administered 2024-05-22: 80 mg via INTRAVENOUS
  Filled 2024-05-22: qty 8

## 2024-05-22 MED ORDER — VASOPRESSIN 20 UNITS/100 ML INFUSION FOR SHOCK
INTRAVENOUS | Status: AC
  Administered 2024-05-23: 0.04 [IU]/min via INTRAVENOUS
  Filled 2024-05-22: qty 100

## 2024-05-22 MED ORDER — LIDOCAINE HCL (PF) 1 % IJ SOLN
INTRAMUSCULAR | Status: DC | PRN
  Administered 2024-05-22: 5 mL via INTRADERMAL
  Administered 2024-05-22: 2 mL via INTRADERMAL

## 2024-05-22 MED ORDER — VASOPRESSIN 20 UNITS/100 ML INFUSION FOR SHOCK
0.0000 [IU]/min | INTRAVENOUS | Status: DC
  Administered 2024-05-22: 0.02 [IU]/min via INTRAVENOUS
  Administered 2024-05-23 – 2024-05-24 (×4): 0.04 [IU]/min via INTRAVENOUS
  Administered 2024-05-30: 0.03 [IU]/min via INTRAVENOUS
  Administered 2024-05-30: 0.04 [IU]/min via INTRAVENOUS
  Filled 2024-05-22 (×6): qty 100
  Filled 2024-05-22: qty 200

## 2024-05-22 MED ORDER — SODIUM BICARBONATE 8.4 % IV SOLN
INTRAVENOUS | Status: AC
  Filled 2024-05-22: qty 100

## 2024-05-22 MED ORDER — MILRINONE LACTATE IN DEXTROSE 20-5 MG/100ML-% IV SOLN
0.2500 ug/kg/min | INTRAVENOUS | Status: DC
  Administered 2024-05-22: 0.375 ug/kg/min via INTRAVENOUS
  Administered 2024-05-22: 0.25 ug/kg/min via INTRAVENOUS
  Filled 2024-05-22 (×2): qty 100

## 2024-05-22 MED ORDER — FAMOTIDINE 20 MG PO TABS: 20.0000 mg | ORAL_TABLET | ORAL | Status: DC

## 2024-05-22 MED ORDER — CLOPIDOGREL BISULFATE 300 MG PO TABS
ORAL_TABLET | ORAL | Status: DC | PRN
  Administered 2024-05-22: 600 mg via ORAL

## 2024-05-22 MED ORDER — SODIUM BICARBONATE 8.4 % IV SOLN
INTRAVENOUS | Status: DC | PRN
  Administered 2024-05-22: 50 meq via INTRAVENOUS
  Administered 2024-05-22: 100 meq via INTRAVENOUS

## 2024-05-22 MED ORDER — HEPARIN SODIUM (PORCINE) 1000 UNIT/ML IJ SOLN
INTRAMUSCULAR | Status: DC | PRN
  Administered 2024-05-22: 5000 [IU] via INTRAVENOUS

## 2024-05-22 MED ORDER — AMIODARONE HCL IN DEXTROSE 360-4.14 MG/200ML-% IV SOLN
INTRAVENOUS | Status: AC
  Filled 2024-05-22: qty 200

## 2024-05-22 MED ORDER — CLOPIDOGREL BISULFATE 300 MG PO TABS
600.0000 mg | ORAL_TABLET | Freq: Once | ORAL | Status: AC
  Administered 2024-05-23: 600 mg via ORAL
  Filled 2024-05-22: qty 2

## 2024-05-22 MED ORDER — CLOPIDOGREL BISULFATE 75 MG PO TABS: 75.0000 mg | ORAL_TABLET | Freq: Every day | ORAL | Status: DC

## 2024-05-22 MED ORDER — GADOBUTROL 1 MMOL/ML IV SOLN
13.0000 mL | Freq: Once | INTRAVENOUS | Status: AC | PRN
  Administered 2024-05-22: 13 mL via INTRAVENOUS

## 2024-05-22 MED ORDER — MIDAZOLAM HCL (PF) 2 MG/2ML IJ SOLN
INTRAMUSCULAR | Status: DC | PRN
  Administered 2024-05-22: 1 mg via INTRAVENOUS

## 2024-05-22 MED ORDER — EPINEPHRINE HCL 5 MG/250ML IV SOLN IN NS: 0.5000 ug/min | INTRAVENOUS | Status: DC

## 2024-05-22 MED ORDER — HEPARIN BOLUS VIA INFUSION
2000.0000 [IU] | Freq: Once | INTRAVENOUS | Status: AC
  Administered 2024-05-22: 2000 [IU] via INTRAVENOUS
  Filled 2024-05-22: qty 2000

## 2024-05-22 MED ORDER — NOREPINEPHRINE 4 MG/250ML-% IV SOLN
0.0000 ug/min | INTRAVENOUS | Status: DC
  Administered 2024-05-22: 5 ug/min via INTRAVENOUS
  Administered 2024-05-23: 17 ug/min via INTRAVENOUS
  Filled 2024-05-22 (×2): qty 250
  Filled 2024-05-22: qty 500
  Filled 2024-05-22: qty 250

## 2024-05-22 MED ORDER — LIDOCAINE HCL (PF) 1 % IJ SOLN
INTRAMUSCULAR | Status: AC
  Filled 2024-05-22: qty 30

## 2024-05-22 MED ORDER — CLOPIDOGREL BISULFATE 300 MG PO TABS
ORAL_TABLET | ORAL | Status: AC
  Filled 2024-05-22: qty 2

## 2024-05-22 MED ORDER — PANTOPRAZOLE SODIUM 40 MG PO TBEC
40.0000 mg | DELAYED_RELEASE_TABLET | Freq: Two times a day (BID) | ORAL | Status: DC
  Administered 2024-05-22: 40 mg via ORAL
  Filled 2024-05-22 (×2): qty 1

## 2024-05-22 MED ORDER — AMIODARONE HCL IN DEXTROSE 360-4.14 MG/200ML-% IV SOLN
60.0000 mg/h | INTRAVENOUS | Status: DC
  Administered 2024-05-22: 30 mg/h via INTRAVENOUS
  Administered 2024-05-23 – 2024-05-30 (×30): 60 mg/h via INTRAVENOUS
  Filled 2024-05-22 (×10): qty 200
  Filled 2024-05-22: qty 600
  Filled 2024-05-22: qty 200
  Filled 2024-05-22: qty 400
  Filled 2024-05-22 (×2): qty 200
  Filled 2024-05-22: qty 400
  Filled 2024-05-22 (×9): qty 200
  Filled 2024-05-22: qty 400
  Filled 2024-05-22: qty 200

## 2024-05-22 MED ORDER — FUROSEMIDE 10 MG/ML IJ SOLN: 80.0000 mg | Freq: Once | INTRAMUSCULAR | Status: DC

## 2024-05-22 NOTE — Progress Notes (Signed)
 ANTICOAGULATION CONSULT NOTE  Pharmacy Consult for heparin  Indication: LV thrombus  Allergies[1]  Patient Measurements: Weight: 84.2 kg (185 lb 9.6 oz) Heparin  Dosing Weight: 84 kg    Vital Signs: Temp: 98.7 F (37.1 C) (12/19 1510) Temp Source: Oral (12/19 1510) BP: 112/63 (12/19 1615) Pulse Rate: 123 (12/19 1615)  Labs: Recent Labs    05/21/24 0840 05/21/24 0841 05/21/24 1855 05/22/24 0415 05/22/24 1631 05/22/24 1835  HGB 11.4* 11.6*  --  12.2*  --   --   HCT 34.1* 34.0*  --  36.2*  --   --   PLT 203  --   --  224  --   --   APTT >200*  --   --   --   --   --   LABPROT 18.2*  --   --   --   --   --   INR 1.4*  --   --   --   --   --   HEPARINUNFRC  --   --   --   --   --  0.47  CREATININE 2.17* 2.20* 2.32* 2.68* 2.80*  --     Estimated Creatinine Clearance: 28.5 mL/min (A) (by C-G formula based on SCr of 2.8 mg/dL (H)).  Medical History: Past Medical History:  Diagnosis Date   Frequency of urination    GERD (gastroesophageal reflux disease)    Horseshoe kidney    BILATERAL   Hypertension    Renal calculus, bilateral    Type 2 diabetes mellitus (HCC)    Urgency of urination    Wears dentures     Medications:  See MAR  PTA Anticoagulation: none  Assessment: 52 yoM presents as late presenting STEMI s/p overlapping DES x3 to LAD with ischemic CMP (EF 40% TTE, 30% cMRI) with LV thrombus on cMRI.  Not on anticoagulation prior to admission.  Pharmacy consulted for heparin  dosing.  CBC stable - Hgb 12.2, pltc 224.  Started on Brilinta  post cath, plan to transition to Plavix  this evening with new triple therapy.   Heparin  level is therapeutic at 0.47, on heparin  infusion at 1250 units/hr. No s/sx of bleeding or infusion issues. Confirmed drawn appropriately.   Goal of Therapy:  Heparin  level 0.3-0.7 units/ml Monitor platelets by anticoagulation protocol: Yes   Plan:  Continue heparin  infusion at 1250 units/hr 8 hour anti-Xa level with AM labs  Daily  CBC, anti-Xa level Monitor for s/sx of bleeding F/u transition to DOAC, copay checks sent  Thank you for allowing pharmacy to participate in this patient's care,  Suzen Sour, PharmD, BCCCP Clinical Pharmacist  Phone: [phone] 05/22/2024 7:21 PM  Please check AMION for all [facility] Pharmacy phone numbers After 10:00 PM, call Main Pharmacy [phone]      [1]  Allergies Allergen Reactions   Atorvastatin  Other (See Comments)    Myopathy/weakness   Invokana  [Canagliflozin ] Other (See Comments)    weakness   Semaglutide  Other (See Comments)    Heartburn

## 2024-05-22 NOTE — Inpatient Diabetes Management (Addendum)
 Inpatient Diabetes Program Recommendations  AACE/ADA: New Consensus Statement on Inpatient Glycemic Control (2015)  Target Ranges:  Prepandial:   less than 140 mg/dL      Peak postprandial:   less than 180 mg/dL (1-2 hours)      Critically ill patients:  140 - 180 mg/dL   Lab Results  Component Value Date   GLUCAP 141 (H) 05/22/2024   HGBA1C 13.1 (H) 05/21/2024    Review of Glycemic Control  Latest Reference Range & Units 05/21/24 16:05 05/21/24 21:03 05/22/24 06:30 05/22/24 10:21 05/22/24 11:19  Glucose-Capillary 70 - 99 mg/dL 879 (H) 894 (H) 97 91 858 (H)  (H): Data is abnormally high Diabetes history: Type 2 DM Outpatient Diabetes medications: Farxiga  10 mg every day, Tresiba  05 units at bedtime, Amaryl  4 mg QD Current orders for Inpatient glycemic control: Lantus  50 units at bedtime, Novolog  0-15 units TID   Inpatient Diabetes Program Recommendations:    Spoke with patient again to further reinforce discussion from yesterday. Suprisingly, trends look within goal. Feel that food intake is our biggest variable. Patient plans to continue to watch post prandial measures and reviewed when to follow up with PCP/endocrinology.   Thanks, Tinnie Minus, MSN, RNC-OB Diabetes Coordinator [phone] (8a-5p)

## 2024-05-22 NOTE — Discharge Instructions (Signed)
 Information about your medication: Plavix  (anti-platelet agent)  Generic Name (Brand): clopidogrel  (Plavix ), once daily medication  PURPOSE: You are taking this medication along with aspirin  to lower your chance of having a heart attack, stroke, or blood clots in your heart stent. These can be fatal. Plavix  and aspirin  help prevent platelets from sticking together and forming a clot that can block an artery or your stent.   Common SIDE EFFECTS you may experience include: bruising or bleeding more easily, shortness of breath  Do not stop taking PLAVIX  without talking to the doctor who prescribes it for you. People who are treated with a stent and stop taking Plavix  too soon, have a higher risk of getting a blood clot in the stent, having a heart attack, or dying. If you stop Plavix  because of bleeding, or for other reasons, your risk of a heart attack or stroke may increase.   Avoid taking NSAID agents or anti-inflammatory medications such as ibuprofen, naproxen given increased bleed risk with plavix  - can use acetaminophen  (Tylenol ) if needed for pain.  Avoid taking over the counter stomach medications omeprazole  (Prilosec) or esomeprazole (Nexium) since these do interact and make plavix  less effective - ask your pharmacist or doctor for alterative agents if needed for heartburn or GERD.   Tell all of your doctors and dentists that you are taking Plavix . They should talk to the doctor who prescribed Plavix  for you before you have any surgery or invasive procedure.   Contact your health care provider if you experience: severe or uncontrollable bleeding, pink/red/brown urine, vomiting blood or vomit that looks like coffee grounds, red or black stools (looks like tar), coughing up blood or blood clots ---------------------------------------------------------------------------------------------------------------------- Information on my medicine - ELIQUIS (apixaban)   Why was Eliquis prescribed  for you? Eliquis was prescribed for you to reduce the risk of forming blood clots that can cause a stroke if you have a medical condition called atrial fibrillation (a type of irregular heartbeat) OR to reduce the risk of a blood clots forming after orthopedic surgery.  What do You need to know about Eliquis ? Take your Eliquis TWICE DAILY - one tablet in the morning and one tablet in the evening with or without food.  It would be best to take the doses about the same time each day.  If you have difficulty swallowing the tablet whole please discuss with your pharmacist how to take the medication safely.  Take Eliquis exactly as prescribed by your doctor and DO NOT stop taking Eliquis without talking to the doctor who prescribed the medication.  Stopping may increase your risk of developing a new clot or stroke.  Refill your prescription before you run out.  After discharge, you should have regular check-up appointments with your healthcare provider that is prescribing your Eliquis.  In the future your dose may need to be changed if your kidney function or weight changes by a significant amount or as you get older.  What do you do if you miss a dose? If you miss a dose, take it as soon as you remember on the same day and resume taking twice daily.  Do not take more than one dose of ELIQUIS at the same time.  Important Safety Information A possible side effect of Eliquis is bleeding. You should call your healthcare provider right away if you experience any of the following: Bleeding from an injury or your nose that does not stop. Unusual colored urine (red or dark brown) or unusual colored  stools (red or black). Unusual bruising for unknown reasons. A serious fall or if you hit your head (even if there is no bleeding).  Some medicines may interact with Eliquis and might increase your risk of bleeding or clotting while on Eliquis. To help avoid this, consult your healthcare provider or  pharmacist prior to using any new prescription or non-prescription medications, including herbals, vitamins, non-steroidal anti-inflammatory drugs (NSAIDs) and supplements.  This website has more information on Eliquis (apixaban): http://www.eliquis.com/eliquis/home

## 2024-05-22 NOTE — Progress Notes (Addendum)
 ANTICOAGULATION CONSULT NOTE  Pharmacy Consult for heparin  Indication: LV thrombus  Allergies[1]  Patient Measurements: Weight: 84.2 kg (185 lb 9.6 oz) Heparin  Dosing Weight: 84 kg    Vital Signs: Temp: 98.4 F (36.9 C) (12/19 0700) Temp Source: Oral (12/19 0700) BP: 107/79 (12/19 0934) Pulse Rate: 84 (12/19 0800)  Labs: Recent Labs    05/21/24 0840 05/21/24 0841 05/21/24 1855 05/22/24 0415  HGB 11.4* 11.6*  --  12.2*  HCT 34.1* 34.0*  --  36.2*  PLT 203  --   --  224  APTT >200*  --   --   --   LABPROT 18.2*  --   --   --   INR 1.4*  --   --   --   CREATININE 2.17* 2.20* 2.32* 2.68*    Estimated Creatinine Clearance: 29.8 mL/min (A) (by C-G formula based on SCr of 2.68 mg/dL (H)).  Medical History: Past Medical History:  Diagnosis Date   Frequency of urination    GERD (gastroesophageal reflux disease)    Horseshoe kidney    BILATERAL   Hypertension    Renal calculus, bilateral    Type 2 diabetes mellitus (HCC)    Urgency of urination    Wears dentures     Medications:  See MAR  PTA Anticoagulation: none  Assessment: 31 yoM presents as late presenting STEMI s/p overlapping DES x3 to LAD with ischemic CMP (EF 40% TTE, 30% cMRI) with LV thrombus on cMRI.  Not on anticoagulation prior to admission.  Pharmacy consulted for heparin  dosing.  CBC stable - Hgb 12.2, pltc 224.  Started on Brilinta  post cath, plan to transition to Plavix this evening with new triple therapy.  Received HSQ ppx this AM ~1000.  Goal of Therapy:  Heparin  level 0.3-0.7 units/ml Monitor platelets by anticoagulation protocol: Yes   Plan:  Heparin  bolus 2000u x1, then start 1250 units/hr 8 hour anti-Xa level Daily CBC, anti-Xa level Monitor for s/sx of bleeding F/u transition to DOAC, copay checks sent  Maurilio Fila, PharmD Clinical Pharmacist 05/22/2024  11:00 AM     [1]  Allergies Allergen Reactions   Atorvastatin  Other (See Comments)    Myopathy/weakness   Invokana   [Canagliflozin ] Other (See Comments)    weakness   Semaglutide  Other (See Comments)    Heartburn

## 2024-05-22 NOTE — Progress Notes (Signed)
" °  68 y/o male with recent large anterior infarct with delayed presentation. S/p PCI LAD  cMRI today with EF 30% moderate post-infarct effusion without tamponade + LV clot  Throughout the afternoon has developed progressive HF/shock with worsening co-ox and rising lactic acid despite increasing pressor support. Initially complaining of abdominal pain and SOB. Says he now feels a bit better  On exam  MAPs 60s HR 130-140s with AF JVP to ear Cor irreg tachy + s3 Lungs clear anteriorly Ab soft NT Ext cool 1+ edema  I have discussed the case with Drs. Mclean and Cooper.   Will plan to take patient to cath lab for swan placement and mechanical support.   Ideally would place Impella but LV clot is prohibitive. If has profound shock on RHC may need VA ECMO but otherwise will plan IABP.   Discussed with patient and his girlfriend.   Agree to proceed. ECMO team on standby.  Critical care time 65 minutes.  Toribio Fuel, MD  10:10 PM  "

## 2024-05-22 NOTE — Plan of Care (Signed)
" °  Problem: Education: Goal: Knowledge of General Education information will improve Description: Including pain rating scale, medication(s)/side effects and non-pharmacologic comfort measures Outcome: Progressing   Problem: Clinical Measurements: Goal: Ability to maintain clinical measurements within normal limits will improve Outcome: Progressing   Problem: Activity: Goal: Ability to tolerate increased activity will improve Outcome: Progressing   Problem: Activity: Goal: Ability to return to baseline activity level will improve Outcome: Progressing   Problem: Health Behavior/Discharge Planning: Goal: Ability to safely manage health-related needs after discharge will improve Outcome: Progressing   "

## 2024-05-22 NOTE — Progress Notes (Signed)
 Co-ox 47% this afternoon on 0.25 milrinone .  Increase milrinone  to 0.375. BP soft. Can use Norepi as needed. Orders placed with parameters.  Repeat co-ox in 2 hrs.   CVP 10. Check BMET now, will give additional IV lasix  if Scr stable.  Manuelita Dutch, PA-C Advanced Heart Failure

## 2024-05-22 NOTE — Telephone Encounter (Signed)
 Pharmacy Patient Advocate Encounter  Insurance verification completed.    The patient is insured through [employer]. Patient has Medicare and is not eligible for a copay card, but may be able to apply for patient assistance or Medicare RX Payment Plan (Patient Must reach out to their plan, if eligible for payment plan), if available.    Ran test claim for Entresto 24-26mg  tablet and the current 30 day co-pay is $47.  Ran test claim for Xarelto 20mg  tablet and the current 30 day co-pay is $47.  Ran test claim for Eliquis 5mg  tablet and the current 30 day co-pay is $47.  This test claim was processed through [employer]- copay amounts may vary at other pharmacies due to [employer], or as the patient moves through the different stages of their insurance plan.

## 2024-05-22 NOTE — Progress Notes (Addendum)
 "    Advanced Heart Failure Rounding Note  Cardiologist: None   AH[facility] Cardiologist: Dr. Rolan  Chief Complaint: Late presenting anterior MI, acute CHF Patient Profile   Philip Richardson is a 68 y.o. male with history of poorly controlled DM II, HTN, HLD and CKD IIIb. Admitted with late presenting anterior STEMI and acute systolic CHF.  Significant events:   12/18: LHC 100% p LAD treated with 3 overlapping DES, residual 90% p diagonal post PCI. LVEDP 24 mmHg.  Subjective:    Co-ox 54%.  Lactic acid 2.3>1.2>2.8 (evening 12/18).  CVP 10. Received 80 mg IV lasix  yesterday. Is/Os not complete. No weight.   Less chest discomfort today. Only notices discomfort with coughing fits. Had some orthopnea during MRI. Got lightheaded when up to chair.    Objective:      There is no height or weight on file to calculate BMI.   Vital Signs:   Temp:  [98.2 F (36.8 C)-98.9 F (37.2 C)] 98.4 F (36.9 C) (12/19 0700) Pulse Rate:  [65-107] 84 (12/19 0800) Resp:  [10-30] 26 (12/19 0934) BP: (89-132)/(59-87) 107/79 (12/19 0934) SpO2:  [95 %-100 %] 97 % (12/19 0800) Last BM Date :  (PTA)  Weight change: There were no vitals filed for this visit.  Intake/Output:   Intake/Output Summary (Last 24 hours) at 05/22/2024 1001 Last data filed at 05/22/2024 0500 Gross per 24 hour  Intake --  Output 475 ml  Net -475 ml     Physical Exam   General: Sitting up in bed. No distress. Cor: Regular rate & rhythm, tachy. No murmurs. Lungs: scattered crackles Abdomen: soft, nontender, nondistended. Extremities: no edema Neuro: alert & orientedx3. Affect pleasant   Telemetry   ST 100s  Labs   CBC Recent Labs    05/21/24 0840 05/21/24 0841 05/22/24 0415  WBC 8.8  --  17.5*  NEUTROABS 6.4  --   --   HGB 11.4* 11.6* 12.2*  HCT 34.1* 34.0* 36.2*  MCV 82.6  --  84.4  PLT 203  --  224   Basic Metabolic Panel Recent Labs    87/81/74 1855 05/22/24 0415  NA 132* 129*  K 4.8 4.8   CL 95* 93*  CO2 23 24  GLUCOSE 116* 120*  BUN 36* 40*  CREATININE 2.32* 2.68*  CALCIUM  9.4 9.2  MG  --  1.8   Liver Function Tests Recent Labs    05/21/24 0840 05/21/24 1855  AST 142* 254*  ALT 52* 58*  ALKPHOS 121 125  BILITOT 0.5 0.8  PROT 6.0* 6.5  ALBUMIN  3.3* 3.5   No results for input(s): LIPASE, AMYLASE in the last 72 hours. Cardiac Enzymes No results for input(s): CKTOTAL, CKMB, CKMBINDEX, TROPONINI in the last 72 hours.  BNP: BNP (last 3 results) No results for input(s): BNP in the last 8760 hours.  ProBNP (last 3 results) Recent Labs    05/21/24 1855  PROBNP 18,891.0*     D-Dimer No results for input(s): DDIMER in the last 72 hours. Hemoglobin A1C Recent Labs    05/21/24 0840  HGBA1C 13.1*   Fasting Lipid Panel Recent Labs    05/21/24 0840  CHOL 154  HDL 35*  LDLCALC 97  TRIG 885  CHOLHDL 4.5   Medications:   Scheduled Medications:  aspirin  EC  81 mg Oral Daily   benzonatate   200 mg Oral BID   Chlorhexidine  Gluconate Cloth  6 each Topical Q0600   colchicine   0.6 mg Oral Daily  famotidine   20 mg Oral Daily   heparin   5,000 Units Subcutaneous Q8H   insulin  aspart  0-15 Units Subcutaneous TID WC   insulin  glargine  50 Units Subcutaneous QHS   rosuvastatin   10 mg Oral Daily   sodium chloride  flush  10-40 mL Intracatheter Q12H   sodium chloride  flush  3 mL Intravenous Q12H   ticagrelor   90 mg Oral BID    Infusions:  sodium chloride       PRN Medications: sodium chloride , acetaminophen , guaiFENesin -dextromethorphan , morphine  injection, nitroGLYCERIN , ondansetron  (ZOFRAN ) IV, mouth rinse, oxyCODONE , phenol, sodium chloride  flush, sodium chloride  flush  Assessment/Plan   1. CAD: Late-presenting anterior STEMI, symptoms began on 12/14.  Cath was done showing occluded LAD that was treated with overlapping DES x 3.  There was residual 80% D1. Symptoms had a pleuritic character pre-cath, and he continues to have  significant pleuritic chest pain (not improved by PCI).  ECG post-cath showed persistent anterior STE.  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  No significant pericardial effusion present.  - Continue ASA 81 and ticagrelor .  - Continue Crestor  10 mg daily, has not been able to tolerate statins in the past so will start low dose.  - Continue colchicine  0.6 mg daily (renal dosing) given concern for post-infarct pericarditis, morphine  prn. Pain seems improved today 2. Acute systolic CHF: Ischemic cardiomyopathy.  Dr. Rolan reviewed echo, Normal LV size with mild LVH, EF 35-40% with peri-apical akinesis, possible apical thrombus noted on non-enhanced images, though cannot find it on the contrast-enhanced images (which were, however, poor); RV poorly visualized.  LVEDP 24 at cath. He is short of breath, difficult to tell how much of this is CHF vs post-infarct pericarditis and splinting.  - Lactic acid up to 2.8 last night. Recheck this am. - Co-ox 54% this am with Fick CI of 1.7. Start milrinone  at 0.25 mcg/kg/min to facilitate diuresis. - CVP 10. Exam difficult for volume. Having some orthopnea and cough. Start IV lasix  80 BID.  - Other GDMT will depend on creatinine trend and BP.   Eventually restart his home Farxiga , was on losartan at home and may be able to transition to Entresto.  3. AKI on CKD stage 3b: Suspect diabetic nephropathy. Creatinine 2.2>2.7 (baseline appears to be low 2s) - Suspect Scr worse in setting of contrast and possible low output 4. Type 2 DM: Poor control with HgbA1c 13.1.   - SSI + lantus  at bedtime - Diabetes coordinator consult.  - Eventually restart Farxiga .  5. Indeterminant echo for LV thrombus: Possible LV thrombus on non-enhanced images, but cannot see on contrast images.  - CMRI result pending  Length of Stay: 1  FINCH, LINDSAY N, PA-C  05/22/2024, 10:01 AM  Advanced Heart Failure Team Pager [phone] (M-F; 7a - 5p)   Please visit [email or web address]: For  overnight coverage please call cardiology fellow first. If fellow not available call Shock/ECMO MD on call.  For ECMO / Mechanical Support (Impella, IABP, LVAD) issues call Shock / ECMO MD on call.    Patient seen with PA, I formulated the plan nad agree with the above note.   I reviewed the cardiac MRI, LV EF 30% with peri-apical akinesis.  There is extensive near transmural delayed enhancement with associated no-reflow in the mid to apical septal and anterior walls as well as the apex. These areas do not appear viable.  Suspect small LV thrombus.  RV EF 48%. Small-moderate pericardial effusion with some pericardial enhancement towards the apex.   Co-ox  53.5%, CVP 9-10 on my read.  Creatinine 2.32 => 2.68.   He is more comfortable today, pleuritic CP is improved, only notices now with coughing.   He does report orthopnea.   General: NAD Neck: JVP 10-12 cm, no thyromegaly or thyroid  nodule.  Lungs: Clear to auscultation bilaterally with normal respiratory effort. CV: Nondisplaced PMI.  Heart regular S1/S2, no S3/S4, no murmur.  No peripheral edema.    Abdomen: Soft, nontender, no hepatosplenomegaly, no distention.  Skin: Intact without lesions or rashes.  Neurologic: Alert and oriented x 3.  Psych: Normal affect. Extremities: No clubbing or cyanosis.  HEENT: Normal.   Late presentation anterior MI with completed infarction involving the mid-apical septal and anterior walls and apex.  He has associated post-infarct pericarditis. No other complications of MI have so far been noted.  - Pericardial symptoms improving.  Continue colchicine .   There was a small apical thrombus noted by cardiac MRI.  I will stop ticagrelor  and replace with Plavix .  I will start heparin  gtt for LV thrombus, eventually needs Eliquis.  Has a small to moderate pericardial effusion so will need to beware of hemorrhagic conversion.   Low output with co-ox 53.5% and lactate higher today at 2.8.  Mild volume overload on  exam.  Creatinine up (baseline low 2's) to 2.6 today.  - Start milrinone  0.25 mcg/kg/min.  - Lasix  80 mg IV x 1 and follow response.   CRITICAL CARE Performed by: Ezra Shuck  Total critical care time: 45 minutes  Critical care time was exclusive of separately billable procedures and treating other patients.  Critical care was necessary to treat or prevent imminent or life-threatening deterioration.  Critical care was time spent personally by me on the following activities: development of treatment plan with patient and/or surrogate as well as nursing, discussions with consultants, evaluation of patient's response to treatment, examination of patient, obtaining history from patient or surrogate, ordering and performing treatments and interventions, ordering and review of laboratory studies, ordering and review of radiographic studies, pulse oximetry and re-evaluation of patient's condition.  Ezra Shuck 05/22/2024 12:22 PM   "

## 2024-05-23 ENCOUNTER — Encounter (HOSPITAL_COMMUNITY): Admission: EM | Disposition: E | Payer: Self-pay | Source: Home / Self Care | Attending: Internal Medicine

## 2024-05-23 ENCOUNTER — Inpatient Hospital Stay (HOSPITAL_COMMUNITY)

## 2024-05-23 DIAGNOSIS — N183 Chronic kidney disease, stage 3 unspecified: Secondary | ICD-10-CM

## 2024-05-23 DIAGNOSIS — N179 Acute kidney failure, unspecified: Secondary | ICD-10-CM | POA: Diagnosis not present

## 2024-05-23 DIAGNOSIS — R570 Cardiogenic shock: Secondary | ICD-10-CM | POA: Diagnosis not present

## 2024-05-23 DIAGNOSIS — J9601 Acute respiratory failure with hypoxia: Secondary | ICD-10-CM

## 2024-05-23 DIAGNOSIS — I2102 ST elevation (STEMI) myocardial infarction involving left anterior descending coronary artery: Secondary | ICD-10-CM | POA: Diagnosis not present

## 2024-05-23 HISTORY — PX: ECMO CANNULATION: CATH118321

## 2024-05-23 HISTORY — PX: CORONARY/GRAFT ACUTE MI REVASCULARIZATION: CATH118305

## 2024-05-23 LAB — CBC
HCT: 30 % — ABNORMAL LOW (ref 39.0–52.0)
HCT: 29.7 % — ABNORMAL LOW (ref 39.0–52.0)
HCT: 28.9 % — ABNORMAL LOW (ref 39.0–52.0)
Hemoglobin: 10.2 g/dL — ABNORMAL LOW (ref 13.0–17.0)
Hemoglobin: 9.9 g/dL — ABNORMAL LOW (ref 13.0–17.0)
Hemoglobin: 9.9 g/dL — ABNORMAL LOW (ref 13.0–17.0)
MCH: 28.1 pg (ref 26.0–34.0)
MCH: 28.2 pg (ref 26.0–34.0)
MCH: 28 pg (ref 26.0–34.0)
MCHC: 34 g/dL (ref 30.0–36.0)
MCHC: 33.3 g/dL (ref 30.0–36.0)
MCHC: 34.3 g/dL (ref 30.0–36.0)
MCV: 82.6 fL (ref 80.0–100.0)
MCV: 84.6 fL (ref 80.0–100.0)
MCV: 81.6 fL (ref 80.0–100.0)
Platelets: 247 K/uL (ref 150–400)
Platelets: 200 K/uL (ref 150–400)
Platelets: 152 K/uL (ref 150–400)
RBC: 3.63 MIL/uL — ABNORMAL LOW (ref 4.22–5.81)
RBC: 3.51 MIL/uL — ABNORMAL LOW (ref 4.22–5.81)
RBC: 3.54 MIL/uL — ABNORMAL LOW (ref 4.22–5.81)
RDW: 14.2 % (ref 11.5–15.5)
RDW: 14.8 % (ref 11.5–15.5)
RDW: 14.6 % (ref 11.5–15.5)
WBC: 16.8 K/uL — ABNORMAL HIGH (ref 4.0–10.5)
WBC: 15.6 K/uL — ABNORMAL HIGH (ref 4.0–10.5)
WBC: 8.9 K/uL (ref 4.0–10.5)
nRBC: 0 % (ref 0.0–0.2)
nRBC: 0 % (ref 0.0–0.2)
nRBC: 0 % (ref 0.0–0.2)

## 2024-05-23 LAB — POCT I-STAT 7, (LYTES, BLD GAS, ICA,H+H)
Acid-Base Excess: 3 mmol/L — ABNORMAL HIGH (ref 0.0–2.0)
Acid-Base Excess: 6 mmol/L — ABNORMAL HIGH (ref 0.0–2.0)
Acid-Base Excess: 9 mmol/L — ABNORMAL HIGH (ref 0.0–2.0)
Acid-Base Excess: 9 mmol/L — ABNORMAL HIGH (ref 0.0–2.0)
Acid-Base Excess: 9 mmol/L — ABNORMAL HIGH (ref 0.0–2.0)
Acid-base deficit: 2 mmol/L (ref 0.0–2.0)
Acid-base deficit: 9 mmol/L — ABNORMAL HIGH (ref 0.0–2.0)
Acid-base deficit: 7 mmol/L — ABNORMAL HIGH (ref 0.0–2.0)
Acid-base deficit: 8 mmol/L — ABNORMAL HIGH (ref 0.0–2.0)
Bicarbonate: 19.5 mmol/L — ABNORMAL LOW (ref 20.0–28.0)
Bicarbonate: 14.2 mmol/L — ABNORMAL LOW (ref 20.0–28.0)
Bicarbonate: 17.2 mmol/L — ABNORMAL LOW (ref 20.0–28.0)
Bicarbonate: 14.6 mmol/L — ABNORMAL LOW (ref 20.0–28.0)
Bicarbonate: 26.5 mmol/L (ref 20.0–28.0)
Bicarbonate: 29.7 mmol/L — ABNORMAL HIGH (ref 20.0–28.0)
Bicarbonate: 31.8 mmol/L — ABNORMAL HIGH (ref 20.0–28.0)
Bicarbonate: 31.6 mmol/L — ABNORMAL HIGH (ref 20.0–28.0)
Bicarbonate: 33.9 mmol/L — ABNORMAL HIGH (ref 20.0–28.0)
Calcium, Ion: 0.89 mmol/L — CL (ref 1.15–1.40)
Calcium, Ion: 0.91 mmol/L — ABNORMAL LOW (ref 1.15–1.40)
Calcium, Ion: 0.88 mmol/L — CL (ref 1.15–1.40)
Calcium, Ion: 0.9 mmol/L — ABNORMAL LOW (ref 1.15–1.40)
Calcium, Ion: 1.02 mmol/L — ABNORMAL LOW (ref 1.15–1.40)
Calcium, Ion: 1.02 mmol/L — ABNORMAL LOW (ref 1.15–1.40)
Calcium, Ion: 0.99 mmol/L — ABNORMAL LOW (ref 1.15–1.40)
Calcium, Ion: 1 mmol/L — ABNORMAL LOW (ref 1.15–1.40)
Calcium, Ion: 1.05 mmol/L — ABNORMAL LOW (ref 1.15–1.40)
HCT: 29 % — ABNORMAL LOW (ref 39.0–52.0)
HCT: 30 % — ABNORMAL LOW (ref 39.0–52.0)
HCT: 28 % — ABNORMAL LOW (ref 39.0–52.0)
HCT: 29 % — ABNORMAL LOW (ref 39.0–52.0)
HCT: 29 % — ABNORMAL LOW (ref 39.0–52.0)
HCT: 29 % — ABNORMAL LOW (ref 39.0–52.0)
HCT: 28 % — ABNORMAL LOW (ref 39.0–52.0)
HCT: 28 % — ABNORMAL LOW (ref 39.0–52.0)
HCT: 29 % — ABNORMAL LOW (ref 39.0–52.0)
Hemoglobin: 9.9 g/dL — ABNORMAL LOW (ref 13.0–17.0)
Hemoglobin: 10.2 g/dL — ABNORMAL LOW (ref 13.0–17.0)
Hemoglobin: 9.5 g/dL — ABNORMAL LOW (ref 13.0–17.0)
Hemoglobin: 9.9 g/dL — ABNORMAL LOW (ref 13.0–17.0)
Hemoglobin: 9.9 g/dL — ABNORMAL LOW (ref 13.0–17.0)
Hemoglobin: 9.9 g/dL — ABNORMAL LOW (ref 13.0–17.0)
Hemoglobin: 9.5 g/dL — ABNORMAL LOW (ref 13.0–17.0)
Hemoglobin: 9.5 g/dL — ABNORMAL LOW (ref 13.0–17.0)
Hemoglobin: 9.9 g/dL — ABNORMAL LOW (ref 13.0–17.0)
O2 Saturation: 97 %
O2 Saturation: 99 %
O2 Saturation: 99 %
O2 Saturation: 99 %
O2 Saturation: 96 %
O2 Saturation: 98 %
O2 Saturation: 99 %
O2 Saturation: 99 %
O2 Saturation: 99 %
Patient temperature: 98.7
Patient temperature: 36.6
Patient temperature: 35.9
Patient temperature: 36.6
Patient temperature: 36.6
Patient temperature: 36.6
Patient temperature: 36.3
Patient temperature: 36.6
Patient temperature: 36.6
Potassium: 4.5 mmol/L (ref 3.5–5.1)
Potassium: 4.1 mmol/L (ref 3.5–5.1)
Potassium: 4.2 mmol/L (ref 3.5–5.1)
Potassium: 3.7 mmol/L (ref 3.5–5.1)
Potassium: 3.4 mmol/L — ABNORMAL LOW (ref 3.5–5.1)
Potassium: 3.4 mmol/L — ABNORMAL LOW (ref 3.5–5.1)
Potassium: 3.6 mmol/L (ref 3.5–5.1)
Potassium: 3.5 mmol/L (ref 3.5–5.1)
Potassium: 3.5 mmol/L (ref 3.5–5.1)
Sodium: 128 mmol/L — ABNORMAL LOW (ref 135–145)
Sodium: 125 mmol/L — ABNORMAL LOW (ref 135–145)
Sodium: 127 mmol/L — ABNORMAL LOW (ref 135–145)
Sodium: 127 mmol/L — ABNORMAL LOW (ref 135–145)
Sodium: 131 mmol/L — ABNORMAL LOW (ref 135–145)
Sodium: 131 mmol/L — ABNORMAL LOW (ref 135–145)
Sodium: 131 mmol/L — ABNORMAL LOW (ref 135–145)
Sodium: 133 mmol/L — ABNORMAL LOW (ref 135–145)
Sodium: 133 mmol/L — ABNORMAL LOW (ref 135–145)
TCO2: 20 mmol/L — ABNORMAL LOW (ref 22–32)
TCO2: 15 mmol/L — ABNORMAL LOW (ref 22–32)
TCO2: 18 mmol/L — ABNORMAL LOW (ref 22–32)
TCO2: 15 mmol/L — ABNORMAL LOW (ref 22–32)
TCO2: 28 mmol/L (ref 22–32)
TCO2: 31 mmol/L (ref 22–32)
TCO2: 33 mmol/L — ABNORMAL HIGH (ref 22–32)
TCO2: 33 mmol/L — ABNORMAL HIGH (ref 22–32)
TCO2: 35 mmol/L — ABNORMAL HIGH (ref 22–32)
pCO2 arterial: 23.7 mmHg — ABNORMAL LOW (ref 32–48)
pCO2 arterial: 22.2 mmHg — ABNORMAL LOW (ref 32–48)
pCO2 arterial: 26.4 mmHg — ABNORMAL LOW (ref 32–48)
pCO2 arterial: 21.4 mmHg — ABNORMAL LOW (ref 32–48)
pCO2 arterial: 36 mmHg (ref 32–48)
pCO2 arterial: 39.2 mmHg (ref 32–48)
pCO2 arterial: 36.4 mmHg (ref 32–48)
pCO2 arterial: 34.3 mmHg (ref 32–48)
pCO2 arterial: 46.2 mmHg (ref 32–48)
pH, Arterial: 7.525 — ABNORMAL HIGH (ref 7.35–7.45)
pH, Arterial: 7.413 (ref 7.35–7.45)
pH, Arterial: 7.418 (ref 7.35–7.45)
pH, Arterial: 7.44 (ref 7.35–7.45)
pH, Arterial: 7.474 — ABNORMAL HIGH (ref 7.35–7.45)
pH, Arterial: 7.487 — ABNORMAL HIGH (ref 7.35–7.45)
pH, Arterial: 7.547 — ABNORMAL HIGH (ref 7.35–7.45)
pH, Arterial: 7.57 — ABNORMAL HIGH (ref 7.35–7.45)
pH, Arterial: 7.473 — ABNORMAL HIGH (ref 7.35–7.45)
pO2, Arterial: 81 mmHg — ABNORMAL LOW (ref 83–108)
pO2, Arterial: 123 mmHg — ABNORMAL HIGH (ref 83–108)
pO2, Arterial: 137 mmHg — ABNORMAL HIGH (ref 83–108)
pO2, Arterial: 117 mmHg — ABNORMAL HIGH (ref 83–108)
pO2, Arterial: 75 mmHg — ABNORMAL LOW (ref 83–108)
pO2, Arterial: 96 mmHg (ref 83–108)
pO2, Arterial: 104 mmHg (ref 83–108)
pO2, Arterial: 108 mmHg (ref 83–108)
pO2, Arterial: 108 mmHg (ref 83–108)

## 2024-05-23 LAB — CG4 I-STAT (LACTIC ACID)
Lactic Acid, Venous: 6.2 mmol/L (ref 0.5–1.9)
Lactic Acid, Venous: 7 mmol/L (ref 0.5–1.9)
Lactic Acid, Venous: 8.6 mmol/L (ref 0.5–1.9)
Lactic Acid, Venous: 11.9 mmol/L (ref 0.5–1.9)
Lactic Acid, Venous: 14.3 mmol/L (ref 0.5–1.9)
Lactic Acid, Venous: 13.6 mmol/L (ref 0.5–1.9)
Lactic Acid, Venous: 9.5 mmol/L (ref 0.5–1.9)
Lactic Acid, Venous: 4.5 mmol/L (ref 0.5–1.9)
Lactic Acid, Venous: 1.8 mmol/L (ref 0.5–1.9)

## 2024-05-23 LAB — POCT I-STAT EG7
Acid-Base Excess: 0 mmol/L (ref 0.0–2.0)
Acid-base deficit: 14 mmol/L — ABNORMAL HIGH (ref 0.0–2.0)
Bicarbonate: 23.9 mmol/L (ref 20.0–28.0)
Bicarbonate: 10.6 mmol/L — ABNORMAL LOW (ref 20.0–28.0)
Calcium, Ion: 0.78 mmol/L — CL (ref 1.15–1.40)
Calcium, Ion: 0.88 mmol/L — CL (ref 1.15–1.40)
HCT: 23 % — ABNORMAL LOW (ref 39.0–52.0)
HCT: 30 % — ABNORMAL LOW (ref 39.0–52.0)
Hemoglobin: 7.8 g/dL — ABNORMAL LOW (ref 13.0–17.0)
Hemoglobin: 10.2 g/dL — ABNORMAL LOW (ref 13.0–17.0)
O2 Saturation: 99 %
O2 Saturation: 98 %
Patient temperature: 30
Potassium: 3.8 mmol/L (ref 3.5–5.1)
Potassium: 4.2 mmol/L (ref 3.5–5.1)
Sodium: 131 mmol/L — ABNORMAL LOW (ref 135–145)
Sodium: 125 mmol/L — ABNORMAL LOW (ref 135–145)
TCO2: 25 mmol/L (ref 22–32)
TCO2: 11 mmol/L — ABNORMAL LOW (ref 22–32)
pCO2, Ven: 24.6 mmHg — ABNORMAL LOW (ref 44–60)
pCO2, Ven: 20.6 mmHg — ABNORMAL LOW (ref 44–60)
pH, Ven: 7.569 — ABNORMAL HIGH (ref 7.25–7.43)
pH, Ven: 7.318 (ref 7.25–7.43)
pO2, Ven: 77 mmHg — ABNORMAL HIGH (ref 32–45)
pO2, Ven: 103 mmHg — ABNORMAL HIGH (ref 32–45)

## 2024-05-23 LAB — GLOBAL TEG PANEL
CFF Max Amplitude: 43 mm — ABNORMAL HIGH (ref 15–32)
CK with Heparinase (R): 11.2 min — ABNORMAL HIGH (ref 4.3–8.3)
Citrated Functional Fibrinogen: 784.7 mg/dL — ABNORMAL HIGH (ref 278–581)
Citrated Kaolin (MA): <40 mm — ABNORMAL LOW (ref 52–69)
Citrated Kaolin (R): >17 min — ABNORMAL HIGH (ref 4.6–9.1)
Citrated Kaolin Angle: <39 deg — ABNORMAL LOW (ref 63–78)
Citrated Rapid TEG (MA): 71.3 mm — ABNORMAL HIGH (ref 52–70)

## 2024-05-23 LAB — COOXEMETRY PANEL
Carboxyhemoglobin: 1 % (ref 0.5–1.5)
Carboxyhemoglobin: 1.5 % (ref 0.5–1.5)
Carboxyhemoglobin: 0.6 % (ref 0.5–1.5)
Carboxyhemoglobin: 1.3 % (ref 0.5–1.5)
Methemoglobin: <0.7 % (ref 0.0–1.5)
Methemoglobin: <0.7 % (ref 0.0–1.5)
Methemoglobin: <0.7 % (ref 0.0–1.5)
Methemoglobin: <0.7 % (ref 0.0–1.5)
O2 Saturation: 56.1 %
O2 Saturation: 66.4 %
O2 Saturation: 68.8 %
O2 Saturation: 79.3 %
Total hemoglobin: 11.4 g/dL — ABNORMAL LOW (ref 12.0–16.0)
Total hemoglobin: 9.9 g/dL — ABNORMAL LOW (ref 12.0–16.0)
Total hemoglobin: 10.6 g/dL — ABNORMAL LOW (ref 12.0–16.0)
Total hemoglobin: 10.5 g/dL — ABNORMAL LOW (ref 12.0–16.0)

## 2024-05-23 LAB — GLUCOSE, CAPILLARY
Glucose-Capillary: 133 mg/dL — ABNORMAL HIGH (ref 70–99)
Glucose-Capillary: 146 mg/dL — ABNORMAL HIGH (ref 70–99)
Glucose-Capillary: 172 mg/dL — ABNORMAL HIGH (ref 70–99)
Glucose-Capillary: 207 mg/dL — ABNORMAL HIGH (ref 70–99)
Glucose-Capillary: 262 mg/dL — ABNORMAL HIGH (ref 70–99)
Glucose-Capillary: 354 mg/dL — ABNORMAL HIGH (ref 70–99)
Glucose-Capillary: 360 mg/dL — ABNORMAL HIGH (ref 70–99)
Glucose-Capillary: 372 mg/dL — ABNORMAL HIGH (ref 70–99)
Glucose-Capillary: 381 mg/dL — ABNORMAL HIGH (ref 70–99)
Glucose-Capillary: 449 mg/dL — ABNORMAL HIGH (ref 70–99)
Glucose-Capillary: 473 mg/dL — ABNORMAL HIGH (ref 70–99)
Glucose-Capillary: 481 mg/dL — ABNORMAL HIGH (ref 70–99)
Glucose-Capillary: 497 mg/dL — ABNORMAL HIGH (ref 70–99)
Glucose-Capillary: 501 mg/dL (ref 70–99)
Glucose-Capillary: 542 mg/dL (ref 70–99)
Glucose-Capillary: 66 mg/dL — ABNORMAL LOW (ref 70–99)
Glucose-Capillary: 92 mg/dL (ref 70–99)

## 2024-05-23 LAB — MAGNESIUM
Magnesium: 2.2 mg/dL (ref 1.7–2.4)
Magnesium: 2.1 mg/dL (ref 1.7–2.4)

## 2024-05-23 LAB — URINALYSIS, COMPLETE (UACMP) WITH MICROSCOPIC
Bacteria, UA: NONE SEEN
Bilirubin Urine: NEGATIVE
Glucose, UA: >=500 mg/dL — AB
Ketones, ur: NEGATIVE mg/dL
Leukocytes,Ua: NEGATIVE
Nitrite: NEGATIVE
Protein, ur: NEGATIVE mg/dL
Specific Gravity, Urine: 1.01 (ref 1.005–1.030)
pH: 5 (ref 5.0–8.0)

## 2024-05-23 LAB — LIPOPROTEIN A (LPA): Lipoprotein (a): 98.5 nmol/L — ABNORMAL HIGH

## 2024-05-23 LAB — BASIC METABOLIC PANEL WITH GFR
Anion gap: 31 — ABNORMAL HIGH (ref 5–15)
Anion gap: 31 — ABNORMAL HIGH (ref 5–15)
Anion gap: 17 — ABNORMAL HIGH (ref 5–15)
BUN: 50 mg/dL — ABNORMAL HIGH (ref 8–23)
BUN: 48 mg/dL — ABNORMAL HIGH (ref 8–23)
BUN: 48 mg/dL — ABNORMAL HIGH (ref 8–23)
CO2: 14 mmol/L — ABNORMAL LOW (ref 22–32)
CO2: 18 mmol/L — ABNORMAL LOW (ref 22–32)
CO2: 31 mmol/L (ref 22–32)
Calcium: 7.9 mg/dL — ABNORMAL LOW (ref 8.9–10.3)
Calcium: 7.7 mg/dL — ABNORMAL LOW (ref 8.9–10.3)
Calcium: 8.5 mg/dL — ABNORMAL LOW (ref 8.9–10.3)
Chloride: 82 mmol/L — ABNORMAL LOW (ref 98–111)
Chloride: 81 mmol/L — ABNORMAL LOW (ref 98–111)
Chloride: 87 mmol/L — ABNORMAL LOW (ref 98–111)
Creatinine, Ser: 3.76 mg/dL — ABNORMAL HIGH (ref 0.61–1.24)
Creatinine, Ser: 3.79 mg/dL — ABNORMAL HIGH (ref 0.61–1.24)
Creatinine, Ser: 3.58 mg/dL — ABNORMAL HIGH (ref 0.61–1.24)
GFR, Estimated: 17 mL/min — ABNORMAL LOW
GFR, Estimated: 17 mL/min — ABNORMAL LOW
GFR, Estimated: 18 mL/min — ABNORMAL LOW
Glucose, Bld: 480 mg/dL — ABNORMAL HIGH (ref 70–99)
Glucose, Bld: 594 mg/dL (ref 70–99)
Glucose, Bld: 439 mg/dL — ABNORMAL HIGH (ref 70–99)
Potassium: 4.3 mmol/L (ref 3.5–5.1)
Potassium: 4.3 mmol/L (ref 3.5–5.1)
Potassium: 3.7 mmol/L (ref 3.5–5.1)
Sodium: 127 mmol/L — ABNORMAL LOW (ref 135–145)
Sodium: 131 mmol/L — ABNORMAL LOW (ref 135–145)
Sodium: 135 mmol/L (ref 135–145)

## 2024-05-23 LAB — LACTATE DEHYDROGENASE: LDH: 805 U/L — ABNORMAL HIGH (ref 105–235)

## 2024-05-23 LAB — POCT ACTIVATED CLOTTING TIME
Activated Clotting Time: 194 s
Activated Clotting Time: 184 s
Activated Clotting Time: 194 s

## 2024-05-23 LAB — APTT
aPTT: >200 s (ref 24–36)
aPTT: 82 s — ABNORMAL HIGH (ref 24–36)
aPTT: 52 s — ABNORMAL HIGH (ref 24–36)

## 2024-05-23 LAB — HEPATIC FUNCTION PANEL
ALT: 239 U/L — ABNORMAL HIGH (ref 0–44)
AST: 322 U/L — ABNORMAL HIGH (ref 15–41)
Albumin: 2.3 g/dL — ABNORMAL LOW (ref 3.5–5.0)
Alkaline Phosphatase: 89 U/L (ref 38–126)
Bilirubin, Direct: 0.7 mg/dL — ABNORMAL HIGH (ref 0.0–0.2)
Indirect Bilirubin: 0.3 mg/dL (ref 0.3–0.9)
Total Bilirubin: 1 mg/dL (ref 0.0–1.2)
Total Protein: 4.3 g/dL — ABNORMAL LOW (ref 6.5–8.1)

## 2024-05-23 LAB — PROTIME-INR
INR: 2.3 — ABNORMAL HIGH (ref 0.8–1.2)
Prothrombin Time: 26.6 s — ABNORMAL HIGH (ref 11.4–15.2)

## 2024-05-23 LAB — FIBRINOGEN: Fibrinogen: 665 mg/dL — ABNORMAL HIGH (ref 210–475)

## 2024-05-23 LAB — HEPARIN LEVEL (UNFRACTIONATED): Heparin Unfractionated: 0.72 [IU]/mL — ABNORMAL HIGH (ref 0.30–0.70)

## 2024-05-23 LAB — PREPARE RBC (CROSSMATCH)

## 2024-05-23 LAB — BETA-HYDROXYBUTYRIC ACID: Beta-Hydroxybutyric Acid: 0.08 mmol/L (ref 0.05–0.27)

## 2024-05-23 MED ORDER — ALBUMIN HUMAN 5 % IV SOLN
12.5000 g | INTRAVENOUS | Status: DC | PRN
  Administered 2024-05-23 (×2): 12.5 g via INTRAVENOUS

## 2024-05-23 MED ORDER — CALCIUM GLUCONATE-NACL 2-0.675 GM/100ML-% IV SOLN
2.0000 g | Freq: Once | INTRAVENOUS | Status: AC
  Administered 2024-05-23: 2000 mg via INTRAVENOUS
  Filled 2024-05-23: qty 100

## 2024-05-23 MED ORDER — MIDAZOLAM HCL 2 MG/2ML IJ SOLN
INTRAMUSCULAR | Status: AC
  Filled 2024-05-23: qty 2

## 2024-05-23 MED ORDER — EPINEPHRINE HCL 5 MG/250ML IV SOLN IN NS
INTRAVENOUS | Status: AC
  Administered 2024-05-23: 4 ug/min via INTRAVENOUS
  Filled 2024-05-23: qty 250

## 2024-05-23 MED ORDER — DEXMEDETOMIDINE HCL IN NACL 400 MCG/100ML IV SOLN
INTRAVENOUS | Status: AC
  Filled 2024-05-23: qty 100

## 2024-05-23 MED ORDER — LIDOCAINE HCL (PF) 1 % IJ SOLN
INTRAMUSCULAR | Status: DC | PRN
  Administered 2024-05-22: 2 mL

## 2024-05-23 MED ORDER — VANCOMYCIN VARIABLE DOSE PER UNSTABLE RENAL FUNCTION (PHARMACIST DOSING): Status: DC

## 2024-05-23 MED ORDER — CALCIUM GLUCONATE-NACL 2-0.675 GM/100ML-% IV SOLN
2.0000 g | Freq: Once | INTRAVENOUS | Status: AC
  Filled 2024-05-23: qty 100

## 2024-05-23 MED ORDER — SODIUM CHLORIDE 0.9% IV SOLUTION: INTRAVENOUS | Status: DC | PRN

## 2024-05-23 MED ORDER — DEXMEDETOMIDINE HCL IN NACL 400 MCG/100ML IV SOLN
INTRAVENOUS | Status: AC
  Administered 2024-05-23: 0.2 ug
  Filled 2024-05-23: qty 100

## 2024-05-23 MED ORDER — HYDRALAZINE HCL 20 MG/ML IJ SOLN: 10.0000 mg | INTRAMUSCULAR | Status: AC | PRN

## 2024-05-23 MED ORDER — POTASSIUM CHLORIDE 10 MEQ/50ML IV SOLN
10.0000 meq | INTRAVENOUS | Status: AC
  Administered 2024-05-23 (×2): 10 meq via INTRAVENOUS

## 2024-05-23 MED ORDER — SODIUM CHLORIDE 0.9% IV SOLUTION: INTRAVENOUS | Status: DC

## 2024-05-23 MED ORDER — CALCIUM GLUCONATE-NACL 2-0.675 GM/100ML-% IV SOLN
INTRAVENOUS | Status: AC
  Administered 2024-05-23: 2 g via INTRAVENOUS
  Filled 2024-05-23: qty 100

## 2024-05-23 MED ORDER — CALCIUM CHLORIDE 10 % IV SOLN
1.0000 g | Freq: Once | INTRAVENOUS | Status: AC
  Administered 2024-05-23: 1 g via INTRAVENOUS

## 2024-05-23 MED ORDER — SODIUM CHLORIDE 0.9% IV SOLUTION: Freq: Once | INTRAVENOUS | Status: DC

## 2024-05-23 MED ORDER — PHENYLEPHRINE 80 MCG/ML (10ML) SYRINGE FOR IV PUSH (FOR BLOOD PRESSURE SUPPORT)
PREFILLED_SYRINGE | INTRAVENOUS | Status: AC
  Filled 2024-05-23: qty 20

## 2024-05-23 MED ORDER — HEPARIN (PORCINE) IN NACL 1000-0.9 UT/500ML-% IV SOLN
INTRAVENOUS | Status: DC | PRN
  Administered 2024-05-23: 500 mL

## 2024-05-23 MED ORDER — DOBUTAMINE-DEXTROSE 4-5 MG/ML-% IV SOLN
INTRAVENOUS | Status: AC
  Administered 2024-05-23: 5 ug/kg/min via INTRAVENOUS
  Filled 2024-05-23: qty 250

## 2024-05-23 MED ORDER — PHENYLEPHRINE 80 MCG/ML (10ML) SYRINGE FOR IV PUSH (FOR BLOOD PRESSURE SUPPORT)
PREFILLED_SYRINGE | INTRAVENOUS | Status: DC | PRN
  Administered 2024-05-23: 160 ug via INTRAVENOUS
  Administered 2024-05-23: 80 ug via INTRAVENOUS
  Administered 2024-05-23 (×2): 160 ug via INTRAVENOUS

## 2024-05-23 MED ORDER — METHYLENE BLUE (ANTIDOTE) 1 % IV SOLN
2.0000 mg/kg | Freq: Once | INTRAVENOUS | Status: AC
  Administered 2024-05-23: 168 mg via INTRAVENOUS
  Filled 2024-05-23: qty 16.8

## 2024-05-23 MED ORDER — DEXTROSE 50 % IV SOLN
0.0000 mL | INTRAVENOUS | Status: DC | PRN
  Administered 2024-05-23: 45 mL via INTRAVENOUS
  Administered 2024-05-24 (×2): 50 mL via INTRAVENOUS
  Filled 2024-05-23 (×4): qty 50

## 2024-05-23 MED ORDER — SODIUM CHLORIDE 0.9 % IV SOLN
12.5000 mg | Freq: Once | INTRAVENOUS | Status: DC
  Filled 2024-05-23: qty 0.5

## 2024-05-23 MED ORDER — SODIUM CHLORIDE 0.9% FLUSH: 3.0000 mL | INTRAVENOUS | Status: DC | PRN

## 2024-05-23 MED ORDER — MIDAZOLAM HCL (PF) 2 MG/2ML IJ SOLN
INTRAMUSCULAR | Status: DC | PRN
  Administered 2024-05-23: 1 mg via INTRAVENOUS

## 2024-05-23 MED ORDER — LIDOCAINE HCL (PF) 1 % IJ SOLN
INTRAMUSCULAR | Status: DC | PRN
  Administered 2024-05-23: 5 mL

## 2024-05-23 MED ORDER — SODIUM BICARBONATE 8.4 % IV SOLN
50.0000 meq | Freq: Once | INTRAVENOUS | Status: AC
  Filled 2024-05-23: qty 50

## 2024-05-23 MED ORDER — SODIUM CHLORIDE 0.9 % IV SOLN: 4.0000 g | Freq: Once | INTRAVENOUS | Status: DC

## 2024-05-23 MED ORDER — LIDOCAINE HCL (PF) 1 % IJ SOLN
INTRAMUSCULAR | Status: AC
  Filled 2024-05-23: qty 30

## 2024-05-23 MED ORDER — SODIUM CHLORIDE 0.9 % IV SOLN
2.0000 g | Freq: Once | INTRAVENOUS | Status: DC
  Filled 2024-05-23 (×2): qty 12.5

## 2024-05-23 MED ORDER — VANCOMYCIN HCL 1750 MG/350ML IV SOLN
1750.0000 mg | Freq: Once | INTRAVENOUS | Status: AC
  Administered 2024-05-23: 1750 mg via INTRAVENOUS
  Filled 2024-05-23: qty 350

## 2024-05-23 MED ORDER — LACTATED RINGERS IV SOLN: INTRAVENOUS | Status: DC

## 2024-05-23 MED ORDER — EPINEPHRINE 1 MG/10ML IV SOSY
PREFILLED_SYRINGE | INTRAVENOUS | Status: DC | PRN
  Administered 2024-05-23: 10 mg via INTRAVENOUS

## 2024-05-23 MED ORDER — SODIUM CHLORIDE 0.9 % IV SOLN: INTRAVENOUS | Status: DC

## 2024-05-23 MED ORDER — SODIUM BICARBONATE 8.4 % IV SOLN: 100.0000 meq | Freq: Once | INTRAVENOUS | Status: AC

## 2024-05-23 MED ORDER — DOBUTAMINE-DEXTROSE 4-5 MG/ML-% IV SOLN
5.0000 ug/kg/min | INTRAVENOUS | Status: DC
  Administered 2024-05-23: 5 ug/kg/min via INTRAVENOUS
  Administered 2024-05-23: 2.5 ug/kg/min via INTRAVENOUS
  Filled 2024-05-23 (×2): qty 250

## 2024-05-23 MED ORDER — SODIUM BICARBONATE 8.4 % IV SOLN
INTRAVENOUS | Status: DC
  Filled 2024-05-23: qty 1000

## 2024-05-23 MED ORDER — PANTOPRAZOLE SODIUM 40 MG IV SOLR
40.0000 mg | Freq: Every day | INTRAVENOUS | Status: DC
  Administered 2024-05-23 – 2024-05-29 (×7): 40 mg via INTRAVENOUS
  Filled 2024-05-23 (×7): qty 10

## 2024-05-23 MED ORDER — SODIUM CHLORIDE 0.9 % IV SOLN: 250.0000 mL | INTRAVENOUS | Status: DC | PRN

## 2024-05-23 MED ORDER — SODIUM BICARBONATE 8.4 % IV SOLN
INTRAVENOUS | Status: AC
  Administered 2024-05-23: 100 meq via INTRAVENOUS
  Filled 2024-05-23: qty 100

## 2024-05-23 MED ORDER — SODIUM CHLORIDE 0.9% FLUSH
3.0000 mL | Freq: Two times a day (BID) | INTRAVENOUS | Status: DC
  Administered 2024-05-23 (×2): 3 mL via INTRAVENOUS

## 2024-05-23 MED ORDER — ACETAMINOPHEN 325 MG PO TABS: 650.0000 mg | ORAL_TABLET | ORAL | Status: DC | PRN

## 2024-05-23 MED ORDER — NOREPINEPHRINE 16 MG/250ML-% IV SOLN
0.0000 ug/min | INTRAVENOUS | Status: DC
  Administered 2024-05-23: 40 ug/min via INTRAVENOUS
  Administered 2024-05-26: 5 ug/min via INTRAVENOUS
  Administered 2024-05-26: 33 ug/min via INTRAVENOUS
  Administered 2024-05-27: 12 ug/min via INTRAVENOUS
  Administered 2024-05-28: 4 ug/min via INTRAVENOUS
  Administered 2024-05-29: 15 ug/min via INTRAVENOUS
  Administered 2024-05-30: 10 ug/min via INTRAVENOUS
  Administered 2024-05-30: 65 ug/min via INTRAVENOUS
  Filled 2024-05-23: qty 250
  Filled 2024-05-23: qty 500
  Filled 2024-05-23 (×6): qty 250

## 2024-05-23 MED ORDER — STERILE WATER FOR INJECTION IV SOLN
INTRAVENOUS | Status: DC
  Filled 2024-05-23: qty 1000

## 2024-05-23 MED ORDER — ASPIRIN 81 MG PO CHEW
81.0000 mg | CHEWABLE_TABLET | Freq: Every day | ORAL | Status: DC
  Administered 2024-05-24 – 2024-05-30 (×7): 81 mg
  Filled 2024-05-23 (×8): qty 1

## 2024-05-23 MED ORDER — SODIUM BICARBONATE 8.4 % IV SOLN
INTRAVENOUS | Status: AC
  Filled 2024-05-23: qty 50

## 2024-05-23 MED ORDER — ONDANSETRON HCL 4 MG/2ML IJ SOLN
4.0000 mg | Freq: Four times a day (QID) | INTRAMUSCULAR | Status: DC | PRN
  Administered 2024-05-25: 4 mg via INTRAVENOUS
  Filled 2024-05-23: qty 2

## 2024-05-23 MED ORDER — CLOPIDOGREL BISULFATE 75 MG PO TABS
75.0000 mg | ORAL_TABLET | Freq: Every day | ORAL | Status: DC
  Filled 2024-05-23: qty 1

## 2024-05-23 MED ORDER — SODIUM CHLORIDE 0.9 % IV SOLN
0.0200 mg/kg/h | INTRAVENOUS | Status: DC
  Administered 2024-05-23: 0.01 mg/kg/h via INTRAVENOUS
  Administered 2024-05-26 – 2024-05-27 (×2): 0.02 mg/kg/h via INTRAVENOUS
  Filled 2024-05-23 (×4): qty 250

## 2024-05-23 MED ORDER — INSULIN REGULAR(HUMAN) IN NACL 100-0.9 UT/100ML-% IV SOLN
INTRAVENOUS | Status: DC
  Administered 2024-05-23: 2 [IU]/h via INTRAVENOUS
  Administered 2024-05-23: 32 [IU]/h via INTRAVENOUS
  Administered 2024-05-23: 22 [IU]/h via INTRAVENOUS
  Filled 2024-05-23 (×3): qty 100

## 2024-05-23 MED ORDER — EPINEPHRINE HCL 5 MG/250ML IV SOLN IN NS
0.5000 ug/min | INTRAVENOUS | Status: DC
  Filled 2024-05-23 (×2): qty 250

## 2024-05-23 MED ORDER — SODIUM BICARBONATE 8.4 % IV SOLN
200.0000 meq | Freq: Once | INTRAVENOUS | Status: AC
  Administered 2024-05-23: 200 meq via INTRAVENOUS

## 2024-05-23 MED ORDER — DEXMEDETOMIDINE HCL IN NACL 400 MCG/100ML IV SOLN
0.0000 ug/kg/h | INTRAVENOUS | Status: DC
  Administered 2024-05-23: 0.2 ug/kg/h via INTRAVENOUS
  Administered 2024-05-23: 0.5 ug/kg/h via INTRAVENOUS
  Filled 2024-05-23: qty 100

## 2024-05-23 MED ORDER — ALBUMIN HUMAN 25 % IV SOLN: 12.5000 g | INTRAVENOUS | Status: DC | PRN

## 2024-05-23 MED ORDER — SODIUM BICARBONATE 8.4 % IV SOLN
100.0000 meq | Freq: Once | INTRAVENOUS | Status: AC
  Administered 2024-05-23: 100 meq via INTRAVENOUS

## 2024-05-23 MED ORDER — FENTANYL CITRATE (PF) 50 MCG/ML IJ SOSY
25.0000 ug | PREFILLED_SYRINGE | Freq: Once | INTRAMUSCULAR | Status: AC
  Administered 2024-05-23: 25 ug via INTRAVENOUS
  Filled 2024-05-23: qty 1

## 2024-05-23 MED ORDER — ALBUMIN HUMAN 5 % IV SOLN: 12.5000 g | Freq: Once | INTRAVENOUS | Status: DC

## 2024-05-23 MED ORDER — SODIUM CHLORIDE 0.9 % IV SOLN
0.0250 mg/kg/h | INTRAVENOUS | Status: DC
  Filled 2024-05-23: qty 250

## 2024-05-23 MED ORDER — METOCLOPRAMIDE HCL 5 MG/ML IJ SOLN
5.0000 mg | Freq: Three times a day (TID) | INTRAMUSCULAR | Status: DC
  Administered 2024-05-23 – 2024-05-30 (×22): 5 mg via INTRAVENOUS
  Filled 2024-05-23 (×21): qty 2

## 2024-05-23 MED ORDER — DEXTROSE IN LACTATED RINGERS 5 % IV SOLN: INTRAVENOUS | Status: DC

## 2024-05-23 MED ORDER — CLOPIDOGREL BISULFATE 300 MG PO TABS
ORAL_TABLET | ORAL | Status: AC
  Filled 2024-05-23: qty 2

## 2024-05-23 MED ORDER — VANCOMYCIN HCL IN DEXTROSE 1-5 GM/200ML-% IV SOLN
INTRAVENOUS | Status: AC
  Filled 2024-05-23: qty 200

## 2024-05-23 MED ORDER — SODIUM CHLORIDE 0.9 % IV SOLN
0.5000 ug/min | INTRAVENOUS | Status: DC
  Administered 2024-05-23: 20 ug/min via INTRAVENOUS
  Administered 2024-05-26: 0.5 ug/min via INTRAVENOUS
  Administered 2024-05-28: 2.5 ug/min via INTRAVENOUS
  Administered 2024-05-30: 10 ug/min via INTRAVENOUS
  Filled 2024-05-23 (×6): qty 10

## 2024-05-23 MED ORDER — SODIUM CHLORIDE 0.9 % IV SOLN
500.0000 mg | Freq: Two times a day (BID) | INTRAVENOUS | Status: DC
  Administered 2024-05-23 – 2024-05-29 (×13): 500 mg via INTRAVENOUS
  Filled 2024-05-23 (×14): qty 10

## 2024-05-23 MED ORDER — LACTATED RINGERS IV BOLUS: 250.0000 mL | Freq: Once | INTRAVENOUS | Status: DC

## 2024-05-23 MED ORDER — CLOPIDOGREL BISULFATE 75 MG PO TABS
75.0000 mg | ORAL_TABLET | Freq: Every day | ORAL | Status: DC
  Administered 2024-05-23 – 2024-05-30 (×8): 75 mg
  Filled 2024-05-23 (×8): qty 1

## 2024-05-23 MED ORDER — HYDROCORTISONE SOD SUC (PF) 100 MG IJ SOLR
100.0000 mg | Freq: Three times a day (TID) | INTRAMUSCULAR | Status: DC
  Administered 2024-05-23: 100 mg via INTRAVENOUS
  Filled 2024-05-23: qty 2

## 2024-05-23 MED ORDER — ROSUVASTATIN CALCIUM 5 MG PO TABS
10.0000 mg | ORAL_TABLET | Freq: Every day | ORAL | Status: DC
  Administered 2024-05-23 – 2024-05-25 (×3): 10 mg
  Filled 2024-05-23 (×3): qty 2

## 2024-05-23 NOTE — CV Procedure (Signed)
 ECMO DAILY NOTE:   Indication: Cardiogenic shock   Initial cannulation date: 05/23/24   ECMO type: VA ECMO   1) 25 FR multi-stage venous drainage in R CFV 2) 19 FR return cannula in L CFA     Daily data:   Flow 4.96 L RPM 3680 Sweep  2 L   Labs:   ABG    Component Value Date/Time   PHART 7.413 05/23/2024 0510   PCO2ART 22.2 (L) 05/23/2024 0510   PO2ART 123 (H) 05/23/2024 0510   HCO3 14.2 (L) 05/23/2024 0510   TCO2 15 (L) 05/23/2024 0510   ACIDBASEDEF 9.0 (H) 05/23/2024 0510   O2SAT 99 05/23/2024 0510    Hgb 7.8 Platelets pending  LDH pending PTT pending  Lactic acid 11.9 (pre-cannulation)   Plan:  Continue ECMO support with IABP vent Not Impella candidate with LV clot Goal for recovery    Toribio Fuel, MD  7:31 AM

## 2024-05-23 NOTE — Progress Notes (Signed)
 " ECMO INITIATION   Patient: Philip Richardson, [birth year], 68 y.o. Location:   Date of Service:  05/23/2024     Time: 8:15 AM  Date of Admission: 05/21/2024 Admitting diagnosis: Cardiogenic shock (HCC)  Ht:   Wt: 84.2 kg BSA: Body surface area is 2.08 meters squared.  Blood Type: A POS Allergies: Allergies[1]  Past medical history:  Past Medical History:  Diagnosis Date   Frequency of urination    GERD (gastroesophageal reflux disease)    Horseshoe kidney    BILATERAL   Hypertension    Renal calculus, bilateral    Type 2 diabetes mellitus (HCC)    Urgency of urination    Wears dentures    Past surgical history:  Past Surgical History:  Procedure Laterality Date   BIOPSY  04/25/2020   Procedure: BIOPSY;  Surgeon: Cindie Carlin POUR, DO;  Location: [facility];  Service: Endoscopy;;  duodenum gastric esophagus   COLONOSCOPY WITH PROPOFOL  N/A 04/25/2020   internal hemorrhoids, one 5 mm polyp in descending colon. Tubular adenoma. 5 year surveillance.   CORONARY/GRAFT ACUTE MI REVASCULARIZATION N/A 05/21/2024   Procedure: Coronary/Graft Acute MI Revascularization;  Surgeon: Wonda Sharper, MD;  Location: [facility] INVASIVE CV LAB;  Service: Cardiovascular;  Laterality: N/A;   CYSTOSCOPY W/ URETERAL STENT PLACEMENT Bilateral 12/09/2013   Procedure: CYSTOSCOPY WITH RETROGRADE PYELOGRAM/URETERAL STENT PLACEMENT;  Surgeon: Ricardo Likens, MD;  Location: [facility];  Service: Urology;  Laterality: Bilateral;   CYSTOSCOPY WITH RETROGRADE PYELOGRAM, URETEROSCOPY AND STENT PLACEMENT Bilateral 09/30/2013   Procedure: CYSTOSCOPY WITH BILATERAL RETROGRADE PYELOGRAM, LEFT DIAGNOSTIC URETEROSCOPY AND Left ureteral stent;  Surgeon: Ricardo Likens, MD;  Location: [facility];  Service: Urology;  Laterality: Bilateral;   ESOPHAGOGASTRODUODENOSCOPY (EGD) WITH PROPOFOL  N/A 04/25/2020   Mildly severe candida esophagitis without bleed, s/p biopsy. Suspicion for eosinophilic esophagitis but no  increased eosinophils. Gastritis. Reactive gastropathy. Negative H.pylori.  +KOH prep.    LEFT HEART CATH AND CORONARY ANGIOGRAPHY N/A 05/21/2024   Procedure: LEFT HEART CATH AND CORONARY ANGIOGRAPHY;  Surgeon: Wonda Sharper, MD;  Location: [facility] INVASIVE CV LAB;  Service: Cardiovascular;  Laterality: N/A;   PERCUTANEOUS NEPHROLITHOTRIPSY  2005   POLYPECTOMY  04/25/2020   Procedure: POLYPECTOMY;  Surgeon: Cindie Carlin POUR, DO;  Location: [facility];  Service: Endoscopy;;  colon   ROBOT ASSISTED PYELOPLASTY N/A 12/09/2013   Procedure: ROBOTIC ASSISTED BILATERAL PYELOLITHOTOMY, RIGHT  PYELOPLASTY ;  Surgeon: Ricardo Likens, MD;  Location: [facility];  Service: Urology;  Laterality: N/A;    Indication for ECMO: Cardiogenic Shock  ECMO was deployed at 0444 and initiated at [phone]  Anticoagulation achieved with Heparin  bolus of 2000 units given to patient at [phone]. Cannulated for ECMO Mode: VA and achieved initial ECMO Flow (LPM): 5.12 and ECMO Sweep Gas (LPM): 2.    ECMO Cannula Information     Staff Present  Primary Perfusionist   Assisting Perfusionist/ECMO Specialist [place] Kartier Bennison RN Bri Scott RN  Cannulating Physician Rolan Fuel MD   ECMO Lot Numbers  CardioHelp Console  [id number]  Oxygenator  [id number]  Tubing Pack  [id number]  ECMO Goals  Flow goal    4.2- 5.0 LPM  Anticoagulation goal      Cardiac goal MAP > 65     Respiratory goal    O2 > 88%  Other goal       ECMO Handoff  Patient Information * Age Height Weight BSA IBW BMI  68 y.o.    (84.2 kg Body surface area is 2.08 meters  squared. No data recorded Body mass index is 24.49 kg/m.   Review History * Primary Diagnosis   Cardiogenic shock (HCC)  Prior Cardiac Arrest within 24hrs of ECMO initiation?   ECMO and MCS * Type ECMO Flow ECMO Sweep Gases   ECMO Device: Cardiohelp   Flow (LPM): 5.12   Sweep Gas (LPM): 2     Additional Mechanical Support   Ventilation *      N/A    Cannula Size and  Locations       Drainage 25Fr multistage RFV   Return 19 Fr LFA    *Cannula(e) sutured and anchored, secured and dressed.   Labs and Imaging *  *Cannulation position verified via imaging on arrival to ICU. Concerns communicated to attending surgeon. Labs reviewed.   All ECMO safety checks complete. ECMO flowsheet initiated, applicable charges captured, LDA's entered/confirmed, imaging and labs verified, blood products available, and report given to [employer] RN.      [1]  Allergies Allergen Reactions   Atorvastatin  Other (See Comments)    Myopathy/weakness   Invokana  [Canagliflozin ] Other (See Comments)    weakness   Semaglutide  Other (See Comments)    Heartburn   "

## 2024-05-23 NOTE — Progress Notes (Signed)
 Pharmacy Antibiotic Note  Philip Richardson is a 68 y.o. male admitted on 05/21/2024 with shock s/p VA ECMO cannulation. Pharmacy has been consulted for vancomycin  and meropenem  dosing.  Plan: Vancomycin  1750mg  IV x1 Reassess kidney function in am Meropenem  500g IV q12h  Weight: 84.2 kg (185 lb 9.6 oz)  Temp (24hrs), Avg:98.3 F (36.8 C), Min:97.7 F (36.5 C), Max:98.9 F (37.2 C)  Recent Labs  Lab 05/21/24 0840 05/21/24 0841 05/21/24 1226 05/21/24 1855 05/21/24 1858 05/22/24 0415 05/22/24 1015 05/22/24 1631 05/22/24 1910 05/22/24 2330 05/23/24 0134 05/23/24 0158 05/23/24 0352 05/23/24 0455 05/23/24 0514  WBC 8.8  --   --   --   --  17.5*  --   --   --   --   --   --   --  16.8*  --   CREATININE 2.17* 2.20*  --  2.32*  --  2.68*  --  2.80*  --   --   --   --   --  3.76*  --   LATICACIDVEN  --  0.6   < >  --    < >  --    < >  --    < > 3.0* 6.2* 7.0* 8.6*  --  11.9*   < > = values in this interval not displayed.    Estimated Creatinine Clearance: 21.3 mL/min (A) (by C-G formula based on SCr of 3.76 mg/dL (H)).    Allergies[1]    Ozell Jamaica, PharmD, BCPS, [facility] Clinical Pharmacist [phone] Please check AMION for all [facility] numbers 05/23/2024     [1]  Allergies Allergen Reactions   Atorvastatin  Other (See Comments)    Myopathy/weakness   Invokana  [Canagliflozin ] Other (See Comments)    weakness   Semaglutide  Other (See Comments)    Heartburn

## 2024-05-23 NOTE — Plan of Care (Signed)
  Problem: Clinical Measurements: Goal: Diagnostic test results will improve Outcome: Progressing Goal: Respiratory complications will improve Outcome: Progressing Goal: Cardiovascular complication will be avoided Outcome: Progressing   Problem: Coping: Goal: Level of anxiety will decrease Outcome: Progressing   Problem: Elimination: Goal: Will not experience complications related to urinary retention Outcome: Progressing   Problem: Pain Managment: Goal: General experience of comfort will improve and/or be controlled Outcome: Progressing

## 2024-05-23 NOTE — Progress Notes (Signed)
 2 units PRBC given via emergent release. First unit started in cath lab; second unit given on 2H. Units are as follows:  Unit number 1: T7600 [id number]. 05/23/24 [phone]. Unit number 2: T7600 25 [id number]. 05/23/24 [id number].  Total volume: 630 ml. Patient tolerated without issue.

## 2024-05-23 NOTE — Progress Notes (Signed)
 Orthopedic Tech Progress Note Patient Details:  Philip Richardson 20-Dec-1955 [id number] Applied knee immobilizer bilaterally per order.  Ortho Devices Type of Ortho Device: Knee Immobilizer Ortho Device/Splint Location: BLE Ortho Device/Splint Interventions: Ordered, Application, Adjustment   Post Interventions Patient Tolerated: Well Instructions Provided: Adjustment of device, Care of device, Poper ambulation with device  [name] 05/23/2024, 11:25 AM

## 2024-05-23 NOTE — Progress Notes (Signed)
 05/23/2024 Wretching, reglan  ordered and NGT to LIS Shock state worsening vasoplegic, methylene blue  x 2, calcium , bicarb, levo ceiling increased, giving additional volume to see if we can push up VA flows Broad spectrum abx started Bicarb gtt, stress steroids, continue insulin  gtt Remains tenuous for airway protection but sedation may kill him, will keep close eye on for now  Rolan Sharps MD PCCM

## 2024-05-23 NOTE — Progress Notes (Signed)
 Initial Nutrition Assessment  DOCUMENTATION CODES:   Not applicable  INTERVENTION:   Pending diet tolerance and ability to progress diet, recommend considering Cortrak placement with supplemental TF. Pt with elevated needs in setting of catabolic illness  Add MVI with Minerals  30 ml ProSource Plus TID, each supplement provides 100 kcals and 15 grams protein.     NUTRITION DIAGNOSIS:   Increased nutrient needs related to catabolic illness, acute illness as evidenced by estimated needs.  GOAL:   Patient will meet greater than or equal to 90% of their needs  MONITOR:   PO intake, Diet advancement, Supplement acceptance, Labs, I & O's, Weight trends  REASON FOR ASSESSMENT:   Consult, Ventilator Assessment of nutrition requirement/status (ECMO)  ASSESSMENT:   68 yo male admitted with anterior STEMI s/p DES x 3 and cardiogenic shock with acute systolic HF requiring VA ECMO with IABP vent,  AKI on CKD 3. PMH includes DM, HTN, HLD, CKD 3  12/18 Admitted 12/19 IABP 12/20 Worsening shock with increasing pressor requirements, VA ECMO cannulation  Pt remains on vent support, VA ECMO (fem-fem), IABP Epinephrine  at 1 Vasopressin  at 0.04 Methylene blue  x 2 yesterday Currently on insulin  gtt, bival gtt, amiodarone   Diet advanced today to CL from NPO. Currently on 10 L [place] NG placed yesterday but only 50 mL out  Nephrology following, holding off on CRRT at this time UOP 2L in 24 hours, Creatinine trending down  Current wt: 94.5 kg Admission wt: 84.2 kg  Unable to obtain diet and weight history from pt at this time  Hypoglycemia while NPO this AM  Labs: CBGs 56-82 Sodium 138 (wdl) Potassium 4.1 (wdl) BUN 48 Creatinine 3.22 Phosphorus 3.3 (wdl) Magnesium  2.1 (wdl)  Meds: Insulin  Drip Reglan    NUTRITION - FOCUSED PHYSICAL EXAM: Assess on follow-up  Diet Order:   Diet Order             Diet clear liquid Room service appropriate? Yes; Fluid consistency: Thin   Diet effective now                   EDUCATION NEEDS:   Not appropriate for education at this time  Skin:  Skin Assessment: Reviewed RN Assessment  Last BM:  12/21  Height:   Ht Readings from Last 1 Encounters:  05/14/24 6' 1 (1.854 m)    Weight:   Wt Readings from Last 1 Encounters:  05/24/24 94.5 kg    BMI:  Body mass index is 27.49 kg/m.  Estimated Nutritional Needs:   Kcal:  2100-2400 kcals  Protein:  130-160 g  Fluid:  1.8L   Betsey Finger MS, RDN, LDN, CNSC Registered Dietitian 3 Clinical Nutrition RD Inpatient Contact Info in Amion

## 2024-05-23 NOTE — Progress Notes (Signed)
 "    Advanced Heart Failure Rounding Note  Cardiologist: None   [facility] Cardiologist: Dr. Rolan  Chief Complaint: Late presenting anterior MI, acute CHF Patient Profile   Philip Richardson is a 68 y.o. male with history of poorly controlled DM II, HTN, HLD and CKD IIIb. Admitted with late presenting anterior STEMI and acute systolic CHF.  Significant events:   12/18: LHC 100% p LAD treated with 3 overlapping DES, residual 90% p diagonal post PCI. LVEDP 24 mmHg. 12/19 IABP placed for progressive shock 12/20 Cannulated for VA ECMO   Subjective:    Now on VA ECMO with IABP vent. Had severe SIRS response initially with VA ECMO but improved methylene blue  and adjustment of drips  Remains in AF. On IV amio.   Denies CP or SOB.   PAP: (-1-40)/(-15-36) 18/14 CVP:  [0 mmHg-32 mmHg] 12 mmHg PCWP:  [13 mmHg] 13 mmHg CO:  [3.1 L/min-4.6 L/min] 3.7 L/min CI:  [1.5 L/min/m2-2.22 L/min/m2] 1.8 L/min/m2   Objective:    90 kg Body mass index is 26.18 kg/m.   Vital Signs:   Temp:  [96.4 F (35.8 C)-98.9 F (37.2 C)] 97.5 F (36.4 C) (12/20 1900) Pulse Rate:  [0-170] 77 (12/20 1900) Resp:  [0-39] 10 (12/20 1900) BP: (79-132)/(56-105) 94/76 (12/20 0706) SpO2:  [89 %-100 %] 100 % (12/20 1900) Arterial Line BP: (51-157)/(21-88) 111/41 (12/20 1900) Weight:  [90 kg] 90 kg (12/20 1426) Last BM Date : 05/18/24  Weight change: Filed Weights   05/22/24 1035 05/23/24 1426  Weight: 84.2 kg 90 kg    Intake/Output:   Intake/Output Summary (Last 24 hours) at 05/23/2024 1911 Last data filed at 05/23/2024 1900 Gross per 24 hour  Intake 3718.27 ml  Output 1835 ml  Net 1883.27 ml     Physical Exam   General:  Lying in bed. No resp difficulty HEENT: normal Neck: RIJ swan  Cor: irreg tachy Lungs: clear Abdomen: soft, nontender, nondistended.Good bowel sounds. Extremities: no cyanosis, clubbing, rash, 2+  edema  + ecmo cannulas and IABP sites ok  Neuro: alert & orientedx3, cranial  nerves grossly intact. moves all 4 extremities w/o difficulty. Affect pleasant   Telemetry   AF 100-140 Personally reviewed  Labs   CBC Recent Labs    05/21/24 0840 05/21/24 0841 05/23/24 0809 05/23/24 0950 05/23/24 1512 05/23/24 1609  WBC 8.8   < > 15.6*  --   --  8.9  NEUTROABS 6.4  --   --   --   --   --   HGB 11.4*   < > 9.9*   < > 9.5* 9.9*  9.9*  HCT 34.1*   < > 29.7*   < > 28.0* 28.9*  29.0*  MCV 82.6   < > 84.6  --   --  81.6  PLT 203   < > 200  --   --  152   < > = values in this interval not displayed.   Basic Metabolic Panel Recent Labs    87/79/74 0455 05/23/24 0510 05/23/24 0809 05/23/24 0950 05/23/24 1512 05/23/24 1609  NA 127*   < > 131*   < > 133* 135  133*  K 4.3   < > 4.3   < > 3.5 3.7  3.5  CL 82*  --  81*  --   --  87*  CO2 14*  --  18*  --   --  31  GLUCOSE 480*  --  594*  --   --  439*  BUN 50*  --  48*  --   --  48*  CREATININE 3.76*  --  3.79*  --   --  3.58*  CALCIUM  7.9*  --  7.7*  --   --  8.5*  MG 2.2  --   --   --   --  2.1   < > = values in this interval not displayed.   Liver Function Tests Recent Labs    05/21/24 1855 05/23/24 0809  AST 254* 322*  ALT 58* 239*  ALKPHOS 125 89  BILITOT 0.8 1.0  PROT 6.5 4.3*  ALBUMIN  3.5 2.3*   No results for input(s): LIPASE, AMYLASE in the last 72 hours. Cardiac Enzymes No results for input(s): CKTOTAL, CKMB, CKMBINDEX, TROPONINI in the last 72 hours.  BNP: BNP (last 3 results) No results for input(s): BNP in the last 8760 hours.  ProBNP (last 3 results) Recent Labs    05/21/24 1855  PROBNP 18,891.0*     D-Dimer No results for input(s): DDIMER in the last 72 hours. Hemoglobin A1C Recent Labs    05/21/24 0840  HGBA1C 13.1*   Fasting Lipid Panel Recent Labs    05/21/24 0840  CHOL 154  HDL 35*  LDLCALC 97  TRIG 885  CHOLHDL 4.5   Medications:   Scheduled Medications:  sodium chloride    Intravenous Once   aspirin   81 mg Per Tube Daily    Chlorhexidine  Gluconate Cloth  6 each Topical Q0600   clopidogrel   75 mg Per Tube Daily   metoCLOPramide  (REGLAN ) injection  5 mg Intravenous Q8H   pantoprazole  (PROTONIX ) IV  40 mg Intravenous QHS   rosuvastatin   10 mg Per Tube Daily   sodium chloride  flush  10-40 mL Intracatheter Q12H   sodium chloride  flush  3 mL Intravenous Q12H   sodium chloride  flush  3 mL Intravenous Q12H   vancomycin  variable dose per unstable renal function (pharmacist dosing)   Does not apply See admin instructions    Infusions:  sodium chloride      sodium chloride      sodium chloride  Stopped (05/23/24 1025)   sodium chloride  10 mL/hr at 05/23/24 1800   albumin  human Stopped (05/23/24 1130)   amiodarone  60 mg/hr (05/23/24 1906)   bivalirudin  (ANGIOMAX ) 250 mg in sodium chloride  0.9 % 500 mL (0.5 mg/mL) infusion 0.01 mg/kg/hr (05/23/24 1900)   dexmedetomidine  (PRECEDEX ) IV infusion 0.1 mcg/kg/hr (05/23/24 1900)   dextrose  5% lactated ringers      epinephrine  5 mcg/min (05/23/24 1900)   insulin  20 Units/hr (05/23/24 1900)   lactated ringers      meropenem  (MERREM ) IV 500 mg (05/23/24 1103)   norepinephrine  (LEVOPHED ) Adult infusion 6 mcg/min (05/23/24 1900)   potassium chloride  50 mL/hr at 05/23/24 1900   promethazine  (PHENERGAN ) injection (IM or IVPB) Stopped (05/23/24 1014)   sodium bicarbonate  150 mEq in dextrose  5 % 1,150 mL infusion Stopped (05/23/24 1422)   vasopressin  0.04 Units/min (05/23/24 1900)    PRN Medications: sodium chloride , sodium chloride , acetaminophen , albumin  human, dextrose , guaiFENesin -dextromethorphan , ondansetron  (ZOFRAN ) IV, mouth rinse, phenol, sodium chloride  flush, sodium chloride  flush, sodium chloride  flush  Assessment/Plan   1. CAD: Late-presenting anterior STEMI, symptoms began on 12/14.  Cath was done showing occluded LAD that was treated with overlapping DES x 3.  There was residual 80% D1. Symptoms had a pleuritic character pre-cath, and he continues to have significant  pleuritic chest pain (not improved by PCI).  ECG post-cath showed persistent anterior STE.  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  No significant pericardial effusion present.  - Continue ASA 81 and ticagrelor .  - Continue Crestor  10 mg daily, has not been able to tolerate statins in the past so will start low dose.  - Continue colchicine  0.6 mg daily (renal dosing) given concern for post-infarct pericarditis, morphine  prn. Pain seems improved today 2. Acute systolic CHF: Ischemic cardiomyopathy. SCAI stage D - POCUS echo EF 25% - cMRI 30% + LV clot - Now on VA ECMO (12/20) with IABP vent. Wean epi, NE, VP as tolerated. Not candidate for Impella 5.5 with LV clot - Lactic acid trending down  - With poorly controlled DM2 and CKD likely not candidate for advanced therapies 3. AKI on CKD stage 3b: Suspect diabetic nephropathy. Creatinine 2.2>2.7 (baseline appears to be low 2s) - Suspect Scr worse in setting of contrast and possible low output - Can start CVVHD as needed. D/w Renal - on bival 4. AF with RVR - continue IV amio and bival 4. Type 2 DM: Poor control with HgbA1c 13.1.   - IV insulin  - SSI + lantus  at bedtime - Diabetes coordinator consult.  - Eventually restart Farxiga .  5. LV thrombus on cMRI   Length of Stay: 2  Toribio Fuel, MD  05/23/2024, 7:11 PM  Advanced Heart Failure Team Pager [phone] (M-F; 7a - 5p)   Please visit [email or web address]: For overnight coverage please call cardiology fellow first. If fellow not available call Shock/ECMO MD on call.  For ECMO / Mechanical Support (Impella, IABP, LVAD) issues call Shock / ECMO MD on call.      "

## 2024-05-23 NOTE — Progress Notes (Addendum)
 PHARMACY - ANTICOAGULATION CONSULT NOTE  Pharmacy Consult for heparin  Indication: LV thrombus and IABP  Labs: Recent Labs    05/21/24 0840 05/21/24 0841 05/21/24 1855 05/22/24 0415 05/22/24 1631 05/22/24 1835 05/22/24 2240 05/22/24 2252 05/22/24 2330  HGB 11.4*   < >  --  12.2*  --   --  10.5* 9.9*  9.9* 10.2*  HCT 34.1*   < >  --  36.2*  --   --  31.0* 29.0*  29.0* 30.0*  PLT 203  --   --  224  --   --   --   --   --   APTT >200*  --   --   --   --   --   --   --   --   LABPROT 18.2*  --   --   --   --   --   --   --   --   INR 1.4*  --   --   --   --   --   --   --   --   HEPARINUNFRC  --   --   --   --   --  0.47  --   --   --   CREATININE 2.17*   < > 2.32* 2.68* 2.80*  --   --   --   --    < > = values in this interval not displayed.   Assessment: 68yo male now back from cath lab with IABP in place, to resume heparin ; pt was at upper end of new lower goal at 1250 units/hr and rec'd 5000 units of heparin  during procedure; no signs of bleeding since return from lab per RN.  Goal of Therapy:  Heparin  level 0.3-0.5 units/ml   Plan:  Resume heparin  infusion at 1100 units/hr (was running at 1250 units/hr for one hour prior to rate change). Check level in 8 hours.   Marvetta Dauphin, PharmD, BCPS 05/23/2024 2:10 AM

## 2024-05-23 NOTE — Consult Note (Signed)
 "  NAME:  Philip Richardson, MRN:  [id number], DOB:  [birth year], LOS: 2 ADMISSION DATE:  05/21/2024, CONSULTATION DATE:  12/20 REFERRING MD:  Bensimhon, AHF CHIEF COMPLAINT:  cardiogenic shock    History of Present Illness:  68 year old male with past medical history of poorly controlled T2DM, GERD, HTN, HLD, CKD IIIb admitted 12/18 after presenting as code STEMI. Began having chest pain on Sunday (12/14) which resolved and then recurred.   Cath showed LAD occlusion tx with DES x 3 + IABP due to high LVEDP.  Persistent pain and cardiogenic shock post cath yesterday.  Overnight progressive shock so taken to cath lab.  IABP placed but no improvement so cannulated for VA ECMO.   PCCM consulted to assist with management.  Pertinent  Medical History  DM2 HTN HLD CKD  Significant Hospital Events: Including procedures, antibiotic start and stop dates in addition to other pertinent events   12/18-DES x 3 IABP 12/19 RHC: On NE 15, milrinone  0.375 and VP 0.02 Findings: Ao =81/58 (65) RA = 12 RV = 17/14 PA =  24/18 (20) PCW = 22 Fick cardiac output/index = 4.4/2.1 Thermo CO/CI =4.6/2.2 SVR = 770  Ao sat = 99% PA sat = 60%, 62% PAPi = 0.5 ECMO cannulation  Interim History / Subjective:  consult  Objective   Blood pressure 91/70, pulse 66, temperature 98.1 F (36.7 C), resp. rate 18, weight 84.2 kg, SpO2 92%. PAP: (-1-28)/(-15-24) 24/20 CVP:  [0 mmHg-30 mmHg] 12 mmHg CO:  [3.1 L/min-4.6 L/min] 4.2 L/min CI:  [1.5 L/min/m2-2.22 L/min/m2] 2 L/min/m2      Intake/Output Summary (Last 24 hours) at 05/23/2024 0529 Last data filed at 05/23/2024 0400 Gross per 24 hour  Intake 1479.31 ml  Output 1000 ml  Net 479.31 ml   Filed Weights   05/22/24 1035  Weight: 84.2 kg    Examination: Pale man laying in bed Ext lukewarm Nonlabored breathing Mildly confused ECMO cannulas being placed  Labs and imaging reviewed  Resolved Hospital Problem list   N/A  Assessment & Plan:  Late  presenting STEMI s/p DES x 3 + IABP 12/19 complicated by post MI progressive cardiogenic shock VA ECMO cannulation fem/fem 05/23/24 AKI on CKD3 Acute hypoxemic respiratory failure- currently 4LPM [place] ABLA expected post cannulation Shock state mixed Probable LV thrombus- precludes impella  - AC with heparin  for now - LV Vent w/ IABP - DAPT - Insulin  gtt - GDMT and diuresis per AHF - Levophed marcelino for MAP 65 - Transfuse as needed to maximize oxygen delivery (aka hgb 7-8) - Will follow  Labs   CBC: Recent Labs  Lab 05/21/24 0840 05/21/24 0841 05/22/24 0415 05/22/24 2240 05/22/24 2252 05/22/24 2330 05/23/24 0315  WBC 8.8  --  17.5*  --   --   --   --   NEUTROABS 6.4  --   --   --   --   --   --   HGB 11.4*   < > 12.2* 10.5* 9.9*  9.9* 10.2* 9.9*  HCT 34.1*   < > 36.2* 31.0* 29.0*  29.0* 30.0* 29.0*  MCV 82.6  --  84.4  --   --   --   --   PLT 203  --  224  --   --   --   --    < > = values in this interval not displayed.    Basic Metabolic Panel: Recent Labs  Lab 05/21/24 0840 05/21/24 0841 05/21/24 1855 05/22/24 0415 05/22/24 1631 05/22/24  2240 05/22/24 2252 05/22/24 2330 05/23/24 0315  NA 131* 131* 132* 129* 126* 128* 128*  128* 128* 128*  K 4.1 4.0 4.8 4.8 4.6 4.4 4.7  4.7 4.5 4.5  CL 96* 97* 95* 93* 89*  --   --   --   --   CO2 24  --  23 24 20*  --   --   --   --   GLUCOSE 187* 192* 116* 120* 225*  --   --   --   --   BUN 37* 33* 36* 40* 43*  --   --   --   --   CREATININE 2.17* 2.20* 2.32* 2.68* 2.80*  --   --   --   --   CALCIUM  9.0  --  9.4 9.2 8.8*  --   --   --   --   MG  --   --   --  1.8  --   --   --   --   --    GFR: Estimated Creatinine Clearance: 28.5 mL/min (A) (by C-G formula based on SCr of 2.8 mg/dL (H)). Recent Labs  Lab 05/21/24 0840 05/21/24 0841 05/22/24 0415 05/22/24 1015 05/22/24 2037 05/22/24 2330 05/23/24 0134 05/23/24 0158  WBC 8.8  --  17.5*  --   --   --   --   --   LATICACIDVEN  --    < >  --    < > 3.3* 3.0* 6.2*  7.0*   < > = values in this interval not displayed.    Liver Function Tests: Recent Labs  Lab 05/21/24 0840 05/21/24 1855  AST 142* 254*  ALT 52* 58*  ALKPHOS 121 125  BILITOT 0.5 0.8  PROT 6.0* 6.5  ALBUMIN  3.3* 3.5   No results for input(s): LIPASE, AMYLASE in the last 168 hours. No results for input(s): AMMONIA in the last 168 hours.  ABG    Component Value Date/Time   PHART 7.525 (H) 05/23/2024 0315   PCO2ART 23.7 (L) 05/23/2024 0315   PO2ART 81 (L) 05/23/2024 0315   HCO3 19.5 (L) 05/23/2024 0315   TCO2 20 (L) 05/23/2024 0315   ACIDBASEDEF 2.0 05/23/2024 0315   O2SAT 66.4 05/23/2024 0455     Coagulation Profile: Recent Labs  Lab 05/21/24 0840  INR 1.4*    Cardiac Enzymes: No results for input(s): CKTOTAL, CKMB, CKMBINDEX, TROPONINI in the last 168 hours.  HbA1C: HB A1C (BAYER DCA - WAIVED)  Date/Time Value Ref Range Status  04/07/2024 02:44 PM 10.9 (H) 4.8 - 5.6 % Final    Comment:             Prediabetes: 5.7 - 6.4          Diabetes: >6.4          Glycemic control for adults with diabetes: <7.0   11/11/2023 12:04 PM 9.9 (H) 4.8 - 5.6 % Final    Comment:             Prediabetes: 5.7 - 6.4          Diabetes: >6.4          Glycemic control for adults with diabetes: <7.0    Hgb A1c MFr Bld  Date/Time Value Ref Range Status  05/21/2024 08:40 AM 13.1 (H) 4.8 - 5.6 % Final    Comment:    (NOTE) Diagnosis of Diabetes The following HbA1c ranges recommended by the American Diabetes Association (ADA) may be used  as an aid in the diagnosis of diabetes mellitus.  Hemoglobin             Suggested A1C NGSP%              Diagnosis  <5.7                   Non Diabetic  5.7-6.4                Pre-Diabetic  >6.4                   Diabetic  <7.0                   Glycemic control for                       adults with diabetes.      CBG: Recent Labs  Lab 05/22/24 0630 05/22/24 1021 05/22/24 1119 05/22/24 1614 05/22/24 2113   GLUCAP 97 91 141* 181* 248*    Review of Systems:   As above   Past Medical History:  He,  has a past medical history of Frequency of urination, GERD (gastroesophageal reflux disease), Horseshoe kidney, Hypertension, Renal calculus, bilateral, Type 2 diabetes mellitus (HCC), Urgency of urination, and Wears dentures.   Surgical History:   Past Surgical History:  Procedure Laterality Date   BIOPSY  04/25/2020   Procedure: BIOPSY;  Surgeon: Cindie Carlin POUR, DO;  Location: [facility];  Service: Endoscopy;;  duodenum gastric esophagus   COLONOSCOPY WITH PROPOFOL  N/A 04/25/2020   internal hemorrhoids, one 5 mm polyp in descending colon. Tubular adenoma. 5 year surveillance.   CORONARY/GRAFT ACUTE MI REVASCULARIZATION N/A 05/21/2024   Procedure: Coronary/Graft Acute MI Revascularization;  Surgeon: Wonda Sharper, MD;  Location: [facility] INVASIVE CV LAB;  Service: Cardiovascular;  Laterality: N/A;   CYSTOSCOPY W/ URETERAL STENT PLACEMENT Bilateral 12/09/2013   Procedure: CYSTOSCOPY WITH RETROGRADE PYELOGRAM/URETERAL STENT PLACEMENT;  Surgeon: Ricardo Likens, MD;  Location: [facility];  Service: Urology;  Laterality: Bilateral;   CYSTOSCOPY WITH RETROGRADE PYELOGRAM, URETEROSCOPY AND STENT PLACEMENT Bilateral 09/30/2013   Procedure: CYSTOSCOPY WITH BILATERAL RETROGRADE PYELOGRAM, LEFT DIAGNOSTIC URETEROSCOPY AND Left ureteral stent;  Surgeon: Ricardo Likens, MD;  Location: [facility];  Service: Urology;  Laterality: Bilateral;   ESOPHAGOGASTRODUODENOSCOPY (EGD) WITH PROPOFOL  N/A 04/25/2020   Mildly severe candida esophagitis without bleed, s/p biopsy. Suspicion for eosinophilic esophagitis but no increased eosinophils. Gastritis. Reactive gastropathy. Negative H.pylori.  +KOH prep.    LEFT HEART CATH AND CORONARY ANGIOGRAPHY N/A 05/21/2024   Procedure: LEFT HEART CATH AND CORONARY ANGIOGRAPHY;  Surgeon: Wonda Sharper, MD;  Location: [facility] INVASIVE CV LAB;  Service: Cardiovascular;   Laterality: N/A;   PERCUTANEOUS NEPHROLITHOTRIPSY  2005   POLYPECTOMY  04/25/2020   Procedure: POLYPECTOMY;  Surgeon: Cindie Carlin POUR, DO;  Location: [facility];  Service: Endoscopy;;  colon   ROBOT ASSISTED PYELOPLASTY N/A 12/09/2013   Procedure: ROBOTIC ASSISTED BILATERAL PYELOLITHOTOMY, RIGHT  PYELOPLASTY ;  Surgeon: Ricardo Likens, MD;  Location: [facility];  Service: Urology;  Laterality: N/A;     Social History:   reports that he has never smoked. He has never used smokeless tobacco. He reports that he does not drink alcohol and does not use drugs.   Family History:  His family history includes Cancer in his mother. There is no history of Colon cancer or Pancreatitis.   Allergies Allergies[1]   Home Medications  Prior to Admission  medications  Medication Sig Start Date End Date Taking? Authorizing Provider  acetaminophen  (TYLENOL ) 325 MG tablet Take 325-650 mg by mouth every 6 (six) hours as needed (for pain.).   Yes [provider]  amLODipine  (NORVASC ) 10 MG tablet Take 1 tablet (10 mg total) by mouth daily. 10/14/23  Yes Gladis, Mary-Margaret, FNP  cloNIDine  (CATAPRES ) 0.1 MG tablet Take 1 tablet (0.1 mg total) by mouth 3 (three) times daily. 04/09/24  Yes Gladis, Mary-Margaret, FNP  FARXIGA  10 MG TABS tablet Take 1 tablet (10 mg total) by mouth daily before breakfast. 11/29/23  Yes Gladis, Mary-Margaret, FNP  FEROSUL 325 (65 Fe) MG tablet TAKE ONE TABLET DAILY WITH BREAKFAST 12/30/23  Yes Gladis, Mary-Margaret, FNP  glimepiride  (AMARYL ) 4 MG tablet Take 1 tablet (4 mg total) by mouth daily with breakfast. 04/16/24  Yes Therisa Benton PARAS, NP  insulin  degludec (TRESIBA  FLEXTOUCH) 200 UNIT/ML FlexTouch Pen Inject 50 Units into the skin daily. 04/16/24  Yes Therisa Benton PARAS, NP  losartan (COZAAR) 25 MG tablet Take 25 mg by mouth daily.   Yes [provider]  Multiple Vitamins-Minerals (CENTRUM SILVER MEN 50+ PO) Take by mouth daily.   Yes [provider]   OVER THE COUNTER MEDICATION Take by mouth daily. Nature's Bounty - Magnesium  500 mg   Yes [provider]  pantoprazole  (PROTONIX ) 40 MG tablet Take 1 tablet (40 mg total) by mouth 2 (two) times daily. 10/14/23  Yes Gladis, Mary-Margaret, FNP  amoxicillin -clavulanate (AUGMENTIN ) 875-125 MG tablet Take 1 tablet by mouth 2 (two) times daily. Patient not taking: Reported on 05/21/2024 05/14/24   Gladis Mustard, FNP  Continuous Glucose Sensor (FREESTYLE LIBRE 2 SENSOR) MISC CHECK BLOOD SUGAR AS DIRECTED 04/02/24   Gladis, Mary-Margaret, FNP  glucose blood (ONETOUCH VERIO) test strip Use to test blood sugar twice daily. DX E11.9 03/14/20   Gladis Mustard, FNP  Insulin  Pen Needle (PEN NEEDLES 31GX5/16) 31G X 8 MM MISC Use to inject insulin  daily as prescribed 04/16/24   Therisa Benton PARAS, NP  OneTouch Delica Lancets 30G MISC Use to test blood sugar twice daily. DX E11.9 03/14/20   Gladis, Mary-Margaret, FNP  OVER THE COUNTER MEDICATION Take by mouth in the morning and at bedtime. [facility] Off    [provider]     Critical care time: 34 min cc time independent of procedures            [1]  Allergies Allergen Reactions   Atorvastatin  Other (See Comments)    Myopathy/weakness   Invokana  [Canagliflozin ] Other (See Comments)    weakness   Semaglutide  Other (See Comments)    Heartburn   "

## 2024-05-23 NOTE — Consult Note (Signed)
 Nephrology Consult   Service requesting consult: CCM Reason for consult: AKI, CRRT  Assessment/Recommendations:   AKI on CKD3b -followed by Dr.Lateef OP. AKI likely secondary to ATN in the context of shock and contrast-induced injury. Expecting worsening renal function. Cr yet to peak, recent 3.76. Nonoliguric -currently nonoliguric, pH is good. Will hold off on starting CRRT via ECMO circuit at this current moment but would have a low threshold. For completion's sake, will check UA and renal ultrasound Avoid nephrotoxic medications including NSAIDs and iodinated intravenous contrast exposure unless the latter is absolutely indicated.  Preferred narcotic agents for pain control are hydromorphone , fentanyl , and methadone. Morphine  should not be used. Avoid Baclofen and avoid oral sodium phosphate and magnesium  citrate based laxatives / bowel preps. Continue strict Input and Output monitoring. Will monitor the patient closely with you and intervene or adjust therapy as indicated by changes in clinical status/labs   STEMI Cardiogenic shock -s/p PCI -with IABP and VA ECMO -pressor support per CCM and AHF  AHRF -on HFNC  AGMA -secondary to AKI and lactic acidosis. Most recent pH 7.5  Hypervolemic Hyponatremia -improving, Na up to 131  Anemia/ABLA -transfuse PRN for Hgb <7.Hgb 7.8, post op bleed likely  Hypocalcemia -replete PRN  Discussed with AHF, CCM, and ICU RN.  The patient is critically ill with shock, STEMI, acidosis, AHRF and which includes my role to primarily manage AKI.  This requires high complexity decision making.  Total critical care time: 27 min    Critical care time was exclusive of treating other patients.   Critical care was necessary to treat or prevent imminent or life-threatening deterioration.   Critical care was time spent personally by me on the following activities:   development of treatment plan with patient and/or surrogate as well as nursing,    discussions with other provider evaluation of patient's response to treatment  examination of patient  obtaining history from patient or surrogate  ordering and performing treatments and interventions  ordering and review of laboratory studies  ordering and review of radiographic studies  Ephriam Stank, MD [facility]  _____________________________________________________________________________________   History of Present Illness: Philip Richardson is a/an 68 y.o. male with a past medical history of CKD 3B, uncontrolled DM 2, horseshoe kidney, hypertension, GERD who presents to [facility] with chest pain.  Found to have STEMI (late presentation).  Status post PCI with DES x3 with IABP on 12/19.  Complicated by progressive shock.  Underwent cannulation for VA ECMO on 12/20.  Has had worsening pressor requirements and worsening lactic acidosis. Patient seen in room, no complaints currently (suprisingly). UOP 1L. Per nursing, is still putting out urine. Pressors: dobutamine , NE, vaso,epi Followed by OP by Dr. Marcelino ([facility]) for CKD3b care. Personally reviewed his MRI abd with and without contrast images from 2021-confirmed horseshoe kidney.   Medications:  Current Facility-Administered Medications  Medication Dose Route Frequency Provider Last Rate Last Admin   0.9 %  sodium chloride  infusion (Manually program via Guardrails IV Fluids)   Intravenous Continuous Bensimhon, Toribio SAUNDERS, MD       0.9 %  sodium chloride  infusion (Manually program via Guardrails IV Fluids)   Intravenous PRN Bensimhon, Toribio SAUNDERS, MD       0.9 %  sodium chloride  infusion (Manually program via Guardrails IV Fluids)   Intravenous Once Bensimhon, Toribio SAUNDERS, MD       0.9 %  sodium chloride  infusion  250 mL Intravenous PRN Bensimhon, Toribio SAUNDERS, MD  0.9 %  sodium chloride  infusion   Intravenous Continuous Bensimhon, Daniel R, MD 10 mL/hr at 05/23/24 0425 Bolus from Bag at 05/23/24 0425   0.9 %  sodium chloride   infusion   Intravenous Continuous Bensimhon, Toribio SAUNDERS, MD 10 mL/hr at 05/23/24 0400 Infusion Verify at 05/23/24 0400   acetaminophen  (TYLENOL ) tablet 650 mg  650 mg Oral Q4H PRN Bensimhon, Toribio SAUNDERS, MD       amiodarone  (NEXTERONE  PREMIX) 360-4.14 MG/200ML-% (1.8 mg/mL) IV infusion  60 mg/hr Intravenous Continuous Bensimhon, Toribio SAUNDERS, MD 33.3 mL/hr at 05/23/24 0400 60 mg/hr at 05/23/24 0400   aspirin  EC tablet 81 mg  81 mg Oral Daily Bensimhon, Daniel R, MD   81 mg at 05/22/24 9051   benzonatate  (TESSALON ) capsule 200 mg  200 mg Oral BID Bensimhon, Daniel R, MD   200 mg at 05/22/24 9052   calcium  gluconate 2 g/ 100 mL sodium chloride  IVPB  2 g Intravenous Once Bensimhon, Daniel R, MD       calcium  gluconate 2-0.675 GM/100ML-% IVPB            Chlorhexidine  Gluconate Cloth 2 % PADS 6 each  6 each Topical Q0600 Bensimhon, Daniel R, MD   6 each at 05/22/24 9480   clopidogrel  (PLAVIX ) tablet 75 mg  75 mg Oral Daily Bensimhon, Daniel R, MD       colchicine  tablet 0.6 mg  0.6 mg Oral Daily Bensimhon, Toribio SAUNDERS, MD   0.6 mg at 05/22/24 9051   dextrose  5 % in lactated ringers  infusion   Intravenous Continuous Claudene Toribio BROCKS, MD       dextrose  50 % solution 0-50 mL  0-50 mL Intravenous PRN Claudene Toribio BROCKS, MD       DOBUTamine  (DOBUTREX ) infusion 4000 mcg/mL  2.5 mcg/kg/min Intravenous Continuous Bensimhon, Toribio SAUNDERS, MD 3.16 mL/hr at 05/23/24 0748 2.5 mcg/kg/min at 05/23/24 9251   EPINEPHrine  (ADRENALIN ) 10 mg in sodium chloride  0.9 % 250 mL (0.04 mg/mL) infusion  0.5-20 mcg/min Intravenous Titrated Cyndy Ozell DASEN, RPH       guaiFENesin -dextromethorphan  (ROBITUSSIN DM) 100-10 MG/5ML syrup 5 mL  5 mL Oral Q4H PRN Bensimhon, Toribio SAUNDERS, MD   5 mL at 05/22/24 1815   hydrALAZINE  (APRESOLINE ) injection 10 mg  10 mg Intravenous Q20 Min PRN Bensimhon, Toribio SAUNDERS, MD       insulin  regular, human (MYXREDLIN ) 100 units/ 100 mL infusion   Intravenous Continuous Claudene Toribio BROCKS, MD       lactated ringers  bolus 250 mL  250  mL Intravenous Once Nasir, Moiz A, MD       lactated ringers  infusion   Intravenous Continuous Claudene Toribio BROCKS, MD       meropenem  (MERREM ) 500 mg in sodium chloride  0.9 % 100 mL IVPB  500 mg Intravenous Q12H Cyndy Ozell DASEN, RPH       nitroGLYCERIN  (NITROSTAT ) SL tablet 0.4 mg  0.4 mg Sublingual Q5 Min x 3 PRN Bensimhon, Daniel R, MD   0.4 mg at 05/22/24 0548   norepinephrine  (LEVOPHED ) 16 mg in (0.064 mg/mL) premix infusion  0-40 mcg/min Intravenous Titrated Bitonti, Michael T, RPH 37.5 mL/hr at 05/23/24 0747 40 mcg/min at 05/23/24 0747   ondansetron  (ZOFRAN ) injection 4 mg  4 mg Intravenous Q6H PRN Bensimhon, Daniel R, MD       Oral care mouth rinse  15 mL Mouth Rinse PRN Bensimhon, Toribio SAUNDERS, MD       oxyCODONE  (Oxy IR/ROXICODONE ) immediate release tablet 5-10  mg  5-10 mg Oral Q4H PRN Bensimhon, Daniel R, MD   10 mg at 05/23/24 0041   pantoprazole  (PROTONIX ) injection 40 mg  40 mg Intravenous QHS Smith, Daniel C, MD       phenol (CHLORASEPTIC) mouth spray 1 spray  1 spray Mouth/Throat PRN Bensimhon, Toribio SAUNDERS, MD       phenylephrine  80 mcg/10 mL injection            rosuvastatin  (CRESTOR ) tablet 10 mg  10 mg Oral Daily Bensimhon, Daniel R, MD   10 mg at 05/22/24 9051   sodium bicarbonate  injection 100 mEq  100 mEq Intravenous Once Claudene Toribio BROCKS, MD       sodium chloride  flush (NS) 0.9 % injection 10-40 mL  10-40 mL Intracatheter Q12H Bensimhon, Toribio SAUNDERS, MD   10 mL at 05/22/24 1000   sodium chloride  flush (NS) 0.9 % injection 10-40 mL  10-40 mL Intracatheter PRN Bensimhon, Toribio SAUNDERS, MD       sodium chloride  flush (NS) 0.9 % injection 3 mL  3 mL Intravenous Q12H Bensimhon, Toribio SAUNDERS, MD   3 mL at 05/22/24 1101   sodium chloride  flush (NS) 0.9 % injection 3 mL  3 mL Intravenous PRN Bensimhon, Toribio SAUNDERS, MD       sodium chloride  flush (NS) 0.9 % injection 3 mL  3 mL Intravenous Q12H Bensimhon, Toribio SAUNDERS, MD       sodium chloride  flush (NS) 0.9 % injection 3 mL  3 mL Intravenous PRN  Bensimhon, Toribio SAUNDERS, MD       vancomycin  (VANCOCIN ) 1-5 GM/200ML-% IVPB            vancomycin  (VANCOREADY) IVPB 1750 mg/350 mL  1,750 mg Intravenous Once Bensimhon, Toribio SAUNDERS, MD       vancomycin  variable dose per unstable renal function (pharmacist dosing)   Does not apply See admin instructions Cyndy Ozell DASEN [facility]       vasopressin  (PITRESSIN) 20 Units in 100 mL (0.2 unit/mL) infusion-*FOR SHOCK*  0-0.04 Units/min Intravenous Continuous Bensimhon, Toribio SAUNDERS, MD 12 mL/hr at 05/23/24 0400 0.04 Units/min at 05/23/24 0400     ALLERGIES Atorvastatin , Invokana  [canagliflozin ], and Semaglutide   MEDICAL HISTORY Past Medical History:  Diagnosis Date   Frequency of urination    GERD (gastroesophageal reflux disease)    Horseshoe kidney    BILATERAL   Hypertension    Renal calculus, bilateral    Type 2 diabetes mellitus (HCC)    Urgency of urination    Wears dentures      SOCIAL HISTORY Social History   Socioeconomic History   Marital status: Single    Spouse name: Not on file   Number of children: Not on file   Years of education: Not on file   Highest education level: Not on file  Occupational History   Not on file  Tobacco Use   Smoking status: Never   Smokeless tobacco: Never  Vaping Use   Vaping status: Never Used  Substance and Sexual Activity   Alcohol use: No    Comment: no history of etoh use   Drug use: No   Sexual activity: Not on file  Other Topics Concern   Not on file  Social History Narrative   Not on file   Social Drivers of Health   Tobacco Use: Low Risk (05/21/2024)   Patient History    Smoking Tobacco Use: Never    Smokeless Tobacco Use: Never    Passive Exposure: Not on  file  Financial Resource Strain: Low Risk (01/31/2024)   Overall Financial Resource Strain (CARDIA)    Difficulty of Paying Living Expenses: Not hard at all  Food Insecurity: No Food Insecurity (05/21/2024)   Epic    Worried About Programme Researcher, Broadcasting/film/video in the Last Year: Never  true    Ran Out of Food in the Last Year: Never true  Transportation Needs: No Transportation Needs (05/21/2024)   Epic    Lack of Transportation (Medical): No    Lack of Transportation (Non-Medical): No  Physical Activity: Sufficiently Active (01/31/2024)   Exercise Vital Sign    Days of Exercise per Week: 7 days    Minutes of Exercise per Session: 30 min  Stress: No Stress Concern Present (01/31/2024)   [employer] of Occupational Health - Occupational Stress Questionnaire    Feeling of Stress: Not at all  Social Connections: Moderately Integrated (05/21/2024)   Social Connection and Isolation Panel    Frequency of Communication with Friends and Family: More than three times a week    Frequency of Social Gatherings with Friends and Family: More than three times a week    Attends Religious Services: More than 4 times per year    Active Member of Clubs or Organizations: No    Attends Banker Meetings: Never    Marital Status: Married  Catering Manager Violence: Not At Risk (05/21/2024)   Epic    Fear of Current or Ex-Partner: No    Emotionally Abused: No    Physically Abused: No    Sexually Abused: No  Depression (PHQ2-9): Low Risk (05/14/2024)   Depression (PHQ2-9)    PHQ-2 Score: 0  Alcohol Screen: Low Risk (01/31/2024)   Alcohol Screen    Last Alcohol Screening Score (AUDIT): 0  Housing: Low Risk (05/21/2024)   Epic    Unable to Pay for Housing in the Last Year: No    Number of Times Moved in the Last Year: 0    Homeless in the Last Year: No  Utilities: Not At Risk (05/21/2024)   Epic    Threatened with loss of utilities: No  Health Literacy: Adequate Health Literacy (01/31/2024)   B1300 Health Literacy    Frequency of need for help with medical instructions: Never     FAMILY HISTORY Family History  Problem Relation Age of Onset   Cancer Mother    Colon cancer Neg Hx    Pancreatitis Neg Hx      Review of Systems: 12 systems reviewed Otherwise  as per HPI, all other systems reviewed and negative  Physical Exam: Vitals:   05/23/24 0721 05/23/24 0726  BP:    Pulse: (!) 0 (!) 0  Resp:    Temp:    SpO2:     No intake/output data recorded.  Intake/Output Summary (Last 24 hours) at 05/23/2024 0749 Last data filed at 05/23/2024 0400 Gross per 24 hour  Intake 1479.31 ml  Output 1000 ml  Net 479.31 ml   General: ill -appearing, no acute distress, laying flat HEENT: anicteric sclera, oropharynx clear without lesions CV: regular rate, normal rhythm, no murmurs, no gallops, no rubs, trace edema bilateral LEs Lungs: decreased breath sounds bibasilar, unlabored/normal wob Abd: soft, non-tender, non-distended Skin: no visible lesions or rashes Musculoskeletal: no obvious deformities Neuro: awake, alert but drowsy  Test Results Reviewed Lab Results  Component Value Date   NA 131 (L) 05/23/2024   K 3.8 05/23/2024   CL 82 (L) 05/23/2024   CO2  14 (L) 05/23/2024   BUN 50 (H) 05/23/2024   CREATININE 3.76 (H) 05/23/2024   CALCIUM  7.9 (L) 05/23/2024   ALBUMIN  3.5 05/21/2024   PHOS 3.3 07/16/2019     I have reviewed all relevant outside healthcare records related to the patient's kidney injury.

## 2024-05-23 NOTE — Progress Notes (Addendum)
 ANTICOAGULATION CONSULT NOTE  Pharmacy Consult for bivalirudin  Indication: LV thrombus  Allergies[1]  Patient Measurements: Weight: 84.2 kg (185 lb 9.6 oz) Heparin  Dosing Weight: 84 kg    Vital Signs: Temp: 98.1 F (36.7 C) (12/20 0415) Temp Source: Oral (12/19 1945) BP: 94/76 (12/20 0706) Pulse Rate: 0 (12/20 0726)  Labs: Recent Labs    05/21/24 0840 05/21/24 0841 05/22/24 0415 05/22/24 1631 05/22/24 1835 05/22/24 2240 05/23/24 0455 05/23/24 0510 05/23/24 0630 05/23/24 0643  HGB 11.4*   < > 12.2*  --   --    < > 10.2* 10.2* 10.2* 7.8*  HCT 34.1*   < > 36.2*  --   --    < > 30.0* 30.0* 30.0* 23.0*  PLT 203  --  224  --   --   --  247  --   --   --   APTT >200*  --   --   --   --   --   --   --   --   --   LABPROT 18.2*  --   --   --   --   --   --   --   --   --   INR 1.4*  --   --   --   --   --   --   --   --   --   HEPARINUNFRC  --   --   --   --  0.47  --   --   --   --   --   CREATININE 2.17*   < > 2.68* 2.80*  --   --  3.76*  --   --   --    < > = values in this interval not displayed.    Estimated Creatinine Clearance: 21.3 mL/min (A) (by C-G formula based on SCr of 3.76 mg/dL (H)).  Medical History: Past Medical History:  Diagnosis Date   Frequency of urination    GERD (gastroesophageal reflux disease)    Horseshoe kidney    BILATERAL   Hypertension    Renal calculus, bilateral    Type 2 diabetes mellitus (HCC)    Urgency of urination    Wears dentures      Assessment: 89 yoM presents as late presenting STEMI s/p overlapping DES x3 to LAD with ischemic CMP (EF 40% TTE, 30% cMRI) with LV thrombus on cMRI.  Not on anticoagulation prior to admission.  Pharmacy consulted for heparin  dosing. Pt s/p IABP palcement and then VA-ECMO cannulation on 12/20. Pharmacy to transition to bivalirudin .   Goal of Therapy:  aPTT 50-70 seconds Monitor platelets by anticoagulation protocol: Yes   Plan:  Bivalirudin  0.025mg /kg/hr - dosing wt 84.2kg Check aPTT in  6h  ADDENDUM 1130: PTT >200 seconds prior to bivalirudin  initiation, will hold off and recheck.  ADDENDUM 1400: repeat aPTT coming down to 82 seconds but still above goal range for DTI, will check aPTT at 5p and if continues normalizing will start bivalirudin .   Ozell Jamaica, PharmD, BCPS, [facility] Clinical Pharmacist [phone] Please check AMION for all [facility] Pharmacy numbers 05/23/2024        [1]  Allergies Allergen Reactions   Atorvastatin  Other (See Comments)    Myopathy/weakness   Invokana  [Canagliflozin ] Other (See Comments)    weakness   Semaglutide  Other (See Comments)    Heartburn

## 2024-05-23 NOTE — Progress Notes (Signed)
 Pharmacy Antibiotic Note  Philip Richardson is a 68 y.o. male admitted on 05/21/2024 with shock s/p VA ECMO cannulation. Pharmacy has been consulted for vancomycin  and meropenem  dosing.  Plan: Vancomycin  1750mg  IV x1 Reassess kidney function in am Meropenem  500g IV q12h  Weight: 84.2 kg (185 lb 9.6 oz)  Temp (24hrs), Avg:98.2 F (36.8 C), Min:96.4 F (35.8 C), Max:98.9 F (37.2 C)  Recent Labs  Lab 05/21/24 0840 05/21/24 0841 05/21/24 1226 05/21/24 1855 05/21/24 1858 05/22/24 0415 05/22/24 1015 05/22/24 1631 05/22/24 1910 05/23/24 0134 05/23/24 0158 05/23/24 0352 05/23/24 0455 05/23/24 0514 05/23/24 0809 05/23/24 0817  WBC 8.8  --   --   --   --  17.5*  --   --   --   --   --   --  16.8*  --  15.6*  --   CREATININE 2.17* 2.20*  --  2.32*  --  2.68*  --  2.80*  --   --   --   --  3.76*  --   --   --   LATICACIDVEN  --  0.6   < >  --    < >  --    < >  --    < > 6.2* 7.0* 8.6*  --  11.9*  --  14.3*   < > = values in this interval not displayed.    Estimated Creatinine Clearance: 21.3 mL/min (A) (by C-G formula based on SCr of 3.76 mg/dL (H)).    Allergies[1]    Ozell Jamaica, PharmD, BCPS, [facility] Clinical Pharmacist [phone] Please check AMION for all [facility] Pharmacy numbers 05/23/2024      [1]  Allergies Allergen Reactions   Atorvastatin  Other (See Comments)    Myopathy/weakness   Invokana  [Canagliflozin ] Other (See Comments)    weakness   Semaglutide  Other (See Comments)    Heartburn

## 2024-05-23 NOTE — Progress Notes (Signed)
 ANTICOAGULATION CONSULT NOTE  Pharmacy Consult for bivalirudin  Indication: LV thrombus /VA  ECMO + IABP  Allergies[1]  Patient Measurements: Weight: 90 kg (198 lb 6.6 oz) Heparin  Dosing Weight: 84 kg    Vital Signs: Temp: 97.7 F (36.5 C) (12/20 1730) Temp Source: Core (12/20 1600) BP: 94/76 (12/20 0706) Pulse Rate: 68 (12/20 0900)  Labs: Recent Labs    05/21/24 0840 05/21/24 0841 05/22/24 1835 05/22/24 2240 05/23/24 0455 05/23/24 0510 05/23/24 0806 05/23/24 0809 05/23/24 0950 05/23/24 1055 05/23/24 1155 05/23/24 1413 05/23/24 1512 05/23/24 1609 05/23/24 1714  HGB 11.4*   < >  --    < > 10.2*   < >  --  9.9*   < >  --    < > 9.5* 9.5* 9.9*  9.9*  --   HCT 34.1*   < >  --    < > 30.0*   < >  --  29.7*   < >  --    < > 28.0* 28.0* 28.9*  29.0*  --   PLT 203   < >  --   --  247  --   --  200  --   --   --   --   --  152  --   APTT >200*  --   --   --   --   --   --  >200*  --  82*  --   --   --   --  52*  LABPROT 18.2*  --   --   --   --   --   --  26.6*  --   --   --   --   --   --   --   INR 1.4*  --   --   --   --   --   --  2.3*  --   --   --   --   --   --   --   HEPARINUNFRC  --   --  0.47  --   --   --  0.72*  --   --   --   --   --   --   --   --   CREATININE 2.17*   < >  --   --  3.76*  --   --  3.79*  --   --   --   --   --  3.58*  --    < > = values in this interval not displayed.    Estimated Creatinine Clearance: 22.3 mL/min (A) (by C-G formula based on SCr of 3.58 mg/dL (H)).  Medical History: Past Medical History:  Diagnosis Date   Frequency of urination    GERD (gastroesophageal reflux disease)    Horseshoe kidney    BILATERAL   Hypertension    Renal calculus, bilateral    Type 2 diabetes mellitus (HCC)    Urgency of urination    Wears dentures      Assessment: 88 yoM presents as late presenting STEMI s/p overlapping DES x3 to LAD with ischemic CMP (EF 40% TTE, 30% cMRI) with LV thrombus on cMRI.  Not on anticoagulation prior to  admission.  Pharmacy consulted for heparin  dosing. Pt s/p IABP palcement and then VA-ECMO cannulation on 12/20. Pharmacy to transition to bivalirudin . Post cannulation - heparin  took time to clear - aptt finally down to 50sec (bottom end of therapeutic range) No bleeding noted Will  start low dose bivalirudin  drip and slowly titrate to keep aptt in goal range  Goal of Therapy:  aPTT 50-70 seconds Monitor platelets by anticoagulation protocol: Yes   Plan:  Bivalirudin  0.01mg /kg/hr - dosing wt 84.2kg Check aPTT in 6h Q12 aptt and CBC  Monitor s/s bleeding     Olam Chalk Pharm.D. CPP, BCPS Clinical Pharmacist [phone] 05/23/2024 6:15 PM   Please check AMION for all [facility] Pharmacy numbers 05/23/2024         [1]  Allergies Allergen Reactions   Atorvastatin  Other (See Comments)    Myopathy/weakness   Invokana  [Canagliflozin ] Other (See Comments)    weakness   Semaglutide  Other (See Comments)    Heartburn

## 2024-05-23 NOTE — Progress Notes (Signed)
 PT Cancellation Note  Patient Details Name: Philip Richardson MRN: [id number] DOB: 10-Sep-1955   Cancelled Treatment:    Reason Eval/Treat Not Completed: Medical issues which prohibited therapy. Pt cannulated for ECMO overnight. PT will follow up when pt is medically stable and able to participate in mobility assessment.   Bernardino JINNY Ruth 05/23/2024, 3:43 PM

## 2024-05-23 NOTE — Progress Notes (Signed)
" ° °  Patient taken to cath lab earlier for [facility] and IABP +/- ECMO cannulation.   Hemodynamics at that time better then I expected with CI > 2.0 so IABP place and ECMO deferred.   Over the past several hours patient has become more hypotensive and acidotic despite IABP and high-dose pressor support.  Now on NE 40 epi 5, VP 0.03 and DBA 5 with MAPs 50-60s and lactate 11.  Discussed situation with him and his significant other. Wants to proceed with VA ECMO support.   ECMO and cath teams called in. D/w CCM at bedside.   Total additional critical care time throughout he course of the early am = 90 mins.   Toribio Fuel, MD  5:40 AM  "

## 2024-05-24 ENCOUNTER — Encounter (HOSPITAL_COMMUNITY): Payer: Self-pay | Admitting: Internal Medicine

## 2024-05-24 ENCOUNTER — Inpatient Hospital Stay (HOSPITAL_COMMUNITY)

## 2024-05-24 DIAGNOSIS — I213 ST elevation (STEMI) myocardial infarction of unspecified site: Secondary | ICD-10-CM | POA: Diagnosis not present

## 2024-05-24 DIAGNOSIS — I429 Cardiomyopathy, unspecified: Secondary | ICD-10-CM | POA: Diagnosis not present

## 2024-05-24 DIAGNOSIS — I2102 ST elevation (STEMI) myocardial infarction involving left anterior descending coronary artery: Secondary | ICD-10-CM | POA: Diagnosis not present

## 2024-05-24 DIAGNOSIS — R570 Cardiogenic shock: Secondary | ICD-10-CM | POA: Diagnosis not present

## 2024-05-24 DIAGNOSIS — J9601 Acute respiratory failure with hypoxia: Secondary | ICD-10-CM | POA: Diagnosis not present

## 2024-05-24 DIAGNOSIS — I5082 Biventricular heart failure: Secondary | ICD-10-CM | POA: Diagnosis not present

## 2024-05-24 DIAGNOSIS — Z515 Encounter for palliative care: Secondary | ICD-10-CM

## 2024-05-24 DIAGNOSIS — N183 Chronic kidney disease, stage 3 unspecified: Secondary | ICD-10-CM | POA: Diagnosis not present

## 2024-05-24 DIAGNOSIS — N179 Acute kidney failure, unspecified: Secondary | ICD-10-CM | POA: Diagnosis not present

## 2024-05-24 DIAGNOSIS — Z7189 Other specified counseling: Secondary | ICD-10-CM | POA: Diagnosis not present

## 2024-05-24 LAB — HEPATIC FUNCTION PANEL
ALT: 534 U/L — ABNORMAL HIGH (ref 0–44)
AST: 756 U/L — ABNORMAL HIGH (ref 15–41)
Albumin: 2.7 g/dL — ABNORMAL LOW (ref 3.5–5.0)
Alkaline Phosphatase: 89 U/L (ref 38–126)
Bilirubin, Direct: 0.6 mg/dL — ABNORMAL HIGH (ref 0.0–0.2)
Indirect Bilirubin: 0.3 mg/dL (ref 0.3–0.9)
Total Bilirubin: 0.9 mg/dL (ref 0.0–1.2)
Total Protein: 4.8 g/dL — ABNORMAL LOW (ref 6.5–8.1)

## 2024-05-24 LAB — POCT I-STAT 7, (LYTES, BLD GAS, ICA,H+H)
Acid-Base Excess: 0 mmol/L (ref 0.0–2.0)
Acid-Base Excess: 10 mmol/L — ABNORMAL HIGH (ref 0.0–2.0)
Acid-Base Excess: 10 mmol/L — ABNORMAL HIGH (ref 0.0–2.0)
Acid-Base Excess: 11 mmol/L — ABNORMAL HIGH (ref 0.0–2.0)
Acid-Base Excess: 12 mmol/L — ABNORMAL HIGH (ref 0.0–2.0)
Acid-Base Excess: 12 mmol/L — ABNORMAL HIGH (ref 0.0–2.0)
Acid-Base Excess: 12 mmol/L — ABNORMAL HIGH (ref 0.0–2.0)
Acid-Base Excess: 15 mmol/L — ABNORMAL HIGH (ref 0.0–2.0)
Acid-Base Excess: 4 mmol/L — ABNORMAL HIGH (ref 0.0–2.0)
Acid-Base Excess: 8 mmol/L — ABNORMAL HIGH (ref 0.0–2.0)
Acid-Base Excess: 9 mmol/L — ABNORMAL HIGH (ref 0.0–2.0)
Bicarbonate: 23.6 mmol/L (ref 20.0–28.0)
Bicarbonate: 26.4 mmol/L (ref 20.0–28.0)
Bicarbonate: 33 mmol/L — ABNORMAL HIGH (ref 20.0–28.0)
Bicarbonate: 33.2 mmol/L — ABNORMAL HIGH (ref 20.0–28.0)
Bicarbonate: 34 mmol/L — ABNORMAL HIGH (ref 20.0–28.0)
Bicarbonate: 34.1 mmol/L — ABNORMAL HIGH (ref 20.0–28.0)
Bicarbonate: 34.9 mmol/L — ABNORMAL HIGH (ref 20.0–28.0)
Bicarbonate: 35 mmol/L — ABNORMAL HIGH (ref 20.0–28.0)
Bicarbonate: 35.3 mmol/L — ABNORMAL HIGH (ref 20.0–28.0)
Bicarbonate: 35.9 mmol/L — ABNORMAL HIGH (ref 20.0–28.0)
Bicarbonate: 37.3 mmol/L — ABNORMAL HIGH (ref 20.0–28.0)
Calcium, Ion: 0.89 mmol/L — CL (ref 1.15–1.40)
Calcium, Ion: 0.9 mmol/L — ABNORMAL LOW (ref 1.15–1.40)
Calcium, Ion: 0.99 mmol/L — ABNORMAL LOW (ref 1.15–1.40)
Calcium, Ion: 1 mmol/L — ABNORMAL LOW (ref 1.15–1.40)
Calcium, Ion: 1.01 mmol/L — ABNORMAL LOW (ref 1.15–1.40)
Calcium, Ion: 1.02 mmol/L — ABNORMAL LOW (ref 1.15–1.40)
Calcium, Ion: 1.02 mmol/L — ABNORMAL LOW (ref 1.15–1.40)
Calcium, Ion: 1.03 mmol/L — ABNORMAL LOW (ref 1.15–1.40)
Calcium, Ion: 1.03 mmol/L — ABNORMAL LOW (ref 1.15–1.40)
Calcium, Ion: 1.03 mmol/L — ABNORMAL LOW (ref 1.15–1.40)
Calcium, Ion: 1.04 mmol/L — ABNORMAL LOW (ref 1.15–1.40)
HCT: 22 % — ABNORMAL LOW (ref 39.0–52.0)
HCT: 22 % — ABNORMAL LOW (ref 39.0–52.0)
HCT: 24 % — ABNORMAL LOW (ref 39.0–52.0)
HCT: 26 % — ABNORMAL LOW (ref 39.0–52.0)
HCT: 26 % — ABNORMAL LOW (ref 39.0–52.0)
HCT: 26 % — ABNORMAL LOW (ref 39.0–52.0)
HCT: 27 % — ABNORMAL LOW (ref 39.0–52.0)
HCT: 28 % — ABNORMAL LOW (ref 39.0–52.0)
HCT: 28 % — ABNORMAL LOW (ref 39.0–52.0)
HCT: 28 % — ABNORMAL LOW (ref 39.0–52.0)
HCT: 28 % — ABNORMAL LOW (ref 39.0–52.0)
Hemoglobin: 7.5 g/dL — ABNORMAL LOW (ref 13.0–17.0)
Hemoglobin: 7.5 g/dL — ABNORMAL LOW (ref 13.0–17.0)
Hemoglobin: 8.2 g/dL — ABNORMAL LOW (ref 13.0–17.0)
Hemoglobin: 8.8 g/dL — ABNORMAL LOW (ref 13.0–17.0)
Hemoglobin: 8.8 g/dL — ABNORMAL LOW (ref 13.0–17.0)
Hemoglobin: 8.8 g/dL — ABNORMAL LOW (ref 13.0–17.0)
Hemoglobin: 9.2 g/dL — ABNORMAL LOW (ref 13.0–17.0)
Hemoglobin: 9.5 g/dL — ABNORMAL LOW (ref 13.0–17.0)
Hemoglobin: 9.5 g/dL — ABNORMAL LOW (ref 13.0–17.0)
Hemoglobin: 9.5 g/dL — ABNORMAL LOW (ref 13.0–17.0)
Hemoglobin: 9.5 g/dL — ABNORMAL LOW (ref 13.0–17.0)
O2 Saturation: 100 %
O2 Saturation: 100 %
O2 Saturation: 100 %
O2 Saturation: 100 %
O2 Saturation: 96 %
O2 Saturation: 97 %
O2 Saturation: 97 %
O2 Saturation: 97 %
O2 Saturation: 99 %
O2 Saturation: 99 %
O2 Saturation: 99 %
Patient temperature: 36.3
Patient temperature: 36.3
Patient temperature: 36.3
Patient temperature: 36.3
Patient temperature: 36.4
Patient temperature: 36.4
Patient temperature: 36.4
Patient temperature: 36.4
Patient temperature: 36.5
Patient temperature: 36.6
Patient temperature: 36.6
Potassium: 2.5 mmol/L — CL (ref 3.5–5.1)
Potassium: 3.1 mmol/L — ABNORMAL LOW (ref 3.5–5.1)
Potassium: 3.3 mmol/L — ABNORMAL LOW (ref 3.5–5.1)
Potassium: 3.3 mmol/L — ABNORMAL LOW (ref 3.5–5.1)
Potassium: 3.4 mmol/L — ABNORMAL LOW (ref 3.5–5.1)
Potassium: 3.4 mmol/L — ABNORMAL LOW (ref 3.5–5.1)
Potassium: 3.6 mmol/L (ref 3.5–5.1)
Potassium: 3.6 mmol/L (ref 3.5–5.1)
Potassium: 3.7 mmol/L (ref 3.5–5.1)
Potassium: 3.9 mmol/L (ref 3.5–5.1)
Potassium: 4.1 mmol/L (ref 3.5–5.1)
Sodium: 136 mmol/L (ref 135–145)
Sodium: 137 mmol/L (ref 135–145)
Sodium: 137 mmol/L (ref 135–145)
Sodium: 137 mmol/L (ref 135–145)
Sodium: 138 mmol/L (ref 135–145)
Sodium: 138 mmol/L (ref 135–145)
Sodium: 138 mmol/L (ref 135–145)
Sodium: 138 mmol/L (ref 135–145)
Sodium: 139 mmol/L (ref 135–145)
Sodium: 142 mmol/L (ref 135–145)
Sodium: 142 mmol/L (ref 135–145)
TCO2: 25 mmol/L (ref 22–32)
TCO2: 27 mmol/L (ref 22–32)
TCO2: 34 mmol/L — ABNORMAL HIGH (ref 22–32)
TCO2: 34 mmol/L — ABNORMAL HIGH (ref 22–32)
TCO2: 35 mmol/L — ABNORMAL HIGH (ref 22–32)
TCO2: 35 mmol/L — ABNORMAL HIGH (ref 22–32)
TCO2: 36 mmol/L — ABNORMAL HIGH (ref 22–32)
TCO2: 36 mmol/L — ABNORMAL HIGH (ref 22–32)
TCO2: 37 mmol/L — ABNORMAL HIGH (ref 22–32)
TCO2: 37 mmol/L — ABNORMAL HIGH (ref 22–32)
TCO2: 38 mmol/L — ABNORMAL HIGH (ref 22–32)
pCO2 arterial: 28.3 mmHg — ABNORMAL LOW (ref 32–48)
pCO2 arterial: 32 mmHg (ref 32–48)
pCO2 arterial: 36.6 mmHg (ref 32–48)
pCO2 arterial: 38.1 mmHg (ref 32–48)
pCO2 arterial: 40.1 mmHg (ref 32–48)
pCO2 arterial: 40.4 mmHg (ref 32–48)
pCO2 arterial: 40.5 mmHg (ref 32–48)
pCO2 arterial: 42.7 mmHg (ref 32–48)
pCO2 arterial: 43.8 mmHg (ref 32–48)
pCO2 arterial: 44.2 mmHg (ref 32–48)
pCO2 arterial: 44.4 mmHg (ref 32–48)
pH, Arterial: 7.474 — ABNORMAL HIGH (ref 7.35–7.45)
pH, Arterial: 7.478 — ABNORMAL HIGH (ref 7.35–7.45)
pH, Arterial: 7.491 — ABNORMAL HIGH (ref 7.35–7.45)
pH, Arterial: 7.496 — ABNORMAL HIGH (ref 7.35–7.45)
pH, Arterial: 7.52 — ABNORMAL HIGH (ref 7.35–7.45)
pH, Arterial: 7.534 — ABNORMAL HIGH (ref 7.35–7.45)
pH, Arterial: 7.542 — ABNORMAL HIGH (ref 7.35–7.45)
pH, Arterial: 7.548 — ABNORMAL HIGH (ref 7.35–7.45)
pH, Arterial: 7.569 — ABNORMAL HIGH (ref 7.35–7.45)
pH, Arterial: 7.576 — ABNORMAL HIGH (ref 7.35–7.45)
pH, Arterial: 7.614 (ref 7.35–7.45)
pO2, Arterial: 108 mmHg (ref 83–108)
pO2, Arterial: 109 mmHg — ABNORMAL HIGH (ref 83–108)
pO2, Arterial: 132 mmHg — ABNORMAL HIGH (ref 83–108)
pO2, Arterial: 145 mmHg — ABNORMAL HIGH (ref 83–108)
pO2, Arterial: 156 mmHg — ABNORMAL HIGH (ref 83–108)
pO2, Arterial: 212 mmHg — ABNORMAL HIGH (ref 83–108)
pO2, Arterial: 237 mmHg — ABNORMAL HIGH (ref 83–108)
pO2, Arterial: 72 mmHg — ABNORMAL LOW (ref 83–108)
pO2, Arterial: 74 mmHg — ABNORMAL LOW (ref 83–108)
pO2, Arterial: 75 mmHg — ABNORMAL LOW (ref 83–108)
pO2, Arterial: 78 mmHg — ABNORMAL LOW (ref 83–108)

## 2024-05-24 LAB — PROTIME-INR
INR: 1.8 — ABNORMAL HIGH (ref 0.8–1.2)
Prothrombin Time: 22.2 s — ABNORMAL HIGH (ref 11.4–15.2)

## 2024-05-24 LAB — GLUCOSE, CAPILLARY
Glucose-Capillary: 62 mg/dL — ABNORMAL LOW (ref 70–99)
Glucose-Capillary: 136 mg/dL — ABNORMAL HIGH (ref 70–99)
Glucose-Capillary: 78 mg/dL (ref 70–99)
Glucose-Capillary: 56 mg/dL — ABNORMAL LOW (ref 70–99)
Glucose-Capillary: 139 mg/dL — ABNORMAL HIGH (ref 70–99)
Glucose-Capillary: 81 mg/dL (ref 70–99)
Glucose-Capillary: 82 mg/dL (ref 70–99)
Glucose-Capillary: 60 mg/dL — ABNORMAL LOW (ref 70–99)
Glucose-Capillary: 66 mg/dL — ABNORMAL LOW (ref 70–99)
Glucose-Capillary: 66 mg/dL — ABNORMAL LOW (ref 70–99)
Glucose-Capillary: 156 mg/dL — ABNORMAL HIGH (ref 70–99)
Glucose-Capillary: 130 mg/dL — ABNORMAL HIGH (ref 70–99)
Glucose-Capillary: 90 mg/dL (ref 70–99)
Glucose-Capillary: 62 mg/dL — ABNORMAL LOW (ref 70–99)
Glucose-Capillary: 77 mg/dL (ref 70–99)
Glucose-Capillary: 68 mg/dL — ABNORMAL LOW (ref 70–99)
Glucose-Capillary: 69 mg/dL — ABNORMAL LOW (ref 70–99)
Glucose-Capillary: 84 mg/dL (ref 70–99)
Glucose-Capillary: 103 mg/dL — ABNORMAL HIGH (ref 70–99)

## 2024-05-24 LAB — RETICULOCYTES
Immature Retic Fract: 8.5 % (ref 2.3–15.9)
RBC.: 3.23 MIL/uL — ABNORMAL LOW (ref 4.22–5.81)
Retic Count, Absolute: 47.5 K/uL (ref 19.0–186.0)
Retic Ct Pct: 1.5 % (ref 0.4–3.1)

## 2024-05-24 LAB — APTT
aPTT: 38 s — ABNORMAL HIGH (ref 24–36)
aPTT: 47 s — ABNORMAL HIGH (ref 24–36)
aPTT: 62 s — ABNORMAL HIGH (ref 24–36)
aPTT: 69 s — ABNORMAL HIGH (ref 24–36)

## 2024-05-24 LAB — BASIC METABOLIC PANEL WITH GFR
Anion gap: 14 (ref 5–15)
Anion gap: 13 (ref 5–15)
BUN: 48 mg/dL — ABNORMAL HIGH (ref 8–23)
BUN: 42 mg/dL — ABNORMAL HIGH (ref 8–23)
CO2: 32 mmol/L (ref 22–32)
CO2: 31 mmol/L (ref 22–32)
Calcium: 8.3 mg/dL — ABNORMAL LOW (ref 8.9–10.3)
Calcium: 8.1 mg/dL — ABNORMAL LOW (ref 8.9–10.3)
Chloride: 94 mmol/L — ABNORMAL LOW (ref 98–111)
Chloride: 94 mmol/L — ABNORMAL LOW (ref 98–111)
Creatinine, Ser: 3.22 mg/dL — ABNORMAL HIGH (ref 0.61–1.24)
Creatinine, Ser: 2.88 mg/dL — ABNORMAL HIGH (ref 0.61–1.24)
GFR, Estimated: 20 mL/min — ABNORMAL LOW
GFR, Estimated: 23 mL/min — ABNORMAL LOW
Glucose, Bld: 54 mg/dL — ABNORMAL LOW (ref 70–99)
Glucose, Bld: 75 mg/dL (ref 70–99)
Potassium: 4.1 mmol/L (ref 3.5–5.1)
Potassium: 3.5 mmol/L (ref 3.5–5.1)
Sodium: 140 mmol/L (ref 135–145)
Sodium: 139 mmol/L (ref 135–145)

## 2024-05-24 LAB — PHOSPHORUS: Phosphorus: 3.9 mg/dL (ref 2.5–4.6)

## 2024-05-24 LAB — CBC
HCT: 28.9 % — ABNORMAL LOW (ref 39.0–52.0)
HCT: 29 % — ABNORMAL LOW (ref 39.0–52.0)
Hemoglobin: 9.8 g/dL — ABNORMAL LOW (ref 13.0–17.0)
Hemoglobin: 9.5 g/dL — ABNORMAL LOW (ref 13.0–17.0)
MCH: 27.8 pg (ref 26.0–34.0)
MCH: 28 pg (ref 26.0–34.0)
MCHC: 33.9 g/dL (ref 30.0–36.0)
MCHC: 32.8 g/dL (ref 30.0–36.0)
MCV: 82.1 fL (ref 80.0–100.0)
MCV: 85.5 fL (ref 80.0–100.0)
Platelets: 129 K/uL — ABNORMAL LOW (ref 150–400)
Platelets: 130 K/uL — ABNORMAL LOW (ref 150–400)
RBC: 3.52 MIL/uL — ABNORMAL LOW (ref 4.22–5.81)
RBC: 3.39 MIL/uL — ABNORMAL LOW (ref 4.22–5.81)
RDW: 14.9 % (ref 11.5–15.5)
RDW: 14.9 % (ref 11.5–15.5)
WBC: 11 K/uL — ABNORMAL HIGH (ref 4.0–10.5)
WBC: 11.9 K/uL — ABNORMAL HIGH (ref 4.0–10.5)
nRBC: 0 % (ref 0.0–0.2)
nRBC: 0 % (ref 0.0–0.2)

## 2024-05-24 LAB — COOXEMETRY PANEL
Carboxyhemoglobin: 1.5 % (ref 0.5–1.5)
Carboxyhemoglobin: 1.8 % — ABNORMAL HIGH (ref 0.5–1.5)
Methemoglobin: 0.7 % (ref 0.0–1.5)
Methemoglobin: 0.7 % (ref 0.0–1.5)
O2 Saturation: 88.5 %
O2 Saturation: 80.2 %
Total hemoglobin: 9.3 g/dL — ABNORMAL LOW (ref 12.0–16.0)
Total hemoglobin: 9.3 g/dL — ABNORMAL LOW (ref 12.0–16.0)

## 2024-05-24 LAB — FERRITIN: Ferritin: >7500 ng/mL — ABNORMAL HIGH (ref 24–336)

## 2024-05-24 LAB — IRON AND TIBC
Iron: 74 ug/dL (ref 45–182)
Saturation Ratios: 57 % — ABNORMAL HIGH (ref 17.9–39.5)
TIBC: 131 ug/dL — ABNORMAL LOW (ref 250–450)
UIBC: 57 ug/dL

## 2024-05-24 LAB — LACTATE DEHYDROGENASE: LDH: 893 U/L — ABNORMAL HIGH (ref 105–235)

## 2024-05-24 LAB — VANCOMYCIN, RANDOM: Vancomycin Rm: 15 ug/mL

## 2024-05-24 LAB — VITAMIN B12: Vitamin B-12: 1359 pg/mL — ABNORMAL HIGH (ref 180–914)

## 2024-05-24 LAB — CG4 I-STAT (LACTIC ACID)
Lactic Acid, Venous: 1 mmol/L (ref 0.5–1.9)
Lactic Acid, Venous: 0.7 mmol/L (ref 0.5–1.9)

## 2024-05-24 LAB — FIBRINOGEN: Fibrinogen: 701 mg/dL — ABNORMAL HIGH (ref 210–475)

## 2024-05-24 LAB — FOLATE: Folate: 8.6 ng/mL

## 2024-05-24 LAB — MAGNESIUM
Magnesium: 2.1 mg/dL (ref 1.7–2.4)
Magnesium: 2 mg/dL (ref 1.7–2.4)

## 2024-05-24 MED ORDER — ROPINIROLE HCL 0.25 MG PO TABS
0.2500 mg | ORAL_TABLET | Freq: Every day | ORAL | Status: DC
  Filled 2024-05-24: qty 1

## 2024-05-24 MED ORDER — ACETAZOLAMIDE SODIUM 500 MG IJ SOLR
500.0000 mg | Freq: Once | INTRAMUSCULAR | Status: AC
  Administered 2024-05-24: 500 mg via INTRAVENOUS
  Filled 2024-05-24: qty 500

## 2024-05-24 MED ORDER — ROPINIROLE HCL 0.25 MG PO TABS
0.2500 mg | ORAL_TABLET | Freq: Every day | ORAL | Status: DC
  Administered 2024-05-24 – 2024-05-30 (×6): 0.25 mg
  Filled 2024-05-24 (×7): qty 1

## 2024-05-24 MED ORDER — STERILE WATER FOR INJECTION IJ SOLN
INTRAMUSCULAR | Status: AC
  Filled 2024-05-24: qty 10

## 2024-05-24 MED ORDER — VANCOMYCIN HCL 750 MG/150ML IV SOLN
750.0000 mg | Freq: Once | INTRAVENOUS | Status: AC
  Administered 2024-05-24: 750 mg via INTRAVENOUS
  Filled 2024-05-24: qty 150

## 2024-05-24 MED ORDER — POTASSIUM CHLORIDE 10 MEQ/50ML IV SOLN
10.0000 meq | INTRAVENOUS | Status: AC
  Administered 2024-05-24 – 2024-05-25 (×4): 10 meq via INTRAVENOUS
  Filled 2024-05-24 (×3): qty 50

## 2024-05-24 MED ORDER — PROSOURCE PLUS PO LIQD
30.0000 mL | Freq: Three times a day (TID) | ORAL | Status: DC
  Administered 2024-05-24 – 2024-05-25 (×3): 30 mL via ORAL
  Filled 2024-05-24 (×4): qty 30

## 2024-05-24 MED ORDER — FUROSEMIDE 10 MG/ML IJ SOLN
40.0000 mg | Freq: Once | INTRAMUSCULAR | Status: AC
  Administered 2024-05-24: 40 mg via INTRAVENOUS
  Filled 2024-05-24: qty 4

## 2024-05-24 MED ORDER — DEXTROSE 10 % IV SOLN: INTRAVENOUS | Status: DC

## 2024-05-24 MED ORDER — ADULT MULTIVITAMIN W/MINERALS CH
1.0000 | ORAL_TABLET | Freq: Every day | ORAL | Status: AC
  Administered 2024-05-24 – 2024-05-29 (×5): 1 via ORAL
  Filled 2024-05-24 (×6): qty 1

## 2024-05-24 NOTE — Progress Notes (Addendum)
 "    Advanced Heart Failure Rounding Note  Cardiologist: None   [facility] Cardiologist: Dr. Rolan  Chief Complaint: Late presenting anterior MI, acute CHF Patient Profile   Philip Richardson is a 68 y.o. male with history of poorly controlled DM II, HTN, HLD and CKD IIIb. Admitted with late presenting anterior STEMI and acute systolic CHF.  Significant events:   12/18: LHC 100% p LAD treated with 3 overlapping DES, residual 90% p diagonal post PCI. LVEDP 24 mmHg. 12/19 IABP placed for progressive shock 12/20 Cannulated for VA ECMO   Subjective:    Remains on VA ECMO with IABP vent. Had severe SIRS response initially with VA ECMO but improved methylene blue  and adjustment of drips  Pressors way down . Now just on low-dose epi. NE and VP stopped Co-ox 80%  POCUS echo today EF 20-25% Impella ok   Back in NSR on IV amio   Feels much better    Scr improving. Making urine  Bicarb remains elevated  PAP: (14-51)/(10-25) 25/18 CVP:  [7 mmHg-25 mmHg] 11 mmHg   Objective:    94.5 kg Body mass index is 27.49 kg/m.   Vital Signs:   Temp:  [97 F (36.1 C)-97.7 F (36.5 C)] 97.5 F (36.4 C) (12/21 2145) Pulse Rate:  [70-82] 73 (12/21 2100) Resp:  [2-28] 14 (12/21 2145) BP: (94)/(76) 94/76 (12/21 1450) SpO2:  [96 %-100 %] 98 % (12/21 2100) Arterial Line BP: (13-171)/(-1-89) 168/60 (12/21 2145) Weight:  [94.5 kg] 94.5 kg (12/21 1450) Last BM Date : 05/24/24  Weight change: Filed Weights   05/23/24 1426 05/24/24 0600 05/24/24 1450  Weight: 90 kg 94.5 kg 94.5 kg    Intake/Output:   Intake/Output Summary (Last 24 hours) at 05/24/2024 2150 Last data filed at 05/24/2024 2100 Gross per 24 hour  Intake 2293.36 ml  Output 3090 ml  Net -796.64 ml     Physical Exam   General:  Lying in bed. No resp difficulty HEENT: normal Neck: supple. RIJ swan.  Cor: Regular rate & rhythm. No rubs, gallops or murmurs. Lungs: clear Abdomen: soft, nontender, nondistended.Good bowel  sounds. Extremities: no cyanosis, clubbing, rash, 1+ edema IABP and ecmo cannuals ok  no hematoma Neuro: alert & orientedx3, cranial nerves grossly intact. moves all 4 extremities w/o difficulty. Affect pleasant   Telemetry  Sinus 70s Personally reviewed  Labs   CBC Recent Labs    05/24/24 0428 05/24/24 0624 05/24/24 1700 05/24/24 1713 05/24/24 1820 05/24/24 2015  WBC 11.0*  --  11.9*  --   --   --   HGB 9.8*   < > 9.5*   < > 7.5* 9.5*  HCT 28.9*   < > 29.0*   < > 22.0* 28.0*  MCV 82.1  --  85.5  --   --   --   PLT 129*  --  130*  --   --   --    < > = values in this interval not displayed.   Basic Metabolic Panel Recent Labs    87/78/74 0428 05/24/24 0624 05/24/24 1700 05/24/24 1713 05/24/24 1820 05/24/24 2015  NA 140   < > 139   < > 142 137  K 4.1   < > 3.5   < > 2.5* 3.3*  CL 94*  --  94*  --   --   --   CO2 32  --  31  --   --   --   GLUCOSE 54*  --  75  --   --   --   BUN 48*  --  42*  --   --   --   CREATININE 3.22*  --  2.88*  --   --   --   CALCIUM  8.3*  --  8.1*  --   --   --   MG 2.1  --  2.0  --   --   --   PHOS  --   --  3.9  --   --   --    < > = values in this interval not displayed.   Liver Function Tests Recent Labs    05/23/24 0809 05/24/24 0428  AST 322* 756*  ALT 239* 534*  ALKPHOS 89 89  BILITOT 1.0 0.9  PROT 4.3* 4.8*  ALBUMIN  2.3* 2.7*   No results for input(s): LIPASE, AMYLASE in the last 72 hours. Cardiac Enzymes No results for input(s): CKTOTAL, CKMB, CKMBINDEX, TROPONINI in the last 72 hours.  BNP: BNP (last 3 results) No results for input(s): BNP in the last 8760 hours.  ProBNP (last 3 results) Recent Labs    05/21/24 1855  PROBNP 18,891.0*     D-Dimer No results for input(s): DDIMER in the last 72 hours. Hemoglobin A1C No results for input(s): HGBA1C in the last 72 hours.  Fasting Lipid Panel No results for input(s): CHOL, HDL, LDLCALC, TRIG, CHOLHDL, LDLDIRECT in the last 72  hours.  Medications:   Scheduled Medications:  (feeding supplement) PROSource Plus  30 mL Oral TID BM   aspirin   81 mg Per Tube Daily   Chlorhexidine  Gluconate Cloth  6 each Topical Q0600   clopidogrel   75 mg Per Tube Daily   metoCLOPramide  (REGLAN ) injection  5 mg Intravenous Q8H   multivitamin with minerals  1 tablet Oral Daily   pantoprazole  (PROTONIX ) IV  40 mg Intravenous QHS   rOPINIRole   0.25 mg Per Tube Daily   rosuvastatin   10 mg Per Tube Daily   sodium chloride  flush  10-40 mL Intracatheter Q12H   vancomycin  variable dose per unstable renal function (pharmacist dosing)   Does not apply See admin instructions    Infusions:  sodium chloride      albumin  human Stopped (05/23/24 1130)   amiodarone  60 mg/hr (05/24/24 2100)   bivalirudin  (ANGIOMAX ) 250 mg in sodium chloride  0.9 % 500 mL (0.5 mg/mL) infusion 0.0303 mg/kg/hr (05/24/24 2100)   dextrose  50 mL/hr at 05/24/24 2100   epinephrine  1 mcg/min (05/24/24 2100)   meropenem  (MERREM ) IV Stopped (05/24/24 1014)   norepinephrine  (LEVOPHED ) Adult infusion Stopped (05/24/24 0217)   potassium chloride  10 mEq (05/24/24 2116)   vasopressin  Stopped (05/24/24 0928)    PRN Medications: sodium chloride , acetaminophen , albumin  human, guaiFENesin -dextromethorphan , ondansetron  (ZOFRAN ) IV, mouth rinse, phenol  Assessment/Plan   1. CAD: Late-presenting anterior STEMI, symptoms began on 12/14.  Cath was done showing occluded LAD that was treated with overlapping DES x 3.  There was residual 80% D1. Symptoms had a pleuritic character pre-cath, and he continues to have significant pleuritic chest pain (not improved by PCI).  ECG post-cath showed persistent anterior STE.  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  No significant pericardial effusion present.  - Continue ASA 81 and ticagrelor .  - Continue Crestor  10 mg daily, has not been able to tolerate statins in the past so will start low dose.  - Continue colchicine  0.6 mg daily  (renal dosing) given concern for post-infarct pericarditis, morphine  prn. Pain resolved. Only small effusion on  POCUS echo  2. Acute systolic CHF: Ischemic cardiomyopathy. SCAI stage D - POCUS echo EF 25% - cMRI 30% + LV clot - Now on VA ECMO (12/20) with IABP vent. Pressors way down. Now just on epi 1. Will continue today - Lactate has cleared - With poorly controlled DM2 and CKD likely not candidate for advanced therapies. Goal is to attempt to wean to recovery - Give one dose lasix  and diamox  - IABP and ECMO parameters reviewed personally. See ECMO flowsheet. IABP now 1:1 as he is back in SR 3. AKI on CKD stage 3b: Suspect diabetic nephropathy. Creatinine 2.2>2.7 (baseline appears to be low 2s) - Renal function improving with support Scr 3.22 -> 2.88 - no need for CVVHD at this point. D/w Renal 4. AF with RVR - continue IV amio and bival 4. Type 2 DM: Poor control with HgbA1c 13.1.   - sugars now low due to insulin  glargine - on D10 drip. Follow CBGs closely. D/w CCM and PharmD  - Eventually restart Farxiga .  5. LV thrombus on cMRI  - on bival   CRITICAL CARE Performed by: Abrea Henle  Total critical care time: 55 minutes  Critical care time was exclusive of separately billable procedures and treating other patients.  Critical care was necessary to treat or prevent imminent or life-threatening deterioration.  Critical care was time spent personally by me (independent of midlevel providers or residents) on the following activities: development of treatment plan with patient and/or surrogate as well as nursing, discussions with consultants, evaluation of patient's response to treatment, examination of patient, obtaining history from patient or surrogate, ordering and performing treatments and interventions, ordering and review of laboratory studies, ordering and review of radiographic studies, pulse oximetry and re-evaluation of patient's condition.   Length of Stay:  3  Toribio Fuel, MD  05/24/2024, 9:50 PM  Advanced Heart Failure Team Pager [phone] (M-F; 7a - 5p)   Please visit [email or web address]: For overnight coverage please call cardiology fellow first. If fellow not available call Shock/ECMO MD on call.  For ECMO / Mechanical Support (Impella, IABP, LVAD) issues call Shock / ECMO MD on call.      "

## 2024-05-24 NOTE — Consult Note (Signed)
 "                              Consultation Note Date: 05/24/2024   Patient Name: Philip Richardson  DOB: April 26, 1956  MRN: [id number]  Age / Sex: 68 y.o., male  PCP: Gladis Mustard, FNP Referring Physician: Cherrie Toribio SAUNDERS, MD  Reason for Consultation: Establishing goals of care  HPI/Patient Profile: 68 y.o. male  with past medical history significant of diabetes and chronic kidney disease admitted on 05/21/2024 with chest pain/STEMI.   Patient has no personal history of cardiac disease.  He developed substernal chest pain at church on Sunday.  His symptoms lasted several hours and then dissipated.  He called EMS today due to recurrent symptoms and a code STEMI was paged out.  No other recent health issues denies any surgeries, history of stroke, or history of bleeding.  He takes insulin  and oral hypoglycemic for treatment of diabetes.  Significant events: 05/21/2024: LHC 100%.  LAD treated with 3 overlapping DES, residual 90%.  Diagonal post PCI.  LVEDP 24 mmHg. 05/22/2024: IABP placed for progressive shock 05/23/2024: Cannulated for VA ECMO.  Currently, patient on [place] ECMO with a IABP vent.  Had severe SIRS response initially with VA ECMO but improved methylene blue  and adjustments of drips.  PMT has been consulted to assist with goals of care conversation. Patient/Family face treatment option decisions, advanced directive decisions and anticipatory care needs.   Family face treatment option decision, advance directive decisions and anticipatory care needs.   Clinical Assessment and Goals of Care:  I have reviewed medical records including: EPIC notes: Reviewed advanced heart failure rounding note from 05/23/2024 detailing plan of care mainly for STEMI, acute systolic CHF.  Patient currently on [place] ECMO with a IABP vent.  Reviewed to track clinical course and prognostication.  MAR: Reviewed PRN meds received over the last 24 hours. Reviewed to assess needs for medication adjustment  to optimize comfort.  Available advanced directives in ACP: None currently.  Patient denies pre-existing advance directive documents.  Met with patient/family to discuss diagnosis prognosis, GOC, EOL wishes, disposition and options.  I introduced Palliative Medicine as specialized medical care for people living with serious illness. It focuses on providing relief from the symptoms and stress of a serious illness. The goal is to improve quality of life for both the patient and the family.  Created space and opportunity for family to explore thoughts and feelings regarding patient's current medical condition.   Patient is observed resting comfortably in bed. No family members present. Not in any form of acute distress. No signs or gestures indicating pain or discomfort. Alert and oriented x 3, able to make needs known. Able to engage in a meaningful conversation.    During todays encounter, I met with the patient to review the reason for the current hospital stay and to assess his understanding of the patients acute  medical conditions, as well as the overall prognosis.   The patient and/or family were able to articulate the primary reason for hospitalization, he shared that he all of a sudden developed substernal chest pain at church last Sunday.  He called EMS due to recurrent symptoms.  He understands that he is currently in the ICU and is being treated for STEMI/cardiogenic shock.  He demonstrates understanding of the seriousness of his current medical condition, able to recognize that he is on ECMO.  Understands that this is a form of advanced  life support he is planning the heart and sometimes lungs are unable to provide adequate circulation despite maximal medical therapy.   I explained that the health journey ahead is expected to be challenging, with likely setbacks and limited potential for meaningful recovery. The patient acknowledged and expressed understanding.  We discussed the  importance of establishing clear goals of care, ensuring that all interventions and treatments align with the patients beliefs, values, and preferences. The patient was encouraged to share their wishes and priorities, and we reviewed how our care plan can best support these goals, whether that involves pursuing aggressive interventions or focusing on comfort measures.  Briefly discussed CODE STATUS with the patient, who is currently full code and supported on [place] ECMO.  Patient expressed a desire to continue pursuing aggressive interventions at this time.  Reviewed that CODE STATUS is an ongoing discussion and may be revisited once he is more clinically stabilized, no longer requires ECMO support.  Shortly after, I contacted the patient's significant other, [name].  Introduced myself as PMT provider and explained my role.  Provided a medical update and reviewed the patient's clinical course.  Goals of care discussed, and I shared that, at this time, the patient has expressed a desire to remain full code and pursue full scope, aggressive medical interventions in hopes of clinical improvement.  Advance care planning was reviewed, including the importance of advanced directives such as a living will and HCPOA.  Burnard acknowledged the importance of these discussions and stated she would emphasize this with the patient.  She expressed alignment with the patient's current goals of care and appreciation for me reaching out.  The current medical plan was reviewed, with a focus on aggressive medical interventions and optimizing symptom control. I explained that we will continue to address treatable issues as appropriate, while also allowing time to assess outcomes and adjust the plan as needed.  This is in alignment with patient's values beliefs, and preferences.  Discussed the importance of continued conversation with family and their  medical providers regarding overall plan of care and treatment options, ensuring  decisions are within the context of the patients values and GOCs. The patient, family, and medical team were encouraged to revisit these discussions regularly, as preferences and circumstances may change over time.   A  discussion was had today regarding advanced directives. Values and goals of care important to patient and family were attempted to be elicited.  Questions and concerns were addressed.  Hard Choices booklet left for review. The family was encouraged to call with questions or concerns.  PMT will continue to support holistically.  Social History: Lives with significant other Charleen. No children. 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 Has strong faith.   Functional and Nutritional State: Patient is currently with functional limitation due to acute and chronic illnesses. Ambulatory with no assitive devices at baseline.   Palliative Symptoms: Generalized weakness.   Advance Directives: A detailed discussion regarding advanced directives was held with the patient and family today, emphasizing the importance of these documents in ensuring that the patients healthcare preferences are respected in the event they are unable to communicate their wishes. The patient has not previously executed an advanced directive. Assistance with completing these documents was offered through our chaplain services. Declined at this time but is amenable to completing one once he is clinically improved.   Code Status: Full Code  Primary Decision Maker: Patient    SUMMARY OF RECOMMENDATIONS   #  Complex medical decision making/goals of care Code Status: Full code Goal of care is medical stabilization and recovery to the extent this is possible per patient's desire. Spiritual care consult - Declined at this time. Palliative medicine team will continue to follow.   Symptom Management: Per PCCM team Palliative medicine is available to assist as needed.    # Psycho-social  support Continue to provide psycho-social and emotional support to patient and family.  Code Status/Advance Care Planning: Full code  Palliative Prophylaxis:  Aspiration, Bowel Regimen, Frequent Pain Assessment, Oral Care, and Turn Reposition   Prognosis:  Unable to determine  Discharge Planning: To Be Determined    Discussed with: Patient, patient's SO Charleen, nursing staff and medical team     Primary Diagnoses: Present on Admission:  STEMI involving left anterior descending coronary artery [facility])  Cardiogenic shock [facility])    Physical Exam Vitals and nursing note reviewed.  HENT:     Head: Normocephalic and atraumatic.  Neck:     Comments: Right internal jugular swan Pulmonary:     Effort: Pulmonary effort is normal.  Abdominal:     General: Abdomen is flat.     Palpations: Abdomen is soft.  Musculoskeletal:     Comments: Generalized weakness   Skin:    General: Skin is warm and dry.  Neurological:     General: No focal deficit present.     Mental Status: He is alert.  Psychiatric:        Mood and Affect: Mood normal.     Vital Signs: BP 94/76   Pulse 78   Temp (!) 97.5 F (36.4 C) (Core)   Resp 16   Wt 94.5 kg   SpO2 99%   BMI 27.49 kg/m  Pain Scale: 0-10 POSS *See Group Information*: S-Acceptable,Sleep, easy to arouse Pain Score: 0-No pain   SpO2: SpO2: 99 % O2 Device:SpO2: 99 % O2 Flow Rate: .O2 Flow Rate (L/min): 2 L/min   Palliative Assessment/Data: 30 to 40%    I personally spent a total of 90 minutes in the care of the patient today including preparing to see the patient, getting/reviewing separately obtained history, performing a medically appropriate exam/evaluation, counseling and educating, referring and communicating with other health care professionals, documenting clinical information in the EHR, independently interpreting results, communicating results, and coordinating care.     Kathlyne JULIANNA Tracie Mickey, NP  Palliative  Medicine Team Team phone # [phone]  Thank you for allowing the Palliative Medicine Team to assist in the care of this patient. Please utilize secure chat with additional questions, if there is no response within 30 minutes please call the above phone number.  Palliative Medicine Team providers are available by phone from 7am to 7pm daily and can be reached through the team cell phone.  Should this patient require assistance outside of these hours, please call the patient's attending physician.   "

## 2024-05-24 NOTE — Progress Notes (Signed)
 ANTICOAGULATION CONSULT NOTE  Pharmacy Consult for bivalirudin  Indication: LV thrombus + IABP + ECMO  Allergies[1]  Patient Measurements: Weight: 94.5 kg (208 lb 5.4 oz) Heparin  Dosing Weight: 84 kg    Vital Signs: Temp: 97.2 F (36.2 C) (12/21 1015) Temp Source: Core (12/21 0800) Pulse Rate: 74 (12/21 1015)  Labs: Recent Labs    05/22/24 1835 05/22/24 2240 05/23/24 0806 05/23/24 0809 05/23/24 0950 05/23/24 1609 05/23/24 1714 05/23/24 2340 05/23/24 2355 05/24/24 0428 05/24/24 0624 05/24/24 0644 05/24/24 0834 05/24/24 0954  HGB  --    < >  --  9.9*   < > 9.9*  9.9*  --   --    < > 9.8* 7.5* 8.8* 9.5*  --   HCT  --    < >  --  29.7*   < > 28.9*  29.0*  --   --    < > 28.9* 22.0* 26.0* 28.0*  --   PLT  --    < >  --  200  --  152  --   --   --  129*  --   --   --   --   APTT  --   --   --  >200*   < >  --    < > 47*  --  38*  --   --   --  62*  LABPROT  --   --   --  26.6*  --   --   --   --   --  22.2*  --   --   --   --   INR  --   --   --  2.3*  --   --   --   --   --  1.8*  --   --   --   --   HEPARINUNFRC 0.47  --  0.72*  --   --   --   --   --   --   --   --   --   --   --   CREATININE  --    < >  --  3.79*  --  3.58*  --   --   --  3.22*  --   --   --   --    < > = values in this interval not displayed.    Estimated Creatinine Clearance: 24.8 mL/min (A) (by C-G formula based on SCr of 3.22 mg/dL (H)).  Medical History: Past Medical History:  Diagnosis Date   Frequency of urination    GERD (gastroesophageal reflux disease)    Horseshoe kidney    BILATERAL   Hypertension    Renal calculus, bilateral    Type 2 diabetes mellitus (HCC)    Urgency of urination    Wears dentures      Assessment: 89 yoM presents as late presenting STEMI s/p overlapping DES x3 to LAD with ischemic CMP (EF 40% TTE, 30% cMRI) with LV thrombus on cMRI.  Not on anticoagulation prior to admission.  Pharmacy consulted for heparin  dosing. Pt s/p IABP placement and then VA-ECMO  cannulation on 12/20. Pharmacy to transition to bivalirudin .  aPTT is therapeutic at 62 seconds. Some oozing at lines but no major S/Sx bleeding. CBC ok.  Goal of Therapy:  aPTT 50-70 seconds Monitor platelets by anticoagulation protocol: Yes   Plan:  Bivalirudin  0.03mg /kg/hr - dosing wt 84.2kg Check aPTT q12h - 5a/5p  Ozell Jamaica, PharmD, BCPS,  BCCP Clinical Pharmacist [phone] Please check AMION for all [facility] Pharmacy numbers 05/24/2024         [1]  Allergies Allergen Reactions   Atorvastatin  Other (See Comments)    Myopathy/weakness   Invokana  [Canagliflozin ] Other (See Comments)    weakness   Semaglutide  Other (See Comments)    Heartburn

## 2024-05-24 NOTE — CV Procedure (Signed)
 ECMO DAILY NOTE:   Indication: Cardiogenic shock   Initial cannulation date: 05/23/24  ECMO day #2    ECMO type: [facility] ECMO   1) 25 FR multi-stage venous drainage in R CFV 2) 19 FR return cannula in L CFA     Daily data:   Flow 4.7 L RPM 3600 Sweep  0.5 L   Labs:   ABG    Component Value Date/Time   PHART 7.478 (H) 05/24/2024 2015   PCO2ART 44.2 05/24/2024 2015   PO2ART 78 (L) 05/24/2024 2015   HCO3 33.0 (H) 05/24/2024 2015   TCO2 34 (H) 05/24/2024 2015   ACIDBASEDEF 8.0 (H) 05/23/2024 0950   O2SAT 97 05/24/2024 2015    Hgb 9.5 Platelets 130k  LDH 893 PTT 69 Lactic acid 0.7   Plan:  Continue ECMO support with IABP vent Not Impella candidate with LV clot Goal for recovery    Toribio Fuel, MD  9:57 PM

## 2024-05-24 NOTE — Progress Notes (Signed)
 ANTICOAGULATION CONSULT NOTE  Pharmacy Consult for bivalirudin  Indication: LV thrombus + IABP + ECMO  Allergies[1]  Patient Measurements: Height: 6' 1 (185.4 cm) Weight: 94.5 kg (208 lb 5.4 oz) IBW/kg (Calculated) : 79.9 Heparin  Dosing Weight: 84 kg HEPARIN  DW (KG): 94.5  Vital Signs: Temp: 97.3 F (36.3 C) (12/21 1900) Temp Source: Core (12/21 1600) BP: 94/76 (12/21 1450) Pulse Rate: 73 (12/21 1900)  Labs: Recent Labs    05/22/24 1835 05/22/24 2240 05/23/24 0806 05/23/24 0809 05/23/24 0950 05/23/24 1609 05/23/24 1714 05/24/24 0428 05/24/24 0624 05/24/24 0954 05/24/24 1324 05/24/24 1700 05/24/24 1713 05/24/24 1820  HGB  --    < >  --  9.9*   < > 9.9*  9.9*   < > 9.8*   < >  --    < > 9.5* 8.8* 7.5*  HCT  --    < >  --  29.7*   < > 28.9*  29.0*   < > 28.9*   < >  --    < > 29.0* 26.0* 22.0*  PLT  --    < >  --  200  --  152  --  129*  --   --   --  130*  --   --   APTT  --   --   --  >200*   < >  --    < > 38*  --  62*  --  69*  --   --   LABPROT  --   --   --  26.6*  --   --   --  22.2*  --   --   --   --   --   --   INR  --   --   --  2.3*  --   --   --  1.8*  --   --   --   --   --   --   HEPARINUNFRC 0.47  --  0.72*  --   --   --   --   --   --   --   --   --   --   --   CREATININE  --    < >  --  3.79*  --  3.58*  --  3.22*  --   --   --  2.88*  --   --    < > = values in this interval not displayed.    Estimated Creatinine Clearance: 27.7 mL/min (A) (by C-G formula based on SCr of 2.88 mg/dL (H)).  Medical History: Past Medical History:  Diagnosis Date   Frequency of urination    GERD (gastroesophageal reflux disease)    Horseshoe kidney    BILATERAL   Hypertension    Renal calculus, bilateral    Type 2 diabetes mellitus (HCC)    Urgency of urination    Wears dentures      Assessment: 59 yoM presents as late presenting STEMI s/p overlapping DES x3 to LAD with ischemic CMP (EF 40% TTE, 30% cMRI) with LV thrombus on cMRI.  Not on anticoagulation  prior to admission.  Pharmacy consulted for heparin  dosing. Pt s/p IABP placement and then VA-ECMO cannulation on 12/20. Pharmacy to transition to bivalirudin .  aPTT is therapeutic at 69 seconds on bivalirudin  drip rate 0.03mg /kg/hr. Some oozing at lines but no major S/Sx bleeding. CBC ok.  Goal of Therapy:  aPTT 50-70 seconds Monitor platelets by anticoagulation protocol: Yes  Plan:  Continue Bivalirudin  0.03mg /kg/hr - dosing wt 84.2kg Check aPTT q12h - 5a/5p    Olam Chalk Pharm.D. CPP, BCPS Clinical Pharmacist [phone] 05/24/2024 7:12 PM   Please check AMION for all [facility] Pharmacy numbers 05/24/2024          [1]  Allergies Allergen Reactions   Atorvastatin  Other (See Comments)    Myopathy/weakness   Invokana  [Canagliflozin ] Other (See Comments)    weakness   Semaglutide  Other (See Comments)    Heartburn

## 2024-05-24 NOTE — Plan of Care (Signed)

## 2024-05-24 NOTE — Progress Notes (Signed)
" ° °  NAME:  Philip Richardson, MRN:  [id number], DOB:  December 30, 1955, LOS: 3 ADMISSION DATE:  05/21/2024, CONSULTATION DATE:  12/20 REFERRING MD:  Bensimhon, AHF CHIEF COMPLAINT:  cardiogenic shock    History of Present Illness:  68 year old male with past medical history of poorly controlled T2DM, GERD, HTN, HLD, CKD IIIb admitted 12/18 after presenting as code STEMI. Began having chest pain on Sunday (12/14) which resolved and then recurred.   Cath showed LAD occlusion tx with DES x 3 + IABP due to high LVEDP.  Persistent pain and cardiogenic shock post cath yesterday.  Overnight progressive shock so taken to cath lab.  IABP placed but no improvement so cannulated for VA ECMO.   PCCM consulted to assist with management.  Pertinent  Medical History  DM2 HTN HLD CKD  Significant Hospital Events: Including procedures, antibiotic start and stop dates in addition to other pertinent events   12/18-DES x 3 IABP 12/19 RHC: On NE 15, milrinone  0.375 and VP 0.02 Findings: Ao =81/58 (65) RA = 12 RV = 17/14 PA =  24/18 (20) PCW = 22 Fick cardiac output/index = 4.4/2.1 Thermo CO/CI =4.6/2.2 SVR = 770  Ao sat = 99% PA sat = 60%, 62% PAPi = 0.5 ECMO cannulation  Interim History / Subjective:  He has turned the corner completely. Nearly off pressors. Denies significant pain or breathlessness.  Objective   Blood pressure 94/76, pulse 78, temperature (!) 97.5 F (36.4 C), temperature source Core, resp. rate 16, weight 94.5 kg, SpO2 99%. PAP: (15-51)/(12-36) 24/18 CVP:  [10 mmHg-32 mmHg] 16 mmHg PCWP:  [13 mmHg] 13 mmHg CO:  [3.7 L/min] 3.7 L/min CI:  [1.8 L/min/m2] 1.8 L/min/m2      Intake/Output Summary (Last 24 hours) at 05/24/2024 0933 Last data filed at 05/24/2024 0600 Gross per 24 hour  Intake 3557.12 ml  Output 1885 ml  Net 1672.12 ml   Filed Weights   05/22/24 1035 05/23/24 1426 05/24/24 0600  Weight: 84.2 kg 90 kg 94.5 kg    Examination: No distress IABP on auscultation of  heart +BS per RN when balloon pump paused Minimal NGT output RASS 0, oriented, pulses intact x 4 ECMO circuit looks fine Mild ooziness from cannulation sites that has since improved  ECMO ECMO Mode: VA ECMO Length: 35 ECMO Pump Hours: 35 ECMO Device: Cardiohelp ECMO Additional Device: Heater, NIRS Heater Temperature: 98.6 F (37 C) ECMO Gases Sweep Gas (LPM): 0.5 FiO2 (%): 90  CXR mild R effusion LDH ok  Resolved Hospital Problem list   N/A  Assessment & Plan:  Late presenting STEMI s/p DES x 3 + IABP 12/19 complicated by post MI progressive cardiogenic shock VA ECMO cannulation fem/fem 05/23/24 AKI on CKD3 Acute hypoxemic respiratory failure- currently 4LPM [place] ABLA expected post cannulation Shock state mixed improving Probable LV thrombus- precludes impella Poorly controlled DM now with hypOglycemia  - NGT to gravity - Try clear liquid diet otherwise will need IV dextrose  - Bival 50-70 - Keep eye on platelets - Wean off vasopressin  - May actually be able to do a rapid wean, will d/w AHF   "

## 2024-05-24 NOTE — Progress Notes (Signed)
 " [facility] Progress Note    Assessment/ Plan:   AKI on CKD3b, nonoliguric -followed by Dr.Lateef OP. AKI likely secondary to ATN in the context of shock and contrast-induced injury.  No obstruction on u/s.  -Cr down to 3.58 yesterday, AM labs pending. -Will hold off on starting CRRT via ECMO circuit at this current moment but would have a low threshold -Avoid nephrotoxic medications including NSAIDs and iodinated intravenous contrast exposure unless the latter is absolutely indicated.  Preferred narcotic agents for pain control are hydromorphone , fentanyl , and methadone. Morphine  should not be used. Avoid Baclofen and avoid oral sodium phosphate and magnesium  citrate based laxatives / bowel preps. Continue strict Input and Output monitoring. Will monitor the patient closely with you and intervene or adjust therapy as indicated by changes in clinical status/labs    STEMI Cardiogenic shock -s/p PCI -with IABP and VA ECMO -pressor support per CCM and AHF   AHRF -on HFNC   Alkalosis -possibly secondary to VA ECMO? -d/c'ed bicarb (hasn't been on this)   Hypervolemic Hyponatremia -improving, Na up to 137   Anemia/ABLA -transfuse PRN for Hgb <7.Hgb 7.8, post op bleed likely   Hypocalcemia -replete PRN   Discussed with CCM and ICU RN.  Subjective:   Patient seen and examined bedside. No complaints. UOP 2L Pressors requirements have significantly improved: epi, vaso   Objective:   BP 94/76   Pulse 77   Temp (!) 97.5 F (36.4 C)   Resp 15   Wt 94.5 kg   SpO2 100%   BMI 27.49 kg/m   Intake/Output Summary (Last 24 hours) at 05/24/2024 0804 Last data filed at 05/24/2024 0600 Gross per 24 hour  Intake 3662.66 ml  Output 1960 ml  Net 1702.66 ml   Weight change: 5.812 kg  Physical Exam: Gen: ill appearing, NAD CVS: irreg Resp: cta bl Abd: soft Ext: 1+ edema bl le's Neuro: awake, alert  Imaging: DG CHEST PORT 1 VIEW Result Date: 05/24/2024 EXAM:  1 VIEW(S) XRAY OF THE CHEST 05/24/2024 05:55:00 AM COMPARISON: 05/23/2024 CLINICAL HISTORY: Patient receiving ECMO FINDINGS: LINES, TUBES AND DEVICES: Intra-aortic balloon pump in place with tip in proximal descending thoracic aorta, unchanged. Inferior approach ECMO cannula in place with tip in right atrium. Right IJ Swan-Ganz catheter in place with tip in right main pulmonary artery. Nasogastric tube in place coursing below diaphragm with tip and side port beyond inferior image margin. Right PICC in place with tip in right atrium. LUNGS AND PLEURA: Mild bilateral interstitial thickening. Low lung volumes. Mild bibasilar atelectasis. Trace right pleural effusion. No pneumothorax. HEART AND MEDIASTINUM: Coronary artery stent in place. No acute abnormality of the cardiac and mediastinal silhouettes. BONES AND SOFT TISSUES: No acute osseous abnormality. IMPRESSION: 1. Intra-aortic balloon pump, ECMO cannula, Swan-Ganz catheter, nasogastric tube, and right PICC in place. 2. Mild bilateral interstitial thickening, low lung volumes, and mild bibasilar atelectasis. 3. Trace right pleural effusion. Electronically signed by: Waddell Calk MD 05/24/2024 07:53 AM EST RP Workstation: [id number]   US  RENAL Result Date: 05/23/2024 EXAM: US  Retroperitoneum Complete, Renal. 05/23/2024 05:27:00 PM TECHNIQUE: Real-time ultrasonography of the retroperitoneum renal was performed. COMPARISON: US  Renal 05/31/2015. MRI abdomen 10/07/2019. CLINICAL HISTORY: AKI (acute kidney injury). FINDINGS: Horseshoe kidney. RIGHT KIDNEY/URETER: Right kidney moiety measures 13.5 x 6.6 x 4.2 cm. Shadowing calculus in the right renal moiety measuring 1.0 mm. Mild prominence of the right renal pelvis. Increased cortical echogenicity. No mass. LEFT KIDNEY/URETER: Left kidney moiety measures 10.9 x 5.3 x 4.2  cm. Mild prominence of the left renal pelvis. Increased cortical echogenicity. No calculus. No mass. BLADDER: Decompressed bladder around a foley  catheter. LIMITATIONS: Limited imaging due to body habitus and mental status. IMPRESSION: 1. Horseshoe kidney. 2. Increased cortical echogenicity compatible with medical renal disease. 3. 1.0 mm shadowing calculus in the right renal moiety. Electronically signed by: Norman Gatlin MD 05/23/2024 07:32 PM EST RP Workstation: [id number]   DG Abd 1 View Result Date: 05/23/2024 EXAM: XR ABDOMEN INTRAOPERATIVE COUNT 1 VIEW(S) XRAY OF THE ABDOMEN 05/23/2024 01:29:00 PM COMPARISON: None available. CLINICAL HISTORY: Encounter for nasogastric (NG) tube placement FINDINGS: No unexpected retained surgical items. Enteric tube in place with tip and side port terminating within the expected location of the distal stomach. ECMO catheter in place extending up to the inferior cavoatrial junction. Moderate colonic stool burden. IMPRESSION: 1. Enteric tube in place with tip and side port terminating within the expected location of the distal stomach. Electronically signed by: Waddell Calk MD 05/23/2024 01:41 PM EST RP Workstation: [id number]   DG Chest Port 1 View Result Date: 05/23/2024 EXAM: 1 VIEW(S) XRAY OF THE CHEST 05/23/2024 08:03:00 AM COMPARISON: 05/23/2024 CLINICAL HISTORY: Patient receiving ECMO FINDINGS: LINES, TUBES AND DEVICES: Right IJ Swan-Ganz catheter with tip overlying right pulmonary artery. Right arm PICC line tip terminates in the right atrium. Radiopaque marker overlying proximal descending thoracic aorta, likely representing aortic balloon catheter. LUNGS AND PLEURA: Low lung volume. Probable atelectasis at left lung base. No pleural effusion. No pneumothorax. HEART AND MEDIASTINUM: Stable mild cardiomegaly. Coronary artery stent noted. BONES AND SOFT TISSUES: No acute osseous abnormality. IMPRESSION: 1. Low lung volume with probable atelectasis at the left lung base, without pleural effusion or pneumothorax. Electronically signed by: Waddell Calk MD 05/23/2024 08:09 AM EST RP Workstation: [id number]    CARDIAC CATHETERIZATION Result Date: 05/23/2024 Successful VA ECMO cannulation for cardiogenic shock.   Port CXR Result Date: 05/23/2024 CLINICAL DATA:  Check central line placement EXAM: PORTABLE CHEST 1 VIEW COMPARISON:  05/21/2024 FINDINGS: Cardiac shadow is mildly prominent but accentuated by the portable technique. Right jugular Swan-Ganz catheter is noted extending into the right pulmonary artery. No pneumothorax is noted. Right PICC is seen extending to the mid right atrium. Lungs are well aerated. Minimal left basilar atelectasis is seen. No bony abnormality is noted. IMPRESSION: Central lines as described.  No pneumothorax is noted. Electronically Signed   By: Oneil Devonshire M.D.   On: 05/23/2024 02:34   CARDIAC CATHETERIZATION Result Date: 05/22/2024 On NE 15, milrinone  0.375 and VP 0.02 Findings: Ao =81/58 (65) RA = 12 RV = 17/14 PA =  24/18 (20) PCW = 22 Fick cardiac output/index = 4.4/2.1 Thermo CO/CI =4.6/2.2 SVR = 770 Ao sat = 99% PA sat = 60%, 62% PAPi = 0.5 Assessment: 1. Cardiogenic shock due to severe biventricular failure Plan/Discussion: Inotropes adjusted. IABP placed. (Unable to place Impella due to LV clot) Toribio Fuel, MD 11:37 PM  MR CARDIAC MORPHOLOGY W WO CONTRAST Result Date: 05/22/2024 CLINICAL DATA:  Cardiomyopathy suspected Assess for LV thrombus EXAM: MR CARDIA MORPHOLOGY WITHOUT AND WITH CONTRAST; MR CARDIAC VELOCITY FLOW MAPPING TECHNIQUE: The patient was scanned on a 1.5 Tesla Siemens magnet. A dedicated cardiac coil was used. Functional imaging was done using TrueFisp sequences. 2,3, and 4 chamber views were done to assess for RWMA's. Modified Simpson's rule using a short axis stack was used to calculate an ejection fraction on a dedicated work [employer]. The patient received 13mL GADAVIST  GADOBUTROL  1 MMOL/ML  IV SOLN. After 10 minutes inversion recovery sequences were used to assess for infiltration and scar tissue. Phase contrast  velocity encoded images obtained x 2. This examination is tailored for evaluation cardiac anatomy and function and provides very limited assessment of noncardiac structures, which are accordingly not evaluated during interpretation. If there is clinical concern for extracardiac pathology, further evaluation with CT imaging should be considered. FINDINGS: 1. The left ventricle is normal in cavity size. There is mild septal hypertrophy. There is moderate-severe left ventricular systolic dysfunction with an LV ejection fraction calculated at 30%. There are regional wall motion abnormalities. The anterior, anteroseptal, anterolateral walls and apex are all akinetic. Systolic function at the base and inferior wall is relatively preserved. On T2 weighted imaging, there is prominence of the T2 signal in the subendocardium of the anterior and more diffusely in the apex suggestive of myocardial edema in these regions. The native T2 values in these regions are prolonged further supporting these findings. 2. The right ventricle is normal in cavity size, wall thickness, and systolic function. There are no RV wall motion abnormalities or aneurysms. 3. Both atria are normal in size. 4. The aortic valve is trileaflet in morphology. There is no significant aortic valve stenosis or regurgitation. There is trivial tricuspid regurgitation, but no significant valvular disease. 5. Delayed enhancement imaging is abnormal. The mid-distal septum, mid-distal lateral wall and apex demonstrate near transmural LGE. The distal septum and apex further demonstrate areas of no-reflow. These regions of myocardium are non-viable. 6. There is a moderate circumferential pericardial effusion. There is systolic collapse of the right atrium, but no evidence of diastolic collapse of the right ventricle or ventircular interdependence to suggest tamponade physiology. The thickness of the pericardium is normal. There does appear to be some enhancement of the  pericardium adjacent to the apical lateral wall consistent with pericardial inflammation. 7. The proximal aorta is normal in size and caliber. 8. There appears to be a small apical thrombus seen best on delayed enhancement imaging. There may also be some layering thrombus in the apex as well. 9. Consolidative opacity in the right middle lung space. Unclear if this represents atelectasis vs infiltrate. Recommend dedicated CT imaging of the lung for further evaluation. CONCLUSIONS: Moderate to severe LV systolic dysfunction due to ischemic cardiomyopathy with <50% viability. T2 prominence in the areas of infarct suggest acute myocardial injury as well. There is evidence of apical thrombus due to apical akinesis. Evidence of mild pericarditis limited to the pericardium adjacent to the apex and lateral wall. Moderate pericardial effusion without tamponade. Recommend dedicated CT chest to better evaluate right middle lung consolidation if clinically appropriate. MEASUREMENTS: LEFT VENTRICLE Left Ventricular End-Diastolic Chamber Size: 3.1 cm Left Ventricular Septal Wall Thickness: 1.3 cm Left Ventricular Posterior Wall Thickness: 0.9 cm Left Ventricular Maximal Wall Thickness: 1.3 cm LV male LV EF: 30% (normal 49-79%) Absolute volumes: LV EDV: (normal 95-215 mL) LV ESV: 77mL (normal 25-85 mL) LV SV: 33mL (normal 61-145 mL) CO: 2.9L/min (normal 3.4-7.8 L/min) Indexed volumes: CI: 1.4L/min/sq-m (normal 1.8-4.2 L/min/sq-m) LV EDV: 55mL/sq-m (normal 50-108 mL/sq-m) LV ESV: 7mL/sq-m (normal 11-47 mL/sq-m) LV SV: 86mL/sq-m (normal 33-72 mL/sq-m) RIGHT VENTRICLE RV male RV EF:  48% (normal 42-72%) Absolute volumes: RV EDV: 86mL (normal 109-217 mL) RV ESV: 45mL (normal 23-91 mL) RV SV: 41mL (normal 71-141 mL) CO: 3.5L/min (normal 2.8-8.8 L/min) Indexed volumes: CI: 1.7L/min/sq-m (normal 1.7-4.2 L/min/sq-m) RV EDV: 85mL/sq-m (normal 58-109 mL/sq-m) RV ESV: 79mL/sq-m (normal 12-46 mL/sq-m) RV SV: 58mL/sq-m (normal 38-71  mL/sq-m) ATRIA Left atrium: Normal size and morphology Right atrium: Normal size and morphology VALVES Aortic valve: The aortic valve is trileaflet without regurgitation or stenosis. Mitral valve: Normal morphology without regurgitation or stenosis. Tricuspid valve: Normal morphology with trivial tricuspid regurgitation. Pulmonic valve: Normal morphology without regurgitation or stenosis. OTHER Consolidative opacity in the right middle lung space. Unclear if this represents atelectasis vs infiltrate. Recommend dedicated CT imaging of the lung for further evaluation. Electronically Signed   By: Georganna Archer   On: 05/22/2024 11:43   MR CARDIAC VELOCITY FLOW MAP Result Date: 05/22/2024 CLINICAL DATA:  Cardiomyopathy suspected Assess for LV thrombus EXAM: MR CARDIA MORPHOLOGY WITHOUT AND WITH CONTRAST; MR CARDIAC VELOCITY FLOW MAPPING TECHNIQUE: The patient was scanned on a 1.5 Tesla Siemens magnet. A dedicated cardiac coil was used. Functional imaging was done using TrueFisp sequences. 2,3, and 4 chamber views were done to assess for RWMA's. Modified Simpson's rule using a short axis stack was used to calculate an ejection fraction on a dedicated work [employer]. The patient received 13mL GADAVIST  GADOBUTROL  1 MMOL/ML IV SOLN. After 10 minutes inversion recovery sequences were used to assess for infiltration and scar tissue. Phase contrast velocity encoded images obtained x 2. This examination is tailored for evaluation cardiac anatomy and function and provides very limited assessment of noncardiac structures, which are accordingly not evaluated during interpretation. If there is clinical concern for extracardiac pathology, further evaluation with CT imaging should be considered. FINDINGS: 1. The left ventricle is normal in cavity size. There is mild septal hypertrophy. There is moderate-severe left ventricular systolic dysfunction with an LV ejection fraction calculated at 30%. There are  regional wall motion abnormalities. The anterior, anteroseptal, anterolateral walls and apex are all akinetic. Systolic function at the base and inferior wall is relatively preserved. On T2 weighted imaging, there is prominence of the T2 signal in the subendocardium of the anterior and more diffusely in the apex suggestive of myocardial edema in these regions. The native T2 values in these regions are prolonged further supporting these findings. 2. The right ventricle is normal in cavity size, wall thickness, and systolic function. There are no RV wall motion abnormalities or aneurysms. 3. Both atria are normal in size. 4. The aortic valve is trileaflet in morphology. There is no significant aortic valve stenosis or regurgitation. There is trivial tricuspid regurgitation, but no significant valvular disease. 5. Delayed enhancement imaging is abnormal. The mid-distal septum, mid-distal lateral wall and apex demonstrate near transmural LGE. The distal septum and apex further demonstrate areas of no-reflow. These regions of myocardium are non-viable. 6. There is a moderate circumferential pericardial effusion. There is systolic collapse of the right atrium, but no evidence of diastolic collapse of the right ventricle or ventircular interdependence to suggest tamponade physiology. The thickness of the pericardium is normal. There does appear to be some enhancement of the pericardium adjacent to the apical lateral wall consistent with pericardial inflammation. 7. The proximal aorta is normal in size and caliber. 8. There appears to be a small apical thrombus seen best on delayed enhancement imaging. There may also be some layering thrombus in the apex as well. 9. Consolidative opacity in the right middle lung space. Unclear if this represents atelectasis vs infiltrate. Recommend dedicated CT imaging of the lung for further evaluation. CONCLUSIONS: Moderate to severe LV systolic dysfunction due to ischemic cardiomyopathy  with <50% viability. T2 prominence in the areas of infarct suggest acute myocardial injury as well. There is evidence of  apical thrombus due to apical akinesis. Evidence of mild pericarditis limited to the pericardium adjacent to the apex and lateral wall. Moderate pericardial effusion without tamponade. Recommend dedicated CT chest to better evaluate right middle lung consolidation if clinically appropriate. MEASUREMENTS: LEFT VENTRICLE Left Ventricular End-Diastolic Chamber Size: 3.1 cm Left Ventricular Septal Wall Thickness: 1.3 cm Left Ventricular Posterior Wall Thickness: 0.9 cm Left Ventricular Maximal Wall Thickness: 1.3 cm LV male LV EF: 30% (normal 49-79%) Absolute volumes: LV EDV: (normal 95-215 mL) LV ESV: 77mL (normal 25-85 mL) LV SV: 33mL (normal 61-145 mL) CO: 2.9L/min (normal 3.4-7.8 L/min) Indexed volumes: CI: 1.4L/min/sq-m (normal 1.8-4.2 L/min/sq-m) LV EDV: 42mL/sq-m (normal 50-108 mL/sq-m) LV ESV: 39mL/sq-m (normal 11-47 mL/sq-m) LV SV: 31mL/sq-m (normal 33-72 mL/sq-m) RIGHT VENTRICLE RV male RV EF:  48% (normal 42-72%) Absolute volumes: RV EDV: 86mL (normal 109-217 mL) RV ESV: 45mL (normal 23-91 mL) RV SV: 41mL (normal 71-141 mL) CO: 3.5L/min (normal 2.8-8.8 L/min) Indexed volumes: CI: 1.7L/min/sq-m (normal 1.7-4.2 L/min/sq-m) RV EDV: 56mL/sq-m (normal 58-109 mL/sq-m) RV ESV: 44mL/sq-m (normal 12-46 mL/sq-m) RV SV: 34mL/sq-m (normal 38-71 mL/sq-m) ATRIA Left atrium: Normal size and morphology Right atrium: Normal size and morphology VALVES Aortic valve: The aortic valve is trileaflet without regurgitation or stenosis. Mitral valve: Normal morphology without regurgitation or stenosis. Tricuspid valve: Normal morphology with trivial tricuspid regurgitation. Pulmonic valve: Normal morphology without regurgitation or stenosis. OTHER Consolidative opacity in the right middle lung space. Unclear if this represents atelectasis vs infiltrate. Recommend dedicated CT imaging of the lung for further  evaluation. Electronically Signed   By: Georganna Archer   On: 05/22/2024 11:43   MR CARDIAC VELOCITY FLOW MAP Result Date: 05/22/2024 CLINICAL DATA:  Cardiomyopathy suspected Assess for LV thrombus EXAM: MR CARDIA MORPHOLOGY WITHOUT AND WITH CONTRAST; MR CARDIAC VELOCITY FLOW MAPPING TECHNIQUE: The patient was scanned on a 1.5 Tesla Siemens magnet. A dedicated cardiac coil was used. Functional imaging was done using TrueFisp sequences. 2,3, and 4 chamber views were done to assess for RWMA's. Modified Simpson's rule using a short axis stack was used to calculate an ejection fraction on a dedicated work [employer]. The patient received 13mL GADAVIST  GADOBUTROL  1 MMOL/ML IV SOLN. After 10 minutes inversion recovery sequences were used to assess for infiltration and scar tissue. Phase contrast velocity encoded images obtained x 2. This examination is tailored for evaluation cardiac anatomy and function and provides very limited assessment of noncardiac structures, which are accordingly not evaluated during interpretation. If there is clinical concern for extracardiac pathology, further evaluation with CT imaging should be considered. FINDINGS: 1. The left ventricle is normal in cavity size. There is mild septal hypertrophy. There is moderate-severe left ventricular systolic dysfunction with an LV ejection fraction calculated at 30%. There are regional wall motion abnormalities. The anterior, anteroseptal, anterolateral walls and apex are all akinetic. Systolic function at the base and inferior wall is relatively preserved. On T2 weighted imaging, there is prominence of the T2 signal in the subendocardium of the anterior and more diffusely in the apex suggestive of myocardial edema in these regions. The native T2 values in these regions are prolonged further supporting these findings. 2. The right ventricle is normal in cavity size, wall thickness, and systolic function. There are no RV wall  motion abnormalities or aneurysms. 3. Both atria are normal in size. 4. The aortic valve is trileaflet in morphology. There is no significant aortic valve stenosis or regurgitation. There is trivial tricuspid regurgitation, but no significant valvular disease. 5.  Delayed enhancement imaging is abnormal. The mid-distal septum, mid-distal lateral wall and apex demonstrate near transmural LGE. The distal septum and apex further demonstrate areas of no-reflow. These regions of myocardium are non-viable. 6. There is a moderate circumferential pericardial effusion. There is systolic collapse of the right atrium, but no evidence of diastolic collapse of the right ventricle or ventircular interdependence to suggest tamponade physiology. The thickness of the pericardium is normal. There does appear to be some enhancement of the pericardium adjacent to the apical lateral wall consistent with pericardial inflammation. 7. The proximal aorta is normal in size and caliber. 8. There appears to be a small apical thrombus seen best on delayed enhancement imaging. There may also be some layering thrombus in the apex as well. 9. Consolidative opacity in the right middle lung space. Unclear if this represents atelectasis vs infiltrate. Recommend dedicated CT imaging of the lung for further evaluation. CONCLUSIONS: Moderate to severe LV systolic dysfunction due to ischemic cardiomyopathy with <50% viability. T2 prominence in the areas of infarct suggest acute myocardial injury as well. There is evidence of apical thrombus due to apical akinesis. Evidence of mild pericarditis limited to the pericardium adjacent to the apex and lateral wall. Moderate pericardial effusion without tamponade. Recommend dedicated CT chest to better evaluate right middle lung consolidation if clinically appropriate. MEASUREMENTS: LEFT VENTRICLE Left Ventricular End-Diastolic Chamber Size: 3.1 cm Left Ventricular Septal Wall Thickness: 1.3 cm Left Ventricular  Posterior Wall Thickness: 0.9 cm Left Ventricular Maximal Wall Thickness: 1.3 cm LV male LV EF: 30% (normal 49-79%) Absolute volumes: LV EDV: (normal 95-215 mL) LV ESV: 77mL (normal 25-85 mL) LV SV: 33mL (normal 61-145 mL) CO: 2.9L/min (normal 3.4-7.8 L/min) Indexed volumes: CI: 1.4L/min/sq-m (normal 1.8-4.2 L/min/sq-m) LV EDV: 22mL/sq-m (normal 50-108 mL/sq-m) LV ESV: 73mL/sq-m (normal 11-47 mL/sq-m) LV SV: 29mL/sq-m (normal 33-72 mL/sq-m) RIGHT VENTRICLE RV male RV EF:  48% (normal 42-72%) Absolute volumes: RV EDV: 86mL (normal 109-217 mL) RV ESV: 45mL (normal 23-91 mL) RV SV: 41mL (normal 71-141 mL) CO: 3.5L/min (normal 2.8-8.8 L/min) Indexed volumes: CI: 1.7L/min/sq-m (normal 1.7-4.2 L/min/sq-m) RV EDV: 102mL/sq-m (normal 58-109 mL/sq-m) RV ESV: 40mL/sq-m (normal 12-46 mL/sq-m) RV SV: 68mL/sq-m (normal 38-71 mL/sq-m) ATRIA Left atrium: Normal size and morphology Right atrium: Normal size and morphology VALVES Aortic valve: The aortic valve is trileaflet without regurgitation or stenosis. Mitral valve: Normal morphology without regurgitation or stenosis. Tricuspid valve: Normal morphology with trivial tricuspid regurgitation. Pulmonic valve: Normal morphology without regurgitation or stenosis. OTHER Consolidative opacity in the right middle lung space. Unclear if this represents atelectasis vs infiltrate. Recommend dedicated CT imaging of the lung for further evaluation. Electronically Signed   By: Georganna Archer   On: 05/22/2024 11:43    Labs: BMET Recent Labs  Lab 05/21/24 0840 05/21/24 9158 05/21/24 1855 05/22/24 9584 05/22/24 1631 05/22/24 2240 05/23/24 0455 05/23/24 0510 05/23/24 0809 05/23/24 9049 05/23/24 1609 05/23/24 2355 05/24/24 0104 05/24/24 0303 05/24/24 0426 05/24/24 0624 05/24/24 0644  NA 131* 131* 132* 129* 126*   < > 127*   < > 131*   < > 135  133* 137 139 138 136 142 137  K 4.1 4.0 4.8 4.8 4.6   < > 4.3   < > 4.3   < > 3.7  3.5 3.6 3.4* 3.6 3.9 3.1* 3.7  CL 96*  97* 95* 93* 89*  --  82*  --  81*  --  87*  --   --   --   --   --   --  CO2 24  --  23 24 20*  --  14*  --  18*  --  31  --   --   --   --   --   --   GLUCOSE 187* 192* 116* 120* 225*  --  480*  --  594*  --  439*  --   --   --   --   --   --   BUN 37* 33* 36* 40* 43*  --  50*  --  48*  --  48*  --   --   --   --   --   --   CREATININE 2.17* 2.20* 2.32* 2.68* 2.80*  --  3.76*  --  3.79*  --  3.58*  --   --   --   --   --   --   CALCIUM  9.0  --  9.4 9.2 8.8*  --  7.9*  --  7.7*  --  8.5*  --   --   --   --   --   --    < > = values in this interval not displayed.   CBC Recent Labs  Lab 05/21/24 0840 05/21/24 0841 05/23/24 0455 05/23/24 0510 05/23/24 0809 05/23/24 0950 05/23/24 1609 05/23/24 2355 05/24/24 0426 05/24/24 0428 05/24/24 0624 05/24/24 0644  WBC 8.8   < > 16.8*  --  15.6*  --  8.9  --   --  11.0*  --   --   NEUTROABS 6.4  --   --   --   --   --   --   --   --   --   --   --   HGB 11.4*   < > 10.2*   < > 9.9*   < > 9.9*  9.9*   < > 9.5* 9.8* 7.5* 8.8*  HCT 34.1*   < > 30.0*   < > 29.7*   < > 28.9*  29.0*   < > 28.0* 28.9* 22.0* 26.0*  MCV 82.6   < > 82.6  --  84.6  --  81.6  --   --  82.1  --   --   PLT 203   < > 247  --  200  --  152  --   --  129*  --   --    < > = values in this interval not displayed.    Medications:     sodium chloride    Intravenous Once   aspirin   81 mg Per Tube Daily   Chlorhexidine  Gluconate Cloth  6 each Topical Q0600   clopidogrel   75 mg Per Tube Daily   metoCLOPramide  (REGLAN ) injection  5 mg Intravenous Q8H   pantoprazole  (PROTONIX ) IV  40 mg Intravenous QHS   rosuvastatin   10 mg Per Tube Daily   sodium chloride  flush  10-40 mL Intracatheter Q12H   sodium chloride  flush  3 mL Intravenous Q12H   sodium chloride  flush  3 mL Intravenous Q12H   vancomycin  variable dose per unstable renal function (pharmacist dosing)   Does not apply See admin instructions      Ephriam Stank, MD [facility] 05/24/2024, 8:04 AM   "

## 2024-05-24 NOTE — Progress Notes (Signed)
 OT Cancellation Note  Patient Details Name: Philip Richardson MRN: [id number] DOB: 14-Apr-1956   Cancelled Treatment:    Reason Eval/Treat Not Completed: Other (comment) (discussed wtih PT/NSG. Pt limited at this time due to R femoral sheath. PT to see today. OT to follow up tomorrow as schedule allows. If pt remains on Echmo/IABP, may benefit from a Tilt bed.)  Aleksis Jiggetts,HILLARY 05/24/2024, 1:23 PM Kreg Sink, OT/L   Acute OT Clinical Specialist Acute Rehabilitation Services Pager [phone] Office [phone]

## 2024-05-24 NOTE — Progress Notes (Signed)
 Pharmacy Antibiotic Note  Philip Richardson is a 68 y.o. male admitted on 05/21/2024 with shock s/p VA ECMO cannulation. Pharmacy has been consulted for vancomycin  and meropenem  dosing.  Vancomycin  random ~24 hours after load is therapeutic at 15 mcg/ml. Cr has risen but appears to have plateued with some UOP.  Plan: Vancomycin  750mg  IV x1 Meropenem  500g IV q12h Follow Cr for further dosing, likely will need VR in 24h pending Cr trend  Weight: 94.5 kg (208 lb 5.4 oz)  Temp (24hrs), Avg:97.4 F (36.3 C), Min:97 F (36.1 C), Max:97.7 F (36.5 C)  Recent Labs  Lab 05/22/24 0415 05/22/24 1015 05/22/24 1631 05/22/24 1910 05/23/24 0455 05/23/24 0514 05/23/24 0809 05/23/24 0817 05/23/24 1201 05/23/24 1609 05/23/24 1613 05/23/24 2104 05/24/24 0428 05/24/24 0439 05/24/24 0923 05/24/24 0954  WBC 17.5*  --   --   --  16.8*  --  15.6*  --   --  8.9  --   --  11.0*  --   --   --   CREATININE 2.68*  --  2.80*  --  3.76*  --  3.79*  --   --  3.58*  --   --  3.22*  --   --   --   LATICACIDVEN  --    < >  --    < >  --    < >  --    < > 9.5*  --  4.5* 1.8  --  1.0 0.7  --   VANCORANDOM  --   --   --   --   --   --   --   --   --   --   --   --   --   --   --  15   < > = values in this interval not displayed.    Estimated Creatinine Clearance: 24.8 mL/min (A) (by C-G formula based on SCr of 3.22 mg/dL (H)).    Allergies[1]    Ozell Jamaica, PharmD, BCPS, [facility] Clinical Pharmacist [phone] Please check AMION for all [facility] Pharmacy numbers 05/24/2024       [1]  Allergies Allergen Reactions   Atorvastatin  Other (See Comments)    Myopathy/weakness   Invokana  [Canagliflozin ] Other (See Comments)    weakness   Semaglutide  Other (See Comments)    Heartburn

## 2024-05-24 NOTE — Progress Notes (Addendum)
 ANTICOAGULATION CONSULT NOTE  Pharmacy Consult for bivalirudin  Indication: LV thrombus /VA  ECMO + IABP  Allergies[1]  Patient Measurements: Weight: 90 kg (198 lb 6.6 oz) Heparin  Dosing Weight: 84 kg    Vital Signs: Temp: 97.5 F (36.4 C) (12/21 0445) Temp Source: Core (12/21 0400) Pulse Rate: 73 (12/21 0445)  Labs: Recent Labs    05/21/24 0840 05/21/24 0841 05/22/24 1835 05/22/24 2240 05/23/24 0455 05/23/24 0510 05/23/24 0806 05/23/24 0809 05/23/24 0950 05/23/24 1609 05/23/24 1714 05/23/24 2340 05/23/24 2355 05/24/24 0303 05/24/24 0426 05/24/24 0428  HGB 11.4*   < >  --    < > 10.2*   < >  --  9.9*   < > 9.9*  9.9*  --   --    < > 8.8* 9.5* 9.8*  HCT 34.1*   < >  --    < > 30.0*   < >  --  29.7*   < > 28.9*  29.0*  --   --    < > 26.0* 28.0* 28.9*  PLT 203   < >  --   --  247  --   --  200  --  152  --   --   --   --   --  129*  APTT >200*  --   --   --   --   --   --  >200*   < >  --  52* 47*  --   --   --  38*  LABPROT 18.2*  --   --   --   --   --   --  26.6*  --   --   --   --   --   --   --  22.2*  INR 1.4*  --   --   --   --   --   --  2.3*  --   --   --   --   --   --   --  1.8*  HEPARINUNFRC  --   --  0.47  --   --   --  0.72*  --   --   --   --   --   --   --   --   --   CREATININE 2.17*   < >  --   --  3.76*  --   --  3.79*  --  3.58*  --   --   --   --   --   --    < > = values in this interval not displayed.    Estimated Creatinine Clearance: 22.3 mL/min (A) (by C-G formula based on SCr of 3.58 mg/dL (H)).  Medical History: Past Medical History:  Diagnosis Date   Frequency of urination    GERD (gastroesophageal reflux disease)    Horseshoe kidney    BILATERAL   Hypertension    Renal calculus, bilateral    Type 2 diabetes mellitus (HCC)    Urgency of urination    Wears dentures      Assessment: 75 yoM presents as late presenting STEMI s/p overlapping DES x3 to LAD with ischemic CMP (EF 40% TTE, 30% cMRI) with LV thrombus on cMRI.  Not on  anticoagulation prior to admission.  Pharmacy consulted for heparin  dosing. Pt s/p IABP palcement and then VA-ECMO cannulation on 12/20. Pharmacy to transition to bivalirudin .  Post cannulation - heparin  took time to clear - aptt finally  down to 50sec (bottom end of therapeutic range) No bleeding noted Will start low dose bivalirudin  drip and slowly titrate to keep aptt in goal range  AM: aPTT slightly below goal on bivalidrudin infusion. Per RN, no issues with infusion running continuously, but has been oozing from groin sites with little improvement. CBC shows Hgb low, stable, plts 152. Will keep same rate and recheck with AM labs  Goal of Therapy:  aPTT 50-70 seconds Monitor platelets by anticoagulation protocol: Yes   Plan:  Continue bivalirudin  to 0.01 mg/kg/hr - dosing wt 84.2kg Check aPTT in 4h with AM labs Q12 aptt and CBC  Monitor s/s bleeding   Lynwood Poplar, PharmD, BCPS Clinical Pharmacist 05/24/2024 4:58 AM   ADDENDUM -aPTT dropped from 47 > 38 (normal range 24-36), CBC shows Hgb low, stable plts slight drop -Per RN, oozing has improved with tiny bit at groin sites -Will increase bival to higher rate given low aPTT  Goal of Therapy:  aPTT 50-70 seconds Monitor platelets by anticoagulation protocol: Yes   Plan:  Start bivalirudin  to 0.03 mg/kg/hr - dosing wt 84.2kg Check aPTT in 4h Q12 aptt and CBC  Monitor s/s bleeding   Lynwood Poplar, PharmD, BCPS Clinical Pharmacist 05/24/2024 5:10 AM      [1]  Allergies Allergen Reactions   Atorvastatin  Other (See Comments)    Myopathy/weakness   Invokana  [Canagliflozin ] Other (See Comments)    weakness   Semaglutide  Other (See Comments)    Heartburn

## 2024-05-25 ENCOUNTER — Inpatient Hospital Stay (HOSPITAL_COMMUNITY)

## 2024-05-25 ENCOUNTER — Encounter (HOSPITAL_COMMUNITY): Payer: Self-pay | Admitting: Internal Medicine

## 2024-05-25 DIAGNOSIS — R410 Disorientation, unspecified: Secondary | ICD-10-CM | POA: Diagnosis not present

## 2024-05-25 DIAGNOSIS — I213 ST elevation (STEMI) myocardial infarction of unspecified site: Secondary | ICD-10-CM

## 2024-05-25 DIAGNOSIS — I639 Cerebral infarction, unspecified: Secondary | ICD-10-CM

## 2024-05-25 DIAGNOSIS — I959 Hypotension, unspecified: Secondary | ICD-10-CM

## 2024-05-25 DIAGNOSIS — I7090 Unspecified atherosclerosis: Secondary | ICD-10-CM | POA: Diagnosis not present

## 2024-05-25 DIAGNOSIS — N179 Acute kidney failure, unspecified: Secondary | ICD-10-CM | POA: Diagnosis not present

## 2024-05-25 DIAGNOSIS — G9389 Other specified disorders of brain: Secondary | ICD-10-CM

## 2024-05-25 DIAGNOSIS — I429 Cardiomyopathy, unspecified: Secondary | ICD-10-CM | POA: Diagnosis not present

## 2024-05-25 DIAGNOSIS — I672 Cerebral atherosclerosis: Secondary | ICD-10-CM | POA: Diagnosis not present

## 2024-05-25 DIAGNOSIS — R570 Cardiogenic shock: Secondary | ICD-10-CM | POA: Diagnosis not present

## 2024-05-25 DIAGNOSIS — I5082 Biventricular heart failure: Secondary | ICD-10-CM | POA: Diagnosis not present

## 2024-05-25 DIAGNOSIS — J9601 Acute respiratory failure with hypoxia: Secondary | ICD-10-CM | POA: Diagnosis not present

## 2024-05-25 DIAGNOSIS — I2102 ST elevation (STEMI) myocardial infarction involving left anterior descending coronary artery: Secondary | ICD-10-CM | POA: Diagnosis not present

## 2024-05-25 DIAGNOSIS — E11649 Type 2 diabetes mellitus with hypoglycemia without coma: Secondary | ICD-10-CM | POA: Diagnosis not present

## 2024-05-25 DIAGNOSIS — R29713 NIHSS score 13: Secondary | ICD-10-CM

## 2024-05-25 DIAGNOSIS — N183 Chronic kidney disease, stage 3 unspecified: Secondary | ICD-10-CM | POA: Diagnosis not present

## 2024-05-25 DIAGNOSIS — Z8673 Personal history of transient ischemic attack (TIA), and cerebral infarction without residual deficits: Secondary | ICD-10-CM | POA: Diagnosis not present

## 2024-05-25 LAB — CBC
HCT: 31 % — ABNORMAL LOW (ref 39.0–52.0)
HCT: 29.4 % — ABNORMAL LOW (ref 39.0–52.0)
HCT: 28.4 % — ABNORMAL LOW (ref 39.0–52.0)
Hemoglobin: 9.9 g/dL — ABNORMAL LOW (ref 13.0–17.0)
Hemoglobin: 9.3 g/dL — ABNORMAL LOW (ref 13.0–17.0)
Hemoglobin: 9.2 g/dL — ABNORMAL LOW (ref 13.0–17.0)
MCH: 27.3 pg (ref 26.0–34.0)
MCH: 27.9 pg (ref 26.0–34.0)
MCH: 28.3 pg (ref 26.0–34.0)
MCHC: 31.9 g/dL (ref 30.0–36.0)
MCHC: 31.6 g/dL (ref 30.0–36.0)
MCHC: 32.4 g/dL (ref 30.0–36.0)
MCV: 85.6 fL (ref 80.0–100.0)
MCV: 88.3 fL (ref 80.0–100.0)
MCV: 87.4 fL (ref 80.0–100.0)
Platelets: 110 K/uL — ABNORMAL LOW (ref 150–400)
Platelets: 105 K/uL — ABNORMAL LOW (ref 150–400)
Platelets: 104 K/uL — ABNORMAL LOW (ref 150–400)
RBC: 3.62 MIL/uL — ABNORMAL LOW (ref 4.22–5.81)
RBC: 3.33 MIL/uL — ABNORMAL LOW (ref 4.22–5.81)
RBC: 3.25 MIL/uL — ABNORMAL LOW (ref 4.22–5.81)
RDW: 15.2 % (ref 11.5–15.5)
RDW: 15.1 % (ref 11.5–15.5)
RDW: 15.1 % (ref 11.5–15.5)
WBC: 10.2 K/uL (ref 4.0–10.5)
WBC: 9.4 K/uL (ref 4.0–10.5)
WBC: 9.4 K/uL (ref 4.0–10.5)
nRBC: 0.4 % — ABNORMAL HIGH (ref 0.0–0.2)
nRBC: 0.3 % — ABNORMAL HIGH (ref 0.0–0.2)
nRBC: 0.3 % — ABNORMAL HIGH (ref 0.0–0.2)

## 2024-05-25 LAB — HEPATIC FUNCTION PANEL
ALT: 991 U/L — ABNORMAL HIGH (ref 0–44)
ALT: 821 U/L — ABNORMAL HIGH (ref 0–44)
AST: 1359 U/L — ABNORMAL HIGH (ref 15–41)
AST: 1007 U/L — ABNORMAL HIGH (ref 15–41)
Albumin: 2.7 g/dL — ABNORMAL LOW (ref 3.5–5.0)
Albumin: 2.5 g/dL — ABNORMAL LOW (ref 3.5–5.0)
Alkaline Phosphatase: 167 U/L — ABNORMAL HIGH (ref 38–126)
Alkaline Phosphatase: 208 U/L — ABNORMAL HIGH (ref 38–126)
Bilirubin, Direct: 0.9 mg/dL — ABNORMAL HIGH (ref 0.0–0.2)
Bilirubin, Direct: 1.1 mg/dL — ABNORMAL HIGH (ref 0.0–0.2)
Indirect Bilirubin: 0.4 mg/dL (ref 0.3–0.9)
Indirect Bilirubin: 0.5 mg/dL (ref 0.3–0.9)
Total Bilirubin: 1.3 mg/dL — ABNORMAL HIGH (ref 0.0–1.2)
Total Bilirubin: 1.6 mg/dL — ABNORMAL HIGH (ref 0.0–1.2)
Total Protein: 5.1 g/dL — ABNORMAL LOW (ref 6.5–8.1)
Total Protein: 4.9 g/dL — ABNORMAL LOW (ref 6.5–8.1)

## 2024-05-25 LAB — POCT I-STAT 7, (LYTES, BLD GAS, ICA,H+H)
Acid-Base Excess: 7 mmol/L — ABNORMAL HIGH (ref 0.0–2.0)
Acid-Base Excess: 6 mmol/L — ABNORMAL HIGH (ref 0.0–2.0)
Acid-Base Excess: 6 mmol/L — ABNORMAL HIGH (ref 0.0–2.0)
Acid-Base Excess: 5 mmol/L — ABNORMAL HIGH (ref 0.0–2.0)
Acid-Base Excess: 6 mmol/L — ABNORMAL HIGH (ref 0.0–2.0)
Bicarbonate: 31.5 mmol/L — ABNORMAL HIGH (ref 20.0–28.0)
Bicarbonate: 30.5 mmol/L — ABNORMAL HIGH (ref 20.0–28.0)
Bicarbonate: 27.8 mmol/L (ref 20.0–28.0)
Bicarbonate: 30.3 mmol/L — ABNORMAL HIGH (ref 20.0–28.0)
Bicarbonate: 28.3 mmol/L — ABNORMAL HIGH (ref 20.0–28.0)
Calcium, Ion: 1.04 mmol/L — ABNORMAL LOW (ref 1.15–1.40)
Calcium, Ion: 1.05 mmol/L — ABNORMAL LOW (ref 1.15–1.40)
Calcium, Ion: 1.02 mmol/L — ABNORMAL LOW (ref 1.15–1.40)
Calcium, Ion: 1.07 mmol/L — ABNORMAL LOW (ref 1.15–1.40)
Calcium, Ion: 1.04 mmol/L — ABNORMAL LOW (ref 1.15–1.40)
HCT: 29 % — ABNORMAL LOW (ref 39.0–52.0)
HCT: 29 % — ABNORMAL LOW (ref 39.0–52.0)
HCT: 26 % — ABNORMAL LOW (ref 39.0–52.0)
HCT: 26 % — ABNORMAL LOW (ref 39.0–52.0)
HCT: 26 % — ABNORMAL LOW (ref 39.0–52.0)
Hemoglobin: 9.9 g/dL — ABNORMAL LOW (ref 13.0–17.0)
Hemoglobin: 9.9 g/dL — ABNORMAL LOW (ref 13.0–17.0)
Hemoglobin: 8.8 g/dL — ABNORMAL LOW (ref 13.0–17.0)
Hemoglobin: 8.8 g/dL — ABNORMAL LOW (ref 13.0–17.0)
Hemoglobin: 8.8 g/dL — ABNORMAL LOW (ref 13.0–17.0)
O2 Saturation: 95 %
O2 Saturation: 98 %
O2 Saturation: 97 %
O2 Saturation: 97 %
O2 Saturation: 98 %
Patient temperature: 36.6
Patient temperature: 36.6
Patient temperature: 36.93
Patient temperature: 36.9
Patient temperature: 36.8
Potassium: 4.1 mmol/L (ref 3.5–5.1)
Potassium: 3.9 mmol/L (ref 3.5–5.1)
Potassium: 4.1 mmol/L (ref 3.5–5.1)
Potassium: 4.2 mmol/L (ref 3.5–5.1)
Potassium: 4.1 mmol/L (ref 3.5–5.1)
Sodium: 136 mmol/L (ref 135–145)
Sodium: 135 mmol/L (ref 135–145)
Sodium: 133 mmol/L — ABNORMAL LOW (ref 135–145)
Sodium: 135 mmol/L (ref 135–145)
Sodium: 134 mmol/L — ABNORMAL LOW (ref 135–145)
TCO2: 33 mmol/L — ABNORMAL HIGH (ref 22–32)
TCO2: 32 mmol/L (ref 22–32)
TCO2: 29 mmol/L (ref 22–32)
TCO2: 32 mmol/L (ref 22–32)
TCO2: 29 mmol/L (ref 22–32)
pCO2 arterial: 42.3 mmHg (ref 32–48)
pCO2 arterial: 41.8 mmHg (ref 32–48)
pCO2 arterial: 30.7 mmHg — ABNORMAL LOW (ref 32–48)
pCO2 arterial: 46.3 mmHg (ref 32–48)
pCO2 arterial: 32.2 mmHg (ref 32–48)
pH, Arterial: 7.479 — ABNORMAL HIGH (ref 7.35–7.45)
pH, Arterial: 7.469 — ABNORMAL HIGH (ref 7.35–7.45)
pH, Arterial: 7.565 — ABNORMAL HIGH (ref 7.35–7.45)
pH, Arterial: 7.424 (ref 7.35–7.45)
pH, Arterial: 7.552 — ABNORMAL HIGH (ref 7.35–7.45)
pO2, Arterial: 71 mmHg — ABNORMAL LOW (ref 83–108)
pO2, Arterial: 93 mmHg (ref 83–108)
pO2, Arterial: 80 mmHg — ABNORMAL LOW (ref 83–108)
pO2, Arterial: 92 mmHg (ref 83–108)
pO2, Arterial: 88 mmHg (ref 83–108)

## 2024-05-25 LAB — BASIC METABOLIC PANEL WITH GFR
Anion gap: 11 (ref 5–15)
Anion gap: 10 (ref 5–15)
Anion gap: 11 (ref 5–15)
BUN: 42 mg/dL — ABNORMAL HIGH (ref 8–23)
BUN: 41 mg/dL — ABNORMAL HIGH (ref 8–23)
BUN: 40 mg/dL — ABNORMAL HIGH (ref 8–23)
CO2: 31 mmol/L (ref 22–32)
CO2: 30 mmol/L (ref 22–32)
CO2: 29 mmol/L (ref 22–32)
Calcium: 8.1 mg/dL — ABNORMAL LOW (ref 8.9–10.3)
Calcium: 7.8 mg/dL — ABNORMAL LOW (ref 8.9–10.3)
Calcium: 7.9 mg/dL — ABNORMAL LOW (ref 8.9–10.3)
Chloride: 96 mmol/L — ABNORMAL LOW (ref 98–111)
Chloride: 95 mmol/L — ABNORMAL LOW (ref 98–111)
Chloride: 95 mmol/L — ABNORMAL LOW (ref 98–111)
Creatinine, Ser: 2.72 mg/dL — ABNORMAL HIGH (ref 0.61–1.24)
Creatinine, Ser: 2.47 mg/dL — ABNORMAL HIGH (ref 0.61–1.24)
Creatinine, Ser: 2.45 mg/dL — ABNORMAL HIGH (ref 0.61–1.24)
GFR, Estimated: 25 mL/min — ABNORMAL LOW
GFR, Estimated: 28 mL/min — ABNORMAL LOW
GFR, Estimated: 28 mL/min — ABNORMAL LOW
Glucose, Bld: 111 mg/dL — ABNORMAL HIGH (ref 70–99)
Glucose, Bld: 163 mg/dL — ABNORMAL HIGH (ref 70–99)
Glucose, Bld: 195 mg/dL — ABNORMAL HIGH (ref 70–99)
Potassium: 4.1 mmol/L (ref 3.5–5.1)
Potassium: 4.2 mmol/L (ref 3.5–5.1)
Potassium: 4.3 mmol/L (ref 3.5–5.1)
Sodium: 138 mmol/L (ref 135–145)
Sodium: 134 mmol/L — ABNORMAL LOW (ref 135–145)
Sodium: 135 mmol/L (ref 135–145)

## 2024-05-25 LAB — APTT
aPTT: 72 s — ABNORMAL HIGH (ref 24–36)
aPTT: 62 s — ABNORMAL HIGH (ref 24–36)
aPTT: 71 s — ABNORMAL HIGH (ref 24–36)

## 2024-05-25 LAB — GLUCOSE, CAPILLARY
Glucose-Capillary: 111 mg/dL — ABNORMAL HIGH (ref 70–99)
Glucose-Capillary: 114 mg/dL — ABNORMAL HIGH (ref 70–99)
Glucose-Capillary: 120 mg/dL — ABNORMAL HIGH (ref 70–99)
Glucose-Capillary: 133 mg/dL — ABNORMAL HIGH (ref 70–99)
Glucose-Capillary: 159 mg/dL — ABNORMAL HIGH (ref 70–99)
Glucose-Capillary: 168 mg/dL — ABNORMAL HIGH (ref 70–99)
Glucose-Capillary: 218 mg/dL — ABNORMAL HIGH (ref 70–99)
Glucose-Capillary: 72 mg/dL (ref 70–99)

## 2024-05-25 LAB — COOXEMETRY PANEL
Carboxyhemoglobin: 2.2 % — ABNORMAL HIGH (ref 0.5–1.5)
Carboxyhemoglobin: 2.1 % — ABNORMAL HIGH (ref 0.5–1.5)
Methemoglobin: 2.3 % — ABNORMAL HIGH (ref 0.0–1.5)
Methemoglobin: <0.7 % (ref 0.0–1.5)
O2 Saturation: 80 %
O2 Saturation: 75.7 %
Total hemoglobin: 9.2 g/dL — ABNORMAL LOW (ref 12.0–16.0)
Total hemoglobin: 9.3 g/dL — ABNORMAL LOW (ref 12.0–16.0)

## 2024-05-25 LAB — CG4 I-STAT (LACTIC ACID)
Lactic Acid, Venous: 0.5 mmol/L (ref 0.5–1.9)
Lactic Acid, Venous: 0.8 mmol/L (ref 0.5–1.9)

## 2024-05-25 LAB — MAGNESIUM: Magnesium: 1.9 mg/dL (ref 1.7–2.4)

## 2024-05-25 LAB — PROTIME-INR
INR: 1.9 — ABNORMAL HIGH (ref 0.8–1.2)
Prothrombin Time: 22.6 s — ABNORMAL HIGH (ref 11.4–15.2)

## 2024-05-25 LAB — CK: Total CK: 374 U/L (ref 49–397)

## 2024-05-25 LAB — LACTATE DEHYDROGENASE
LDH: 1687 U/L — ABNORMAL HIGH (ref 105–235)
LDH: 1392 U/L — ABNORMAL HIGH (ref 105–235)

## 2024-05-25 LAB — FIBRINOGEN: Fibrinogen: 746 mg/dL — ABNORMAL HIGH (ref 210–475)

## 2024-05-25 LAB — VANCOMYCIN, RANDOM: Vancomycin Rm: 12 ug/mL

## 2024-05-25 MED ORDER — VANCOMYCIN HCL 750 MG/150ML IV SOLN
750.0000 mg | INTRAVENOUS | Status: DC
  Administered 2024-05-25 – 2024-05-29 (×5): 750 mg via INTRAVENOUS
  Filled 2024-05-25 (×6): qty 150

## 2024-05-25 MED ORDER — MELATONIN 3 MG PO TABS
3.0000 mg | ORAL_TABLET | Freq: Every day | ORAL | Status: DC
  Administered 2024-05-25 – 2024-05-28 (×4): 3 mg
  Filled 2024-05-25 (×3): qty 1

## 2024-05-25 MED ORDER — CALCIUM CHLORIDE 10 % IV SOLN
INTRAVENOUS | Status: AC
  Filled 2024-05-25: qty 10

## 2024-05-25 MED ORDER — ALBUMIN HUMAN 5 % IV SOLN
INTRAVENOUS | Status: AC
  Filled 2024-05-25: qty 250

## 2024-05-25 MED ORDER — EPINEPHRINE 1 MG/10ML IV SOSY
PREFILLED_SYRINGE | INTRAVENOUS | Status: AC
  Filled 2024-05-25: qty 20

## 2024-05-25 MED ORDER — SODIUM BICARBONATE 8.4 % IV SOLN
INTRAVENOUS | Status: AC
  Filled 2024-05-25: qty 50

## 2024-05-25 MED ORDER — MELATONIN 3 MG PO TABS
3.0000 mg | ORAL_TABLET | Freq: Every day | ORAL | Status: DC
  Filled 2024-05-25: qty 1

## 2024-05-25 MED ORDER — LIDOCAINE 5 % EX PTCH
1.0000 | MEDICATED_PATCH | CUTANEOUS | Status: DC
  Administered 2024-05-25 – 2024-05-27 (×3): 1 via TRANSDERMAL
  Filled 2024-05-25 (×4): qty 1

## 2024-05-25 MED ORDER — LACTATED RINGERS IV BOLUS: 1500.0000 mL | Freq: Once | INTRAVENOUS | Status: DC

## 2024-05-25 MED ORDER — ACETAMINOPHEN 160 MG/5ML PO SOLN
650.0000 mg | Freq: Four times a day (QID) | ORAL | Status: DC | PRN
  Administered 2024-05-25 – 2024-05-26 (×2): 650 mg
  Filled 2024-05-25 (×3): qty 20.3

## 2024-05-25 MED ORDER — IOHEXOL 350 MG/ML SOLN
75.0000 mL | Freq: Once | INTRAVENOUS | Status: AC | PRN
  Administered 2024-05-25: 75 mL via INTRAVENOUS

## 2024-05-25 MED ORDER — STROKE: EARLY STAGES OF RECOVERY BOOK
Freq: Once | Status: AC
  Administered 2024-05-26: 1
  Filled 2024-05-25: qty 1

## 2024-05-25 NOTE — Progress Notes (Signed)
 "    Advanced Heart Failure Rounding Note  Cardiologist: None   [facility] Cardiologist: Dr. Rolan  Chief Complaint: Late presenting anterior MI, acute CHF Patient Profile   Philip Richardson is a 68 y.o. male with history of poorly controlled DM II, HTN, HLD and CKD IIIb. Admitted with late presenting anterior STEMI and acute systolic CHF.  Significant events:   12/18: LHC 100% p LAD treated with 3 overlapping DES, residual 90% p diagonal post PCI. LVEDP 24 mmHg. 12/19 IABP placed for progressive shock 12/20 Cannulated for VA ECMO   Subjective:    Remains on VA ECMO with IABP vent. Had severe SIRS response initially with VA ECMO but improved methylene blue  and adjustment of drips  Pressors off overnight. Feels good.   POCUS echo EF 20-25%  Back in NSR on IV amio    IABP 1:1   LA < 0.1 LDH and LFTs up   PAP: (14-34)/(9-28) 26/21 CVP:  [0 mmHg-63 mmHg] 9 mmHg CO:  [3.3 L/min-3.6 L/min] 3.3 L/min CI:  [1.52 L/min/m2-1.6 L/min/m2] 1.52 L/min/m2   Objective:    92.9 kg Body mass index is 27.02 kg/m.   Vital Signs:   Temp:  [97.3 F (36.3 C)-98.2 F (36.8 C)] 97.7 F (36.5 C) (12/22 1726) Pulse Rate:  [70-118] 92 (12/22 1726) Resp:  [0-29] 10 (12/22 1726) SpO2:  [82 %-100 %] 98 % (12/22 1726) Arterial Line BP: (113-229)/(22-89) 144/58 (12/22 1726) Weight:  [92.9 kg] 92.9 kg (12/22 0615) Last BM Date : 05/24/24  Weight change: Filed Weights   05/24/24 0600 05/24/24 1450 05/25/24 0615  Weight: 94.5 kg 94.5 kg 92.9 kg    Intake/Output:   Intake/Output Summary (Last 24 hours) at 05/25/2024 1731 Last data filed at 05/25/2024 1700 Gross per 24 hour  Intake 2502.88 ml  Output 3225 ml  Net -722.12 ml     Physical Exam   General:  Lying in bed. No resp difficulty HEENT: normal Neck: supple. RIJ swan Cor: Regular rate & rhythm. No rubs, gallops or murmurs. Lungs: clear Abdomen: soft, nontender, nondistended.Good bowel sounds. Extremities: no cyanosis, clubbing,  rash, 1+  edema ECMO cannulas and IABP ok  Neuro: alert & orientedx3, cranial nerves grossly intact. moves all 4 extremities w/o difficulty. Affect pleasant  Telemetry  Sinus 70-90 Personally reviewed  Labs   CBC Recent Labs    05/25/24 1356 05/25/24 1414 05/25/24 1636 05/25/24 1643  WBC 9.4  --  9.4  --   HGB 9.3*   < > 9.2* 8.8*  HCT 29.4*   < > 28.4* 26.0*  MCV 88.3  --  87.4  --   PLT 105*  --  104*  --    < > = values in this interval not displayed.   Basic Metabolic Panel Recent Labs    87/78/74 1700 05/24/24 1713 05/25/24 0427 05/25/24 0428 05/25/24 1356 05/25/24 1414 05/25/24 1643  NA 139   < > 138   < > 134* 133* 135  K 3.5   < > 4.1   < > 4.2 4.1 4.2  CL 94*  --  96*  --  95*  --   --   CO2 31  --  31  --  30  --   --   GLUCOSE 75  --  111*  --  163*  --   --   BUN 42*  --  42*  --  41*  --   --   CREATININE 2.88*  --  2.72*  --  2.47*  --   --   CALCIUM  8.1*  --  8.1*  --  7.8*  --   --   MG 2.0  --  1.9  --   --   --   --   PHOS 3.9  --   --   --   --   --   --    < > = values in this interval not displayed.   Liver Function Tests Recent Labs    05/25/24 0427 05/25/24 1356  AST 1,359* 1,007*  ALT 991* 821*  ALKPHOS 167* 208*  BILITOT 1.3* 1.6*  PROT 5.1* 4.9*  ALBUMIN  2.7* 2.5*   No results for input(s): LIPASE, AMYLASE in the last 72 hours. Cardiac Enzymes Recent Labs    05/25/24 1118  CKTOTAL 374    BNP: BNP (last 3 results) No results for input(s): BNP in the last 8760 hours.  ProBNP (last 3 results) Recent Labs    05/21/24 1855  PROBNP 18,891.0*     D-Dimer No results for input(s): DDIMER in the last 72 hours. Hemoglobin A1C No results for input(s): HGBA1C in the last 72 hours.  Fasting Lipid Panel No results for input(s): CHOL, HDL, LDLCALC, TRIG, CHOLHDL, LDLDIRECT in the last 72 hours.  Medications:   Scheduled Medications:  [START ON 05/26/2024]  stroke: early stages of recovery book   Does  not apply Once   (feeding supplement) PROSource Plus  30 mL Oral TID BM   aspirin   81 mg Per Tube Daily   Chlorhexidine  Gluconate Cloth  6 each Topical Q0600   clopidogrel   75 mg Per Tube Daily   lidocaine   1 patch Transdermal Q24H   melatonin  3 mg Oral QHS   metoCLOPramide  (REGLAN ) injection  5 mg Intravenous Q8H   multivitamin with minerals  1 tablet Oral Daily   pantoprazole  (PROTONIX ) IV  40 mg Intravenous QHS   rOPINIRole   0.25 mg Per Tube Daily   sodium chloride  flush  10-40 mL Intracatheter Q12H    Infusions:  sodium chloride      albumin  human Stopped (05/23/24 1130)   amiodarone  60 mg/hr (05/25/24 1700)   bivalirudin  (ANGIOMAX ) 250 mg in sodium chloride  0.9 % 500 mL (0.5 mg/mL) infusion Stopped (05/25/24 1219)   dextrose  Stopped (05/25/24 1608)   epinephrine  Stopped (05/24/24 2203)   meropenem  (MERREM ) IV Stopped (05/25/24 1009)   norepinephrine  (LEVOPHED ) Adult infusion Stopped (05/24/24 0217)   vancomycin  150 mL/hr at 05/25/24 1700   vasopressin  Stopped (05/24/24 0928)    PRN Medications: sodium chloride , acetaminophen  (TYLENOL ) oral liquid 160 mg/5 mL, acetaminophen , albumin  human, guaiFENesin -dextromethorphan , ondansetron  (ZOFRAN ) IV, mouth rinse, phenol  Assessment/Plan   1. CAD: Late-presenting anterior STEMI, symptoms began on 12/14.  Cath was done showing occluded LAD that was treated with overlapping DES x 3.  There was residual 80% D1. Symptoms had a pleuritic character pre-cath, and he continues to have significant pleuritic chest pain (not improved by PCI).  ECG post-cath showed persistent anterior STE.  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  No significant pericardial effusion present.  - Continue ASA 81 and ticagrelor .  - Continue Crestor  10 mg daily, has not been able to tolerate statins in the past so will start low dose.  - Continue colchicine  0.6 mg daily (renal dosing) given concern for post-infarct pericarditis, morphine  prn. Pain resolved.  Only small effusion on POCUS echo  - No s/s angina 2. Acute systolic CHF: Ischemic cardiomyopathy. SCAI stage D -  POCUS echo EF 25% - cMRI 30% + LV clot - Now on VA ECMO (12/20) with IABP vent. Pressors off - Lactate has cleared but LDH and LFTs climbing concerning for potential hemolysis. Circuit looks ok  - With poorly controlled DM2 and CKD likely not candidate for advanced therapies. Goal is to attempt to wean to recovery - IABP and ECMO parameters reviewed personally. See ECMO flowsheet. IABP now 1:1 as he is back in SR but with ? Hemolysis will decrease to 1:3 - Will wean ECMO to 3.5L and see how he tolerates 3. AKI on CKD stage 3b: Suspect diabetic nephropathy. Creatinine 2.2>2.7 (baseline appears to be low 2s) - Renal function improving with support Scr 3.22 -> 2.88 -> 2.47 - no need for CVVHD at this point. D/w Renal 4. AF with RVR - continue IV amio and bival - in NSR 4. Type 2 DM: Poor control with HgbA1c 13.1.   - sugars now low due to insulin  glargine - on D10 drip. Follow CBGs closely. D/w CCM and PharmD  - Eventually restart Farxiga .  5. LV thrombus on cMRI  - on bival  6. Elevated LFTs and LDH - concerning for hemolysis ECMO and IABP adjusted  CRITICAL CARE Performed by: Brooklyn Alfredo  Total critical care time: 50 minutes  Critical care time was exclusive of separately billable procedures and treating other patients.  Critical care was necessary to treat or prevent imminent or life-threatening deterioration.  Critical care was time spent personally by me (independent of midlevel providers or residents) on the following activities: development of treatment plan with patient and/or surrogate as well as nursing, discussions with consultants, evaluation of patient's response to treatment, examination of patient, obtaining history from patient or surrogate, ordering and performing treatments and interventions, ordering and review of laboratory studies, ordering and  review of radiographic studies, pulse oximetry and re-evaluation of patient's condition.   Length of Stay: 4  Toribio Fuel, MD  05/25/2024, 5:31 PM  Advanced Heart Failure Team Pager [phone] (M-F; 7a - 5p)   Please visit [email or web address]: For overnight coverage please call cardiology fellow first. If fellow not available call Shock/ECMO MD on call.  For ECMO / Mechanical Support (Impella, IABP, LVAD) issues call Shock / ECMO MD on call.      "

## 2024-05-25 NOTE — Progress Notes (Signed)
 Abd US  neg LFTs AST down trending  1359-->1007 ALT  991-->821 Alk Po3 167-->208 T bili 0.9-->1.1  Plan Cont to trend

## 2024-05-25 NOTE — Evaluation (Signed)
 Physical Therapy Evaluation Patient Details Name: Philip Richardson MRN: [id number] DOB: 1955/12/09 Today's Date: 05/25/2024  History of Present Illness  68 yo admitted 12/18 after presenting as code STEMI. Persistent pain and cardiogenic shock post cath . IABP placed;  cannulated for VA ECMO 12/20.  Probable LV thrombus. PMH:T2DM, GERD, HTN, HLD, CKD IIIb  Clinical Impression  Pt in bed upon arrival of PT, agreeable to evaluation at this time. Prior to admission the pt was completely independent without use of DME, reports living with his significant other in a home with 2 steps to enter. Full evaluation limited by presence of ecmo and IABP in bilateral femoral sheaths, pt on strict bedrest with no hip flexion. Therefore session focused on UE strength assessment and background info. Pt with good participation and strength in BUE, and good movement in bilateral ankles. Pt reports sensation intact, educated on bed-level exercises he is allowed to complete at this time. Will continue to follow acutely and make further recommendations for post-acute rehab and DME once pt is able to participate in OOB mobility.       If plan is discharge home, recommend the following: A lot of help with walking and/or transfers;A lot of help with bathing/dressing/bathroom;Help with stairs or ramp for entrance   Can travel by private vehicle        Equipment Recommendations Other (comment) (defer until OOB mobility allowed)  Recommendations for Other Services       Functional Status Assessment Patient has had a recent decline in their functional status and demonstrates the ability to make significant improvements in function in a reasonable and predictable amount of time.     Precautions / Restrictions Precautions Precautions: Other (comment) Recall of Precautions/Restrictions: Intact Precaution/Restrictions Comments: ecmo-B fem cannulas/IABP R - no B hip flexion; No OOB/HOB limited - maintain <30 Required  Braces or Orthoses: Knee Immobilizer - Right;Knee Immobilizer - Left Knee Immobilizer - Right: On at all times Knee Immobilizer - Left: On at all times Restrictions Weight Bearing Restrictions Per Provider Order: No      Mobility  Bed Mobility               General bed mobility comments: bedrest, not allowed to flex at hips, session focused on bed-level exercises for UE and some core          Pertinent Vitals/Pain Pain Assessment Pain Assessment: No/denies pain    Home Living Family/patient expects to be discharged to:: Private residence Living Arrangements: Spouse/significant other Available Help at Discharge: Family;Available 24 hours/day Type of Home: House Home Access: Stairs to enter Entrance Stairs-Rails: Left Entrance Stairs-Number of Steps: 2   Home Layout: One level Home Equipment: None Additional Comments: no glasses    Prior Function Prior Level of Function : Independent/Modified Independent;Working/employed;Driving             Mobility Comments: independent, no falls, bowls for activity ADLs Comments: independent, drives cars for auction; enjoys [employer]     Extremity/Trunk Assessment   Upper Extremity Assessment Upper Extremity Assessment: Defer to OT evaluation (mild incoordination in LUE compared to RUE)    Lower Extremity Assessment Lower Extremity Assessment:  (limited ability to assess due to limitations of bilateral femoral sheaths for ecmo and IABP, good strength at bilateral ankles and reports sensation intact.)    Cervical / Trunk Assessment Cervical / Trunk Assessment: Other exceptions Cervical / Trunk Exceptions: limited ROM of neck due to lines  Communication   Communication Communication: No apparent difficulties  Cognition Arousal: Alert Behavior During Therapy: WFL for tasks assessed/performed   PT - Cognitive impairments: Difficult to assess, No family/caregiver present to determine baseline Difficult to assess due  to:  (limited verbal responses)                     PT - Cognition Comments: pt with flat affect and limited verbal contributions. needing repeated cues to slow movements with poor ability to maintain corrections Following commands: Intact       Cueing Cueing Techniques: Verbal cues     General Comments General comments (skin integrity, edema, etc.): VSS in session    Exercises General Exercises - Upper Extremity Shoulder Flexion: Strengthening, Both, 10 reps (R lmited to 90 FF) Elbow Flexion: Strengthening, Right, 15 reps, Supine Elbow Extension: Strengthening, Right, 15 reps, Supine Digit Composite Flexion: Strengthening, Right, 10 reps General Exercises - Lower Extremity Ankle Circles/Pumps: AROM, Both, 10 reps Gluteal Sets: AROM, Both, 10 reps   Assessment/Plan    PT Assessment Patient needs continued PT services  PT Problem List Cardiopulmonary status limiting activity;Decreased strength;Decreased activity tolerance;Decreased balance;Decreased mobility       PT Treatment Interventions DME instruction;Gait training;Stair training;Functional mobility training;Therapeutic activities;Therapeutic exercise;Balance training    PT Goals (Current goals can be found in the Care Plan section)  Acute Rehab PT Goals Patient Stated Goal: to reutrn home PT Goal Formulation: With patient Time For Goal Achievement: 06/08/24 Potential to Achieve Goals: Good    Frequency Min 2X/week     Co-evaluation   Reason for Co-Treatment: Complexity of the patient's impairments (multi-system involvement) PT goals addressed during session: Mobility/safety with mobility;Strengthening/ROM OT goals addressed during session: ADL's and self-care       AM-PAC PT 6 Clicks Mobility  Outcome Measure Help needed turning from your back to your side while in a flat bed without using bedrails?: Total Help needed moving from lying on your back to sitting on the side of a flat bed without using  bedrails?: Total Help needed moving to and from a bed to a chair (including a wheelchair)?: Total Help needed standing up from a chair using your arms (e.g., wheelchair or bedside chair)?: Total Help needed to walk in hospital room?: Total Help needed climbing 3-5 steps with a railing? : Total 6 Click Score: 6    End of Session   Activity Tolerance: Patient tolerated treatment well Patient left: in bed Nurse Communication: Mobility status PT Visit Diagnosis: Muscle weakness (generalized) (M62.81)    Time: 9054-8996 PT Time Calculation (min) (ACUTE ONLY): 18 min   Charges:   PT Evaluation $PT Eval Moderate Complexity: 1 Mod   PT General Charges $$ ACUTE PT VISIT: 1 Visit         Izetta Call, PT, DPT   Acute Rehabilitation Department Office [phone] Secure Chat Communication Preferred  [name] Call 05/25/2024, 12:09 PM

## 2024-05-25 NOTE — Progress Notes (Signed)
 Called to room at 1220 urgently for sudden change in neuro exam Last seen normal at 1218  On my exam pt awake.  Sp garbled, expressive aphagia w/ right facial droop. UE strength equal.  Code stroke called Pt transported to CT scan  Stroke team at bedside emergently prior to transport and during transport CTangio  head obtained.  Negative for LVO.  CT head focal left caudate hypo attenuation  As we completed the CT scan w/ pt laying flat his speech has returned to almost baseline. He was oriented X3 and facial droop improved.   Impression  Non-LVO acute left stroke Seemingly perfusion dependent Plan Keep MAP > 75 HOB flat  Cont serial neuro checks Repeat cT head in am

## 2024-05-25 NOTE — Progress Notes (Signed)
 Occupational Therapy  Discussed with nsg who states pt did not have a stroke and cleared pt for exercise. Initiated grip and BUE strengthening with level 1 theraband. Pt states it feels good to do something. Will continue to follow and alternate days with PT to increase coverage with rehab.     05/25/24 1700  OT Visit Information  Last OT Received On 05/25/24  Assistance Needed +1  History of Present Illness 68 yo admitted 12/18 after presenting as code STEMI. Persistent pain and cardiogenic shock post cath . IABP placed;  cannulated for [facility]O 12/20.  Probable LV thrombus. PMH:T2DM, GERD, HTN, HLD, CKD IIIb  Precautions  Precautions Other (comment)  Recall of Precautions/Restrictions Intact  Precaution/Restrictions Comments ecmo-B fem cannulas/IABP R - no B hip flexion; No OOB/HOB limited - maintain <30  Required Braces or Orthoses Knee Immobilizer - Right;Knee Immobilizer - Left  Knee Immobilizer - Right On at all times  Knee Immobilizer - Left On at all times  Pain Assessment  Pain Assessment No/denies pain  Upper Extremity Assessment  Upper Extremity Assessment Generalized weakness  Bed Mobility  General bed mobility comments bedrest; OOB mobility not allowed  General Exercises - Upper Extremity  Shoulder Flexion Strengthening;Both;10 reps;Supine;Theraband  Theraband Level (Shoulder Flexion) Level 1 (Yellow)  Elbow Flexion Both;Strengthening;10 reps;Supine;Theraband  Theraband Level (Elbow Flexion) Level 1 (Yellow)  Elbow Extension Strengthening;Both;10 reps;Supine;Theraband  Theraband Level (Elbow Extension) Level 1 (Yellow)  Digit Composite Flexion Squeeze ball;Both;15 reps  OT - End of Session  Activity Tolerance Patient tolerated treatment well  Patient left in bed;with call bell/phone within reach;with family/visitor present;with nursing/sitter in room  Nurse Communication Other (comment) (encourage use of T band)  OT Assessment/Plan  OT Visit Diagnosis Muscle  weakness (generalized) (M62.81);Other abnormalities of gait and mobility (R26.89)  OT Frequency (ACUTE ONLY) Min 2X/week  Follow Up Recommendations Other (comment) (TBA)  Patient can return home with the following A little help with walking and/or transfers;A little help with bathing/dressing/bathroom;Assistance with cooking/housework;Assist for transportation  OT Equipment Other (comment)  AM-PAC OT 6 Clicks Daily Activity Outcome Measure (Version 2)  Help from another person eating meals? 3  Help from another person taking care of personal grooming? 3  Help from another person toileting, which includes using toliet, bedpan, or urinal? 1  Help from another person bathing (including washing, rinsing, drying)? 2  Help from another person to put on and taking off regular upper body clothing? 1  Help from another person to put on and taking off regular lower body clothing? 1  6 Click Score 11  Progressive Mobility  What is the highest level of mobility based on the mobility assessment? Level 1 (Bedfast) - Unable to balance while sitting on edge of bed  Mobility Referral No  Activity Turned to back - supine  OT Goal Progression  Progress towards OT goals Progressing toward goals  Acute Rehab OT Goals  Patient Stated Goal get better  OT Goal Formulation With patient  Time For Goal Achievement 06/08/24  Potential to Achieve Goals Good  ADL Goals  Pt Will Perform Grooming with set-up;bed level  Pt/caregiver will Perform Home Exercise Program Both right and left upper extremity;With theraband;With written HEP provided  OT Time Calculation  OT Start Time (ACUTE ONLY) 1636  OT Stop Time (ACUTE ONLY) 1650  OT Time Calculation (min) 14 min  OT General Charges  $OT Visit 1 Visit  OT Treatments  $Therapeutic Exercise 8-22 mins   [facility], OT/L   Acute  OT Clinical Specialist Acute Rehabilitation Services Pager [phone] Office [phone]

## 2024-05-25 NOTE — CV Procedure (Signed)
 ECMO DAILY NOTE:   Indication: Cardiogenic shock   Initial cannulation date: 05/23/24  ECMO day #3   ECMO type: [facility] ECMO   1) 25 FR multi-stage venous drainage in R CFV 2) 19 FR return cannula in L CFA     Daily data:   Flow 4.6 L RPM 3645 Sweep  0.5 L   Labs:   ABG    Component Value Date/Time   PHART 7.424 05/25/2024 1643   PCO2ART 46.3 05/25/2024 1643   PO2ART 92 05/25/2024 1643   HCO3 30.3 (H) 05/25/2024 1643   TCO2 32 05/25/2024 1643   ACIDBASEDEF 8.0 (H) 05/23/2024 0950   O2SAT 97 05/25/2024 1643    Hgb 10.2 Platelets 130k -> 110k LDH 893 -> 1687 PTT 72 Lactic acid 0.7   Plan:  Continue ECMO support with IABP vent. Will begin wean  Not Impella candidate with LV clot Goal for recovery    Toribio Fuel, MD  5:35 PM

## 2024-05-25 NOTE — Inpatient Diabetes Management (Signed)
 Inpatient Diabetes Program Recommendations  AACE/ADA: New Consensus Statement on Inpatient Glycemic Control (2015)  Target Ranges:  Prepandial:   less than 140 mg/dL      Peak postprandial:   less than 180 mg/dL (1-2 hours)      Critically ill patients:  140 - 180 mg/dL   Lab Results  Component Value Date   GLUCAP 168 (H) 05/25/2024   HGBA1C 13.1 (H) 05/21/2024    Review of Glycemic Control  Latest Reference Range & Units 05/24/24 20:13 05/25/24 00:05 05/25/24 00:08 05/25/24 04:26 05/25/24 08:09 05/25/24 11:54  Glucose-Capillary 70 - 99 mg/dL 896 (H) 72 888 (H) 879 (H) 133 (H) 168 (H)   Diabetes history: DM 2 Outpatient Diabetes medications:  Tresiba  50 units daily Amaryl  4 mg daily Farxiga  10 mg daily  Current orders for Inpatient glycemic control:  D10 50 ml/hr Diabetes Program Recommendations:    Currently no insulin  orders.  When appropriate (CBG's >150 mg/dL consistently), consider adding Novlog sensitive q 4 hours.   Thanks,  Randall Bullocks, RN, BC-ADM Inpatient Diabetes Coordinator Pager [phone]  (8a-5p)

## 2024-05-25 NOTE — Consult Note (Addendum)
 NEUROLOGY CONSULT NOTE   Date of service: May 25, 2024 Patient Name: Philip Richardson MRN:  [id number] DOB:  [birth year] Chief Complaint: Code Requesting Provider: Cherrie Toribio SAUNDERS, MD  History of Present Illness  Philip Richardson is a 68 y.o. male with hx of past medical history of poorly controlled T2DM, GERD, HTN, HLD, CKD IIIb admitted 12/18 after presenting as code STEMI.  Chest pain initially started on 12/14 and then reoccurred on 1218.  He was taken to Cath Lab and was found to have a LAD occlusion. Tx with DES x3 and IABP to due high LVEDP.  Overnight he had progressive shock and he was taken back to the Cath Lab and he was cannulated for [facility] ECMO.  Anticoagulated with Angiomax .  His pressor requirements improved, currently off of inotropes.  He developed garbled speech, confusion, gaze preference, and dysarthria acutely this afternoon and a code stroke was activated.  While laying flat in CT his neuroexam improved significantly.  CT head shows an indeterminant age left caudate head infarct, CTA appears to have some intracranial atherosclerosis, but no significant stenosis or occlusion.   LKW: 1217 Modified rankin score: 0-Completely asymptomatic and back to baseline post- stroke IV Thrombolysis: Full dose anticoagulation EVT: No, no LVO   NIHSS components Score: Comment  1a Level of Conscious 0[x]  1[]  2[]  3[]      1b LOC Questions 0[]  1[x]  2[]       1c LOC Commands 0[]  1[x]  2[]       2 Best Gaze 0[]  1[x]  2[]       3 Visual 0[]  1[]  2[]  3[]      4 Facial Palsy 0[]  1[x]  2[]  3[]      5a Motor Arm - left 0[x]  1[]  2[]  3[]  4[]  UN[]    5b Motor Arm - Right 0[x]  1[]  2[]  3[]  4[]  UN[]    6a Motor Leg - Left 0[]  1[]  2[]  3[x]  4[]  UN[]  Unable to elevate bilateral lower extremities due to ECMO cannula  6b Motor Leg - Right 0[]  1[]  2[]  3[x]  4[]  UN[]    7 Limb Ataxia 0[]  1[]  2[]  UN[]      8 Sensory 0[]  1[]  2[]  UN[]      9 Best Language 0[]  1[]  2[x]  3[]      10 Dysarthria 0[]  1[x]  2[]  UN[]      11  Extinct. and Inattention 0[]  1[]  2[]       TOTAL:13       ROS  Comprehensive ROS performed and pertinent positives documented in HPI   Past History   Past Medical History:  Diagnosis Date   Frequency of urination    GERD (gastroesophageal reflux disease)    Horseshoe kidney    BILATERAL   Hypertension    Renal calculus, bilateral    Type 2 diabetes mellitus (HCC)    Urgency of urination    Wears dentures     Past Surgical History:  Procedure Laterality Date   ARTERIAL LINE INSERTION N/A 05/22/2024   Procedure: ARTERIAL LINE INSERTION;  Surgeon: Cherrie Toribio SAUNDERS, MD;  Location: [facility] INVASIVE CV [facility];  Service: Cardiovascular;  Laterality: N/A;   BIOPSY  04/25/2020   Procedure: BIOPSY;  Surgeon: Cindie Carlin POUR, DO;  Location: [facility];  Service: Endoscopy;;  duodenum gastric esophagus   COLONOSCOPY WITH PROPOFOL  N/A 04/25/2020   internal hemorrhoids, one 5 mm polyp in descending colon. Tubular adenoma. 5 year surveillance.   CORONARY/GRAFT ACUTE MI REVASCULARIZATION N/A 05/21/2024   Procedure: Coronary/Graft Acute MI Revascularization;  Surgeon: Wonda Sharper, MD;  Location: [facility] INVASIVE CV LAB;  Service: Cardiovascular;  Laterality: N/A;   CORONARY/GRAFT ACUTE MI REVASCULARIZATION N/A 05/23/2024   Procedure: Coronary/Graft Acute MI Revascularization;  Surgeon: Cherrie Toribio SAUNDERS, MD;  Location: [facility];  Service: Cardiovascular;  Laterality: N/A;   CYSTOSCOPY W/ URETERAL STENT PLACEMENT Bilateral 12/09/2013   Procedure: CYSTOSCOPY WITH RETROGRADE PYELOGRAM/URETERAL STENT PLACEMENT;  Surgeon: Ricardo Likens, MD;  Location: [facility];  Service: Urology;  Laterality: Bilateral;   CYSTOSCOPY WITH RETROGRADE PYELOGRAM, URETEROSCOPY AND STENT PLACEMENT Bilateral 09/30/2013   Procedure: CYSTOSCOPY WITH BILATERAL RETROGRADE PYELOGRAM, LEFT DIAGNOSTIC URETEROSCOPY AND Left ureteral stent;  Surgeon: Ricardo Likens, MD;  Location: [facility];  Service:  Urology;  Laterality: Bilateral;   ECMO CANNULATION  05/23/2024   Procedure: ECMO CANNULATION;  Surgeon: Cherrie Toribio SAUNDERS, MD;  Location: [facility];  Service: Cardiovascular;;   ESOPHAGOGASTRODUODENOSCOPY (EGD) WITH PROPOFOL  N/A 04/25/2020   Mildly severe candida esophagitis without bleed, s/p biopsy. Suspicion for eosinophilic esophagitis but no increased eosinophils. Gastritis. Reactive gastropathy. Negative H.pylori.  +KOH prep.    IABP INSERTION N/A 05/22/2024   Procedure: IABP Insertion;  Surgeon: Cherrie Toribio SAUNDERS, MD;  Location: [facility];  Service: Cardiovascular;  Laterality: N/A;   LEFT HEART CATH AND CORONARY ANGIOGRAPHY N/A 05/21/2024   Procedure: LEFT HEART CATH AND CORONARY ANGIOGRAPHY;  Surgeon: Wonda Sharper, MD;  Location: [facility] INVASIVE CV LAB;  Service: Cardiovascular;  Laterality: N/A;   PERCUTANEOUS NEPHROLITHOTRIPSY  2005   POLYPECTOMY  04/25/2020   Procedure: POLYPECTOMY;  Surgeon: Cindie Carlin POUR, DO;  Location: [facility];  Service: Endoscopy;;  colon   RIGHT HEART CATH N/A 05/22/2024   Procedure: RIGHT HEART CATH;  Surgeon: Cherrie Toribio SAUNDERS, MD;  Location: [facility];  Service: Cardiovascular;  Laterality: N/A;   ROBOT ASSISTED PYELOPLASTY N/A 12/09/2013   Procedure: ROBOTIC ASSISTED BILATERAL PYELOLITHOTOMY, RIGHT  PYELOPLASTY ;  Surgeon: Ricardo Likens, MD;  Location: [facility];  Service: Urology;  Laterality: N/A;    Family History: Family History  Problem Relation Age of Onset   Cancer Mother    Colon cancer Neg Hx    Pancreatitis Neg Hx     Social History  reports that he has never smoked. He has never used smokeless tobacco. He reports that he does not drink alcohol and does not use drugs.  Allergies[1]  Medications  Current Medications[2]  Vitals   Vitals:   05/25/24 0630 05/25/24 0645 05/25/24 0700 05/25/24 0800  BP:      Pulse: 74 80 73   Resp: 16 17 20    Temp: 97.9 F (36.6 C) 97.9 F (36.6 C) 98.1 F  (36.7 C)   TempSrc:    Core  SpO2: 100% 100% 100%   Weight:      Height:        Body mass index is 27.02 kg/m.   Physical Exam   Constitutional: Critically ill Psych: Affect appropriate to situation.  Eyes: No scleral injection.  HENT: No OP obstruction.  Head: Normocephalic.  Cardiovascular: Normal rate and regular rhythm. Respiratory: Effort normal, non-labored breathing.  GI: Soft.  No distension. There is no tenderness. NG in place Skin: WDI.   Neurologic Examination   Neuro: Mental Status: Patient is awake, alert, states name, but speech is mostly garbled and dysarthric Unable to answer questions, unable to identify objects Cranial Nerves: II: Visual Fields are full. Pupils are equal, round, and reactive to light.   III,IV, VI: Incomplete gaze to the left V: Facial sensation is  symmetric to temperature VII: Facial movement is symmetric resting and smiling VIII: Hearing is intact to voice X: Palate elevates symmetrically XI: Shoulder shrug is symmetric. XII: Tongue protrudes midline without atrophy or fasciculations.  Motor: Tone is normal. Bulk is normal.  Lateral upper extremities with full strength.  Unable to elevate bilateral lower extremities due to ECMO cannulas.  Wiggles toes and plantar and dorsiflexion with full strength Sensory: Localizes to noxious stimuli in all extremities Cerebellar: FNF intact   POST CT EXAM Neuro: Mental Status: Patient is awake, alert, oriented to person, place, month, year, and situation. Speech is mildly dysarthric, able to identify objects Cranial Nerves: II: Visual Fields are full. Pupils are equal, round, and reactive to light.   III,IV, VI: EOMI without ptosis or diploplia.  V: Facial sensation is symmetric to temperature VII: Facial movement is symmetric resting and smiling VIII: Hearing is intact to voice X: Palate elevates symmetrically XI: Shoulder shrug is symmetric. XII: Tongue protrudes midline without  atrophy or fasciculations.  Motor: Tone is normal. Bulk is normal.  Lateral upper extremities with full strength.  Unable to elevate bilateral lower extremities due to ECMO cannulas.  Wiggles toes and plantar and dorsiflexion with full strength Sensory: Localizes to noxious stimuli in all extremities Cerebellar: FNF intact    Labs/Imaging/Neurodiagnostic studies   CBC:  Recent Labs  Lab Jun 15, 2024 0840 June 15, 2024 0841 05/24/24 1700 05/24/24 1713 05/25/24 0427 05/25/24 0428 05/25/24 0748  WBC 8.8   < > 11.9*  --  10.2  --   --   NEUTROABS 6.4  --   --   --   --   --   --   HGB 11.4*   < > 9.5*   < > 9.9* 9.9* 9.9*  HCT 34.1*   < > 29.0*   < > 31.0* 29.0* 29.0*  MCV 82.6   < > 85.5  --  85.6  --   --   PLT 203   < > 130*  --  110*  --   --    < > = values in this interval not displayed.   Basic Metabolic Panel:  Lab Results  Component Value Date   NA 135 05/25/2024   K 3.9 05/25/2024   CO2 31 05/25/2024   GLUCOSE 111 (H) 05/25/2024   BUN 42 (H) 05/25/2024   CREATININE 2.72 (H) 05/25/2024   CALCIUM  8.1 (L) 05/25/2024   GFRNONAA 25 (L) 05/25/2024   GFRAA 30 (L) 06/09/2020   Lipid Panel:  Lab Results  Component Value Date   LDLCALC 97 06/15/2024   HgbA1c:  Lab Results  Component Value Date   HGBA1C 13.1 (H) 06-15-24   INR  Lab Results  Component Value Date   INR 1.9 (H) 05/25/2024   APTT  Lab Results  Component Value Date   APTT 62 (H) 05/25/2024   AED levels: No results found for: PHENYTOIN, ZONISAMIDE, LAMOTRIGINE, LEVETIRACETA  CT Head without contrast(Personally reviewed): Focal hypoattenuation in the left caudate head, consistent with age indeterminate infarct. ASPECTS 9.  CT angio Head and Neck with contrast(Personally reviewed): 1. No large vessel occlusion, hemodynamically significant stenosis, or aneurysm in the head or neck. 2. Moderate bilateral pleural effusions.   ASSESSMENT   Philip Richardson is a 68 y.o. male with past medical  history of poorly controlled diabetes, GERD, HTN, HLD, CKD IIIb admitted 06-15-2024 after presenting as code STEMI.  He is currently on [place] ECMO.  Code stroke was activated for aphasia, dysarthria, confusion  that occurred acutely at 1217 this afternoon.  CT head shows an age-indeterminate infarct in the left caudate head, no large vessel occlusion on CTA.  Given that symptoms improved with patient laying flat suspect hypoperfusion/hypotension in the setting of ICAD while he had his HOB elevated. Recommend map goal 75 and HOB flat for 24 hours. CT head ordered for tomorrow.  RECOMMENDATIONS  - BP goal MAP of 75 - HOB flat - Repeat CT Scan at 24 hours to evaluate for stroke  - Consider MRI once able  - Stroke work up ordered - Stroke team to follow tomorrow   ______________________________________________________________________    Signed, Philip Last, NP [facility]  Patient evaluated and case discussed with nurse practitioner.  Patient developed mumbled speech acutely around 12:17 PM.  While getting CT scans his symptoms improved.  His head of bed was at an incline initially and his symptoms improved when he laid flat for CT scan.  I wonder if there is hypoperfusion component to his symptoms.  He is already on maximal secondary stroke prevention with anticoagulation for his ECMO. it is reasonable to repeat CT head tomorrow morning since he can't get an MRI, but it likely will not change management.   Philip Homans, MD Vascular Neurology      [1]  Allergies Allergen Reactions   Atorvastatin  Other (See Comments)    Myopathy/weakness   Invokana  Abbie.[email or web address] ] Other (See Comments)    weakness   Semaglutide  Other (See Comments)    Heartburn  [2]  Current Facility-Administered Medications:    (feeding supplement) PROSource Plus liquid 30 mL, 30 mL, Oral, TID BM, Claudene Toribio BROCKS, MD, 30 mL at 05/25/24 0931   0.9 %  sodium chloride  infusion (Manually program via Guardrails IV  Fluids), , Intravenous, Continuous, Bensimhon, Toribio SAUNDERS, MD   0.9 %  sodium chloride  infusion (Manually program via Guardrails IV Fluids), , Intravenous, PRN, Bensimhon, Daniel R, MD   acetaminophen  (TYLENOL ) tablet 650 mg, 650 mg, Oral, Q4H PRN, Bensimhon, Toribio SAUNDERS, MD   albumin  human 5 % solution 12.5 g, 12.5 g, Intravenous, PRN, Bensimhon, Toribio SAUNDERS, MD, Stopped at 05/23/24 1130   albumin  human 5 % solution, , , ,    amiodarone  (NEXTERONE  PREMIX) 360-4.14 MG/200ML-% (1.8 mg/mL) IV infusion, 60 mg/hr, Intravenous, Continuous, Bensimhon, Toribio SAUNDERS, MD, Richardson Rate: 33.3 mL/hr at 05/25/24 1000, 60 mg/hr at 05/25/24 1000   aspirin  chewable tablet 81 mg, 81 mg, Per Tube, Daily, Bitonti, Michael T, RPH, 81 mg at 05/25/24 0931   bivalirudin  (ANGIOMAX ) 250 mg in sodium chloride  0.9 % 500 mL (0.5 mg/mL) infusion, 0.027 mg/kg/hr (Order-Specific), Intravenous, Continuous, Laron Agent, RPH, Richardson Rate: 4.5 mL/hr at 05/25/24 1000, 0.0267 mg/kg/hr at 05/25/24 1000   calcium  chloride 10 % injection, , , ,    Chlorhexidine  Gluconate Cloth 2 % PADS 6 each, 6 each, Topical, Q0600, Bensimhon, Daniel R, MD, 6 each at 05/25/24 0932   clopidogrel  (PLAVIX ) tablet 75 mg, 75 mg, Per Tube, Daily, Bitonti, Michael T, RPH, 75 mg at 05/25/24 9068   dextrose  10 % infusion, , Intravenous, Continuous, Claudene Toribio BROCKS, MD, Richardson Rate: 50 mL/hr at 05/25/24 1000, Infusion Verify at 05/25/24 1000   EPINEPHrine  (ADRENALIN ) 1 MG/10ML injection, , , ,    EPINEPHrine  (ADRENALIN ) 10 mg in sodium chloride  0.9 % 250 mL (0.04 mg/mL) infusion, 0.5-30 mcg/min, Intravenous, Titrated, Claudene Toribio BROCKS, MD, Stopped at 05/24/24 2203   guaiFENesin -dextromethorphan  (ROBITUSSIN DM) 100-10 MG/5ML syrup 5 mL, 5 mL, Oral, Q4H PRN,  Bensimhon, Toribio SAUNDERS, MD, 5 mL at 05/24/24 0205   melatonin tablet 3 mg, 3 mg, Oral, QHS, Babcock, Peter E, NP   meropenem  (MERREM ) 500 mg in sodium chloride  0.9 % 100 mL IVPB, 500 mg, Intravenous, Q12H, Bitonti, Michael T, RPH,  Richardson Rate: 200 mL/hr at 05/25/24 1000, Infusion Verify at 05/25/24 1000   metoCLOPramide  (REGLAN ) injection 5 mg, 5 mg, Intravenous, Q8H, Claudene Toribio BROCKS, MD, 5 mg at 05/25/24 9383   multivitamin with minerals tablet 1 tablet, 1 tablet, Oral, Daily, Claudene Toribio BROCKS, MD, 1 tablet at 05/25/24 9068   norepinephrine  (LEVOPHED ) 16 mg in (0.064 mg/mL) premix infusion, 0-80 mcg/min, Intravenous, Titrated, Claudene Toribio BROCKS, MD, Stopped at 05/24/24 0217   ondansetron  (ZOFRAN ) injection 4 mg, 4 mg, Intravenous, Q6H PRN, Bensimhon, Daniel R, MD   Oral care mouth rinse, 15 mL, Mouth Rinse, PRN, Bensimhon, Toribio SAUNDERS, MD   pantoprazole  (PROTONIX ) injection 40 mg, 40 mg, Intravenous, QHS, Claudene Toribio BROCKS, MD, 40 mg at 05/24/24 2123   phenol (CHLORASEPTIC) mouth spray 1 spray, 1 spray, Mouth/Throat, PRN, Bensimhon, Toribio SAUNDERS, MD   rOPINIRole  (REQUIP ) tablet 0.25 mg, 0.25 mg, Per Tube, Daily, Bitonti, Michael T, RPH, 0.25 mg at 05/25/24 9068   rosuvastatin  (CRESTOR ) tablet 10 mg, 10 mg, Per Tube, Daily, Bitonti, Michael T, RPH, 10 mg at 05/25/24 9068   sodium bicarbonate  1 mEq/mL injection, , , ,    sodium chloride  flush (NS) 0.9 % injection 10-40 mL, 10-40 mL, Intracatheter, Q12H, Bensimhon, Toribio SAUNDERS, MD, 10 mL at 05/25/24 9057   vancomycin  variable dose per unstable renal function (pharmacist dosing), , Does not apply, See admin instructions, Cyndy Ozell DASEN [facility]   vasopressin  (PITRESSIN) 20 Units in 100 mL (0.2 unit/mL) infusion-*FOR SHOCK*, 0-0.06 Units/min, Intravenous, Continuous, Claudene Toribio BROCKS, MD, Stopped at 05/24/24 [phone]

## 2024-05-25 NOTE — Progress Notes (Signed)
 "  NAME:  Philip Richardson, MRN:  [id number], DOB:  May 07, 1956, LOS: 4 ADMISSION DATE:  05/21/2024, CONSULTATION DATE:  12/20 REFERRING MD:  Bensimhon, AHF CHIEF COMPLAINT:  cardiogenic shock    History of Present Illness:  68 year old male with past medical history of poorly controlled T2DM, GERD, HTN, HLD, CKD IIIb admitted 12/18 after presenting as code STEMI. Began having chest pain on Sunday (12/14) which resolved and then recurred.   Cath showed LAD occlusion tx with DES x 3 + IABP due to high LVEDP.  Persistent pain and cardiogenic shock post cath .  Overnight progressive shock so taken to cath lab.  IABP placed but no improvement so cannulated for VA ECMO.   PCCM consulted to assist with management.  Pertinent  Medical History  DM2 HTN HLD CKD  Significant Hospital Events: Including procedures, antibiotic start and stop dates in addition to other pertinent events   12/18-DES x 3 IABP 12/19 RHC: On NE 15, milrinone  0.375 and VP 0.02 Findings: Ao =81/58 (65) RA = 12 RV = 17/14 PA =  24/18 (20) PCW = 22 Fick cardiac output/index = 4.4/2.1 Thermo CO/CI =4.6/2.2 SVR = 770  Ao sat = 99% PA sat = 60%, 62% PAPi = 0.5 ECMO cannulation; not impella candidate d/t LV clot so using IABP to vent LV 12/21 Flow at 4.7lpm, sweep 0.6; lactate cleared. Bedside POCUS 20-25% CVP 7. Back in NSR. Pressor requirements much improvd. Only on epi. Had been on NE, VPA and methylene blue    Interim History / Subjective:  No complaints  Objective   Blood pressure 94/76, pulse 73, temperature 98.1 F (36.7 C), resp. rate 20, height 6' 1 (1.854 m), weight 92.9 kg, SpO2 100%. PAP: (14-30)/(10-25) 17/12 CVP:  [4 mmHg-25 mmHg] 5 mmHg      Intake/Output Summary (Last 24 hours) at 05/25/2024 0718 Last data filed at 05/25/2024 0700 Gross per 24 hour  Intake 2703.28 ml  Output 3850 ml  Net -1146.72 ml   Filed Weights   05/24/24 0600 05/24/24 1450 05/25/24 0615  Weight: 94.5 kg 94.5 kg 92.9 kg     Examination:  General critically ill 68  year old WM laying in bed no distress currently  HENT NCAT no JVD MMM. Right PAC cath dressing CD&I Pulm clear. Dec bases. Currently no accessory use on 4 lpm. Cough mechanics good. IS only about 500 ml Pcxr basilar atx/effusion Card sinus +IABP and ECMO Ext cool. CR slow. Doppler LE pulses. The bilateral femoral sites have old bloody drainage. The right femoral site is oozing serous fluid.  Abd soft + bowel sounds NGT has been unclamped. Now clamped Neuro awake and oriented currently. He moves all ext has no focal def Did get a little confused last night per nursing GU cl yellow via fc  ECMO ECMO Mode: VA ECMO Length: 47 ECMO Pump Hours: 47 ECMO Device: Cardiohelp ECMO Additional Device: Heater, NIRS Heater Temperature: 98.6 F (37 C) ECMO Gases Sweep Gas (LPM): 0.5 FiO2 (%): 100   Resolved Hospital Problem list   N/A  Assessment & Plan:  Late presenting STEMI s/p DES x 3 + IABP 12/19 complicated by post MI progressive cardiogenic shock VA ECMO cannulation fem/fem 05/23/24 w/ IABP vent -shock state stabilized w/ MCS. Off all pressors.  1:1 on IABP 4.6 l/flow on ECMO w/ 3644 RPM, pV-86; 265 Co-ox 80; lactate nml, LDH is rising: 893-->1687; fibrinogen  stable 701-->746 Current CVP 5-6; CI 1.64 Remains off pressors -not candidate for Impella d/t LV thrombus  Plan Cont tele  Cont ASA and plavix  ECMO per HF: current flow 4.6  IABP: 1:1 per HF.  Bival for AC goal PTT 50-70 Cont daily fibrinogen  & LDH Cont daily co-ox and hemodynamic monitoring via PAC Cont empiric meropenem  and vanc   AKI on CKD3 -slight improvement. 2.88-->2.72 -UOP Plan Cont to support MAP Strict I&O  Renal dose meds Serial labs  Mild delirium Currently more so at HS Plan Add melatonin   Acute hypoxemic respiratory failure- currently 4LPM [place] -O2 needs: 4 lpm -PCXR aeration about the same. Basilar vol loss effusion vs atx vs both Plan Encourage  IS Cont pulse ox [place] O2  On-going metabolic alkalosis  ? Mild contraction alk  Plan Monitor   ABLA expected post cannulation -hgb stable last 24hrs Plan Cont to trend cbc  Thrombocytopenia Stable 130-110 Plan Trend   Rising LDH and LFTs; c/w progressive hemolysis Plan Cont to trend LDH and LFTs Consider abd US    Probable LV thrombus- precludes impella Plan Cont AC via Bival   Poorly controlled DM (hypoglycemia 12/21) Plan Cont cbg q4 Holding SSI Cont D10    I personally  spent 42 minutes  on this patient which included: review of medical records, nursing notes, progress notes, evaluation, interpretation of lab data and diagnostic studies, taking independent history, performing exam, documenting plan, ordering diagnostics and interventions for the following critical care issues: Circulatory shock, Acute respiratory failure, Acute toxic and or metabolic encephalopathy, Severe metabolic derangements with the following interventions which included: prevention of further deterioration   "

## 2024-05-25 NOTE — Progress Notes (Signed)
 ANTICOAGULATION CONSULT NOTE  Pharmacy Consult for bivalirudin  Indication: LV thrombus + IABP + ECMO  Allergies[1]  Patient Measurements: Height: 6' 1 (185.4 cm) Weight: 94.5 kg (208 lb 5.4 oz) IBW/kg (Calculated) : 79.9 Heparin  Dosing Weight: 84 kg HEPARIN  DW (KG): 94.5  Vital Signs: Temp: 97.9 F (36.6 C) (12/22 0515) Temp Source: Core (12/22 0400) Pulse Rate: 73 (12/22 0515)  Labs: Recent Labs    05/22/24 1835 05/22/24 2240 05/23/24 0806 05/23/24 0809 05/23/24 0950 05/24/24 0428 05/24/24 0624 05/24/24 0954 05/24/24 1324 05/24/24 1700 05/24/24 1713 05/24/24 2015 05/25/24 0427 05/25/24 0428  HGB  --    < >  --  9.9*   < > 9.8*   < >  --    < > 9.5*   < > 9.5* 9.9* 9.9*  HCT  --    < >  --  29.7*   < > 28.9*   < >  --    < > 29.0*   < > 28.0* 31.0* 29.0*  PLT  --    < >  --  200   < > 129*  --   --   --  130*  --   --  110*  --   APTT  --   --   --  >200*   < > 38*  --  62*  --  69*  --   --  72*  --   LABPROT  --   --   --  26.6*  --  22.2*  --   --   --   --   --   --  22.6*  --   INR  --   --   --  2.3*  --  1.8*  --   --   --   --   --   --  1.9*  --   HEPARINUNFRC 0.47  --  0.72*  --   --   --   --   --   --   --   --   --   --   --   CREATININE  --    < >  --  3.79*   < > 3.22*  --   --   --  2.88*  --   --  2.72*  --    < > = values in this interval not displayed.    Estimated Creatinine Clearance: 29.4 mL/min (A) (by C-G formula based on SCr of 2.72 mg/dL (H)).  Medical History: Past Medical History:  Diagnosis Date   Frequency of urination    GERD (gastroesophageal reflux disease)    Horseshoe kidney    BILATERAL   Hypertension    Renal calculus, bilateral    Type 2 diabetes mellitus (HCC)    Urgency of urination    Wears dentures      Assessment: 66 yoM presents as late presenting STEMI s/p overlapping DES x3 to LAD with ischemic CMP (EF 40% TTE, 30% cMRI) with LV thrombus on cMRI.  Not on anticoagulation prior to admission.  Pharmacy  consulted for heparin  dosing. Pt s/p IABP placement and then VA-ECMO cannulation on 12/20. Pharmacy to transition to bivalirudin .  aPTT is slightly supra-therapeutic at 72 seconds on bivalirudin  drip rate 0.03mg /kg/hr. Some oozing at lines but no major s/sx bleeding. CBC low, stable  Goal of Therapy:  aPTT 50-70 seconds Monitor platelets by anticoagulation protocol: Yes   Plan:  Decrease Bivalirudin  0.027mg /kg/hr - dosing wt 84.2kg  aPTT in 4 hours Check aPTT q12h - 5a/5p   Lynwood Poplar, PharmD, BCPS Clinical Pharmacist 05/25/2024 5:46 AM     [1]  Allergies Allergen Reactions   Atorvastatin  Other (See Comments)    Myopathy/weakness   Invokana  [Canagliflozin ] Other (See Comments)    weakness   Semaglutide  Other (See Comments)    Heartburn

## 2024-05-25 NOTE — Progress Notes (Signed)
 ANTICOAGULATION CONSULT NOTE  Pharmacy Consult for bivalirudin  Indication: LV thrombus + IABP + ECMO  Allergies[1]  Patient Measurements: Height: 6' 1 (185.4 cm) Weight: 92.9 kg (204 lb 12.9 oz) IBW/kg (Calculated) : 79.9 Heparin  Dosing Weight: 84 kg HEPARIN  DW (KG): 94.5  Vital Signs: Temp: 98.1 F (36.7 C) (12/22 0700) Temp Source: Core (12/22 0800) Pulse Rate: 73 (12/22 0700)  Labs: Recent Labs    05/22/24 1835 05/22/24 2240 05/23/24 0806 05/23/24 0809 05/23/24 0950 05/24/24 0428 05/24/24 0624 05/24/24 1700 05/24/24 1713 05/25/24 0427 05/25/24 0428 05/25/24 0748 05/25/24 0942 05/25/24 1118  HGB  --    < >  --  9.9*   < > 9.8*   < > 9.5*   < > 9.9* 9.9* 9.9*  --   --   HCT  --    < >  --  29.7*   < > 28.9*   < > 29.0*   < > 31.0* 29.0* 29.0*  --   --   PLT  --    < >  --  200   < > 129*  --  130*  --  110*  --   --   --   --   APTT  --   --   --  >200*   < > 38*   < > 69*  --  72*  --   --  62*  --   LABPROT  --   --   --  26.6*  --  22.2*  --   --   --  22.6*  --   --   --   --   INR  --   --   --  2.3*  --  1.8*  --   --   --  1.9*  --   --   --   --   HEPARINUNFRC 0.47  --  0.72*  --   --   --   --   --   --   --   --   --   --   --   CREATININE  --    < >  --  3.79*   < > 3.22*  --  2.88*  --  2.72*  --   --   --   --   CKTOTAL  --   --   --   --   --   --   --   --   --   --   --   --   --  374   < > = values in this interval not displayed.    Estimated Creatinine Clearance: 29.4 mL/min (A) (by C-G formula based on SCr of 2.72 mg/dL (H)).  Medical History: Past Medical History:  Diagnosis Date   Frequency of urination    GERD (gastroesophageal reflux disease)    Horseshoe kidney    BILATERAL   Hypertension    Renal calculus, bilateral    Type 2 diabetes mellitus (HCC)    Urgency of urination    Wears dentures      Assessment: 72 yoM presents as late presenting STEMI s/p overlapping DES x3 to LAD with ischemic CMP (EF 40% TTE, 30% cMRI) with LV  thrombus on cMRI.  Not on anticoagulation prior to admission.  Pharmacy consulted for heparin  dosing. Pt s/p IABP placement and then VA-ECMO cannulation on 12/20. Pharmacy to transition to bivalirudin .  aPTT is within goal at 62 seconds on  bivalirudin  drip rate 0.027 mg/kg/hr. Some oozing at lines but no major s/sx bleeding. CBC low, stable  Goal of Therapy:  aPTT 50-70 seconds Monitor platelets by anticoagulation protocol: Yes   Plan:  Continue bivalirudin  0.027mg /kg/hr - dosing wt 84.2kg Check aPTT q12h - 5a/5p   Philip Richardson JONETTA ARABELLA, Philip Richardson Clinical Pharmacist  05/25/2024 1:11 PM   [facility] pharmacy phone numbers are listed on [email or web address]       [1]  Allergies Allergen Reactions   Atorvastatin  Other (See Comments)    Myopathy/weakness   Invokana  [Canagliflozin ] Other (See Comments)    weakness   Semaglutide  Other (See Comments)    Heartburn

## 2024-05-25 NOTE — Progress Notes (Signed)
 ANTICOAGULATION CONSULT NOTE  Pharmacy Consult for bivalirudin  Indication: LV thrombus + IABP + ECMO  Allergies[1]  Patient Measurements: Height: 6' 1 (185.4 cm) Weight: 92.9 kg (204 lb 12.9 oz) IBW/kg (Calculated) : 79.9 Heparin  Dosing Weight: 84 kg HEPARIN  DW (KG): 94.5  Vital Signs: Temp: 97.7 F (36.5 C) (12/22 1726) Temp Source: Core (12/22 0800) Pulse Rate: 92 (12/22 1726)  Labs: Recent Labs    05/22/24 1835 05/22/24 2240 05/23/24 0806 05/23/24 0809 05/23/24 0950 05/24/24 0428 05/24/24 0624 05/24/24 1700 05/24/24 1713 05/25/24 0427 05/25/24 0428 05/25/24 0942 05/25/24 1118 05/25/24 1356 05/25/24 1414 05/25/24 1636 05/25/24 1643  HGB  --    < >  --  9.9*   < > 9.8*   < > 9.5*   < > 9.9*   < >  --   --  9.3* 8.8* 9.2* 8.8*  HCT  --    < >  --  29.7*   < > 28.9*   < > 29.0*   < > 31.0*   < >  --   --  29.4* 26.0* 28.4* 26.0*  PLT  --    < >  --  200   < > 129*  --  130*  --  110*  --   --   --  105*  --  104*  --   APTT  --   --   --  >200*   < > 38*   < > 69*  --  72*  --  62*  --   --   --  71*  --   LABPROT  --   --   --  26.6*  --  22.2*  --   --   --  22.6*  --   --   --   --   --   --   --   INR  --   --   --  2.3*  --  1.8*  --   --   --  1.9*  --   --   --   --   --   --   --   HEPARINUNFRC 0.47  --  0.72*  --   --   --   --   --   --   --   --   --   --   --   --   --   --   CREATININE  --    < >  --  3.79*   < > 3.22*  --  2.88*  --  2.72*  --   --   --  2.47*  --   --   --   CKTOTAL  --   --   --   --   --   --   --   --   --   --   --   --  374  --   --   --   --    < > = values in this interval not displayed.    Estimated Creatinine Clearance: 32.3 mL/min (A) (by C-G formula based on SCr of 2.47 mg/dL (H)).  Medical History: Past Medical History:  Diagnosis Date   Frequency of urination    GERD (gastroesophageal reflux disease)    Horseshoe kidney    BILATERAL   Hypertension    Renal calculus, bilateral    Type 2 diabetes mellitus (HCC)     Urgency of urination  Wears dentures     Assessment: 27 yoM presents as late presenting STEMI s/p overlapping DES x3 to LAD with ischemic CMP (EF 40% TTE, 30% cMRI) with LV thrombus on cMRI.  Not on anticoagulation prior to admission.  Pharmacy consulted for heparin  dosing. Pt s/p IABP placement and then VA-ECMO cannulation on 12/20. Pharmacy to transition to bivalirudin .  aPTT just slightly above goal range (71 sec) on infusion at 0.027 mg/kg/hr. Some old oozing at lines but nothing major.  Goal of Therapy:  aPTT 50-70 seconds Monitor platelets by anticoagulation protocol: Yes   Plan:  Decrease bivalirudin  to 0.025mg /kg/hr - dosing wt 84.2kg Check aPTT q12h - 5a/5p  Vito Ralph, PharmD, BCPS Please see amion for complete clinical pharmacist phone list  05/25/2024 5:33 PM         [1]  Allergies Allergen Reactions   Atorvastatin  Other (See Comments)    Myopathy/weakness   Invokana  [Canagliflozin ] Other (See Comments)    weakness   Semaglutide  Other (See Comments)    Heartburn

## 2024-05-25 NOTE — Progress Notes (Signed)
 At 1217 family called this nurse into the room stating that patient was not making sense with his speech. Assessed patient and noticed that grips were strong and equal but speech was slurred and smile was asymmetrical. Called Jeralyn Banner NP to bedside at 1218 and then called a code stroke.

## 2024-05-25 NOTE — Progress Notes (Signed)
 " [facility] Progress Note    Assessment/ Plan:   AKI on CKD3b, nonoliguric -followed by Dr.Lateef OP. AKI likely secondary to ATN in the context of shock and contrast-induced injury.  No obstruction on u/s.  -Cr steadily improving with good UOP. Near baseline at this time so will sign off.   STEMI Cardiogenic shock -s/p PCI -with IABP and VA ECMO -no longer on pressors   AHRF -supplement per primary team   Alkalosis - Metabolic alkalosis with secondary respiratory alkalosis. Could consider acetazolamide  if needed but likely minimally impacting care at this time.   Anemia/ABLA -transfuse PRN for Hgb <7.   Hypocalcemia - Slightly low on ionized specimen.  Replete as needed   Discussed with CCM and ICU RN.  Subjective:   Patient feels well today with no complaints.  Good urine output.  Creatinine downtrending   Objective:   BP 94/76   Pulse 73   Temp 98.1 F (36.7 C)   Resp 20   Ht 6' 1 (1.854 m)   Wt 92.9 kg   SpO2 100%   BMI 27.02 kg/m   Intake/Output Summary (Last 24 hours) at 05/25/2024 9146 Last data filed at 05/25/2024 0700 Gross per 24 hour  Intake 2631.37 ml  Output 3600 ml  Net -968.63 ml   Weight change: 4.5 kg  Physical Exam: Gen: ill appearing, lying in bed, no distress CVS: Normal rate Resp: Bilateral chest rise with no increased work of breathing Abd: soft Ext: 1+ edema bl le's Neuro: awake, alert  Imaging: DG CHEST PORT 1 VIEW Result Date: 05/25/2024 CLINICAL DATA:  ECMO patient. EXAM: PORTABLE CHEST 1 VIEW COMPARISON:  05/24/2024 FINDINGS: The cardio pericardial silhouette is enlarged. Basilar atelectasis with small bilateral layering pleural effusion suspected. Right IJ pulmonary artery catheter tip is in the main pulmonary outflow tract, potentially just into the right main pulmonary artery. The radiopaque tip of intra-aortic balloon pump projects over the expected location of the transverse aorta. The NG tube passes  into the stomach although the distal tip position is not included on the film. Inferiorly placed ECMO cannula again noted. Right PICC line tip is obscured by the pulmonary artery catheter. IMPRESSION: 1. Support apparatus as described. 2. Similar basilar atelectasis with suspected small layering bilateral effusions. Electronically Signed   By: Camellia Candle M.D.   On: 05/25/2024 07:23   DG CHEST PORT 1 VIEW Result Date: 05/24/2024 EXAM: 1 VIEW(S) XRAY OF THE CHEST 05/24/2024 05:55:00 AM COMPARISON: 05/23/2024 CLINICAL HISTORY: Patient receiving ECMO FINDINGS: LINES, TUBES AND DEVICES: Intra-aortic balloon pump in place with tip in proximal descending thoracic aorta, unchanged. Inferior approach ECMO cannula in place with tip in right atrium. Right IJ Swan-Ganz catheter in place with tip in right main pulmonary artery. Nasogastric tube in place coursing below diaphragm with tip and side port beyond inferior image margin. Right PICC in place with tip in right atrium. LUNGS AND PLEURA: Mild bilateral interstitial thickening. Low lung volumes. Mild bibasilar atelectasis. Trace right pleural effusion. No pneumothorax. HEART AND MEDIASTINUM: Coronary artery stent in place. No acute abnormality of the cardiac and mediastinal silhouettes. BONES AND SOFT TISSUES: No acute osseous abnormality. IMPRESSION: 1. Intra-aortic balloon pump, ECMO cannula, Swan-Ganz catheter, nasogastric tube, and right PICC in place. 2. Mild bilateral interstitial thickening, low lung volumes, and mild bibasilar atelectasis. 3. Trace right pleural effusion. Electronically signed by: Waddell Calk MD 05/24/2024 07:53 AM EST RP Workstation: [id number]   US  RENAL Result Date: 05/23/2024 EXAM: US  Retroperitoneum Complete,  Renal. 05/23/2024 05:27:00 PM TECHNIQUE: Real-time ultrasonography of the retroperitoneum renal was performed. COMPARISON: US  Renal 05/31/2015. MRI abdomen 10/07/2019. CLINICAL HISTORY: AKI (acute kidney injury). FINDINGS:  Horseshoe kidney. RIGHT KIDNEY/URETER: Right kidney moiety measures 13.5 x 6.6 x 4.2 cm. Shadowing calculus in the right renal moiety measuring 1.0 mm. Mild prominence of the right renal pelvis. Increased cortical echogenicity. No mass. LEFT KIDNEY/URETER: Left kidney moiety measures 10.9 x 5.3 x 4.2 cm. Mild prominence of the left renal pelvis. Increased cortical echogenicity. No calculus. No mass. BLADDER: Decompressed bladder around a foley catheter. LIMITATIONS: Limited imaging due to body habitus and mental status. IMPRESSION: 1. Horseshoe kidney. 2. Increased cortical echogenicity compatible with medical renal disease. 3. 1.0 mm shadowing calculus in the right renal moiety. Electronically signed by: Norman Gatlin MD 05/23/2024 07:32 PM EST RP Workstation: [id number]   DG Abd 1 View Result Date: 05/23/2024 EXAM: XR ABDOMEN INTRAOPERATIVE COUNT 1 VIEW(S) XRAY OF THE ABDOMEN 05/23/2024 01:29:00 PM COMPARISON: None available. CLINICAL HISTORY: Encounter for nasogastric (NG) tube placement FINDINGS: No unexpected retained surgical items. Enteric tube in place with tip and side port terminating within the expected location of the distal stomach. ECMO catheter in place extending up to the inferior cavoatrial junction. Moderate colonic stool burden. IMPRESSION: 1. Enteric tube in place with tip and side port terminating within the expected location of the distal stomach. Electronically signed by: Waddell Calk MD 05/23/2024 01:41 PM EST RP Workstation: [id number]    Labs: BMET Recent Labs  Lab 05/22/24 1631 05/22/24 2240 05/23/24 0455 05/23/24 0510 05/23/24 0809 05/23/24 0950 05/23/24 1609 05/23/24 2355 05/24/24 9571 05/24/24 9375 05/24/24 1700 05/24/24 1713 05/24/24 1820 05/24/24 2015 05/25/24 0427 05/25/24 0428 05/25/24 0748  NA 126*   < > 127*   < > 131*   < > 135  133*   < > 140   < > 139 138 142 137 138 136 135  K 4.6   < > 4.3   < > 4.3   < > 3.7  3.5   < > 4.1   < > 3.5 3.3* 2.5*  3.3* 4.1 4.1 3.9  CL 89*  --  82*  --  81*  --  87*  --  94*  --  94*  --   --   --  96*  --   --   CO2 20*  --  14*  --  18*  --  31  --  32  --  31  --   --   --  31  --   --   GLUCOSE 225*  --  480*  --  594*  --  439*  --  54*  --  75  --   --   --  111*  --   --   BUN 43*  --  50*  --  48*  --  48*  --  48*  --  42*  --   --   --  42*  --   --   CREATININE 2.80*  --  3.76*  --  3.79*  --  3.58*  --  3.22*  --  2.88*  --   --   --  2.72*  --   --   CALCIUM  8.8*  --  7.9*  --  7.7*  --  8.5*  --  8.3*  --  8.1*  --   --   --  8.1*  --   --  PHOS  --   --   --   --   --   --   --   --   --   --  3.9  --   --   --   --   --   --    < > = values in this interval not displayed.   CBC Recent Labs  Lab 05/21/24 0840 05/21/24 0841 05/23/24 1609 05/23/24 2355 05/24/24 0428 05/24/24 0624 05/24/24 1700 05/24/24 1713 05/24/24 2015 05/25/24 0427 05/25/24 0428 05/25/24 0748  WBC 8.8   < > 8.9  --  11.0*  --  11.9*  --   --  10.2  --   --   NEUTROABS 6.4  --   --   --   --   --   --   --   --   --   --   --   HGB 11.4*   < > 9.9*  9.9*   < > 9.8*   < > 9.5*   < > 9.5* 9.9* 9.9* 9.9*  HCT 34.1*   < > 28.9*  29.0*   < > 28.9*   < > 29.0*   < > 28.0* 31.0* 29.0* 29.0*  MCV 82.6   < > 81.6  --  82.1  --  85.5  --   --  85.6  --   --   PLT 203   < > 152  --  129*  --  130*  --   --  110*  --   --    < > = values in this interval not displayed.    Medications:     (feeding supplement) PROSource Plus  30 mL Oral TID BM   aspirin   81 mg Per Tube Daily   Chlorhexidine  Gluconate Cloth  6 each Topical Q0600   clopidogrel   75 mg Per Tube Daily   metoCLOPramide  (REGLAN ) injection  5 mg Intravenous Q8H   multivitamin with minerals  1 tablet Oral Daily   pantoprazole  (PROTONIX ) IV  40 mg Intravenous QHS   rOPINIRole   0.25 mg Per Tube Daily   rosuvastatin   10 mg Per Tube Daily   sodium chloride  flush  10-40 mL Intracatheter Q12H   vancomycin  variable dose per unstable renal function (pharmacist  dosing)   Does not apply See admin instructions      "

## 2024-05-25 NOTE — Evaluation (Signed)
 Occupational Therapy Evaluation Patient Details Name: Philip Richardson MRN: [id number] DOB: [birth year] Today's Date: 05/25/2024   History of Present Illness   68 yo admitted 12/18 after presenting as code STEMI. Persistent pain and cardiogenic shock post cath . IABP placed;  cannulated for VA ECMO 12/20.  Probable LV thrombus. PMH:T2DM, GERD, HTN, HLD, CKD IIIb     Clinical Impressions PTA pt lives independently with his significant other and works driving cares for [employer]. Session limited due to restrictions below however pt with good strength BUE and able to participate in bed level tasks. Discussed with Jeralyn Banner (CCNP) and pt allowed to use T-band BUE and tasks which do not stress groin area. Will provide T-band HEP fro BUE to maintain strength while on ECMO/IABP. Will assess need for follow up OT once pt able to mobilize.      If plan is discharge home, recommend the following:   A little help with walking and/or transfers;A little help with bathing/dressing/bathroom;Assistance with cooking/housework;Assist for transportation     Functional Status Assessment   Patient has had a recent decline in their functional status and demonstrates the ability to make significant improvements in function in a reasonable and predictable amount of time.     Equipment Recommendations   Other (comment) (TBA)     Recommendations for Other Services         Precautions/Restrictions   Precautions Precautions: Other (comment) (ecmo-B fem cannulas/IABP R - no B hip flexion; No OOB/HOB limited  - maintain <30) Required Braces or Orthoses: Knee Immobilizer - Right;Knee Immobilizer - Left (maintain at all times)     Mobility Bed Mobility               General bed mobility comments: bedrest    Transfers                   General transfer comment: bedrest      Balance                                           ADL either  performed or assessed with clinical judgement   ADL Overall ADL's : Needs assistance/impaired                                       General ADL Comments: limited due to lines however ROM and strength is functional for set up for UB ADL tasks. Will further assess     Vision Baseline Vision/History: 0 No visual deficits       Perception         Praxis         Pertinent Vitals/Pain       Extremity/Trunk Assessment Upper Extremity Assessment Upper Extremity Assessment: Right hand dominant;Generalized weakness (but functional)   Lower Extremity Assessment Lower Extremity Assessment: Defer to PT evaluation   Cervical / Trunk Assessment Cervical / Trunk Assessment: Other exceptions (limited ROM of neck due to lines)   Communication Communication Communication: No apparent difficulties   Cognition Arousal: Alert Behavior During Therapy: WFL for tasks assessed/performed Cognition: No apparent impairments                               Following commands: Intact  Cueing  General Comments          Exercises Exercises: General Upper Extremity General Exercises - Upper Extremity Shoulder Flexion: Strengthening, Both, 10 reps (R lmited to 90 FF) Elbow Flexion: Strengthening, Right, 15 reps, Supine Elbow Extension: Strengthening, Right, 15 reps, Supine Digit Composite Flexion: Strengthening, Right, 10 reps   Shoulder Instructions      Home Living Family/patient expects to be discharged to:: Private residence Living Arrangements: Spouse/significant other Available Help at Discharge: Family;Available 24 hours/day Type of Home: House Home Access: Stairs to enter [employer] of Steps: 2 Entrance Stairs-Rails: Left Home Layout: One level     Bathroom Shower/Tub: Producer, Television/film/video: Standard Bathroom Accessibility: Yes   Home Equipment: None   Additional Comments: no glasses      Prior  Functioning/Environment Prior Level of Function : Independent/Modified Independent;Working/employed;Driving             Mobility Comments: independent, no falls, bowls for activity ADLs Comments: independent, drives cars for auction; enjoys [employer]    OT Problem List: Decreased strength;Decreased activity tolerance;Decreased knowledge of use of DME or AE;Decreased safety awareness;Cardiopulmonary status limiting activity   OT Treatment/Interventions: Self-care/ADL training;Therapeutic exercise;DME and/or AE instruction;Therapeutic activities;Patient/family education      OT Goals(Current goals can be found in the care plan section)   Acute Rehab OT Goals Patient Stated Goal: get better OT Goal Formulation: With patient Time For Goal Achievement: 06/08/24 Potential to Achieve Goals: Good   OT Frequency:  Min 2X/week    Co-evaluation PT/OT/SLP Co-Evaluation/Treatment: Yes Reason for Co-Treatment: Complexity of the patient's impairments (multi-system involvement)   OT goals addressed during session: ADL's and self-care      AM-PAC OT 6 Clicks Daily Activity     Outcome Measure Help from another person eating meals?: A Little Help from another person taking care of personal grooming?: A Little Help from another person toileting, which includes using toliet, bedpan, or urinal?: Total Help from another person bathing (including washing, rinsing, drying)?: A Lot Help from another person to put on and taking off regular upper body clothing?: Total Help from another person to put on and taking off regular lower body clothing?: Total 6 Click Score: 11   End of Session Nurse Communication: Mobility status;Other (comment) (allowable activity - T band is OK adn movemetn OK as long as it does not pull/stress groin area)  Activity Tolerance: Patient tolerated treatment well Patient left: in bed;with call bell/phone within reach  OT Visit Diagnosis: Muscle weakness (generalized)  (M62.81);Other abnormalities of gait and mobility (R26.89)                Time: 9054-8996 OT Time Calculation (min): 18 min Charges:  OT General Charges $OT Visit: 1 Visit OT Evaluation $OT Eval High Complexity: 1 High  Lacey Wallman, OT/L   Acute OT Clinical Specialist Acute Rehabilitation Services Pager [phone] Office [phone]   [facility] 05/25/2024, 10:38 AM

## 2024-05-25 NOTE — Progress Notes (Signed)
 Pharmacy Antibiotic Note  Philip Richardson is a 68 y.o. male admitted on 05/21/2024 with shock s/p VA ECMO cannulation. Pharmacy has been consulted for vancomycin  and meropenem  dosing.  Vancomycin  random ~24 hours after load is therapeutic at 12 mcg/ml. Cr has risen but appears to have plateued with some UOP.  Plan: Vancomycin  750mg  IV q 24hrs Meropenem  500g IV q12h Follow Cr for further dosing, likely will need VR in 24h pending Cr trend  Height: 6' 1 (185.4 cm) Weight: 92.9 kg (204 lb 12.9 oz) IBW/kg (Calculated) : 79.9  Temp (24hrs), Avg:98 F (36.7 C), Min:97.2 F (36.2 C), Max:98.2 F (36.8 C)  Recent Labs  Lab 05/23/24 0809 05/23/24 0817 05/23/24 1609 05/23/24 1613 05/23/24 2104 05/24/24 0428 05/24/24 0439 05/24/24 0923 05/24/24 0954 05/24/24 1700 05/25/24 0427 05/25/24 0554 05/25/24 1355 05/25/24 1356 05/25/24 1419  WBC 15.6*  --  8.9  --   --  11.0*  --   --   --  11.9* 10.2  --   --  9.4  --   CREATININE 3.79*  --  3.58*  --   --  3.22*  --   --   --  2.88* 2.72*  --   --   --   --   LATICACIDVEN  --    < >  --    < > 1.8  --  1.0 0.7  --   --   --  0.5  --   --  0.8  VANCORANDOM  --   --   --   --   --   --   --   --  15  --   --   --  12  --   --    < > = values in this interval not displayed.    Estimated Creatinine Clearance: 29.4 mL/min (A) (by C-G formula based on SCr of 2.72 mg/dL (H)).    Allergies[1]   Harlene Barlow, [employer], [facility] Clinical Pharmacist  05/25/2024 3:39 PM   [facility] pharmacy phone numbers are listed on [email or web address]       [1]  Allergies Allergen Reactions   Atorvastatin  Other (See Comments)    Myopathy/weakness   Invokana  [email or web address] ] Other (See Comments)    weakness   Semaglutide  Other (See Comments)    Heartburn

## 2024-05-25 NOTE — Code Documentation (Signed)
 Stroke Response Nurse Documentation Code Documentation  Philip Richardson is a 68 y.o. male presented 12/18 with STEMI now with IABP and VA ECMO. Today patient had sudden onset of garbled/dysarthric speech 1217 while CCM NP at bedside on 2H, Code Stroke activated.   Stroke team at bedside after activation, taken to CT with stroke team and ECMO specialists. NIH 8, see flowsheet for details. CT/CTA completed. No TNK d/t full dose anticoagulation. No LVO seen on imaging. Symptoms improved while lying flat for imaging, NIH 1. Plan: Vitals/NIH q2h, NPO until swallow eval, MAP >75, HOB flat. Bedside handoff with RN Nate.    Tonna Lacks K  Rapid Response RN

## 2024-05-26 ENCOUNTER — Encounter (HOSPITAL_COMMUNITY): Payer: Self-pay | Admitting: Anesthesiology

## 2024-05-26 ENCOUNTER — Inpatient Hospital Stay (HOSPITAL_COMMUNITY)

## 2024-05-26 ENCOUNTER — Encounter (HOSPITAL_COMMUNITY): Admission: EM | Disposition: E | Payer: Self-pay | Source: Home / Self Care | Attending: Internal Medicine

## 2024-05-26 DIAGNOSIS — N1832 Chronic kidney disease, stage 3b: Secondary | ICD-10-CM

## 2024-05-26 DIAGNOSIS — I213 ST elevation (STEMI) myocardial infarction of unspecified site: Secondary | ICD-10-CM | POA: Diagnosis not present

## 2024-05-26 DIAGNOSIS — R570 Cardiogenic shock: Secondary | ICD-10-CM

## 2024-05-26 DIAGNOSIS — N179 Acute kidney failure, unspecified: Secondary | ICD-10-CM

## 2024-05-26 DIAGNOSIS — I130 Hypertensive heart and chronic kidney disease with heart failure and stage 1 through stage 4 chronic kidney disease, or unspecified chronic kidney disease: Secondary | ICD-10-CM | POA: Diagnosis not present

## 2024-05-26 DIAGNOSIS — I214 Non-ST elevation (NSTEMI) myocardial infarction: Secondary | ICD-10-CM | POA: Diagnosis not present

## 2024-05-26 DIAGNOSIS — I2102 ST elevation (STEMI) myocardial infarction involving left anterior descending coronary artery: Secondary | ICD-10-CM | POA: Diagnosis not present

## 2024-05-26 DIAGNOSIS — Z955 Presence of coronary angioplasty implant and graft: Secondary | ICD-10-CM | POA: Diagnosis not present

## 2024-05-26 DIAGNOSIS — Z7189 Other specified counseling: Secondary | ICD-10-CM | POA: Diagnosis not present

## 2024-05-26 DIAGNOSIS — Z95811 Presence of heart assist device: Secondary | ICD-10-CM

## 2024-05-26 DIAGNOSIS — I959 Hypotension, unspecified: Secondary | ICD-10-CM | POA: Diagnosis not present

## 2024-05-26 DIAGNOSIS — J810 Acute pulmonary edema: Secondary | ICD-10-CM

## 2024-05-26 DIAGNOSIS — I4891 Unspecified atrial fibrillation: Secondary | ICD-10-CM

## 2024-05-26 DIAGNOSIS — Z515 Encounter for palliative care: Secondary | ICD-10-CM | POA: Diagnosis not present

## 2024-05-26 DIAGNOSIS — I5021 Acute systolic (congestive) heart failure: Secondary | ICD-10-CM

## 2024-05-26 DIAGNOSIS — I672 Cerebral atherosclerosis: Secondary | ICD-10-CM | POA: Diagnosis not present

## 2024-05-26 DIAGNOSIS — I5082 Biventricular heart failure: Secondary | ICD-10-CM | POA: Diagnosis not present

## 2024-05-26 DIAGNOSIS — I429 Cardiomyopathy, unspecified: Secondary | ICD-10-CM | POA: Diagnosis not present

## 2024-05-26 DIAGNOSIS — G9389 Other specified disorders of brain: Secondary | ICD-10-CM | POA: Diagnosis not present

## 2024-05-26 DIAGNOSIS — Z8673 Personal history of transient ischemic attack (TIA), and cerebral infarction without residual deficits: Secondary | ICD-10-CM

## 2024-05-26 DIAGNOSIS — I236 Thrombosis of atrium, auricular appendage, and ventricle as current complications following acute myocardial infarction: Secondary | ICD-10-CM

## 2024-05-26 LAB — HEPATIC FUNCTION PANEL
ALT: 592 U/L — ABNORMAL HIGH (ref 0–44)
AST: 553 U/L — ABNORMAL HIGH (ref 15–41)
Albumin: 2.5 g/dL — ABNORMAL LOW (ref 3.5–5.0)
Alkaline Phosphatase: 180 U/L — ABNORMAL HIGH (ref 38–126)
Bilirubin, Direct: 1.3 mg/dL — ABNORMAL HIGH (ref 0.0–0.2)
Indirect Bilirubin: 0.4 mg/dL (ref 0.3–0.9)
Total Bilirubin: 1.7 mg/dL — ABNORMAL HIGH (ref 0.0–1.2)
Total Protein: 4.9 g/dL — ABNORMAL LOW (ref 6.5–8.1)

## 2024-05-26 LAB — POCT I-STAT 7, (LYTES, BLD GAS, ICA,H+H)
Acid-Base Excess: 3 mmol/L — ABNORMAL HIGH (ref 0.0–2.0)
Acid-Base Excess: 4 mmol/L — ABNORMAL HIGH (ref 0.0–2.0)
Acid-Base Excess: 3 mmol/L — ABNORMAL HIGH (ref 0.0–2.0)
Acid-Base Excess: 0 mmol/L (ref 0.0–2.0)
Bicarbonate: 26.1 mmol/L (ref 20.0–28.0)
Bicarbonate: 27.1 mmol/L (ref 20.0–28.0)
Bicarbonate: 27.3 mmol/L (ref 20.0–28.0)
Bicarbonate: 24.4 mmol/L (ref 20.0–28.0)
Calcium, Ion: 1.04 mmol/L — ABNORMAL LOW (ref 1.15–1.40)
Calcium, Ion: 1.08 mmol/L — ABNORMAL LOW (ref 1.15–1.40)
Calcium, Ion: 1.04 mmol/L — ABNORMAL LOW (ref 1.15–1.40)
Calcium, Ion: 1.07 mmol/L — ABNORMAL LOW (ref 1.15–1.40)
HCT: 36 % — ABNORMAL LOW (ref 39.0–52.0)
HCT: 27 % — ABNORMAL LOW (ref 39.0–52.0)
HCT: 26 % — ABNORMAL LOW (ref 39.0–52.0)
HCT: 30 % — ABNORMAL LOW (ref 39.0–52.0)
Hemoglobin: 12.2 g/dL — ABNORMAL LOW (ref 13.0–17.0)
Hemoglobin: 9.2 g/dL — ABNORMAL LOW (ref 13.0–17.0)
Hemoglobin: 8.8 g/dL — ABNORMAL LOW (ref 13.0–17.0)
Hemoglobin: 10.2 g/dL — ABNORMAL LOW (ref 13.0–17.0)
O2 Saturation: 99 %
O2 Saturation: 96 %
O2 Saturation: 95 %
O2 Saturation: 97 %
Patient temperature: 36.7
Patient temperature: 36.5
Patient temperature: 36.8
Patient temperature: 36.6
Potassium: 3.7 mmol/L (ref 3.5–5.1)
Potassium: 3.9 mmol/L (ref 3.5–5.1)
Potassium: 3.8 mmol/L (ref 3.5–5.1)
Potassium: 3.8 mmol/L (ref 3.5–5.1)
Sodium: 134 mmol/L — ABNORMAL LOW (ref 135–145)
Sodium: 137 mmol/L (ref 135–145)
Sodium: 136 mmol/L (ref 135–145)
Sodium: 137 mmol/L (ref 135–145)
TCO2: 27 mmol/L (ref 22–32)
TCO2: 28 mmol/L (ref 22–32)
TCO2: 29 mmol/L (ref 22–32)
TCO2: 26 mmol/L (ref 22–32)
pCO2 arterial: 32.5 mmHg (ref 32–48)
pCO2 arterial: 35.4 mmHg (ref 32–48)
pCO2 arterial: 41 mmHg (ref 32–48)
pCO2 arterial: 36.2 mmHg (ref 32–48)
pH, Arterial: 7.511 — ABNORMAL HIGH (ref 7.35–7.45)
pH, Arterial: 7.491 — ABNORMAL HIGH (ref 7.35–7.45)
pH, Arterial: 7.431 (ref 7.35–7.45)
pH, Arterial: 7.435 (ref 7.35–7.45)
pO2, Arterial: 102 mmHg (ref 83–108)
pO2, Arterial: 76 mmHg — ABNORMAL LOW (ref 83–108)
pO2, Arterial: 71 mmHg — ABNORMAL LOW (ref 83–108)
pO2, Arterial: 83 mmHg (ref 83–108)

## 2024-05-26 LAB — TYPE AND SCREEN
ABO/RH(D): A POS
Antibody Screen: NEGATIVE
Unit division: 0
Unit division: 0
Unit division: 0
Unit division: 0
Unit division: 0
Unit division: 0
Unit division: 0
Unit division: 0
Unit division: 0
Unit division: 0

## 2024-05-26 LAB — FIBRINOGEN: Fibrinogen: >800 mg/dL — ABNORMAL HIGH (ref 210–475)

## 2024-05-26 LAB — BPAM RBC
Blood Product Expiration Date: 202601122359
Blood Product Expiration Date: 202601132359
Blood Product Expiration Date: 202601152359
Blood Product Expiration Date: 202601152359
Blood Product Expiration Date: 202601162359
Blood Product Expiration Date: 202601162359
Blood Product Expiration Date: 202601172359
Blood Product Expiration Date: 202601172359
Blood Product Expiration Date: 202601182359
Blood Product Expiration Date: 202601182359
ISSUE DATE / TIME: 202512200530
ISSUE DATE / TIME: 202512200530
ISSUE DATE / TIME: 202512221232
ISSUE DATE / TIME: 202512221232
ISSUE DATE / TIME: 202512221232
ISSUE DATE / TIME: 202512221232
Unit Type and Rh: 6200
Unit Type and Rh: 6200
Unit Type and Rh: 6200
Unit Type and Rh: 6200
Unit Type and Rh: 6200
Unit Type and Rh: 6200
Unit Type and Rh: 6200
Unit Type and Rh: 6200
Unit Type and Rh: 6200
Unit Type and Rh: 6200

## 2024-05-26 LAB — BASIC METABOLIC PANEL WITH GFR
Anion gap: 13 (ref 5–15)
Anion gap: 17 — ABNORMAL HIGH (ref 5–15)
BUN: 40 mg/dL — ABNORMAL HIGH (ref 8–23)
BUN: 44 mg/dL — ABNORMAL HIGH (ref 8–23)
CO2: 27 mmol/L (ref 22–32)
CO2: 24 mmol/L (ref 22–32)
Calcium: 8.3 mg/dL — ABNORMAL LOW (ref 8.9–10.3)
Calcium: 8.4 mg/dL — ABNORMAL LOW (ref 8.9–10.3)
Chloride: 97 mmol/L — ABNORMAL LOW (ref 98–111)
Chloride: 98 mmol/L (ref 98–111)
Creatinine, Ser: 2.42 mg/dL — ABNORMAL HIGH (ref 0.61–1.24)
Creatinine, Ser: 2.49 mg/dL — ABNORMAL HIGH (ref 0.61–1.24)
GFR, Estimated: 28 mL/min — ABNORMAL LOW
GFR, Estimated: 27 mL/min — ABNORMAL LOW
Glucose, Bld: 116 mg/dL — ABNORMAL HIGH (ref 70–99)
Glucose, Bld: 207 mg/dL — ABNORMAL HIGH (ref 70–99)
Potassium: 4 mmol/L (ref 3.5–5.1)
Potassium: 3.9 mmol/L (ref 3.5–5.1)
Sodium: 137 mmol/L (ref 135–145)
Sodium: 138 mmol/L (ref 135–145)

## 2024-05-26 LAB — LACTATE DEHYDROGENASE: LDH: 922 U/L — ABNORMAL HIGH (ref 105–235)

## 2024-05-26 LAB — APTT
aPTT: 75 s — ABNORMAL HIGH (ref 24–36)
aPTT: 68 s — ABNORMAL HIGH (ref 24–36)

## 2024-05-26 LAB — CBC
HCT: 29.7 % — ABNORMAL LOW (ref 39.0–52.0)
HCT: 28.4 % — ABNORMAL LOW (ref 39.0–52.0)
Hemoglobin: 9.4 g/dL — ABNORMAL LOW (ref 13.0–17.0)
Hemoglobin: 9.1 g/dL — ABNORMAL LOW (ref 13.0–17.0)
MCH: 27.6 pg (ref 26.0–34.0)
MCH: 27.2 pg (ref 26.0–34.0)
MCHC: 31.6 g/dL (ref 30.0–36.0)
MCHC: 32 g/dL (ref 30.0–36.0)
MCV: 87.1 fL (ref 80.0–100.0)
MCV: 85 fL (ref 80.0–100.0)
Platelets: 111 K/uL — ABNORMAL LOW (ref 150–400)
Platelets: 133 K/uL — ABNORMAL LOW (ref 150–400)
RBC: 3.41 MIL/uL — ABNORMAL LOW (ref 4.22–5.81)
RBC: 3.34 MIL/uL — ABNORMAL LOW (ref 4.22–5.81)
RDW: 15.1 % (ref 11.5–15.5)
RDW: 15.1 % (ref 11.5–15.5)
WBC: 8.5 K/uL (ref 4.0–10.5)
WBC: 12.5 K/uL — ABNORMAL HIGH (ref 4.0–10.5)
nRBC: 0.2 % (ref 0.0–0.2)
nRBC: 0.2 % (ref 0.0–0.2)

## 2024-05-26 LAB — GLUCOSE, CAPILLARY
Glucose-Capillary: 114 mg/dL — ABNORMAL HIGH (ref 70–99)
Glucose-Capillary: 201 mg/dL — ABNORMAL HIGH (ref 70–99)
Glucose-Capillary: 201 mg/dL — ABNORMAL HIGH (ref 70–99)
Glucose-Capillary: 206 mg/dL — ABNORMAL HIGH (ref 70–99)
Glucose-Capillary: 93 mg/dL (ref 70–99)

## 2024-05-26 LAB — PROTIME-INR
INR: 1.8 — ABNORMAL HIGH (ref 0.8–1.2)
Prothrombin Time: 22.1 s — ABNORMAL HIGH (ref 11.4–15.2)

## 2024-05-26 LAB — CORTISOL: Cortisol, Plasma: 19.9 ug/dL

## 2024-05-26 LAB — COOXEMETRY PANEL
Carboxyhemoglobin: 2.1 % — ABNORMAL HIGH (ref 0.5–1.5)
Carboxyhemoglobin: 1.9 % — ABNORMAL HIGH (ref 0.5–1.5)
Methemoglobin: 1 % (ref 0.0–1.5)
Methemoglobin: <0.7 % (ref 0.0–1.5)
O2 Saturation: 87.9 %
O2 Saturation: 85.2 %
Total hemoglobin: 8.8 g/dL — ABNORMAL LOW (ref 12.0–16.0)
Total hemoglobin: 9.2 g/dL — ABNORMAL LOW (ref 12.0–16.0)

## 2024-05-26 LAB — CG4 I-STAT (LACTIC ACID)
Lactic Acid, Venous: 0.7 mmol/L (ref 0.5–1.9)
Lactic Acid, Venous: 0.7 mmol/L (ref 0.5–1.9)
Lactic Acid, Venous: 1 mmol/L (ref 0.5–1.9)

## 2024-05-26 LAB — MAGNESIUM: Magnesium: 1.9 mg/dL (ref 1.7–2.4)

## 2024-05-26 LAB — PHOSPHORUS: Phosphorus: 3.2 mg/dL (ref 2.5–4.6)

## 2024-05-26 SURGERY — DECANNULATION FOR  ECMO (EXTRACORPOREAL MEMBRANE OXYGENATION)
Anesthesia: General

## 2024-05-26 MED ORDER — MILRINONE LACTATE IN DEXTROSE 20-5 MG/100ML-% IV SOLN
0.1250 ug/kg/min | INTRAVENOUS | Status: DC
  Administered 2024-05-26: 0.125 ug/kg/min via INTRAVENOUS

## 2024-05-26 MED ORDER — MILRINONE LACTATE IN DEXTROSE 20-5 MG/100ML-% IV SOLN
INTRAVENOUS | Status: AC
  Filled 2024-05-26: qty 100

## 2024-05-26 MED ORDER — ALBUMIN HUMAN 5 % IV SOLN
12.5000 g | Freq: Once | INTRAVENOUS | Status: AC
  Administered 2024-05-26: 12.5 g via INTRAVENOUS

## 2024-05-26 MED ORDER — QUETIAPINE FUMARATE 50 MG PO TABS
50.0000 mg | ORAL_TABLET | Freq: Two times a day (BID) | ORAL | Status: DC
  Administered 2024-05-26 – 2024-05-30 (×7): 50 mg
  Filled 2024-05-26 (×7): qty 1

## 2024-05-26 MED ORDER — DOBUTAMINE-DEXTROSE 4-5 MG/ML-% IV SOLN
2.5000 ug/kg/min | INTRAVENOUS | Status: DC
  Administered 2024-05-26: 2.5 ug/kg/min via INTRAVENOUS
  Filled 2024-05-26: qty 250

## 2024-05-26 MED ORDER — DEXMEDETOMIDINE HCL IN NACL 400 MCG/100ML IV SOLN
0.0000 ug/kg/h | INTRAVENOUS | Status: DC
  Administered 2024-05-26: 0.2 ug/kg/h via INTRAVENOUS
  Administered 2024-05-26 – 2024-05-27 (×5): 0.6 ug/kg/h via INTRAVENOUS
  Administered 2024-05-28 (×2): 0.8 ug/kg/h via INTRAVENOUS
  Administered 2024-05-28 – 2024-05-29 (×2): 0.4 ug/kg/h via INTRAVENOUS
  Administered 2024-05-29: 0.9 ug/kg/h via INTRAVENOUS
  Administered 2024-05-29: 0.4 ug/kg/h via INTRAVENOUS
  Administered 2024-05-30: 1 ug/kg/h via INTRAVENOUS
  Filled 2024-05-26 (×3): qty 100
  Filled 2024-05-26: qty 200
  Filled 2024-05-26 (×7): qty 100

## 2024-05-26 MED ORDER — FUROSEMIDE 10 MG/ML IJ SOLN
80.0000 mg | Freq: Once | INTRAMUSCULAR | Status: AC
  Administered 2024-05-26: 80 mg via INTRAVENOUS
  Filled 2024-05-26: qty 8

## 2024-05-26 NOTE — Progress Notes (Signed)
 Due to increased requirement for inotropes/vasopressers to support BP and CO/CI, ECMO decannulation cancelled per Dr. Bensimhon. Vascular, OR and CCM teams notified of change.

## 2024-05-26 NOTE — Progress Notes (Signed)
 ANTICOAGULATION CONSULT NOTE  Pharmacy Consult for bivalirudin  Indication: LV thrombus + IABP + ECMO  Allergies[1]  Patient Measurements: Height: 6' 1 (185.4 cm) Weight: 94.1 kg (207 lb 7.3 oz) IBW/kg (Calculated) : 79.9 Heparin  Dosing Weight: 84 kg HEPARIN  DW (KG): 94.5  Vital Signs: Temp: 97.7 F (36.5 C) (12/23 0700) Temp Source: Core (12/23 0400) Pulse Rate: 111 (12/23 0700)  Labs: Recent Labs    05/23/24 0806 05/23/24 0809 05/24/24 0428 05/24/24 0624 05/25/24 0427 05/25/24 0428 05/25/24 0942 05/25/24 1118 05/25/24 1356 05/25/24 1414 05/25/24 1636 05/25/24 1643 05/26/24 0405 05/26/24 0519 05/26/24 0535 05/26/24 0722  HGB  --    < > 9.8*   < > 9.9*   < >  --   --  9.3*   < > 9.2*   < > 9.4*  --  12.2* 9.2*  HCT  --    < > 28.9*   < > 31.0*   < >  --   --  29.4*   < > 28.4*   < > 29.7*  --  36.0* 27.0*  PLT  --    < > 129*   < > 110*  --   --   --  105*  --  104*  --  111*  --   --   --   APTT  --    < > 38*   < > 72*  --  62*  --   --   --  71*  --   --  75*  --   --   LABPROT  --    < > 22.2*  --  22.6*  --   --   --   --   --   --   --   --  22.1*  --   --   INR  --    < > 1.8*  --  1.9*  --   --   --   --   --   --   --   --  1.8*  --   --   HEPARINUNFRC 0.72*  --   --   --   --   --   --   --   --   --   --   --   --   --   --   --   CREATININE  --    < > 3.22*   < > 2.72*  --   --   --  2.47*  --  2.45*  --  2.42*  --   --   --   CKTOTAL  --   --   --   --   --   --   --  374  --   --   --   --   --   --   --   --    < > = values in this interval not displayed.    Estimated Creatinine Clearance: 33 mL/min (A) (by C-G formula based on SCr of 2.42 mg/dL (H)).  Medical History: Past Medical History:  Diagnosis Date   Frequency of urination    GERD (gastroesophageal reflux disease)    Horseshoe kidney    BILATERAL   Hypertension    Renal calculus, bilateral    Type 2 diabetes mellitus (HCC)    Urgency of urination    Wears dentures      Assessment: 61 yoM presents as late presenting STEMI s/p  overlapping DES x3 to LAD with ischemic CMP (EF 40% TTE, 30% cMRI) with LV thrombus on cMRI.  Not on anticoagulation prior to admission.  Pharmacy consulted for heparin  dosing. Pt s/p IABP placement and then VA-ECMO cannulation on 12/20. Pharmacy to transition to bivalirudin .  aPTT just slightly above goal range (75 sec) on infusion at 0.025 mg/kg/hr.  CBC stable, no overt bleeding or complications noted.  Goal of Therapy:  aPTT 50-70 seconds Monitor platelets by anticoagulation protocol: Yes   Plan:  Decrease bivalirudin  to 0.02 mg/kg/hr - dosing wt 84.2kg Check aPTT q12h - 5a/5p  Harlene Denna Berdine [employer], [facility] Clinical Pharmacist  05/26/2024 7:28 AM   [facility] pharmacy phone numbers are listed on [email or web address]          [1]  Allergies Allergen Reactions   Atorvastatin  Other (See Comments)    Myopathy/weakness   Invokana  [Canagliflozin ] Other (See Comments)    weakness   Semaglutide  Other (See Comments)    Heartburn

## 2024-05-26 NOTE — Progress Notes (Signed)
 Nutrition Follow-up  DOCUMENTATION CODES:   Not applicable  INTERVENTION:   Recommend Cortrak placement tomorrow (Wed 12/24) with initiation of enteral nutrition. NPO/CL day 5. Pt with acute catabolic illness with increased nutritional needs, in particular protein needs. Discussed with PCCM who agrees with plan.  Pt with NG in place currently but not appropriate for initiation of TF today given worsening hemodynamic instability If unable to begin TF or pt unable to tolerate within 48 hours, recommend considering TPN  Reverse trendelenburg <10 degrees once TF initiated until able to maintain HOB >30 degrees. Femoral cannulation currently, IABP  Tube Feeding Recommendations: Recommend trickle TF upon initiation Vital 1.5 at 20 ml/hr with goal rate of 60 ml/hr Pro-Source TF20 60 mL BID TF provides 137 g of protein, 2320 kcals and 1094 mL of free water   NUTRITION DIAGNOSIS:   Increased nutrient needs related to catabolic illness, acute illness as evidenced by estimated needs.  Continues   GOAL:   Patient will meet greater than or equal to 90% of their needs  Not Met  MONITOR:   PO intake, Diet advancement, Supplement acceptance, Labs, I & O's, Weight trends  REASON FOR ASSESSMENT:   Consult, Ventilator Assessment of nutrition requirement/status (ECMO)  ASSESSMENT:   68 yo male admitted with anterior STEMI s/p DES x 3 and cardiogenic shock with acute systolic HF requiring VA ECMO with IABP vent,  AKI on CKD 3. PMH includes DM, HTN, HLD, CKD 3  12/18 Admitted 12/19 IABP 12/20 Worsening shock with increasing pressor requirements, VA ECMO cannulation 12/22 Code Stroke yesterday, CT ok per MD  Noted initial plan for VA ECMO decannulation today (fem-fem access), IABP to remain in place.  DBA started this AM, subsequent hypotension, levophed  restarted, up to 20, epinephrine  started, DBA held. Due to increased vasopressor/inotrope requirements, decannulation cancelled for  today +LV thrombus, currently on Bival  This AM, pt very confused. Code Stroke yesterday, CT ok, plan for repeat CT today  NPO this AM, NPO/CL day 5. NG tube in place. Minimal output currently  Girlfriend at bedside and provides some history. She reports pt's appetite is good at baseline and had been good up until he had coughing spells preventing him from eating. Pt is a picky eater; pt will only eat if it is something he likes.   Typical 24 hour recall:  Breakfast: (best meal of the day) Eggs, bacon with waffle/pancakes or [employer] Lunch: snacks on chips Dinner: Usually a veggie plate (consists of combination of pinto beans, greens, okra, sweet potato casseroles, etc); sometimes will eat popcorn chicken, country style steak etc but does not always eat meat at dinner. Sometimes cream of chicken soup Usually drinks water , unsweet tea or diet soda (likes Sprite zero)-used to drink a lot of regular soda  Pt with recent wt around 186 pounds, previously weighing 195 pounds. Unintentional wt loss, related to the coughing spells per girlfriend.   Current wt: 94.1 kg (208 pounds) Admit Wt: 84.2 kg (186 pounds) on 12/19, 90 kg (198 pounds) on 12/20  UOP 2.9 L in 24 hours, Creatinine slowly trending down  Hx of DM, HgbA1c 13.1 CBGs  Elevated LFTs, T.Bili 1.7  Labs: Sodium 137 (wdl) Potassium 4.0 (wdl) BUN 40 Creatinine 2.42  Phosphorus 3.2 (wdl) Magnesium  1.9 (Wdl)  Meds: Plavix   Lasix  x 1 IV Reglan  MVI with Minerals  NUTRITION - FOCUSED PHYSICAL EXAM:  Deferred per RN request, pt worsening hemodynamics, confused and agitated this AM.   Diet Order:   Diet Order  Diet NPO time specified  Diet effective now                   EDUCATION NEEDS:   Not appropriate for education at this time  Skin:  Skin Assessment: Reviewed RN Assessment  Last BM:  12/21  Height:   Ht Readings from Last 1 Encounters:  05/24/24 6' 1 (1.854 m)    Weight:   Wt  Readings from Last 1 Encounters:  05/26/24 94.1 kg    BMI:  Body mass index is 27.37 kg/m.  Estimated Nutritional Needs:   Kcal:  2100-2400 kcals  Protein:  130-160 g  Fluid:  1.8L   Betsey Finger MS, RDN, LDN, CNSC Registered Dietitian 3 Clinical Nutrition RD Inpatient Contact Info in Amion

## 2024-05-26 NOTE — Progress Notes (Signed)
 "  NAME:  Philip Richardson, MRN:  [id number], DOB:  September 09, 1955, LOS: 5 ADMISSION DATE:  05/21/2024, CONSULTATION DATE:  12/20 REFERRING MD:  Bensimhon, AHF CHIEF COMPLAINT:  cardiogenic shock    History of Present Illness:  68 year old male with past medical history of poorly controlled T2DM, GERD, HTN, HLD, CKD IIIb admitted 12/18 after presenting as code STEMI. Began having chest pain on Sunday (12/14) which resolved and then recurred.   Cath showed LAD occlusion tx with DES x 3 + IABP due to high LVEDP.  Persistent pain and cardiogenic shock post cath .  Overnight progressive shock so taken to cath lab.  IABP placed but no improvement so cannulated for VA ECMO.   PCCM consulted to assist with management.  Pertinent  Medical History  DM2 HTN HLD CKD  Significant Hospital Events: Including procedures, antibiotic start and stop dates in addition to other pertinent events   12/18-DES x 3 IABP 12/19 RHC: On NE 15, milrinone  0.375 and VP 0.02 Findings: Ao =81/58 (65) RA = 12 RV = 17/14 PA =  24/18 (20) PCW = 22 Fick cardiac output/index = 4.4/2.1 Thermo CO/CI =4.6/2.2 SVR = 770  Ao sat = 99% PA sat = 60%, 62% PAPi = 0.5 ECMO cannulation; not impella candidate d/t LV clot so using IABP to vent LV 12/21 Flow at 4.7lpm, sweep 0.6; lactate cleared. Bedside POCUS 20-25% CVP 7. Back in NSR. Pressor requirements much improvd. Only on epi. Had been on NE, VPA and methylene blue  12/22 remain on [place]  ECMO with flow 3.5 L, on IABP 1:1.  Came off of pressors overnight.  LDH is trending up.  Remained in sinus rhythm  Interim History / Subjective:  Remained afebrile, off vasopressors Went into A-fib with controlled rate, on IV amiodarone  infusion LDH and LFTs are improving.  Remain on VA ECMO with 3.5 L flow Had code stroke yesterday, CT head and CTA head and neck is negative for acute finding, symptoms resolved  Objective   Blood pressure 94/76, pulse (!) 111, temperature 97.7 F (36.5 C), resp.  rate 11, height 6' 1 (1.854 m), weight 94.1 kg, SpO2 96%. PAP: (14-39)/(9-28) 27/21 CVP:  [0 mmHg-54 mmHg] 10 mmHg CO:  [3.3 L/min-3.7 L/min] 3.7 L/min CI:  [1.52 L/min/m2-1.71 L/min/m2] 1.71 L/min/m2      Intake/Output Summary (Last 24 hours) at 05/26/2024 0748 Last data filed at 05/26/2024 0700 Gross per 24 hour  Intake 1686.41 ml  Output 3100 ml  Net -1413.59 ml   Filed Weights   05/24/24 1450 05/25/24 0615 05/26/24 0500  Weight: 94.5 kg 92.9 kg 94.1 kg    Examination: General: Acute on chronically ill-appearing male, lying on the bed HEENT: [place]/AT, eyes anicteric.  moist mucus membranes.  Right IJ Swan noted Neuro: Alert, awake following commands Chest: Coarse breath sounds, no wheezes or rhonchi Heart: Regularly irregular, no murmurs or gallops Abdomen: Soft, nontender, nondistended, bowel sounds present Extremities: ECMO cannula and IABP in place  Labs and images reviewed  Patient Lines/Drains/Airways Status     Active Line/Drains/Airways     Name Placement date Placement time Site Days   Arterial Line 05/22/24 Right Radial 05/22/24  2229  Radial  4   Peripheral IV 05/21/24 18 G Left Antecubital 05/21/24  0840  Antecubital  5   PICC Double Lumen 05/21/24 Right Brachial 45 cm 0 cm 05/21/24  1845  -- 5   IABP 9.0 Fr. 50 mL 05/22/24  2330  Right femoral  4   Sheath  05/23/24 Right Femoral 05/23/24  0400  Femoral  3   Sheath 05/23/24 Right Internal Jugular 05/23/24  0400  Internal Jugular  3   ECMO Drainage Single Lumen Cannula 05/23/24  0630  -- 3   ECMO Return Single Lumen Cannula 05/23/24  0630  -- 3   Closed System Drain 1 Left;Lateral LLQ Bulb (JP) 15 Fr. 12/09/13  1359  LLQ  3821   NG/OG Vented/Dual Lumen 14 Fr. Left nare Marking at nare/corner of mouth 75 cm 05/23/24  1000  Left nare  3   Urethral Catheter Johnnie Aid, RN Latex 14 Fr. 05/23/24  9341  Latex  3   Ureteral Drain/Stent Left ureter 6 Fr. 09/30/13  1631  Left ureter  3891   Ureteral Drain/Stent  Right ureter 6 Fr. 12/09/13  0900  Right ureter  3821   Ureteral Drain/Stent Left ureter 6 Fr. 12/09/13  0906  Left ureter  3821   Pulmonary Artery Catheter 05/22/24 Right 05/22/24  2239  -- 4            Resolved Hospital Problem list     Assessment & Plan:  Late presenting anterior STEMI s/p overlapping DES x 3 Acute HFrEF due to ischemic cardiomyopathy with cardiogenic shock status post IABP 12/19 and VA ECMO cannulation on 12/20  Post MI pericarditis LV thrombus Continue aspirin  and Brilinta  Continue Crestor  Continue colchicine  for possible post MI pericarditis Echocardiogram showed EF 25%, cardiac MRI is positive for LV thrombus Remain on VA ECMO with flow 3.5 L Lactate has cleared LDH and LFTs are improving ECMO was weaned down to 1.5 L, patient tolerated well.  Lactate and ABG looked okay His PAPi was less than 1, was started on dobutamine  resulting in hypotension requiring vasopressor support, requiring Levophed .  Dobutamine  was switched to milrinone  without much improvement in vasopressor support VA ECMO decannulation was cancelled due to hemodynamic change Advanced heart failure team is following Continue empiric vancomycin  and meropenem  while on ECMO Continue Biva infusion  Paroxysmal A-fib with RVR Patient went into A-fib with RVR overnight, currently on IV amiodarone  infusion after he received amiodarone  bolus Continue Bival for stroke prophylaxis  AKI on CKD3b Serum creatinine is stable around 2.4 Had 3 L urine output with a net -1.4 L Monitor intake and output Avoid nephrotoxic agent Repeat BMP in the morning  Delirium Continue Seroquel  and melatonin  Acute hypoxemic respiratory failure due to pulmonary edema He on 4 L nasal cannula oxygen Encourage incentive spirometry  ABLA expected post cannulation Thrombocytopenia critical illness H&H is stable, monitor and transfuse if less than 8 Partly hemoglobin is down because of hemolysis but LDH is  trending down now Platelet counts are stable around 110  TIA Patient had gaze deviation and hemiplegia, stroke code was called on 12/22 Neurological symptoms improved within 1 hour Head CT showed no neurological changes On antiplatelet and statin Also on bival for stroke prophylaxis in the setting of A-fib  Poorly controlled diabetes type 2 Patient hemoglobin A1c is 13.1 Had hypoglycemic episodes yesterday, currently on D10 W at 50 cc Monitor fingerstick goal 140-180   The patient is critically ill due to cardiogenic shock status post VA ECMO.  Critical care was necessary to treat or prevent imminent or life-threatening deterioration.  Critical care was time spent personally by me on the following activities: development of treatment plan with patient and/or surrogate as well as nursing, discussions with consultants, evaluation of patient's response to treatment, examination of patient, obtaining history from patient  or surrogate, ordering and performing treatments and interventions, ordering and review of laboratory studies, ordering and review of radiographic studies, pulse oximetry, re-evaluation of patient's condition and participation in multidisciplinary rounds.   During this encounter critical care time was devoted to patient care services described in this note for 47 minutes.     Valinda Novas, MD [place] [facility] See Amion for pager If no response to pager, please call [phone] until 7pm After 7pm, Please call E-link [phone]  "

## 2024-05-26 NOTE — Progress Notes (Signed)
 "    Advanced Heart Failure Rounding Note  Cardiologist: None   AH[facility] Cardiologist: Dr. Rolan  Chief Complaint: Late presenting anterior MI, acute CHF Patient Profile   Philip Richardson is a 68 y.o. male with history of poorly controlled DM II, HTN, HLD and CKD IIIb. Admitted with late presenting anterior STEMI and acute systolic CHF.  Significant events:   12/18: LHC 100% p LAD treated with 3 overlapping DES, residual 90% p diagonal post PCI. LVEDP 24 mmHg. 12/19 IABP placed for progressive shock 12/20 Cannulated for VA ECMO   Subjective:    Remains on VA ECMO with IABP vent. Had severe SIRS response initially with VA ECMO but improved methylene blue  and adjustment of drips  Remains off pressors.   POCUS echo EF 20-25%  Now back in AF this am. On IV amio    IABP 1:1   LA < 0.1   LDH and LFTs trending down after adjustment of ECMO flows and IABP  ECMO flow 3L overnight   Had code stroke yesterday but CT ok. Deficits resolved.   Denies CP or SOB   PAP: (16-39)/(10-28) 27/21 CVP:  [0 mmHg-54 mmHg] 9 mmHg CO:  [2.1 L/min-3.7 L/min] 2.1 L/min CI:  [1.3 L/min/m2-1.71 L/min/m2] 1.3 L/min/m2   Objective:    94.1 kg Body mass index is 27.37 kg/m.   Vital Signs:   Temp:  [97.5 F (36.4 C)-98.6 F (37 C)] 97.7 F (36.5 C) (12/23 0700) Pulse Rate:  [71-149] 111 (12/23 0700) Resp:  [4-31] 11 (12/23 0700) SpO2:  [82 %-100 %] 96 % (12/23 0700) Arterial Line BP: (113-197)/(22-89) 141/61 (12/23 0700) Weight:  [94.1 kg] 94.1 kg (12/23 0500) Last BM Date : 05/24/24  Weight change: Filed Weights   05/24/24 1450 05/25/24 0615 05/26/24 0500  Weight: 94.5 kg 92.9 kg 94.1 kg    Intake/Output:   Intake/Output Summary (Last 24 hours) at 05/26/2024 1045 Last data filed at 05/26/2024 0800 Gross per 24 hour  Intake 1393.3 ml  Output 2900 ml  Net -1506.7 ml     Physical Exam   General:  Lying in bed. No resp difficulty HEENT: normal Neck: supple. RIJ swan Cor:  Irregular rate & rhythm. No rubs, gallops or murmurs. Lungs: clear Abdomen: soft, nontender, nondistended.Good bowel sounds. Extremities: no cyanosis, clubbing, rash, trace edema  IABP and ECMO sites ok  Neuro: alert & orientedx3, cranial nerves grossly intact. moves all 4 extremities w/o difficulty. Affect pleasant  Telemetry   AF 100-105 Personally reviewed  Labs   CBC Recent Labs    05/25/24 1636 05/25/24 1643 05/26/24 0405 05/26/24 0535 05/26/24 0722 05/26/24 0943  WBC 9.4  --  8.5  --   --   --   HGB 9.2*   < > 9.4*   < > 9.2* 8.8*  HCT 28.4*   < > 29.7*   < > 27.0* 26.0*  MCV 87.4  --  87.1  --   --   --   PLT 104*  --  111*  --   --   --    < > = values in this interval not displayed.   Basic Metabolic Panel Recent Labs    87/78/74 1700 05/24/24 1713 05/25/24 0427 05/25/24 0428 05/25/24 1636 05/25/24 1643 05/26/24 0405 05/26/24 0535 05/26/24 0722 05/26/24 0943  NA 139   < > 138   < > 135   < > 137   < > 137 136  K 3.5   < > 4.1   < >  4.3   < > 4.0   < > 3.9 3.8  CL 94*  --  96*   < > 95*  --  97*  --   --   --   CO2 31  --  31   < > 29  --  27  --   --   --   GLUCOSE 75  --  111*   < > 195*  --  116*  --   --   --   BUN 42*  --  42*   < > 40*  --  40*  --   --   --   CREATININE 2.88*  --  2.72*   < > 2.45*  --  2.42*  --   --   --   CALCIUM  8.1*  --  8.1*   < > 7.9*  --  8.3*  --   --   --   MG 2.0  --  1.9  --   --   --  1.9  --   --   --   PHOS 3.9  --   --   --   --   --  3.2  --   --   --    < > = values in this interval not displayed.   Liver Function Tests Recent Labs    05/25/24 1356 05/26/24 0405  AST 1,007* 553*  ALT 821* 592*  ALKPHOS 208* 180*  BILITOT 1.6* 1.7*  PROT 4.9* 4.9*  ALBUMIN  2.5* 2.5*   No results for input(s): LIPASE, AMYLASE in the last 72 hours. Cardiac Enzymes Recent Labs    05/25/24 1118  CKTOTAL 374    BNP: BNP (last 3 results) No results for input(s): BNP in the last 8760 hours.  ProBNP (last 3  results) Recent Labs    05/21/24 1855  PROBNP 18,891.0*     D-Dimer No results for input(s): DDIMER in the last 72 hours. Hemoglobin A1C No results for input(s): HGBA1C in the last 72 hours.  Fasting Lipid Panel No results for input(s): CHOL, HDL, LDLCALC, TRIG, CHOLHDL, LDLDIRECT in the last 72 hours.  Medications:   Scheduled Medications:  (feeding supplement) PROSource Plus  30 mL Oral TID BM   aspirin   81 mg Per Tube Daily   Chlorhexidine  Gluconate Cloth  6 each Topical Q0600   clopidogrel   75 mg Per Tube Daily   lidocaine   1 patch Transdermal Q24H   melatonin  3 mg Per Tube QHS   metoCLOPramide  (REGLAN ) injection  5 mg Intravenous Q8H   multivitamin with minerals  1 tablet Oral Daily   pantoprazole  (PROTONIX ) IV  40 mg Intravenous QHS   QUEtiapine   50 mg Per Tube BID   rOPINIRole   0.25 mg Per Tube Daily   sodium chloride  flush  10-40 mL Intracatheter Q12H    Infusions:  sodium chloride      albumin  human Stopped (05/23/24 1130)   amiodarone  60 mg/hr (05/26/24 0832)   bivalirudin  (ANGIOMAX ) 250 mg in sodium chloride  0.9 % 500 mL (0.5 mg/mL) infusion 0.02 mg/kg/hr (05/26/24 0800)   dextrose  Stopped (05/25/24 1608)   DOBUTamine  2.5 mcg/kg/min (05/26/24 0957)   epinephrine  Stopped (05/24/24 2203)   meropenem  (MERREM ) IV 500 mg (05/26/24 0955)   norepinephrine  (LEVOPHED ) Adult infusion 5 mcg/min (05/26/24 1035)   vancomycin  Stopped (05/25/24 1735)   vasopressin  Stopped (05/24/24 0928)    PRN Medications: sodium chloride , acetaminophen  (TYLENOL ) oral liquid 160 mg/5 mL, acetaminophen , albumin  human,  guaiFENesin -dextromethorphan , ondansetron  (ZOFRAN ) IV, mouth rinse, phenol  Assessment/Plan   1. CAD: Late-presenting anterior STEMI, symptoms began on 12/14.  Cath was done showing occluded LAD that was treated with overlapping DES x 3.  There was residual 80% D1. Symptoms had a pleuritic character pre-cath, and he continues to have significant pleuritic  chest pain (not improved by PCI).  ECG post-cath showed persistent anterior STE.  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  No significant pericardial effusion present.  - Continue ASA 81 and ticagrelor .  - Continue Crestor  10 mg daily, has not been able to tolerate statins in the past so will start low dose.  - Continue colchicine  0.6 mg daily (renal dosing) given concern for post-infarct pericarditis, morphine  prn. Pain resolved. Only small effusion on POCUS echo  - No s/s angina 2. Acute systolic CHF: Ischemic cardiomyopathy. -> cardiogenic shock SCAI stage D - POCUS echo EF 25% - cMRI 30% + LV clot - Now on VA ECMO (12/20) with IABP vent. Pressors off - Lactate has cleared but LDH and LFTs climbing concerning for potential hemolysis. Circuit looks ok  - IABP and ECMO parameters reviewed personally.  - I weaned ECMO flows to 1.5 at bedside and patient tolerated well with stable BP and outputs. Likely ready for decannulation today (d/w VVS at bedside) Will start low-dose DBA to support decannulation  3. AKI on CKD stage 3b: Suspect diabetic nephropathy. Creatinine 2.2>2.7 (baseline appears to be low 2s) - Renal function improving with support Scr 3.22 -> 2.88 -> 2.47 -> 2.4 - no need for CVVHD at this point. D/w Renal 4. AF with RVR - continue IV amio and bival - back in AF this am. Bolus amio  4. Type 2 DM: Poor control with HgbA1c 13.1.   - improving 5. LV thrombus on cMRI  - on bival  6. Elevated LFTs and LDH - concerning for hemolysis ECMO and IABP adjusted. Numbers improved 7. TIA - code stroke on 12/22 - CT ok. Symptoms now resolved.  - repeat CT today  CRITICAL CARE Performed by: Cherrie Sieving  Total critical care time: 55 minutes  Critical care time was exclusive of separately billable procedures and treating other patients.  Critical care was necessary to treat or prevent imminent or life-threatening deterioration.  Critical care was time spent personally  by me (independent of midlevel providers or residents) on the following activities: development of treatment plan with patient and/or surrogate as well as nursing, discussions with consultants, evaluation of patient's response to treatment, examination of patient, obtaining history from patient or surrogate, ordering and performing treatments and interventions, ordering and review of laboratory studies, ordering and review of radiographic studies, pulse oximetry and re-evaluation of patient's condition.   Length of Stay: 5  Sieving Cherrie, MD  05/26/2024, 10:45 AM  Advanced Heart Failure Team Pager [phone] (M-F; 7a - 5p)   Please visit [email or web address]: For overnight coverage please call cardiology fellow first. If fellow not available call Shock/ECMO MD on call.  For ECMO / Mechanical Support (Impella, IABP, LVAD) issues call Shock / ECMO MD on call.      "

## 2024-05-26 NOTE — Consult Note (Addendum)
 " Hospital Consult    Reason for Consult:  echo decannulation Requesting Physician:  Bensimhon  MRN #:  [id number]  History of Present Illness: This is a 68 y.o. male who presented to the hospital with chest pain/STEMI.  He underwent cardiac catheterization with drug eluting stenting.  He subsequently required IABP insertion and ECMO cannulation on 05/23/2024.  He was requiring pressor support and this has been weaned off.  He does have INR 1.8 today.  EF 20-25%.   Vascular surgery is consulted for ECMO decannulation.   He is in atrial fibrillation and on amiodarone .  He has AKI on CKD IIIb. Renal function improved today to 2.42.    He has LV thrombus on bivalirudin .  Hx of DM with hgbA1c 13.1.   The pt is on asa/plavix /statin.  He has not tolerated statins in the past and has been started on low dose statin this hospitalization.    Past Medical History:  Diagnosis Date   Frequency of urination    GERD (gastroesophageal reflux disease)    Horseshoe kidney    BILATERAL   Hypertension    Renal calculus, bilateral    Type 2 diabetes mellitus (HCC)    Urgency of urination    Wears dentures     Past Surgical History:  Procedure Laterality Date   ARTERIAL LINE INSERTION N/A 05/22/2024   Procedure: ARTERIAL LINE INSERTION;  Surgeon: Cherrie Toribio SAUNDERS, MD;  Location: [facility];  Service: Cardiovascular;  Laterality: N/A;   BIOPSY  04/25/2020   Procedure: BIOPSY;  Surgeon: Cindie Carlin POUR, DO;  Location: [facility];  Service: Endoscopy;;  duodenum gastric esophagus   COLONOSCOPY WITH PROPOFOL  N/A 04/25/2020   internal hemorrhoids, one 5 mm polyp in descending colon. Tubular adenoma. 5 year surveillance.   CORONARY/GRAFT ACUTE MI REVASCULARIZATION N/A 05/21/2024   Procedure: Coronary/Graft Acute MI Revascularization;  Surgeon: Wonda Sharper, MD;  Location: [facility] INVASIVE CV LAB;  Service: Cardiovascular;  Laterality: N/A;   CORONARY/GRAFT ACUTE MI REVASCULARIZATION N/A  05/23/2024   Procedure: Coronary/Graft Acute MI Revascularization;  Surgeon: Cherrie Toribio SAUNDERS, MD;  Location: [facility];  Service: Cardiovascular;  Laterality: N/A;   CYSTOSCOPY W/ URETERAL STENT PLACEMENT Bilateral 12/09/2013   Procedure: CYSTOSCOPY WITH RETROGRADE PYELOGRAM/URETERAL STENT PLACEMENT;  Surgeon: Ricardo Likens, MD;  Location: [facility];  Service: Urology;  Laterality: Bilateral;   CYSTOSCOPY WITH RETROGRADE PYELOGRAM, URETEROSCOPY AND STENT PLACEMENT Bilateral 09/30/2013   Procedure: CYSTOSCOPY WITH BILATERAL RETROGRADE PYELOGRAM, LEFT DIAGNOSTIC URETEROSCOPY AND Left ureteral stent;  Surgeon: Ricardo Likens, MD;  Location[facility];  Service: Urology;  Laterality: Bilateral;   ECMO CANNULATION  05/23/2024   Procedure: ECMO CANNULATION;  Surgeon: Cherrie Toribio SAUNDERS, MD;  Location: [facility];  Service: Cardiovascular;;   ESOPHAGOGASTRODUODENOSCOPY (EGD) WITH PROPOFOL  N/A 04/25/2020   Mildly severe candida esophagitis without bleed, s/p biopsy. Suspicion for eosinophilic esophagitis but no increased eosinophils. Gastritis. Reactive gastropathy. Negative H.pylori.  +KOH prep.    IABP INSERTION N/A 05/22/2024   Procedure: IABP Insertion;  Surgeon: Cherrie Toribio SAUNDERS, MD;  Location: [facility];  Service: Cardiovascular;  Laterality: N/A;   LEFT HEART CATH AND CORONARY ANGIOGRAPHY N/A 05/21/2024   Procedure: LEFT HEART CATH AND CORONARY ANGIOGRAPHY;  Surgeon: Wonda Sharper, MD;  Location[facility] INVASIVE CV LAB;  Service: Cardiovascular;  Laterality: N/A;   PERCUTANEOUS NEPHROLITHOTRIPSY  2005   POLYPECTOMY  04/25/2020   Procedure: POLYPECTOMY;  Surgeon: Cindie Carlin POUR, DO;  Location: [facility];  Service: Endoscopy;;  colon   RIGHT HEART CATH N/A 05/22/2024   Procedure: RIGHT HEART CATH;  Surgeon: Cherrie Toribio SAUNDERS, MD;  Location: [facility];  Service: Cardiovascular;  Laterality: N/A;   ROBOT ASSISTED PYELOPLASTY N/A 12/09/2013    Procedure: ROBOTIC ASSISTED BILATERAL PYELOLITHOTOMY, RIGHT  PYELOPLASTY ;  Surgeon: Ricardo Likens, MD;  Location: [facility];  Service: Urology;  Laterality: N/A;    Allergies[1]  Prior to Admission medications  Medication Sig Start Date End Date Taking? Authorizing Provider  acetaminophen  (TYLENOL ) 325 MG tablet Take 325-650 mg by mouth every 6 (six) hours as needed (for pain.).   Yes [provider]  amLODipine  (NORVASC ) 10 MG tablet Take 1 tablet (10 mg total) by mouth daily. 10/14/23  Yes Martin, Mary-Margaret, FNP  cloNIDine  (CATAPRES ) 0.1 MG tablet Take 1 tablet (0.1 mg total) by mouth 3 (three) times daily. 04/09/24  Yes Gladis, Mary-Margaret, FNP  FARXIGA  10 MG TABS tablet Take 1 tablet (10 mg total) by mouth daily before breakfast. 11/29/23  Yes Gladis, Mary-Margaret, FNP  FEROSUL 325 (65 Fe) MG tablet TAKE ONE TABLET DAILY WITH BREAKFAST 12/30/23  Yes Gladis, Mary-Margaret, FNP  glimepiride  (AMARYL ) 4 MG tablet Take 1 tablet (4 mg total) by mouth daily with breakfast. 04/16/24  Yes Therisa Benton PARAS, NP  insulin  degludec (TRESIBA  FLEXTOUCH) 200 UNIT/ML FlexTouch Pen Inject 50 Units into the skin daily. 04/16/24  Yes Therisa Benton PARAS, NP  losartan (COZAAR) 25 MG tablet Take 25 mg by mouth daily.   Yes [provider]  Multiple Vitamins-Minerals (CENTRUM SILVER MEN 50+ PO) Take by mouth daily.   Yes [provider]  OVER THE COUNTER MEDICATION Take by mouth daily. Nature's Bounty - Magnesium  500 mg   Yes [provider]  pantoprazole  (PROTONIX ) 40 MG tablet Take 1 tablet (40 mg total) by mouth 2 (two) times daily. 10/14/23  Yes Gladis, Mary-Margaret, FNP  amoxicillin -clavulanate (AUGMENTIN ) 875-125 MG tablet Take 1 tablet by mouth 2 (two) times daily. Patient not taking: Reported on 05/21/2024 05/14/24   Gladis Mustard, FNP  Continuous Glucose Sensor (FREESTYLE LIBRE 2 SENSOR) MISC CHECK BLOOD SUGAR AS DIRECTED 04/02/24   Gladis, Mary-Margaret, FNP   glucose blood (ONETOUCH VERIO) test strip Use to test blood sugar twice daily. DX E11.9 03/14/20   Gladis Mustard, FNP  Insulin  Pen Needle (PEN NEEDLES 31GX5/16) 31G X 8 MM MISC Use to inject insulin  daily as prescribed 04/16/24   Therisa Benton PARAS, NP  OneTouch Delica Lancets 30G MISC Use to test blood sugar twice daily. DX E11.9 03/14/20   Gladis, Mary-Margaret, FNP  OVER THE COUNTER MEDICATION Take by mouth in the morning and at bedtime. [facility] Off    [provider]    Social History   Socioeconomic History   Marital status: Single    Spouse name: Not on file   Number of children: Not on file   Years of education: Not on file   Highest education level: Not on file  Occupational History   Not on file  Tobacco Use   Smoking status: Never   Smokeless tobacco: Never  Vaping Use   Vaping status: Never Used  Substance and Sexual Activity   Alcohol use: No    Comment: no history of etoh use   Drug use: No   Sexual activity: Not on file  Other Topics Concern   Not on file  Social History Narrative   Not on file   Social Drivers  of Health   Tobacco Use: Low Risk (05/21/2024)   Patient History    Smoking Tobacco Use: Never    Smokeless Tobacco Use: Never    Passive Exposure: Not on file  Financial Resource Strain: Low Risk (01/31/2024)   Overall Financial Resource Strain (CARDIA)    Difficulty of Paying Living Expenses: Not hard at all  Food Insecurity: No Food Insecurity (05/21/2024)   Epic    Worried About Programme Researcher, Broadcasting/film/video in the Last Year: Never true    Ran Out of Food in the Last Year: Never true  Transportation Needs: No Transportation Needs (05/21/2024)   Epic    Lack of Transportation (Medical): No    Lack of Transportation (Non-Medical): No  Physical Activity: Sufficiently Active (01/31/2024)   Exercise Vital Sign    Days of Exercise per Week: 7 days    Minutes of Exercise per Session: 30 min  Stress: No Stress Concern Present  (01/31/2024)   [employer] of Occupational Health - Occupational Stress Questionnaire    Feeling of Stress: Not at all  Social Connections: Moderately Integrated (05/21/2024)   Social Connection and Isolation Panel    Frequency of Communication with Friends and Family: More than three times a week    Frequency of Social Gatherings with Friends and Family: More than three times a week    Attends Religious Services: More than 4 times per year    Active Member of Clubs or Organizations: No    Attends Banker Meetings: Never    Marital Status: Married  Catering Manager Violence: Not At Risk (05/21/2024)   Epic    Fear of Current or Ex-Partner: No    Emotionally Abused: No    Physically Abused: No    Sexually Abused: No  Depression (PHQ2-9): Low Risk (05/14/2024)   Depression (PHQ2-9)    PHQ-2 Score: 0  Alcohol Screen: Low Risk (01/31/2024)   Alcohol Screen    Last Alcohol Screening Score (AUDIT): 0  Housing: Low Risk (05/21/2024)   Epic    Unable to Pay for Housing in the Last Year: No    Number of Times Moved in the Last Year: 0    Homeless in the Last Year: No  Utilities: Not At Risk (05/21/2024)   Epic    Threatened with loss of utilities: No  Health Literacy: Adequate Health Literacy (01/31/2024)   B1300 Health Literacy    Frequency of need for help with medical instructions: Never     Family History  Problem Relation Age of Onset   Cancer Mother    Colon cancer Neg Hx    Pancreatitis Neg Hx     ROS: [x]  Positive   [ ]  Negative   [ ]  All sytems reviewed and are negative Cardiac: [x]  chest pain/pressure [x]  hx MI  Pulmonary: [x]  currently on [place]   Endocrine:   [x]  diabetes []  thyroid  disease  GI [x]  GERD  GU: [x]  hx kidney stones     Physical Examination  Vitals:   05/26/24 0645 05/26/24 0700  BP:    Pulse: (!) 115 (!) 111  Resp: 17 11  Temp: 97.7 F (36.5 C) 97.7 F (36.5 C)  SpO2: 97% 96%   Body mass index is 27.37  kg/m.  General:  WDWN in NAD Gait: Not observed HENT: WNL, normocephalic Pulmonary: normal non-labored breathing on [place] Cardiac: irregular Extremities: balloon pump present right CFA and venous cannula in right CFV.  Arterial line present in left CFA. Musculoskeletal: no  muscle wasting or atrophy    CBC    Component Value Date/Time   WBC 8.5 05/26/2024 0405   RBC 3.41 (L) 05/26/2024 0405   HGB 9.2 (L) 05/26/2024 0722   HGB 14.2 04/07/2024 1445   HCT 27.0 (L) 05/26/2024 0722   HCT 43.2 04/07/2024 1445   PLT 111 (L) 05/26/2024 0405   PLT 189 04/07/2024 1445   MCV 87.1 05/26/2024 0405   MCV 87 04/07/2024 1445   MCH 27.6 05/26/2024 0405   MCHC 31.6 05/26/2024 0405   RDW 15.1 05/26/2024 0405   RDW 13.5 04/07/2024 1445   LYMPHSABS 1.3 05/21/2024 0840   LYMPHSABS 1.1 04/07/2024 1445   MONOABS 1.1 (H) 05/21/2024 0840   EOSABS 0.1 05/21/2024 0840   EOSABS 0.1 04/07/2024 1445   BASOSABS 0.0 05/21/2024 0840   BASOSABS 0.1 04/07/2024 1445    BMET    Component Value Date/Time   NA 137 05/26/2024 0722   NA 131 (L) 04/07/2024 1445   K 3.9 05/26/2024 0722   CL 97 (L) 05/26/2024 0405   CO2 27 05/26/2024 0405   GLUCOSE 116 (H) 05/26/2024 0405   BUN 40 (H) 05/26/2024 0405   BUN 33 (H) 04/07/2024 1445   CREATININE 2.42 (H) 05/26/2024 0405   CALCIUM  8.3 (L) 05/26/2024 0405   GFRNONAA 28 (L) 05/26/2024 0405   GFRAA 30 (L) 06/09/2020 1101    COAGS: Lab Results  Component Value Date   INR 1.8 (H) 05/26/2024   INR 1.9 (H) 05/25/2024   INR 1.8 (H) 05/24/2024     ASSESSMENT/PLAN: This is a 67 y.o. male admitted with STEMI requiring cardiac catheterization with placement of DES x 3, IABP support as well as ECMO.  Vascular surgery is consulted for ECMO decannulation.    -plan for ecmo decannulation later today.  Balloon pump present right CFA and venous cannula in right CFV.  Arterial line present in left CFA. -Dr. Pearline to evaluate pt and determine further plan -pt on  Angiomax  for LV thrombus.  Elevated liver enzymes and INR 1.8 today.    Lucie Apt, PA-C [facility] [phone]  VASCULAR STAFF ADDENDUM: I have independently interviewed and examined the patient. I agree with the above.  Per the ECMO team he is not ready today for decannulation.  Will plan to tentatively get on the OR schedule for tomorrow. He will need left groin cutdown for arterial cannula and reperfusion catheter removal, leaving balloon pump in right.  Norman GORMAN Pearline MD [facility] Phone Number: [phone] 05/26/2024 3:34 PM      [1]  Allergies Allergen Reactions   Atorvastatin  Other (See Comments)    Myopathy/weakness   Invokana  [Canagliflozin ] Other (See Comments)    weakness   Semaglutide  Other (See Comments)    Heartburn   "

## 2024-05-26 NOTE — Progress Notes (Signed)
 "                                                                                                                                                         Daily Progress Note   Patient Name: Philip Richardson       Date: 05/26/2024 DOB: Jun 04, 1956  Age: 68 y.o. MRN#: [id number] Attending Physician: Philip Toribio SAUNDERS, MD Primary Care Physician: Philip Mustard, FNP Admit Date: 05/21/2024  Reason for Consultation/Follow-up: Establishing goals of care  Subjective: Medical records reviewed including progress notes, labs, imaging.  Creatinine at 2.42.  LFTs improving today.  Patient assessed at the bedside.  He is confused.  His girlfriend is at the bedside.  Discussed with RN.  Unfortunately, patient was unable to participate in goals of care discussion today.  His significant other Philip Richardson recalls speaking with my colleague and requested another review of palliative medicine role.  I provided explanation and detailed review of role of PMT as an extra layer of support.  Provided with hard choices for loving people booklet.  She verbalized understanding.  She shares that her mother was on hospice last year and she is hopeful that patient will continue to make progress and not be facing end-of-life anytime soon.  She is not discussed with primary team in the last couple of days, though she believes patient's sister received an update at bedside yesterday.  She understands the updates received from nursing while she visits.  She has encouraged patient to consider completion advance directives and feels this will be important when he is more stable and has decision-making capacity.  Questions and concerns addressed. PMT will continue to support holistically.   Length of Stay: 5   Physical Exam Vitals and nursing note reviewed.  Constitutional:      General: He is not in acute distress.    Appearance: He is ill-appearing.     Interventions: Nasal cannula in place.  Cardiovascular:     Rate and  Rhythm: Tachycardia present. Rhythm irregular.  Pulmonary:     Effort: Pulmonary effort is normal. No respiratory distress.  Skin:    General: Skin is dry.  Neurological:     Mental Status: He is confused.  Psychiatric:        Cognition and Memory: Cognition is impaired.            Vital Signs: BP 94/76   Pulse (!) 111   Temp 97.7 F (36.5 C)   Resp 11   Ht 6' 1 (1.854 m)   Wt 94.1 kg   SpO2 96%   BMI 27.37 kg/m  SpO2: SpO2: 96 % O2 Device: O2 Device: Nasal Cannula O2 Flow Rate: O2 Flow Rate (L/min): 4 L/min      Palliative Assessment/Data: TBD   Palliative Care Assessment & Plan   Patient  Profile: 68 y.o. male  with past medical history significant of diabetes and chronic kidney disease admitted on 05/21/2024 with chest pain/STEMI.    Patient has no personal history of cardiac disease.  He developed substernal chest pain at church on Sunday.  His symptoms lasted several hours and then dissipated.  He called EMS today due to recurrent symptoms and a code STEMI was paged out.  No other recent health issues denies any surgeries, history of stroke, or history of bleeding.  He takes insulin  and oral hypoglycemic for treatment of diabetes.   Significant events: 05/21/2024: LHC 100%.  LAD treated with 3 overlapping DES, residual 90%.  Diagonal post PCI.  LVEDP 24 mmHg. 05/22/2024: IABP placed for progressive shock 05/23/2024: Cannulated for [facility] ECMO.   Currently, patient on [place] ECMO with a IABP vent.  Had severe SIRS response initially with VA ECMO but improved methylene blue  and adjustments of drips.   PMT has been consulted to assist with goals of care conversation. Patient/Family face treatment option decisions, advanced directive decisions and anticipatory care needs.    Family face treatment option decision, advance directive decisions and anticipatory care needs.   Assessment: Goals of care conversation STEMI Acute systolic CHF Cardiogenic shock AKI on CKD 3B A-fib  with RVR Elevated transaminases TIA  Recommendations/Plan: Continue full code/full scope treatment Patient unable to participate in follow-up goals of care discussion today.  Per previous discussion with my colleague on 12/21, current care plan is aligned with his preference for aggressive life-prolonging care [name] is legal next of kin for medical decision making.  Continue attempts to revisit advance directives as patient is able Ongoing goals of care discussions PMT will continue to follow and support   Prognosis:  Unable to determine  Discharge Planning: To Be Determined  Care plan was discussed with patient's significant other, RN         Philip Richardson Philip Fell, PA-C  Palliative Medicine Team Team phone # [phone]  Thank you for allowing the Palliative Medicine Team to assist in the care of this patient. Please utilize secure chat with additional questions, if there is no response within 30 minutes please call the above phone number.  Palliative Medicine Team providers are available by phone from 7am to 7pm daily and can be reached through the team cell phone.  Should this patient require assistance outside of these hours, please call the patient's attending physician.    Time Total: 35  Visit consisted of counseling and education dealing with the complex and emotionally intense issues of symptom management and palliative care in the setting of serious and potentially life-threatening illness. Greater than 50% of this time was spent counseling and coordinating care related to the above assessment and plan.  Personally spent 35 minutes in patient care including extensive chart review (labs, imaging, progress/consult notes, vital signs), medically appropraite exam, discussed with treatment team, education to patient, family, and staff, documenting clinical information, medication review and management, coordination of care, and available advanced directive documents.    "

## 2024-05-26 NOTE — Progress Notes (Signed)
 STROKE TEAM PROGRESS NOTE   INTERIM HISTORY/SUBJECTIVE Patient continues to have fluctuating disorientation per nurse at bedside, likely delirium.  Otherwise, able to answer questions, name objects, follow simple commands intermittently..  OBJECTIVE  CBC    Component Value Date/Time   WBC 12.5 (H) 05/26/2024 1619   RBC 3.34 (L) 05/26/2024 1619   HGB 9.1 (L) 05/26/2024 1619   HGB 14.2 04/07/2024 1445   HCT 28.4 (L) 05/26/2024 1619   HCT 43.2 04/07/2024 1445   PLT 133 (L) 05/26/2024 1619   PLT 189 04/07/2024 1445   MCV 85.0 05/26/2024 1619   MCV 87 04/07/2024 1445   MCH 27.2 05/26/2024 1619   MCHC 32.0 05/26/2024 1619   RDW 15.1 05/26/2024 1619   RDW 13.5 04/07/2024 1445   LYMPHSABS 1.3 05/21/2024 0840   LYMPHSABS 1.1 04/07/2024 1445   MONOABS 1.1 (H) 05/21/2024 0840   EOSABS 0.1 05/21/2024 0840   EOSABS 0.1 04/07/2024 1445   BASOSABS 0.0 05/21/2024 0840   BASOSABS 0.1 04/07/2024 1445    BMET    Component Value Date/Time   NA 136 05/26/2024 0943   NA 131 (L) 04/07/2024 1445   K 3.8 05/26/2024 0943   CL 97 (L) 05/26/2024 0405   CO2 27 05/26/2024 0405   GLUCOSE 116 (H) 05/26/2024 0405   BUN 40 (H) 05/26/2024 0405   BUN 33 (H) 04/07/2024 1445   CREATININE 2.42 (H) 05/26/2024 0405   CALCIUM  8.3 (L) 05/26/2024 0405   EGFR 28 (L) 04/07/2024 1445   GFRNONAA 28 (L) 05/26/2024 0405    IMAGING past 24 hours DG Chest Port 1 View Result Date: 05/26/2024 CLINICAL DATA:  ECMO. EXAM: PORTABLE CHEST 1 VIEW COMPARISON:  05/25/2024 FINDINGS: The cardio pericardial silhouette is enlarged. The NG tube passes into the stomach although the distal tip position is not included on the film. Right IJ central line tip overlies the right main pulmonary artery. ECMO cannula overlies the low right atrium in expected location of the IVC. Right PICC line tip is positioned over the region of the SVC/RA junction. The radiopaque tip of an aortic balloon pump projects over the expected location of the  transverse aorta. Is some basilar atelectasis with vascular congestion. IMPRESSION: 1. Support apparatus as described. 2. Vascular congestion with bibasilar atelectasis. Electronically Signed   By: Camellia Candle M.D.   On: 05/26/2024 07:55    Vitals:   05/26/24 1357 05/26/24 1358 05/26/24 1359 05/26/24 1400  BP:      Pulse: (!) 151 (!) 139 (!) 126 (!) 128  Resp: 16 17 (!) 25 14  Temp: 98.1 F (36.7 C) 98.1 F (36.7 C) 98.1 F (36.7 C) 97.9 F (36.6 C)  TempSrc:      SpO2: 97% 97% 97% 98%  Weight:      Height:         Mental Status: Patient is drowsy, easily awakened, follows some commands, can name objects intermittently.  Cranial Nerves: II: Visual Fields are full. Pupils are equal, round, and reactive to light.   III,IV, VI: Incomplete gaze to the left V: Facial sensation is symmetric to temperature VII: Facial movement is symmetric resting and smiling VIII: Hearing is intact to voice X: Palate elevates symmetrically XI: Shoulder shrug is symmetric. XII: Tongue protrudes midline without atrophy or fasciculations.  Motor: Tone is normal. Bulk is normal.  Lateral upper extremities with full strength.  Unable to elevate bilateral lower extremities due to ECMO cannulas.  Wiggles toes and plantar and dorsiflexion with full  strength Sensory: Localizes to noxious stimuli in all extremities Cerebellar: FNF intact      ASSESSMENT/PLAN  Philip Richardson is a 68 y.o. male with past medical history of poorly controlled diabetes, GERD, HTN, HLD, CKD IIIb admitted 12/18 after presenting as code STEMI.  He is currently on [place] ECMO.  Code stroke was activated for aphasia, dysarthria, confusion that occurred acutely at 1217 this afternoon.  CT head shows an age-indeterminate infarct in the left caudate head, likely old, no large vessel occlusion on CTA. Given that symptoms improved with patient laying flat suspect hypoperfusion/hypotension in the setting of ICAD while he had his HOB  elevated. Recommend map goal 75. He is already on maximal secondary stroke prevention with anticoagulation for his ECMO. no need for repeat CT head as patient is not hemodynamically stable and it would not likely change our management.  No further inpatient stroke workup indicated at this time.   Hospital day # 5  Philip Homans, MD Vascular Neurology  To contact Stroke Continuity provider, please refer to [email or web address]. After hours, contact General Neurology

## 2024-05-26 NOTE — Progress Notes (Signed)
 ANTICOAGULATION CONSULT NOTE  Pharmacy Consult for bivalirudin  Indication: LV thrombus + IABP + ECMO  Allergies[1]  Patient Measurements: Height: 6' 1 (185.4 cm) Weight: 94.1 kg (207 lb 7.3 oz) IBW/kg (Calculated) : 79.9 Heparin  Dosing Weight: 84 kg HEPARIN  DW (KG): 94.5  Vital Signs: Temp: 97.9 F (36.6 C) (12/23 1400) BP: 86/60 (12/23 1025) Pulse Rate: 128 (12/23 1400)  Labs: Recent Labs    05/24/24 0428 05/24/24 0624 05/25/24 0427 05/25/24 0428 05/25/24 1118 05/25/24 1356 05/25/24 1414 05/25/24 1636 05/25/24 1643 05/26/24 0405 05/26/24 0519 05/26/24 0535 05/26/24 0943 05/26/24 1619 05/26/24 1622  HGB 9.8*   < > 9.9*   < >  --  9.3*   < > 9.2*   < > 9.4*  --    < > 8.8* 9.1* 10.2*  HCT 28.9*   < > 31.0*   < >  --  29.4*   < > 28.4*   < > 29.7*  --    < > 26.0* 28.4* 30.0*  PLT 129*   < > 110*  --   --  105*  --  104*  --  111*  --   --   --  133*  --   APTT 38*   < > 72*   < >  --   --   --  71*  --   --  75*  --   --  68*  --   LABPROT 22.2*  --  22.6*  --   --   --   --   --   --   --  22.1*  --   --   --   --   INR 1.8*  --  1.9*  --   --   --   --   --   --   --  1.8*  --   --   --   --   CREATININE 3.22*   < > 2.72*  --   --  2.47*  --  2.45*  --  2.42*  --   --   --   --   --   CKTOTAL  --   --   --   --  374  --   --   --   --   --   --   --   --   --   --    < > = values in this interval not displayed.    Estimated Creatinine Clearance: 33 mL/min (A) (by C-G formula based on SCr of 2.42 mg/dL (H)).  Medical History: Past Medical History:  Diagnosis Date   Frequency of urination    GERD (gastroesophageal reflux disease)    Horseshoe kidney    BILATERAL   Hypertension    Renal calculus, bilateral    Type 2 diabetes mellitus (HCC)    Urgency of urination    Wears dentures     Assessment: 31 yoM presents as late presenting STEMI s/p overlapping DES x3 to LAD with ischemic CMP (EF 40% TTE, 30% cMRI) with LV thrombus on cMRI.  Not on  anticoagulation prior to admission.  Pharmacy consulted for heparin  dosing. Pt s/p IABP placement and then VA-ECMO cannulation on 12/20. Pharmacy to transition to bivalirudin .  aPTT therapeutic (68 sec) on infusion at 0.02 mg/kg/hr.  CBC stable, no overt bleeding or complications noted.  Goal of Therapy:  aPTT 50-70 seconds Monitor platelets by anticoagulation protocol: Yes   Plan:  Continuee bivalirudin   0.02 mg/kg/hr - dosing wt 84.2kg Check aPTT q12h - 5a/5p  Vito Ralph, PharmD, BCPS Please see amion for complete clinical pharmacist phone list  05/26/2024 5:03 PM            [1]  Allergies Allergen Reactions   Atorvastatin  Other (See Comments)    Myopathy/weakness   Invokana  [Canagliflozin ] Other (See Comments)    weakness   Semaglutide  Other (See Comments)    Heartburn

## 2024-05-26 NOTE — CV Procedure (Signed)
 ECMO DAILY NOTE:   Indication: Cardiogenic shock   Initial cannulation date: 05/23/24  ECMO day #4   ECMO type: [facility] ECMO   1) 25 FR multi-stage venous drainage in R CFV 2) 19 FR return cannula in L CFA     Daily data:   Flow 3.0 L RPM 2972 Sweep  00.5 L   Labs:   ABG    Component Value Date/Time   PHART 7.431 05/26/2024 0943   PCO2ART 41.0 05/26/2024 0943   PO2ART 71 (L) 05/26/2024 0943   HCO3 27.3 05/26/2024 0943   TCO2 29 05/26/2024 0943   ACIDBASEDEF 8.0 (H) 05/23/2024 0950   O2SAT 95 05/26/2024 0943     Plan:  ECMO wean performed at bedside with flows to 1.5 with stable hemodynamics and BP Plan ECMO decannulation today  Leave IABP in    Toribio Fuel, MD  10:50 AM

## 2024-05-27 ENCOUNTER — Inpatient Hospital Stay (HOSPITAL_COMMUNITY)

## 2024-05-27 DIAGNOSIS — I3139 Other pericardial effusion (noninflammatory): Secondary | ICD-10-CM

## 2024-05-27 DIAGNOSIS — Z955 Presence of coronary angioplasty implant and graft: Secondary | ICD-10-CM | POA: Diagnosis not present

## 2024-05-27 DIAGNOSIS — I429 Cardiomyopathy, unspecified: Secondary | ICD-10-CM | POA: Diagnosis not present

## 2024-05-27 DIAGNOSIS — I25119 Atherosclerotic heart disease of native coronary artery with unspecified angina pectoris: Secondary | ICD-10-CM | POA: Diagnosis not present

## 2024-05-27 DIAGNOSIS — R570 Cardiogenic shock: Secondary | ICD-10-CM | POA: Diagnosis not present

## 2024-05-27 DIAGNOSIS — I2102 ST elevation (STEMI) myocardial infarction involving left anterior descending coronary artery: Secondary | ICD-10-CM | POA: Diagnosis not present

## 2024-05-27 DIAGNOSIS — I5082 Biventricular heart failure: Secondary | ICD-10-CM | POA: Diagnosis not present

## 2024-05-27 DIAGNOSIS — I219 Acute myocardial infarction, unspecified: Secondary | ICD-10-CM | POA: Diagnosis not present

## 2024-05-27 LAB — POCT I-STAT 7, (LYTES, BLD GAS, ICA,H+H)
Acid-Base Excess: 1 mmol/L (ref 0.0–2.0)
Acid-Base Excess: 1 mmol/L (ref 0.0–2.0)
Acid-Base Excess: 0 mmol/L (ref 0.0–2.0)
Acid-Base Excess: 0 mmol/L (ref 0.0–2.0)
Acid-base deficit: 1 mmol/L (ref 0.0–2.0)
Acid-base deficit: 1 mmol/L (ref 0.0–2.0)
Acid-base deficit: 1 mmol/L (ref 0.0–2.0)
Bicarbonate: 21.1 mmol/L (ref 20.0–28.0)
Bicarbonate: 21.7 mmol/L (ref 20.0–28.0)
Bicarbonate: 22.6 mmol/L (ref 20.0–28.0)
Bicarbonate: 23 mmol/L (ref 20.0–28.0)
Bicarbonate: 23.9 mmol/L (ref 20.0–28.0)
Bicarbonate: 24.6 mmol/L (ref 20.0–28.0)
Bicarbonate: 25.1 mmol/L (ref 20.0–28.0)
Calcium, Ion: 1.08 mmol/L — ABNORMAL LOW (ref 1.15–1.40)
Calcium, Ion: 1.08 mmol/L — ABNORMAL LOW (ref 1.15–1.40)
Calcium, Ion: 1.11 mmol/L — ABNORMAL LOW (ref 1.15–1.40)
Calcium, Ion: 1.11 mmol/L — ABNORMAL LOW (ref 1.15–1.40)
Calcium, Ion: 1.11 mmol/L — ABNORMAL LOW (ref 1.15–1.40)
Calcium, Ion: 1.15 mmol/L (ref 1.15–1.40)
Calcium, Ion: 1.19 mmol/L (ref 1.15–1.40)
HCT: 24 % — ABNORMAL LOW (ref 39.0–52.0)
HCT: 25 % — ABNORMAL LOW (ref 39.0–52.0)
HCT: 26 % — ABNORMAL LOW (ref 39.0–52.0)
HCT: 26 % — ABNORMAL LOW (ref 39.0–52.0)
HCT: 26 % — ABNORMAL LOW (ref 39.0–52.0)
HCT: 27 % — ABNORMAL LOW (ref 39.0–52.0)
HCT: 27 % — ABNORMAL LOW (ref 39.0–52.0)
Hemoglobin: 8.2 g/dL — ABNORMAL LOW (ref 13.0–17.0)
Hemoglobin: 8.5 g/dL — ABNORMAL LOW (ref 13.0–17.0)
Hemoglobin: 8.8 g/dL — ABNORMAL LOW (ref 13.0–17.0)
Hemoglobin: 8.8 g/dL — ABNORMAL LOW (ref 13.0–17.0)
Hemoglobin: 8.8 g/dL — ABNORMAL LOW (ref 13.0–17.0)
Hemoglobin: 9.2 g/dL — ABNORMAL LOW (ref 13.0–17.0)
Hemoglobin: 9.2 g/dL — ABNORMAL LOW (ref 13.0–17.0)
O2 Saturation: 94 %
O2 Saturation: 95 %
O2 Saturation: 98 %
O2 Saturation: 98 %
O2 Saturation: 99 %
O2 Saturation: 99 %
O2 Saturation: 99 %
Patient temperature: 36.3
Patient temperature: 36.4
Patient temperature: 36.4
Patient temperature: 36.5
Patient temperature: 36.5
Patient temperature: 36.5
Patient temperature: 36.6
Potassium: 3.7 mmol/L (ref 3.5–5.1)
Potassium: 3.8 mmol/L (ref 3.5–5.1)
Potassium: 3.8 mmol/L (ref 3.5–5.1)
Potassium: 3.8 mmol/L (ref 3.5–5.1)
Potassium: 3.8 mmol/L (ref 3.5–5.1)
Potassium: 3.8 mmol/L (ref 3.5–5.1)
Potassium: 3.9 mmol/L (ref 3.5–5.1)
Sodium: 138 mmol/L (ref 135–145)
Sodium: 138 mmol/L (ref 135–145)
Sodium: 138 mmol/L (ref 135–145)
Sodium: 139 mmol/L (ref 135–145)
Sodium: 139 mmol/L (ref 135–145)
Sodium: 141 mmol/L (ref 135–145)
Sodium: 142 mmol/L (ref 135–145)
TCO2: 22 mmol/L (ref 22–32)
TCO2: 22 mmol/L (ref 22–32)
TCO2: 23 mmol/L (ref 22–32)
TCO2: 24 mmol/L (ref 22–32)
TCO2: 25 mmol/L (ref 22–32)
TCO2: 26 mmol/L (ref 22–32)
TCO2: 26 mmol/L (ref 22–32)
pCO2 arterial: 23.2 mmHg — ABNORMAL LOW (ref 32–48)
pCO2 arterial: 23.6 mmHg — ABNORMAL LOW (ref 32–48)
pCO2 arterial: 26.9 mmHg — ABNORMAL LOW (ref 32–48)
pCO2 arterial: 31.9 mmHg — ABNORMAL LOW (ref 32–48)
pCO2 arterial: 33.9 mmHg (ref 32–48)
pCO2 arterial: 36.6 mmHg (ref 32–48)
pCO2 arterial: 37.6 mmHg (ref 32–48)
pH, Arterial: 7.42 (ref 7.35–7.45)
pH, Arterial: 7.429 (ref 7.35–7.45)
pH, Arterial: 7.438 (ref 7.35–7.45)
pH, Arterial: 7.491 — ABNORMAL HIGH (ref 7.35–7.45)
pH, Arterial: 7.53 — ABNORMAL HIGH (ref 7.35–7.45)
pH, Arterial: 7.558 — ABNORMAL HIGH (ref 7.35–7.45)
pH, Arterial: 7.58 — ABNORMAL HIGH (ref 7.35–7.45)
pO2, Arterial: 102 mmHg (ref 83–108)
pO2, Arterial: 102 mmHg (ref 83–108)
pO2, Arterial: 111 mmHg — ABNORMAL HIGH (ref 83–108)
pO2, Arterial: 117 mmHg — ABNORMAL HIGH (ref 83–108)
pO2, Arterial: 65 mmHg — ABNORMAL LOW (ref 83–108)
pO2, Arterial: 66 mmHg — ABNORMAL LOW (ref 83–108)
pO2, Arterial: 94 mmHg (ref 83–108)

## 2024-05-27 LAB — PHOSPHORUS
Phosphorus: 4.4 mg/dL (ref 2.5–4.6)
Phosphorus: 4.2 mg/dL (ref 2.5–4.6)

## 2024-05-27 LAB — CBC
HCT: 26.4 % — ABNORMAL LOW (ref 39.0–52.0)
HCT: 27.6 % — ABNORMAL LOW (ref 39.0–52.0)
Hemoglobin: 8.6 g/dL — ABNORMAL LOW (ref 13.0–17.0)
Hemoglobin: 8.9 g/dL — ABNORMAL LOW (ref 13.0–17.0)
MCH: 27.4 pg (ref 26.0–34.0)
MCH: 27.5 pg (ref 26.0–34.0)
MCHC: 32.6 g/dL (ref 30.0–36.0)
MCHC: 32.2 g/dL (ref 30.0–36.0)
MCV: 84.1 fL (ref 80.0–100.0)
MCV: 85.2 fL (ref 80.0–100.0)
Platelets: 106 K/uL — ABNORMAL LOW (ref 150–400)
Platelets: 106 K/uL — ABNORMAL LOW (ref 150–400)
RBC: 3.14 MIL/uL — ABNORMAL LOW (ref 4.22–5.81)
RBC: 3.24 MIL/uL — ABNORMAL LOW (ref 4.22–5.81)
RDW: 15.2 % (ref 11.5–15.5)
RDW: 15.4 % (ref 11.5–15.5)
WBC: 8.1 K/uL (ref 4.0–10.5)
WBC: 6.6 K/uL (ref 4.0–10.5)
nRBC: 0 % (ref 0.0–0.2)
nRBC: 0 % (ref 0.0–0.2)

## 2024-05-27 LAB — BASIC METABOLIC PANEL WITH GFR
Anion gap: 14 (ref 5–15)
Anion gap: 14 (ref 5–15)
BUN: 43 mg/dL — ABNORMAL HIGH (ref 8–23)
BUN: 44 mg/dL — ABNORMAL HIGH (ref 8–23)
CO2: 25 mmol/L (ref 22–32)
CO2: 24 mmol/L (ref 22–32)
Calcium: 8.5 mg/dL — ABNORMAL LOW (ref 8.9–10.3)
Calcium: 8.6 mg/dL — ABNORMAL LOW (ref 8.9–10.3)
Chloride: 100 mmol/L (ref 98–111)
Chloride: 102 mmol/L (ref 98–111)
Creatinine, Ser: 2.3 mg/dL — ABNORMAL HIGH (ref 0.61–1.24)
Creatinine, Ser: 2.27 mg/dL — ABNORMAL HIGH (ref 0.61–1.24)
GFR, Estimated: 30 mL/min — ABNORMAL LOW
GFR, Estimated: 31 mL/min — ABNORMAL LOW
Glucose, Bld: 196 mg/dL — ABNORMAL HIGH (ref 70–99)
Glucose, Bld: 188 mg/dL — ABNORMAL HIGH (ref 70–99)
Potassium: 4 mmol/L (ref 3.5–5.1)
Potassium: 3.7 mmol/L (ref 3.5–5.1)
Sodium: 139 mmol/L (ref 135–145)
Sodium: 139 mmol/L (ref 135–145)

## 2024-05-27 LAB — GLUCOSE, CAPILLARY
Glucose-Capillary: 109 mg/dL — ABNORMAL HIGH (ref 70–99)
Glucose-Capillary: 136 mg/dL — ABNORMAL HIGH (ref 70–99)
Glucose-Capillary: 139 mg/dL — ABNORMAL HIGH (ref 70–99)
Glucose-Capillary: 162 mg/dL — ABNORMAL HIGH (ref 70–99)
Glucose-Capillary: 164 mg/dL — ABNORMAL HIGH (ref 70–99)
Glucose-Capillary: 176 mg/dL — ABNORMAL HIGH (ref 70–99)
Glucose-Capillary: 180 mg/dL — ABNORMAL HIGH (ref 70–99)

## 2024-05-27 LAB — COOXEMETRY PANEL
Carboxyhemoglobin: 2.5 % — ABNORMAL HIGH (ref 0.5–1.5)
Methemoglobin: <0.7 % (ref 0.0–1.5)
O2 Saturation: 71 %
Total hemoglobin: 9.1 g/dL — ABNORMAL LOW (ref 12.0–16.0)

## 2024-05-27 LAB — HEPATIC FUNCTION PANEL
ALT: 343 U/L — ABNORMAL HIGH (ref 0–44)
AST: 219 U/L — ABNORMAL HIGH (ref 15–41)
Albumin: 2.5 g/dL — ABNORMAL LOW (ref 3.5–5.0)
Alkaline Phosphatase: 173 U/L — ABNORMAL HIGH (ref 38–126)
Bilirubin, Direct: 1 mg/dL — ABNORMAL HIGH (ref 0.0–0.2)
Indirect Bilirubin: 0.4 mg/dL (ref 0.3–0.9)
Total Bilirubin: 1.4 mg/dL — ABNORMAL HIGH (ref 0.0–1.2)
Total Protein: 5 g/dL — ABNORMAL LOW (ref 6.5–8.1)

## 2024-05-27 LAB — LACTATE DEHYDROGENASE: LDH: 732 U/L — ABNORMAL HIGH (ref 105–235)

## 2024-05-27 LAB — CG4 I-STAT (LACTIC ACID): Lactic Acid, Venous: 0.9 mmol/L (ref 0.5–1.9)

## 2024-05-27 LAB — MAGNESIUM: Magnesium: 1.9 mg/dL (ref 1.7–2.4)

## 2024-05-27 LAB — FIBRINOGEN: Fibrinogen: >800 mg/dL — ABNORMAL HIGH (ref 210–475)

## 2024-05-27 LAB — PROTIME-INR
INR: 1.8 — ABNORMAL HIGH (ref 0.8–1.2)
Prothrombin Time: 21.6 s — ABNORMAL HIGH (ref 11.4–15.2)

## 2024-05-27 LAB — APTT
aPTT: 62 s — ABNORMAL HIGH (ref 24–36)
aPTT: 65 s — ABNORMAL HIGH (ref 24–36)

## 2024-05-27 MED ORDER — VITAL 1.5 CAL PO LIQD
1000.0000 mL | ORAL | Status: DC
  Administered 2024-05-27: 1000 mL
  Filled 2024-05-27: qty 1000

## 2024-05-27 MED ORDER — PROPOFOL 10 MG/ML IV BOLUS
50.0000 mg | Freq: Once | INTRAVENOUS | Status: DC
  Filled 2024-05-27: qty 20

## 2024-05-27 MED ORDER — FENTANYL CITRATE (PF) 50 MCG/ML IJ SOSY
100.0000 ug | PREFILLED_SYRINGE | Freq: Once | INTRAMUSCULAR | Status: AC
  Administered 2024-05-27: 100 ug via INTRAVENOUS
  Filled 2024-05-27: qty 2

## 2024-05-27 MED ORDER — STERILE WATER FOR INJECTION IJ SOLN
INTRAMUSCULAR | Status: AC
  Administered 2024-05-27: 5 mL
  Filled 2024-05-27: qty 10

## 2024-05-27 MED ORDER — ACETAZOLAMIDE SODIUM 500 MG IJ SOLR
500.0000 mg | Freq: Once | INTRAMUSCULAR | Status: AC
  Administered 2024-05-27: 500 mg via INTRAVENOUS
  Filled 2024-05-27: qty 500

## 2024-05-27 MED ORDER — THIAMINE MONONITRATE 100 MG PO TABS
100.0000 mg | ORAL_TABLET | Freq: Every day | ORAL | Status: DC
  Administered 2024-05-27 – 2024-05-30 (×3): 100 mg
  Filled 2024-05-27 (×4): qty 1

## 2024-05-27 MED ORDER — MIDAZOLAM HCL 2 MG/2ML IJ SOLN
INTRAMUSCULAR | Status: AC
  Administered 2024-05-27: 2 mg via INTRAVENOUS
  Filled 2024-05-27: qty 2

## 2024-05-27 MED ORDER — MIDAZOLAM HCL (PF) 2 MG/2ML IJ SOLN: 4.0000 mg | Freq: Once | INTRAMUSCULAR | Status: AC

## 2024-05-27 MED ORDER — PROSOURCE TF20 ENFIT COMPATIBL EN LIQD
60.0000 mL | Freq: Every day | ENTERAL | Status: DC
  Administered 2024-05-27 – 2024-05-30 (×2): 60 mL
  Filled 2024-05-27 (×4): qty 60

## 2024-05-27 MED ORDER — BISACODYL 10 MG RE SUPP
10.0000 mg | Freq: Every day | RECTAL | Status: DC
  Administered 2024-05-27 – 2024-05-30 (×2): 10 mg via RECTAL
  Filled 2024-05-27 (×3): qty 1

## 2024-05-27 MED ORDER — INSULIN ASPART 100 UNIT/ML IJ SOLN
0.0000 [IU] | INTRAMUSCULAR | Status: DC
  Administered 2024-05-27: 2 [IU] via SUBCUTANEOUS
  Administered 2024-05-27: 3 [IU] via SUBCUTANEOUS
  Administered 2024-05-27: 2 [IU] via SUBCUTANEOUS
  Administered 2024-05-27 – 2024-05-28 (×3): 3 [IU] via SUBCUTANEOUS
  Administered 2024-05-28 (×2): 2 [IU] via SUBCUTANEOUS
  Administered 2024-05-29: 8 [IU] via SUBCUTANEOUS
  Administered 2024-05-29: 5 [IU] via SUBCUTANEOUS
  Filled 2024-05-27: qty 2
  Filled 2024-05-27: qty 3
  Filled 2024-05-27: qty 2
  Filled 2024-05-27: qty 5
  Filled 2024-05-27: qty 8
  Filled 2024-05-27: qty 2
  Filled 2024-05-27 (×3): qty 3
  Filled 2024-05-27: qty 2

## 2024-05-27 MED ORDER — POTASSIUM CHLORIDE 20 MEQ PO PACK
40.0000 meq | PACK | Freq: Once | ORAL | Status: AC
  Administered 2024-05-27: 40 meq via ORAL
  Filled 2024-05-27: qty 2

## 2024-05-27 MED ORDER — PROPOFOL BOLUS VIA INFUSION
50.0000 mg | Freq: Once | INTRAVENOUS | Status: DC
  Filled 2024-05-27: qty 50

## 2024-05-27 NOTE — CV Procedure (Signed)
" °  Procedure: TEE  Indication: LV apical clot  Operator: Leily Capek  Patient on [facility] ECMO. Sedated by CCM service with versed  and fentanyl .   Probed passed with too much difficulty  LVEF 20-25% no apical clot RV severe HK  Moderate effusion along RV No LAA clot  Full report in [facility].  Toribio Fuel, MD  10:19 PM  "

## 2024-05-27 NOTE — Progress Notes (Signed)
 ANTICOAGULATION CONSULT NOTE  Pharmacy Consult for bivalirudin  Indication: LV thrombus + IABP + ECMO  Allergies[1]  Patient Measurements: Height: 6' 1 (185.4 cm) Weight: 91.5 kg (201 lb 11.5 oz) IBW/kg (Calculated) : 79.9 Heparin  Dosing Weight: 84 kg HEPARIN  DW (KG): 94.5  Vital Signs: Temp: 97.5 F (36.4 C) (12/24 1600) Temp Source: Core (12/24 0800) Pulse Rate: 65 (12/24 1600)  Labs: Recent Labs    05/25/24 0427 05/25/24 0428 05/25/24 1118 05/25/24 1356 05/26/24 0519 05/26/24 0535 05/26/24 1619 05/26/24 1622 05/27/24 0400 05/27/24 0723 05/27/24 1203 05/27/24 1613  HGB 9.9*   < >  --    < >  --    < > 9.1*   < > 8.6* 8.8* 9.2* 8.9*  HCT 31.0*   < >  --    < >  --    < > 28.4*   < > 26.4* 26.0* 27.0* 27.6*  PLT 110*  --   --    < >  --   --  133*  --  106*  --   --  106*  APTT 72*   < >  --    < > 75*  --  68*  --  62*  --   --  65*  LABPROT 22.6*  --   --   --  22.1*  --   --   --  21.6*  --   --   --   INR 1.9*  --   --   --  1.8*  --   --   --  1.8*  --   --   --   CREATININE 2.72*  --   --    < >  --   --  2.49*  --  2.30*  --   --  2.27*  CKTOTAL  --   --  374  --   --   --   --   --   --   --   --   --    < > = values in this interval not displayed.    Estimated Creatinine Clearance: 35.2 mL/min (A) (by C-G formula based on SCr of 2.27 mg/dL (H)).  Medical History: Past Medical History:  Diagnosis Date   Frequency of urination    GERD (gastroesophageal reflux disease)    Horseshoe kidney    BILATERAL   Hypertension    Renal calculus, bilateral    Type 2 diabetes mellitus (HCC)    Urgency of urination    Wears dentures     Assessment: 2 yoM presents as late presenting STEMI s/p overlapping DES x3 to LAD with ischemic CMP (EF 40% TTE, 30% cMRI) with LV thrombus on cMRI.  Not on anticoagulation prior to admission.  Pharmacy consulted for heparin  dosing. Pt s/p IABP placement and then [facility]-ECMO cannulation on 12/20. Pharmacy to transition to  bivalirudin .  aPTT 65 sec remains therapeutic on bivalirudin  0.02 mg/kg/hr.  Hgb 8.9, pltc 106 - stable.  No issues with circuit, stable .  Plan for IABP > Impella 5.5 tomorrow (12/25).  Goal of Therapy:  aPTT 50-70 seconds Monitor platelets by anticoagulation protocol: Yes   Plan:  Continue bivalirudin  0.02 mg/kg/hr - dosing wt 84.2kg Check aPTT q12h - 5a/5p F/u timing of Impella 5.5 tomorrow  Maurilio Fila, PharmD Clinical Pharmacist 05/27/2024  5:28 PM     [1]  Allergies Allergen Reactions   Atorvastatin  Other (See Comments)    Myopathy/weakness   Invokana  [Canagliflozin ] Other (  See Comments)    weakness   Semaglutide  Other (See Comments)    Heartburn

## 2024-05-27 NOTE — Progress Notes (Addendum)
 "  NAME:  Philip Richardson, MRN:  [id number], DOB:  Jun 27, 1955, LOS: 6 ADMISSION DATE:  05/21/2024, CONSULTATION DATE:  12/20 REFERRING MD:  Bensimhon, AHF CHIEF COMPLAINT:  cardiogenic shock    History of Present Illness:  68 year old male with past medical history of poorly controlled T2DM, GERD, HTN, HLD, CKD IIIb admitted 12/18 after presenting as code STEMI. Began having chest pain on Sunday (12/14) which resolved and then recurred.   Cath showed LAD occlusion tx with DES x 3 + IABP due to high LVEDP.  Persistent pain and cardiogenic shock post cath .  Overnight progressive shock so taken to cath lab.  IABP placed but no improvement so cannulated for VA ECMO.   PCCM consulted to assist with management.  Pertinent  Medical History  DM2 HTN HLD CKD  Significant Hospital Events: Including procedures, antibiotic start and stop dates in addition to other pertinent events   12/18-DES x 3 IABP 12/19 RHC: On NE 15, milrinone  0.375 and VP 0.02 Findings: Ao =81/58 (65) RA = 12 RV = 17/14 PA =  24/18 (20) PCW = 22 Fick cardiac output/index = 4.4/2.1 Thermo CO/CI =4.6/2.2 SVR = 770  Ao sat = 99% PA sat = 60%, 62% PAPi = 0.5 ECMO cannulation; not impella candidate d/t LV clot so using IABP to vent LV 12/21 Flow at 4.7lpm, sweep 0.6; lactate cleared. Bedside POCUS 20-25% CVP 7. Back in NSR. Pressor requirements much improvd. Only on epi. Had been on NE, VPA and methylene blue  12/22 remain on [place]  ECMO with flow 3.5 L, on IABP 1:1.  Came off of pressors overnight.  LDH is trending up.  Remained in sinus rhythm 12/23 patient remained afebrile, came off of vasopressor support, remained on [place] ECMO with 3.5 L flow, tolerated coming down on VA ECMO weaning to 1.5, became hypotensive after starting dobutamine  and required Levophed  infusion  Interim History / Subjective:  Patient went into A-fib with controlled rate Remain on amiodarone  infusion Vasopressor requirement improved from yesterday,  currently on 4 mics of Levophed  and epinephrine  2.5 LFTs continue to improve, serum creatinine trended down from 2.5-2.3  Objective   Blood pressure (!) 86/60, pulse 65, temperature 98.1 F (36.7 C), resp. rate 19, height 6' 1 (1.854 m), weight 91.5 kg, SpO2 97%. PAP: (9-60)/(-5-33) 17/8 CVP:  [0 mmHg-70 mmHg] 1 mmHg CO:  [3.6 L/min-7.1 L/min] 3.6 L/min CI:  [1.7 L/min/m2-3.2 L/min/m2] 1.7 L/min/m2      Intake/Output Summary (Last 24 hours) at 05/27/2024 1104 Last data filed at 05/27/2024 1000 Gross per 24 hour  Intake 2166.45 ml  Output 4490 ml  Net -2323.55 ml   Filed Weights   05/25/24 0615 05/26/24 0500 05/27/24 0500  Weight: 92.9 kg 94.1 kg 91.5 kg    Examination: General: Acute on chronically ill-appearing male, lying on the bed HEENT: [place]/AT, eyes anicteric.  moist mucus membranes.  NGT in place Neuro: Sleepy, opens eyes with vocal stimuli, following simple commands Chest: Coarse breath sounds, no wheezes or rhonchi Heart: Irregularly irregular, no murmurs or gallops Abdomen: Soft, nontender, nondistended, bowel sounds present Extremities: ECMO cannula and IABP in place, currently on 1:3  Labs and images reviewed  Patient Lines/Drains/Airways Status     Active Line/Drains/Airways     Name Placement date Placement time Site Days   Arterial Line 05/22/24 Right Radial 05/22/24  2229  Radial  5   Peripheral IV 05/21/24 18 G Left Antecubital 05/21/24  0840  Antecubital  6   PICC Double Lumen  05/21/24 Right Brachial 45 cm 0 cm 05/21/24  1845  -- 6   IABP 9.0 Fr. 50 mL 05/22/24  2330  Right femoral  5   Sheath 05/23/24 Right Femoral 05/23/24  0400  Femoral  4   Sheath 05/23/24 Right Internal Jugular 05/23/24  0400  Internal Jugular  4   ECMO Drainage Single Lumen Cannula 05/23/24  0630  -- 4   ECMO Return Single Lumen Cannula 05/23/24  0630  -- 4   NG/OG Vented/Dual Lumen 14 Fr. Left nare Marking at nare/corner of mouth 75 cm 05/23/24  1000  Left nare  4   Urethral  Catheter Johnnie Aid, RN Latex 14 Fr. 05/23/24  9341  Latex  4   Ureteral Drain/Stent Left ureter 6 Fr. 09/30/13  1631  Left ureter  3892   Ureteral Drain/Stent Right ureter 6 Fr. 12/09/13  0900  Right ureter  3822   Ureteral Drain/Stent Left ureter 6 Fr. 12/09/13  0906  Left ureter  3822   Pulmonary Artery Catheter 05/22/24 Right 05/22/24  2239  -- 5            Resolved Hospital Problem list     Assessment & Plan:  Coronary artery disease with late presenting anterior STEMI s/p overlapping DES x 3 Acute HFrEF due to ischemic cardiomyopathy with cardiogenic shock status post IABP 12/19 and VA ECMO cannulation on 12/20  Post MI pericarditis LV thrombus Continue DAPT with aspirin  and Brilinta  Continue Crestor  Continue colchicine  for possible post MI pericarditis Echocardiogram showed EF 25%, cardiac MRI is positive for LV thrombus TEE was done this morning showed no LV thrombus Remain on VA ECMO with flow 3.5 L Was restarted back on vasopressor support yesterday after he was put on dobutamine , currently on 4 mics of Levophed  and 2.5 mics of epinephrine  His cardiac index is 1.7 this morning Lactate has cleared LDH and LFTs continue to improve He may require Impella 5.5 as TEE showed no LV clot before consideration for VA ECMO decannulation Heart failure team is following Continue empiric vancomycin  and meropenem  Continue Bival infusion [place] 71%   Paroxysmal A-fib with RVR Patient remained in A-fib with RVR Continue amiodarone  infusion, currently on 60 mg/h Continue Bival for stroke prophylaxis  AKI on CKD3b Serum creatinine trended down to 2.3 Had 4.4 L urine output with net negative 2.5 L Monitor intake and output Avoid nephrotoxic agent Repeat BMP in the morning  Delirium Continue Seroquel  and melatonin He was started on Precedex  infusion yesterday, currently on 0.6 mcg/kg  Acute hypoxemic respiratory failure due to pulmonary edema He on 4 L nasal cannula  oxygen Encourage incentive spirometry  ABLA expected post cannulation Thrombocytopenia critical illness H&H is stable, monitor and transfuse if less than 8 Platelet count are stable between 100-110 Platelet counts are stable around 110  TIA Patient had gaze deviation and hemiplegia, stroke code was called on 12/22 Neurological symptoms improved within 1 hour Head CT showed no neurological changes On DAPT and statin Also on bival for stroke prophylaxis in the setting of A-fib  Poorly controlled diabetes type 2 Patient hemoglobin A1c is 13.1 D10 W was stopped as fingerstick reaching up to 200 Started on sliding scale insulin  He will be started on tube feeds Monitor fingerstick goal 140-180   The patient is critically ill due to cardiogenic shock status post VA ECMO.  Critical care was necessary to treat or prevent imminent or life-threatening deterioration.  Critical care was time spent personally by me on  the following activities: development of treatment plan with patient and/or surrogate as well as nursing, discussions with consultants, evaluation of patient's response to treatment, examination of patient, obtaining history from patient or surrogate, ordering and performing treatments and interventions, ordering and review of laboratory studies, ordering and review of radiographic studies, pulse oximetry, re-evaluation of patient's condition and participation in multidisciplinary rounds.   During this encounter critical care time was devoted to patient care services described in this note for 45 minutes.     Valinda Novas, MD [facility] See Amion for pager If no response to pager, please call [phone] until 7pm After 7pm, Please call E-link [phone]  "

## 2024-05-27 NOTE — Progress Notes (Signed)
 PT Cancellation Note  Patient Details Name: Philip Richardson MRN: [id number] DOB: [birth year]   Cancelled Treatment:    Reason Eval/Treat Not Completed: Medical issues which prohibited therapy, RN reports continued AMS and recommends PT check in another day for treatment progression. Will continue to follow acutely and progress as tolerated.   Izetta Call, PT, DPT   Acute Rehabilitation Department Office [phone] Secure Chat Communication Preferred   [name] Call 05/27/2024, 11:29 AM

## 2024-05-27 NOTE — Progress Notes (Signed)
 "    Advanced Heart Failure Rounding Note  Cardiologist: None   AHF Cardiologist: Dr. Rolan  Chief Complaint: Late presenting anterior MI, acute CHF Patient Profile   Philip Richardson is a 68 y.o. male with history of poorly controlled DM II, HTN, HLD and CKD IIIb. Admitted with late presenting anterior STEMI and acute systolic CHF.  Significant events:   12/18: LHC 100% p LAD treated with 3 overlapping DES, residual 90% p diagonal post PCI. LVEDP 24 mmHg. 12/19 IABP placed for progressive shock 12/20 Cannulated for VA ECMO   Subjective:    Remains on VA ECMO with IABP vent. Had severe SIRS response initially with VA ECMO but improved methylene blue  and adjustment of drips  Failed ECMO wean yesterday no back flowing 4L on epi 3 NE 5   Outputs low (I shot personally and CI 1.6)  IAB on 1:3  Back in AF despite IV amio   PAP: (11-32)/(-5-26) 21/12 CVP:  [0 mmHg-60 mmHg] 5 mmHg CO:  [3.6 L/min-4.8 L/min] 4.8 L/min CI:  [1.6 L/min/m2-2.2 L/min/m2] 2.2 L/min/m2   Objective:    91.5 kg Body mass index is 26.61 kg/m.   Vital Signs:   Temp:  [94.5 F (34.7 C)-98.2 F (36.8 C)] 97.7 F (36.5 C) (12/24 2215) Pulse Rate:  [59-121] 68 (12/24 2215) Resp:  [8-37] 26 (12/24 2215) SpO2:  [86 %-100 %] 99 % (12/24 2215) Arterial Line BP: (110-201)/(41-160) 181/71 (12/24 2215) Weight:  [91.5 kg] 91.5 kg (12/24 0500) Last BM Date : 05/24/24  Weight change: Filed Weights   05/25/24 0615 05/26/24 0500 05/27/24 0500  Weight: 92.9 kg 94.1 kg 91.5 kg    Intake/Output:   Intake/Output Summary (Last 24 hours) at 05/27/2024 2223 Last data filed at 05/27/2024 2217 Gross per 24 hour  Intake 2024.49 ml  Output 4195 ml  Net -2170.51 ml     Physical Exam   General:  Lying in bed. No resp difficulty HEENT: normal Neck: supple. RIJ swan  Cor: Irregular rate & rhythm. Lungs: clear Abdomen: soft, nontender, nondistended.Good bowel sounds. Extremities: no cyanosis, clubbing,  rash, edema ECMO cannulas and IABP site ok  Neuro: alert & orientedx3, cranial nerves grossly intact. moves all 4 extremities w/o difficulty. Affect pleasant   Telemetry   AF 60-80 Personally reviewed  Labs   CBC Recent Labs    05/27/24 0400 05/27/24 0723 05/27/24 1613 05/27/24 1739  WBC 8.1  --  6.6  --   HGB 8.6*   < > 8.9* 8.8*  HCT 26.4*   < > 27.6* 26.0*  MCV 84.1  --  85.2  --   PLT 106*  --  106*  --    < > = values in this interval not displayed.   Basic Metabolic Panel Recent Labs    87/76/74 0405 05/26/24 0535 05/27/24 0400 05/27/24 0723 05/27/24 1613 05/27/24 1739  NA 137   < > 139   < > 139 141  K 4.0   < > 4.0   < > 3.7 3.7  CL 97*   < > 100  --  102  --   CO2 27   < > 25  --  24  --   GLUCOSE 116*   < > 196*  --  188*  --   BUN 40*   < > 43*  --  44*  --   CREATININE 2.42*   < > 2.30*  --  2.27*  --   CALCIUM  8.3*   < >  8.5*  --  8.6*  --   MG 1.9  --  1.9  --   --   --   PHOS 3.2  --  4.4  --  4.2  --    < > = values in this interval not displayed.   Liver Function Tests Recent Labs    05/26/24 0405 05/27/24 0400  AST 553* 219*  ALT 592* 343*  ALKPHOS 180* 173*  BILITOT 1.7* 1.4*  PROT 4.9* 5.0*  ALBUMIN  2.5* 2.5*   No results for input(s): LIPASE, AMYLASE in the last 72 hours. Cardiac Enzymes Recent Labs    05/25/24 1118  CKTOTAL 374    BNP: BNP (last 3 results) No results for input(s): BNP in the last 8760 hours.  ProBNP (last 3 results) Recent Labs    05/21/24 1855  PROBNP 18,891.0*     D-Dimer No results for input(s): DDIMER in the last 72 hours. Hemoglobin A1C No results for input(s): HGBA1C in the last 72 hours.  Fasting Lipid Panel No results for input(s): CHOL, HDL, LDLCALC, TRIG, CHOLHDL, LDLDIRECT in the last 72 hours.  Medications:   Scheduled Medications:  aspirin   81 mg Per Tube Daily   bisacodyl   10 mg Rectal Daily   Chlorhexidine  Gluconate Cloth  6 each Topical Q0600    clopidogrel   75 mg Per Tube Daily   feeding supplement (PROSource TF20)  60 mL Per Tube Daily   insulin  aspart  0-15 Units Subcutaneous Q4H   lidocaine   1 patch Transdermal Q24H   melatonin  3 mg Per Tube QHS   metoCLOPramide  (REGLAN ) injection  5 mg Intravenous Q8H   multivitamin with minerals  1 tablet Oral Daily   pantoprazole  (PROTONIX ) IV  40 mg Intravenous QHS   propofol   50 mg Intravenous Once   QUEtiapine   50 mg Per Tube BID   rOPINIRole   0.25 mg Per Tube Daily   sodium chloride  flush  10-40 mL Intracatheter Q12H   thiamine   100 mg Per Tube Daily    Infusions:  sodium chloride      albumin  human Stopped (05/23/24 1130)   amiodarone  60 mg/hr (05/27/24 2200)   bivalirudin  (ANGIOMAX ) 250 mg in sodium chloride  0.9 % 500 mL (0.5 mg/mL) infusion 0.02 mg/kg/hr (05/27/24 2217)   dexmedetomidine  (PRECEDEX ) IV infusion 0.6 mcg/kg/hr (05/27/24 2212)   epinephrine  2.5 mcg/min (05/27/24 2200)   feeding supplement (VITAL 1.5 CAL) 20 mL/hr at 05/27/24 2200   meropenem  (MERREM ) IV 500 mg (05/27/24 2209)   milrinone  Stopped (05/26/24 1341)   norepinephrine  (LEVOPHED ) Adult infusion 10 mcg/min (05/27/24 2200)   vancomycin  Stopped (05/27/24 1721)   vasopressin  Stopped (05/24/24 0928)    PRN Medications: sodium chloride , acetaminophen  (TYLENOL ) oral liquid 160 mg/5 mL, acetaminophen , albumin  human, guaiFENesin -dextromethorphan , ondansetron  (ZOFRAN ) IV, mouth rinse, phenol  Assessment/Plan   1. CAD: Late-presenting anterior STEMI, symptoms began on 12/14.  Cath was done showing occluded LAD that was treated with overlapping DES x 3.  There was residual 80% D1. Symptoms had a pleuritic character pre-cath, and he continues to have significant pleuritic chest pain (not improved by PCI).  ECG post-cath showed persistent anterior STE.  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  No significant pericardial effusion present.  - Continue ASA 81 and ticagrelor .  - Continue Crestor  10 mg daily, has  not been able to tolerate statins in the past so will start low dose.  - Continue colchicine  0.6 mg daily (renal dosing) given concern for post-infarct pericarditis, morphine  prn. Pain resolved.  Only small effusion on POCUS echo  - No s/s angina 2. Acute systolic CHF: Ischemic cardiomyopathy. -> cardiogenic shock SCAI stage D - POCUS echo EF 25% - cMRI 30% + LV clot - Now on VA ECMO (12/20) with IABP vent.  - Failed ECMO wean 12/23 - does not tolerate afterlaad reduction with milrinone  and /or DBA - Now back on epi and NE but outputs low. Will titrate up  - Lactate has cleared but LDH and LFTs climbing concerning for potential hemolysis. Circuit looks ok  - IABP and ECMO parameters reviewed personally.  - As not able to weanECMO we did TEE today to see if LV apical clot persists and it is no longer present will plan Impella 5.5 to help wean ECMO. D/w TCTS personally  - Continue bival   3. AKI on CKD stage 3b: Suspect diabetic nephropathy. Creatinine 2.2>2.7 (baseline appears to be low 2s) - Renal function improving with support Scr 3.22 -> 2.88 -> 2.47 -> 2.4 -> 2.37 - no need for CVVHD at this point. 4. AF with RVR - continue IV amio and bival - will bolus amio.  4. Type 2 DM: Poor control with HgbA1c 13.1.   - improving 5. LV thrombus on cMRI  - on bival  - resolved on TEE 12/24 6. Elevated LFTs and LDH - concerning for hemolysis ECMO and IABP adjusted. Numbers improved 7. TIA - code stroke on 12/22 - CT ok. Symptoms now resolved.   CRITICAL CARE Performed by: Cherrie Sieving  Total critical care time: 50 minutes  Critical care time was exclusive of separately billable procedures and treating other patients.  Critical care was necessary to treat or prevent imminent or life-threatening deterioration.  Critical care was time spent personally by me (independent of midlevel providers or residents) on the following activities: development of treatment plan with patient and/or  surrogate as well as nursing, discussions with consultants, evaluation of patient's response to treatment, examination of patient, obtaining history from patient or surrogate, ordering and performing treatments and interventions, ordering and review of laboratory studies, ordering and review of radiographic studies, pulse oximetry and re-evaluation of patient's condition.   Length of Stay: 6  Sieving Cherrie, MD  05/27/2024, 10:23 PM  Advanced Heart Failure Team Pager [phone] (M-F; 7a - 5p)   Please visit [email or web address]: For overnight coverage please call cardiology fellow first. If fellow not available call Shock/ECMO MD on call.  For ECMO / Mechanical Support (Impella, IABP, LVAD) issues call Shock / ECMO MD on call.      "

## 2024-05-27 NOTE — Progress Notes (Signed)
 ANTICOAGULATION CONSULT NOTE  Pharmacy Consult for bivalirudin  Indication: LV thrombus + IABP + ECMO  Allergies[1]  Patient Measurements: Height: 6' 1 (185.4 cm) Weight: 91.5 kg (201 lb 11.5 oz) IBW/kg (Calculated) : 79.9 Heparin  Dosing Weight: 84 kg HEPARIN  DW (KG): 94.5  Vital Signs: Temp: 97 F (36.1 C) (12/24 0630) Temp Source: Core (12/24 0000) Pulse Rate: 97 (12/24 0630)  Labs: Recent Labs    05/25/24 0427 05/25/24 0428 05/25/24 1118 05/25/24 1356 05/26/24 0405 05/26/24 0519 05/26/24 0535 05/26/24 1619 05/26/24 1622 05/27/24 0017 05/27/24 0207 05/27/24 0400  HGB 9.9*   < >  --    < > 9.4*  --    < > 9.1*   < > 8.2* 8.5* 8.6*  HCT 31.0*   < >  --    < > 29.7*  --    < > 28.4*   < > 24.0* 25.0* 26.4*  PLT 110*  --   --    < > 111*  --   --  133*  --   --   --  106*  APTT 72*   < >  --    < >  --  75*  --  68*  --   --   --  62*  LABPROT 22.6*  --   --   --   --  22.1*  --   --   --   --   --  21.6*  INR 1.9*  --   --   --   --  1.8*  --   --   --   --   --  1.8*  CREATININE 2.72*  --   --    < > 2.42*  --   --  2.49*  --   --   --  2.30*  CKTOTAL  --   --  374  --   --   --   --   --   --   --   --   --    < > = values in this interval not displayed.    Estimated Creatinine Clearance: 34.7 mL/min (A) (by C-G formula based on SCr of 2.3 mg/dL (H)).  Medical History: Past Medical History:  Diagnosis Date   Frequency of urination    GERD (gastroesophageal reflux disease)    Horseshoe kidney    BILATERAL   Hypertension    Renal calculus, bilateral    Type 2 diabetes mellitus (HCC)    Urgency of urination    Wears dentures     Assessment: 72 yoM presents as late presenting STEMI s/p overlapping DES x3 to LAD with ischemic CMP (EF 40% TTE, 30% cMRI) with LV thrombus on cMRI.  Not on anticoagulation prior to admission.  Pharmacy consulted for heparin  dosing. Pt s/p IABP placement and then VA-ECMO cannulation on 12/20. Pharmacy to transition to  bivalirudin .  aPTT therapeutic (62 sec) on infusion at 0.02 mg/kg/hr.  CBC stable.  Goal of Therapy:  aPTT 50-70 seconds Monitor platelets by anticoagulation protocol: Yes   Plan:  Continuee bivalirudin  0.02 mg/kg/hr - dosing wt 84.2kg Check aPTT q12h - 5a/5p  Prentice Poisson, PharmD Clinical Pharmacist **Pharmacist phone directory can now be found on [email or web address] ([email or web address]).  Listed under [facility].             [1]  Allergies Allergen Reactions   Atorvastatin  Other (See Comments)    Myopathy/weakness   Invokana  [Canagliflozin ] Other (See Comments)  weakness   Semaglutide  Other (See Comments)    Heartburn

## 2024-05-27 NOTE — Procedures (Addendum)
 Cortrak  Person Inserting Tube:  Shereen Moats C, RD Tube Type:  Cortrak - 43 inches Tube Size:  10 Tube Location:  Right nare Secured by: Bridle Initial Placement:  Gastric Technique Used to Measure Tube Placement:  Marking at nare/corner of mouth Cortrak Secured At:  73 cm Initial Placement Verification:  Xray  Cortrak Tube Team Note:  Consult received to place a Cortrak feeding tube.    Pt on ECMO, x-ray is required. RN may begin using tube once confirmed by xray. Discussed with RN at bedside.   If the tube becomes dislodged please keep the tube and contact the Cortrak team at [email or web address] for replacement.  If after hours and replacement cannot be delayed, place a NG tube and confirm placement with an abdominal x-ray.    Moats SQUIBB., RD, LDN, CNSC See AMiON for contact information

## 2024-05-27 NOTE — Progress Notes (Signed)
 Vascular surgery progress note  Still working on weaning support  Not currently ready for decannulation today. Vascular surgery will continue to follow.  Norman GORMAN Serve MD [facility] Phone Number: [phone] 05/27/2024 10:19 AM

## 2024-05-27 NOTE — Progress Notes (Signed)
 Brief Nutrition Support Note  CCM approves initiation of trickle feeds via Cortrak. Pt remains on VA ECMO but attempting wean. Pressor support has improved since yesterday. See below for advancement recommendations when appropriate.  INTERVENTION:    Recommend initiation of enteral nutrition. NPO/CL day 6. Pt with acute catabolic illness with increased nutritional needs, in particular protein needs. Discussed with PCCM who agrees with plan.    Reverse trendelenburg <10 degrees once TF initiated until able to maintain HOB >30 degrees. Femoral cannulation currently, IABP   Recommend trickle TF upon initiation Vital 1.5 at 20 ml/hr with goal rate of 60 ml/hr Pro-Source TF20 60 mL BID TF provides 137 g of protein, 2320 kcals and 1094 mL of free water     Philip Glance, MS, RDN, LDN Clinical Dietitian I Please reach out via secure chat

## 2024-05-27 NOTE — CV Procedure (Signed)
 ECMO DAILY NOTE:   Indication: Cardiogenic shock   Initial cannulation date: 05/23/24  ECMO day #5   ECMO type: VA ECMO   1) 25 FR multi-stage venous drainage in R CFV 2) 19 FR return cannula in L CFA     Daily data:   Flow 4.1 RPM 3500 Sweep  0.5L   Labs:   ABG    Component Value Date/Time   PHART 7.530 (H) 05/27/2024 1739   PCO2ART 26.9 (L) 05/27/2024 1739   PO2ART 111 (H) 05/27/2024 1739   HCO3 22.6 05/27/2024 1739   TCO2 23 05/27/2024 1739   ACIDBASEDEF 1.0 05/27/2024 0017   O2SAT 99 05/27/2024 1739     Plan:  Failed ECMO wean. Cardiac outputs remain low. Increase pressors.  TEE without LV apical clot Consider Impella 5.5    Toribio Fuel, MD  10:19 PM

## 2024-05-27 NOTE — Consult Note (Signed)
 "     [address] [place] [address]             [phone]                   Tamer Baughman [facility] Medical Record #[id number] Date of Birth: 10/25/1955  Referring: No ref. provider found Primary Care: Gladis Mustard, FNP Primary Cardiologist: None  Chief Complaint:   No chief complaint on file.   History of Present Illness:    Philip Richardson 68 y.o. male admitted to the hospital on 12/18 with a STEMI.  He was taken to the cath lab for PCI to the LAD with DES.  He remained unstable with continued chest pain, and thus IABP was placed.  While in the ICU, he developed progressive cardiogenic shock, and thus was pace on [place] ECMO on 12/20.  He has failed weaning, and thus CT has been consulted for impella placement.      Past Medical History:  Diagnosis Date   Frequency of urination    GERD (gastroesophageal reflux disease)    Horseshoe kidney    BILATERAL   Hypertension    Renal calculus, bilateral    Type 2 diabetes mellitus (HCC)    Urgency of urination    Wears dentures     Past Surgical History:  Procedure Laterality Date   ARTERIAL LINE INSERTION N/A 05/22/2024   Procedure: ARTERIAL LINE INSERTION;  Surgeon: Cherrie Toribio SAUNDERS, MD;  Location: [facility] INVASIVE CV LAB;  Service: Cardiovascular;  Laterality: N/A;   BIOPSY  04/25/2020   Procedure: BIOPSY;  Surgeon: Cindie Carlin POUR, DO;  Location: [facility];  Service: Endoscopy;;  duodenum gastric esophagus   COLONOSCOPY WITH PROPOFOL  N/A 04/25/2020   internal hemorrhoids, one 5 mm polyp in descending colon. Tubular adenoma. 5 year surveillance.   CORONARY/GRAFT ACUTE MI REVASCULARIZATION N/A 05/21/2024   Procedure: Coronary/Graft Acute MI Revascularization;  Surgeon: Wonda Sharper, MD;  Location: [facility] INVASIVE CV LAB;  Service: Cardiovascular;  Laterality: N/A;   CORONARY/GRAFT ACUTE MI REVASCULARIZATION N/A 05/23/2024   Procedure: Coronary/Graft Acute MI Revascularization;  Surgeon:  Cherrie Toribio SAUNDERS, MD;  Location: [facility] INVASIVE CV LAB;  Service: Cardiovascular;  Laterality: N/A;   CYSTOSCOPY W/ URETERAL STENT PLACEMENT Bilateral 12/09/2013   Procedure: CYSTOSCOPY WITH RETROGRADE PYELOGRAM/URETERAL STENT PLACEMENT;  Surgeon: Ricardo Likens, MD;  Location: [facility] ORS;  Service: Urology;  Laterality: Bilateral;   CYSTOSCOPY WITH RETROGRADE PYELOGRAM, URETEROSCOPY AND STENT PLACEMENT Bilateral 09/30/2013   Procedure: CYSTOSCOPY WITH BILATERAL RETROGRADE PYELOGRAM, LEFT DIAGNOSTIC URETEROSCOPY AND Left ureteral stent;  Surgeon: Ricardo Likens, MD;  Location: [facility];  Service: Urology;  Laterality: Bilateral;   ECMO CANNULATION  05/23/2024   Procedure: ECMO CANNULATION;  Surgeon: Cherrie Toribio SAUNDERS, MD;  Location: [facility] INVASIVE CV LAB;  Service: Cardiovascular;;   ESOPHAGOGASTRODUODENOSCOPY (EGD) WITH PROPOFOL  N/A 04/25/2020   Mildly severe candida esophagitis without bleed, s/p biopsy. Suspicion for eosinophilic esophagitis but no increased eosinophils. Gastritis. Reactive gastropathy. Negative H.pylori.  +KOH prep.    IABP INSERTION N/A 05/22/2024   Procedure: IABP Insertion;  Surgeon: Cherrie Toribio SAUNDERS, MD;  Location: [facility] CV LAB;  Service: Cardiovascular;  Laterality: N/A;   LEFT HEART CATH AND CORONARY ANGIOGRAPHY N/A 05/21/2024   Procedure: LEFT HEART CATH AND CORONARY ANGIOGRAPHY;  Surgeon: Wonda Sharper, MD;  Location: [facility] INVASIVE CV LAB;  Service: Cardiovascular;  Laterality: N/A;   PERCUTANEOUS NEPHROLITHOTRIPSY  2005   POLYPECTOMY  04/25/2020  Procedure: POLYPECTOMY;  Surgeon: Cindie Carlin POUR, DO;  [facility]: [facility];  Service: Endoscopy;;  colon   RIGHT HEART CATH N/A 05/22/2024   Procedure: RIGHT HEART CATH;  Surgeon: Cherrie Toribio SAUNDERS, MD;  Location: [facility] INVASIVE CV LAB;  Service: Cardiovascular;  Laterality: N/A;   ROBOT ASSISTED PYELOPLASTY N/A 12/09/2013   Procedure: ROBOTIC ASSISTED BILATERAL PYELOLITHOTOMY, RIGHT  PYELOPLASTY ;   Surgeon: Ricardo Likens, MD;  Location: [facility];  Service: Urology;  Laterality: N/A;    Family History  Problem Relation Age of Onset   Cancer Mother    Colon cancer Neg Hx    Pancreatitis Neg Hx      Tobacco Use History[1]  Social History   Substance and Sexual Activity  Alcohol  Use No   Comment: no history of etoh use     Allergies[2]  Current Facility-Administered Medications  Medication Dose Route Frequency Provider Last Rate Last Admin   0.9 %  sodium chloride  infusion (Manually program via Guardrails IV Fluids)   Intravenous Continuous Bensimhon, Toribio SAUNDERS, MD       0.9 %  sodium chloride  infusion (Manually program via Guardrails IV Fluids)   Intravenous PRN Bensimhon, Toribio SAUNDERS, MD       acetaminophen  (TYLENOL ) 160 MG/5ML solution 650 mg  650 mg Per Tube Q6H PRN Babcock, Peter E, NP   650 mg at 05/26/24 0340   acetaminophen  (TYLENOL ) tablet 650 mg  650 mg Oral Q4H PRN Bensimhon, Toribio SAUNDERS, MD       albumin  human 5 % solution 12.5 g  12.5 g Intravenous PRN Bensimhon, Toribio SAUNDERS, MD   Stopped at 05/23/24 1130   amiodarone  (NEXTERONE  PREMIX) 360-4.14 MG/200ML-% (1.8 mg/mL) IV infusion  60 mg/hr Intravenous Continuous Bensimhon, Toribio SAUNDERS, MD 33.3 mL/hr at 05/27/24 1423 60 mg/hr at 05/27/24 1423   aspirin  chewable tablet 81 mg  81 mg Per Tube Daily Bitonti, Michael T, RPH   81 mg at 05/27/24 1210   bisacodyl  (DULCOLAX) suppository 10 mg  10 mg Rectal Daily Chand, Sudham, MD   10 mg at 05/27/24 1211   bivalirudin  (ANGIOMAX ) 250 mg in sodium chloride  0.9 % 500 mL (0.5 mg/mL) infusion  0.02 mg/kg/hr (Order-Specific) Intravenous Continuous Carney, Harlene BROCKS, RPH 3.4 mL/hr at 05/27/24 1100 0.02 mg/kg/hr at 05/27/24 1100   Chlorhexidine  Gluconate Cloth 2 % PADS 6 each  6 each Topical Q0600 Bensimhon, Daniel R, MD   6 each at 05/27/24 1000   clopidogrel  (PLAVIX ) tablet 75 mg  75 mg Per Tube Daily Bitonti, Michael T, RPH   75 mg at 05/27/24 1210   dexmedetomidine  (PRECEDEX ) 400 MCG/100ML (4  mcg/mL) infusion  0-1.2 mcg/kg/hr Intravenous Titrated Harold Scholz, MD 14.12 mL/hr at 05/27/24 1422 0.6 mcg/kg/hr at 05/27/24 1422   EPINEPHrine  (ADRENALIN ) 10 mg in sodium chloride  0.9 % 250 mL (0.04 mg/mL) infusion  0.5-30 mcg/min Intravenous Titrated Bensimhon, Toribio SAUNDERS, MD 3.75 mL/hr at 05/27/24 1100 2.5 mcg/min at 05/27/24 1100   feeding supplement (PROSource TF20) liquid 60 mL  60 mL Per Tube Daily Chand, Sudham, MD   60 mL at 05/27/24 1346   feeding supplement (VITAL 1.5 CAL) liquid 1,000 mL  1,000 mL Per Tube Continuous Chand, Sudham, MD 20 mL/hr at 05/27/24 1346 1,000 mL at 05/27/24 1346   guaiFENesin -dextromethorphan  (ROBITUSSIN DM) 100-10 MG/5ML syrup 5 mL  5 mL Oral Q4H PRN Bensimhon, Toribio SAUNDERS, MD   5 mL at 05/24/24 0205   insulin  aspart (novoLOG ) injection 0-15 Units  0-15 Units  Subcutaneous Q4H Harold Scholz, MD   3 Units at 05/27/24 1210   lidocaine  (LIDODERM ) 5 % 1 patch  1 patch Transdermal Q24H Colletta Manuelita Garre, PA-C   1 patch at 05/27/24 1346   melatonin tablet 3 mg  3 mg Per Tube QHS Marten Larraine HERO, RPH   3 mg at 05/26/24 2137   meropenem  (MERREM ) 500 mg in sodium chloride  0.9 % 100 mL IVPB  500 mg Intravenous Q12H Cyndy Ozell DASEN, [facility]   Stopped at 05/27/24 1045   metoCLOPramide  (REGLAN ) injection 5 mg  5 mg Intravenous Q8H Smith, Daniel C, MD   5 mg at 05/27/24 1346   milrinone  (PRIMACOR ) 20 MG/100 ML (0.2 mg/mL) infusion  0.125 mcg/kg/min Intravenous Continuous Bensimhon, Toribio SAUNDERS, MD   Stopped at 05/26/24 1341   multivitamin with minerals tablet 1 tablet  1 tablet Oral Daily Claudene Toribio BROCKS, MD   1 tablet at 05/27/24 1210   norepinephrine  (LEVOPHED ) 16 mg in (0.064 mg/mL) premix infusion  0-80 mcg/min Intravenous Titrated Bensimhon, Toribio SAUNDERS, MD 11.25 mL/hr at 05/27/24 1100 12 mcg/min at 05/27/24 1100   ondansetron  (ZOFRAN ) injection 4 mg  4 mg Intravenous Q6H PRN Bensimhon, Daniel R, MD   4 mg at 05/25/24 1335   Oral care mouth rinse  15 mL Mouth Rinse PRN  Bensimhon, Toribio SAUNDERS, MD       pantoprazole  (PROTONIX ) injection 40 mg  40 mg Intravenous QHS Claudene Toribio BROCKS, MD   40 mg at 05/26/24 2137   phenol (CHLORASEPTIC) mouth spray 1 spray  1 spray Mouth/Throat PRN Bensimhon, Toribio SAUNDERS, MD       propofol  (DIPRIVAN ) 10 mg/mL bolus/IV push 50 mg  50 mg Intravenous Once Gretel Prentice BIRCH, RPH       QUEtiapine  (SEROQUEL ) tablet 50 mg  50 mg Per Tube BID Chand, Sudham, MD   50 mg at 05/27/24 1210   rOPINIRole  (REQUIP ) tablet 0.25 mg  0.25 mg Per Tube Daily Bitonti, Michael T, RPH   0.25 mg at 05/27/24 1210   sodium chloride  flush (NS) 0.9 % injection 10-40 mL  10-40 mL Intracatheter Q12H Bensimhon, Toribio SAUNDERS, MD   10 mL at 05/27/24 1000   thiamine  (VITAMIN B1) tablet 100 mg  100 mg Per Tube Daily Chand, Sudham, MD   100 mg at 05/27/24 1346   vancomycin  (VANCOREADY) IVPB 750 mg/150 mL  750 mg Intravenous Q24H Denna Harlene BROCKS, RPH   Stopped at 05/26/24 1654   vasopressin  (PITRESSIN) 20 Units in 100 mL (0.2 unit/mL) infusion-*FOR SHOCK*  0-0.06 Units/min Intravenous Continuous Claudene Toribio BROCKS, MD   Stopped at 05/24/24 [phone]    Review of Systems  Unable to perform ROS: Critical illness    PHYSICAL EXAMINATION: BP (!) 86/60   Pulse 66   Temp 97.9 F (36.6 C)   Resp (!) 22   Ht 6' 1 (1.854 m)   Wt 91.5 kg   SpO2 98%   BMI 26.61 kg/m   Physical Exam Constitutional:      Comments: arousable  HENT:     Head: Normocephalic.  Cardiovascular:     Rate and Rhythm: Normal rate. Rhythm irregular.  Pulmonary:     Effort: Pulmonary effort is normal. No respiratory distress.  Abdominal:     General: There is no distension.      Diagnostic Studies & Laboratory data:     Recent Radiology Findings:   DG Abd Portable 1V Result Date: 05/27/2024 CLINICAL DATA:  [id number] Encounter for  feeding tube placement [id number] EXAM: PORTABLE ABDOMEN - 1 VIEW COMPARISON:  May 23, 2024 FINDINGS: Incomplete visualization of the pelvis. Feeding enteric tube tip terminates  over the distal stomach/proximal duodenum. Partial visualization of ECMO cannulation support apparatus and PA catheter. RIGHT pleural effusion, small to moderate. RIGHT-sided nephrolithiasis measuring 5 mm. IMPRESSION: Feeding enteric tube tip terminates over the distal stomach/proximal duodenum. Electronically Signed   By: Corean Salter M.D.   On: 05/27/2024 12:06   DG CHEST PORT 1 VIEW Result Date: 05/27/2024 EXAM: 1 VIEW(S) XRAY OF THE CHEST 05/27/2024 05:54:00 AM COMPARISON: 05/26/2024 CLINICAL HISTORY: Patient receiving ECMO FINDINGS: LINES, TUBES AND DEVICES: Stable intra-aortic balloon pump in place. Interval advancement of right IJ PA catheter with tip projecting over right hilum/interlobular artery. Right PICC with tip projecting over superior cavoatrial junction. ECMO cannula projecting over IVC. Enteric tube with tip extending below the field of view. LUNGS AND PLEURA: Persistent diffuse interstitial and airspace opacities. Small bilateral pleural effusions. No pneumothorax. HEART AND MEDIASTINUM: Stable cardiomegaly. BONES AND SOFT TISSUES: No acute osseous abnormality. IMPRESSION: 1. Interval advancement of right IJ PA catheter with tip projecting over the right interlobar pulmonary artery. Remaining supportive devices are unchanged. Electronically signed by: Michaeline Blanch MD 05/27/2024 11:16 AM EST RP Workstation: [id number]   CARDIAC CATHETERIZATION Result Date: 05/26/2024 On NE 15, milrinone  0.375 and VP 0.02 Findings: Ao =81/58 (65) RA = 12 RV = 17/14 PA =  24/18 (20) PCW = 22 Fick cardiac output/index = 4.4/2.1 Thermo CO/CI =4.6/2.2 SVR = 770 Ao sat = 99% PA sat = 60%, 62% PAPi = 0.5 Assessment: 1. Cardiogenic shock due to severe biventricular failure Plan/Discussion: Inotropes adjusted. IABP placed. (Unable to place Impella due to LV clot) Toribio Fuel, MD 11:37 PM  DG Chest Port 1 View Result Date: 05/26/2024 CLINICAL DATA:  ECMO. EXAM: PORTABLE CHEST 1 VIEW COMPARISON:   05/25/2024 FINDINGS: The cardio pericardial silhouette is enlarged. The NG tube passes into the stomach although the distal tip position is not included on the film. Right IJ central line tip overlies the right main pulmonary artery. ECMO cannula overlies the low right atrium in expected location of the IVC. Right PICC line tip is positioned over the region of the SVC/RA junction. The radiopaque tip of an aortic balloon pump projects over the expected location of the transverse aorta. Is some basilar atelectasis with vascular congestion. IMPRESSION: 1. Support apparatus as described. 2. Vascular congestion with bibasilar atelectasis. Electronically Signed   By: Camellia Candle M.D.   On: 05/26/2024 07:55   CT ANGIO HEAD NECK W WO CM (CODE STROKE) Result Date: 05/25/2024 EXAM: CTA HEAD AND NECK WITHOUT AND WITH 05/25/2024 12:52:14 PM TECHNIQUE: CTA of the head and neck was performed without and with the administration of 75 mL of iohexol  (OMNIPAQUE ) 350 MG/ML injection. Multiplanar 2D and/or 3D reformatted images are provided for review. Automated exposure control, iterative reconstruction, and/or weight based adjustment of the mA/kV was utilized to reduce the radiation dose to as low as reasonably achievable. Stenosis of the internal carotid arteries measured using NASCET criteria. COMPARISON: None available CLINICAL HISTORY: Neuro deficit, acute, stroke suspected. FINDINGS: CTA NECK: AORTIC ARCH AND ARCH VESSELS: Mixing artifact in the aortic arch and arch vessel origins. No dissection or arterial injury. No significant stenosis of the brachiocephalic or subclavian arteries. CERVICAL CAROTID ARTERIES: Right internal jugular (IJ) approach vascular catheter. No dissection, arterial injury, or hemodynamically significant stenosis by NASCET criteria. CERVICAL VERTEBRAL ARTERIES: No dissection, arterial injury, or significant  stenosis. LUNGS AND MEDIASTINUM: Moderate bilateral pleural effusions. Partially imaged  nasogastric tube. Right upper extremity PICC. SOFT TISSUES: No acute abnormality. BONES: No acute abnormality. CTA HEAD: ANTERIOR CIRCULATION: No significant stenosis of the internal carotid arteries. No significant stenosis of the anterior cerebral arteries. No significant stenosis of the middle cerebral arteries. No aneurysm. POSTERIOR CIRCULATION: Persistent fetal origin of the left posterior cerebral artery (PCA) with hypoplastic left P1 segment. No significant stenosis of the posterior cerebral arteries. No significant stenosis of the basilar artery. No significant stenosis of the vertebral arteries. No aneurysm. OTHER: No dural venous sinus thrombosis on this non-dedicated study. IMPRESSION: 1. No large vessel occlusion, hemodynamically significant stenosis, or aneurysm in the head or neck. 2. Moderate bilateral pleural effusions. Electronically signed by: Ryan Chess MD 05/25/2024 01:17 PM EST RP Workstation: [id number]   CT HEAD CODE STROKE WO CONTRAST Result Date: 05/25/2024 EXAM: CT HEAD WITHOUT CONTRAST 05/25/2024 12:51:19 PM TECHNIQUE: CT of the head was performed without the administration of intravenous contrast. Automated exposure control, iterative reconstruction, and/or weight based adjustment of the mA/kV was utilized to reduce the radiation dose to as low as reasonably achievable. COMPARISON: None available. CLINICAL HISTORY: Neuro deficit, acute, stroke suspected. FINDINGS: BRAIN AND VENTRICLES: No acute hemorrhage. Focal hypoattenuation in the left caudate head, consistent with age indeterminate infarct. No hydrocephalus. No extra-axial collection. No mass effect or midline shift. ORBITS: No acute abnormality. SINUSES: No acute abnormality. SOFT TISSUES AND SKULL: No acute soft tissue abnormality. No skull fracture. [place] Stroke Program Early CT Score (ASPECTS): Ganglionic (caudate, IC, lentiform nucleus, insula, M1-M3): 6 Supraganglionic (M4-M6): 3 Total: 9 IMPRESSION: 1. Focal  hypoattenuation in the left caudate head, consistent with age indeterminate infarct. ASPECTS 9. 2. These results were communicated to Dr. Dimitri at 12:58 PM on 05/25/24 by phone. Electronically signed by: Ryan Chess MD 05/25/2024 12:59 PM EST RP Workstation: [id number]   US  Abdomen Limited RUQ (LIVER/GB) Result Date: 05/25/2024 CLINICAL DATA:  Transaminitis. EXAM: ULTRASOUND ABDOMEN LIMITED RIGHT UPPER QUADRANT COMPARISON:  None Available. FINDINGS: Gallbladder: No gallstones or wall thickening visualized (2.8 mm). No sonographic Murphy sign noted by sonographer. Common bile duct: Diameter: 3.4 mm Liver: No focal lesion identified. There is diffusely increased echogenicity of the liver parenchyma. Portal vein is patent on color Doppler imaging with normal direction of blood flow towards the liver. Other: Of incidental note is the presence of ascites and a right pleural effusion. IMPRESSION: 1. Hepatic steatosis. 2. Right pleural effusion. 3. Ascites. Electronically Signed   By: Suzen Dials M.D.   On: 05/25/2024 12:08   DG CHEST PORT 1 VIEW Result Date: 05/25/2024 CLINICAL DATA:  ECMO patient. EXAM: PORTABLE CHEST 1 VIEW COMPARISON:  05/24/2024 FINDINGS: The cardio pericardial silhouette is enlarged. Basilar atelectasis with small bilateral layering pleural effusion suspected. Right IJ pulmonary artery catheter tip is in the main pulmonary outflow tract, potentially just into the right main pulmonary artery. The radiopaque tip of intra-aortic balloon pump projects over the expected location of the transverse aorta. The NG tube passes into the stomach although the distal tip position is not included on the film. Inferiorly placed ECMO cannula again noted. Right PICC line tip is obscured by the pulmonary artery catheter. IMPRESSION: 1. Support apparatus as described. 2. Similar basilar atelectasis with suspected small layering bilateral effusions. Electronically Signed   By: Camellia Candle M.D.   On:  05/25/2024 07:23   DG CHEST PORT 1 VIEW Result Date: 05/24/2024 EXAM: 1 VIEW(S) XRAY OF THE CHEST 05/24/2024  05:55:00 AM COMPARISON: 05/23/2024 CLINICAL HISTORY: Patient receiving ECMO FINDINGS: LINES, TUBES AND DEVICES: Intra-aortic balloon pump in place with tip in proximal descending thoracic aorta, unchanged. Inferior approach ECMO cannula in place with tip in right atrium. Right IJ Swan-Ganz catheter in place with tip in right main pulmonary artery. Nasogastric tube in place coursing below diaphragm with tip and side port beyond inferior image margin. Right PICC in place with tip in right atrium. LUNGS AND PLEURA: Mild bilateral interstitial thickening. Low lung volumes. Mild bibasilar atelectasis. Trace right pleural effusion. No pneumothorax. HEART AND MEDIASTINUM: Coronary artery stent in place. No acute abnormality of the cardiac and mediastinal silhouettes. BONES AND SOFT TISSUES: No acute osseous abnormality. IMPRESSION: 1. Intra-aortic balloon pump, ECMO cannula, Swan-Ganz catheter, nasogastric tube, and right PICC in place. 2. Mild bilateral interstitial thickening, low lung volumes, and mild bibasilar atelectasis. 3. Trace right pleural effusion. Electronically signed by: Waddell Calk MD 05/24/2024 07:53 AM EST RP Workstation: [id number]   US  RENAL Result Date: 05/23/2024 EXAM: US  Retroperitoneum Complete, Renal. 05/23/2024 05:27:00 PM TECHNIQUE: Real-time ultrasonography of the retroperitoneum renal was performed. COMPARISON: US  Renal 05/31/2015. MRI abdomen 10/07/2019. CLINICAL HISTORY: AKI (acute kidney injury). FINDINGS: Horseshoe kidney. RIGHT KIDNEY/URETER: Right kidney moiety measures 13.5 x 6.6 x 4.2 cm. Shadowing calculus in the right renal moiety measuring 1.0 mm. Mild prominence of the right renal pelvis. Increased cortical echogenicity. No mass. LEFT KIDNEY/URETER: Left kidney moiety measures 10.9 x 5.3 x 4.2 cm. Mild prominence of the left renal pelvis. Increased cortical  echogenicity. No calculus. No mass. BLADDER: Decompressed bladder around a foley catheter. LIMITATIONS: Limited imaging due to body habitus and mental status. IMPRESSION: 1. Horseshoe kidney. 2. Increased cortical echogenicity compatible with medical renal disease. 3. 1.0 mm shadowing calculus in the right renal moiety. Electronically signed by: Norman Gatlin MD 05/23/2024 07:32 PM EST RP Workstation: [id number]   DG Abd 1 View Result Date: 05/23/2024 EXAM: XR ABDOMEN INTRAOPERATIVE COUNT 1 VIEW(S) XRAY OF THE ABDOMEN 05/23/2024 01:29:00 PM COMPARISON: None available. CLINICAL HISTORY: Encounter for nasogastric (NG) tube placement FINDINGS: No unexpected retained surgical items. Enteric tube in place with tip and side port terminating within the expected location of the distal stomach. ECMO catheter in place extending up to the inferior cavoatrial junction. Moderate colonic stool burden. IMPRESSION: 1. Enteric tube in place with tip and side port terminating within the expected location of the distal stomach. Electronically signed by: Waddell Calk MD 05/23/2024 01:41 PM EST RP Workstation: [id number]   DG Chest Port 1 View Result Date: 05/23/2024 EXAM: 1 VIEW(S) XRAY OF THE CHEST 05/23/2024 08:03:00 AM COMPARISON: 05/23/2024 CLINICAL HISTORY: Patient receiving ECMO FINDINGS: LINES, TUBES AND DEVICES: Right IJ Swan-Ganz catheter with tip overlying right pulmonary artery. Right arm PICC line tip terminates in the right atrium. Radiopaque marker overlying proximal descending thoracic aorta, likely representing aortic balloon catheter. LUNGS AND PLEURA: Low lung volume. Probable atelectasis at left lung base. No pleural effusion. No pneumothorax. HEART AND MEDIASTINUM: Stable mild cardiomegaly. Coronary artery stent noted. BONES AND SOFT TISSUES: No acute osseous abnormality. IMPRESSION: 1. Low lung volume with probable atelectasis at the left lung base, without pleural effusion or pneumothorax. Electronically  signed by: Waddell Calk MD 05/23/2024 08:09 AM EST RP Workstation: [id number]   CARDIAC CATHETERIZATION Result Date: 05/23/2024 Successful [facility] ECMO cannulation for cardiogenic shock.   Port CXR Result Date: 05/23/2024 CLINICAL DATA:  Check central line placement EXAM: PORTABLE CHEST 1 VIEW COMPARISON:  05/21/2024 FINDINGS: Cardiac shadow is mildly prominent but  accentuated by the portable technique. Right jugular Swan-Ganz catheter is noted extending into the right pulmonary artery. No pneumothorax is noted. Right PICC is seen extending to the mid right atrium. Lungs are well aerated. Minimal left basilar atelectasis is seen. No bony abnormality is noted. IMPRESSION: Central lines as described.  No pneumothorax is noted. Electronically Signed   By: Oneil Devonshire M.D.   On: 05/23/2024 02:34   MR CARDIAC MORPHOLOGY W WO CONTRAST Result Date: 05/22/2024 CLINICAL DATA:  Cardiomyopathy suspected Assess for LV thrombus EXAM: MR CARDIA MORPHOLOGY WITHOUT AND WITH CONTRAST; MR CARDIAC VELOCITY FLOW MAPPING TECHNIQUE: The patient was scanned on a 1.5 Tesla Siemens magnet. A dedicated cardiac coil was used. Functional imaging was done using TrueFisp sequences. 2,3, and 4 chamber views were done to assess for RWMA's. Modified Simpson's rule using a short axis stack was used to calculate an ejection fraction on a dedicated work [employer]. The patient received 13mL GADAVIST  GADOBUTROL  1 MMOL/ML IV SOLN. After 10 minutes inversion recovery sequences were used to assess for infiltration and scar tissue. Phase contrast velocity encoded images obtained x 2. This examination is tailored for evaluation cardiac anatomy and function and provides very limited assessment of noncardiac structures, which are accordingly not evaluated during interpretation. If there is clinical concern for extracardiac pathology, further evaluation with CT imaging should be considered. FINDINGS: 1. The left ventricle is normal  in cavity size. There is mild septal hypertrophy. There is moderate-severe left ventricular systolic dysfunction with an LV ejection fraction calculated at 30%. There are regional wall motion abnormalities. The anterior, anteroseptal, anterolateral walls and apex are all akinetic. Systolic function at the base and inferior wall is relatively preserved. On T2 weighted imaging, there is prominence of the T2 signal in the subendocardium of the anterior and more diffusely in the apex suggestive of myocardial edema in these regions. The native T2 values in these regions are prolonged further supporting these findings. 2. The right ventricle is normal in cavity size, wall thickness, and systolic function. There are no RV wall motion abnormalities or aneurysms. 3. Both atria are normal in size. 4. The aortic valve is trileaflet in morphology. There is no significant aortic valve stenosis or regurgitation. There is trivial tricuspid regurgitation, but no significant valvular disease. 5. Delayed enhancement imaging is abnormal. The mid-distal septum, mid-distal lateral wall and apex demonstrate near transmural LGE. The distal septum and apex further demonstrate areas of no-reflow. These regions of myocardium are non-viable. 6. There is a moderate circumferential pericardial effusion. There is systolic collapse of the right atrium, but no evidence of diastolic collapse of the right ventricle or ventircular interdependence to suggest tamponade physiology. The thickness of the pericardium is normal. There does appear to be some enhancement of the pericardium adjacent to the apical lateral wall consistent with pericardial inflammation. 7. The proximal aorta is normal in size and caliber. 8. There appears to be a small apical thrombus seen best on delayed enhancement imaging. There may also be some layering thrombus in the apex as well. 9. Consolidative opacity in the right middle lung space. Unclear if this represents  atelectasis vs infiltrate. Recommend dedicated CT imaging of the lung for further evaluation. CONCLUSIONS: Moderate to severe LV systolic dysfunction due to ischemic cardiomyopathy with <50% viability. T2 prominence in the areas of infarct suggest acute myocardial injury as well. There is evidence of apical thrombus due to apical akinesis. Evidence of mild pericarditis limited to the pericardium adjacent to the apex  and lateral wall. Moderate pericardial effusion without tamponade. Recommend dedicated CT chest to better evaluate right middle lung consolidation if clinically appropriate. MEASUREMENTS: LEFT VENTRICLE Left Ventricular End-Diastolic Chamber Size: 3.1 cm Left Ventricular Septal Wall Thickness: 1.3 cm Left Ventricular Posterior Wall Thickness: 0.9 cm Left Ventricular Maximal Wall Thickness: 1.3 cm LV male LV EF: 30% (normal 49-79%) Absolute volumes: LV EDV: (normal 95-215 mL) LV ESV: 77mL (normal 25-85 mL) LV SV: 33mL (normal 61-145 mL) CO: 2.9L/min (normal 3.4-7.8 L/min) Indexed volumes: CI: 1.4L/min/sq-m (normal 1.8-4.2 L/min/sq-m) LV EDV: 21mL/sq-m (normal 50-108 mL/sq-m) LV ESV: 69mL/sq-m (normal 11-47 mL/sq-m) LV SV: 17mL/sq-m (normal 33-72 mL/sq-m) RIGHT VENTRICLE RV male RV EF:  48% (normal 42-72%) Absolute volumes: RV EDV: 86mL (normal 109-217 mL) RV ESV: 45mL (normal 23-91 mL) RV SV: 41mL (normal 71-141 mL) CO: 3.5L/min (normal 2.8-8.8 L/min) Indexed volumes: CI: 1.7L/min/sq-m (normal 1.7-4.2 L/min/sq-m) RV EDV: 65mL/sq-m (normal 58-109 mL/sq-m) RV ESV: 3mL/sq-m (normal 12-46 mL/sq-m) RV SV: 49mL/sq-m (normal 38-71 mL/sq-m) ATRIA Left atrium: Normal size and morphology Right atrium: Normal size and morphology VALVES Aortic valve: The aortic valve is trileaflet without regurgitation or stenosis. Mitral valve: Normal morphology without regurgitation or stenosis. Tricuspid valve: Normal morphology with trivial tricuspid regurgitation. Pulmonic valve: Normal morphology without regurgitation or  stenosis. OTHER Consolidative opacity in the right middle lung space. Unclear if this represents atelectasis vs infiltrate. Recommend dedicated CT imaging of the lung for further evaluation. Electronically Signed   By: Georganna Archer   On: 05/22/2024 11:43   MR CARDIAC VELOCITY FLOW MAP Result Date: 05/22/2024 CLINICAL DATA:  Cardiomyopathy suspected Assess for LV thrombus EXAM: MR CARDIA MORPHOLOGY WITHOUT AND WITH CONTRAST; MR CARDIAC VELOCITY FLOW MAPPING TECHNIQUE: The patient was scanned on a 1.5 Tesla Siemens magnet. A dedicated cardiac coil was used. Functional imaging was done using TrueFisp sequences. 2,3, and 4 chamber views were done to assess for RWMA's. Modified Simpson's rule using a short axis stack was used to calculate an ejection fraction on a dedicated work [employer]. The patient received 13mL GADAVIST  GADOBUTROL  1 MMOL/ML IV SOLN. After 10 minutes inversion recovery sequences were used to assess for infiltration and scar tissue. Phase contrast velocity encoded images obtained x 2. This examination is tailored for evaluation cardiac anatomy and function and provides very limited assessment of noncardiac structures, which are accordingly not evaluated during interpretation. If there is clinical concern for extracardiac pathology, further evaluation with CT imaging should be considered. FINDINGS: 1. The left ventricle is normal in cavity size. There is mild septal hypertrophy. There is moderate-severe left ventricular systolic dysfunction with an LV ejection fraction calculated at 30%. There are regional wall motion abnormalities. The anterior, anteroseptal, anterolateral walls and apex are all akinetic. Systolic function at the base and inferior wall is relatively preserved. On T2 weighted imaging, there is prominence of the T2 signal in the subendocardium of the anterior and more diffusely in the apex suggestive of myocardial edema in these regions. The native T2 values  in these regions are prolonged further supporting these findings. 2. The right ventricle is normal in cavity size, wall thickness, and systolic function. There are no RV wall motion abnormalities or aneurysms. 3. Both atria are normal in size. 4. The aortic valve is trileaflet in morphology. There is no significant aortic valve stenosis or regurgitation. There is trivial tricuspid regurgitation, but no significant valvular disease. 5. Delayed enhancement imaging is abnormal. The mid-distal septum, mid-distal lateral wall and apex demonstrate near transmural LGE. The  distal septum and apex further demonstrate areas of no-reflow. These regions of myocardium are non-viable. 6. There is a moderate circumferential pericardial effusion. There is systolic collapse of the right atrium, but no evidence of diastolic collapse of the right ventricle or ventircular interdependence to suggest tamponade physiology. The thickness of the pericardium is normal. There does appear to be some enhancement of the pericardium adjacent to the apical lateral wall consistent with pericardial inflammation. 7. The proximal aorta is normal in size and caliber. 8. There appears to be a small apical thrombus seen best on delayed enhancement imaging. There may also be some layering thrombus in the apex as well. 9. Consolidative opacity in the right middle lung space. Unclear if this represents atelectasis vs infiltrate. Recommend dedicated CT imaging of the lung for further evaluation. CONCLUSIONS: Moderate to severe LV systolic dysfunction due to ischemic cardiomyopathy with <50% viability. T2 prominence in the areas of infarct suggest acute myocardial injury as well. There is evidence of apical thrombus due to apical akinesis. Evidence of mild pericarditis limited to the pericardium adjacent to the apex and lateral wall. Moderate pericardial effusion without tamponade. Recommend dedicated CT chest to better evaluate right middle lung  consolidation if clinically appropriate. MEASUREMENTS: LEFT VENTRICLE Left Ventricular End-Diastolic Chamber Size: 3.1 cm Left Ventricular Septal Wall Thickness: 1.3 cm Left Ventricular Posterior Wall Thickness: 0.9 cm Left Ventricular Maximal Wall Thickness: 1.3 cm LV male LV EF: 30% (normal 49-79%) Absolute volumes: LV EDV: (normal 95-215 mL) LV ESV: 77mL (normal 25-85 mL) LV SV: 33mL (normal 61-145 mL) CO: 2.9L/min (normal 3.4-7.8 L/min) Indexed volumes: CI: 1.4L/min/sq-m (normal 1.8-4.2 L/min/sq-m) LV EDV: 58mL/sq-m (normal 50-108 mL/sq-m) LV ESV: 74mL/sq-m (normal 11-47 mL/sq-m) LV SV: 37mL/sq-m (normal 33-72 mL/sq-m) RIGHT VENTRICLE RV male RV EF:  48% (normal 42-72%) Absolute volumes: RV EDV: 86mL (normal 109-217 mL) RV ESV: 45mL (normal 23-91 mL) RV SV: 41mL (normal 71-141 mL) CO: 3.5L/min (normal 2.8-8.8 L/min) Indexed volumes: CI: 1.7L/min/sq-m (normal 1.7-4.2 L/min/sq-m) RV EDV: 84mL/sq-m (normal 58-109 mL/sq-m) RV ESV: 49mL/sq-m (normal 12-46 mL/sq-m) RV SV: 54mL/sq-m (normal 38-71 mL/sq-m) ATRIA Left atrium: Normal size and morphology Right atrium: Normal size and morphology VALVES Aortic valve: The aortic valve is trileaflet without regurgitation or stenosis. Mitral valve: Normal morphology without regurgitation or stenosis. Tricuspid valve: Normal morphology with trivial tricuspid regurgitation. Pulmonic valve: Normal morphology without regurgitation or stenosis. OTHER Consolidative opacity in the right middle lung space. Unclear if this represents atelectasis vs infiltrate. Recommend dedicated CT imaging of the lung for further evaluation. Electronically Signed   By: Georganna Archer   On: 05/22/2024 11:43   MR CARDIAC VELOCITY FLOW MAP Result Date: 05/22/2024 CLINICAL DATA:  Cardiomyopathy suspected Assess for LV thrombus EXAM: MR CARDIA MORPHOLOGY WITHOUT AND WITH CONTRAST; MR CARDIAC VELOCITY FLOW MAPPING TECHNIQUE: The patient was scanned on a 1.5 Tesla Siemens magnet. A dedicated  cardiac coil was used. Functional imaging was done using TrueFisp sequences. 2,3, and 4 chamber views were done to assess for RWMA's. Modified Simpson's rule using a short axis stack was used to calculate an ejection fraction on a dedicated work [employer]. The patient received 13mL GADAVIST  GADOBUTROL  1 MMOL/ML IV SOLN. After 10 minutes inversion recovery sequences were used to assess for infiltration and scar tissue. Phase contrast velocity encoded images obtained x 2. This examination is tailored for evaluation cardiac anatomy and function and provides very limited assessment of noncardiac structures, which are accordingly not evaluated during interpretation. If there is clinical concern  for extracardiac pathology, further evaluation with CT imaging should be considered. FINDINGS: 1. The left ventricle is normal in cavity size. There is mild septal hypertrophy. There is moderate-severe left ventricular systolic dysfunction with an LV ejection fraction calculated at 30%. There are regional wall motion abnormalities. The anterior, anteroseptal, anterolateral walls and apex are all akinetic. Systolic function at the base and inferior wall is relatively preserved. On T2 weighted imaging, there is prominence of the T2 signal in the subendocardium of the anterior and more diffusely in the apex suggestive of myocardial edema in these regions. The native T2 values in these regions are prolonged further supporting these findings. 2. The right ventricle is normal in cavity size, wall thickness, and systolic function. There are no RV wall motion abnormalities or aneurysms. 3. Both atria are normal in size. 4. The aortic valve is trileaflet in morphology. There is no significant aortic valve stenosis or regurgitation. There is trivial tricuspid regurgitation, but no significant valvular disease. 5. Delayed enhancement imaging is abnormal. The mid-distal septum, mid-distal lateral wall and apex demonstrate  near transmural LGE. The distal septum and apex further demonstrate areas of no-reflow. These regions of myocardium are non-viable. 6. There is a moderate circumferential pericardial effusion. There is systolic collapse of the right atrium, but no evidence of diastolic collapse of the right ventricle or ventircular interdependence to suggest tamponade physiology. The thickness of the pericardium is normal. There does appear to be some enhancement of the pericardium adjacent to the apical lateral wall consistent with pericardial inflammation. 7. The proximal aorta is normal in size and caliber. 8. There appears to be a small apical thrombus seen best on delayed enhancement imaging. There may also be some layering thrombus in the apex as well. 9. Consolidative opacity in the right middle lung space. Unclear if this represents atelectasis vs infiltrate. Recommend dedicated CT imaging of the lung for further evaluation. CONCLUSIONS: Moderate to severe LV systolic dysfunction due to ischemic cardiomyopathy with <50% viability. T2 prominence in the areas of infarct suggest acute myocardial injury as well. There is evidence of apical thrombus due to apical akinesis. Evidence of mild pericarditis limited to the pericardium adjacent to the apex and lateral wall. Moderate pericardial effusion without tamponade. Recommend dedicated CT chest to better evaluate right middle lung consolidation if clinically appropriate. MEASUREMENTS: LEFT VENTRICLE Left Ventricular End-Diastolic Chamber Size: 3.1 cm Left Ventricular Septal Wall Thickness: 1.3 cm Left Ventricular Posterior Wall Thickness: 0.9 cm Left Ventricular Maximal Wall Thickness: 1.3 cm LV male LV EF: 30% (normal 49-79%) Absolute volumes: LV EDV: (normal 95-215 mL) LV ESV: 77mL (normal 25-85 mL) LV SV: 33mL (normal 61-145 mL) CO: 2.9L/min (normal 3.4-7.8 L/min) Indexed volumes: CI: 1.4L/min/sq-m (normal 1.8-4.2 L/min/sq-m) LV EDV: 7mL/sq-m (normal 50-108 mL/sq-m) LV  ESV: 59mL/sq-m (normal 11-47 mL/sq-m) LV SV: 59mL/sq-m (normal 33-72 mL/sq-m) RIGHT VENTRICLE RV male RV EF:  48% (normal 42-72%) Absolute volumes: RV EDV: 86mL (normal 109-217 mL) RV ESV: 45mL (normal 23-91 mL) RV SV: 41mL (normal 71-141 mL) CO: 3.5L/min (normal 2.8-8.8 L/min) Indexed volumes: CI: 1.7L/min/sq-m (normal 1.7-4.2 L/min/sq-m) RV EDV: 61mL/sq-m (normal 58-109 mL/sq-m) RV ESV: 12mL/sq-m (normal 12-46 mL/sq-m) RV SV: 48mL/sq-m (normal 38-71 mL/sq-m) ATRIA Left atrium: Normal size and morphology Right atrium: Normal size and morphology VALVES Aortic valve: The aortic valve is trileaflet without regurgitation or stenosis. Mitral valve: Normal morphology without regurgitation or stenosis. Tricuspid valve: Normal morphology with trivial tricuspid regurgitation. Pulmonic valve: Normal morphology without regurgitation or stenosis. OTHER Consolidative opacity in the  right middle lung space. Unclear if this represents atelectasis vs infiltrate. Recommend dedicated CT imaging of the lung for further evaluation. Electronically Signed   By: Georganna Archer   On: 05/22/2024 11:43   DG CHEST PORT 1 VIEW Result Date: 05/21/2024 EXAM: 1 VIEW(S) XRAY OF THE CHEST 05/21/2024 11:52:00 AM COMPARISON: 07/18/2019 CLINICAL HISTORY: Cough FINDINGS: LUNGS AND PLEURA: Low lung volume. Mild right perihilar airspace opacity. This may represent developing infiltrate. No pleural effusion. No pneumothorax. HEART AND MEDIASTINUM: No acute abnormality of the cardiac and mediastinal silhouettes. BONES AND SOFT TISSUES: No acute osseous abnormality. IMPRESSION: 1. Mild right perihilar airspace opacity, which may represent developing infiltrate. Electronically signed by: Oneil Devonshire MD 05/21/2024 07:52 PM EST RP Workstation: [id number]   CARDIAC CATHETERIZATION Result Date: 05/21/2024   Ramus lesion is 40% stenosed.   1st Diag lesion is 80% stenosed.   Prox LAD to Mid LAD lesion is 100% stenosed.   Mid LAD to Dist LAD lesion is  90% stenosed.   A drug-eluting stent was successfully placed using a STENT SYNERGY XD 2.50X28.   A drug-eluting stent was successfully placed using a STENT SYNERGY XD 2.75X16.   Post intervention, there is a 0% residual stenosis.   Post intervention, there is a 0% residual stenosis.   LV end diastolic pressure is moderately elevated.   Recommend uninterrupted dual antiplatelet therapy with Aspirin  81mg  daily and Ticagrelor  90mg  twice daily for a minimum of 12 months (ACS-Class I recommendation). 1.  Late presenting anterior STEMI with total occlusion of the proximal LAD.  Treated with 3 overlapping drug-eluting stents in the LAD (2.5 x 28 mm, 2.5 x 38 mm, 2.75 x 16 mm Synergy DES).  TIMI 0 flow/100% stenosis at baseline, TIMI II flow/0% stenosis post PCI.  Residual high-grade 80% stenosis of the proximal diagonal post PCI. 2.  Mild nonobstructive plaquing in the left main, intermediate branch, left circumflex, and RCA 3.  Moderately elevated LVEDP and clinical presentation consistent with acute systolic heart failure in the setting of late presenting MI, no cardiogenic shock with normal lactate and no vasopressor requirement Recommendations: Post MI medical therapy in the CVICU, aspirin  and ticagrelor  x 12 months, stat 2D echo for assessment of LV/RV function, high risk for heart failure based on amount of myocardium involved in this late presenting anterolateral STEMI   ECHOCARDIOGRAM COMPLETE Result Date: 05/21/2024    ECHOCARDIOGRAM REPORT   Patient Name:   Philip Richardson Date of Exam: 05/21/2024 Medical Rec #:  [id number]      Height:       73.0 in Accession #:    [id number]     Weight:       186.0 lb Date of Birth:  03-05-1956      BSA:          2.086 m Patient Age:    68 years       BP:           105/59 mmHg Patient Gender: M              HR:           82 bpm. Exam Location:  Inpatient Procedure: 2D Echo, Cardiac Doppler, Color Doppler and Intracardiac            Opacification Agent (Both Spectral and Color  Flow Doppler were            utilized during procedure). Indications:    Acute MI  History:  Patient has no prior history of Echocardiogram examinations.                 Risk Factors:Hypertension and Diabetes.  Sonographer:    Tinnie Berke RDCS Referring Phys: [phone] MICHAEL COOPER IMPRESSIONS  1. Left ventricular ejection fraction, by estimation, is 40 to 45%. The left ventricle has mildly decreased function. The left ventricle has no regional wall motion abnormalities. There is mild concentric left ventricular hypertrophy. Left ventricular diastolic parameters are consistent with Grade I diastolic dysfunction (impaired relaxation). Elevated left ventricular end-diastolic pressure.  2. Right ventricular systolic function is normal. The right ventricular size is mildly enlarged.  3. Left atrial size was moderately dilated.  4. The mitral valve is normal in structure. No evidence of mitral valve regurgitation. No evidence of mitral stenosis.  5. The aortic valve is tricuspid. Aortic valve regurgitation is not visualized. No aortic stenosis is present.  6. The inferior vena cava is normal in size with greater than 50% respiratory variability, suggesting right atrial pressure of 3 mmHg. FINDINGS  Left Ventricle: Left ventricular ejection fraction, by estimation, is 40 to 45%. The left ventricle has mildly decreased function. The left ventricle has no regional wall motion abnormalities. The left ventricular internal cavity size was normal in size. There is mild concentric left ventricular hypertrophy. Left ventricular diastolic parameters are consistent with Grade I diastolic dysfunction (impaired relaxation). Elevated left ventricular end-diastolic pressure.  LV Wall Scoring: The mid and distal anterior wall, mid anteroseptal segment, and apex are akinetic. The apical septal segment and apical inferior segment are hypokinetic. The entire lateral wall, inferior wall, basal anteroseptal segment, mid inferoseptal  segment, basal anterior segment, and basal inferoseptal segment are normal. Right Ventricle: The right ventricular size is mildly enlarged. No increase in right ventricular wall thickness. Right ventricular systolic function is normal. Left Atrium: Left atrial size was moderately dilated. Right Atrium: Right atrial size was normal in size. Pericardium: There is no evidence of pericardial effusion. Mitral Valve: The mitral valve is normal in structure. No evidence of mitral valve regurgitation. No evidence of mitral valve stenosis. Tricuspid Valve: The tricuspid valve is normal in structure. Tricuspid valve regurgitation is not demonstrated. No evidence of tricuspid stenosis. Aortic Valve: The aortic valve is tricuspid. Aortic valve regurgitation is not visualized. No aortic stenosis is present. Pulmonic Valve: The pulmonic valve was normal in structure. Pulmonic valve regurgitation is not visualized. No evidence of pulmonic stenosis. Aorta: The aortic root is normal in size and structure. Venous: The inferior vena cava is normal in size with greater than 50% respiratory variability, suggesting right atrial pressure of 3 mmHg. IAS/Shunts: No atrial level shunt detected by color flow Doppler.  LEFT VENTRICLE PLAX 2D LVIDd:         4.60 cm     Diastology LVIDs:         3.60 cm     LV e' medial:    4.35 cm/s LV PW:         1.20 cm     LV E/e' medial:  18.4 LV IVS:        1.30 cm     LV e' lateral:   7.07 cm/s LVOT diam:     2.00 cm     LV E/e' lateral: 11.3 LV SV:         50 LV SV Index:   24 LVOT Area:     3.14 cm LV IVRT:       100 msec  LV Volumes (MOD) LV vol d, MOD A2C: 80.2 ml LV vol d, MOD A4C: 77.6 ml LV vol s, MOD A2C: 31.8 ml LV vol s, MOD A4C: 41.5 ml LV SV MOD A2C:     48.4 ml LV SV MOD A4C:     77.6 ml LV SV MOD BP:      44.4 ml RIGHT VENTRICLE RV S prime:     7.29 cm/s TAPSE (M-mode): 2.4 cm LEFT ATRIUM             Index LA diam:        4.00 cm 1.92 cm/m LA Vol (A2C):   90.8 ml 43.52 ml/m LA Vol (A4C):    66.5 ml 31.87 ml/m LA Biplane Vol: 79.5 ml 38.11 ml/m  AORTIC VALVE LVOT Vmax:   75.10 cm/s LVOT Vmean:  54.600 cm/s LVOT VTI:    0.160 m  AORTA Ao Root diam: 3.00 cm Ao Asc diam:  3.30 cm MITRAL VALVE MV Area (PHT): 5.06 cm    SHUNTS MV Decel Time: 150 msec    Systemic VTI:  0.16 m MV E velocity: 80.10 cm/s  Systemic Diam: 2.00 cm MV A velocity: 44.10 cm/s MV E/A ratio:  1.82 Annabella Scarce MD Electronically signed by Annabella Scarce MD Signature Date/Time: 05/21/2024/11:47:14 AM    Final    US  EKG SITE RITE Result Date: 05/21/2024 If Site Rite image not attached, placement could not be confirmed due to current cardiac rhythm.      I have independently reviewed the above radiology studies  and reviewed the findings with the patient.   Recent Lab Findings: Lab Results  Component Value Date   WBC 8.1 05/27/2024   HGB 9.2 (L) 05/27/2024   HCT 27.0 (L) 05/27/2024   PLT 106 (L) 05/27/2024   GLUCOSE 196 (H) 05/27/2024   CHOL 154 05/21/2024   TRIG 114 05/21/2024   HDL 35 (L) 05/21/2024   LDLDIRECT 113 (H) 04/08/2018   LDLCALC 97 05/21/2024   ALT 343 (H) 05/27/2024   AST 219 (H) 05/27/2024   NA 138 05/27/2024   K 3.8 05/27/2024   CL 100 05/27/2024   CREATININE 2.30 (H) 05/27/2024   BUN 43 (H) 05/27/2024   CO2 25 05/27/2024   TSH 0.582 08/19/2013   INR 1.8 (H) 05/27/2024   HGBA1C 13.1 (H) 05/21/2024         Assessment / Plan:   68 yo male with cardiogenic shock on [facility] ECMO and IABP.  Will plan for 5.5 impella placement.      Linnie MALVA Rayas 05/27/2024 4:03 PM           [1]  Social History Tobacco Use  Smoking Status Never  Smokeless Tobacco Never  [2]  Allergies Allergen Reactions   Atorvastatin  Other (See Comments)    Myopathy/weakness   Invokana  [Canagliflozin ] Other (See Comments)    weakness   Semaglutide  Other (See Comments)    Heartburn   "

## 2024-05-27 NOTE — Plan of Care (Signed)
   Problem: Clinical Measurements: Goal: Ability to maintain clinical measurements within normal limits will improve Outcome: Progressing Goal: Will remain free from infection Outcome: Progressing Goal: Diagnostic test results will improve Outcome: Progressing Goal: Respiratory complications will improve Outcome: Progressing

## 2024-05-28 ENCOUNTER — Inpatient Hospital Stay (HOSPITAL_COMMUNITY)

## 2024-05-28 ENCOUNTER — Encounter (HOSPITAL_COMMUNITY): Payer: Self-pay | Admitting: Internal Medicine

## 2024-05-28 ENCOUNTER — Inpatient Hospital Stay (HOSPITAL_COMMUNITY): Admitting: Certified Registered Nurse Anesthetist

## 2024-05-28 ENCOUNTER — Encounter (HOSPITAL_COMMUNITY): Admission: EM | Disposition: E | Payer: Self-pay | Source: Home / Self Care | Attending: Internal Medicine

## 2024-05-28 DIAGNOSIS — I2102 ST elevation (STEMI) myocardial infarction involving left anterior descending coronary artery: Secondary | ICD-10-CM | POA: Diagnosis not present

## 2024-05-28 DIAGNOSIS — N185 Chronic kidney disease, stage 5: Secondary | ICD-10-CM

## 2024-05-28 DIAGNOSIS — E1122 Type 2 diabetes mellitus with diabetic chronic kidney disease: Secondary | ICD-10-CM

## 2024-05-28 DIAGNOSIS — R570 Cardiogenic shock: Secondary | ICD-10-CM

## 2024-05-28 DIAGNOSIS — I120 Hypertensive chronic kidney disease with stage 5 chronic kidney disease or end stage renal disease: Secondary | ICD-10-CM | POA: Diagnosis not present

## 2024-05-28 DIAGNOSIS — I429 Cardiomyopathy, unspecified: Secondary | ICD-10-CM | POA: Diagnosis not present

## 2024-05-28 DIAGNOSIS — I2510 Atherosclerotic heart disease of native coronary artery without angina pectoris: Secondary | ICD-10-CM

## 2024-05-28 DIAGNOSIS — I5082 Biventricular heart failure: Secondary | ICD-10-CM | POA: Diagnosis not present

## 2024-05-28 HISTORY — PX: PLACEMENT OF IMPELLA LEFT VENTRICULAR ASSIST DEVICE: SHX6519

## 2024-05-28 LAB — POCT I-STAT 7, (LYTES, BLD GAS, ICA,H+H)
Acid-base deficit: 1 mmol/L (ref 0.0–2.0)
Acid-base deficit: 6 mmol/L — ABNORMAL HIGH (ref 0.0–2.0)
Acid-base deficit: 7 mmol/L — ABNORMAL HIGH (ref 0.0–2.0)
Acid-base deficit: 7 mmol/L — ABNORMAL HIGH (ref 0.0–2.0)
Acid-base deficit: 8 mmol/L — ABNORMAL HIGH (ref 0.0–2.0)
Bicarbonate: 22.5 mmol/L (ref 20.0–28.0)
Bicarbonate: 16.8 mmol/L — ABNORMAL LOW (ref 20.0–28.0)
Bicarbonate: 16.4 mmol/L — ABNORMAL LOW (ref 20.0–28.0)
Bicarbonate: 16.9 mmol/L — ABNORMAL LOW (ref 20.0–28.0)
Bicarbonate: 16.4 mmol/L — ABNORMAL LOW (ref 20.0–28.0)
Calcium, Ion: 1.17 mmol/L (ref 1.15–1.40)
Calcium, Ion: 1.18 mmol/L (ref 1.15–1.40)
Calcium, Ion: 1.19 mmol/L (ref 1.15–1.40)
Calcium, Ion: 1.19 mmol/L (ref 1.15–1.40)
Calcium, Ion: 1.2 mmol/L (ref 1.15–1.40)
HCT: 25 % — ABNORMAL LOW (ref 39.0–52.0)
HCT: 27 % — ABNORMAL LOW (ref 39.0–52.0)
HCT: 27 % — ABNORMAL LOW (ref 39.0–52.0)
HCT: 27 % — ABNORMAL LOW (ref 39.0–52.0)
HCT: 26 % — ABNORMAL LOW (ref 39.0–52.0)
Hemoglobin: 8.5 g/dL — ABNORMAL LOW (ref 13.0–17.0)
Hemoglobin: 9.2 g/dL — ABNORMAL LOW (ref 13.0–17.0)
Hemoglobin: 9.2 g/dL — ABNORMAL LOW (ref 13.0–17.0)
Hemoglobin: 9.2 g/dL — ABNORMAL LOW (ref 13.0–17.0)
Hemoglobin: 8.8 g/dL — ABNORMAL LOW (ref 13.0–17.0)
O2 Saturation: 99 %
O2 Saturation: 99 %
O2 Saturation: 94 %
O2 Saturation: 100 %
O2 Saturation: 100 %
Patient temperature: 36.6
Patient temperature: 36.1
Patient temperature: 36.3
Patient temperature: 36.5
Patient temperature: 36.6
Potassium: 3.8 mmol/L (ref 3.5–5.1)
Potassium: 4.7 mmol/L (ref 3.5–5.1)
Potassium: 4.9 mmol/L (ref 3.5–5.1)
Potassium: 4.9 mmol/L (ref 3.5–5.1)
Potassium: 5 mmol/L (ref 3.5–5.1)
Sodium: 142 mmol/L (ref 135–145)
Sodium: 141 mmol/L (ref 135–145)
Sodium: 141 mmol/L (ref 135–145)
Sodium: 141 mmol/L (ref 135–145)
Sodium: 141 mmol/L (ref 135–145)
TCO2: 23 mmol/L (ref 22–32)
TCO2: 18 mmol/L — ABNORMAL LOW (ref 22–32)
TCO2: 17 mmol/L — ABNORMAL LOW (ref 22–32)
TCO2: 18 mmol/L — ABNORMAL LOW (ref 22–32)
TCO2: 17 mmol/L — ABNORMAL LOW (ref 22–32)
pCO2 arterial: 31.1 mmHg — ABNORMAL LOW (ref 32–48)
pCO2 arterial: 24.1 mmHg — ABNORMAL LOW (ref 32–48)
pCO2 arterial: 24.8 mmHg — ABNORMAL LOW (ref 32–48)
pCO2 arterial: 27.5 mmHg — ABNORMAL LOW (ref 32–48)
pCO2 arterial: 28.4 mmHg — ABNORMAL LOW (ref 32–48)
pH, Arterial: 7.466 — ABNORMAL HIGH (ref 7.35–7.45)
pH, Arterial: 7.447 (ref 7.35–7.45)
pH, Arterial: 7.425 (ref 7.35–7.45)
pH, Arterial: 7.393 (ref 7.35–7.45)
pH, Arterial: 7.367 (ref 7.35–7.45)
pO2, Arterial: 126 mmHg — ABNORMAL HIGH (ref 83–108)
pO2, Arterial: 118 mmHg — ABNORMAL HIGH (ref 83–108)
pO2, Arterial: 63 mmHg — ABNORMAL LOW (ref 83–108)
pO2, Arterial: 170 mmHg — ABNORMAL HIGH (ref 83–108)
pO2, Arterial: 173 mmHg — ABNORMAL HIGH (ref 83–108)

## 2024-05-28 LAB — PHOSPHORUS: Phosphorus: 4 mg/dL (ref 2.5–4.6)

## 2024-05-28 LAB — PROTIME-INR
INR: 1.7 — ABNORMAL HIGH (ref 0.8–1.2)
INR: 1.7 — ABNORMAL HIGH (ref 0.8–1.2)
Prothrombin Time: 20.5 s — ABNORMAL HIGH (ref 11.4–15.2)
Prothrombin Time: 20.9 s — ABNORMAL HIGH (ref 11.4–15.2)

## 2024-05-28 LAB — HEPATIC FUNCTION PANEL
ALT: 232 U/L — ABNORMAL HIGH (ref 0–44)
AST: 136 U/L — ABNORMAL HIGH (ref 15–41)
Albumin: 2.5 g/dL — ABNORMAL LOW (ref 3.5–5.0)
Alkaline Phosphatase: 223 U/L — ABNORMAL HIGH (ref 38–126)
Bilirubin, Direct: 1.1 mg/dL — ABNORMAL HIGH (ref 0.0–0.2)
Indirect Bilirubin: 0.4 mg/dL (ref 0.3–0.9)
Total Bilirubin: 1.4 mg/dL — ABNORMAL HIGH (ref 0.0–1.2)
Total Protein: 5.1 g/dL — ABNORMAL LOW (ref 6.5–8.1)

## 2024-05-28 LAB — CBC
HCT: 27.7 % — ABNORMAL LOW (ref 39.0–52.0)
HCT: 29.2 % — ABNORMAL LOW (ref 39.0–52.0)
Hemoglobin: 8.9 g/dL — ABNORMAL LOW (ref 13.0–17.0)
Hemoglobin: 9.4 g/dL — ABNORMAL LOW (ref 13.0–17.0)
MCH: 27.6 pg (ref 26.0–34.0)
MCH: 27.8 pg (ref 26.0–34.0)
MCHC: 32.1 g/dL (ref 30.0–36.0)
MCHC: 32.2 g/dL (ref 30.0–36.0)
MCV: 86 fL (ref 80.0–100.0)
MCV: 86.4 fL (ref 80.0–100.0)
Platelets: 103 K/uL — ABNORMAL LOW (ref 150–400)
Platelets: 82 K/uL — ABNORMAL LOW (ref 150–400)
RBC: 3.22 MIL/uL — ABNORMAL LOW (ref 4.22–5.81)
RBC: 3.38 MIL/uL — ABNORMAL LOW (ref 4.22–5.81)
RDW: 15.6 % — ABNORMAL HIGH (ref 11.5–15.5)
RDW: 15.3 % (ref 11.5–15.5)
WBC: 7.1 K/uL (ref 4.0–10.5)
WBC: 7.7 K/uL (ref 4.0–10.5)
nRBC: 0 % (ref 0.0–0.2)
nRBC: 0 % (ref 0.0–0.2)

## 2024-05-28 LAB — ECHO INTRAOPERATIVE TEE
Height: 73 in
Weight: 3171.1 [oz_av]

## 2024-05-28 LAB — GLUCOSE, CAPILLARY
Glucose-Capillary: 132 mg/dL — ABNORMAL HIGH (ref 70–99)
Glucose-Capillary: 206 mg/dL — ABNORMAL HIGH (ref 70–99)
Glucose-Capillary: 222 mg/dL — ABNORMAL HIGH (ref 70–99)

## 2024-05-28 LAB — CG4 I-STAT (LACTIC ACID)
Lactic Acid, Venous: 0.6 mmol/L (ref 0.5–1.9)
Lactic Acid, Venous: 0.8 mmol/L (ref 0.5–1.9)
Lactic Acid, Venous: 0.6 mmol/L (ref 0.5–1.9)

## 2024-05-28 LAB — FIBRINOGEN
Fibrinogen: 786 mg/dL — ABNORMAL HIGH (ref 210–475)
Fibrinogen: 743 mg/dL — ABNORMAL HIGH (ref 210–475)

## 2024-05-28 LAB — BASIC METABOLIC PANEL WITH GFR
Anion gap: 13 (ref 5–15)
Anion gap: 14 (ref 5–15)
BUN: 43 mg/dL — ABNORMAL HIGH (ref 8–23)
BUN: 40 mg/dL — ABNORMAL HIGH (ref 8–23)
CO2: 23 mmol/L (ref 22–32)
CO2: 18 mmol/L — ABNORMAL LOW (ref 22–32)
Calcium: 8.7 mg/dL — ABNORMAL LOW (ref 8.9–10.3)
Calcium: 8.5 mg/dL — ABNORMAL LOW (ref 8.9–10.3)
Chloride: 105 mmol/L (ref 98–111)
Chloride: 108 mmol/L (ref 98–111)
Creatinine, Ser: 2.11 mg/dL — ABNORMAL HIGH (ref 0.61–1.24)
Creatinine, Ser: 1.91 mg/dL — ABNORMAL HIGH (ref 0.61–1.24)
GFR, Estimated: 33 mL/min — ABNORMAL LOW
GFR, Estimated: 38 mL/min — ABNORMAL LOW
Glucose, Bld: 138 mg/dL — ABNORMAL HIGH (ref 70–99)
Glucose, Bld: 181 mg/dL — ABNORMAL HIGH (ref 70–99)
Potassium: 3.7 mmol/L (ref 3.5–5.1)
Potassium: 4.9 mmol/L (ref 3.5–5.1)
Sodium: 141 mmol/L (ref 135–145)
Sodium: 140 mmol/L (ref 135–145)

## 2024-05-28 LAB — COOXEMETRY PANEL
Carboxyhemoglobin: 1.9 % — ABNORMAL HIGH (ref 0.5–1.5)
Carboxyhemoglobin: 2.5 % — ABNORMAL HIGH (ref 0.5–1.5)
Methemoglobin: 0.8 % (ref 0.0–1.5)
Methemoglobin: <0.7 % (ref 0.0–1.5)
O2 Saturation: 79.3 %
O2 Saturation: 79.7 %
Total hemoglobin: <6.9 g/dL — CL (ref 12.0–16.0)
Total hemoglobin: 9.4 g/dL — ABNORMAL LOW (ref 12.0–16.0)

## 2024-05-28 LAB — APTT
aPTT: 63 s — ABNORMAL HIGH (ref 24–36)
aPTT: 60 s — ABNORMAL HIGH (ref 24–36)

## 2024-05-28 LAB — MAGNESIUM: Magnesium: 1.9 mg/dL (ref 1.7–2.4)

## 2024-05-28 LAB — LACTATE DEHYDROGENASE: LDH: 654 U/L — ABNORMAL HIGH (ref 105–235)

## 2024-05-28 MED ORDER — MIDAZOLAM HCL (PF) 2 MG/2ML IJ SOLN
2.0000 mg | INTRAMUSCULAR | Status: AC
  Administered 2024-05-28: 2 mg via INTRAVENOUS

## 2024-05-28 MED ORDER — FENTANYL CITRATE (PF) 50 MCG/ML IJ SOSY
PREFILLED_SYRINGE | INTRAMUSCULAR | Status: AC
  Filled 2024-05-28: qty 1

## 2024-05-28 MED ORDER — PROPOFOL 10 MG/ML IV BOLUS
INTRAVENOUS | Status: AC
  Filled 2024-05-28: qty 20

## 2024-05-28 MED ORDER — ORAL CARE MOUTH RINSE: 15.0000 mL | OROMUCOSAL | Status: DC | PRN

## 2024-05-28 MED ORDER — PROTAMINE SULFATE 10 MG/ML IV SOLN
INTRAVENOUS | Status: AC
  Filled 2024-05-28: qty 10

## 2024-05-28 MED ORDER — FENTANYL CITRATE (PF) 50 MCG/ML IJ SOSY
50.0000 ug | PREFILLED_SYRINGE | INTRAMUSCULAR | Status: DC | PRN
  Administered 2024-05-29 – 2024-05-30 (×4): 50 ug via INTRAVENOUS
  Filled 2024-05-28 (×4): qty 1

## 2024-05-28 MED ORDER — HEPARIN 6000 UNIT IRRIGATION SOLUTION
Status: AC
  Filled 2024-05-28: qty 500

## 2024-05-28 MED ORDER — SODIUM BICARBONATE 8.4 % IV SOLN
INTRAVENOUS | Status: DC
  Filled 2024-05-28: qty 25

## 2024-05-28 MED ORDER — LACTULOSE 10 GM/15ML PO SOLN
30.0000 g | Freq: Once | ORAL | Status: DC
  Filled 2024-05-28: qty 45

## 2024-05-28 MED ORDER — ONDANSETRON HCL 4 MG/2ML IJ SOLN
INTRAMUSCULAR | Status: DC | PRN
  Administered 2024-05-28: 4 mg via INTRAVENOUS

## 2024-05-28 MED ORDER — ETOMIDATE 2 MG/ML IV SOLN
INTRAVENOUS | Status: DC | PRN
  Administered 2024-05-28: 20 mg via INTRAVENOUS

## 2024-05-28 MED ORDER — MIDAZOLAM HCL 2 MG/2ML IJ SOLN
INTRAMUSCULAR | Status: AC
  Filled 2024-05-28: qty 2

## 2024-05-28 MED ORDER — HEPARIN SODIUM (PORCINE) 1000 UNIT/ML IJ SOLN
INTRAMUSCULAR | Status: DC | PRN
  Administered 2024-05-28: 5000 [IU] via INTRAVENOUS
  Administered 2024-05-28: 3000 [IU] via INTRAVENOUS

## 2024-05-28 MED ORDER — HEPARIN SODIUM (PORCINE) 1000 UNIT/ML IJ SOLN
INTRAMUSCULAR | Status: AC
  Filled 2024-05-28: qty 20

## 2024-05-28 MED ORDER — PROTAMINE SULFATE 10 MG/ML IV SOLN
INTRAVENOUS | Status: DC | PRN
  Administered 2024-05-28: 80 mg via INTRAVENOUS

## 2024-05-28 MED ORDER — HEMOSTATIC AGENTS (NO CHARGE) OPTIME
TOPICAL | Status: DC | PRN
  Administered 2024-05-28 (×4): 1 via TOPICAL

## 2024-05-28 MED ORDER — 0.9 % SODIUM CHLORIDE (POUR BTL) OPTIME
TOPICAL | Status: DC | PRN
  Administered 2024-05-28: 2000 mL

## 2024-05-28 MED ORDER — ROCURONIUM BROMIDE 10 MG/ML (PF) SYRINGE
PREFILLED_SYRINGE | INTRAVENOUS | Status: DC | PRN
  Administered 2024-05-28: 10 mg via INTRAVENOUS
  Administered 2024-05-28: 70 mg via INTRAVENOUS
  Administered 2024-05-28 (×2): 10 mg via INTRAVENOUS

## 2024-05-28 MED ORDER — SUGAMMADEX SODIUM 200 MG/2ML IV SOLN
INTRAVENOUS | Status: DC | PRN
  Administered 2024-05-28: 200 mg via INTRAVENOUS

## 2024-05-28 MED ORDER — FENTANYL CITRATE (PF) 250 MCG/5ML IJ SOLN
INTRAMUSCULAR | Status: AC
  Filled 2024-05-28: qty 5

## 2024-05-28 MED ORDER — SODIUM CHLORIDE 0.9 % IV SOLN: INTRAVENOUS | Status: DC | PRN

## 2024-05-28 MED ORDER — MAGNESIUM SULFATE 2 GM/50ML IV SOLN
2.0000 g | Freq: Once | INTRAVENOUS | Status: AC
  Administered 2024-05-28: 2 g via INTRAVENOUS
  Filled 2024-05-28: qty 50

## 2024-05-28 MED ORDER — ORAL CARE MOUTH RINSE
15.0000 mL | OROMUCOSAL | Status: DC
  Administered 2024-05-29 (×5): 15 mL via OROMUCOSAL

## 2024-05-28 MED ORDER — FENTANYL CITRATE (PF) 250 MCG/5ML IJ SOLN
INTRAMUSCULAR | Status: DC | PRN
  Administered 2024-05-28: 250 ug via INTRAVENOUS

## 2024-05-28 MED ORDER — ROCURONIUM BROMIDE 10 MG/ML (PF) SYRINGE
PREFILLED_SYRINGE | INTRAVENOUS | Status: AC
  Filled 2024-05-28: qty 10

## 2024-05-28 MED ORDER — DEXAMETHASONE SOD PHOSPHATE PF 10 MG/ML IJ SOLN
INTRAMUSCULAR | Status: DC | PRN
  Administered 2024-05-28: 5 mg via INTRAVENOUS

## 2024-05-28 MED ORDER — MIDAZOLAM HCL (PF) 5 MG/ML IJ SOLN
INTRAMUSCULAR | Status: DC | PRN
  Administered 2024-05-28 (×2): 1 mg via INTRAVENOUS

## 2024-05-28 MED ORDER — POTASSIUM CHLORIDE 20 MEQ PO PACK
40.0000 meq | PACK | Freq: Once | ORAL | Status: AC
  Administered 2024-05-28: 40 meq
  Filled 2024-05-28: qty 2

## 2024-05-28 MED ORDER — FENTANYL CITRATE (PF) 50 MCG/ML IJ SOSY
50.0000 ug | PREFILLED_SYRINGE | Freq: Once | INTRAMUSCULAR | Status: AC
  Administered 2024-05-28: 50 ug via INTRAVENOUS

## 2024-05-28 MED ORDER — HEPARIN 6000 UNIT IRRIGATION SOLUTION
Status: DC | PRN
  Administered 2024-05-28: 1

## 2024-05-28 MED ORDER — ONDANSETRON HCL 4 MG/2ML IJ SOLN
INTRAMUSCULAR | Status: AC
  Filled 2024-05-28: qty 2

## 2024-05-28 MED ORDER — MIDAZOLAM HCL (PF) 2 MG/2ML IJ SOLN
2.0000 mg | INTRAMUSCULAR | Status: DC | PRN
  Administered 2024-05-28 – 2024-05-30 (×6): 2 mg via INTRAVENOUS
  Filled 2024-05-28 (×8): qty 2

## 2024-05-28 NOTE — Anesthesia Preprocedure Evaluation (Addendum)
"                                    Anesthesia Evaluation  Patient identified by MRN, date of birth, ID bandGeneral Assessment Comment:Pt sedated  Reviewed: Allergy & Precautions, NPO status , Patient's Chart, lab work & pertinent test results, Unable to perform ROS - Chart review only  History of Anesthesia Complications Negative for: history of anesthetic complications  Airway Mallampati: II  TM Distance: >3 FB Neck ROM: Full    Dental  (+) Edentulous Upper, Edentulous Lower   Pulmonary neg pulmonary ROS   breath sounds clear to auscultation       Cardiovascular hypertension, Pt. on medications + CAD, + Past MI and + Cardiac Stents (05/21/2024 LAD)   Rhythm:Irregular Rate:Normal  ECMO Now for Impella to assist ECMO wean   Neuro/Psych sedated    GI/Hepatic Neg liver ROS,GERD  ,,Post-pyloric feeding tube, stopped 7a   Endo/Other  diabetes (glu 132), Oral Hypoglycemic Agents, Insulin  Dependent    Renal/GU Renal Insufficiency and ARFRenal diseaseH/o stones     Musculoskeletal   Abdominal   Peds  Hematology Hb 8.9, plt 103k INR 1.7   Anesthesia Other Findings 12/18: LHC 100% p LAD treated with 3 overlapping DES, residual 90% p diagonal post PCI. LVEDP 24 mmHg. 12/19 IABP placed for progressive shock 12/20 Cannulated for [facility] ECMO   Remains on VA ECMO with IABP vent. Had severe SIRS response initially with VA ECMO but improved methylene blue  and adjustment of drips   Failed ECMO wean yesterday no back flowing 4L on epi 3 NE 5 TEE today to see if LV apical clot no longer present, will plan Impella 5.5 to help wean   Reproductive/Obstetrics                              Anesthesia Physical Anesthesia Plan  ASA: 4  Anesthesia Plan: General   Post-op Pain Management: Minimal or no pain anticipated   Induction: Intravenous  PONV Risk Score and Plan: Treatment may vary due to age or medical condition  Airway Management  Planned: Oral ETT  Additional Equipment: Arterial line, PA Cath and TEE  Intra-op Plan:   Post-operative Plan: Post-operative intubation/ventilation  Informed Consent: I have reviewed the patients History and Physical, chart, labs and discussed the procedure including the risks, benefits and alternatives for the proposed anesthesia with the patient or authorized representative who has indicated his/her understanding and acceptance.     Consent reviewed with POA  Plan Discussed with: CRNA and Surgeon  Anesthesia Plan Comments:          Anesthesia Quick Evaluation  "

## 2024-05-28 NOTE — Plan of Care (Addendum)
" °  Problem: Clinical Measurements: Goal: Ability to maintain clinical measurements within normal limits will improve Outcome: Progressing Goal: Will remain free from infection Outcome: Progressing Goal: Diagnostic test results will improve Outcome: Progressing Notified by lab at 0524 05/28/2024, Hg 6.9 Critical value on Coox reading. ECMO specialist notified 0530 05/28/2024. Informed that another pt had the same paradoxical results. CBC reading 8.9. No signs of bleeding.  Goal: Respiratory complications will improve Outcome: Progressing Goal: Cardiovascular complication will be avoided Outcome: Progressing   Problem: Activity: Goal: Risk for activity intolerance will decrease Outcome: Progressing   Problem: Nutrition: Goal: Adequate nutrition will be maintained Outcome: Progressing   Problem: Ischemic Stroke/TIA Tissue Perfusion: Goal: Complications of ischemic stroke/TIA will be minimized Outcome: Progressing   "

## 2024-05-28 NOTE — Op Note (Signed)
" °   °  [address] [name] [address]             [phone]                                         05/28/2024 Patient:  Philip Richardson Pre-Op Dx:  Cardiogenic Shock   S/p STEMI Post-op Dx:  same Procedure: Right axillary artery cannulation with 10 mm graft Insertion of 5.5 Impella  Surgeon and Role:      * Aarvi Stotts, Linnie KIDD, MD - Primary    * Rolan Fuel, MD - Co-surgeon  Anesthesia  general EBL:  Blood Administration: 1 unit pRBCs  Counts: correct   Indications: Philip Richardson 68 y.o. male admitted to the hospital on 12/18 with a STEMI.  He was taken to the cath lab for PCI to the LAD with DES.  He remained unstable with continued chest pain, and thus IABP was placed.  While in the ICU, he developed progressive cardiogenic shock, and thus was pace on [place] ECMO on 12/20.  He has failed weaning, and thus CT has been consulted for impella placement.    Findings: Normal anatomy  Operative Technique: All invasive lines were placed in pre-op holding.  After the risks, benefits and alternatives were thoroughly discussed, the patient was brought to the operative theatre.  Anesthesia was induced, and the patient was prepped and draped in normal sterile fashion.  An appropriate surgical pause was performed, and pre-operative antibiotics were dosed accordingly.  We began with an incision over the right deltopectoral groove.  This was carried down with a combination of Bovie cautery and blunt dissection until we reached the right axillary artery.  Patient was then systemically heparinized, and the axillary artery was encircled and clamped.  An arteriotomy was created and an 10 mm graft was sewn to the axillary artery in an end-to-side fashion.  This would serve as our arterial cannulation site.  The peel away sheath was inserted into the end of the graft and fastened with the sleeve connectors to provide hemostasis. A .035 J-wire was advanced through the  sheath into the ascending aorta under fluoroscopic guidance usin C-arm. A pig tail catheter was inserted over the wire and then advanced across the aortic valve without difficulty to the LV apex. The J-wire was changed out for 0.18 wire and it was positioned with a gentle curve in the LV apex. The pigtail was removed. Then the graft was clamped adjacent to the anastomosis and the 5.5 Impella inserted through the sheath. Before full insertion it was burped by removing the clamp on the graft temporarily. The Impella was advanced fully into the graft and the clamp removed. The Impella was advanced into the LV and the wire removed. Position was confirmed with TEE with the tip 4.5 cm below the aortic valve. The graft was again clamped and the graft was cut flush with the skin. The sheath was inserted into the end of the graft and it was fastened with 1-0 silk ties for hemostasis. The sheath was attached to the skin with 0-silk sutures.  The wound was hemostatic.  The soft tissue and skin were re-approximated wth absorbable suture.    The patient tolerated the procedure without any immediate complications, and was transferred to the ICU in guarded condition.  [name] O [name]  "

## 2024-05-28 NOTE — CV Procedure (Signed)
 ECMO DAILY NOTE:   Indication: Cardiogenic shock   Initial cannulation date: 05/23/24  ECMO day #6   ECMO type: [facility] ECMO   1) 25 FR multi-stage venous drainage in R CFV 2) 19 FR return cannula in L CFA     Daily data:   Flow 4.0 RPM 3500 Sweep  0.5L  Blender 80%   Labs:   ABG    Component Value Date/Time   PHART 7.466 (H) 05/28/2024 0059   PCO2ART 31.1 (L) 05/28/2024 0059   PO2ART 126 (H) 05/28/2024 0059   HCO3 22.5 05/28/2024 0059   TCO2 23 05/28/2024 0059   ACIDBASEDEF 1.0 05/28/2024 0059   O2SAT 79.3 05/28/2024 0500    PTT: 63 on bival Hgb 8.9 PLTs 103 LDH 654   Plan:  Failed ECMO wean. Cardiac outputs remain low. Increase pressors.  TEE without LV apical clot Impella 5.5 today     Toribio Fuel, MD  8:49 AM

## 2024-05-28 NOTE — Anesthesia Procedure Notes (Signed)
 Procedure Name: Intubation Date/Time: 05/28/2024 11:39 AM  Performed by: Cindie Donald CROME, CRNAPre-anesthesia Checklist: Patient identified, Emergency Drugs available, Suction available and Patient being monitored Patient Re-evaluated:Patient Re-evaluated prior to induction Oxygen Delivery Method: Circle System Utilized Preoxygenation: Pre-oxygenation with 100% oxygen Induction Type: IV induction Ventilation: Mask ventilation without difficulty and Oral airway inserted - appropriate to patient size Laryngoscope Size: Mac and 4 Grade View: Grade I Tube type: Oral Tube size: 7.5 mm Number of attempts: 1 Airway Equipment and Method: Stylet and Oral airway Placement Confirmation: ETT inserted through vocal cords under direct vision, positive ETCO2 and breath sounds checked- equal and bilateral Secured at: 22 cm Tube secured with: Tape Dental Injury: Teeth and Oropharynx as per pre-operative assessment

## 2024-05-28 NOTE — Progress Notes (Signed)
 "    Advanced Heart Failure Rounding Note  Cardiologist: None   [facility] Cardiologist: Dr. Rolan  Chief Complaint: Late presenting anterior MI, acute CHF Patient Profile   Philip Richardson is a 68 y.o. male with history of poorly controlled DM II, HTN, HLD and CKD IIIb. Admitted with late presenting anterior STEMI and acute systolic CHF.  Significant events:   12/18: LHC 100% p LAD treated with 3 overlapping DES, residual 90% p diagonal post PCI. LVEDP 24 mmHg. 12/19 IABP placed for progressive shock 12/20 Cannulated for [facility] ECMO  12/24 TEE   Subjective:    Remains on VA ECMO with IABP vent. Had severe SIRS response initially with VA ECMO but improved methylene blue  and adjustment of drips  Failed ECMO wean past 2 days.   Now on NE 12 epi 2.5  Cardiac index 1.4 co-ox 79%  LA 0.5   Back in SR on IV amio  PAP: (11-32)/(-5-26) 18/9 CVP:  [0 mmHg-57 mmHg] 2 mmHg PCWP:  [12 mmHg] 12 mmHg CO:  [3.1 L/min-4.8 L/min] 3.1 L/min CI:  [1.4 L/min/m2-2.2 L/min/m2] 1.4 L/min/m2   Objective:    89.9 kg Body mass index is 26.15 kg/m.   Vital Signs:   Temp:  [94.5 F (34.7 C)-98.2 F (36.8 C)] 97.9 F (36.6 C) (12/25 0815) Pulse Rate:  [59-121] 73 (12/25 0815) Resp:  [8-41] 19 (12/25 0815) SpO2:  [86 %-100 %] 98 % (12/25 0815) Arterial Line BP: (117-201)/(41-160) 174/63 (12/25 0815) Weight:  [89.9 kg] 89.9 kg (12/25 0530) Last BM Date : 05/24/24  Weight change: Filed Weights   05/26/24 0500 05/27/24 0500 05/28/24 0530  Weight: 94.1 kg 91.5 kg 89.9 kg    Intake/Output:   Intake/Output Summary (Last 24 hours) at 05/28/2024 0850 Last data filed at 05/28/2024 0800 Gross per 24 hour  Intake 2645.18 ml  Output 3975 ml  Net -1329.82 ml     Physical Exam   General:  Lying in bed. No resp difficulty HEENT: normal Neck: supple.RIJ swan Cor: Regular rate & rhythm.  Lungs: clear Abdomen: soft, nontender, nondistended.Good bowel sounds. Extremities: no cyanosis, clubbing,  rash, 1+ edema ECMO and IABP sites ok  Neuro: alert and oriented. Non-focal   Telemetry   Sinus 70s Personally reviewed  Labs   CBC Recent Labs    05/27/24 1613 05/27/24 1739 05/28/24 0059 05/28/24 0411  WBC 6.6  --   --  7.1  HGB 8.9*   < > 8.5* 8.9*  HCT 27.6*   < > 25.0* 27.7*  MCV 85.2  --   --  86.0  PLT 106*  --   --  103*   < > = values in this interval not displayed.   Basic Metabolic Panel Recent Labs    87/75/74 0400 05/27/24 0723 05/27/24 1613 05/27/24 1739 05/28/24 0059 05/28/24 0411  NA 139   < > 139   < > 142 141  K 4.0   < > 3.7   < > 3.8 3.7  CL 100  --  102  --   --  105  CO2 25  --  24  --   --  23  GLUCOSE 196*  --  188*  --   --  138*  BUN 43*  --  44*  --   --  43*  CREATININE 2.30*  --  2.27*  --   --  2.11*  CALCIUM  8.5*  --  8.6*  --   --  8.7*  MG  1.9  --   --   --   --  1.9  PHOS 4.4  --  4.2  --   --  4.0   < > = values in this interval not displayed.   Liver Function Tests Recent Labs    05/27/24 0400 05/28/24 0411  AST 219* 136*  ALT 343* 232*  ALKPHOS 173* 223*  BILITOT 1.4* 1.4*  PROT 5.0* 5.1*  ALBUMIN  2.5* 2.5*   No results for input(s): LIPASE, AMYLASE in the last 72 hours. Cardiac Enzymes Recent Labs    05/25/24 1118  CKTOTAL 374    BNP: BNP (last 3 results) No results for input(s): BNP in the last 8760 hours.  ProBNP (last 3 results) Recent Labs    05/21/24 1855  PROBNP 18,891.0*     D-Dimer No results for input(s): DDIMER in the last 72 hours. Hemoglobin A1C No results for input(s): HGBA1C in the last 72 hours.  Fasting Lipid Panel No results for input(s): CHOL, HDL, LDLCALC, TRIG, CHOLHDL, LDLDIRECT in the last 72 hours.  Medications:   Scheduled Medications:  aspirin   81 mg Per Tube Daily   bisacodyl   10 mg Rectal Daily   Chlorhexidine  Gluconate Cloth  6 each Topical Q0600   clopidogrel   75 mg Per Tube Daily   feeding supplement (PROSource TF20)  60 mL Per Tube Daily    insulin  aspart  0-15 Units Subcutaneous Q4H   lidocaine   1 patch Transdermal Q24H   melatonin  3 mg Per Tube QHS   metoCLOPramide  (REGLAN ) injection  5 mg Intravenous Q8H   multivitamin with minerals  1 tablet Oral Daily   pantoprazole  (PROTONIX ) IV  40 mg Intravenous QHS   propofol   50 mg Intravenous Once   QUEtiapine   50 mg Per Tube BID   rOPINIRole   0.25 mg Per Tube Daily   sodium chloride  flush  10-40 mL Intracatheter Q12H   thiamine   100 mg Per Tube Daily    Infusions:  sodium chloride      albumin  human Stopped (05/23/24 1130)   amiodarone  60 mg/hr (05/28/24 0802)   bivalirudin  (ANGIOMAX ) 250 mg in sodium chloride  0.9 % 500 mL (0.5 mg/mL) infusion 0.02 mg/kg/hr (05/28/24 0800)   dexmedetomidine  (PRECEDEX ) IV infusion 0.4 mcg/kg/hr (05/28/24 0800)   epinephrine  2.5 mcg/min (05/28/24 0800)   feeding supplement (VITAL 1.5 CAL) Stopped (05/28/24 0715)   meropenem  (MERREM ) IV Stopped (05/27/24 2241)   milrinone  Stopped (05/26/24 1341)   norepinephrine  (LEVOPHED ) Adult infusion 12 mcg/min (05/28/24 0800)   vancomycin  Stopped (05/27/24 1721)   vasopressin  Stopped (05/24/24 0928)    PRN Medications: sodium chloride , acetaminophen  (TYLENOL ) oral liquid 160 mg/5 mL, acetaminophen , albumin  human, guaiFENesin -dextromethorphan , ondansetron  (ZOFRAN ) IV, mouth rinse, phenol  Assessment/Plan   1. CAD: Late-presenting anterior STEMI, symptoms began on 12/14.  Cath was done showing occluded LAD that was treated with overlapping DES x 3.  There was residual 80% D1. Symptoms had a pleuritic character pre-cath, and he continues to have significant pleuritic chest pain (not improved by PCI).  ECG post-cath showed persistent anterior STE.  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  No significant pericardial effusion present.  - Continue ASA 81 and ticagrelor .  - Continue Crestor  10 mg daily, has not been able to tolerate statins in the past so will start low dose.  - Continue colchicine   0.6 mg daily (renal dosing) given concern for post-infarct pericarditis, morphine  prn. Pain resolved. Only small effusion on POCUS echo  - No s/s angina 2. Acute  systolic CHF: Ischemic cardiomyopathy. -> cardiogenic shock SCAI stage D - POCUS echo EF 25% - cMRI 30% + LV clot - Now on [facility] ECMO (12/20) with IABP vent.  - Failed ECMO wean - does not tolerate afterlaad reduction with milrinone  and /or DBA - TEE 12/24 EF < 20% RV severely down. No apical clot - Will plan Impella 5.5 placement today with IABP removal. Possible ECMO decannulation on 12/26   3. AKI on CKD stage 3b: Suspect diabetic nephropathy. Creatinine 2.2>2.7 (baseline appears to be low 2s) - Renal function improving with support Scr now 1.97  - no need for CVVHD at this point. 4. AF with RVR - continue IV amio and bival - back in SR. Continue IV amio  4. Type 2 DM: Poor control with HgbA1c 13.1.   - improving 5. LV thrombus on cMRI  - on bival  - resolved on TEE 12/24 6. Elevated LFTs and LDH - concerning for hemolysis ECMO and IABP adjusted. Numbers improved 7. TIA - code stroke on 12/22 - CT ok. Symptoms now resolved.   CRITICAL CARE Performed by: Cherrie Sieving  Total critical care time: 55 minutes  Critical care time was exclusive of separately billable procedures and treating other patients.  Critical care was necessary to treat or prevent imminent or life-threatening deterioration.  Critical care was time spent personally by me (independent of midlevel providers or residents) on the following activities: development of treatment plan with patient and/or surrogate as well as nursing, discussions with consultants, evaluation of patient's response to treatment, examination of patient, obtaining history from patient or surrogate, ordering and performing treatments and interventions, ordering and review of laboratory studies, ordering and review of radiographic studies, pulse oximetry and re-evaluation of  patient's condition.   Length of Stay: 7  Sieving Cherrie, MD  05/28/2024, 8:50 AM  Advanced Heart Failure Team Pager [phone] (M-F; 7a - 5p)   Please visit [email or web address]: For overnight coverage please call cardiology fellow first. If fellow not available call Shock/ECMO MD on call.  For ECMO / Mechanical Support (Impella, IABP, LVAD) issues call Shock / ECMO MD on call.      "

## 2024-05-28 NOTE — Anesthesia Procedure Notes (Signed)
 Arterial Line Insertion Start/End12/25/2025 11:42 AM, 05/28/2024 11:45 AM Performed by: Leonce Athens, MD, Cindie Donald CROME, CRNA, anesthesiologist  Patient location: OR. Preanesthetic checklist: patient identified, IV checked, site marked, risks and benefits discussed, surgical consent, monitors and equipment checked, pre-op evaluation, timeout performed and anesthesia consent Lidocaine  1% used for infiltration and patient sedated Left, radial was placed Catheter size: 20 G Hand hygiene performed  and maximum sterile barriers used   Attempts: 1 Following insertion, dressing applied and Biopatch. Post procedure assessment: normal and unchanged  Patient tolerated the procedure well with no immediate complications.

## 2024-05-28 NOTE — Progress Notes (Signed)
" °   °  [address] [place] [address]             [phone]       No events  Vitals:   05/28/24 0945 05/28/24 1000  BP:    Pulse: 71 72  Resp: 15 (!) 8  Temp: (!) 97.5 F (36.4 C) 97.7 F (36.5 C)  SpO2: 100% 100%   Arousable sinus EWOB   OR today for axillary cannulation, impella placement  Leeman Johnsey O Clifton Kovacic   "

## 2024-05-28 NOTE — Progress Notes (Signed)
 "  NAME:  Philip Richardson, MRN:  [id number], DOB:  03-22-1956, LOS: 7 ADMISSION DATE:  05/21/2024, CONSULTATION DATE:  12/20 REFERRING MD:  Bensimhon, AHF CHIEF COMPLAINT:  cardiogenic shock    History of Present Illness:  68 year old male with past medical history of poorly controlled T2DM, GERD, HTN, HLD, CKD IIIb admitted 12/18 after presenting as code STEMI. Began having chest pain on Sunday (12/14) which resolved and then recurred.   Cath showed LAD occlusion tx with DES x 3 + IABP due to high LVEDP.  Persistent pain and cardiogenic shock post cath .  Overnight progressive shock so taken to cath lab.  IABP placed but no improvement so cannulated for VA ECMO.   PCCM consulted to assist with management.  Pertinent  Medical History  DM2 HTN HLD CKD  Significant Hospital Events: Including procedures, antibiotic start and stop dates in addition to other pertinent events   12/18-DES x 3 IABP 12/19 RHC: On NE 15, milrinone  0.375 and VP 0.02 Findings: Ao =81/58 (65) RA = 12 RV = 17/14 PA =  24/18 (20) PCW = 22 Fick cardiac output/index = 4.4/2.1 Thermo CO/CI =4.6/2.2 SVR = 770  Ao sat = 99% PA sat = 60%, 62% PAPi = 0.5 ECMO cannulation; not impella candidate d/t LV clot so using IABP to vent LV 12/21 Flow at 4.7lpm, sweep 0.6; lactate cleared. Bedside POCUS 20-25% CVP 7. Back in NSR. Pressor requirements much improvd. Only on epi. Had been on NE, VPA and methylene blue  12/22 remain on [place]  ECMO with flow 3.5 L, on IABP 1:1.  Came off of pressors overnight.  LDH is trending up.  Remained in sinus rhythm 12/23 patient remained afebrile, came off of vasopressor support, remained on [place] ECMO with 3.5 L flow, tolerated coming down on [place] ECMO weaning to 1.5, became hypotensive after starting dobutamine  and required Levophed  infusion 12/24 patient remained in A-fib with controlled rate, started on amiodarone  infusion.  Continue to require vasopressors but improved from yesterday, on 4 mics of  Levophed , epinephrine  2.5  Interim History / Subjective:  Patient remained afebrile Converted back to sinus rhythm Remain on amiodarone  at 60, on epinephrine  at 2.5 and norepinephrine  at 11 mics  Objective   Blood pressure (!) 86/60, pulse 70, temperature 97.7 F (36.5 C), resp. rate (!) 22, height 6' 1 (1.854 m), weight 89.9 kg, SpO2 98%. PAP: (16-28)/(5-19) 21/12 CVP:  [0 mmHg-10 mmHg] 6 mmHg PCWP:  [12 mmHg] 12 mmHg CO:  [3.1 L/min-4.8 L/min] 3.1 L/min CI:  [1.4 L/min/m2-2.2 L/min/m2] 1.4 L/min/m2      Intake/Output Summary (Last 24 hours) at 05/28/2024 1508 Last data filed at 05/28/2024 1414 Gross per 24 hour  Intake 3325.35 ml  Output 3620 ml  Net -294.65 ml   Filed Weights   05/26/24 0500 05/27/24 0500 05/28/24 0530  Weight: 94.1 kg 91.5 kg 89.9 kg    Examination: General: Acute on chronically ill-appearing elderly male, lying on the bed HEENT: [place]/AT, eyes anicteric.  moist mucus membranes Neuro: Eyes open, confused, following simple commands Chest: Coarse breath sounds, no wheezes or rhonchi Heart: Regular rate and rhythm, no murmurs or gallops Abdomen: Soft, nontender, nondistended, bowel sounds present Extremities: ECMO cannula and IABP in place, currently on 1:3  Labs and images reviewed  Patient Lines/Drains/Airways Status     Active Line/Drains/Airways     Name Placement date Placement time Site Days   Arterial Line 05/22/24 Right Radial 05/22/24  2229  Radial  6   Arterial  Line 05/28/24 Left Radial 05/28/24  1142  Radial  less than 1   Peripheral IV 05/21/24 18 G Left Antecubital 05/21/24  0840  Antecubital  7   PICC Double Lumen 05/21/24 Right Brachial 45 cm 0 cm 05/21/24  1845  -- 7   IABP 9.0 Fr. 50 mL 05/22/24  2330  Right femoral  6   Sheath 05/23/24 Right Femoral 05/23/24  0400  Femoral  5   Sheath 05/23/24 Right Internal Jugular 05/23/24  0400  Internal Jugular  5   Impella 05/28/24  1344  -- less than 1   ECMO Drainage Single Lumen Cannula  05/23/24  0630  -- 5   ECMO Return Single Lumen Cannula 05/23/24  0630  -- 5   Urethral Catheter Johnnie Aid, RN Latex 14 Fr. 05/23/24  0658  Latex  5   Airway 7.5 mm 05/28/24  1139  -- less than 1   Pulmonary Artery Catheter 05/22/24 Right 05/22/24  2239  -- 6   Wound 05/28/24 1228 Surgical Closed Surgical Incision Axilla Right 05/28/24  1228  Axilla  less than 1            Resolved Hospital Problem list     Assessment & Plan:  Coronary artery disease with late presenting anterior STEMI s/p overlapping DES x 3 Acute HFrEF due to ischemic cardiomyopathy with cardiogenic shock status post IABP 12/19 and VA ECMO cannulation on 12/20  Post MI pericarditis LV thrombus Continue  aspirin  and Brilinta  Continue Crestor  Continue colchicine  for possible post MI pericarditis Echocardiogram showed EF 25%, cardiac MRI is positive for LV thrombus but TEE was done on 12/24 at which showed no LV thrombus Plan for Impella insertion 5.5 today Remain on VA ECMO 4 L flow Remain on low-dose vasopressor support with epinephrine  and Levophed  LDH and LFTs continue to improve TCTS and heart failure teams are following Continue empiric vancomycin  and meropenem  Continue Bival infusion Coox 79%   Paroxysmal A-fib with RVR Converted back to sinus rhythm Continue amiodarone  infusion, currently on 60 mg/h Continue Bival for stroke prophylaxis  AKI on CKD3b Serum creatinine continue to improve, down to 2.1 from 2.3 yesterday Good urine output about 3.8 L with net -1.3 L Monitor intake and output Avoid nephrotoxic agent Repeat BMP in the morning  Delirium Continue Seroquel  and melatonin Continue low-dose Precedex  for now, currently at 0.4 mics per KG  Acute hypoxemic respiratory failure due to pulmonary edema He on 4 L nasal cannula oxygen Encourage incentive spirometry  ABLA expected post cannulation Thrombocytopenia critical illness H&H is stable, monitor and transfuse if less than  8 Platelet count are stable between 100-110  TIA Patient had gaze deviation and hemiplegia, stroke code was called on 12/22 Neurological symptoms improved within 1 hour Head CT showed no neurological changes On DAPT and statin Also on bival for stroke prophylaxis in the setting of A-fib  Poorly controlled diabetes type 2 Patient hemoglobin A1c is 13.1 Blood sugars are controlled Continue sliding scale insulin  with CBG goal 140-180   The patient is critically ill due to cardiogenic shock status post VA ECMO.  Critical care was necessary to treat or prevent imminent or life-threatening deterioration.  Critical care was time spent personally by me on the following activities: development of treatment plan with patient and/or surrogate as well as nursing, discussions with consultants, evaluation of patient's response to treatment, examination of patient, obtaining history from patient or surrogate, ordering and performing treatments and interventions, ordering and review of  laboratory studies, ordering and review of radiographic studies, pulse oximetry, re-evaluation of patient's condition and participation in multidisciplinary rounds.   During this encounter critical care time was devoted to patient care services described in this note for 40 minutes.     Valinda Novas, MD [facility] See Amion for pager If no response to pager, please call [phone] until 7pm After 7pm, Please call E-link [phone]  "

## 2024-05-28 NOTE — Transfer of Care (Signed)
 Immediate Anesthesia Transfer of Care Note  Patient: Philip Richardson  Procedure(s) Performed: INSERTION, CARDIAC ASSIST DEVICE, IMPELLA 5.5 (Right: Axilla) AXILLARY ARTERY CUTDOWN (Axilla)  Patient Location: ICU  Anesthesia Type:General  Level of Consciousness: sedated  Airway & Oxygen Therapy: Patient remains intubated per anesthesia plan and Patient placed on Ventilator (see vital sign flow sheet for setting)  Post-op Assessment: Report given to RN and Post -op Vital signs reviewed and stable  Post vital signs: Reviewed and stable  Last Vitals:  Vitals Value Taken Time  BP 138/49 05/28/24 15:07  Temp    Pulse 53 05/28/24 15:22  Resp 8 05/28/24 15:22  SpO2 100 % 05/28/24 15:22  Vitals shown include unfiled device data.  Last Pain:  Vitals:   05/28/24 0800  TempSrc: Core  PainSc:       Patients Stated Pain Goal: 0 (05/27/24 1600)  Complications: No notable events documented.

## 2024-05-28 NOTE — Progress Notes (Signed)
 ANTICOAGULATION CONSULT NOTE  Pharmacy Consult for bivalirudin  Indication: LV thrombus + IABP + ECMO  Allergies[1]  Patient Measurements: Height: 6' 1 (185.4 cm) Weight: 89.9 kg (198 lb 3.1 oz) IBW/kg (Calculated) : 79.9 Heparin  Dosing Weight: 84 kg HEPARIN  DW (KG): 94.5  Vital Signs: Temp: 97.7 F (36.5 C) (12/25 0700) Temp Source: Core (12/25 0400) Pulse Rate: 79 (12/25 0700)  Labs: Recent Labs    05/25/24 1118 05/25/24 1356 05/26/24 0519 05/26/24 0535 05/27/24 0400 05/27/24 0723 05/27/24 1613 05/27/24 1739 05/27/24 2339 05/28/24 0059 05/28/24 0411  HGB  --    < >  --    < > 8.6*   < > 8.9*   < > 8.8* 8.5* 8.9*  HCT  --    < >  --    < > 26.4*   < > 27.6*   < > 26.0* 25.0* 27.7*  PLT  --    < >  --    < > 106*  --  106*  --   --   --  103*  APTT  --    < > 75*   < > 62*  --  65*  --   --   --  63*  LABPROT  --   --  22.1*  --  21.6*  --   --   --   --   --  20.5*  INR  --   --  1.8*  --  1.8*  --   --   --   --   --  1.7*  CREATININE  --    < >  --    < > 2.30*  --  2.27*  --   --   --  2.11*  CKTOTAL 374  --   --   --   --   --   --   --   --   --   --    < > = values in this interval not displayed.    Estimated Creatinine Clearance: 37.9 mL/min (A) (by C-G formula based on SCr of 2.11 mg/dL (H)).  Medical History: Past Medical History:  Diagnosis Date   Frequency of urination    GERD (gastroesophageal reflux disease)    Horseshoe kidney    BILATERAL   Hypertension    Renal calculus, bilateral    Type 2 diabetes mellitus (HCC)    Urgency of urination    Wears dentures     Assessment: 5 yoM presents as late presenting STEMI s/p overlapping DES x3 to LAD with ischemic CMP (EF 40% TTE, 30% cMRI) with LV thrombus on cMRI.  Not on anticoagulation prior to admission.  Pharmacy consulted for heparin  dosing. Pt s/p IABP placement and then VA-ECMO cannulation on 12/20. Pharmacy to transition to bivalirudin .  aPTT 65 sec remains therapeutic on bivalirudin   0.02 mg/kg/hr.  Hgb 8.9, pltc 106 - stable. No issues with circuit, stable. LDH 654, fibrinogen  786, both trending down. Plan for IABP upgrade to Impella 5.5 today.  Goal of Therapy:  aPTT 50-70 seconds Monitor platelets by anticoagulation protocol: Yes   Plan:  Continue bivalirudin  0.02 mg/kg/hr - dosing wt 84.2kg Check aPTT q12h - 5a/5p F/u anticoagulation plans after Impella 5.5 placement  Thank you for allowing pharmacy to be a part of this patients care.   Nidia Schaffer, PharmD PGY2 Cardiology Pharmacy Resident  Please check AMION for all [facility] Pharmacy phone numbers After 10:00 PM, call Main Pharmacy [phone] 05/28/2024  7:14  AM      [1]  Allergies Allergen Reactions   Atorvastatin  Other (See Comments)    Myopathy/weakness   Invokana  [Canagliflozin ] Other (See Comments)    weakness   Semaglutide  Other (See Comments)    Heartburn

## 2024-05-28 NOTE — Progress Notes (Signed)
" ° °  I accompanied Dr. Shyrl to OR for assistance with placement of Impella 5.5.   While in OR I also assisted with managing lows on all 3 devices as well as vasoactive drips.   Total CCT 65 mins not including any procedural time.   Toribio Fuel, MD  4:45 PM  "

## 2024-05-28 NOTE — Progress Notes (Addendum)
" ° °  IABP removal note  Right groin prepped and draped. Sutures removed. Pursestring suture placed around arteriotomy site and tied loosely. IABP placed on standby. Balloon pulled down to sheath and sheath/balloon removed as a unit. Manual pressure held x 35 mins. Pursestring suture tied down. Good hemostasis. Fem stop placed.  Impella 5.5 and drips managed personally during IABP removal.   Total CCT. 50 mins.  Toribio Fuel, MD  8:06 PM  "

## 2024-05-28 NOTE — Anesthesia Postprocedure Evaluation (Signed)
"   Anesthesia Post Note  Patient: Printmaker  Procedure(s) Performed: INSERTION, CARDIAC ASSIST DEVICE, IMPELLA 5.5 (Right: Axilla) AXILLARY ARTERY CUTDOWN (Axilla)     Patient location during evaluation: ICU Anesthesia Type: General Level of consciousness: sedated and patient remains intubated per anesthesia plan Pain management: pain level controlled Vital Signs Assessment: post-procedure vital signs reviewed and stable Respiratory status: patient remains intubated per anesthesia plan and patient on ventilator - see flowsheet for VS Cardiovascular status: continues to require inotropic and mechanical support: IABP, ECMO, Impella. Postop Assessment: no apparent nausea or vomiting Anesthetic complications: no   No notable events documented.  Last Vitals:  Vitals:   05/28/24 1630 05/28/24 1645  BP:    Pulse:    Resp: 20 20  Temp: (!) 36.1 C (!) 36.2 C  SpO2:      Last Pain:  Vitals:   05/28/24 1600  TempSrc: Core  PainSc:                  Mase Dhondt,E. Morena Mckissack      "

## 2024-05-29 ENCOUNTER — Inpatient Hospital Stay (HOSPITAL_COMMUNITY)

## 2024-05-29 ENCOUNTER — Encounter (HOSPITAL_COMMUNITY): Payer: Self-pay | Admitting: Thoracic Surgery (Cardiothoracic Vascular Surgery)

## 2024-05-29 ENCOUNTER — Inpatient Hospital Stay (HOSPITAL_COMMUNITY): Admitting: Anesthesiology

## 2024-05-29 ENCOUNTER — Encounter (HOSPITAL_COMMUNITY): Admission: EM | Disposition: E | Payer: Self-pay | Source: Home / Self Care | Attending: Internal Medicine

## 2024-05-29 DIAGNOSIS — I120 Hypertensive chronic kidney disease with stage 5 chronic kidney disease or end stage renal disease: Secondary | ICD-10-CM | POA: Diagnosis not present

## 2024-05-29 DIAGNOSIS — I2102 ST elevation (STEMI) myocardial infarction involving left anterior descending coronary artery: Secondary | ICD-10-CM | POA: Diagnosis not present

## 2024-05-29 DIAGNOSIS — N183 Chronic kidney disease, stage 3 unspecified: Secondary | ICD-10-CM | POA: Diagnosis not present

## 2024-05-29 DIAGNOSIS — Z9911 Dependence on respirator [ventilator] status: Secondary | ICD-10-CM

## 2024-05-29 DIAGNOSIS — T82524A Displacement of infusion catheter, initial encounter: Secondary | ICD-10-CM | POA: Diagnosis not present

## 2024-05-29 DIAGNOSIS — N179 Acute kidney failure, unspecified: Secondary | ICD-10-CM | POA: Diagnosis not present

## 2024-05-29 DIAGNOSIS — J9601 Acute respiratory failure with hypoxia: Secondary | ICD-10-CM | POA: Diagnosis not present

## 2024-05-29 DIAGNOSIS — Z9889 Other specified postprocedural states: Secondary | ICD-10-CM

## 2024-05-29 DIAGNOSIS — I5082 Biventricular heart failure: Secondary | ICD-10-CM | POA: Diagnosis not present

## 2024-05-29 DIAGNOSIS — E1122 Type 2 diabetes mellitus with diabetic chronic kidney disease: Secondary | ICD-10-CM | POA: Diagnosis not present

## 2024-05-29 DIAGNOSIS — I3139 Other pericardial effusion (noninflammatory): Secondary | ICD-10-CM

## 2024-05-29 DIAGNOSIS — R570 Cardiogenic shock: Secondary | ICD-10-CM | POA: Diagnosis not present

## 2024-05-29 DIAGNOSIS — N185 Chronic kidney disease, stage 5: Secondary | ICD-10-CM | POA: Diagnosis not present

## 2024-05-29 DIAGNOSIS — I429 Cardiomyopathy, unspecified: Secondary | ICD-10-CM | POA: Diagnosis not present

## 2024-05-29 LAB — BASIC METABOLIC PANEL WITH GFR
Anion gap: 17 — ABNORMAL HIGH (ref 5–15)
Anion gap: 14 (ref 5–15)
Anion gap: 12 (ref 5–15)
BUN: 51 mg/dL — ABNORMAL HIGH (ref 8–23)
BUN: 57 mg/dL — ABNORMAL HIGH (ref 8–23)
BUN: 59 mg/dL — ABNORMAL HIGH (ref 8–23)
CO2: 17 mmol/L — ABNORMAL LOW (ref 22–32)
CO2: 19 mmol/L — ABNORMAL LOW (ref 22–32)
CO2: 19 mmol/L — ABNORMAL LOW (ref 22–32)
Calcium: 8.5 mg/dL — ABNORMAL LOW (ref 8.9–10.3)
Calcium: 8.4 mg/dL — ABNORMAL LOW (ref 8.9–10.3)
Calcium: 8.1 mg/dL — ABNORMAL LOW (ref 8.9–10.3)
Chloride: 107 mmol/L (ref 98–111)
Chloride: 109 mmol/L (ref 98–111)
Chloride: 109 mmol/L (ref 98–111)
Creatinine, Ser: 2.09 mg/dL — ABNORMAL HIGH (ref 0.61–1.24)
Creatinine, Ser: 2.11 mg/dL — ABNORMAL HIGH (ref 0.61–1.24)
Creatinine, Ser: 2.11 mg/dL — ABNORMAL HIGH (ref 0.61–1.24)
GFR, Estimated: 34 mL/min — ABNORMAL LOW
GFR, Estimated: 33 mL/min — ABNORMAL LOW
GFR, Estimated: 33 mL/min — ABNORMAL LOW
Glucose, Bld: 302 mg/dL — ABNORMAL HIGH (ref 70–99)
Glucose, Bld: 208 mg/dL — ABNORMAL HIGH (ref 70–99)
Glucose, Bld: 163 mg/dL — ABNORMAL HIGH (ref 70–99)
Potassium: 4.9 mmol/L (ref 3.5–5.1)
Potassium: 3.6 mmol/L (ref 3.5–5.1)
Potassium: 3.7 mmol/L (ref 3.5–5.1)
Sodium: 141 mmol/L (ref 135–145)
Sodium: 142 mmol/L (ref 135–145)
Sodium: 141 mmol/L (ref 135–145)

## 2024-05-29 LAB — POCT I-STAT 7, (LYTES, BLD GAS, ICA,H+H)
Acid-base deficit: 8 mmol/L — ABNORMAL HIGH (ref 0.0–2.0)
Acid-base deficit: 7 mmol/L — ABNORMAL HIGH (ref 0.0–2.0)
Acid-base deficit: 7 mmol/L — ABNORMAL HIGH (ref 0.0–2.0)
Acid-base deficit: 7 mmol/L — ABNORMAL HIGH (ref 0.0–2.0)
Bicarbonate: 16.1 mmol/L — ABNORMAL LOW (ref 20.0–28.0)
Bicarbonate: 17.3 mmol/L — ABNORMAL LOW (ref 20.0–28.0)
Bicarbonate: 16.4 mmol/L — ABNORMAL LOW (ref 20.0–28.0)
Bicarbonate: 18.1 mmol/L — ABNORMAL LOW (ref 20.0–28.0)
Calcium, Ion: 1.21 mmol/L (ref 1.15–1.40)
Calcium, Ion: 1.19 mmol/L (ref 1.15–1.40)
Calcium, Ion: 1.2 mmol/L (ref 1.15–1.40)
Calcium, Ion: 1.2 mmol/L (ref 1.15–1.40)
HCT: 25 % — ABNORMAL LOW (ref 39.0–52.0)
HCT: 24 % — ABNORMAL LOW (ref 39.0–52.0)
HCT: 24 % — ABNORMAL LOW (ref 39.0–52.0)
HCT: 27 % — ABNORMAL LOW (ref 39.0–52.0)
Hemoglobin: 8.5 g/dL — ABNORMAL LOW (ref 13.0–17.0)
Hemoglobin: 8.2 g/dL — ABNORMAL LOW (ref 13.0–17.0)
Hemoglobin: 8.2 g/dL — ABNORMAL LOW (ref 13.0–17.0)
Hemoglobin: 9.2 g/dL — ABNORMAL LOW (ref 13.0–17.0)
O2 Saturation: 100 %
O2 Saturation: 100 %
O2 Saturation: 99 %
O2 Saturation: 98 %
Patient temperature: 36.6
Patient temperature: 36.5
Patient temperature: 36.6
Patient temperature: 36.3
Potassium: 4.8 mmol/L (ref 3.5–5.1)
Potassium: 4.7 mmol/L (ref 3.5–5.1)
Potassium: 4.7 mmol/L (ref 3.5–5.1)
Potassium: 3.6 mmol/L (ref 3.5–5.1)
Sodium: 141 mmol/L (ref 135–145)
Sodium: 140 mmol/L (ref 135–145)
Sodium: 140 mmol/L (ref 135–145)
Sodium: 143 mmol/L (ref 135–145)
TCO2: 17 mmol/L — ABNORMAL LOW (ref 22–32)
TCO2: 18 mmol/L — ABNORMAL LOW (ref 22–32)
TCO2: 17 mmol/L — ABNORMAL LOW (ref 22–32)
TCO2: 19 mmol/L — ABNORMAL LOW (ref 22–32)
pCO2 arterial: 27.5 mmHg — ABNORMAL LOW (ref 32–48)
pCO2 arterial: 27.2 mmHg — ABNORMAL LOW (ref 32–48)
pCO2 arterial: 26.2 mmHg — ABNORMAL LOW (ref 32–48)
pCO2 arterial: 32.4 mmHg (ref 32–48)
pH, Arterial: 7.375 (ref 7.35–7.45)
pH, Arterial: 7.41 (ref 7.35–7.45)
pH, Arterial: 7.402 (ref 7.35–7.45)
pH, Arterial: 7.351 (ref 7.35–7.45)
pO2, Arterial: 170 mmHg — ABNORMAL HIGH (ref 83–108)
pO2, Arterial: 195 mmHg — ABNORMAL HIGH (ref 83–108)
pO2, Arterial: 136 mmHg — ABNORMAL HIGH (ref 83–108)
pO2, Arterial: 98 mmHg (ref 83–108)

## 2024-05-29 LAB — TYPE AND SCREEN
ABO/RH(D): A POS
Antibody Screen: NEGATIVE
Unit division: 0
Unit division: 0
Unit division: 0
Unit division: 0

## 2024-05-29 LAB — CG4 I-STAT (LACTIC ACID)
Lactic Acid, Venous: 0.5 mmol/L (ref 0.5–1.9)
Lactic Acid, Venous: 0.5 mmol/L (ref 0.5–1.9)
Lactic Acid, Venous: 0.5 mmol/L (ref 0.5–1.9)

## 2024-05-29 LAB — BPAM RBC
Blood Product Expiration Date: 202601152359
Blood Product Expiration Date: 202601162359
Blood Product Expiration Date: 202601162359
Blood Product Expiration Date: 202601172359
ISSUE DATE / TIME: 202512251105
ISSUE DATE / TIME: 202512261239
ISSUE DATE / TIME: 202512261239
ISSUE DATE / TIME: 202512261239
Unit Type and Rh: 6200
Unit Type and Rh: 6200
Unit Type and Rh: 6200
Unit Type and Rh: 6200

## 2024-05-29 LAB — COOXEMETRY PANEL
Carboxyhemoglobin: 1.9 % — ABNORMAL HIGH (ref 0.5–1.5)
Methemoglobin: <0.7 % (ref 0.0–1.5)
O2 Saturation: 79.7 %
Total hemoglobin: 9.1 g/dL — ABNORMAL LOW (ref 12.0–16.0)

## 2024-05-29 LAB — CBC
HCT: 27.6 % — ABNORMAL LOW (ref 39.0–52.0)
HCT: 29 % — ABNORMAL LOW (ref 39.0–52.0)
HCT: 26.5 % — ABNORMAL LOW (ref 39.0–52.0)
Hemoglobin: 8.9 g/dL — ABNORMAL LOW (ref 13.0–17.0)
Hemoglobin: 9.5 g/dL — ABNORMAL LOW (ref 13.0–17.0)
Hemoglobin: 9 g/dL — ABNORMAL LOW (ref 13.0–17.0)
MCH: 27.7 pg (ref 26.0–34.0)
MCH: 27.6 pg (ref 26.0–34.0)
MCH: 28.5 pg (ref 26.0–34.0)
MCHC: 32.2 g/dL (ref 30.0–36.0)
MCHC: 32.8 g/dL (ref 30.0–36.0)
MCHC: 34 g/dL (ref 30.0–36.0)
MCV: 86 fL (ref 80.0–100.0)
MCV: 84.3 fL (ref 80.0–100.0)
MCV: 83.9 fL (ref 80.0–100.0)
Platelets: 113 K/uL — ABNORMAL LOW (ref 150–400)
Platelets: 108 K/uL — ABNORMAL LOW (ref 150–400)
Platelets: 94 K/uL — ABNORMAL LOW (ref 150–400)
RBC: 3.21 MIL/uL — ABNORMAL LOW (ref 4.22–5.81)
RBC: 3.44 MIL/uL — ABNORMAL LOW (ref 4.22–5.81)
RBC: 3.16 MIL/uL — ABNORMAL LOW (ref 4.22–5.81)
RDW: 15.9 % — ABNORMAL HIGH (ref 11.5–15.5)
RDW: 15.6 % — ABNORMAL HIGH (ref 11.5–15.5)
RDW: 15.9 % — ABNORMAL HIGH (ref 11.5–15.5)
WBC: 7.7 K/uL (ref 4.0–10.5)
WBC: 10.8 K/uL — ABNORMAL HIGH (ref 4.0–10.5)
WBC: 12.9 K/uL — ABNORMAL HIGH (ref 4.0–10.5)
nRBC: 0 % (ref 0.0–0.2)
nRBC: 0 % (ref 0.0–0.2)
nRBC: 0 % (ref 0.0–0.2)

## 2024-05-29 LAB — MAGNESIUM: Magnesium: 2.4 mg/dL (ref 1.7–2.4)

## 2024-05-29 LAB — GLUCOSE, CAPILLARY
Glucose-Capillary: 126 mg/dL — ABNORMAL HIGH (ref 70–99)
Glucose-Capillary: 162 mg/dL — ABNORMAL HIGH (ref 70–99)
Glucose-Capillary: 275 mg/dL — ABNORMAL HIGH (ref 70–99)
Glucose-Capillary: 295 mg/dL — ABNORMAL HIGH (ref 70–99)
Glucose-Capillary: 307 mg/dL — ABNORMAL HIGH (ref 70–99)
Glucose-Capillary: 291 mg/dL — ABNORMAL HIGH (ref 70–99)
Glucose-Capillary: 247 mg/dL — ABNORMAL HIGH (ref 70–99)
Glucose-Capillary: 203 mg/dL — ABNORMAL HIGH (ref 70–99)
Glucose-Capillary: 199 mg/dL — ABNORMAL HIGH (ref 70–99)
Glucose-Capillary: 208 mg/dL — ABNORMAL HIGH (ref 70–99)
Glucose-Capillary: 181 mg/dL — ABNORMAL HIGH (ref 70–99)
Glucose-Capillary: 146 mg/dL — ABNORMAL HIGH (ref 70–99)

## 2024-05-29 LAB — HEPATIC FUNCTION PANEL
ALT: 154 U/L — ABNORMAL HIGH (ref 0–44)
AST: 73 U/L — ABNORMAL HIGH (ref 15–41)
Albumin: 2.3 g/dL — ABNORMAL LOW (ref 3.5–5.0)
Alkaline Phosphatase: 158 U/L — ABNORMAL HIGH (ref 38–126)
Bilirubin, Direct: 1.1 mg/dL — ABNORMAL HIGH (ref 0.0–0.2)
Indirect Bilirubin: 0.4 mg/dL (ref 0.3–0.9)
Total Bilirubin: 1.4 mg/dL — ABNORMAL HIGH (ref 0.0–1.2)
Total Protein: 4.8 g/dL — ABNORMAL LOW (ref 6.5–8.1)

## 2024-05-29 LAB — DIC (DISSEMINATED INTRAVASCULAR COAGULATION)PANEL
D-Dimer, Quant: >20 ug{FEU}/mL — ABNORMAL HIGH (ref 0.00–0.50)
Fibrinogen: 511 mg/dL — ABNORMAL HIGH (ref 210–475)
INR: 1.9 — ABNORMAL HIGH (ref 0.8–1.2)
Platelets: 100 K/uL — ABNORMAL LOW (ref 150–400)
Prothrombin Time: 22.6 s — ABNORMAL HIGH (ref 11.4–15.2)
Smear Review: NONE SEEN
aPTT: >200 s (ref 24–36)

## 2024-05-29 LAB — PROTIME-INR
INR: 1.7 — ABNORMAL HIGH (ref 0.8–1.2)
Prothrombin Time: 20.9 s — ABNORMAL HIGH (ref 11.4–15.2)

## 2024-05-29 LAB — BETA-HYDROXYBUTYRIC ACID
Beta-Hydroxybutyric Acid: 1.79 mmol/L — ABNORMAL HIGH (ref 0.05–0.27)
Beta-Hydroxybutyric Acid: 0.44 mmol/L — ABNORMAL HIGH (ref 0.05–0.27)
Beta-Hydroxybutyric Acid: 0.06 mmol/L (ref 0.05–0.27)

## 2024-05-29 LAB — APTT
aPTT: 46 s — ABNORMAL HIGH (ref 24–36)
aPTT: 56 s — ABNORMAL HIGH (ref 24–36)

## 2024-05-29 LAB — PHOSPHORUS: Phosphorus: 5.5 mg/dL — ABNORMAL HIGH (ref 2.5–4.6)

## 2024-05-29 LAB — LACTATE DEHYDROGENASE: LDH: 580 U/L — ABNORMAL HIGH (ref 105–235)

## 2024-05-29 LAB — FIBRINOGEN: Fibrinogen: 728 mg/dL — ABNORMAL HIGH (ref 210–475)

## 2024-05-29 MED ORDER — PROPOFOL 10 MG/ML IV BOLUS
INTRAVENOUS | Status: AC
  Filled 2024-05-29: qty 20

## 2024-05-29 MED ORDER — AMIODARONE LOAD VIA INFUSION
150.0000 mg | Freq: Once | INTRAVENOUS | Status: AC
  Administered 2024-05-29: 150 mg via INTRAVENOUS

## 2024-05-29 MED ORDER — PROPOFOL 10 MG/ML IV BOLUS
INTRAVENOUS | Status: DC | PRN
  Administered 2024-05-29: 35 ug/kg/min via INTRAVENOUS
  Administered 2024-05-29 (×2): 50 mg via INTRAVENOUS

## 2024-05-29 MED ORDER — HEPARIN SODIUM (PORCINE) 1000 UNIT/ML IJ SOLN
INTRAMUSCULAR | Status: DC | PRN
  Administered 2024-05-29: 9000 [IU] via INTRAVENOUS

## 2024-05-29 MED ORDER — INSULIN ASPART 100 UNIT/ML IJ SOLN: 6.0000 [IU] | INTRAMUSCULAR | Status: DC

## 2024-05-29 MED ORDER — PROPOFOL 1000 MG/100ML IV EMUL: 0.0000 ug/kg/min | INTRAVENOUS | Status: DC

## 2024-05-29 MED ORDER — OXIDIZED CELLULOSE EX PADS
1.0000 | MEDICATED_PAD | Freq: Once | CUTANEOUS | Status: AC
  Administered 2024-05-29: 1 via TOPICAL
  Filled 2024-05-29: qty 1

## 2024-05-29 MED ORDER — ROCURONIUM BROMIDE 100 MG/10ML IV SOLN
INTRAVENOUS | Status: DC | PRN
  Administered 2024-05-29: 50 mg via INTRAVENOUS
  Administered 2024-05-29: 100 mg via INTRAVENOUS

## 2024-05-29 MED ORDER — FENTANYL CITRATE (PF) 250 MCG/5ML IJ SOLN
INTRAMUSCULAR | Status: DC | PRN
  Administered 2024-05-29 (×2): 50 ug via INTRAVENOUS

## 2024-05-29 MED ORDER — PROTAMINE SULFATE 10 MG/ML IV SOLN
INTRAVENOUS | Status: AC
  Administered 2024-05-29: 25 mg via INTRAVENOUS
  Filled 2024-05-29: qty 5

## 2024-05-29 MED ORDER — INSULIN GLARGINE 100 UNIT/ML ~~LOC~~ SOLN
10.0000 [IU] | Freq: Every day | SUBCUTANEOUS | Status: DC
  Filled 2024-05-29: qty 0.1

## 2024-05-29 MED ORDER — NOREPINEPHRINE 4 MG/250ML-% IV SOLN
INTRAVENOUS | Status: AC
  Filled 2024-05-29: qty 250

## 2024-05-29 MED ORDER — GUAIFENESIN-DM 100-10 MG/5ML PO SYRP: 5.0000 mL | ORAL_SOLUTION | ORAL | Status: DC | PRN

## 2024-05-29 MED ORDER — ACETAMINOPHEN 325 MG PO TABS: 650.0000 mg | ORAL_TABLET | ORAL | Status: DC | PRN

## 2024-05-29 MED ORDER — INSULIN REGULAR(HUMAN) IN NACL 100-0.9 UT/100ML-% IV SOLN
INTRAVENOUS | Status: DC
  Administered 2024-05-29: 6 [IU]/h via INTRAVENOUS
  Administered 2024-05-29: 13 [IU]/h via INTRAVENOUS
  Filled 2024-05-29 (×2): qty 100

## 2024-05-29 MED ORDER — ALBUMIN HUMAN 5 % IV SOLN: INTRAVENOUS | Status: DC | PRN

## 2024-05-29 MED ORDER — SODIUM CHLORIDE 0.9 % IV SOLN: INTRAVENOUS | Status: DC | PRN

## 2024-05-29 MED ORDER — INSULIN ASPART 100 UNIT/ML IJ SOLN: 0.0000 [IU] | INTRAMUSCULAR | Status: DC

## 2024-05-29 MED ORDER — PROPOFOL 1000 MG/100ML IV EMUL
INTRAVENOUS | Status: AC
  Filled 2024-05-29: qty 300

## 2024-05-29 MED ORDER — POTASSIUM CHLORIDE 10 MEQ/50ML IV SOLN
10.0000 meq | INTRAVENOUS | Status: AC
  Administered 2024-05-29 (×2): 10 meq via INTRAVENOUS
  Filled 2024-05-29 (×2): qty 50

## 2024-05-29 MED ORDER — ADULT MULTIVITAMIN W/MINERALS CH
1.0000 | ORAL_TABLET | Freq: Every day | ORAL | Status: DC
  Administered 2024-05-30: 1
  Filled 2024-05-29: qty 1

## 2024-05-29 MED ORDER — SUGAMMADEX SODIUM 200 MG/2ML IV SOLN
INTRAVENOUS | Status: AC
  Filled 2024-05-29: qty 2

## 2024-05-29 MED ORDER — HEPARIN SODIUM (PORCINE) 1000 UNIT/ML IJ SOLN
INTRAMUSCULAR | Status: AC
  Filled 2024-05-29: qty 10

## 2024-05-29 MED ORDER — HEPARIN 6000 UNIT IRRIGATION SOLUTION
Status: DC | PRN
  Administered 2024-05-29: 1

## 2024-05-29 MED ORDER — SODIUM CHLORIDE 0.9 % IV SOLN
1.0000 g | Freq: Two times a day (BID) | INTRAVENOUS | Status: DC
  Administered 2024-05-29 – 2024-05-30 (×2): 1 g via INTRAVENOUS
  Filled 2024-05-29 (×2): qty 20

## 2024-05-29 MED ORDER — PROTAMINE SULFATE 10 MG/ML IV SOLN: 25.0000 mg | INTRAVENOUS | Status: AC

## 2024-05-29 MED ORDER — ORAL CARE MOUTH RINSE: 15.0000 mL | OROMUCOSAL | Status: DC | PRN

## 2024-05-29 MED ORDER — PROPOFOL 1000 MG/100ML IV EMUL
INTRAVENOUS | Status: AC
  Filled 2024-05-29: qty 100

## 2024-05-29 MED ORDER — FENTANYL CITRATE (PF) 250 MCG/5ML IJ SOLN
INTRAMUSCULAR | Status: AC
  Filled 2024-05-29: qty 5

## 2024-05-29 MED ORDER — ROCURONIUM BROMIDE 10 MG/ML (PF) SYRINGE
PREFILLED_SYRINGE | INTRAVENOUS | Status: AC
  Filled 2024-05-29: qty 20

## 2024-05-29 MED ORDER — DEXTROSE 50 % IV SOLN: 0.0000 mL | INTRAVENOUS | Status: DC | PRN

## 2024-05-29 MED ORDER — ORAL CARE MOUTH RINSE
15.0000 mL | OROMUCOSAL | Status: DC
  Administered 2024-05-29 – 2024-05-30 (×11): 15 mL via OROMUCOSAL

## 2024-05-29 NOTE — Transfer of Care (Signed)
 Immediate Anesthesia Transfer of Care Note  Patient: Philip Richardson  Procedure(s) Performed: SUBXIPHOID PERICARDIAL WINDOW; DECANNULATION FOR  ECMO (EXTRACORPOREAL MEMBRANE OXYGENATION) AND ARTERIAL REPAIR  Patient Location: SICU  Anesthesia Type:General  Level of Consciousness: sedated and Patient remains intubated per anesthesia plan  Airway & Oxygen Therapy: Patient remains intubated per anesthesia plan and Patient placed on Ventilator (see vital sign flow sheet for setting)  Post-op Assessment: Report given to RN and Post -op Vital signs reviewed and stable  Post vital signs: Reviewed and stable  Last Vitals:  Vitals Value Taken Time  BP 128/73   Temp 36.2   Pulse 70 05/29/24 16:22  Resp 20 05/29/24 16:22  SpO2 98 % 05/29/24 16:22  Vitals shown include unfiled device data.  Last Pain:  Vitals:   05/29/24 1200  TempSrc: Core  PainSc:       Patients Stated Pain Goal: 0 (05/27/24 1600)  Complications: No notable events documented.

## 2024-05-29 NOTE — Plan of Care (Signed)
" ° ° ° °  Referral previously received for [facility] for goals of care discussion. Noted most recent palliative in-person assessment dated 05/26/2024.   Chart reviewed for Recent provider notes, nurse notes, TOC notes, vitals, and labs and updates received from RN.  Reviewed vascular and vein specialists progress note from 05/29/2024, patient has significantly improved clinically, therefore is ready for ECMO decannulation today.  Reviewed cardiothoracic surgery operative note from 05/29/2024, patient also with significant pericardial effusion with right ventricular compression, status post pericardial effusion drain prior to ECMO decannulation.  Reviewed heart failure progress note from 05/29/2024 detailing treatment plan mainly for CAD, STEMI, acute systolic CHF, ischemic cardiomyopathy resulting to cardiogenic shock.  Patient is improving, plan for VA ECMO decannulation today.  Reviewed to assist with prognostication.   I went to visit patient at bedside today, patient was not available. Per note patient went to OR for ECMO decannulation.    At this time patient appears stable. No plan for in person follow-up at this time.  Goals of care unchanged continue with full code, full scope and hopes for medical optimization.  Please contact the palliative medicine provider on service for any new/urgent needs that require our assistance with this patient.  Thank you for your referral and allowing PMT to assist in Philip Richardson's care.   Philip Richardson. Philip Hoffmeier Jr., AGNP-C Palliative Medicine Team Phone: [phone]  NO CHARGE  "

## 2024-05-29 NOTE — Progress Notes (Signed)
 Groins look soft on my evening rounds. L groin has clean bandage. R groin with some ecchymosis. No hematomas in groins. Drainage from chest tube. Will apply quickclot.   Debby SAILOR. Magda, MD [facility] Phone Number: [phone] 05/29/2024 8:33 PM

## 2024-05-29 NOTE — Progress Notes (Signed)
" ° °  I accompanied patient to OR while he underwent ECMO decannulation by Dr. Magda.   Prior to decannulation I did a weaning trial with ECMO flow < 1.0 L and Impella at P-8  Tolerated well with MAPs in 80-90s but CVP was 22.   TEE reviewed and RV looked nearly normal. We turned ECMO flow back up and no change in CVP.   Decision made for decannulation.   During decannulation I helped manage drips and flows on Impella.   Additional CCT 65 mins  Toribio Fuel, MD  5:53 PM  "

## 2024-05-29 NOTE — Progress Notes (Signed)
 [facility]IN [facility] OF [place] PROGRESS NOTE  ASSESSMENT / PLAN: Philip Richardson is a 68 y.o. male with ECMO in progress. He has improved significantly and is ready for decannulation today.   SUBJECTIVE: Intubated / sedated  OBJECTIVE: BP 127/70   Pulse 95   Temp 98.4 F (36.9 C)   Resp (!) 23   Ht 6' 1 (1.854 m)   Wt 91.7 kg   SpO2 100%   BMI 26.67 kg/m   Intake/Output Summary (Last 24 hours) at 05/29/2024 1607 Last data filed at 05/29/2024 1520 Gross per 24 hour  Intake 3200.41 ml  Output 1805 ml  Net 1395.41 ml    No acute distress Arterial cannula in left groin Venous cannula in right groin     Latest Ref Rng & Units 05/29/2024   10:12 AM 05/29/2024    7:44 AM 05/29/2024    4:14 AM  CBC  WBC 4.0 - 10.5 K/uL   7.7   Hemoglobin 13.0 - 17.0 g/dL 8.2  8.2  8.9   Hematocrit 39.0 - 52.0 % 24.0  24.0  27.6   Platelets 150 - 400 K/uL   113         Latest Ref Rng & Units 05/29/2024   10:12 AM 05/29/2024    7:44 AM 05/29/2024    4:14 AM  CMP  Glucose 70 - 99 mg/dL   697   BUN 8 - 23 mg/dL   51   Creatinine 9.38 - 1.24 mg/dL   7.90   Sodium 864 - 854 mmol/L 140  140  141   Potassium 3.5 - 5.1 mmol/L 4.7  4.7  4.9   Chloride 98 - 111 mmol/L   107   CO2 22 - 32 mmol/L   17   Calcium  8.9 - 10.3 mg/dL   8.5   Total Protein 6.5 - 8.1 g/dL   4.8   Total Bilirubin 0.0 - 1.2 mg/dL   1.4   Alkaline Phos 38 - 126 U/L   158   AST 15 - 41 U/L   73   ALT 0 - 44 U/L   154     Estimated Creatinine Clearance: 38.2 mL/min (A) (by C-G formula based on SCr of 2.09 mg/dL (H)).  Debby SAILOR. Magda, MD [facility] of [facility] Phone Number: [phone] 05/29/2024 4:07 PM

## 2024-05-29 NOTE — Progress Notes (Signed)
 PT Cancellation Note  Patient Details Name: Philip Richardson MRN: [id number] DOB: 10/31/1955   Cancelled Treatment:    Reason Eval/Treat Not Completed: (P) Medical issues which prohibited therapy. Pt to go to OR for ECMO decannulation. RN asked to PT to follow back in another day.  Falen Lehrmann B. Fleeta Lapidus PT, DPT Acute Rehabilitation Services Please use secure chat or  Call Office [phone]    [name] 05/29/2024, 8:54 AM

## 2024-05-29 NOTE — Progress Notes (Signed)
 "  NAME:  Philip Richardson, MRN:  [id number], DOB:  03/27/1956, LOS: 8 ADMISSION DATE:  05/21/2024, CONSULTATION DATE:  12/20 REFERRING MD:  Bensimhon, AHF CHIEF COMPLAINT:  cardiogenic shock    History of Present Illness:  68 year old male with past medical history of poorly controlled T2DM, GERD, HTN, HLD, CKD IIIb admitted 12/18 after presenting as code STEMI. Began having chest pain on Sunday (12/14) which resolved and then recurred.   Cath showed LAD occlusion tx with DES x 3 + IABP due to high LVEDP.  Persistent pain and cardiogenic shock post cath .  Overnight progressive shock so taken to cath lab.  IABP placed but no improvement so cannulated for VA ECMO.   PCCM consulted to assist with management.  Pertinent  Medical History  DM2 HTN HLD CKD  Significant Hospital Events: Including procedures, antibiotic start and stop dates in addition to other pertinent events   12/18-DES x 3 IABP 12/19 RHC: On NE 15, milrinone  0.375 and VP 0.02 Findings: Ao =81/58 (65) RA = 12 RV = 17/14 PA =  24/18 (20) PCW = 22 Fick cardiac output/index = 4.4/2.1 Thermo CO/CI =4.6/2.2 SVR = 770  Ao sat = 99% PA sat = 60%, 62% PAPi = 0.5 ECMO cannulation; not impella candidate d/t LV clot so using IABP to vent LV 12/21 Flow at 4.7lpm, sweep 0.6; lactate cleared. Bedside POCUS 20-25% CVP 7. Back in NSR. Pressor requirements much improvd. Only on epi. Had been on NE, VPA and methylene blue  12/22 remain on [place]  ECMO with flow 3.5 L, on IABP 1:1.  Came off of pressors overnight.  LDH is trending up.  Remained in sinus rhythm 12/23 patient remained afebrile, came off of vasopressor support, remained on [place] ECMO with 3.5 L flow, tolerated coming down on [place] ECMO weaning to 1.5, became hypotensive after starting dobutamine  and required Levophed  infusion 12/24 patient remained in A-fib with controlled rate, started on amiodarone  infusion.  Continue to require vasopressors but improved from yesterday, on 4 mics of  Levophed , epinephrine  2.5 12/26, in DKA.  Started insulin  drip.  Removing VA ECMO cannulas.  Continues to require Impella support currently P4  Interim History / Subjective:  Patient remained afebrile Converted back to sinus rhythm Remain on amiodarone  at 60, on epinephrine  at 2.5 and norepinephrine  at 11 mics  Objective   Blood pressure (!) 138/49, pulse 61, temperature 97.7 F (36.5 C), resp. rate 20, height 6' 1 (1.854 m), weight 91.7 kg, SpO2 100%. PAP: (15-93)/(0-24) 22/17 CVP:  [0 mmHg-41 mmHg] 13 mmHg  Vent Mode: SIMV;PRVC;PSV FiO2 (%):  [50 %] 50 % Set Rate:  [20 bmp] 20 bmp Vt Set:  [630 mL-640 mL] 640 mL PEEP:  [5 cmH20] 5 cmH20 Pressure Support:  [10 cmH20] 10 cmH20 Plateau Pressure:  [15 cmH20-23 cmH20] 18 cmH20   Intake/Output Summary (Last 24 hours) at 05/29/2024 0737 Last data filed at 05/29/2024 0700 Gross per 24 hour  Intake 3265.84 ml  Output 2315 ml  Net 950.84 ml   Filed Weights   05/27/24 0500 05/28/24 0530 05/29/24 0540  Weight: 91.5 kg 89.9 kg 91.7 kg    Examination: General heavily sedated on Precedex  even at lower dosing's at 0.4 mics per minute resulting in very significant sedation HEENT normocephalic atraumatic IJ catheter is unremarkable pupils are briskly responsive, does have icteric sclera pulmonary: Currently on full ventilator support using in SIMV mode tidal volume 640 respiratory rate 20 PEEP 5 FiO2 40%, no spontaneous ventilation over current ventilator  mode. Chest x-ray personally reviewed the endotracheal tubes in satisfactory position, the Impella 5.5 terminates in similar position as it did on 25th the pulmonary artery catheter has been retracted some compared to prior imaging on the 25th basilar right atelectasis.Arterial blood gas reviewed currently 7.41 pCO2 of 27 pO2 195 bicarbonate of 17 Cardiac sinus rhythm regular in the 60s currently on Impella 5.5 P4 flowing at 1.6.  Current ECMO at 4.9 L RPM 3755, does have intermittent  decreased loss and pulsatility with cough, or aggressive weaning of norepinephrine  however also appears significantly sensitive to sedation with increased hypertension off Precedex  Abdomen soft nontender Extremities cool, pulses via Doppler bilaterally.  Arterial and venous ECMO site dressings are clean dry and intact.  The right axillary 5.5 dressing is clean dry and intact, he has dependent edema. Neuro heavily sedated on Precedex  of 0.9 initially.  With this could only obtain a cough.  Precedex  weaned to 0 for wake-up assessment and the patient did move all extremities and follow commands.  Pupils are equal and reactive GU Green discolored urine Foley catheter in place     Resolved Hospital Problem list     Assessment & Plan:  Coronary artery disease with late presenting anterior STEMI s/p overlapping DES x 3 Acute HFrEF due to ischemic cardiomyopathy with cardiogenic shock status post IABP 12/19 and VA ECMO cannulation on 12/20  Post MI pericarditis LV thrombus Echocardiogram showed EF 25%, cardiac MRI is positive for LV thrombus but TEE was done on 12/24 at which showed no LV thrombus Norepinephrine  currently at 15 mics per minute, had been weaned down to as low as 10 but then lost flow A-line Plan Continue  aspirin  and Brilinta  Continue Crestor  Continue colchicine  for possible post MI pericarditis Holding bival with plan to decannulate today.   Utilizing 5.5 Impella for MCS support, anticipate he may need increased flow after removal of ECMO can resume when okay by surgical team Continue to wean norepinephrine  for mean arterial pressure greater than 65 Continue fixed dose epinephrine  at 2.5 mics per minute Continue telemetry monitoring  Paroxysmal A-fib with RVR Converted back to sinus rhythm Plan Continue amiodarone  infusion, currently on 60 mg/h Anticoagulation with bival  Poorly controlled diabetes type 2 Patient hemoglobin A1c is 13.1 Currently w/ poor glycemic control   Plan Checking beta hydroxybutyric acid Insulin  gtt w/ q1h accucheck  AKI on CKD3b w/ anion gap metabolic acidosis  Serum creatinine climbing. Acidosis worse. Could be a some degree of DKA given + AG acidosis  plan Checking lactate and ruling out DKA Ensure MAP >65 Strict I&O Renal dose meds  Serial labs  TIA Patient had gaze deviation and dysphagia (no hemiplegia) stroke code was called on 12/22 Neurological symptoms improved within 1 hour Head CT showed no neurological changes.  Plan Cont  DAPT and statin bival for stroke prophylaxis in the setting of A-fib (resume after VA cannulas removed when OK by surgical team)  Dec sedation to re-assess this am. Seems to not be moving LEs  Elevated LFTs, secondary to shock liver and hemolysis Has had significant decrease in his LDH as we have come down on MCS support. Plan Continue to trend LDH and LFTs I suspect removal of VA ECMO will continue to improve these parameters  Delirium Plan Continue Seroquel  and melatonin Continue low-dose Precedex   Acute hypoxemic respiratory failure due to pulmonary edema Currently with metabolic acidosis and respiratory compensation  plan Keep ventilated as he is going to the OR Once back from  the operating room can assess for spontaneous breathing trial For now we will continue RASS goal -1 VAP bundle If not extubated later today we will need to repeat an arterial blood gas, I think he will need less minute ventilation once we correct his DKA  ABLA expected post cannulation Thrombocytopenia critical illness Hgb holding at 8.9, PLTs improved Plan Cont to trend Trigger for xfusion is hgb < 8 Trend PLTs    I personally  spent 62 minutes  on this patient which included: review of medical records, nursing notes, progress notes, evaluation, interpretation of lab data and diagnostic studies, taking independent history, performing exam, documenting plan, ordering diagnostics and interventions for  the following critical care issues: Circulatory shock, Acute respiratory failure, Acute toxic and or metabolic encephalopathy, Acute renal failure with the following interventions which included: titration of ventilatory support, titration of hemodynamic drips to desired MAP, evaluation and management of acute renal failure, prevention of further deterioration      "

## 2024-05-29 NOTE — Progress Notes (Signed)
 Bleeding after decannulation from bilateral groin sites, around pericardial window incision. No hematomas in groins currently. Rns holding pressure. PTT >200  Planning to give protamine  to reverse heparin  given in OR. Dosing d/w pharmacy- give 25 units now, recheck PTT 2 hours after since dose of heparin  was given several hours ago.   VVS, AHF updated on bleeding.  Leita SHAUNNA Gaskins, DO 05/29/2024 6:06 PM [facility]  For contact information, see Amion. If no response to pager, please call PCCM consult pager. After hours, 7PM- 7AM, please call Elink.

## 2024-05-29 NOTE — CV Procedure (Signed)
 ECMO DAILY NOTE:   Indication: Cardiogenic shock   Initial cannulation date: 05/23/24  ECMO day #7   ECMO type: VA ECMO   1) 25 FR multi-stage venous drainage in R CFV 2) 19 FR return cannula in L CFA     Daily data:   Flow 4.2 RPM 3400 Sweep  0.5L    Labs:   ABG    Component Value Date/Time   PHART 7.410 05/29/2024 0744   PCO2ART 27.2 (L) 05/29/2024 0744   PO2ART 195 (H) 05/29/2024 0744   HCO3 17.3 (L) 05/29/2024 0744   TCO2 18 (L) 05/29/2024 0744   ACIDBASEDEF 7.0 (H) 05/29/2024 0744   O2SAT 100 05/29/2024 0744    PTT: 63 on bival Hgb 8.9 PLTs 113 LDH 654 -> 580 LA 0.5   Plan:  Now with Impella 5.5.  I did weaning trial with ECMO at 2L and Impella at P-8. Toelrated well with MAPs 80 and CVP stable at 8-10 Plan VA ECMO decannulation today    Toribio Fuel, MD  8:52 AM

## 2024-05-29 NOTE — Progress Notes (Signed)
 VA ECMO Weaning Trial   A weaning trial was started at 0925 per Dr. Bensimhon.  VA ECMO flows weaned to 2.5 LPM. Impella flow increased from P4 to P8. The following trial parameters have been discussed:  MAP > 70  During weaning trial, intermittent suction alarms from Impella required decrease in Impella flow but ultimately able to work flows back to P7/P8. Patient flipped into atrial fibrillation during weaning trial with rates 90s to low 100s. EKG obtained. ABG and LA unchanged on lower ECMO flows. Dr Cherrie notified of suction alarms and rhythm change. When Dr Cherrie arrived to assess patient, he used POCUS and discovered that patient had a large pericardial effusion. At this time, ECMO flows were weaned further to 1-2 LPM (per Dr Cherrie) and Impella flow maintained at P8. Plan for pericardial window with Dr Lucas and then decannulation with Dr Magda.   Weaning trial passed, and patient was posted for decannulation this afternoon.    ABG    Latest Ref Rng & Units 05/29/2024    7:44 AM 05/29/2024   10:12 AM  ABG - Last 2 Results  PH, Arterial 7.35 - 7.45 7.410  7.402   PCO2 arterial 32 - 48 mmHg 27.2  26.2   PO2, Arterial 83 - 108 mmHg 195  136   Bicarbonate 20.0 - 28.0 mmol/L 17.3  16.4   Acid-base deficit 0.0 - 2.0 mmol/L 7.0  7.0   O2 Saturation % 100  99

## 2024-05-29 NOTE — Plan of Care (Signed)
" °  Problem: Respiratory: Goal: Ability to maintain a clear airway and adequate ventilation will improve Outcome: Progressing   Problem: Cardiac: Goal: Ability to achieve and maintain adequate cardiopulmonary perfusion will improve Outcome: Progressing Goal: Vascular access site(s) Level 0-1 will be maintained Outcome: Progressing   Problem: Respiratory: Goal: Ability to maintain a clear airway and adequate ventilation will improve 05/29/2024 0146 by Trudy Lawence CROME, RN Outcome: Progressing 05/29/2024 0144 by Trudy Lawence CROME, RN Outcome: Progressing   Problem: Cardiac: Goal: Ability to achieve and maintain adequate cardiopulmonary perfusion will improve Outcome: Progressing Goal: Vascular access site(s) Level 0-1 will be maintained 05/29/2024 0146 by Trudy Lawence CROME, RN Outcome: Progressing Note: Femstop placed at 25 mmHg by Dr. Cherrie at bedside at 1930. Verbally instructed to remove femstop in 1-2 hours. 2130 femsstop removed. No issues noted.  05/29/2024 0144 by Trudy Lawence CROME, RN Outcome: Progressing   Problem: Respiratory: Goal: Will regain and/or maintain adequate ventilation Outcome: Progressing   Problem: Cardiac: Goal: Ability to achieve and maintain adequate cardiopulmonary perfusion will improve Outcome: Progressing   "

## 2024-05-29 NOTE — Op Note (Signed)
" °  CARDIOVASCULAR SURGERY OPERATIVE NOTE  05/29/2024  Surgeon:  Dorise LOIS Fellers, MD  First Assistant: RNFA   Preoperative Diagnosis:  pericardial effusion  Postoperative Diagnosis:  same  Procedure:  Subxyphoid pericardial window   Anesthesia:  General Endotracheal   Clinical History/Surgical Indication:  The patient is a 68 year old gentleman on VA ECMO who had an Impella 5.5 inserted yesterday via the right axillary artery and was going back to the OR today for ECMO decannulation. Bedside echo by Dr. Cherrie this afternoon showed what he felt was a significant pericardial effusion with RV compression. We felt that the best option was to do a subxyphoid pericardial window to drain the effusion just prior to ECMO decannulation. I discussed the operative procedure with the patient's significant other including alternatives, benefits and risks; including but not limited to bleeding, blood transfusion, infection,  organ dysfunction, and death. His significant other and sister understand and agree to proceed.    Preparation:  The patient was seen in the preoperative holding area and the correct patient, correct operation were confirmed with the patient after reviewing the medical record and catheterization. The consent was signed by me. Preoperative antibiotics were given.  The patient was taken back to the operating room and positioned supine on the operating room table. After being placed under general endotracheal anesthesia by the anesthesia team a foley catheter was placed. The neck, chest, and abdomen were prepped with betadine soap and solution and draped in the usual sterile manner. A surgical time-out was taken and the correct patient and operative procedure were confirmed with the nursing and anesthesia staff.   TEE:  Performed by Dr. Sarah Colhoun.  Subxyphoid pericardial window:  A 4 cm incision was made over the xyphoid process and carried down through the subcutaneous  tissue using cautery until the midline fascia was encountered. The fascia was divided in the midline and the subxyphoid space entered. The anterior pericardium was visualized just at the diaphragm and it was opened with cautery. The pericardial space was entered and about 100 cc of old bloody fluid was removed. TEE confirmed that all of the fluid was removed. A 28 F Blake drain was placed in the inferior pericardial space through a separate small incision. Hemostasis was complete. The midline fascia was approximated with continuous # 1 vicryl suture. The subcutaneous tissue was approximated with continuous 2-0 vicryl suture and the skin with 3-0 vicryl subcuticular suture. The sponge, needle, and instrument counts were correct according to the nurses.  Then the ECMO cannulation procedure was started per Dr. Magda and the remainder will be dictated by him. "

## 2024-05-29 NOTE — Progress Notes (Signed)
 TCTS:  Bedside echo by Dr. Cherrie this afternoon showed a significant pericardial effusion compressing the right ventricle. He is going to the OR today for ECMO decannulation. We feel it is probably best to drain the pericardial effusion by sub-xyphoid window prior to decannulation to remove compressive effect on RV and maximize the likelihood that he will be ok with Impella 5.5 only. I discussed the operative procedure with the patient's significant other including alternatives, benefits and risks; including but not limited to bleeding, blood transfusion, infection,  organ dysfunction, and death. His significant other and sister understand and agree to proceed.

## 2024-05-29 NOTE — Progress Notes (Signed)
 Went to OR for pericardial window (new pericardial effusion identified on bedside ECHO today w / significant RV compression) and then for planned decannulation of femoral VA cannulas by vascular.   100 ml old bloody fluid aspirated by pericardial drain immediately intraoperatively, 20fr drain left in place.   This was then followed by vascular surgery team assuming care for removal of the femoral VA cannulas.   Intraoperative event EBL:  Blood products 630 ml PRBV Fluids 250 albumin    Returned to unit  Had been started on epi gtt and also diprivan   Returned to ICU  Impella P7 and flow 4.3 liters Current MAP mid 80s Dressings marked  Plan F/u post op labs Including abg chem and cbc Hope to wean diprivan   Cont supportive care

## 2024-05-29 NOTE — Progress Notes (Signed)
" °   °  [address] [place] [address]             [phone]       No events Cannulation site dry To OR today for ECMO decannulation Ok to start anticoagulation following decannulation from my standpoint  Hervey Wedig O Urian Martenson   "

## 2024-05-29 NOTE — Anesthesia Postprocedure Evaluation (Signed)
"   Anesthesia Post Note  Patient: Printmaker  Procedure(s) Performed: SUBXIPHOID PERICARDIAL WINDOW; DECANNULATION FOR  ECMO (EXTRACORPOREAL MEMBRANE OXYGENATION) AND ARTERIAL REPAIR     Patient location during evaluation: SICU Anesthesia Type: General Level of consciousness: sedated Pain management: pain level controlled Vital Signs Assessment: post-procedure vital signs reviewed and stable Respiratory status: patient remains intubated per anesthesia plan Cardiovascular status: stable Postop Assessment: no apparent nausea or vomiting Anesthetic complications: no   No notable events documented.  Last Vitals:  Vitals:   05/29/24 1645 05/29/24 1700  BP:    Pulse: 68 69  Resp: 20 20  Temp: (!) 36.2 C (!) 36.2 C  SpO2: 99% 99%    Last Pain:  Vitals:   05/29/24 1700  TempSrc: Core  PainSc:                  Deneise Getty,W. EDMOND      "

## 2024-05-29 NOTE — Progress Notes (Signed)
 "    Advanced Heart Failure Rounding Note  Cardiologist: None   AHF Cardiologist: Dr. Rolan  Chief Complaint: Late presenting anterior MI, acute CHF Patient Profile   Philip Richardson is a 68 y.o. male with history of poorly controlled DM II, HTN, HLD and CKD IIIb. Admitted with late presenting anterior STEMI and acute systolic CHF.  Significant events:   12/18: LHC 100% p LAD treated with 3 overlapping DES, residual 90% p diagonal post PCI. LVEDP 24 mmHg. 12/19 IABP placed for progressive shock 12/20 Cannulated for VA ECMO  12/24 TEE  12/25 Impella 5.5 place. IABP removed  Subjective:    Remains on VA ECMO  Impella 5.5 placed yesterday. IABP removed  Intubated/sedated.  Impella at P-4 2.1L flow waveforms ok   Flowing 4.2L on ECMO   Back in NSR on IV amio  Labs stable. LDH coming down.   Sugars elevated. ? Mild DKA this am   LA 0.5    PAP: (15-93)/(0-24) 22/15 CVP:  [0 mmHg-41 mmHg] 11 mmHg PCWP:  [16 mmHg] 16 mmHg CO:  [2.9 L/min] 2.9 L/min CI:  [1.3 L/min/m2] 1.3 L/min/m2   Objective:    91.7 kg Body mass index is 26.67 kg/m.   Vital Signs:   Temp:  [96.3 F (35.7 C)-97.9 F (36.6 C)] 97.7 F (36.5 C) (12/26 0815) Pulse Rate:  [58-196] 60 (12/26 0815) Resp:  [5-36] 20 (12/26 0815) BP: (127-138)/(49-70) 127/70 (12/26 0812) SpO2:  [94 %-100 %] 100 % (12/26 0815) Arterial Line BP: (71-197)/(12-88) 126/71 (12/26 0815) FiO2 (%):  [40 %-50 %] 40 % (12/26 0812) Weight:  [91.7 kg] 91.7 kg (12/26 0540) Last BM Date : 05/24/24  Weight change: Filed Weights   05/27/24 0500 05/28/24 0530 05/29/24 0540  Weight: 91.5 kg 89.9 kg 91.7 kg    Intake/Output:   Intake/Output Summary (Last 24 hours) at 05/29/2024 0855 Last data filed at 05/29/2024 0800 Gross per 24 hour  Intake 3207 ml  Output 2270 ml  Net 937 ml     Physical Exam   General:  Intubated/sedated HEENT: + ETT Neck: RIJ swan  Cor: Regular rate & rhythm. No rubs, gallops or murmurs.  Impella 5.5 site ok  Lungs: clear Abdomen: soft, nontender, nondistended.Good bowel sounds. Extremities: no cyanosis, clubbing, rash, 1+ edema  Cannula sites ok Neuro: intubated/sedated   Telemetry   Sinus 60s Personally reviewed  Labs   CBC Recent Labs    05/28/24 1610 05/28/24 1620 05/29/24 0414 05/29/24 0744  WBC 7.7  --  7.7  --   HGB 9.4*   < > 8.9* 8.2*  HCT 29.2*   < > 27.6* 24.0*  MCV 86.4  --  86.0  --   PLT 82*  --  113*  --    < > = values in this interval not displayed.   Basic Metabolic Panel Recent Labs    87/74/74 0411 05/28/24 1610 05/28/24 1620 05/29/24 0414 05/29/24 0744  NA 141 140   < > 141 140  K 3.7 4.9   < > 4.9 4.7  CL 105 108  --  107  --   CO2 23 18*  --  17*  --   GLUCOSE 138* 181*  --  302*  --   BUN 43* 40*  --  51*  --   CREATININE 2.11* 1.91*  --  2.09*  --   CALCIUM  8.7* 8.5*  --  8.5*  --   MG 1.9  --   --  2.4  --   PHOS 4.0  --   --  5.5*  --    < > = values in this interval not displayed.   Liver Function Tests Recent Labs    05/28/24 0411 05/29/24 0414  AST 136* 73*  ALT 232* 154*  ALKPHOS 223* 158*  BILITOT 1.4* 1.4*  PROT 5.1* 4.8*  ALBUMIN  2.5* 2.3*   No results for input(s): LIPASE, AMYLASE in the last 72 hours. Cardiac Enzymes No results for input(s): CKTOTAL, CKMB, CKMBINDEX, TROPONINI in the last 72 hours.   BNP: BNP (last 3 results) No results for input(s): BNP in the last 8760 hours.  ProBNP (last 3 results) Recent Labs    05/21/24 1855  PROBNP 18,891.0*     D-Dimer No results for input(s): DDIMER in the last 72 hours. Hemoglobin A1C No results for input(s): HGBA1C in the last 72 hours.  Fasting Lipid Panel No results for input(s): CHOL, HDL, LDLCALC, TRIG, CHOLHDL, LDLDIRECT in the last 72 hours.  Medications:   Scheduled Medications:  aspirin   81 mg Per Tube Daily   bisacodyl   10 mg Rectal Daily   Chlorhexidine  Gluconate Cloth  6 each Topical Q0600    clopidogrel   75 mg Per Tube Daily   feeding supplement (PROSource TF20)  60 mL Per Tube Daily   insulin  aspart  0-20 Units Subcutaneous Q4H   insulin  aspart  6 Units Subcutaneous Q4H   insulin  glargine  10 Units Subcutaneous Daily   lactulose   30 g Per Tube Once   lidocaine   1 patch Transdermal Q24H   melatonin  3 mg Per Tube QHS   metoCLOPramide  (REGLAN ) injection  5 mg Intravenous Q8H   multivitamin with minerals  1 tablet Oral Daily   mouth rinse  15 mL Mouth Rinse Q2H   pantoprazole  (PROTONIX ) IV  40 mg Intravenous QHS   QUEtiapine   50 mg Per Tube BID   rOPINIRole   0.25 mg Per Tube Daily   sodium chloride  flush  10-40 mL Intracatheter Q12H   thiamine   100 mg Per Tube Daily    Infusions:  sodium chloride      albumin  human Stopped (05/23/24 1130)   amiodarone  60 mg/hr (05/29/24 0800)   dexmedetomidine  (PRECEDEX ) IV infusion 0.9 mcg/kg/hr (05/29/24 0800)   epinephrine  2.5 mcg/min (05/29/24 0800)   feeding supplement (VITAL 1.5 CAL) Stopped (05/28/24 0715)   meropenem  (MERREM ) IV Stopped (05/28/24 2227)   norepinephrine  (LEVOPHED ) Adult infusion 11 mcg/min (05/29/24 0800)   sodium bicarbonate  25 mEq (Impella PURGE) in dextrose  5 % 1000 mL bag     vancomycin  Stopped (05/28/24 1659)   vasopressin  Stopped (05/24/24 0928)    PRN Medications: sodium chloride , acetaminophen  (TYLENOL ) oral liquid 160 mg/5 mL, acetaminophen , albumin  human, fentaNYL  (SUBLIMAZE ) injection, guaiFENesin -dextromethorphan , midazolam  PF, ondansetron  (ZOFRAN ) IV, mouth rinse, mouth rinse, phenol  Assessment/Plan   1. CAD: Late-presenting anterior STEMI, symptoms began on 12/14.  Cath was done showing occluded LAD that was treated with overlapping DES x 3.  There was residual 80% D1. Symptoms had a pleuritic character pre-cath, and he continues to have significant pleuritic chest pain (not improved by PCI).  ECG post-cath showed persistent anterior STE.  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  No  significant pericardial effusion present.  - Continue ASA 81 and ticagrelor .  - Continue Crestor  10 mg daily, has not been able to tolerate statins in the past so will start low dose.  - No s/s angina 2. Acute systolic CHF: Ischemic cardiomyopathy. -> cardiogenic  shock SCAI stage D - POCUS echo EF 25% - cMRI 30% + LV clot - Now on VA ECMO (12/20) with IABP vent.  - Failed ECMO wean - does not tolerate afterlaad reduction with milrinone  and /or DBA - TEE 12/24 EF < 20% RV severely down. No apical clot - Impella 5.5 placed 12/25. IABP removed - Impella P-4 2.1L flow - VA ECMO 4.2L flow  - I did weaning trial with ECMO at 2L and Impella at P-8. Toelrated well with MAPs 80 and CVP stable at 8-10 - Plan VA ECMO decannulation today  3. AKI on CKD stage 3b: Suspect diabetic nephropathy. Creatinine 2.2>2.7 (baseline appears to be low 2s) - Renal function now sitting ~ 2.0 - no need for CVVHD at this point. 4. AF with RVR - continue IV amio and bival -back in SR continue IV amio 4. Type 2 DM: Poor control with HgbA1c 13.1.   - isugars up this am. CCM managing. LA 0.5 5. LV thrombus on cMRI  - bival on hold  - resolved on TEE 12/24 6. Elevated LFTs and LDH - concerning for hemolysis ECMO and IABP adjusted. Numbers improved 7. TIA - code stroke on 12/22 - CT ok. Symptoms now resolved.   CRITICAL CARE Performed by: Cherrie Sieving  Total critical care time: 50 minutes  Critical care time was exclusive of separately billable procedures and treating other patients.  Critical care was necessary to treat or prevent imminent or life-threatening deterioration.  Critical care was time spent personally by me (independent of midlevel providers or residents) on the following activities: development of treatment plan with patient and/or surrogate as well as nursing, discussions with consultants, evaluation of patient's response to treatment, examination of patient, obtaining history from patient  or surrogate, ordering and performing treatments and interventions, ordering and review of laboratory studies, ordering and review of radiographic studies, pulse oximetry and re-evaluation of patient's condition.   Length of Stay: 8  Sieving Cherrie, MD  05/29/2024, 8:55 AM  Advanced Heart Failure Team Pager [phone] (M-F; 7a - 5p)   Please visit [email or web address]: For overnight coverage please call cardiology fellow first. If fellow not available call Shock/ECMO MD on call.  For ECMO / Mechanical Support (Impella, IABP, LVAD) issues call Shock / ECMO MD on call.      "

## 2024-05-29 NOTE — TOC Progression Note (Signed)
 Transition of Care [facility]) - Progression Note    Patient Details  Name: Philip Richardson MRN: [id number] Date of Birth: [birth year]  [facility] CM/SW Contact  Arlana JINNY Nicholaus [place] Phone Number: [phone] 05/29/2024, 10:52 AM  Clinical Narrative:   Chart reviewed for discharge readiness, patient not medically stable for d/c.    HF CSW/CM will continue following and monitor for dc readiness; while HF team is following.     Expected Discharge Plan: Home/Self Care Barriers to Discharge: Continued Medical Work up               Expected Discharge Plan and Services   Discharge Planning Services: CM Consult   Living arrangements for the past 2 months: Single Family Home                                       Social Drivers of Health (SDOH) Interventions SDOH Screenings   Food Insecurity: No Food Insecurity (05/21/2024)  Housing: Low Risk (05/21/2024)  Transportation Needs: No Transportation Needs (05/21/2024)  Utilities: Not At Risk (05/21/2024)  Alcohol  Screen: Low Risk (01/31/2024)  Depression (PHQ2-9): Low Risk (05/14/2024)  Financial Resource Strain: Low Risk (01/31/2024)  Physical Activity: Sufficiently Active (01/31/2024)  Social Connections: Moderately Integrated (05/21/2024)  Stress: No Stress Concern Present (01/31/2024)  Tobacco Use: Low Risk (05/28/2024)  Health Literacy: Adequate Health Literacy (01/31/2024)    Readmission Risk Interventions     No data to display

## 2024-05-29 NOTE — Op Note (Signed)
 DATE OF SERVICE: 05/29/2024  PATIENT:  Philip Richardson  68 y.o. male  PRE-OPERATIVE DIAGNOSIS: ECMO cannula in place  POST-OPERATIVE DIAGNOSIS:  Same  PROCEDURE:   1) direct repair of left common femoral artery 2) direct repair of left superficial femoral artery 3) removal of cannula from right common femoral vein  SURGEON:  Surgeons and Role:    * Magda Debby SAILOR, MD - Primary   ASSISTANT: Adina Sender, PA-C  An experienced assistant was required given the complexity of this procedure and the standard of surgical care. My assistant helped with exposure through counter tension, suctioning, ligation and retraction to better visualize the surgical field.  My assistant expedited sewing during the case by following my sutures. Wherever I use the term we in the report, my assistant actively helped me with that portion of the procedure.  ANESTHESIA:   general  EBL:  BLOOD ADMINISTERED: PRBC  DRAINS: none   LOCAL MEDICATIONS USED:  NONE  SPECIMEN:  none  COUNTS: confirmed correct.  TOURNIQUET:  none  PATIENT DISPOSITION:  PACU - hemodynamically stable.   Delay start of Pharmacological VTE agent (>24hrs) due to surgical blood loss or risk of bleeding: no  INDICATION FOR PROCEDURE: Randeep Biondolillo is a 68 y.o. male with ECMO cannulae in place for cardiogenic shock.  He is weaned nicely from this and his heart failure team. After careful discussion of risks, benefits, and alternatives the patient was offered ECMO decannulation. The patient understood and wished to proceed.  OPERATIVE FINDINGS: Successful direct repair of common femoral artery and superficial femoral artery on the left.  Right venous cannula was removed and manual pressure held with good hemostasis.  DESCRIPTION OF PROCEDURE: After identification of the patient in the pre-operative holding area, the patient was transferred to the operating room. The patient was positioned supine on the operating room table.  Anesthesia was induced. The bilateral groins were prepped and draped in standard fashion. A surgical pause was performed confirming correct patient, procedure, and operative location.  Longitudinal incision was made in the left groin to expose the arterial cannulae.  The common femoral artery was skeletonized and Henley clamp slotted into place for use upon removing the cannula.  The distal common femoral artery was skeletonized and a large Gregori clamp applied in similar fashion for vascular control after removing the cannula.  We carried the exposure down on the superficial femoral artery and identified the antegrade perfusion catheter.  Gregory clamps were applied proximally distally to the access site and the cannula removed.  The repair was effected with a interrupted 5-0 Prolene suture.  Good hemostasis was achieved after releasing the clamps.  We then removed the arterial cannula and applied clamps proximally distally to the common femoral artery.  Common femoral artery was repaired using continuous running suture of 5-0 Prolene.  Prior to completion the repair was backbled.  The repair was completed.  Clamps were released.  Good Doppler flow was noted across the repair.  The venous cannula was then removed after applying a pursestring suture of 3-0 Monocryl.  Manual pressure was held to the venous access site with good hemostasis.  Hemostasis was confirmed in the surgical bed.  The wound was closed in layers using 2-0 Vicryl, 3-0 Vicryl and skin stapler.  Upon completion of the case instrument and sharps counts were confirmed correct. The patient was transferred to the PACU in good condition. I was present for all portions of the procedure.  FOLLOW UP PLAN: Remove staples in  2 to 3 weeks.  Debby SAILOR. Magda, MD [facility] Phone Number: [phone] 05/29/2024 4:15 PM

## 2024-05-29 NOTE — Anesthesia Preprocedure Evaluation (Addendum)
"                                    Anesthesia Evaluation  Patient identified by MRN, date of birth, ID band  Airway Mallampati: Unable to assess       Dental  (+) Teeth Intact   Pulmonary    + rhonchi        Cardiovascular hypertension, + Past MI and + Cardiac Stents   Rhythm:Regular Rate:Tachycardia    12/18: LHC 100% p LAD treated with 3 overlapping DES, residual 90% p diagonal post PCI. LVEDP 24 mmHg. 12/19 IABP placed for progressive shock 12/20 Cannulated for VA ECMO  12/24 TEE  12/25 Impella 5.5 place. IABP removed    Neuro/Psych    GI/Hepatic ,GERD  ,,  Endo/Other  diabetes, Type 2    Renal/GU Renal disease     Musculoskeletal   Abdominal   Peds  Hematology  (+) Blood dyscrasia, anemia   Anesthesia Other Findings   Reproductive/Obstetrics                              Anesthesia Physical Anesthesia Plan  ASA: 4  Anesthesia Plan: General   Post-op Pain Management:    Induction: Intravenous  PONV Risk Score and Plan: 2 and Treatment may vary due to age or medical condition  Airway Management Planned: Oral ETT  Additional Equipment: TEE, Arterial line, PA Cath and CVP  Intra-op Plan:   Post-operative Plan: Post-operative intubation/ventilation  Informed Consent: I have reviewed the patients History and Physical, chart, labs and discussed the procedure including the risks, benefits and alternatives for the proposed anesthesia with the patient or authorized representative who has indicated his/her understanding and acceptance.     Dental advisory given  Plan Discussed with: CRNA  Anesthesia Plan Comments:          Anesthesia Quick Evaluation  "

## 2024-05-30 DIAGNOSIS — Z515 Encounter for palliative care: Secondary | ICD-10-CM | POA: Diagnosis not present

## 2024-05-30 DIAGNOSIS — I429 Cardiomyopathy, unspecified: Secondary | ICD-10-CM | POA: Diagnosis not present

## 2024-05-30 DIAGNOSIS — R570 Cardiogenic shock: Secondary | ICD-10-CM | POA: Diagnosis not present

## 2024-05-30 DIAGNOSIS — I255 Ischemic cardiomyopathy: Secondary | ICD-10-CM

## 2024-05-30 DIAGNOSIS — I2109 ST elevation (STEMI) myocardial infarction involving other coronary artery of anterior wall: Secondary | ICD-10-CM

## 2024-05-30 DIAGNOSIS — I2102 ST elevation (STEMI) myocardial infarction involving left anterior descending coronary artery: Secondary | ICD-10-CM | POA: Diagnosis not present

## 2024-05-30 DIAGNOSIS — I5082 Biventricular heart failure: Secondary | ICD-10-CM | POA: Diagnosis not present

## 2024-05-30 DIAGNOSIS — I3139 Other pericardial effusion (noninflammatory): Secondary | ICD-10-CM

## 2024-05-30 DIAGNOSIS — Z9911 Dependence on respirator [ventilator] status: Secondary | ICD-10-CM

## 2024-05-30 DIAGNOSIS — Z7189 Other specified counseling: Secondary | ICD-10-CM | POA: Diagnosis not present

## 2024-05-30 DIAGNOSIS — E1122 Type 2 diabetes mellitus with diabetic chronic kidney disease: Secondary | ICD-10-CM

## 2024-05-30 DIAGNOSIS — J9601 Acute respiratory failure with hypoxia: Secondary | ICD-10-CM

## 2024-05-30 DIAGNOSIS — I480 Paroxysmal atrial fibrillation: Secondary | ICD-10-CM

## 2024-05-30 LAB — GLUCOSE, CAPILLARY
Glucose-Capillary: 123 mg/dL — ABNORMAL HIGH (ref 70–99)
Glucose-Capillary: 124 mg/dL — ABNORMAL HIGH (ref 70–99)
Glucose-Capillary: 125 mg/dL — ABNORMAL HIGH (ref 70–99)
Glucose-Capillary: 125 mg/dL — ABNORMAL HIGH (ref 70–99)
Glucose-Capillary: 128 mg/dL — ABNORMAL HIGH (ref 70–99)
Glucose-Capillary: 137 mg/dL — ABNORMAL HIGH (ref 70–99)
Glucose-Capillary: 139 mg/dL — ABNORMAL HIGH (ref 70–99)
Glucose-Capillary: 141 mg/dL — ABNORMAL HIGH (ref 70–99)
Glucose-Capillary: 146 mg/dL — ABNORMAL HIGH (ref 70–99)
Glucose-Capillary: 149 mg/dL — ABNORMAL HIGH (ref 70–99)
Glucose-Capillary: 157 mg/dL — ABNORMAL HIGH (ref 70–99)
Glucose-Capillary: 166 mg/dL — ABNORMAL HIGH (ref 70–99)

## 2024-05-30 LAB — BASIC METABOLIC PANEL WITH GFR
Anion gap: 13 (ref 5–15)
Anion gap: 15 (ref 5–15)
Anion gap: 12 (ref 5–15)
Anion gap: 12 (ref 5–15)
BUN: 61 mg/dL — ABNORMAL HIGH (ref 8–23)
BUN: 63 mg/dL — ABNORMAL HIGH (ref 8–23)
BUN: 62 mg/dL — ABNORMAL HIGH (ref 8–23)
BUN: 65 mg/dL — ABNORMAL HIGH (ref 8–23)
CO2: 17 mmol/L — ABNORMAL LOW (ref 22–32)
CO2: 19 mmol/L — ABNORMAL LOW (ref 22–32)
CO2: 20 mmol/L — ABNORMAL LOW (ref 22–32)
CO2: 19 mmol/L — ABNORMAL LOW (ref 22–32)
Calcium: 7.9 mg/dL — ABNORMAL LOW (ref 8.9–10.3)
Calcium: 7.9 mg/dL — ABNORMAL LOW (ref 8.9–10.3)
Calcium: 7.8 mg/dL — ABNORMAL LOW (ref 8.9–10.3)
Calcium: 7.6 mg/dL — ABNORMAL LOW (ref 8.9–10.3)
Chloride: 110 mmol/L (ref 98–111)
Chloride: 108 mmol/L (ref 98–111)
Chloride: 110 mmol/L (ref 98–111)
Chloride: 111 mmol/L (ref 98–111)
Creatinine, Ser: 2.24 mg/dL — ABNORMAL HIGH (ref 0.61–1.24)
Creatinine, Ser: 2.33 mg/dL — ABNORMAL HIGH (ref 0.61–1.24)
Creatinine, Ser: 2.31 mg/dL — ABNORMAL HIGH (ref 0.61–1.24)
Creatinine, Ser: 2.3 mg/dL — ABNORMAL HIGH (ref 0.61–1.24)
GFR, Estimated: 31 mL/min — ABNORMAL LOW
GFR, Estimated: 30 mL/min — ABNORMAL LOW
GFR, Estimated: 30 mL/min — ABNORMAL LOW
GFR, Estimated: 30 mL/min — ABNORMAL LOW
Glucose, Bld: 169 mg/dL — ABNORMAL HIGH (ref 70–99)
Glucose, Bld: 135 mg/dL — ABNORMAL HIGH (ref 70–99)
Glucose, Bld: 123 mg/dL — ABNORMAL HIGH (ref 70–99)
Glucose, Bld: 139 mg/dL — ABNORMAL HIGH (ref 70–99)
Potassium: 4 mmol/L (ref 3.5–5.1)
Potassium: 3.8 mmol/L (ref 3.5–5.1)
Potassium: 3.9 mmol/L (ref 3.5–5.1)
Potassium: 4 mmol/L (ref 3.5–5.1)
Sodium: 141 mmol/L (ref 135–145)
Sodium: 142 mmol/L (ref 135–145)
Sodium: 142 mmol/L (ref 135–145)
Sodium: 143 mmol/L (ref 135–145)

## 2024-05-30 LAB — POCT I-STAT 7, (LYTES, BLD GAS, ICA,H+H)
Acid-base deficit: 5 mmol/L — ABNORMAL HIGH (ref 0.0–2.0)
Acid-base deficit: 6 mmol/L — ABNORMAL HIGH (ref 0.0–2.0)
Bicarbonate: 17.8 mmol/L — ABNORMAL LOW (ref 20.0–28.0)
Bicarbonate: 17.9 mmol/L — ABNORMAL LOW (ref 20.0–28.0)
Calcium, Ion: 1.11 mmol/L — ABNORMAL LOW (ref 1.15–1.40)
Calcium, Ion: 1.12 mmol/L — ABNORMAL LOW (ref 1.15–1.40)
HCT: 23 % — ABNORMAL LOW (ref 39.0–52.0)
HCT: 20 % — ABNORMAL LOW (ref 39.0–52.0)
Hemoglobin: 7.8 g/dL — ABNORMAL LOW (ref 13.0–17.0)
Hemoglobin: 6.8 g/dL — CL (ref 13.0–17.0)
O2 Saturation: 99 %
O2 Saturation: 98 %
Patient temperature: 38.2
Patient temperature: 37.5
Potassium: 3.9 mmol/L (ref 3.5–5.1)
Potassium: 3.7 mmol/L (ref 3.5–5.1)
Sodium: 144 mmol/L (ref 135–145)
Sodium: 143 mmol/L (ref 135–145)
TCO2: 19 mmol/L — ABNORMAL LOW (ref 22–32)
TCO2: 19 mmol/L — ABNORMAL LOW (ref 22–32)
pCO2 arterial: 27.3 mmHg — ABNORMAL LOW (ref 32–48)
pCO2 arterial: 27.1 mmHg — ABNORMAL LOW (ref 32–48)
pH, Arterial: 7.428 (ref 7.35–7.45)
pH, Arterial: 7.43 (ref 7.35–7.45)
pO2, Arterial: 131 mmHg — ABNORMAL HIGH (ref 83–108)
pO2, Arterial: 97 mmHg (ref 83–108)

## 2024-05-30 LAB — HEPATIC FUNCTION PANEL
ALT: 108 U/L — ABNORMAL HIGH (ref 0–44)
AST: 81 U/L — ABNORMAL HIGH (ref 15–41)
Albumin: 2.5 g/dL — ABNORMAL LOW (ref 3.5–5.0)
Alkaline Phosphatase: 131 U/L — ABNORMAL HIGH (ref 38–126)
Bilirubin, Direct: 0.9 mg/dL — ABNORMAL HIGH (ref 0.0–0.2)
Indirect Bilirubin: 0.4 mg/dL (ref 0.3–0.9)
Total Bilirubin: 1.3 mg/dL — ABNORMAL HIGH (ref 0.0–1.2)
Total Protein: 4.6 g/dL — ABNORMAL LOW (ref 6.5–8.1)

## 2024-05-30 LAB — CBC
HCT: 26.8 % — ABNORMAL LOW (ref 39.0–52.0)
HCT: 24.8 % — ABNORMAL LOW (ref 39.0–52.0)
HCT: 21.4 % — ABNORMAL LOW (ref 39.0–52.0)
Hemoglobin: 8.8 g/dL — ABNORMAL LOW (ref 13.0–17.0)
Hemoglobin: 8.3 g/dL — ABNORMAL LOW (ref 13.0–17.0)
Hemoglobin: 7 g/dL — ABNORMAL LOW (ref 13.0–17.0)
MCH: 27.7 pg (ref 26.0–34.0)
MCH: 27.5 pg (ref 26.0–34.0)
MCH: 27.5 pg (ref 26.0–34.0)
MCHC: 32.8 g/dL (ref 30.0–36.0)
MCHC: 33.5 g/dL (ref 30.0–36.0)
MCHC: 32.7 g/dL (ref 30.0–36.0)
MCV: 84.3 fL (ref 80.0–100.0)
MCV: 82.1 fL (ref 80.0–100.0)
MCV: 83.9 fL (ref 80.0–100.0)
Platelets: 106 K/uL — ABNORMAL LOW (ref 150–400)
Platelets: 103 K/uL — ABNORMAL LOW (ref 150–400)
Platelets: 84 K/uL — ABNORMAL LOW (ref 150–400)
RBC: 3.18 MIL/uL — ABNORMAL LOW (ref 4.22–5.81)
RBC: 3.02 MIL/uL — ABNORMAL LOW (ref 4.22–5.81)
RBC: 2.55 MIL/uL — ABNORMAL LOW (ref 4.22–5.81)
RDW: 16.1 % — ABNORMAL HIGH (ref 11.5–15.5)
RDW: 16.1 % — ABNORMAL HIGH (ref 11.5–15.5)
RDW: 16.2 % — ABNORMAL HIGH (ref 11.5–15.5)
WBC: 15.2 K/uL — ABNORMAL HIGH (ref 4.0–10.5)
WBC: 15.4 K/uL — ABNORMAL HIGH (ref 4.0–10.5)
WBC: 11.3 K/uL — ABNORMAL HIGH (ref 4.0–10.5)
nRBC: 0.3 % — ABNORMAL HIGH (ref 0.0–0.2)
nRBC: 0.5 % — ABNORMAL HIGH (ref 0.0–0.2)
nRBC: 0.2 % (ref 0.0–0.2)

## 2024-05-30 LAB — COOXEMETRY PANEL
Carboxyhemoglobin: 1.7 % — ABNORMAL HIGH (ref 0.5–1.5)
Methemoglobin: <0.7 % (ref 0.0–1.5)
O2 Saturation: 73.3 %
Total hemoglobin: 8 g/dL — ABNORMAL LOW (ref 12.0–16.0)

## 2024-05-30 LAB — PROTIME-INR
INR: 1.8 — ABNORMAL HIGH (ref 0.8–1.2)
Prothrombin Time: 21.6 s — ABNORMAL HIGH (ref 11.4–15.2)

## 2024-05-30 LAB — FIBRINOGEN: Fibrinogen: 433 mg/dL (ref 210–475)

## 2024-05-30 LAB — BETA-HYDROXYBUTYRIC ACID
Beta-Hydroxybutyric Acid: 0.08 mmol/L (ref 0.05–0.27)
Beta-Hydroxybutyric Acid: 0.06 mmol/L (ref 0.05–0.27)
Beta-Hydroxybutyric Acid: 0.11 mmol/L (ref 0.05–0.27)

## 2024-05-30 LAB — APTT
aPTT: >200 s (ref 24–36)
aPTT: 39 s — ABNORMAL HIGH (ref 24–36)

## 2024-05-30 LAB — MAGNESIUM: Magnesium: 2.2 mg/dL (ref 1.7–2.4)

## 2024-05-30 LAB — PHOSPHORUS: Phosphorus: 3.8 mg/dL (ref 2.5–4.6)

## 2024-05-30 LAB — LACTIC ACID, PLASMA: Lactic Acid, Venous: 2.3 mmol/L (ref 0.5–1.9)

## 2024-05-30 LAB — TRIGLYCERIDES: Triglycerides: 73 mg/dL

## 2024-05-30 LAB — ECHO TEE: Est EF: <20

## 2024-05-30 LAB — PROCALCITONIN: Procalcitonin: 1.74 ng/mL

## 2024-05-30 LAB — LACTATE DEHYDROGENASE: LDH: 524 U/L — ABNORMAL HIGH (ref 105–235)

## 2024-05-30 LAB — CG4 I-STAT (LACTIC ACID): Lactic Acid, Venous: 1 mmol/L (ref 0.5–1.9)

## 2024-05-30 MED ORDER — METHYLENE BLUE (ANTIDOTE) 1 % IV SOLN
2.0000 mg/kg | Freq: Once | INTRAVENOUS | Status: AC
  Administered 2024-05-30: 183 mg via INTRAVENOUS
  Filled 2024-05-30: qty 18.3

## 2024-05-30 MED ORDER — GLYCOPYRROLATE 0.2 MG/ML IJ SOLN: 0.2000 mg | INTRAMUSCULAR | Status: DC | PRN

## 2024-05-30 MED ORDER — SODIUM CHLORIDE 0.9 % IV SOLN: INTRAVENOUS | Status: DC

## 2024-05-30 MED ORDER — FENTANYL 2500MCG IN NS 250ML (10MCG/ML) PREMIX INFUSION
0.0000 ug/h | INTRAVENOUS | Status: DC
  Administered 2024-05-30: 100 ug/h via INTRAVENOUS
  Filled 2024-05-30: qty 250

## 2024-05-30 MED ORDER — ACETAMINOPHEN 650 MG RE SUPP: 650.0000 mg | Freq: Four times a day (QID) | RECTAL | Status: DC | PRN

## 2024-05-30 MED ORDER — FENTANYL CITRATE (PF) 50 MCG/ML IJ SOSY: 12.5000 ug | PREFILLED_SYRINGE | INTRAMUSCULAR | Status: DC | PRN

## 2024-05-30 MED ORDER — ORAL CARE MOUTH RINSE: 15.0000 mL | OROMUCOSAL | Status: DC | PRN

## 2024-05-30 MED ORDER — HALOPERIDOL LACTATE 2 MG/ML PO CONC: 2.0000 mg | Freq: Four times a day (QID) | ORAL | Status: DC | PRN

## 2024-05-30 MED ORDER — GLYCOPYRROLATE 0.2 MG/ML IJ SOLN
0.2000 mg | INTRAMUSCULAR | Status: DC | PRN
  Administered 2024-05-30: 0.2 mg via INTRAVENOUS
  Filled 2024-05-30: qty 1

## 2024-05-30 MED ORDER — FENTANYL BOLUS VIA INFUSION
100.0000 ug | INTRAVENOUS | Status: DC | PRN
  Administered 2024-05-30: 100 ug via INTRAVENOUS

## 2024-05-30 MED ORDER — SODIUM BICARBONATE 8.4 % IV SOLN
100.0000 meq | Freq: Once | INTRAVENOUS | Status: AC
  Administered 2024-05-30: 100 meq via INTRAVENOUS
  Filled 2024-05-30: qty 50

## 2024-05-30 MED ORDER — ONDANSETRON 4 MG PO TBDP: 4.0000 mg | ORAL_TABLET | Freq: Four times a day (QID) | ORAL | Status: DC | PRN

## 2024-05-30 MED ORDER — HALOPERIDOL LACTATE 5 MG/ML IJ SOLN: 2.0000 mg | Freq: Four times a day (QID) | INTRAMUSCULAR | Status: DC | PRN

## 2024-05-30 MED ORDER — ACETAMINOPHEN 325 MG PO TABS: 650.0000 mg | ORAL_TABLET | Freq: Four times a day (QID) | ORAL | Status: DC | PRN

## 2024-05-30 MED ORDER — MIDAZOLAM-SODIUM CHLORIDE 100-0.9 MG/100ML-% IV SOLN
10.0000 mg/h | INTRAVENOUS | Status: DC
  Administered 2024-05-30: 10 mg/h via INTRAVENOUS
  Filled 2024-05-30: qty 100

## 2024-05-30 MED ORDER — ONDANSETRON HCL 4 MG/2ML IJ SOLN: 4.0000 mg | Freq: Four times a day (QID) | INTRAMUSCULAR | Status: DC | PRN

## 2024-05-30 MED ORDER — GLYCOPYRROLATE 1 MG PO TABS: 1.0000 mg | ORAL_TABLET | ORAL | Status: DC | PRN

## 2024-05-30 MED ORDER — DIPHENHYDRAMINE HCL 50 MG/ML IJ SOLN: 25.0000 mg | INTRAMUSCULAR | Status: DC | PRN

## 2024-05-30 MED ORDER — OXIDIZED CELLULOSE EX PADS
1.0000 | MEDICATED_PAD | Freq: Once | CUTANEOUS | Status: AC
  Administered 2024-05-30: 1 via TOPICAL
  Filled 2024-05-30: qty 1

## 2024-05-30 MED ORDER — FENTANYL BOLUS VIA INFUSION
400.0000 ug | Freq: Once | INTRAVENOUS | Status: AC
  Administered 2024-05-30: 400 ug via INTRAVENOUS

## 2024-05-30 MED ORDER — HEPARIN (PORCINE) 25000 UT/250ML-% IV SOLN
600.0000 [IU]/h | INTRAVENOUS | Status: DC
  Administered 2024-05-30: 600 [IU]/h via INTRAVENOUS
  Filled 2024-05-30: qty 250

## 2024-05-30 MED ORDER — BIOTENE DRY MOUTH MT LIQD: 15.0000 mL | Freq: Two times a day (BID) | OROMUCOSAL | Status: DC

## 2024-05-30 MED ORDER — POLYVINYL ALCOHOL 1.4 % OP SOLN: 1.0000 [drp] | Freq: Four times a day (QID) | OPHTHALMIC | Status: DC | PRN

## 2024-05-30 MED ORDER — ACETAMINOPHEN 325 MG PO TABS: 650.0000 mg | ORAL_TABLET | ORAL | Status: DC | PRN

## 2024-05-30 MED ORDER — INSULIN ASPART 100 UNIT/ML IJ SOLN
0.0000 [IU] | INTRAMUSCULAR | Status: DC
  Administered 2024-05-30: 2 [IU] via SUBCUTANEOUS
  Filled 2024-05-30: qty 2

## 2024-05-30 MED ORDER — MIDAZOLAM HCL (PF) 2 MG/2ML IJ SOLN
8.0000 mg | Freq: Once | INTRAMUSCULAR | Status: AC
  Administered 2024-05-30: 8 mg via INTRAVENOUS
  Filled 2024-05-30: qty 8

## 2024-05-30 MED ORDER — INSULIN GLARGINE 100 UNIT/ML ~~LOC~~ SOLN
15.0000 [IU] | Freq: Every day | SUBCUTANEOUS | Status: DC
  Administered 2024-05-30: 15 [IU] via SUBCUTANEOUS
  Filled 2024-05-30: qty 0.15

## 2024-05-30 MED ORDER — HALOPERIDOL 1 MG PO TABS: 2.0000 mg | ORAL_TABLET | Freq: Four times a day (QID) | ORAL | Status: DC | PRN

## 2024-06-01 LAB — ECHO INTRAOPERATIVE TEE
Calc EF: 36.2 %
Height: 73 in
Single Plane A2C EF: 33.3 %
Single Plane A4C EF: 37.5 %
Weight: 3234.59 [oz_av]

## 2024-06-02 LAB — TYPE AND SCREEN
ABO/RH(D): A POS
Antibody Screen: NEGATIVE
Unit division: 0
Unit division: 0
Unit division: 0
Unit division: 0

## 2024-06-02 LAB — POCT I-STAT 7, (LYTES, BLD GAS, ICA,H+H)
Acid-base deficit: 5 mmol/L — ABNORMAL HIGH (ref 0.0–2.0)
Bicarbonate: 21.6 mmol/L (ref 20.0–28.0)
Calcium, Ion: 1.22 mmol/L (ref 1.15–1.40)
HCT: 23 % — ABNORMAL LOW (ref 39.0–52.0)
Hemoglobin: 7.8 g/dL — ABNORMAL LOW (ref 13.0–17.0)
O2 Saturation: 100 %
Potassium: 4.3 mmol/L (ref 3.5–5.1)
Sodium: 142 mmol/L (ref 135–145)
TCO2: 23 mmol/L (ref 22–32)
pCO2 arterial: 44.3 mmHg (ref 32–48)
pH, Arterial: 7.296 — ABNORMAL LOW (ref 7.35–7.45)
pO2, Arterial: 321 mmHg — ABNORMAL HIGH (ref 83–108)

## 2024-06-02 LAB — BPAM RBC
Blood Product Expiration Date: 202601152359
Blood Product Expiration Date: 202601162359
Blood Product Expiration Date: 202601162359
Blood Product Expiration Date: 202601182359
ISSUE DATE / TIME: 202512261239
ISSUE DATE / TIME: 202512261239
ISSUE DATE / TIME: 202512261239
ISSUE DATE / TIME: 202512261239
Unit Type and Rh: 6200
Unit Type and Rh: 6200
Unit Type and Rh: 6200
Unit Type and Rh: 6200

## 2024-06-02 LAB — POCT ACTIVATED CLOTTING TIME: Activated Clotting Time: 209 s

## 2024-06-03 LAB — POCT I-STAT 7, (LYTES, BLD GAS, ICA,H+H)
Acid-base deficit: 6 mmol/L — ABNORMAL HIGH (ref 0.0–2.0)
Acid-base deficit: 7 mmol/L — ABNORMAL HIGH (ref 0.0–2.0)
Bicarbonate: 17.9 mmol/L — ABNORMAL LOW (ref 20.0–28.0)
Bicarbonate: 18.1 mmol/L — ABNORMAL LOW (ref 20.0–28.0)
Calcium, Ion: 1.21 mmol/L (ref 1.15–1.40)
Calcium, Ion: 1.14 mmol/L — ABNORMAL LOW (ref 1.15–1.40)
HCT: 24 % — ABNORMAL LOW (ref 39.0–52.0)
HCT: 28 % — ABNORMAL LOW (ref 39.0–52.0)
Hemoglobin: 8.2 g/dL — ABNORMAL LOW (ref 13.0–17.0)
Hemoglobin: 9.5 g/dL — ABNORMAL LOW (ref 13.0–17.0)
O2 Saturation: 99 %
O2 Saturation: 99 %
Patient temperature: 36.2
Patient temperature: 36.6
Potassium: 3.6 mmol/L (ref 3.5–5.1)
Potassium: 3.7 mmol/L (ref 3.5–5.1)
Sodium: 143 mmol/L (ref 135–145)
Sodium: 143 mmol/L (ref 135–145)
TCO2: 19 mmol/L — ABNORMAL LOW (ref 22–32)
TCO2: 19 mmol/L — ABNORMAL LOW (ref 22–32)
pCO2 arterial: 27.6 mmHg — ABNORMAL LOW (ref 32–48)
pCO2 arterial: 33.9 mmHg (ref 32–48)
pH, Arterial: 7.417 (ref 7.35–7.45)
pH, Arterial: 7.332 — ABNORMAL LOW (ref 7.35–7.45)
pO2, Arterial: 152 mmHg — ABNORMAL HIGH (ref 83–108)
pO2, Arterial: 133 mmHg — ABNORMAL HIGH (ref 83–108)

## 2024-06-04 NOTE — Progress Notes (Addendum)
 "  Palliative Medicine Inpatient Follow Up Note   HPI: 69 y.o. male  with past medical history significant of diabetes and chronic kidney disease admitted on 05/21/2024 with chest pain/STEMI.    Patient has no personal history of cardiac disease.  He developed substernal chest pain at church on Sunday.  His symptoms lasted several hours and then dissipated.  He called EMS today due to recurrent symptoms and a code STEMI was paged out.  No other recent health issues denies any surgeries, history of stroke, or history of bleeding.  He takes insulin  and oral hypoglycemic for treatment of diabetes.   Significant events: 05/21/2024: LHC 100%.  LAD treated with 3 overlapping DES, residual 90%.  Diagonal post PCI.  LVEDP 24 mmHg. 05/22/2024: IABP placed for progressive shock 05/23/2024: Cannulated for VA ECMO. 05/27/2024: TEE 05/28/2024: Impella 5.5 place. IABP removed.    PMT has been consulted to assist with goals of care conversation. Patient/Family face treatment option decisions, advanced directive decisions and anticipatory care needs.    Family face treatment option decision, advance directive decisions and anticipatory care needs.   This is hospital day #: 9  Today's Discussion 06/21/24  I have reviewed medical records including:  EPIC notes: Reviewed overnight cardiology progress note from 21-Jun-2024, patient developed progressive increasing vasopressor requirements, needing Levophed , epi and vaso.  No pericardial effusion.  Reviewed cardiologist progress note from 05/29/2024, patient post ECMO decannulation.  Reviewed critical care provider progress note from 05/29/2024 detailing current treatment plan for cardiogenic shock. Continues to require Impella support P4. Went into DKA, started insulin  drip.  Reviewed to track clinical course and prognostication.  MAR: Reviewed PRN meds received over the last 24 hours. Reviewed to assess needs for medication adjustment to optimize comfort.   Current infusion/drips: Amiodarone  60 mg an hour, Precedex  0.6 mcg/kg/h, epinephrine  7 mcg/min, insulin  drip at 2.2 units/h, Levophed  at 10 mcg/min, vasopressin  at 0.04 units/min. Available advanced directives in ACP: None   Met with patient/family to discuss diagnosis prognosis, GOC, EOL wishes, disposition and options.  Created space and opportunity for patient to explore thoughts feelings and fears regarding current medical situation.  Visited patient at bedside today.  Patient's fianc and patient's sister present at bedside.  Patient lying in bed, chronically ill-appearing, intubated and sedated.  Breathing comfortably on mechanical vent.  No signs of pain or discomfort.  Following commands.  I introduced myself to the patient's sister and fianc in my role as the palliative medicine provider and provided an update on patient's current clinical course.  We discussed that the patient remains critically ill and is undergoing aggressive, intensive medical interventions with a goal of clinical improvement.  Goals of care were revisited with both the fianc and sister and remain unchanged.  With continuation of current medical management in hopes of improvement.  The fianc expressed ongoing hope while acknowledging that recovery is expected to be prolonged and that the patient is high risk for further decompensation.  Both stated they are taking the situation 1 day at a time, baby steps.  The sister shared her hope that the patient will be able to be liberated from the mechanical ventilation, become more alert and oriented, and participate in his own goals of care discussions, noting that she does not want to make decisions that would conflict with his wishes.  This concern was acknowledged and validated.  The fianc also provided brief personal background, sharing that the patient is very knowledgeable about basketball and baseball, tends to be  introverted and quiet, and is very well liked by his  colleagues at work.  Both the sister and fianc became emotional while reflecting on the patient's character and shared memories.  I shared my concern for patient's high risk for decompensation.   We discussed that, given the severity of your illness, the road to recovery may be difficult and unpredictable. Progress can be slow and there may be periods of improvement followed by setbacks or worsening symptoms. Our goal is to support the patient and family through these challenges, help manage symptoms, and adjust the care plan as needed to maintain the best possible quality of life.   Discussed with family the importance of continued conversation with each other and the medical providers regarding overall plan of care and treatment options, ensuring decisions are within the context of the patients values and GOCs.    Patient and her family face treatment option decisions, advanced directive decisions and anticipatory care needs.   Coordinated and discussed plan of care with ICU RN.  RN reports patient's clinical course over the last 24 hours.  Patient required increasing pressors overnight.  Plan is to extubate today and monitor clinical progress. Questions and concerns addressed   Update 1600: Bedside RN notified me that the patient has been extubated, is alert and oriented, with family at bedside.  The patient expressed that he no longer wishes to continue any form of mechanical life support and requested that goals of care be revisited.  I subsequently evaluated the patient at the bedside in the presence of his sister, Shawnee, and brother-in-law.  The patient was resting comfortably in a Semi-Fowler's position, eyes closed, without any signs of acute distress.  He was alert and oriented x 3, unable to participate meaningfully in conversation.  Goals of care reviewed, and the patient clearly stated, in front of his family, that he wishes to forego all life-prolonging interventions, including  mechanical life support.  He demonstrated clear understanding that discontinuation of current medical interventions would likely result in imminent death and verbalized acceptance of this outcome, stating he is ready to go to his heavenly home and reunite with his deceased mother.  His wishes were reconfirmed multiple times and remained consistent.  I explained that the palliative care team would support and honor his decision and focus on ensuring peaceful and symptom-controlled transition.  Dr. Bensimhon also met with patient to discuss the current plan and next steps.  The patient clearly affirmed his desire to transition to full comfort measures.  Family members were visibly emotional and stated they respect his decision to rest.  I provided education to the patient and family regarding the transition to comfort focused care including an emphasis on symptom management and quality of life.   Palliative Support Provided.   Objective Assessment: Vital Signs Vitals:   06-10-2024 0815 2024-06-10 0830  BP:    Pulse: 83   Resp: 17 19  Temp: (!) 100.8 F (38.2 C) (!) 100.8 F (38.2 C)  SpO2: 98%     Intake/Output Summary (Last 24 hours) at 2024-06-10 9078 Last data filed at 06-10-2024 0900 Gross per 24 hour  Intake 4981.06 ml  Output 1058 ml  Net 3923.06 ml   Last Weight  Most recent update: 06-10-24  6:57 AM    Weight  94.5 kg (208 lb 5.4 oz)             Physical Exam Constitutional:      Appearance: He is ill-appearing and toxic-appearing.  Comments: Sedated, intubated  HENT:     Head: Normocephalic and atraumatic.     Mouth/Throat:     Mouth: Mucous membranes are moist.  Cardiovascular:     Comments: On [place] support.  Pulmonary:     Comments: On mechanical vent support. Abdominal:     General: Abdomen is flat. Bowel sounds are normal.     Palpations: Abdomen is soft.  Genitourinary:    Comments: Foley in place.  Skin:    General: Skin is warm.   Neurological:     Mental Status: Mental status is at baseline.     Comments: sedated  Psychiatric:        Mood and Affect: Mood normal.     SUMMARY OF RECOMMENDATIONS    # Complex medical decision-making/goals of care  Code Status: Transition to DNR-Comfort Anticipate hospital death Continue to provide psycho-social and emotional support to patient and family Palliative medicine team will continue to follow.  Comfort cart for family Unrestricted visitations in the setting of EOL (per policy)  #Symptom Management: Acetaminophen  PRN for mild pain Fentanyl  drip and PRN boluses for pain/air hunger/restlessness Midazolam  drip to attain desired level of sedation to optimize comfort (per cardiologist) Midazolam  PRN for agitation/restlessness Robinul  PRN for excessive secretions Zofran  PRN for nausea Liquifilm tears PRN for dry eyes Haldol  PRN for agitation/anxiety May have comfort feeding Oxygen for comfort. No escalation.    # Psychosocial support Continue to provide psycho-social and emotional support to patient and family.  # Discharge planning  To be determined.  # Prognosis  Prognosis is poor given patient's high disease burden and high risk for decompensation.    # Treatment plan discussed with:  Patient's fiance Burnard, patient's sister Shawnee, nursing staff and medical team.    Thank you for allowing us  to participate in the care of Philip Richardson.  I personally spent a total of 50 minutes in the care of the patient today including preparing to see the patient, getting/reviewing separately obtained history, performing a medically appropriate exam/evaluation, counseling and educating, referring and communicating with other health care professionals, documenting clinical information in the EHR, independently interpreting results, and coordinating care.    ______________________________________________________________________________________ Kathlyne Bolder NP-C [facility] Palliative Medicine Team Team Cell Phone: [phone] Please utilize secure chat with additional questions, if there is no response within 30 minutes please call the above phone number  Palliative Medicine Team providers are available by phone from 7am to 7pm daily and can be reached through the team cell phone.  Should this patient require assistance outside of these hours, please call the patient's attending physician.     "

## 2024-06-04 NOTE — Progress Notes (Signed)
 "    Advanced Heart Failure Rounding Note  Cardiologist: None   [facility] Cardiologist: Dr. Rolan  Chief Complaint: Late presenting anterior MI, acute CHF Patient Profile   Philip Richardson is a 69 y.o. male with history of poorly controlled DM II, HTN, HLD and CKD IIIb. Admitted with late presenting anterior STEMI and acute systolic CHF.  Significant events:   12/18: LHC 100% p LAD treated with 3 overlapping DES, residual 90% p diagonal post PCI. LVEDP 24 mmHg. 12/19 IABP placed for progressive shock 12/20 Cannulated for VA ECMO  12/24 TEE  12/25 Impella 5.5 place. IABP removed 12/26 VA ECMO decannulation  Subjective:    VA ECMO decannulated yesterday.  Impella 5.5 in place  Developed SIRS last night. Pressors increased. Given methylene blue . Now pressors back down   Intubated/sedated.  Impella at P-6 3.5 L flow waveforms ok   Back in NSR on IV amio   PAP: (12-35)/(7-26) 23/17 CVP:  [4 mmHg-27 mmHg] 12 mmHg CO:  [3.5 L/min-5.7 L/min] 3.9 L/min CI:  [1.8 L/min/m2-2.6 L/min/m2] 1.8 L/min/m2   Objective:    94.5 kg Body mass index is 27.49 kg/m.   Vital Signs:   Temp:  [96.3 F (35.7 C)-100.8 F (38.2 C)] 98.6 F (37 C) (12/27 1545) Pulse Rate:  [56-99] 84 (12/27 1545) Resp:  [0-30] 25 (12/27 1545) BP: (109-110)/(73-88) 110/73 (12/27 1110) SpO2:  [76 %-100 %] 100 % (12/27 1545) Arterial Line BP: (72-152)/(47-93) 143/74 (12/27 1545) FiO2 (%):  [40 %] 40 % (12/27 1200) Weight:  [94.5 kg] 94.5 kg (12/27 0500) Last BM Date : 05/24/24  Weight change: Filed Weights   05/28/24 0530 05/29/24 0540 2024-06-09 0500  Weight: 89.9 kg 91.7 kg 94.5 kg    Intake/Output:   Intake/Output Summary (Last 24 hours) at 06-09-24 1555 Last data filed at 06-09-2024 1500 Gross per 24 hour  Intake 4175.27 ml  Output 1062 ml  Net 3113.27 ml     Physical Exam   General:  Intubated/sedated HEENT: normal + ETT Neck: supple. RIJ swan Cor: Regular rate & rhythm. No rubs, gallops  or murmurs. Impella site ok  Lungs: clear Abdomen: soft, nontender, nondistended.Good bowel sounds. Extremities: no cyanosis, clubbing, rash, 1+ edema  groin sites ok  Neuro: intubated sedated  Telemetry   Sinus 60-70s Personally reviewed  Labs   CBC Recent Labs    06-09-24 0420 09-Jun-2024 0718 Jun 09, 2024 1352 06/09/24 1431  WBC 15.4*  --   --  11.3*  HGB 8.3*   < > 6.8* 7.0*  HCT 24.8*   < > 20.0* 21.4*  MCV 82.1  --   --  83.9  PLT 103*  --   --  84*   < > = values in this interval not displayed.   Basic Metabolic Panel Recent Labs    87/73/74 0414 05/29/24 0744 2024/06/09 0420 2024-06-09 0718 09-Jun-2024 0814 06-09-2024 1228 09-Jun-2024 1352  NA 141   < > 142   < > 142 143 143  K 4.9   < > 3.8   < > 3.9 4.0 3.7  CL 107   < > 108  --  110 111  --   CO2 17*   < > 19*  --  20* 19*  --   GLUCOSE 302*   < > 135*  --  123* 139*  --   BUN 51*   < > 63*  --  62* 65*  --   CREATININE 2.09*   < > 2.33*  --  2.31* 2.30*  --   CALCIUM  8.5*   < > 7.9*  --  7.8* 7.6*  --   MG 2.4  --  2.2  --   --   --   --   PHOS 5.5*  --  3.8  --   --   --   --    < > = values in this interval not displayed.   Liver Function Tests Recent Labs    05/29/24 0414 June 21, 2024 0420  AST 73* 81*  ALT 154* 108*  ALKPHOS 158* 131*  BILITOT 1.4* 1.3*  PROT 4.8* 4.6*  ALBUMIN  2.3* 2.5*   No results for input(s): LIPASE, AMYLASE in the last 72 hours. Cardiac Enzymes No results for input(s): CKTOTAL, CKMB, CKMBINDEX, TROPONINI in the last 72 hours.   BNP: BNP (last 3 results) No results for input(s): BNP in the last 8760 hours.  ProBNP (last 3 results) Recent Labs    05/21/24 1855  PROBNP 18,891.0*     D-Dimer Recent Labs    05/29/24 1805  DDIMER >20.00*   Hemoglobin A1C No results for input(s): HGBA1C in the last 72 hours.  Fasting Lipid Panel Recent Labs    June 21, 2024 0420  TRIG 73    Medications:   Scheduled Medications:  aspirin   81 mg Per Tube Daily   bisacodyl    10 mg Rectal Daily   Chlorhexidine  Gluconate Cloth  6 each Topical Q0600   clopidogrel   75 mg Per Tube Daily   feeding supplement (PROSource TF20)  60 mL Per Tube Daily   insulin  aspart  0-15 Units Subcutaneous Q4H   insulin  glargine  15 Units Subcutaneous Daily   lidocaine   1 patch Transdermal Q24H   melatonin  3 mg Per Tube QHS   metoCLOPramide  (REGLAN ) injection  5 mg Intravenous Q8H   multivitamin with minerals  1 tablet Per Tube Daily   pantoprazole  (PROTONIX ) IV  40 mg Intravenous QHS   QUEtiapine   50 mg Per Tube BID   rOPINIRole   0.25 mg Per Tube Daily   sodium chloride  flush  10-40 mL Intracatheter Q12H   thiamine   100 mg Per Tube Daily    Infusions:  sodium chloride  10 mL/hr at 05/29/24 1621   amiodarone  60 mg/hr (06-21-24 1500)   dexmedetomidine  (PRECEDEX ) IV infusion Stopped (2024-06-21 1146)   epinephrine  7 mcg/min (June 21, 2024 1500)   feeding supplement (VITAL 1.5 CAL) Stopped (05/28/24 0715)   heparin  600 Units/hr (06-21-24 1500)   meropenem  (MERREM ) IV Stopped (Jun 21, 2024 0944)   norepinephrine  (LEVOPHED ) Adult infusion 14 mcg/min (Mar 11, 2024 1500)   sodium bicarbonate  25 mEq (Impella PURGE) in dextrose  5 % 1000 mL bag     vancomycin  Stopped (05/29/24 1941)   vasopressin  0.04 Units/min (06-21-24 1500)    PRN Medications: sodium chloride , acetaminophen , fentaNYL  (SUBLIMAZE ) injection, guaiFENesin -dextromethorphan , midazolam  PF, ondansetron  (ZOFRAN ) IV, mouth rinse, phenol  Assessment/Plan   1. CAD: Late-presenting anterior STEMI, symptoms began on 12/14.  Cath was done showing occluded LAD that was treated with overlapping DES x 3.  There was residual 80% D1. Symptoms had a pleuritic character pre-cath, and he continues to have significant pleuritic chest pain (not improved by PCI).  ECG post-cath showed persistent anterior STE.  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  No significant pericardial effusion present.  - Continue ASA 81 and ticagrelor .  - Continue  Crestor  10 mg daily, has not been able to tolerate statins in the past so will start low dose.  - No s/s angina  2.  Acute systolic CHF: Ischemic cardiomyopathy. -> cardiogenic shock SCAI stage D - POCUS echo EF 25% - cMRI 30% + LV clot -  Started VA ECMO (12/20) with IABP vent.  - Failed ECMO wean - does not tolerate afterlaad reduction with milrinone  and /or DBA - TEE 12/24 EF < 20% RV severely down. No apical clot - Impella 5.5 placed 12/25. IABP removed - ECMO removed - Now on Impella 5.5 P-6 with 3.5L flow - Hemodynamics now most c/w RV failure (PAPI 0.6) but RV looks good on echo  - Continue supportive care.  - Not transplant or VAD candidate - Hgb 7.0 transfuse 1u  3. AKI on CKD stage 3b: Suspect diabetic nephropathy. Creatinine 2.2>2.7 (baseline appears to be low 2s) - Renal function worse 2.0 -> 2.3 - Continue to trend  - no need for CVVHD at this point. 4. AF with RVR - continue IV amio and bival - back in NSR. Continue IV amio 4. Type 2 DM: Poor control with HgbA1c 13.1.   - isugars up this am. CCM managing. LA 0.5 5. LV thrombus on cMRI  - bival on hold  - resolved on TEE 12/24 6. Elevated LFTs and LDH - concerning for hemolysis ECMO and IABP adjusted. Numbers improved 7. TIA - code stroke on 12/22 - CT ok. Symptoms now resolved.   CRITICAL CARE Performed by: Cherrie Sieving  Total critical care time: 50  minutes  Critical care time was exclusive of separately billable procedures and treating other patients.  Critical care was necessary to treat or prevent imminent or life-threatening deterioration.  Critical care was time spent personally by me (independent of midlevel providers or residents) on the following activities: development of treatment plan with patient and/or surrogate as well as nursing, discussions with consultants, evaluation of patient's response to treatment, examination of patient, obtaining history from patient or surrogate, ordering and  performing treatments and interventions, ordering and review of laboratory studies, ordering and review of radiographic studies, pulse oximetry and re-evaluation of patient's condition.   Length of Stay: 9  Sieving Cherrie, MD  06-11-2024, 3:55 PM  Advanced Heart Failure Team Pager [phone] (M-F; 7a - 5p)   Please visit [email or web address]: For overnight coverage please call cardiology fellow first. If fellow not available call Shock/ECMO MD on call.  For ECMO / Mechanical Support (Impella, IABP, LVAD) issues call Shock / ECMO MD on call.      "

## 2024-06-04 NOTE — Evaluation (Signed)
 Clinical/Bedside Swallow Evaluation Patient Details  Name: Philip Richardson MRN: [id number] Date of Birth: [birth year]  Today's Date: 2024-06-28 Time: SLP Start Time (ACUTE ONLY): 1435 SLP Stop Time (ACUTE ONLY): 1500 SLP Time Calculation (min) (ACUTE ONLY): 25 min  Past Medical History:  Past Medical History:  Diagnosis Date   Frequency of urination    GERD (gastroesophageal reflux disease)    Horseshoe kidney    BILATERAL   Hypertension    Renal calculus, bilateral    Type 2 diabetes mellitus (HCC)    Urgency of urination    Wears dentures    Past Surgical History:  Past Surgical History:  Procedure Laterality Date   ARTERIAL LINE INSERTION N/A 05/22/2024   Procedure: ARTERIAL LINE INSERTION;  Surgeon: Cherrie Toribio SAUNDERS, MD;  Location: [facility];  Service: Cardiovascular;  Laterality: N/A;   BIOPSY  04/25/2020   Procedure: BIOPSY;  Surgeon: Cindie Carlin POUR, DO;  Location: [facility];  Service: Endoscopy;;  duodenum gastric esophagus   COLONOSCOPY WITH PROPOFOL  N/A 04/25/2020   internal hemorrhoids, one 5 mm polyp in descending colon. Tubular adenoma. 5 year surveillance.   CORONARY/GRAFT ACUTE MI REVASCULARIZATION N/A 05/21/2024   Procedure: Coronary/Graft Acute MI Revascularization;  Surgeon: Wonda Sharper, MD;  Location: [facility] INVASIVE CV LAB;  Service: Cardiovascular;  Laterality: N/A;   CORONARY/GRAFT ACUTE MI REVASCULARIZATION N/A 05/23/2024   Procedure: Coronary/Graft Acute MI Revascularization;  Surgeon: Cherrie Toribio SAUNDERS, MD;  Location: [facility];  Service: Cardiovascular;  Laterality: N/A;   CYSTOSCOPY W/ URETERAL STENT PLACEMENT Bilateral 12/09/2013   Procedure: CYSTOSCOPY WITH RETROGRADE PYELOGRAM/URETERAL STENT PLACEMENT;  Surgeon: Ricardo Likens, MD;  Location: [facility];  Service: Urology;  Laterality: Bilateral;   CYSTOSCOPY WITH RETROGRADE PYELOGRAM, URETEROSCOPY AND STENT PLACEMENT Bilateral 09/30/2013   Procedure: CYSTOSCOPY WITH BILATERAL  RETROGRADE PYELOGRAM, LEFT DIAGNOSTIC URETEROSCOPY AND Left ureteral stent;  Surgeon: Ricardo Likens, MD;  Location: [facility];  Service: Urology;  Laterality: Bilateral;   ECMO CANNULATION  05/23/2024   Procedure: ECMO CANNULATION;  Surgeon: Cherrie Toribio SAUNDERS, MD;  Location: [facility];  Service: Cardiovascular;;   ESOPHAGOGASTRODUODENOSCOPY (EGD) WITH PROPOFOL  N/A 04/25/2020   Mildly severe candida esophagitis without bleed, s/p biopsy. Suspicion for eosinophilic esophagitis but no increased eosinophils. Gastritis. Reactive gastropathy. Negative H.pylori.  +KOH prep.    IABP INSERTION N/A 05/22/2024   Procedure: IABP Insertion;  Surgeon: Cherrie Toribio SAUNDERS, MD;  Location: [facility];  Service: Cardiovascular;  Laterality: N/A;   LEFT HEART CATH AND CORONARY ANGIOGRAPHY N/A 05/21/2024   Procedure: LEFT HEART CATH AND CORONARY ANGIOGRAPHY;  Surgeon: Wonda Sharper, MD;  Location: [facility] INVASIVE CV LAB;  Service: Cardiovascular;  Laterality: N/A;   PERCUTANEOUS NEPHROLITHOTRIPSY  2005   PLACEMENT OF IMPELLA LEFT VENTRICULAR ASSIST DEVICE Right 05/28/2024   Procedure: INSERTION, CARDIAC ASSIST DEVICE, IMPELLA 5.5;  Surgeon: Shyrl Linnie KIDD, MD;  Location: [facility] OR;  Service: Open Heart Surgery;  Laterality: Right;  11am start   POLYPECTOMY  04/25/2020   Procedure: POLYPECTOMY;  Surgeon: Cindie Carlin POUR, DO;  Location: [facility];  Service: Endoscopy;;  colon   RIGHT HEART CATH N/A 05/22/2024   Procedure: RIGHT HEART CATH;  Surgeon: Cherrie Toribio SAUNDERS, MD;  Location: [facility] INVASIVE CV LAB;  Service: Cardiovascular;  Laterality: N/A;   ROBOT ASSISTED PYELOPLASTY N/A 12/09/2013   Procedure: ROBOTIC ASSISTED BILATERAL PYELOLITHOTOMY, RIGHT  PYELOPLASTY ;  Surgeon: Ricardo Likens, MD;  Location: [facility];  Service: Urology;  Laterality: N/A;  HPI:  Philip Richardson 69 y.o. male admitted to the hospital on 12/18 with a STEMI.  He was taken to the cath lab for PCI to the  LAD with DES.  He remained unstable with continued chest pain, and thus IABP was placed.  While in the ICU, he developed progressive cardiogenic shock, and thus was pace on [place] ECMO on 12/20.  He has failed weaning, and thus CT has been consulted for impella placement.  ETT from 12/25-12/27.   Earlier this admission had R-sided weakness concerning for CVA; had resolution of symptoms with higher BP.  ST consulted for clinical swallow assessment/speech-language evaluation.    Assessment / Plan / Recommendation  Clinical Impression  Recommend initiate a conservative diet of Dysphagia 1(puree)/thin liquids with general swallow precautions when pt alert/responsive and FULL supervision/A provided for self-feeding to institute swallow precautions during all po intake.  ST will f/u for diet progression/education and speech/language cognitive assessment completion during acute stay.  [name] was seen for a clinical swallow evaluation with decreased responsiveness observed without mod verbal/tactile cues provided by SLP/nursing staff/PT and OT to maintain alertness level/follow swallow precautions during po intake.  Pt stated I want some water  when evaluation initiated and willing to place/wear dentures for solid foods/cooperate for oral care prior to po intake.  Pt with slow prolonged mastication with oral holding/retention d/t cognitive-based dysphagia.  A delay in the initiation of the swallow observed without mod verbal/tactile cues provided to increase swallow speed/accuracy.  Pt did not exhibit any overt s/s of aspiration during trial and vocal quality hypophonic, but clear during speaking attempts.  Strong cough response noted prior to, but not during po intake. No residue remained in oral cavity after solid consumption.  ST will f/u for dysphagia tx/speech-language cognitive assessment during acute stay.  Thank you for this consult. SLP Visit Diagnosis: Dysphagia, unspecified (R13.10)    Aspiration  Risk  Mild aspiration risk    Diet Recommendation   Thin;Dysphagia 1 (puree)  Medication Administration: Whole meds with puree    Other Recommendations Oral Care Recommendations: Oral care BID;Staff/trained caregiver to provide oral care     Swallow Evaluation Recommendations  PO diet (Dysphagia 1(puree)/thin liquids)   Assistance Recommended at Discharge  FULL  Functional Status Assessment Patient has had a recent decline in their functional status and demonstrates the ability to make significant improvements in function in a reasonable and predictable amount of time.  Frequency and Duration min 2x/week  1 week       Prognosis Prognosis for improved oropharyngeal function: Good      Swallow Study   General Date of Onset: 05/21/24 HPI: Otoniel Gombos 69 y.o. male admitted to the hospital on 12/18 with a STEMI.  He was taken to the cath lab for PCI to the LAD with DES.  He remained unstable with continued chest pain, and thus IABP was placed.  While in the ICU, he developed progressive cardiogenic shock, and thus was pace on [place] ECMO on 12/20.  He has failed weaning, and thus CT has been consulted for impella placement.  ETT from 12/25-12/27.   Earlier this admission had R-sided weakness concerning for CVA; had resolution of symptoms with higher BP.  ST consulted for clinical swallow assessment/speech-language evaluation. Type of Study: Bedside Swallow Evaluation Previous Swallow Assessment: n/a Diet Prior to this Study: NPO Temperature Spikes Noted: Yes (99, low grade) Respiratory Status: Room air History of Recent Intubation: Yes Total duration of intubation (days): 2 days Date extubated: 2024/06/16  Behavior/Cognition: Alert;Cooperative;Requires cueing Oral Cavity Assessment: Dry Oral Care Completed by SLP: Yes Oral Cavity - Dentition: Dentures, top;Dentures, bottom Self-Feeding Abilities: Able to feed self;Needs assist;Needs set up Patient Positioning: Upright in bed Baseline  Vocal Quality: Low vocal intensity Volitional Cough: Strong Volitional Swallow: Able to elicit    Oral/Motor/Sensory Function Overall Oral Motor/Sensory Function: Generalized oral weakness   Ice Chips Ice chips: Impaired Presentation: Spoon Oral Phase Impairments: Reduced lingual movement/coordination Oral Phase Functional Implications: Oral holding   Thin Liquid Thin Liquid: Impaired Presentation: Cup;Spoon Oral Phase Functional Implications: Oral holding    Nectar Thick Nectar Thick Liquid: Not tested   Honey Thick Honey Thick Liquid: Not tested   Puree Puree: Impaired Presentation: Spoon Oral Phase Impairments: Reduced lingual movement/coordination Oral Phase Functional Implications: Oral holding   Solid     Solid: Impaired Presentation: Spoon Oral Phase Impairments: Impaired mastication Oral Phase Functional Implications: Impaired mastication;Prolonged oral transit      Pat Ramondo Dietze,M.S.,CCC-SLP 2024-06-01,3:23 PM

## 2024-06-04 NOTE — Procedures (Signed)
 Extubation Procedure Note  Patient Details:   Name: Philip Richardson DOB: 1955/12/15 MRN: [id number]   Airway Documentation:    Vent end date: Jun 23, 2024 Vent end time: 1250   Evaluation  O2 sats: stable throughout Complications: No apparent complications Patient did tolerate procedure well. Bilateral Breath Sounds: Clear   Yes  Canyon Willow 06/23/24, 12:51 PM

## 2024-06-04 NOTE — Progress Notes (Signed)
 ANTICOAGULATION CONSULT NOTE  Pharmacy Consult for heparin  Indication: Impella 5.5  Allergies[1]  Patient Measurements: Height: 6' 1 (185.4 cm) Weight: 94.5 kg (208 lb 5.4 oz) IBW/kg (Calculated) : 79.9 Heparin  Dosing Weight: 84 kg HEPARIN  DW (KG): 94.5  Vital Signs: Temp: 100 F (37.8 C) (12/27 1130) Temp Source: Core (12/27 0800) Pulse Rate: 82 (12/27 1045)  Labs: Recent Labs    05/29/24 0414 05/29/24 0744 05/29/24 1805 05/29/24 1944 06-05-24 0123 06-05-24 0420 June 05, 2024 0718 2024-06-05 0814  HGB 8.9*   < >  --  9.0* 8.8* 8.3* 7.8*  --   HCT 27.6*   < >  --  26.5* 26.8* 24.8* 23.0*  --   PLT 113*   < > 100* 94* 106* 103*  --   --   APTT 46*   < > >200* 56*  --  39*  --   --   LABPROT 20.9*  --  22.6*  --   --  21.6*  --   --   INR 1.7*  --  1.9*  --   --  1.8*  --   --   CREATININE 2.09*   < >  --  2.11* 2.24* 2.33*  --  2.31*   < > = values in this interval not displayed.    Estimated Creatinine Clearance: 34.6 mL/min (A) (by C-G formula based on SCr of 2.31 mg/dL (H)).  Medical History: Past Medical History:  Diagnosis Date   Frequency of urination    GERD (gastroesophageal reflux disease)    Horseshoe kidney    BILATERAL   Hypertension    Renal calculus, bilateral    Type 2 diabetes mellitus (HCC)    Urgency of urination    Wears dentures     Assessment: 35 yoM presents as late presenting STEMI s/p overlapping DES x3 to LAD with ischemic CMP (EF 40% TTE, 30% cMRI) with LV thrombus on cMRI.  Not on anticoagulation prior to admission.  Pharmacy consulted for heparin  dosing. Pt s/p IABP placement and then [facility]-ECMO cannulation on 12/20. Pharmacy to transition to bivalirudin .  Patient now s/p [facility] ECMO, Impella 5.5 placed in OR on 12/25. Had continued oozing from femoral sites after OR, required protamine  to reverse heparin  given in OR during repair of femoral arteries and decannulation. Hgb (7.8) and plts (103) are low stable. LDH stable in 500s.  Now to start  heparin  for Impella 5.5, will monitor closely for bleeding. Per [facility] MD, will start low-dose fixed rate heparin . Monitor closely for increased oozing from femoral sites.  Goal of Therapy:  Heparin  level < 0.1 Monitor platelets by anticoagulation protocol: Yes   Plan:  Start heparin  infusion at 600 units/hr - fixed rate, no titration Check 8 hour heparin  level Monitor daily heparin  level, CBC, and signs/symptoms of bleeding  Thank you for allowing pharmacy to be a part of this patients care.   Nidia Schaffer, PharmD PGY2 Cardiology Pharmacy Resident  Please check AMION for all [facility] Pharmacy phone numbers After 10:00 PM, call Main Pharmacy [phone] 05-Jun-2024  11:46 AM    [1]  Allergies Allergen Reactions   Atorvastatin  Other (See Comments)    Myopathy/weakness   Invokana  [Canagliflozin ] Other (See Comments)    weakness   Semaglutide  Other (See Comments)    Heartburn

## 2024-06-04 NOTE — Progress Notes (Signed)
 PT Cancellation Note  Patient Details Name: Philip Richardson MRN: [id number] DOB: [birth year]   Cancelled Treatment:    Reason Eval/Treat Not Completed: (P) Patient not medically ready. Uncontrolled HR currently. Will hold off on PT at this time and plan to follow-up as able.   Theo Ferretti, PT, DPT Acute Rehabilitation Services  Office: [phone]    Theo CHRISTELLA Ferretti 06/26/24, 10:51 AM

## 2024-06-04 NOTE — Progress Notes (Signed)
 [facility] [place] PROGRESS NOTE  ASSESSMENT / PLAN: Philip Richardson is a 69 y.o. male status post decannulation of ECMO in OR yesterday.  Some ecchymosis about the right groin.  No hematoma in either groin appreciated.  Doppler flow noted in bilateral feet.  Will check in periodically to evaluate the incisions.  SUBJECTIVE: Intubated and sedated.  Family at bedside.  Reviewed operative details with family.  OBJECTIVE: BP 127/70   Pulse 98   Temp (!) 100.4 F (38 C)   Resp 20   Ht 6' 1 (1.854 m)   Wt 94.5 kg   SpO2 100%   BMI 27.49 kg/m   Intake/Output Summary (Last 24 hours) at June 28, 2024 1033 Last data filed at June 28, 2024 1010 Gross per 24 hour  Intake 4894.29 ml  Output 1047 ml  Net 3847.29 ml    Intubated and sedated Left groin bandage with some mild serosanguineous strikethrough Right groin with some ecchymosis Doppler flow in dorsalis pedis arteries bilaterally     Latest Ref Rng & Units 06-28-2024    7:18 AM 28-Jun-2024    4:20 AM 2024/06/28    1:23 AM  CBC  WBC 4.0 - 10.5 K/uL  15.4  15.2   Hemoglobin 13.0 - 17.0 g/dL 7.8  8.3  8.8   Hematocrit 39.0 - 52.0 % 23.0  24.8  26.8   Platelets 150 - 400 K/uL  103  106         Latest Ref Rng & Units 06-28-24    8:14 AM Jun 28, 2024    7:18 AM Jun 28, 2024    4:20 AM  CMP  Glucose 70 - 99 mg/dL 876   864   BUN 8 - 23 mg/dL 62   63   Creatinine 9.38 - 1.24 mg/dL 7.68   7.66   Sodium 864 - 145 mmol/L 142  144  142   Potassium 3.5 - 5.1 mmol/L 3.9  3.9  3.8   Chloride 98 - 111 mmol/L 110   108   CO2 22 - 32 mmol/L 20   19   Calcium  8.9 - 10.3 mg/dL 7.8   7.9   Total Protein 6.5 - 8.1 g/dL   4.6   Total Bilirubin 0.0 - 1.2 mg/dL   1.3   Alkaline Phos 38 - 126 U/L   131   AST 15 - 41 U/L   81   ALT 0 - 44 U/L   108     Estimated Creatinine Clearance: 34.6 mL/min (A) (by C-G formula based on SCr of 2.31 mg/dL (H)).   Debby SAILOR. Magda, MD [facility] Phone Number: [phone] 06-28-24 10:33 AM

## 2024-06-04 NOTE — Progress Notes (Signed)
" ° °  Patient met with Palliative Care Team and was adamant that he was done and wanted to go see my momma.   We discussed the potential for cardiac recovery over time but he reiterated that he was tired and wanted us  to stop doing things to him.   He was completely alert and oriented at the time.   Conversation witnessed by Palliative Team as well as patient's sister and brother-in-law. All were clear that we respect his wishes.   Will proceed with comfort drips and withdrawal of support.   Additional CCT 55 mins.   Toribio Fuel, MD  4:41 PM  "

## 2024-06-04 NOTE — Plan of Care (Signed)
 " Problem: Education: Goal: Knowledge of General Education information will improve Description: Including pain rating scale, medication(s)/side effects and non-pharmacologic comfort measures Outcome: Progressing   Problem: Health Behavior/Discharge Planning: Goal: Ability to manage health-related needs will improve Outcome: Progressing   Problem: Clinical Measurements: Goal: Ability to maintain clinical measurements within normal limits will improve Outcome: Progressing Goal: Will remain free from infection Outcome: Progressing Goal: Diagnostic test results will improve Outcome: Progressing Goal: Respiratory complications will improve Outcome: Progressing Goal: Cardiovascular complication will be avoided Outcome: Progressing   Problem: Activity: Goal: Risk for activity intolerance will decrease Outcome: Progressing   Problem: Nutrition: Goal: Adequate nutrition will be maintained Outcome: Progressing   Problem: Coping: Goal: Level of anxiety will decrease Outcome: Progressing   Problem: Elimination: Goal: Will not experience complications related to bowel motility Outcome: Progressing Goal: Will not experience complications related to urinary retention Outcome: Progressing   Problem: Pain Managment: Goal: General experience of comfort will improve and/or be controlled Outcome: Progressing   Problem: Safety: Goal: Ability to remain free from injury will improve Outcome: Progressing   Problem: Skin Integrity: Goal: Risk for impaired skin integrity will decrease Outcome: Progressing   Problem: Education: Goal: Ability to describe self-care measures that may prevent or decrease complications (Diabetes Survival Skills Education) will improve Outcome: Progressing Goal: Individualized Educational Video(s) Outcome: Progressing   Problem: Coping: Goal: Ability to adjust to condition or change in health will improve Outcome: Progressing   Problem: Fluid  Volume: Goal: Ability to maintain a balanced intake and output will improve Outcome: Progressing   Problem: Health Behavior/Discharge Planning: Goal: Ability to identify and utilize available resources and services will improve Outcome: Progressing Goal: Ability to manage health-related needs will improve Outcome: Progressing   Problem: Metabolic: Goal: Ability to maintain appropriate glucose levels will improve Outcome: Progressing   Problem: Nutritional: Goal: Maintenance of adequate nutrition will improve Outcome: Progressing Goal: Progress toward achieving an optimal weight will improve Outcome: Progressing   Problem: Skin Integrity: Goal: Risk for impaired skin integrity will decrease Outcome: Progressing   Problem: Tissue Perfusion: Goal: Adequacy of tissue perfusion will improve Outcome: Progressing   Problem: Education: Goal: Understanding of cardiac disease, CV risk reduction, and recovery process will improve Outcome: Progressing Goal: Individualized Educational Video(s) Outcome: Progressing   Problem: Activity: Goal: Ability to tolerate increased activity will improve Outcome: Progressing   Problem: Cardiac: Goal: Ability to achieve and maintain adequate cardiovascular perfusion will improve Outcome: Progressing   Problem: Health Behavior/Discharge Planning: Goal: Ability to safely manage health-related needs after discharge will improve Outcome: Progressing   Problem: Education: Goal: Understanding of CV disease, CV risk reduction, and recovery process will improve Outcome: Progressing Goal: Individualized Educational Video(s) Outcome: Progressing   Problem: Activity: Goal: Ability to return to baseline activity level will improve Outcome: Progressing   Problem: Cardiovascular: Goal: Ability to achieve and maintain adequate cardiovascular perfusion will improve Outcome: Progressing Goal: Vascular access site(s) Level 0-1 will be  maintained Outcome: Progressing   Problem: Health Behavior/Discharge Planning: Goal: Ability to safely manage health-related needs after discharge will improve Outcome: Progressing   Problem: Education: Goal: Knowledge of disease or condition will improve Outcome: Progressing Goal: Knowledge of secondary prevention will improve (MUST DOCUMENT ALL) Outcome: Progressing Goal: Knowledge of patient specific risk factors will improve (DELETE if not current risk factor) Outcome: Progressing   Problem: Ischemic Stroke/TIA Tissue Perfusion: Goal: Complications of ischemic stroke/TIA will be minimized Outcome: Progressing   Problem: Coping: Goal: Will verbalize positive feelings about self Outcome:  Progressing Goal: Will identify appropriate support needs Outcome: Progressing   Problem: Health Behavior/Discharge Planning: Goal: Ability to manage health-related needs will improve Outcome: Progressing Goal: Goals will be collaboratively established with patient/family Outcome: Progressing   "

## 2024-06-04 NOTE — Progress Notes (Addendum)
 Occupational Therapy Re-eval  Patient Details Name: Philip Richardson MRN: [id number] DOB: Oct 28, 1955 Today's Date: 2024-06-23   History of present illness 69 yo admitted 12/18 after presenting as code STEMI. Persistent pain and cardiogenic shock post cath . IABP placed;  cannulated for VA ECMO 12/20-12/26.  Probable LV thrombus. Extubated 12/27. PMH:T2DM, GERD, HTN, HLD, CKD IIIb   OT comments  Re-eval completed since pt now de-cannulated and extubated. Re-eval completed bed level for safety with pt's HDs. Today pt is requiring set up  to max  assist for ADLs. Pt mod +2 assist to pull chest off bed, and min assist to sustain position with single UE support. Pt alert, however sustaining eyes closed most of session. Required significant cueing and effort to open eyes and sustain gaze. Pt tracking people in room, but observed down-beating nystagmus with reading at distance. Nystagmus persisting through rest of session. Pt having difficulty completing saccades. Denies any blurred or double vision, but eyes water  with strain, and continued preference for eyes closed. Recommending continued visual assessment in future sessions, as pt's tolerance for visual stimuli increases. Pt eager to mobilize and return to PLOF. Anticipate pt would tolerate and progress well with intensive therapy. Recommending>3 hours of skilled rehab daily. OT will continue to follow acutely to maximize functional independence.  Addendum 1620: Per RN pt now comfort care, will d/c OT orders.       If plan is discharge home, recommend the following:  Two people to help with walking and/or transfers;Two people to help with bathing/dressing/bathroom;Assistance with cooking/housework   Equipment Recommendations  Other (comment) (Defer to next venue)    Recommendations for Other Services Rehab consult    Precautions / Restrictions Precautions Precautions: Fall Recall of Precautions/Restrictions: Impaired Precaution/Restrictions  Comments: swanz, chest tube, impella Restrictions Weight Bearing Restrictions Per Provider Order: No       Mobility Bed Mobility Overal bed mobility: Needs Assistance       General bed mobility comments: Transitioned bed to egress to monitor HDs with activity. Mod assist +2 to lift trunk off bed.    Transfers     General transfer comment: deferred for safety     Balance Overall balance assessment: Needs assistance Sitting-balance support: Single extremity supported Sitting balance-Leahy Scale: Poor Sitting balance - Comments: In egress min assist to maintain chest off bed       ADL either performed or assessed with clinical judgement   ADL Overall ADL's : Needs assistance/impaired Eating/Feeding: Set up;Bed level   Grooming: Wash/dry face;Set up;Bed level   Upper Body Bathing: Set up;Bed level   Lower Body Bathing: Maximal assistance   Upper Body Dressing : Set up;Sitting   Lower Body Dressing: Maximal assistance       General ADL Comments: Limited to bed level ADLs out of safety with HR and MAP    Extremity/Trunk Assessment Upper Extremity Assessment Upper Extremity Assessment: Generalized weakness (Noted edema in bil hands, repositioned with pillows)   Lower Extremity Assessment Lower Extremity Assessment: Defer to PT evaluation        Vision   Vision Assessment?: Vision impaired- to be further tested in functional context Additional Comments: Noted vertical downbeating nystagmus once asked to read at distance. Nystagmus sustaining with fixed attention. Pt often resting with eyes closed, required significant effort to track objects on command and complete saccades.         Communication Communication Communication: Impaired Factors Affecting Communication: Reduced clarity of speech   Cognition Arousal: Lethargic Behavior During Therapy: [facility]  for tasks assessed/performed Cognition: Difficult to assess Difficult to assess due to: Level of arousal            OT - Cognition Comments: Appears largely WFL, some difficulty following commands and sustaining arousal       Following commands: Impaired Following commands impaired: Follows multi-step commands with increased time      Cueing   Cueing Techniques: Verbal cues        General Comments RN present for session to monitor HDs. MAP briefly in 60s for ~2 minutes, but recovered to 70s. Pt with light sweating, but denies any fever, chills, or dizziness    Pertinent Vitals/ Pain       Pain Assessment Pain Assessment: Faces Pain Location: generalized Pain Descriptors / Indicators: Discomfort, Restless Pain Intervention(s): Limited activity within patient's tolerance   Frequency  Min 2X/week        Progress Toward Goals    Progress towards OT goals: Progressing toward goals         Co-evaluation    PT/OT/SLP Co-Evaluation/Treatment: Yes Reason for Co-Treatment: Complexity of the patient's impairments (multi-system involvement);For patient/therapist safety;To address functional/ADL transfers   OT goals addressed during session: ADL's and self-care      AM-PAC OT 6 Clicks Daily Activity     Outcome Measure   Help from another person eating meals?: A Little Help from another person taking care of personal grooming?: A Little Help from another person toileting, which includes using toliet, bedpan, or urinal?: Total Help from another person bathing (including washing, rinsing, drying)?: A Lot Help from another person to put on and taking off regular upper body clothing?: A Little Help from another person to put on and taking off regular lower body clothing?: A Lot 6 Click Score: 14    End of Session    OT Visit Diagnosis: Muscle weakness (generalized) (M62.81);Other abnormalities of gait and mobility (R26.89)   Activity Tolerance Patient tolerated treatment well   Patient Left in bed;with call bell/phone within reach;with nursing/sitter in room    Nurse Communication Mobility status        Time: 1427-1500 OT Time Calculation (min): 33 min  Charges: OT General Charges $OT Visit: 1 Visit OT Evaluation $OT Re-eval: 1 Re-eval  Adrianne BROCKS, OT  Acute Rehabilitation Services Office [phone] Secure chat preferred   Adrianne GORMAN Savers 06/25/24, 3:56 PM

## 2024-06-04 NOTE — Progress Notes (Signed)
 SLP Cancellation Note  Patient Details Name: Philip Richardson MRN: [id number] DOB: June 22, 1955   Cancelled treatment:       Reason Eval/Treat Not Completed: Medical issues which prohibited therapy; pt intubated when SLP attempted; will continue to monitor in acute setting for SLE.   Pat Carrol Bondar,M.S.,CCC-SLP 06/26/2024, 9:32 AM

## 2024-06-04 NOTE — Progress Notes (Signed)
 1 Day Post-Op Procedures (LRB): SUBXIPHOID PERICARDIAL WINDOW; DECANNULATION FOR  ECMO (EXTRACORPOREAL MEMBRANE OXYGENATION) AND ARTERIAL REPAIR (N/A) Subjective:  Hemodynamically unstable overnight requiring high dose NE up to 75 mcg to support BP, improved with vaso 0.04 and methylene blue . NE down to 8 and epi 7. Impella at p8 this am with flow 4L.  Objective: Vital signs in last 24 hours: Temp:  [96.3 F (35.7 C)-100.8 F (38.2 C)] 100.4 F (38 C) (12/27 1015) Pulse Rate:  [56-126] 98 (12/27 1015) Cardiac Rhythm: Normal sinus rhythm (12/27 0800) Resp:  [0-30] 20 (12/27 0945) SpO2:  [66 %-100 %] 100 % (12/27 1015) Arterial Line BP: (52-152)/(47-93) 115/67 (12/27 1015) FiO2 (%):  [40 %] 40 % (12/27 0800) Weight:  [94.5 kg] 94.5 kg (12/27 0500)  Hemodynamic parameters for last 24 hours: PAP: (17-43)/(11-26) 24/14 CVP:  [4 mmHg-34 mmHg] 15 mmHg CO:  [3.5 L/min-5.7 L/min] 3.5 L/min CI:  [2.17 L/min/m2-2.6 L/min/m2] 2.17 L/min/m2  Intake/Output from previous day: 12/26 0701 - 12/27 0700 In: 4935 [I.V.:2816.2; Blood:630; NG/GT:120; IV Piggyback:1130.7] Out: 1076 [Urine:928; Blood:10; Chest Tube:138] Intake/Output this shift: Total I/O In: 405.5 [I.V.:272.1; Other:33.4; IV Piggyback:100] Out: 166 [Urine:141; Emesis/NG output:5; Chest Tube:20]  General appearance: sedated on vent Heart: regular rate and rhythm Lungs: clear to auscultation bilaterally Abdomen: soft Wound: Impella dressing with some dried blood. Pericardial tube output minimal serosanguinous. Some bloody drainage around the tube on dressing.  Lab Results: Recent Labs    June 16, 2024 0123 06/16/2024 0420 June 16, 2024 0718  WBC 15.2* 15.4*  --   HGB 8.8* 8.3* 7.8*  HCT 26.8* 24.8* 23.0*  PLT 106* 103*  --    BMET:  Recent Labs    Jun 16, 2024 0420 06/16/2024 0718 June 16, 2024 0814  NA 142 144 142  K 3.8 3.9 3.9  CL 108  --  110  CO2 19*  --  20*  GLUCOSE 135*  --  123*  BUN 63*  --  62*  CREATININE 2.33*  --   2.31*  CALCIUM  7.9*  --  7.8*    PT/INR:  Recent Labs    06-16-2024 0420  LABPROT 21.6*  INR 1.8*   ABG    Component Value Date/Time   PHART 7.428 Jun 16, 2024 0718   HCO3 17.8 (L) 06-16-2024 0718   TCO2 19 (L) 06/16/24 0718   ACIDBASEDEF 5.0 (H) 16-Jun-2024 0718   O2SAT 99 2024/06/16 0718   CBG (last 3)  Recent Labs    16-Jun-2024 0814 2024/06/16 0908 2024-06-16 1015  GLUCAP 125* 125* 123*    Assessment/Plan: S/P Procedures (LRB): SUBXIPHOID PERICARDIAL WINDOW; DECANNULATION FOR  ECMO (EXTRACORPOREAL MEMBRANE OXYGENATION) AND ARTERIAL REPAIR (N/A)  Will keep pericardial tube in today and probably remove tomorrow.  Other plans per CCM and AHF team.   LOS: 9 days    Philip Richardson 06/16/2024

## 2024-06-04 NOTE — Progress Notes (Signed)
 Patient developed progressive increase in vasopressor requirements, needing levophed , epi, and vaso. CVP 11-13 with CI in the low 2s. Bedside POCUS with normal RV function , severely reduced LV function, and Impella 5.5 in appropriate position; no pericardial effusion.   Continuing Vanc and meropenem  for empiric coverage given concern of septic shock. No signs of bleeding and Hgb is stable (8.8 from 9.0). Likely progressive cardiogenic shock. Discussed with Dr. Bensimhon, and patient would not be candidate for MCS escalation.

## 2024-06-04 NOTE — Progress Notes (Signed)
 OT Cancellation Note  Patient Details Name: Philip Richardson MRN: [id number] DOB: 1955-10-09   Cancelled Treatment:    Reason Eval/Treat Not Completed: Medical issues which prohibited therapy. HR uncontrolled currently. Will hold on therapy, and follow up as able to.   Younique Casad C, OT  Acute Rehabilitation Services Office [phone] Secure chat preferred   Adrianne GORMAN Savers June 07, 2024, 10:54 AM

## 2024-06-04 NOTE — Progress Notes (Signed)
 "  NAME:  Philip Richardson, MRN:  [id number], DOB:  28-Jun-1955, LOS: 9 ADMISSION DATE:  05/21/2024, CONSULTATION DATE:  12/20 REFERRING MD:  Bensimhon, AHF CHIEF COMPLAINT:  cardiogenic shock    History of Present Illness:  69 year old male with past medical history of poorly controlled T2DM, GERD, HTN, HLD, CKD IIIb admitted 12/18 after presenting as code STEMI. Began having chest pain on Sunday (12/14) which resolved and then recurred.   Cath showed LAD occlusion tx with DES x 3 + IABP due to high LVEDP.  Persistent pain and cardiogenic shock post cath .  Overnight progressive shock so taken to cath lab.  IABP placed but no improvement so cannulated for VA ECMO.   PCCM consulted to assist with management.  Pertinent  Medical History  DM2 HTN HLD CKD  Significant Hospital Events: Including procedures, antibiotic start and stop dates in addition to other pertinent events   12/18-DES x 3 IABP 12/19 RHC: On NE 15, milrinone  0.375 and VP 0.02 Findings: Ao =81/58 (65) RA = 12 RV = 17/14 PA =  24/18 (20) PCW = 22 Fick cardiac output/index = 4.4/2.1 Thermo CO/CI =4.6/2.2 SVR = 770  Ao sat = 99% PA sat = 60%, 62% PAPi = 0.5 ECMO cannulation; not impella candidate d/t LV clot so using IABP to vent LV 12/21 Flow at 4.7lpm, sweep 0.6; lactate cleared. Bedside POCUS 20-25% CVP 7. Back in NSR. Pressor requirements much improvd. Only on epi. Had been on NE, VPA and methylene blue  12/22 remain on [place]  ECMO with flow 3.5 L, on IABP 1:1.  Came off of pressors overnight.  LDH is trending up.  Remained in sinus rhythm 12/23 came off of vasopressor supports> restarted after DBA trial, remained on VA ECMO with 3.5 L flow, tolerated coming down on VA ECMO weaning to 1.5 12/24 A-fib with controlled rate, started on amiodarone . Improved pressors  12/26 new DKA after steroids> insulin  drip.  Decannulated from [facility] ECMO with impella & inotropic support.      Interim History / Subjective:  Dramatic increase in  pressor requirements overnight and methylene blue . Not tolerating P8 this morning due to low filling pressures in LV, now down to P6. His only complaints is discomfort from his ETT.   Objective   Blood pressure 127/70, pulse 83, temperature (!) 100.8 F (38.2 C), resp. rate 19, height 6' 1 (1.854 m), weight 94.5 kg, SpO2 98%. PAP: (17-43)/(11-26) 23/18 CVP:  [4 mmHg-34 mmHg] 14 mmHg CO:  [3.5 L/min-5.7 L/min] 3.5 L/min CI:  [2.17 L/min/m2-2.6 L/min/m2] 2.17 L/min/m2  Vent Mode: SIMV;PRVC;PSV FiO2 (%):  [40 %] 40 % Set Rate:  [20 bmp] 20 bmp Vt Set:  [640 mL] 640 mL PEEP:  [5 cmH20] 5 cmH20 Pressure Support:  [10 cmH20] 10 cmH20 Plateau Pressure:  [18 cmH20-23 cmH20] 18 cmH20   Intake/Output Summary (Last 24 hours) at 05/31/2024 0959 Last data filed at May 31, 2024 0900 Gross per 24 hour  Intake 4981.06 ml  Output 1058 ml  Net 3923.06 ml   Filed Weights   05/28/24 0530 05/29/24 0540 05/31/24 0500  Weight: 89.9 kg 91.7 kg 94.5 kg    Examination: General chronically ill appearing man lying in bed in NAD HEENT [place]/AT, eyes anicteric, ETT pulmonary: Breathing comfortably on MV. Scattered rhonchi, improving with suctioning. Thin secretions.  Cardiac S1S2, minimal pulsatility on Aline. Pericardial chest tube.  Abdomen mildly distended Extremities warm, well perfused Neuro RASS -1, following commands x 4 extremities GU : green urine in foley  7.43/27/131/18  Bicarb 20, AG 12 BUN 62 Cr 2.31 LA 1.0 WBC 15.4 H/H 8.3/24.8 Platelets 103 INR 1.8 Fibrinogen  433 BG 120-160s  Resolved Hospital Problem list     Assessment & Plan:  Coronary artery disease with late presenting anterior STEMI, s/p overlapping DES x 3 Acute HFrEF due to ischemic cardiomyopathy with cardiogenic shock s/p post IABP 12/19 and VA ECMO cannulation on 12/20  Now impella 5.5 Post MI pericarditis LV thrombus seen on cMR, but TEE was done on 12/24 w/o LV thrombus  Pericardial effusion s/p pericardial  window Paroxysmal A-fib with RVR Poorly controlled diabetes type 2 AKI on CKD3b w/ anion gap metabolic acidosis  TIA Acute respiratory failure with hypoxia due to acute pulmonary edema Elevated LFTs, secondary to shock liver and hemolysis ICU Delirium ABLA expected post cannulation Thrombocytopenia critical illness Debility   --Vasopressors able to wean down aggressively today --SAT & SBT> passed, able to be extubated to [place]. -worked with PT, SLP --Once extubated he requested to be comfort-only care and wanted to withdraw aggressive care measures. See documentation per Palliative and AHF. He passed away with family at bedside.    This patient is critically ill with multiple organ system failure which requires frequent high complexity decision making, assessment, support, evaluation, and titration of therapies. This was completed through the application of advanced monitoring technologies and extensive interpretation of multiple databases. During this encounter critical care time was devoted to patient care services described in this note for 52 minutes.  Leita SHAUNNA Gaskins, DO 06/09/24 6:53 PM [facility]  For contact information, see Amion. If no response to pager, please call PCCM consult pager. After hours, 7PM- 7AM, please call Elink.  "

## 2024-06-04 NOTE — Evaluation (Signed)
 Physical Therapy Re-Evaluation Patient Details Name: Philip Richardson MRN: [id number] DOB: [birth year] Today's Date: June 26, 2024  History of Present Illness  69 yo admitted 12/18 after presenting as code STEMI. Persistent pain and cardiogenic shock post cath . IABP placed;  cannulated for VA ECMO 12/20-12/26.  Probable LV thrombus. Extubated 12/27. PMH:T2DM, GERD, HTN, HLD, CKD IIIb   Clinical Impression  Pt is now ECMO decannulated and pt is extubated. Pt is currently displaying difficulty opening his eyes, needing assistance to do so initially before he can continue to keep them open or open them on his own. When his eyes are closed and he has difficulty opening them he is also maintaining a vertical gaze, needing an object placed in his direct line of site to track in order to get his eyes to look down and then continue to track around the room. He tracks bil but did not vertical downbeating nystagmus throughout. His weakness appears symmetrical in his legs. He does tend to rest with his tongue slightly to the R, but is able to stick it out and push it side to side when cued. Overall, pt seems fairly cognitively intact and is following cues well. He displays deficits in balance, power, activity tolerance, and generalized strength. Due to his recent instability with his vitals, limited session today to just placing pt with bed in egress and having him try to sit his trunk up from there. He needed modAx2 to lean his trunk anteriorly. He could benefit from intensive inpatient rehab, > 3 hours/day, as pt has had a drastic functional decline. Will continue to follow acutely.    Of note, PT orders discontinued later after session before the note could be written due to pt reportedly opting to go comfort care. Please re-consult PT if GOC change.        If plan is discharge home, recommend the following: Help with stairs or ramp for entrance;Two people to help with walking and/or transfers;Two people to help  with bathing/dressing/bathroom;Assistance with cooking/housework;Direct supervision/assist for medications management;Direct supervision/assist for financial management   Can travel by private vehicle        Equipment Recommendations Other (comment) (defer until can assess OOB mobility)  Recommendations for Other Services  Rehab consult    Functional Status Assessment Patient has had a recent decline in their functional status and demonstrates the ability to make significant improvements in function in a reasonable and predictable amount of time.     Precautions / Restrictions Precautions Precautions: Fall Recall of Precautions/Restrictions: Impaired Precaution/Restrictions Comments: swanz, chest tube, impella, MAP goal > 70 Restrictions Weight Bearing Restrictions Per Provider Order: No      Mobility  Bed Mobility Overal bed mobility: Needs Assistance Bed Mobility: Supine to Sit     Supine to sit: Mod assist, +2 for physical assistance, +2 for safety/equipment, HOB elevated, Used rails     General bed mobility comments: Bed placed in egress for pt safety to monitor his vitals closely with first time mobilizing since pt's vitals have been unstable in the past. Cued pt to use core to ascend trunk off elevated HOB, needing modAx2 to lift trunk, x2-3 reps.    Transfers                   General transfer comment: deferred for safety    Ambulation/Gait               General Gait Details: deferred for safety  Stairs  Wheelchair Mobility     Tilt Bed    Modified Rankin (Stroke Patients Only)       Balance Overall balance assessment: Needs assistance Sitting-balance support: Single extremity supported Sitting balance-Leahy Scale: Poor Sitting balance - Comments: In egress min assist to maintain chest off bed       Standing balance comment: deferred                             Pertinent Vitals/Pain Pain  Assessment Pain Assessment: Faces Faces Pain Scale: Hurts little more Pain Location: generalized, legs Pain Descriptors / Indicators: Discomfort, Restless, Sore Pain Intervention(s): Limited activity within patient's tolerance, Monitored during session, Repositioned    Home Living Family/patient expects to be discharged to:: Private residence Living Arrangements: Spouse/significant other Available Help at Discharge: Family;Available 24 hours/day Type of Home: House Home Access: Stairs to enter Entrance Stairs-Rails: Left Entrance Stairs-Number of Steps: 2   Home Layout: One level Home Equipment: None Additional Comments: no glasses    Prior Function Prior Level of Function : Independent/Modified Independent;Working/employed;Driving             Mobility Comments: independent, no falls, bowls for activity ADLs Comments: independent, drives cars for auction; enjoys [employer]     Extremity/Trunk Assessment   Upper Extremity Assessment Upper Extremity Assessment: Defer to OT evaluation    Lower Extremity Assessment Lower Extremity Assessment: Generalized weakness;RLE deficits/detail;LLE deficits/detail RLE Deficits / Details: 2+ hip flexion, 4 knee extension, 4 ankle dorsiflexion MMT; denied numbness/tingling bil; no clonus or babinski noted; WFL PROM; pitting edema noted LLE Deficits / Details: 2+ hip flexion, 4 knee extension, 4 ankle dorsiflexion MMT; denied numbness/tingling bil; no clonus or babinski noted; WFL PROM; pitting edema noted       Communication   Communication Communication: Impaired Factors Affecting Communication: Reduced clarity of speech    Cognition Arousal: Lethargic Behavior During Therapy: WFL for tasks assessed/performed   PT - Cognitive impairments: Attention                       PT - Cognition Comments: Thought it was the 26th but pretty close. Pt with difficulty keeping eyes open and following cues to keep head still while  moving eyes to track instead. Otherwise followed simple cues consistently. Questionable eyelid apraxia with difficulty initially opening unless physically assisted open then pt able to open independently until he fatigued again and process restarted. Needed assistance to initiate eye movement down as pt usually kept superior gaze until pt found a target to track down then could keep eyes down to track in all directions until he fatigued again Following commands: Impaired Following commands impaired: Follows multi-step commands with increased time     Cueing Cueing Techniques: Verbal cues, Visual cues, Tactile cues     General Comments General comments (skin integrity, edema, etc.): RN present throughout to monitor vitals, MAP in 60s for ~2 min then returned to 70s-80s; noted downbeating vertical nystagmus with tracking in all directions; groin sites stable pre and post mobility    Exercises General Exercises - Lower Extremity Long Arc Quad: AROM, Both, 5 reps, Seated   Assessment/Plan    PT Assessment Patient needs continued PT services  PT Problem List Cardiopulmonary status limiting activity;Decreased strength;Decreased activity tolerance;Decreased balance;Decreased mobility       PT Treatment Interventions DME instruction;Gait training;Stair training;Functional mobility training;Therapeutic activities;Therapeutic exercise;Balance training;Neuromuscular re-education;Patient/family education    PT Goals (Current goals  can be found in the Care Plan section)  Acute Rehab PT Goals Patient Stated Goal: to get moving PT Goal Formulation: With patient Time For Goal Achievement: 06/13/24 Potential to Achieve Goals: Fair    Frequency Min 3X/week     Co-evaluation   Reason for Co-Treatment: Complexity of the patient's impairments (multi-system involvement);For patient/therapist safety;To address functional/ADL transfers PT goals addressed during session: Mobility/safety with  mobility;Strengthening/ROM;Balance OT goals addressed during session: ADL's and self-care       AM-PAC PT 6 Clicks Mobility  Outcome Measure Help needed turning from your back to your side while in a flat bed without using bedrails?: Total Help needed moving from lying on your back to sitting on the side of a flat bed without using bedrails?: Total Help needed moving to and from a bed to a chair (including a wheelchair)?: Total Help needed standing up from a chair using your arms (e.g., wheelchair or bedside chair)?: Total Help needed to walk in hospital room?: Total Help needed climbing 3-5 steps with a railing? : Total 6 Click Score: 6    End of Session Equipment Utilized During Treatment: Oxygen Activity Tolerance: Treatment limited secondary to medical complications (Comment) (limited to ensure vitals stability considering the recent instability) Patient left: in bed;with call bell/phone within reach;with bed alarm set Nurse Communication: Mobility status (RN present and monitoring vitals throughout) PT Visit Diagnosis: Muscle weakness (generalized) (M62.81);Difficulty in walking, not elsewhere classified (R26.2)    Time: 8572-8498 PT Time Calculation (min) (ACUTE ONLY): 34 min   Charges:   PT Evaluation $PT Re-evaluation: 1 Re-eval   PT General Charges $$ ACUTE PT VISIT: 1 Visit         Theo Ferretti, PT, DPT Acute Rehabilitation Services  Office: [phone]   Theo CHRISTELLA Ferretti 2024/06/28, 4:25 PM

## 2024-06-04 DEATH — deceased

## 2024-07-05 NOTE — Death Summary Note (Addendum)
 "  Advanced Heart Failure Death Summary  Death Summary   Patient ID: Philip Richardson MRN: [id number], DOB/AGE: Oct 01, 1955 69 y.o. Admit date: 05/21/2024 D/C date:     06/09/2024   Primary Discharge Diagnoses:  Acute MI  Cardiogenic Shock w/ Biventricular Failure c/b Vasoplegic Shock, SIRS Late Presenting Anterior STEMI/CAD, s/p LAD PCI  Acute Hypoxic Respiratory Failure   S/p ECMO w/ IABP Vent  S/p Impella 5.5  Pericardial Effusion w/ Tamponade, S/p Pericardial Window Atrial Fibrillation w/ RVR AKI on CKD IIIb d/t ATN  TIA      Hospital Course:   Henrry Richardson is a 70 y.o. male with history of poorly controlled DM II, HTN, HLD and CKD IIIb. Admitted 05/21/24 with late presenting anterior STEMI and acute systolic CHF/ acute MI cardiogenic shock. Cath was done showing occluded LAD that was treated with overlapping DES x 3. There was residual 80% D1 managed medically. Initial Echo EF 35-40% with peri-apical akinesis, possible apical thrombus noted on non-enhanced images, RV poorly visualized. cMRI EF 30%, moderate post-infarct effusion without tamponade, + LV clot. Treated w/ ASA, Plavix  + heparin  gtt. Milrinone  initiated for low output and to assist w/ diuresis. On 12/19, he developed progressive HF/shock with worsening co-ox and rising lactic acid despite increasing pressor support, now on increasing doses of Milrinone  + addition of NE + VP.  He was taken back to cath lab for RHC to assess hemodynamics to determine need for escalation to MCS. RHC on NE 15, milrinone  0.375 and VP 0.02 c/w severe biventricular failure + ongoing shock, though better than expected w/ CI > 2.0 (RA 12, PA 24/18, PCW 22, FICK CI 2.1, TD CI 2.2, SVR 770, PAPi 0.5). Inotropes adjusted. ECMO deferred. IABP placed (Unable to place Impella due to LV clot). Pt returned to the CCU but later became more hypotensive and acidotic despite IABP and high dose pressor support.  Now on NE 40 epi 5, VP 0.03 and DBA 5 with MAPs 50-60s and  lactate 11.  After GOC discussion, it was decided to proceed w/ VA ECMO support. Cannulated for VA ECMO on 12/20. IABP maintained for LV venting. He developed severe SIRS response initially with VA ECMO but improved w/ methylene blue  and adjustment of drips. Course further c/b Afib w/ RVR requiring amiodarone  gtt, but ultimately converted back to NSR. Also w/ AKI d/t ATN. Nephrology was consulted and followed to assess needs for CRRT, but pt remained nonoliguric. Code stroke was also called on 12/22 but head CT was negative. Symptoms resolved and felt to be TIA.   He was able to come off pressors briefly and lactic acid had cleared but he failed numerous attempts at ECMO wean. He did not tolerate afterload reduction with milrinone  and /or DBA. Subsequent TEE 12/24 showed EF < 20%, RV severely down. No apical clot. Given resolution of LV thrombus, he was taken to the OR for placement of an Impella 5.5 and removal of IABP to help facilitate ECMO wean.  Prior to ECMO decannulation, bedside echo was performed and demonstrated a significant sized pericardial effusion w/ evidence of RV compression. Was taken back to OR by CT surgery for subxyphoid pericardial window to drain the effusion just prior to ECMO decannulation. Prior to decannulation, attempted weaning trial again and hemodynamics were ok. Underwent ECMO decannulation on 12/26, assisted by VVS w/ direct repair of the left common femoral artery + direct repair of left superficial femoral artery. Continued w/ Impella 5.5 support.   Unfortunately on 12/27, he  developed SIRS requiring escalation of pressor support including administration of methylene blue . Palliative care was consulted. Pt was awake and oriented and able to participate in GOC discussion. He expressed desire to terminate aggressive lifesaving efforts and to transition to full comfort care. Conversation witnessed by Palliative Team as well as patient's sister and brother-in-law. Support was  withdrawn and comfort gtts initiated.   Patient passed 06/25/2024. TOD 1702.    Significant Diagnostic Studies LHC 12/18 total occlusion of the proximal LAD.  Treated with 3 overlapping drug-eluting stents. Residual high-grade 80% stenosis of the proximal diagonal post PCI. Mil nonobstructive plaquing in the left main, intermediate branch, left circumflex, and RCA.  2D echo 12/18 EF 35-40%. With peri-apical akinesis, possible apical thrombus noted on non-enhanced images, RV poorly visualized.  cMRI 12/19  cMRI EF 30% moderate post-infarct effusion without tamponade + LV clot.  RHC 12/19 (on NE 15, milrinone  0.375 and VP 0.02) (RA 12, PA 24/18, PCW 22, FICK CI 2.1, TD CI 2.2, SVR 770, PAPi 0.5).  TEE 12/24 EF < 20%, RV severely down. No apical clot.   Consultations  Interventional Cardiology Advanced Heart Failure Pulmonary Critical Care Medicine CT Surgery  Vein & Vascular Surgery  Nephrology  Neurology  Palliative Care    Duration of Discharge Encounter: Greater than 35 minutes   Signed, Caffie Shed, PA-C 06/09/2024, 2:26 PM   Agree with above.   Toribio Fuel, MD  10:32 PM    "

## 2024-07-17 ENCOUNTER — Ambulatory Visit: Admitting: Nurse Practitioner

## 2024-07-27 ENCOUNTER — Ambulatory Visit: Admitting: Nurse Practitioner

## 2025-02-02 ENCOUNTER — Ambulatory Visit: Payer: Self-pay
